# Patient Record
Sex: Female | Born: 1945 | Race: Black or African American | Hispanic: No | Marital: Married | Smoking: Former smoker
Health system: Southern US, Community
[De-identification: ages and names within clinical notes are randomized; demographics above are authoritative.]

## PROBLEM LIST (undated history)

## (undated) DIAGNOSIS — R42 Dizziness and giddiness: Secondary | ICD-10-CM

## (undated) DIAGNOSIS — H409 Unspecified glaucoma: Secondary | ICD-10-CM

## (undated) DIAGNOSIS — I340 Nonrheumatic mitral (valve) insufficiency: Secondary | ICD-10-CM

## (undated) DIAGNOSIS — K449 Diaphragmatic hernia without obstruction or gangrene: Secondary | ICD-10-CM

## (undated) DIAGNOSIS — K219 Gastro-esophageal reflux disease without esophagitis: Secondary | ICD-10-CM

## (undated) DIAGNOSIS — F411 Generalized anxiety disorder: Secondary | ICD-10-CM

## (undated) DIAGNOSIS — E785 Hyperlipidemia, unspecified: Secondary | ICD-10-CM

## (undated) DIAGNOSIS — I2510 Atherosclerotic heart disease of native coronary artery without angina pectoris: Secondary | ICD-10-CM

## (undated) DIAGNOSIS — I255 Ischemic cardiomyopathy: Secondary | ICD-10-CM

## (undated) DIAGNOSIS — I779 Disorder of arteries and arterioles, unspecified: Secondary | ICD-10-CM

## (undated) DIAGNOSIS — E119 Type 2 diabetes mellitus without complications: Secondary | ICD-10-CM

## (undated) DIAGNOSIS — D649 Anemia, unspecified: Secondary | ICD-10-CM

## (undated) DIAGNOSIS — I2699 Other pulmonary embolism without acute cor pulmonale: Secondary | ICD-10-CM

## (undated) DIAGNOSIS — M1990 Unspecified osteoarthritis, unspecified site: Secondary | ICD-10-CM

## (undated) DIAGNOSIS — M2550 Pain in unspecified joint: Secondary | ICD-10-CM

## (undated) DIAGNOSIS — J45909 Unspecified asthma, uncomplicated: Secondary | ICD-10-CM

## (undated) DIAGNOSIS — I10 Essential (primary) hypertension: Secondary | ICD-10-CM

## (undated) DIAGNOSIS — I4891 Unspecified atrial fibrillation: Secondary | ICD-10-CM

## (undated) DIAGNOSIS — I9789 Other postprocedural complications and disorders of the circulatory system, not elsewhere classified: Secondary | ICD-10-CM

## (undated) DIAGNOSIS — I739 Peripheral vascular disease, unspecified: Secondary | ICD-10-CM

## (undated) DIAGNOSIS — I519 Heart disease, unspecified: Secondary | ICD-10-CM

## (undated) HISTORY — PX: ROTATOR CUFF REPAIR: SHX139

## (undated) HISTORY — PX: EYE SURGERY: SHX253

## (undated) HISTORY — DX: Essential (primary) hypertension: I10

## (undated) HISTORY — PX: SHOULDER SURGERY: SHX246

## (undated) HISTORY — DX: Unspecified atrial fibrillation: I48.91

## (undated) HISTORY — DX: Diaphragmatic hernia without obstruction or gangrene: K44.9

## (undated) HISTORY — DX: Generalized anxiety disorder: F41.1

## (undated) HISTORY — DX: Atherosclerotic heart disease of native coronary artery without angina pectoris: I25.10

## (undated) HISTORY — DX: Heart disease, unspecified: I51.9

## (undated) HISTORY — DX: Disorder of arteries and arterioles, unspecified: I77.9

## (undated) HISTORY — DX: Other postprocedural complications and disorders of the circulatory system, not elsewhere classified: I97.89

## (undated) HISTORY — PX: TOTAL KNEE ARTHROPLASTY: SHX125

## (undated) HISTORY — PX: VAGINAL HYSTERECTOMY: SUR661

## (undated) HISTORY — DX: Ischemic cardiomyopathy: I25.5

## (undated) HISTORY — DX: Anemia, unspecified: D64.9

## (undated) HISTORY — DX: Type 2 diabetes mellitus without complications: E11.9

## (undated) HISTORY — DX: Peripheral vascular disease, unspecified: I73.9

## (undated) HISTORY — PX: HERNIA REPAIR: SHX51

---

## 2001-07-20 ENCOUNTER — Encounter: Payer: Self-pay | Admitting: Orthopedic Surgery

## 2001-07-22 ENCOUNTER — Inpatient Hospital Stay (HOSPITAL_COMMUNITY): Admission: RE | Admit: 2001-07-22 | Discharge: 2001-07-26 | Payer: Self-pay | Admitting: Orthopedic Surgery

## 2001-07-22 ENCOUNTER — Encounter: Payer: Self-pay | Admitting: Orthopedic Surgery

## 2003-01-31 ENCOUNTER — Encounter: Admission: RE | Admit: 2003-01-31 | Discharge: 2003-01-31 | Payer: Self-pay | Admitting: Orthopedic Surgery

## 2004-04-04 ENCOUNTER — Ambulatory Visit: Payer: Self-pay | Admitting: Orthopedic Surgery

## 2010-04-14 ENCOUNTER — Encounter: Payer: Self-pay | Admitting: Orthopedic Surgery

## 2012-09-11 ENCOUNTER — Encounter: Payer: Self-pay | Admitting: Cardiovascular Disease

## 2012-09-11 DIAGNOSIS — R079 Chest pain, unspecified: Secondary | ICD-10-CM

## 2012-09-13 ENCOUNTER — Encounter: Payer: Self-pay | Admitting: Cardiovascular Disease

## 2012-09-13 DIAGNOSIS — R072 Precordial pain: Secondary | ICD-10-CM

## 2012-09-13 DIAGNOSIS — I498 Other specified cardiac arrhythmias: Secondary | ICD-10-CM

## 2012-09-14 ENCOUNTER — Other Ambulatory Visit: Payer: Self-pay | Admitting: *Deleted

## 2012-09-14 ENCOUNTER — Encounter: Payer: Self-pay | Admitting: *Deleted

## 2012-09-14 DIAGNOSIS — R001 Bradycardia, unspecified: Secondary | ICD-10-CM

## 2012-09-14 DIAGNOSIS — R55 Syncope and collapse: Secondary | ICD-10-CM

## 2012-09-18 DIAGNOSIS — R55 Syncope and collapse: Secondary | ICD-10-CM

## 2012-09-18 DIAGNOSIS — I498 Other specified cardiac arrhythmias: Secondary | ICD-10-CM

## 2012-09-23 DIAGNOSIS — I059 Rheumatic mitral valve disease, unspecified: Secondary | ICD-10-CM

## 2012-10-21 ENCOUNTER — Encounter: Payer: No Typology Code available for payment source | Admitting: Physician Assistant

## 2012-11-24 ENCOUNTER — Ambulatory Visit: Payer: No Typology Code available for payment source | Admitting: Cardiovascular Disease

## 2014-03-25 HISTORY — PX: BACK SURGERY: SHX140

## 2014-04-21 DIAGNOSIS — Z23 Encounter for immunization: Secondary | ICD-10-CM | POA: Diagnosis not present

## 2014-05-23 DIAGNOSIS — I10 Essential (primary) hypertension: Secondary | ICD-10-CM | POA: Diagnosis not present

## 2014-05-23 DIAGNOSIS — M791 Myalgia: Secondary | ICD-10-CM | POA: Diagnosis not present

## 2014-05-23 DIAGNOSIS — E119 Type 2 diabetes mellitus without complications: Secondary | ICD-10-CM | POA: Diagnosis not present

## 2014-05-23 DIAGNOSIS — Z0000 Encounter for general adult medical examination without abnormal findings: Secondary | ICD-10-CM | POA: Diagnosis not present

## 2014-05-23 DIAGNOSIS — Z1389 Encounter for screening for other disorder: Secondary | ICD-10-CM | POA: Diagnosis not present

## 2014-05-25 DIAGNOSIS — E119 Type 2 diabetes mellitus without complications: Secondary | ICD-10-CM | POA: Diagnosis not present

## 2014-05-25 DIAGNOSIS — I10 Essential (primary) hypertension: Secondary | ICD-10-CM | POA: Diagnosis not present

## 2014-06-27 DIAGNOSIS — R1013 Epigastric pain: Secondary | ICD-10-CM | POA: Diagnosis not present

## 2014-06-27 DIAGNOSIS — M1990 Unspecified osteoarthritis, unspecified site: Secondary | ICD-10-CM | POA: Diagnosis not present

## 2014-06-27 DIAGNOSIS — Z96652 Presence of left artificial knee joint: Secondary | ICD-10-CM | POA: Diagnosis not present

## 2014-06-27 DIAGNOSIS — I10 Essential (primary) hypertension: Secondary | ICD-10-CM | POA: Diagnosis not present

## 2014-06-27 DIAGNOSIS — R109 Unspecified abdominal pain: Secondary | ICD-10-CM | POA: Diagnosis not present

## 2014-06-27 DIAGNOSIS — Z79899 Other long term (current) drug therapy: Secondary | ICD-10-CM | POA: Diagnosis not present

## 2014-06-27 DIAGNOSIS — K219 Gastro-esophageal reflux disease without esophagitis: Secondary | ICD-10-CM | POA: Diagnosis not present

## 2014-06-27 DIAGNOSIS — R110 Nausea: Secondary | ICD-10-CM | POA: Diagnosis not present

## 2014-06-27 DIAGNOSIS — R197 Diarrhea, unspecified: Secondary | ICD-10-CM | POA: Diagnosis not present

## 2014-06-27 DIAGNOSIS — Z87891 Personal history of nicotine dependence: Secondary | ICD-10-CM | POA: Diagnosis not present

## 2014-06-27 DIAGNOSIS — E119 Type 2 diabetes mellitus without complications: Secondary | ICD-10-CM | POA: Diagnosis not present

## 2014-06-27 DIAGNOSIS — R42 Dizziness and giddiness: Secondary | ICD-10-CM | POA: Diagnosis not present

## 2014-06-27 DIAGNOSIS — R0602 Shortness of breath: Secondary | ICD-10-CM | POA: Diagnosis not present

## 2014-07-22 DIAGNOSIS — E119 Type 2 diabetes mellitus without complications: Secondary | ICD-10-CM | POA: Diagnosis not present

## 2014-07-22 DIAGNOSIS — I10 Essential (primary) hypertension: Secondary | ICD-10-CM | POA: Diagnosis not present

## 2014-07-22 DIAGNOSIS — M545 Low back pain: Secondary | ICD-10-CM | POA: Diagnosis not present

## 2014-07-27 DIAGNOSIS — M4806 Spinal stenosis, lumbar region: Secondary | ICD-10-CM | POA: Diagnosis not present

## 2014-07-27 DIAGNOSIS — M9974 Connective tissue and disc stenosis of intervertebral foramina of sacral region: Secondary | ICD-10-CM | POA: Diagnosis not present

## 2014-07-27 DIAGNOSIS — M4608 Spinal enthesopathy, sacral and sacrococcygeal region: Secondary | ICD-10-CM | POA: Diagnosis not present

## 2014-07-27 DIAGNOSIS — M5136 Other intervertebral disc degeneration, lumbar region: Secondary | ICD-10-CM | POA: Diagnosis not present

## 2014-07-27 DIAGNOSIS — M5116 Intervertebral disc disorders with radiculopathy, lumbar region: Secondary | ICD-10-CM | POA: Diagnosis not present

## 2014-07-27 DIAGNOSIS — M47816 Spondylosis without myelopathy or radiculopathy, lumbar region: Secondary | ICD-10-CM | POA: Diagnosis not present

## 2014-07-27 DIAGNOSIS — M9973 Connective tissue and disc stenosis of intervertebral foramina of lumbar region: Secondary | ICD-10-CM | POA: Diagnosis not present

## 2014-07-27 DIAGNOSIS — M5117 Intervertebral disc disorders with radiculopathy, lumbosacral region: Secondary | ICD-10-CM | POA: Diagnosis not present

## 2014-08-08 DIAGNOSIS — I10 Essential (primary) hypertension: Secondary | ICD-10-CM | POA: Diagnosis not present

## 2014-08-08 DIAGNOSIS — M549 Dorsalgia, unspecified: Secondary | ICD-10-CM | POA: Diagnosis not present

## 2014-08-08 DIAGNOSIS — Z6832 Body mass index (BMI) 32.0-32.9, adult: Secondary | ICD-10-CM | POA: Diagnosis not present

## 2014-08-08 DIAGNOSIS — M5127 Other intervertebral disc displacement, lumbosacral region: Secondary | ICD-10-CM | POA: Diagnosis not present

## 2014-08-10 ENCOUNTER — Other Ambulatory Visit: Payer: Self-pay | Admitting: Neurosurgery

## 2014-08-10 DIAGNOSIS — M5126 Other intervertebral disc displacement, lumbar region: Secondary | ICD-10-CM

## 2014-08-25 ENCOUNTER — Ambulatory Visit
Admission: RE | Admit: 2014-08-25 | Discharge: 2014-08-25 | Disposition: A | Discharge status: Home / Self Care | Payer: Medicare Other | Source: Ambulatory Visit | Attending: Neurosurgery | Admitting: Neurosurgery

## 2014-08-25 DIAGNOSIS — M4316 Spondylolisthesis, lumbar region: Secondary | ICD-10-CM | POA: Diagnosis not present

## 2014-08-25 DIAGNOSIS — M5126 Other intervertebral disc displacement, lumbar region: Secondary | ICD-10-CM

## 2014-08-25 DIAGNOSIS — M47817 Spondylosis without myelopathy or radiculopathy, lumbosacral region: Secondary | ICD-10-CM | POA: Diagnosis not present

## 2014-08-25 DIAGNOSIS — M4317 Spondylolisthesis, lumbosacral region: Secondary | ICD-10-CM | POA: Diagnosis not present

## 2014-08-25 DIAGNOSIS — M5127 Other intervertebral disc displacement, lumbosacral region: Secondary | ICD-10-CM | POA: Diagnosis not present

## 2014-08-25 MED ORDER — DIAZEPAM 5 MG PO TABS
5.0000 mg | ORAL_TABLET | Freq: Once | ORAL | Status: AC
  Administered 2014-08-25: 5 mg via ORAL

## 2014-08-25 MED ORDER — MEPERIDINE HCL 100 MG/ML IJ SOLN
75.0000 mg | Freq: Once | INTRAMUSCULAR | Status: AC
  Administered 2014-08-25: 75 mg via INTRAMUSCULAR

## 2014-08-25 MED ORDER — IOHEXOL 180 MG/ML  SOLN
15.0000 mL | Freq: Once | INTRAMUSCULAR | Status: AC | PRN
  Administered 2014-08-25: 15 mL via INTRATHECAL

## 2014-08-25 MED ORDER — ONDANSETRON HCL 4 MG/2ML IJ SOLN
4.0000 mg | Freq: Once | INTRAMUSCULAR | Status: AC
  Administered 2014-08-25: 4 mg via INTRAMUSCULAR

## 2014-08-25 NOTE — Discharge Instructions (Signed)

## 2014-08-25 NOTE — Progress Notes (Signed)
Karen Tate at Dr. Harley Hallmark office notified patient is on her way from our office after her myelogram to their office, per Dr. Harley Hallmark instructions, to have post-myelo visit/discussion.  They know to find her a place to lie down once she gets there.  Brita Romp, RN

## 2014-09-07 ENCOUNTER — Other Ambulatory Visit: Payer: Self-pay | Admitting: Neurosurgery

## 2014-09-19 ENCOUNTER — Other Ambulatory Visit (HOSPITAL_COMMUNITY): Payer: Medicare Other | Admitting: *Deleted

## 2014-09-19 NOTE — Pre-Procedure Instructions (Addendum)
    Karen Tate  09/19/2014    Your procedure is scheduled on Wednesday, September 28, 2014.   Report to [facility] "A" Admitting Office at 9:30 AM.   Call this number if you have problems the morning of surgery: [phone]   Remember:  Do not eat food or drink liquids after midnight Tuesday, 09/27/14.  Take these medicines the morning of surgery with A SIP OF WATER: Hydralazine, Omeprazole, Amlodipine, Alprazolam, Meclizine (if needed)   STOP Fish Oil, Black Cohosh today   STOP/ Do not take Aspirin, Aleve, Naproxen, Advil, Ibuprofen, Motrin, Vitamins, Herbs, or Supplements starting today   Do not wear jewelry, make-up or nail polish.  Do not wear lotions, powders, or perfumes.  You may wear deodorant.  Do not shave 48 hours prior to surgery.    Do not bring valuables to the hospital.  [facility] is not responsible for any belongings or valuables.  Contacts, dentures or bridgework may not be worn into surgery.  Leave your suitcase in the car.  After surgery it may be brought to your room.  For patients admitted to the hospital, discharge time will be determined by your treatment team.  Special instructions:  See "Preparing for Surgery" Instruction sheet.   Please read over the following fact sheets that you were given. Pain Booklet, Coughing and Deep Breathing, Blood Transfusion Information, MRSA Information and Surgical Site Infection Prevention

## 2014-09-20 ENCOUNTER — Encounter (HOSPITAL_COMMUNITY)
Admission: RE | Admit: 2014-09-20 | Discharge: 2014-09-20 | Disposition: A | Discharge status: Home / Self Care | Payer: Medicare Other | Source: Ambulatory Visit | Attending: Neurosurgery | Admitting: Neurosurgery

## 2014-09-20 ENCOUNTER — Encounter (HOSPITAL_COMMUNITY): Payer: Self-pay

## 2014-09-20 DIAGNOSIS — Z0183 Encounter for blood typing: Secondary | ICD-10-CM | POA: Diagnosis not present

## 2014-09-20 DIAGNOSIS — I498 Other specified cardiac arrhythmias: Secondary | ICD-10-CM | POA: Diagnosis not present

## 2014-09-20 DIAGNOSIS — M4310 Spondylolisthesis, site unspecified: Secondary | ICD-10-CM | POA: Insufficient documentation

## 2014-09-20 DIAGNOSIS — Z01812 Encounter for preprocedural laboratory examination: Secondary | ICD-10-CM | POA: Diagnosis not present

## 2014-09-20 HISTORY — DX: Dizziness and giddiness: R42

## 2014-09-20 HISTORY — DX: Pain in unspecified joint: M25.50

## 2014-09-20 HISTORY — DX: Unspecified glaucoma: H40.9

## 2014-09-20 HISTORY — DX: Hyperlipidemia, unspecified: E78.5

## 2014-09-20 HISTORY — DX: Gastro-esophageal reflux disease without esophagitis: K21.9

## 2014-09-20 HISTORY — DX: Unspecified osteoarthritis, unspecified site: M19.90

## 2014-09-20 LAB — BASIC METABOLIC PANEL
Anion gap: 13 (ref 5–15)
BUN: 15 mg/dL (ref 6–20)
CO2: 24 mmol/L (ref 22–32)
Calcium: 10 mg/dL (ref 8.9–10.3)
Chloride: 102 mmol/L (ref 101–111)
Creatinine, Ser: 1.09 mg/dL — ABNORMAL HIGH (ref 0.44–1.00)
GFR calc Af Amer: 58 mL/min — ABNORMAL LOW (ref >60)
GFR calc non Af Amer: 50 mL/min — ABNORMAL LOW (ref >60)
Glucose, Bld: 111 mg/dL — ABNORMAL HIGH (ref 65–99)
Potassium: 3.9 mmol/L (ref 3.5–5.1)
Sodium: 139 mmol/L (ref 135–145)

## 2014-09-20 LAB — CBC
HCT: 38 % (ref 36.0–46.0)
Hemoglobin: 12.3 g/dL (ref 12.0–15.0)
MCH: 29.1 pg (ref 26.0–34.0)
MCHC: 32.4 g/dL (ref 30.0–36.0)
MCV: 90 fL (ref 78.0–100.0)
Platelets: 340 10*3/uL (ref 150–400)
RBC: 4.22 MIL/uL (ref 3.87–5.11)
RDW: 14.6 % (ref 11.5–15.5)
WBC: 6.3 10*3/uL (ref 4.0–10.5)

## 2014-09-20 LAB — ABO/RH: ABO/RH(D): A POS

## 2014-09-20 LAB — TYPE AND SCREEN
ABO/RH(D): A POS
Antibody Screen: NEGATIVE

## 2014-09-20 LAB — GLUCOSE, CAPILLARY: Glucose-Capillary: 90 mg/dL (ref 65–99)

## 2014-09-20 LAB — SURGICAL PCR SCREEN
MRSA, PCR: NEGATIVE
Staphylococcus aureus: NEGATIVE

## 2014-09-20 NOTE — Progress Notes (Signed)
Patient denies CP, shob, reports cardiac test done at [facility] done 2014 when her heart rate dropped into the 40's determined to be caused by a combination of beta blocker an calcium channel blocker. Records on chart under media tab. Patient reports no further cardiology visit/ concerns. PCP Dr. Sherrie Sport in [place]. Last A1C in early March per office. A1C ordered.

## 2014-09-20 NOTE — Progress Notes (Signed)
Left message with Janett Billow requested that orders be second signed.

## 2014-09-21 LAB — HEMOGLOBIN A1C
Hgb A1c MFr Bld: 6.9 % — ABNORMAL HIGH (ref 4.8–5.6)
Mean Plasma Glucose: 151 mg/dL

## 2014-09-23 DIAGNOSIS — I10 Essential (primary) hypertension: Secondary | ICD-10-CM | POA: Diagnosis not present

## 2014-09-23 DIAGNOSIS — E119 Type 2 diabetes mellitus without complications: Secondary | ICD-10-CM | POA: Diagnosis not present

## 2014-09-23 DIAGNOSIS — E11610 Type 2 diabetes mellitus with diabetic neuropathic arthropathy: Secondary | ICD-10-CM | POA: Diagnosis not present

## 2014-09-23 DIAGNOSIS — M545 Low back pain: Secondary | ICD-10-CM | POA: Diagnosis not present

## 2014-09-27 NOTE — H&P (Signed)
Karen Tate is an 69 y.o. female.   Chief Complaint:numbnee in left leg HPI: patient who after left knee surgery developed numbness of the left leg from the knee down associated with pain and weakness. Conservative treatment has not helped. Rays showed spondylolisthesis at l45 and l5s1.  She wants to go ahead with surgery since she is getting worse  Past Medical History  Diagnosis Date  . Type 2 diabetes mellitus   . Hypertension   . Hiatal hernia   . Anxiety neurosis   . Hyperlipemia   . Dizziness   . Arthritis   . Joint pain   . GERD (gastroesophageal reflux disease)   . Glaucoma     Past Surgical History  Procedure Laterality Date  . Vaginal hysterectomy    . Hernia repair    . Total knee arthroplasty      LEFT  . Shoulder surgery      LEFT    Family History  Problem Relation Age of Onset  . Diabetes Father   . Breast cancer Mother    Social History:  reports that she quit smoking about 36 years ago. Her smoking use included Cigarettes. She has a 20 pack-year smoking history. She does not have any smokeless tobacco history on file. She reports that she does not drink alcohol or use illicit drugs.  Allergies:  Allergies  Allergen Reactions  . Beta Adrenergic Blockers Other (See Comments)    Dropped heart rate to the 40's  . Calcium Channel Blockers Other (See Comments)    Dropped HR into the 40's  . Naproxen Hives    No prescriptions prior to admission    No results found for this or any previous visit (from the past 48 hour(s)). No results found.  Review of Systems  HENT: Negative.   Eyes: Negative.   Respiratory: Negative.   Cardiovascular: Positive for leg swelling.       Artrerial hypertension  Gastrointestinal: Negative.   Genitourinary: Negative.   Musculoskeletal: Positive for back pain.  Skin: Negative.   Neurological: Positive for sensory change and focal weakness.  Endo/Heme/Allergies: Negative.   Psychiatric/Behavioral: Negative.      There were no vitals taken for this visit. Physical Exam hent,nl. Neck, nl. Cv, nl. Lungs, clear. Abdomen,soft. Extremities, nl. NEURO weakness of df and pf left foot. SLR in left positive at 60 degrees. Sensory, numbness between left knee and ankle.  Myelogram of the lumbar areas showed spondylolisthesis at l45, l5-s1.   Assessment/Plan Patient wants to go ahead with surgery which will be decompression and fusion from l4 to s1. She is aware of benefits and risks  Chrystina Naff M 09/27/2014, 11:43 AM

## 2014-09-28 ENCOUNTER — Inpatient Hospital Stay (HOSPITAL_COMMUNITY): Payer: Medicare Other

## 2014-09-28 ENCOUNTER — Inpatient Hospital Stay (HOSPITAL_COMMUNITY)
Admission: RE | Admit: 2014-09-28 | Discharge: 2014-10-02 | DRG: 460 | Disposition: A | Discharge status: Home Health | Payer: Medicare Other | Source: Ambulatory Visit | Attending: Neurosurgery | Admitting: Neurosurgery

## 2014-09-28 ENCOUNTER — Inpatient Hospital Stay (HOSPITAL_COMMUNITY): Payer: Medicare Other | Admitting: Certified Registered Nurse Anesthetist

## 2014-09-28 ENCOUNTER — Encounter (HOSPITAL_COMMUNITY)
Admission: RE | Disposition: A | Discharge status: Home Health | Payer: Medicare Other | Source: Ambulatory Visit | Attending: Neurosurgery

## 2014-09-28 ENCOUNTER — Encounter (HOSPITAL_COMMUNITY): Payer: Self-pay | Admitting: Anesthesiology

## 2014-09-28 DIAGNOSIS — M5137 Other intervertebral disc degeneration, lumbosacral region: Secondary | ICD-10-CM | POA: Diagnosis not present

## 2014-09-28 DIAGNOSIS — Z419 Encounter for procedure for purposes other than remedying health state, unspecified: Secondary | ICD-10-CM

## 2014-09-28 DIAGNOSIS — Z96652 Presence of left artificial knee joint: Secondary | ICD-10-CM | POA: Diagnosis present

## 2014-09-28 DIAGNOSIS — J449 Chronic obstructive pulmonary disease, unspecified: Secondary | ICD-10-CM | POA: Diagnosis present

## 2014-09-28 DIAGNOSIS — K219 Gastro-esophageal reflux disease without esophagitis: Secondary | ICD-10-CM | POA: Diagnosis present

## 2014-09-28 DIAGNOSIS — I10 Essential (primary) hypertension: Secondary | ICD-10-CM | POA: Diagnosis present

## 2014-09-28 DIAGNOSIS — Z87891 Personal history of nicotine dependence: Secondary | ICD-10-CM

## 2014-09-28 DIAGNOSIS — E785 Hyperlipidemia, unspecified: Secondary | ICD-10-CM | POA: Diagnosis present

## 2014-09-28 DIAGNOSIS — M4317 Spondylolisthesis, lumbosacral region: Secondary | ICD-10-CM | POA: Diagnosis present

## 2014-09-28 DIAGNOSIS — M5117 Intervertebral disc disorders with radiculopathy, lumbosacral region: Secondary | ICD-10-CM | POA: Diagnosis present

## 2014-09-28 DIAGNOSIS — M1990 Unspecified osteoarthritis, unspecified site: Secondary | ICD-10-CM | POA: Diagnosis not present

## 2014-09-28 DIAGNOSIS — K5900 Constipation, unspecified: Secondary | ICD-10-CM | POA: Diagnosis not present

## 2014-09-28 DIAGNOSIS — M4316 Spondylolisthesis, lumbar region: Secondary | ICD-10-CM | POA: Diagnosis not present

## 2014-09-28 DIAGNOSIS — R200 Anesthesia of skin: Secondary | ICD-10-CM | POA: Diagnosis not present

## 2014-09-28 DIAGNOSIS — M47816 Spondylosis without myelopathy or radiculopathy, lumbar region: Secondary | ICD-10-CM | POA: Diagnosis not present

## 2014-09-28 DIAGNOSIS — M5417 Radiculopathy, lumbosacral region: Secondary | ICD-10-CM | POA: Diagnosis not present

## 2014-09-28 DIAGNOSIS — M5116 Intervertebral disc disorders with radiculopathy, lumbar region: Secondary | ICD-10-CM | POA: Diagnosis present

## 2014-09-28 DIAGNOSIS — E119 Type 2 diabetes mellitus without complications: Secondary | ICD-10-CM | POA: Diagnosis present

## 2014-09-28 DIAGNOSIS — Z981 Arthrodesis status: Secondary | ICD-10-CM | POA: Diagnosis not present

## 2014-09-28 LAB — GLUCOSE, CAPILLARY
Glucose-Capillary: 132 mg/dL — ABNORMAL HIGH (ref 65–99)
Glucose-Capillary: 188 mg/dL — ABNORMAL HIGH (ref 65–99)

## 2014-09-28 SURGERY — POSTERIOR LUMBAR FUSION 2 LEVEL
Anesthesia: General

## 2014-09-28 MED ORDER — NALOXONE HCL 0.4 MG/ML IJ SOLN: 0.4000 mg | INTRAMUSCULAR | Status: DC | PRN

## 2014-09-28 MED ORDER — ONDANSETRON HCL 4 MG/2ML IJ SOLN
INTRAMUSCULAR | Status: DC | PRN
  Administered 2014-09-28: 4 mg via INTRAVENOUS

## 2014-09-28 MED ORDER — DIAZEPAM 5 MG PO TABS
ORAL_TABLET | ORAL | Status: AC
  Filled 2014-09-28: qty 1

## 2014-09-28 MED ORDER — MENTHOL 3 MG MT LOZG: 1.0000 | LOZENGE | OROMUCOSAL | Status: DC | PRN

## 2014-09-28 MED ORDER — ROCURONIUM BROMIDE 100 MG/10ML IV SOLN
INTRAVENOUS | Status: DC | PRN
  Administered 2014-09-28: 50 mg via INTRAVENOUS

## 2014-09-28 MED ORDER — INSULIN ASPART 100 UNIT/ML ~~LOC~~ SOLN: 0.0000 [IU] | Freq: Every day | SUBCUTANEOUS | Status: DC

## 2014-09-28 MED ORDER — PROMETHAZINE HCL 25 MG/ML IJ SOLN
6.2500 mg | INTRAMUSCULAR | Status: DC | PRN
Start: 2014-09-28 — End: 2014-09-28

## 2014-09-28 MED ORDER — AMLODIPINE BESYLATE 5 MG PO TABS
5.0000 mg | ORAL_TABLET | Freq: Every day | ORAL | Status: DC
  Administered 2014-09-29 – 2014-10-02 (×4): 5 mg via ORAL
  Filled 2014-09-28 (×4): qty 1

## 2014-09-28 MED ORDER — ARTIFICIAL TEARS OP OINT
TOPICAL_OINTMENT | OPHTHALMIC | Status: AC
  Filled 2014-09-28: qty 3.5

## 2014-09-28 MED ORDER — HYDRALAZINE HCL 25 MG PO TABS
25.0000 mg | ORAL_TABLET | Freq: Three times a day (TID) | ORAL | Status: DC
Start: 2014-09-28 — End: 2014-10-02
  Administered 2014-09-28 – 2014-10-02 (×11): 25 mg via ORAL
  Filled 2014-09-28 (×11): qty 1

## 2014-09-28 MED ORDER — PHENYLEPHRINE HCL 10 MG/ML IJ SOLN
10.0000 mg | INTRAVENOUS | Status: DC | PRN
  Administered 2014-09-28: 10 ug/min via INTRAVENOUS

## 2014-09-28 MED ORDER — HYDROMORPHONE HCL 1 MG/ML IJ SOLN
INTRAMUSCULAR | Status: AC
  Administered 2014-09-28: 0.5 mg via INTRAVENOUS
  Filled 2014-09-28: qty 1

## 2014-09-28 MED ORDER — VANCOMYCIN HCL 1000 MG IV SOLR
INTRAVENOUS | Status: AC
  Filled 2014-09-28: qty 2000

## 2014-09-28 MED ORDER — CEFAZOLIN SODIUM 1-5 GM-% IV SOLN
1.0000 g | Freq: Three times a day (TID) | INTRAVENOUS | Status: AC
  Administered 2014-09-28 – 2014-09-29 (×2): 1 g via INTRAVENOUS
  Filled 2014-09-28 (×2): qty 50

## 2014-09-28 MED ORDER — DIPHENHYDRAMINE HCL 50 MG/ML IJ SOLN: 12.5000 mg | Freq: Four times a day (QID) | INTRAMUSCULAR | Status: DC | PRN

## 2014-09-28 MED ORDER — FENTANYL CITRATE (PF) 250 MCG/5ML IJ SOLN
INTRAMUSCULAR | Status: AC
  Filled 2014-09-28: qty 5

## 2014-09-28 MED ORDER — HYDROMORPHONE HCL 1 MG/ML IJ SOLN
0.2500 mg | INTRAMUSCULAR | Status: DC | PRN
  Administered 2014-09-28 (×4): 0.5 mg via INTRAVENOUS

## 2014-09-28 MED ORDER — VECURONIUM BROMIDE 10 MG IV SOLR
INTRAVENOUS | Status: AC
  Filled 2014-09-28: qty 10

## 2014-09-28 MED ORDER — VANCOMYCIN HCL 1000 MG IV SOLR
INTRAVENOUS | Status: DC | PRN
  Administered 2014-09-28: 2000 mg via TOPICAL

## 2014-09-28 MED ORDER — HYDROCHLOROTHIAZIDE 25 MG PO TABS
25.0000 mg | ORAL_TABLET | Freq: Every day | ORAL | Status: DC
  Administered 2014-09-29 – 2014-10-02 (×4): 25 mg via ORAL
  Filled 2014-09-28 (×4): qty 1

## 2014-09-28 MED ORDER — MORPHINE SULFATE 1 MG/ML IV SOLN
INTRAVENOUS | Status: DC
  Administered 2014-09-28: 1.5 mg via INTRAVENOUS
  Administered 2014-09-28: 19:00:00 via INTRAVENOUS
  Administered 2014-09-29: 0 mg via INTRAVENOUS
  Administered 2014-09-29: 9 mg via INTRAVENOUS
  Administered 2014-09-29: 0 mg via INTRAVENOUS
  Administered 2014-09-29: 1.5 mg via INTRAVENOUS
  Administered 2014-09-30: 0 mg via INTRAVENOUS
  Filled 2014-09-28: qty 25

## 2014-09-28 MED ORDER — PROPOFOL 10 MG/ML IV BOLUS
INTRAVENOUS | Status: DC | PRN
  Administered 2014-09-28: 150 mg via INTRAVENOUS

## 2014-09-28 MED ORDER — THROMBIN 20000 UNITS EX SOLR
CUTANEOUS | Status: DC | PRN
  Administered 2014-09-28: 14:00:00 via TOPICAL

## 2014-09-28 MED ORDER — 0.9 % SODIUM CHLORIDE (POUR BTL) OPTIME
TOPICAL | Status: DC | PRN
  Administered 2014-09-28: 1000 mL

## 2014-09-28 MED ORDER — PHENOL 1.4 % MT LIQD: 1.0000 | OROMUCOSAL | Status: DC | PRN

## 2014-09-28 MED ORDER — FENTANYL CITRATE (PF) 100 MCG/2ML IJ SOLN
INTRAMUSCULAR | Status: DC | PRN
  Administered 2014-09-28: 200 ug via INTRAVENOUS
  Administered 2014-09-28: 150 ug via INTRAVENOUS
  Administered 2014-09-28: 100 ug via INTRAVENOUS
  Administered 2014-09-28: 50 ug via INTRAVENOUS

## 2014-09-28 MED ORDER — LIDOCAINE HCL 4 % MT SOLN
OROMUCOSAL | Status: DC | PRN
  Administered 2014-09-28: 3 mL via TOPICAL

## 2014-09-28 MED ORDER — BUPIVACAINE LIPOSOME 1.3 % IJ SUSP
20.0000 mL | INTRAMUSCULAR | Status: DC
  Filled 2014-09-28: qty 20

## 2014-09-28 MED ORDER — LACTATED RINGERS IV SOLN
INTRAVENOUS | Status: DC | PRN
  Administered 2014-09-28 (×3): via INTRAVENOUS

## 2014-09-28 MED ORDER — LIDOCAINE HCL (CARDIAC) 20 MG/ML IV SOLN
INTRAVENOUS | Status: DC | PRN
  Administered 2014-09-28: 20 mg via INTRAVENOUS

## 2014-09-28 MED ORDER — SODIUM CHLORIDE 0.9 % IV SOLN
INTRAVENOUS | Status: DC
  Administered 2014-09-28: 23:00:00 via INTRAVENOUS

## 2014-09-28 MED ORDER — ACETAMINOPHEN 325 MG PO TABS: 650.0000 mg | ORAL_TABLET | ORAL | Status: DC | PRN

## 2014-09-28 MED ORDER — INSULIN ASPART 100 UNIT/ML ~~LOC~~ SOLN
0.0000 [IU] | Freq: Three times a day (TID) | SUBCUTANEOUS | Status: DC
  Administered 2014-09-29: 7 [IU] via SUBCUTANEOUS
  Administered 2014-09-29 – 2014-09-30 (×3): 4 [IU] via SUBCUTANEOUS
  Administered 2014-09-30 (×2): 3 [IU] via SUBCUTANEOUS
  Administered 2014-10-01: 4 [IU] via SUBCUTANEOUS
  Administered 2014-10-01 – 2014-10-02 (×3): 3 [IU] via SUBCUTANEOUS

## 2014-09-28 MED ORDER — ALBUMIN HUMAN 5 % IV SOLN
INTRAVENOUS | Status: DC | PRN
  Administered 2014-09-28: 17:00:00 via INTRAVENOUS

## 2014-09-28 MED ORDER — MIDAZOLAM HCL 2 MG/2ML IJ SOLN: 0.5000 mg | Freq: Once | INTRAMUSCULAR | Status: DC | PRN

## 2014-09-28 MED ORDER — DEXAMETHASONE SODIUM PHOSPHATE 4 MG/ML IJ SOLN
INTRAMUSCULAR | Status: AC
  Filled 2014-09-28: qty 2

## 2014-09-28 MED ORDER — GLIPIZIDE 5 MG PO TABS
5.0000 mg | ORAL_TABLET | Freq: Two times a day (BID) | ORAL | Status: DC
  Administered 2014-09-29 – 2014-10-02 (×7): 5 mg via ORAL
  Filled 2014-09-28 (×7): qty 1

## 2014-09-28 MED ORDER — OXYCODONE-ACETAMINOPHEN 5-325 MG PO TABS
ORAL_TABLET | ORAL | Status: AC
  Filled 2014-09-28: qty 2

## 2014-09-28 MED ORDER — NEOSTIGMINE METHYLSULFATE 10 MG/10ML IV SOLN
INTRAVENOUS | Status: DC | PRN
  Administered 2014-09-28: 4 mg via INTRAVENOUS

## 2014-09-28 MED ORDER — SODIUM CHLORIDE 0.9 % IJ SOLN
3.0000 mL | Freq: Two times a day (BID) | INTRAMUSCULAR | Status: DC
  Administered 2014-09-29 – 2014-10-01 (×5): 3 mL via INTRAVENOUS

## 2014-09-28 MED ORDER — CEFAZOLIN SODIUM-DEXTROSE 2-3 GM-% IV SOLR
INTRAVENOUS | Status: AC
  Filled 2014-09-28: qty 50

## 2014-09-28 MED ORDER — MIDAZOLAM HCL 2 MG/2ML IJ SOLN
INTRAMUSCULAR | Status: AC
  Filled 2014-09-28: qty 2

## 2014-09-28 MED ORDER — BENAZEPRIL HCL 20 MG PO TABS
20.0000 mg | ORAL_TABLET | Freq: Every day | ORAL | Status: DC
  Administered 2014-09-29 – 2014-10-02 (×4): 20 mg via ORAL
  Filled 2014-09-28 (×5): qty 1

## 2014-09-28 MED ORDER — LACTATED RINGERS IV SOLN
INTRAVENOUS | Status: DC
  Administered 2014-09-28: 11:00:00 via INTRAVENOUS

## 2014-09-28 MED ORDER — ACETAMINOPHEN 650 MG RE SUPP: 650.0000 mg | RECTAL | Status: DC | PRN

## 2014-09-28 MED ORDER — PROPOFOL 10 MG/ML IV BOLUS
INTRAVENOUS | Status: AC
  Filled 2014-09-28: qty 20

## 2014-09-28 MED ORDER — HYDRALAZINE HCL 20 MG/ML IJ SOLN
INTRAMUSCULAR | Status: AC
  Filled 2014-09-28: qty 1

## 2014-09-28 MED ORDER — ONDANSETRON HCL 4 MG/2ML IJ SOLN
INTRAMUSCULAR | Status: AC
  Filled 2014-09-28: qty 2

## 2014-09-28 MED ORDER — MEPERIDINE HCL 25 MG/ML IJ SOLN: 6.2500 mg | INTRAMUSCULAR | Status: DC | PRN

## 2014-09-28 MED ORDER — ONDANSETRON HCL 4 MG/2ML IJ SOLN: 4.0000 mg | INTRAMUSCULAR | Status: DC | PRN

## 2014-09-28 MED ORDER — VECURONIUM BROMIDE 10 MG IV SOLR
INTRAVENOUS | Status: DC | PRN
  Administered 2014-09-28 (×2): 2 mg via INTRAVENOUS
  Administered 2014-09-28: 3 mg via INTRAVENOUS
  Administered 2014-09-28: 1 mg via INTRAVENOUS

## 2014-09-28 MED ORDER — DIAZEPAM 5 MG PO TABS
5.0000 mg | ORAL_TABLET | Freq: Four times a day (QID) | ORAL | Status: DC | PRN
  Administered 2014-09-28 – 2014-10-02 (×6): 5 mg via ORAL
  Filled 2014-09-28 (×5): qty 1

## 2014-09-28 MED ORDER — GLYCOPYRROLATE 0.2 MG/ML IJ SOLN
INTRAMUSCULAR | Status: DC | PRN
Start: 2014-09-28 — End: 2014-09-28
  Administered 2014-09-28: 0.6 mg via INTRAVENOUS

## 2014-09-28 MED ORDER — ONDANSETRON HCL 4 MG/2ML IJ SOLN: 4.0000 mg | Freq: Four times a day (QID) | INTRAMUSCULAR | Status: DC | PRN

## 2014-09-28 MED ORDER — OXYCODONE-ACETAMINOPHEN 5-325 MG PO TABS
2.0000 | ORAL_TABLET | ORAL | Status: DC | PRN
  Administered 2014-09-28 – 2014-10-02 (×17): 2 via ORAL
  Filled 2014-09-28 (×17): qty 2

## 2014-09-28 MED ORDER — PHENYLEPHRINE HCL 10 MG/ML IJ SOLN
INTRAMUSCULAR | Status: DC | PRN
  Administered 2014-09-28 (×2): 80 ug via INTRAVENOUS

## 2014-09-28 MED ORDER — CEFAZOLIN SODIUM-DEXTROSE 2-3 GM-% IV SOLR
INTRAVENOUS | Status: DC | PRN
  Administered 2014-09-28: 2 g via INTRAVENOUS

## 2014-09-28 MED ORDER — DIPHENHYDRAMINE HCL 12.5 MG/5ML PO ELIX: 12.5000 mg | ORAL_SOLUTION | Freq: Four times a day (QID) | ORAL | Status: DC | PRN

## 2014-09-28 MED ORDER — ROCURONIUM BROMIDE 50 MG/5ML IV SOLN
INTRAVENOUS | Status: AC
  Filled 2014-09-28: qty 1

## 2014-09-28 MED ORDER — SODIUM CHLORIDE 0.9 % IJ SOLN: 9.0000 mL | INTRAMUSCULAR | Status: DC | PRN

## 2014-09-28 MED ORDER — SODIUM CHLORIDE 0.9 % IJ SOLN: 3.0000 mL | INTRAMUSCULAR | Status: DC | PRN

## 2014-09-28 MED ORDER — MIDAZOLAM HCL 5 MG/5ML IJ SOLN
INTRAMUSCULAR | Status: DC | PRN
  Administered 2014-09-28: 2 mg via INTRAVENOUS

## 2014-09-28 MED ORDER — ZOLPIDEM TARTRATE 5 MG PO TABS: 5.0000 mg | ORAL_TABLET | Freq: Every evening | ORAL | Status: DC | PRN

## 2014-09-28 MED ORDER — MORPHINE SULFATE 1 MG/ML IV SOLN
INTRAVENOUS | Status: AC
  Filled 2014-09-28: qty 25

## 2014-09-28 MED ORDER — SENNOSIDES-DOCUSATE SODIUM 8.6-50 MG PO TABS: 1.0000 | ORAL_TABLET | Freq: Every evening | ORAL | Status: DC | PRN

## 2014-09-28 MED ORDER — MECLIZINE HCL 25 MG PO TABS
25.0000 mg | ORAL_TABLET | Freq: Three times a day (TID) | ORAL | Status: DC | PRN
  Filled 2014-09-28: qty 1

## 2014-09-28 MED ORDER — SODIUM CHLORIDE 0.9 % IV SOLN: 250.0000 mL | INTRAVENOUS | Status: DC

## 2014-09-28 MED ORDER — METFORMIN HCL 500 MG PO TABS
1000.0000 mg | ORAL_TABLET | Freq: Two times a day (BID) | ORAL | Status: DC
  Administered 2014-09-29 – 2014-10-02 (×7): 1000 mg via ORAL
  Filled 2014-09-28 (×7): qty 2

## 2014-09-28 SURGICAL SUPPLY — 73 items
APL SKNCLS STERI-STRIP NONHPOA (GAUZE/BANDAGES/DRESSINGS) ×1
BENZOIN TINCTURE PRP APPL 2/3 (GAUZE/BANDAGES/DRESSINGS) ×2 IMPLANT
BLADE CLIPPER SURG (BLADE) IMPLANT
BUR ACORN 6.0 (BURR) ×2 IMPLANT
BUR MATCHSTICK NEURO 3.0 LAGG (BURR) ×2 IMPLANT
CANISTER SUCT 3000ML PPV (MISCELLANEOUS) ×2 IMPLANT
CAP LOCKING THREADED (Cap) ×6 IMPLANT
CONT SPEC 4OZ CLIKSEAL STRL BL (MISCELLANEOUS) ×2 IMPLANT
COVER BACK TABLE 60X90IN (DRAPES) ×2 IMPLANT
CROSSLINK SPINAL FUSION (Cage) ×1 IMPLANT
DRAPE C-ARM 42X72 X-RAY (DRAPES) ×4 IMPLANT
DRAPE LAPAROTOMY 100X72X124 (DRAPES) ×2 IMPLANT
DRAPE POUCH INSTRU U-SHP 10X18 (DRAPES) ×2 IMPLANT
DRSG PAD ABDOMINAL 8X10 ST (GAUZE/BANDAGES/DRESSINGS) IMPLANT
DURAPREP 26ML APPLICATOR (WOUND CARE) ×2 IMPLANT
ELECT BLADE 4.0 EZ CLEAN MEGAD (MISCELLANEOUS) ×4
ELECT REM PT RETURN 9FT ADLT (ELECTROSURGICAL) ×2
ELECTRODE BLDE 4.0 EZ CLN MEGD (MISCELLANEOUS) IMPLANT
ELECTRODE REM PT RTRN 9FT ADLT (ELECTROSURGICAL) ×1 IMPLANT
EVACUATOR 1/8 PVC DRAIN (DRAIN) IMPLANT
GAUZE SPONGE 4X4 12PLY STRL (GAUZE/BANDAGES/DRESSINGS) ×2 IMPLANT
GAUZE SPONGE 4X4 16PLY XRAY LF (GAUZE/BANDAGES/DRESSINGS) ×2 IMPLANT
GLOVE BIO SURGEON STRL SZ8 (GLOVE) ×1 IMPLANT
GLOVE BIOGEL M 8.0 STRL (GLOVE) ×2 IMPLANT
GLOVE BIOGEL PI IND STRL 8.5 (GLOVE) IMPLANT
GLOVE BIOGEL PI INDICATOR 8.5 (GLOVE) ×4
GLOVE ECLIPSE 7.5 STRL STRAW (GLOVE) ×4 IMPLANT
GLOVE EXAM NITRILE LRG STRL (GLOVE) IMPLANT
GLOVE EXAM NITRILE MD LF STRL (GLOVE) IMPLANT
GLOVE EXAM NITRILE XL STR (GLOVE) IMPLANT
GLOVE EXAM NITRILE XS STR PU (GLOVE) IMPLANT
GOWN STRL REUS W/ TWL LRG LVL3 (GOWN DISPOSABLE) ×1 IMPLANT
GOWN STRL REUS W/ TWL XL LVL3 (GOWN DISPOSABLE) IMPLANT
GOWN STRL REUS W/TWL 2XL LVL3 (GOWN DISPOSABLE) IMPLANT
GOWN STRL REUS W/TWL LRG LVL3 (GOWN DISPOSABLE) ×2
GOWN STRL REUS W/TWL XL LVL3 (GOWN DISPOSABLE) ×6
IMPLANT RISE 11X17-8MM SPINE (Neuro Prosthesis/Implant) ×1 IMPLANT
IMPLANT RISE 8X22 9-15MM (Neuro Prosthesis/Implant) ×2 IMPLANT
KIT BASIN OR (CUSTOM PROCEDURE TRAY) ×2 IMPLANT
KIT INFUSE MEDIUM (Orthopedic Implant) ×1 IMPLANT
KIT ROOM TURNOVER OR (KITS) ×2 IMPLANT
MILL MEDIUM DISP (BLADE) ×1 IMPLANT
NDL HYPO 18GX1.5 BLUNT FILL (NEEDLE) IMPLANT
NDL HYPO 21X1.5 SAFETY (NEEDLE) IMPLANT
NDL HYPO 25X1 1.5 SAFETY (NEEDLE) IMPLANT
NEEDLE HYPO 18GX1.5 BLUNT FILL (NEEDLE) IMPLANT
NEEDLE HYPO 21X1.5 SAFETY (NEEDLE) IMPLANT
NEEDLE HYPO 25X1 1.5 SAFETY (NEEDLE) IMPLANT
NS IRRIG 1000ML POUR BTL (IV SOLUTION) ×2 IMPLANT
PACK LAMINECTOMY NEURO (CUSTOM PROCEDURE TRAY) ×2 IMPLANT
PAD ARMBOARD 7.5X6 YLW CONV (MISCELLANEOUS) ×6 IMPLANT
PATTIES SURGICAL .5 X1 (DISPOSABLE) ×2 IMPLANT
PATTIES SURGICAL .5 X3 (DISPOSABLE) IMPLANT
ROD 75MM SPINAL (Rod) ×2 IMPLANT
SCREW CREO 5.5X50MM (Screw) ×1 IMPLANT
SCREW SPINE 40X5.5XPA CREO (Screw) IMPLANT
SCREW SPINE CREO 5.5X40 (Screw) ×10 IMPLANT
SPONGE LAP 4X18 X RAY DECT (DISPOSABLE) IMPLANT
SPONGE NEURO XRAY DETECT 1X3 (DISPOSABLE) IMPLANT
SPONGE SURGIFOAM ABS GEL 100 (HEMOSTASIS) ×2 IMPLANT
STRIP CLOSURE SKIN 1/2X4 (GAUZE/BANDAGES/DRESSINGS) ×2 IMPLANT
SUT VIC AB 1 CT1 18XBRD ANBCTR (SUTURE) ×2 IMPLANT
SUT VIC AB 1 CT1 8-18 (SUTURE) ×4
SUT VIC AB 2-0 CP2 18 (SUTURE) ×2 IMPLANT
SUT VIC AB 3-0 SH 8-18 (SUTURE) ×2 IMPLANT
SYR 20CC LL (SYRINGE) IMPLANT
SYR 20ML ECCENTRIC (SYRINGE) ×2 IMPLANT
SYR 5ML LL (SYRINGE) IMPLANT
TAPE CLOTH SURG 4X10 WHT LF (GAUZE/BANDAGES/DRESSINGS) ×1 IMPLANT
TOWEL OR 17X24 6PK STRL BLUE (TOWEL DISPOSABLE) ×2 IMPLANT
TOWEL OR 17X26 10 PK STRL BLUE (TOWEL DISPOSABLE) ×2 IMPLANT
TRAY FOLEY W/METER SILVER 14FR (SET/KITS/TRAYS/PACK) ×2 IMPLANT
WATER STERILE IRR 1000ML POUR (IV SOLUTION) ×2 IMPLANT

## 2014-09-28 NOTE — Anesthesia Postprocedure Evaluation (Signed)
Anesthesia Post Note  Patient: Karen Tate  Procedure(s) Performed: Procedure(s) (LRB): BILATERAL LUMBAR FIVE-SACRAL ONE LUMBAR FUSION WITH SCREWS (N/A)  Anesthesia type: general  Patient location: PACU  Post pain: Pain level controlled  Post assessment: Patient's Cardiovascular Status Stable  Last Vitals:  Filed Vitals:   09/28/14 1930  BP:   Pulse: 81  Temp:   Resp: 19    Post vital signs: Reviewed and stable  Level of consciousness: sedated  Complications: No apparent anesthesia complications

## 2014-09-28 NOTE — Anesthesia Procedure Notes (Signed)
Procedure Name: Intubation Date/Time: 09/28/2014 2:11 PM Performed by: Susa Loffler Pre-anesthesia Checklist: Patient identified, Timeout performed, Emergency Drugs available, Suction available and Patient being monitored Patient Re-evaluated:Patient Re-evaluated prior to inductionOxygen Delivery Method: Circle system utilized Preoxygenation: Pre-oxygenation with 100% oxygen Intubation Type: IV induction Ventilation: Mask ventilation without difficulty and Oral airway inserted - appropriate to patient size Laryngoscope Size: Mac and 3 Grade View: Grade II Tube type: Oral Tube size: 7.0 mm Number of attempts: 1 Airway Equipment and Method: Stylet,  Oral airway and LTA kit utilized Placement Confirmation: ETT inserted through vocal cords under direct vision,  positive ETCO2 and breath sounds checked- equal and bilateral Secured at: 22 cm Tube secured with: Tape Dental Injury: Teeth and Oropharynx as per pre-operative assessment

## 2014-09-28 NOTE — Transfer of Care (Signed)
Immediate Anesthesia Transfer of Care Note  Patient: Karen Tate  Procedure(s) Performed: Procedure(s) with comments: BILATERAL LUMBAR FIVE-SACRAL ONE LUMBAR FUSION WITH SCREWS (N/A) - L4-5 L5-S1 Fusion  Patient Location: PACU  Anesthesia Type:General  Level of Consciousness: awake, alert , oriented and patient cooperative  Airway & Oxygen Therapy: Patient Spontanous Breathing and Patient connected to nasal cannula oxygen  Post-op Assessment: Report given to RN and Post -op Vital signs reviewed and stable  Post vital signs: Reviewed and stable  Last Vitals:  Filed Vitals:   09/28/14 1910  BP:   Pulse: 80  Temp:   Resp: 22    Complications: No apparent anesthesia complications

## 2014-09-28 NOTE — Consult Note (Signed)
  No change in the last 18 hours

## 2014-09-28 NOTE — Anesthesia Preprocedure Evaluation (Addendum)
Anesthesia Evaluation  Patient identified by MRN, date of birth, ID band Patient awake    Reviewed: Allergy & Precautions, NPO status , Patient's Chart, lab work & pertinent test results  History of Anesthesia Complications Negative for: history of anesthetic complications  Airway Mallampati: II  TM Distance: >3 FB Neck ROM: Full    Dental  (+) Poor Dentition, Missing, Chipped, Dental Advisory Given   Pulmonary COPDformer smoker (quit 1980),  breath sounds clear to auscultation        Cardiovascular hypertension, Pt. on medications - anginaRhythm:Regular Rate:Normal  '14 ECHO: EF 94-70%, grade 1 diastolic dysfunction, mild MR   Neuro/Psych Anxiety Chronic back pain    GI/Hepatic Neg liver ROS, hiatal hernia, GERD-  Medicated and Controlled,  Endo/Other  diabetes (glu 132), Oral Hypoglycemic AgentsMorbid obesity  Renal/GU negative Renal ROS     Musculoskeletal   Abdominal (+) + obese,   Peds  Hematology negative hematology ROS (+)   Anesthesia Other Findings   Reproductive/Obstetrics                           Anesthesia Physical Anesthesia Plan  ASA: III  Anesthesia Plan: General   Post-op Pain Management:    Induction: Intravenous  Airway Management Planned: Oral ETT  Additional Equipment:   Intra-op Plan:   Post-operative Plan: Extubation in OR  Informed Consent: I have reviewed the patients History and Physical, chart, labs and discussed the procedure including the risks, benefits and alternatives for the proposed anesthesia with the patient or authorized representative who has indicated his/her understanding and acceptance.   Dental advisory given  Plan Discussed with: CRNA and Surgeon  Anesthesia Plan Comments: (Plan routine monitors, GETA)        Anesthesia Quick Evaluation

## 2014-09-29 LAB — GLUCOSE, CAPILLARY
Glucose-Capillary: 170 mg/dL — ABNORMAL HIGH (ref 65–99)
Glucose-Capillary: 204 mg/dL — ABNORMAL HIGH (ref 65–99)
Glucose-Capillary: 161 mg/dL — ABNORMAL HIGH (ref 65–99)
Glucose-Capillary: 121 mg/dL — ABNORMAL HIGH (ref 65–99)

## 2014-09-29 MED ORDER — DOCUSATE SODIUM 100 MG PO CAPS
100.0000 mg | ORAL_CAPSULE | Freq: Two times a day (BID) | ORAL | Status: DC
  Administered 2014-09-29 – 2014-10-02 (×6): 100 mg via ORAL
  Filled 2014-09-29 (×6): qty 1

## 2014-09-29 MED ORDER — SENNA 8.6 MG PO TABS
1.0000 | ORAL_TABLET | Freq: Every day | ORAL | Status: DC
  Administered 2014-09-29 – 2014-10-01 (×3): 8.6 mg via ORAL
  Filled 2014-09-29 (×3): qty 1

## 2014-09-29 MED FILL — Heparin Sodium (Porcine) Inj 1000 Unit/ML: INTRAMUSCULAR | Qty: 30 | Status: AC

## 2014-09-29 MED FILL — Sodium Chloride IV Soln 0.9%: INTRAVENOUS | Qty: 2000 | Status: AC

## 2014-09-29 NOTE — Evaluation (Signed)
Occupational Therapy Evaluation Patient Details Name: Karen Tate MRN: [id number] DOB: 04-18-1945 Today's Date: 09/29/2014    History of Present Illness 69 y.o. female s/p L5-S1 lumbar fusion with screws. PMH: diabetes, HTN, arthritis   Clinical Impression   Patient is s/p L5-S1 lumbar fusion surgery resulting in functional limitations due to the deficits listed below (see OT problem list). Pt sitting in chair upon arrival reporting pain 6/10. Education and handout provided on back precautions and maintaining precautions during ADLs. Education and demonstration provided on use of AE for LB dressing and bathing. Upon standing, Pt reported feeling light headed/ dizzy and hot. Pt returned to bed and BP assessed to be 173/55, RN notified. Patient will benefit from skilled OT acutely to increase independence and safety with ADLS to allow discharge home.     Follow Up Recommendations  Other (comment) (TBD next session; unknown due to pain at this time)    Equipment Recommendations  Other (comment) (TBD next session)    Recommendations for Other Services       Precautions / Restrictions Precautions Precautions: Back Precaution Booklet Issued: Yes (comment) Precaution Comments: handout provided and reviewed in detail for adls Required Braces or Orthoses: Spinal Brace Spinal Brace: Lumbar corset;Applied in sitting position Restrictions Weight Bearing Restrictions: No      Mobility Bed Mobility Overal bed mobility: Needs Assistance Bed Mobility: Rolling;Sit to Sidelying Rolling: Min assist       Sit to sidelying: Mod assist General bed mobility comments: in chair on arrival; mod A return to bed due to dizziness and pain  Transfers Overall transfer level: Needs assistance Equipment used: Rolling walker (2 wheeled) Transfers: Sit to/from Stand Sit to Stand: Min assist         General transfer comment: VCs for technique    Balance Overall balance assessment: Needs  assistance Sitting-balance support: Feet supported;Single extremity supported       Standing balance support: Bilateral upper extremity supported                                ADL Overall ADL's : Needs assistance/impaired Eating/Feeding: Independent;Sitting   Grooming: Minimal assistance;Standing (cuing for back precautions)   Upper Body Bathing: Minimal assitance;Sitting   Lower Body Bathing: Minimal assistance;Sit to/from stand   Upper Body Dressing : Minimal assistance;Sitting   Lower Body Dressing: Minimal assistance;Sit to/from stand;Cueing for back precautions   Toilet Transfer: Minimal assistance;Ambulation;BSC;RW   Toileting- Clothing Manipulation and Hygiene: Minimal assistance;With adaptive equipment;Sit to/from stand;Cueing for back precautions   Tub/ Shower Transfer: Moderate assistance;Ambulation;3 in 1;Rolling walker   Functional mobility during ADLs: Moderate assistance;Rolling walker General ADL Comments: Education provided on AE to assist with ADLs; Pt reports spouse is retired and can A with ADLs as needed.     Vision Vision Assessment?: No apparent visual deficits   Perception     Praxis      Pertinent Vitals/Pain Pain Assessment: 0-10 Pain Score: 6  Pain Descriptors / Indicators: Aching;Grimacing Pain Intervention(s): Limited activity within patient's tolerance;Monitored during session;Premedicated before session;Repositioned;PCA encouraged;Relaxation     Hand Dominance Right   Extremity/Trunk Assessment Upper Extremity Assessment Upper Extremity Assessment: Generalized weakness   Lower Extremity Assessment Lower Extremity Assessment: Defer to PT evaluation   Cervical / Trunk Assessment Cervical / Trunk Assessment: Other exceptions (current surg)   Communication Communication Communication: No difficulties   Cognition Arousal/Alertness: Awake/alert Behavior During Therapy: WFL for tasks assessed/performed Overall  Cognitive  Status: Within Functional Limits for tasks assessed                     General Comments       Exercises       Shoulder Instructions      Home Living Family/patient expects to be discharged to:: Private residence Living Arrangements: Spouse/significant other Available Help at Discharge: Available 24 hours/day;Family (spouse is retired) Type of Home: [place]: One level     Bathroom Shower/Tub: Tub/shower unit;Curtain Shower/tub characteristics: Architectural technologist: Handicapped height     Home Equipment: [place] - single point;Grab bars - tub/shower;Hand held shower head   Additional Comments: uses cane all the time      Prior Functioning/Environment Level of Independence: Independent             OT Diagnosis: Generalized weakness;Acute pain   OT Problem List: Decreased strength;Decreased activity tolerance;Impaired balance (sitting and/or standing);Decreased knowledge of use of DME or AE;Decreased knowledge of precautions;Pain   OT Treatment/Interventions: Self-care/ADL training;Therapeutic exercise;DME and/or AE instruction;Therapeutic activities;Patient/family education;Balance training    OT Goals(Current goals can be found in the care plan section) Acute Rehab OT Goals Patient Stated Goal: to get better OT Goal Formulation: With patient Time For Goal Achievement: 10/13/14 Potential to Achieve Goals: Good ADL Goals Pt Will Perform Grooming: with supervision;standing Pt Will Perform Upper Body Dressing: with modified independence;sitting (don brace) Pt Will Perform Lower Body Dressing: with min guard assist;with adaptive equipment;sit to/from stand Pt Will Perform Toileting - Clothing Manipulation and hygiene: with modified independence;with adaptive equipment;sit to/from stand Pt Will Perform Tub/Shower Transfer: Tub transfer;with min assist;ambulating;3 in 1;rolling walker Additional ADL Goal #1: Pt will verbalize 3/3  precautions as precursor to ADLs  OT Frequency: Min 2X/week   Barriers to D/C:            Co-evaluation              End of Session Equipment Utilized During Treatment: Gait belt;Rolling walker;Back brace Nurse Communication: Mobility status;Precautions;Other (comment) (elevated BP)  Activity Tolerance: Patient limited by pain Patient left: in bed;with call bell/phone within reach;with bed alarm set   Time: 6945-0388 OT Time Calculation (min): 37 min Charges:  OT General Charges $OT Visit: 1 Procedure OT Evaluation $Initial OT Evaluation Tier I: 1 Procedure OT Treatments $Self Care/Home Management : 8-22 mins G-Codes:    Forest Gleason 09/29/2014, 9:47 AM

## 2014-09-29 NOTE — Op Note (Signed)
NAMEMIABELLA, SHANNAHAN                  ACCOUNT NO.:  [id number]  MEDICAL RECORD NO.:  [id number]  LOCATION:  [id number]                        FACILITY:  [facility]  PHYSICIAN:  Leeroy Cha, M.D.   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  POSTOPERATIVE DIAGNOSES:  L4-L5 and L5-S1 spondylolisthesis with chronic radiculopathy, degenerative disk disease.  PROCEDURES:  Bilateral L4 and L5 laminectomy, bilateral facetectomy.  At the level of L4-L5, bilateral diskectomy medially and laterally more than normal to introduce two expandable cages.  At the level of L5-S1, bilateral diskectomy with introduction of one single cage in the midline.  Pedicle screws at L4, L5, and S1.  Posterolateral arthrodesis with autograft and BMP.  Cell Saver.  C-arm.  SURGEON:  Leeroy Cha, M.D.  ASSISTANT:  Eustace Moore, MD  CLINICAL HISTORY:  Ms. Bernardi is a 69 year old female who had been complaining of back pain radiation to both legs for many years.  She is getting worse up to the point, sitting is the only way for her to get relieve from the pain.  X-ray showed that she has stenosis with spondylolisthesis at the level of L4-L5 and L5-S1.  She and her family knew the risk with the surgery including the possibility of infection, CSF leak, no improvement whatsoever and need of further surgery.  DESCRIPTION OF PROCEDURE:  The patient was taken to the OR, and after intubation, she was positioned in a prone manner.  The back was cleaned with Betadine and DuraPrep.  Midline incision was made right at the parallel to the iliac crest and muscles were retracted laterally.  The first x-ray showed that we were right at the level of L2-3.  The patient had quite a bit of hyperlordosis at the lumbar spine.  We extended  our incision down below.  Further x-ray showed that we were right on target. We proceeded with removal of the spinous process of L4 and L5 as well as the lamina.  Bilateral facetectomy of those two levels was done.  At the level of L4-L5, we retracted medially the thecal sac first in the right side and then in the left side.  Total gross diskectomy was done.  The endplates were removed.  Then, two cages expandable with autograft and BMP were inserted.  The rest of the disk space was filled up with the BMP and autograft.  At the level of L5-S1 after we did a bilateral diskectomy, we have following the space to introduce one single cage in the midline, which was also expanded to 17.  The rest of the disk space was filled up with BMP and autograft.  Then, using the C-arm first in AP view and then a lateral view, we made holes in the pedicles of L4, L5, S1.  Prior to introduction of the screws, we feel all four quadrants just to be sure that we were surrounded by bone.  The screws from 5.5 x 40 were inserted and kept in place with rods, two of them and  cross- link.  We went laterally, we removed the periosteum of L4-5 and L5-S1 and a mix of BMP and autograft was used for arthrodesis.  The area was irrigated.  Valsalva maneuver was negative.  Then, vancomycin power was left in the operative site as well as drain.  The wound was closed with several layers of Vicryl and Steri-Strip.          ______________________________ Leeroy Cha, M.D.     EB/MEDQ  D:  09/28/2014  T:  09/29/2014  Job:  [id number]

## 2014-09-29 NOTE — Clinical Social Work Note (Signed)
CSW Consult Acknowledged:   CSW received a consult for SNF placement. Per PT/OT evaluation the appropriate level of care is Home Health PT. CSW will sign off.   Azaryah Oleksy, MSW, LCSWA [phone]  

## 2014-09-29 NOTE — Progress Notes (Signed)
Anastasio Champion, RN pulled a morphine pca syringe to replace, however, patient changed her mind and stated she didn't need it.  He had removed syringe from bag but not opened. Spoke with Mickel Baas, Pharmacist.  I returned the syringe to her in main pharmacy.

## 2014-09-29 NOTE — Evaluation (Signed)
Physical Therapy Evaluation Patient Details Name: Karen Tate MRN: [id number] DOB: 1945/04/27 Today's Date: 09/29/2014   History of Present Illness  69 y.o. female s/p L5-S1 lumbar fusion with screws. PMH: diabetes, HTN, arthritis  Clinical Impression  Patient demonstrates deficits in functional mobility as indicated below. Will need continued skilled PT to address deficits and maximize function. Will see as indicated and progress as tolerated.  OF NOTE: patient with some initial dizziness with EOB activity. Tolerated OOB to chair, educated regarding precautions.    Follow Up Recommendations Home health PT;Supervision/Assistance - 24 hour    Equipment Recommendations  Rolling walker with 5" wheels    Recommendations for Other Services       Precautions / Restrictions Precautions Precautions: Back Precaution Booklet Issued: Yes (comment) Precaution Comments: handout provided and reviewed in detail for adls Required Braces or Orthoses: Spinal Brace Spinal Brace: Lumbar corset;Applied in sitting position      Mobility  Bed Mobility Overal bed mobility: Needs Assistance Bed Mobility: Rolling;Sidelying to Sit Rolling: Min assist Sidelying to sit: Mod assist       General bed mobility comments: VCs for technique and positioning, assist to elevate trunk to EOB  Transfers Overall transfer level: Needs assistance Equipment used: Rolling walker (2 wheeled) Transfers: Sit to/from Stand Sit to Stand: Min assist         General transfer comment: Vcs for hand placement  Ambulation/Gait Ambulation/Gait assistance: Min guard Ambulation Distance (Feet): 6 Feet Assistive device: Rolling walker (2 wheeled)       General Gait Details: steps to chair  Stairs            Wheelchair Mobility    Modified Rankin (Stroke Patients Only)       Balance Overall balance assessment: Needs assistance Sitting-balance support: Feet supported Sitting balance-Leahy Scale: Fair     Standing balance support: Bilateral upper extremity supported Standing balance-Leahy Scale: Fair                               Pertinent Vitals/Pain Pain Assessment: 0-10 Pain Score: 5  Pain Location: Back Pain Descriptors / Indicators: Aching Pain Intervention(s): Monitored during session;Limited activity within patient's tolerance;Repositioned    Home Living Family/patient expects to be discharged to:: Private residence Living Arrangements: Spouse/significant other Available Help at Discharge: Available 24 hours/day;Family (spouse is retired) Type of Home: House       Home Layout: One level Home Equipment: Radio producer - single point;Grab bars - tub/shower;Hand held shower head Additional Comments: uses cane all the time    Prior Function Level of Independence: Independent               Hand Dominance   Dominant Hand: Right    Extremity/Trunk Assessment   Upper Extremity Assessment: Generalized weakness           Lower Extremity Assessment:  (reports heaviness in bilateral LEs)         Communication   Communication: No difficulties  Cognition Arousal/Alertness: Awake/alert Behavior During Therapy: WFL for tasks assessed/performed Overall Cognitive Status: Within Functional Limits for tasks assessed                      General Comments General comments (skin integrity, edema, etc.): initial dizziness at EOB, resolved within moments    Exercises        Assessment/Plan    PT Assessment Patient needs continued PT services  PT Diagnosis  Difficulty walking;Abnormality of gait;Generalized weakness;Acute pain   PT Problem List Decreased strength;Decreased range of motion;Decreased activity tolerance;Decreased balance;Decreased mobility;Decreased coordination;Decreased knowledge of use of DME;Pain  PT Treatment Interventions DME instruction;Gait training;Stair training;Functional mobility training;Therapeutic activities;Therapeutic  exercise;Balance training;Patient/family education   PT Goals (Current goals can be found in the Care Plan section) Acute Rehab PT Goals Patient Stated Goal: to get better PT Goal Formulation: With patient Time For Goal Achievement: 10/13/14 Potential to Achieve Goals: Good    Frequency Min 5X/week   Barriers to discharge        Co-evaluation               End of Session Equipment Utilized During Treatment: Gait belt;Back brace Activity Tolerance: Patient tolerated treatment well;Patient limited by fatigue;Patient limited by pain (some dizziness with EOB activity) Patient left: in chair;with call bell/phone within reach;with chair alarm set Nurse Communication: Mobility status         Time: 0731-0759 PT Time Calculation (min) (ACUTE ONLY): 28 min   Charges:   PT Evaluation $Initial PT Evaluation Tier I: 1 Procedure PT Treatments $Therapeutic Activity: 8-22 mins   PT G CodesDuncan Dull 2014-10-12, 12:59 PM Alben Deeds, [place] DPT  [phone]

## 2014-09-29 NOTE — Progress Notes (Signed)
Pt arrived to unit via bed from PACU.  Pt awake and oriented to unit, staff and plan of care.  Pt complaining of lower back pain but denies any numbness and tingling.  Able to move all extremeties. Family at bedside.

## 2014-09-29 NOTE — Progress Notes (Signed)
Utilization review completed.  

## 2014-09-30 LAB — GLUCOSE, CAPILLARY
Glucose-Capillary: 143 mg/dL — ABNORMAL HIGH (ref 65–99)
Glucose-Capillary: 149 mg/dL — ABNORMAL HIGH (ref 65–99)
Glucose-Capillary: 158 mg/dL — ABNORMAL HIGH (ref 65–99)
Glucose-Capillary: 132 mg/dL — ABNORMAL HIGH (ref 65–99)

## 2014-09-30 LAB — CBC WITH DIFFERENTIAL/PLATELET
Basophils Absolute: 0 10*3/uL (ref 0.0–0.1)
Basophils Relative: 0 % (ref 0–1)
Eosinophils Absolute: 0.1 10*3/uL (ref 0.0–0.7)
Eosinophils Relative: 1 % (ref 0–5)
HCT: 24.2 % — ABNORMAL LOW (ref 36.0–46.0)
Hemoglobin: 8 g/dL — ABNORMAL LOW (ref 12.0–15.0)
Lymphocytes Relative: 13 % (ref 12–46)
Lymphs Abs: 1.1 10*3/uL (ref 0.7–4.0)
MCH: 30.2 pg (ref 26.0–34.0)
MCHC: 33.1 g/dL (ref 30.0–36.0)
MCV: 91.3 fL (ref 78.0–100.0)
Monocytes Absolute: 0.7 10*3/uL (ref 0.1–1.0)
Monocytes Relative: 8 % (ref 3–12)
Neutro Abs: 6.3 10*3/uL (ref 1.7–7.7)
Neutrophils Relative %: 78 % — ABNORMAL HIGH (ref 43–77)
Platelets: 178 10*3/uL (ref 150–400)
RBC: 2.65 MIL/uL — ABNORMAL LOW (ref 3.87–5.11)
RDW: 15 % (ref 11.5–15.5)
WBC: 8.1 10*3/uL (ref 4.0–10.5)

## 2014-09-30 MED ORDER — POLYETHYLENE GLYCOL 3350 17 G PO PACK: 17.0000 g | PACK | Freq: Every day | ORAL | Status: DC

## 2014-09-30 MED ORDER — POLYETHYLENE GLYCOL 3350 17 G PO PACK
17.0000 g | PACK | ORAL | Status: DC | PRN
  Administered 2014-09-30: 17 g via ORAL
  Filled 2014-09-30: qty 1

## 2014-09-30 NOTE — Progress Notes (Signed)
Physical Therapy Treatment Patient Details Name: Karen Tate MRN: [id number] DOB: 03/03/1946 Today's Date: 09/30/2014    History of Present Illness 69 y.o. female s/p L5-S1 lumbar fusion with screws. PMH: diabetes, HTN, arthritis    PT Comments    Patient remains limited with mobility this session secondary to increased Pain. Patient required assist for stability during ambulation and functional tasks. Continues to endorse modest dizziness with activity.  Will continue to see and progress as tolerated. If patient does not progress, may need to consider other therapeutic venues post acute discharge.  Limiting factor (pain) remains high at this time   Follow Up Recommendations  Home health PT;Supervision/Assistance - 24 hour     Equipment Recommendations  Rolling walker with 5" wheels    Recommendations for Other Services       Precautions / Restrictions Precautions Precautions: Back Precaution Booklet Issued: Yes (comment) Precaution Comments: handout provided and reviewed in detail for adls Required Braces or Orthoses: Spinal Brace Spinal Brace: Lumbar corset;Applied in sitting position Restrictions Weight Bearing Restrictions: No    Mobility  Bed Mobility Overal bed mobility: Needs Assistance Bed Mobility: Rolling;Sit to Sidelying Rolling: Min assist       Sit to sidelying: Mod assist General bed mobility comments: VCs for technique and sequencing. assist to elevate trunk to EOB, patient with poor activity tolerance for bed mobility increased pain noted.  Transfers Overall transfer level: Needs assistance Equipment used: Rolling walker (2 wheeled) Transfers: Sit to/from Stand Sit to Stand: Min assist         General transfer comment: VCs for technique, difficulty power up to standing secondary to increased pain. Patient with assist to power up and for stability. Performed from bed and toilet  Ambulation/Gait Ambulation/Gait assistance: Min guard;Min  assist Ambulation Distance (Feet): 60 Feet Assistive device: Rolling walker (2 wheeled) Gait Pattern/deviations: Step-to pattern;Decreased stride length;Drifts right/left;Shuffle Gait velocity: decreased Gait velocity interpretation: <1.8 ft/sec, indicative of risk for recurrent falls General Gait Details: significant pain during mobility. Patient very tense, max cues for relaxation of UEs during mobility. VCs for positioning within RW   Stairs            Wheelchair Mobility    Modified Rankin (Stroke Patients Only)       Balance Overall balance assessment: Needs assistance Sitting-balance support: Feet supported Sitting balance-Leahy Scale: Fair     Standing balance support: Bilateral upper extremity supported Standing balance-Leahy Scale: Fair Standing balance comment: Difficulty in standing secondary to increased pain                    Cognition Arousal/Alertness: Awake/alert Behavior During Therapy: WFL for tasks assessed/performed Overall Cognitive Status: Within Functional Limits for tasks assessed       Memory: Decreased recall of precautions              Exercises      General Comments        Pertinent Vitals/Pain Pain Assessment: 0-10 Pain Score: 6  Pain Location: back Pain Descriptors / Indicators: Grimacing;Guarding;Aching;Sore;Cramping Pain Intervention(s): Limited activity within patient's tolerance;Monitored during session;Repositioned;Relaxation    Home Living                      Prior Function            PT Goals (current goals can now be found in the care plan section) Acute Rehab PT Goals Patient Stated Goal: to get better PT Goal Formulation: With patient Time  For Goal Achievement: 10/13/14 Potential to Achieve Goals: Good Progress towards PT goals: Progressing toward goals (modest)    Frequency  Min 5X/week    PT Plan Current plan remains appropriate    Co-evaluation             End of  Session Equipment Utilized During Treatment: Gait belt;Back brace Activity Tolerance: Patient tolerated treatment well;Patient limited by fatigue;Patient limited by pain Patient left: in chair;with call bell/phone within reach;with chair alarm set     Time: 0459-9774 PT Time Calculation (min) (ACUTE ONLY): 23 min  Charges:  $Gait Training: 8-22 mins $Therapeutic Activity: 8-22 mins                    G CodesDuncan Dull 10-27-2014, 12:06 PM Alben Deeds, [place] DPT  [phone]

## 2014-09-30 NOTE — Progress Notes (Signed)
ON ASSESSMENT THIS AM HEMOVAC WAS FOUND IN BED, NO COMPLICATIONS NOTED. BOTERO AWARE.

## 2014-09-30 NOTE — Care Management (Signed)
Important Message  Patient Details  Name: LESHEA JAGGERS MRN: [id number] Date of Birth: 04/13/1945   Medicare Important Message Given:  Yes-second notification given    Rolm Baptise, RN 09/30/2014, 11:02 AM

## 2014-09-30 NOTE — Progress Notes (Signed)
Thank you for consult on [name].  She underwent surgery on 07/06 with evaluations yesterday. HH therapy recommended for follow up as anticipate good progress. Will defer CIR consult for now.

## 2014-09-30 NOTE — Progress Notes (Signed)
Patient ID: Karen Tate, female   DOB: [birth year], 69 y.o.   MRN: [id number] hemovac out. C/o incisional pain, no weakness. Rehab to see. Off pca morphine

## 2014-09-30 NOTE — Progress Notes (Signed)
Occupational Therapy Treatment Patient Details Name: Karen Tate MRN: [id number] DOB: 25-Nov-1945 Today's Date: 09/30/2014    History of present illness 69 y.o. female s/p L5-S1 lumbar fusion with screws. PMH: diabetes, HTN, arthritis   OT comments  Pt sitting in chair upon OT arrival. Pt unable to recall precautions; handout reviewed with Pt. Education and demonstration provided on use of AE for LB dressing and bathing. Pt reports having reacher at home. Pt requiring 3 attempts to stand from chair due to pain; reporting she has been sitting since 8am. Pt returned to bed. Pt would benefit from skilled OT for increased safety and independence with ADLs prior to d/c home with [facility] OT.   Follow Up Recommendations  Home health OT    Equipment Recommendations  3 in 1 bedside comode    Recommendations for Other Services      Precautions / Restrictions Precautions Precautions: Back Precaution Booklet Issued: Yes (comment) Precaution Comments: pt unable to verbalize precautions, handout reviewed Required Braces or Orthoses: Spinal Brace Spinal Brace: Lumbar corset;Applied in sitting position Restrictions Weight Bearing Restrictions: No       Mobility Bed Mobility Overal bed mobility: Needs Assistance Bed Mobility: Rolling;Sit to Sidelying Rolling: Min guard       Sit to sidelying: Min assist General bed mobility comments: VC for technique, assist to elevate feet to bed  Transfers Overall transfer level: Needs assistance Equipment used: Rolling walker (2 wheeled) Transfers: Sit to/from Stand Sit to Stand: Min assist         General transfer comment: Pt requiring 3 attempts to stand from recliner due to pain    Balance Overall balance assessment: Needs assistance Sitting-balance support: Feet supported       Standing balance support: Bilateral upper extremity supported                       ADL Overall ADL's : Needs assistance/impaired Eating/Feeding:  Independent;Sitting               Upper Body Dressing : Min guard;Sitting (doff brace)   Lower Body Dressing: Min guard;With adaptive equipment;Sit to/from stand Lower Body Dressing Details (indicate cue type and reason): education and demonstration of AE for LB dressing             Functional mobility during ADLs: Minimal assistance;Rolling walker        Vision                     Perception     Praxis      Cognition   Behavior During Therapy: WFL for tasks assessed/performed Overall Cognitive Status: Within Functional Limits for tasks assessed       Memory: Decreased recall of precautions               Extremity/Trunk Assessment               Exercises     Shoulder Instructions       General Comments      Pertinent Vitals/ Pain       Pain Assessment: 0-10 Pain Score: 6  Pain Location: back Pain Descriptors / Indicators: Sore Pain Intervention(s): Limited activity within patient's tolerance;Monitored during session;Repositioned  Home Living  Prior Functioning/Environment              Frequency Min 2X/week     Progress Toward Goals  OT Goals(current goals can now be found in the care plan section)  Progress towards OT goals: Progressing toward goals  Acute Rehab OT Goals Patient Stated Goal: to get better OT Goal Formulation: With patient Time For Goal Achievement: 10/13/14 Potential to Achieve Goals: Good ADL Goals Pt Will Perform Grooming: with supervision;standing Pt Will Perform Upper Body Dressing: with modified independence;sitting Pt Will Perform Lower Body Dressing: with min guard assist;with adaptive equipment;sit to/from stand Pt Will Perform Toileting - Clothing Manipulation and hygiene: with modified independence;with adaptive equipment;sit to/from stand Pt Will Perform Tub/Shower Transfer: Tub transfer;with min assist;ambulating;3 in 1;rolling  walker Additional ADL Goal #1: Pt will verbalize 3/3 precautions as precursor to ADLs  Plan Discharge plan needs to be updated    Co-evaluation                 End of Session Equipment Utilized During Treatment: Gait belt;Rolling walker;Back brace   Activity Tolerance Patient tolerated treatment well   Patient Left in bed;with call bell/phone within reach;with family/visitor present   Nurse Communication Mobility status;Precautions        Time: 7414-2395 OT Time Calculation (min): 21 min  Charges: OT General Charges $OT Visit: 1 Procedure OT Treatments $Self Care/Home Management : 8-22 mins  Forest Gleason 09/30/2014, 11:39 AM

## 2014-10-01 LAB — GLUCOSE, CAPILLARY
Glucose-Capillary: 136 mg/dL — ABNORMAL HIGH (ref 65–99)
Glucose-Capillary: 152 mg/dL — ABNORMAL HIGH (ref 65–99)
Glucose-Capillary: 137 mg/dL — ABNORMAL HIGH (ref 65–99)
Glucose-Capillary: 79 mg/dL (ref 65–99)

## 2014-10-01 MED ORDER — FLEET ENEMA 7-19 GM/118ML RE ENEM
1.0000 | ENEMA | Freq: Every day | RECTAL | Status: DC | PRN
  Filled 2014-10-01: qty 1

## 2014-10-01 NOTE — Progress Notes (Signed)
Physical Therapy Treatment Patient Details Name: Karen Tate MRN: [id number] DOB: Jul 02, 1945 Today's Date: 10/01/2014    History of Present Illness 69 y.o. female s/p L5-S1 lumbar fusion with screws. PMH: diabetes, HTN, arthritis    PT Comments    Mobility continues to be limited by pain, anxiety and dizziness. Per OT note, pt was moving better this AM. Pt reports possible d/c home tomorrow.  Follow Up Recommendations  Home health PT;Supervision/Assistance - 24 hour     Equipment Recommendations  Rolling walker with 5" wheels    Recommendations for Other Services       Precautions / Restrictions Precautions Precautions: Back Precaution Comments: Reviewed 3/3 back precautions. Required Braces or Orthoses: Spinal Brace Spinal Brace: Lumbar corset;Applied in sitting position    Mobility  Bed Mobility     Rolling: Min assist Sidelying to sit: Min assist     Sit to sidelying: Min assist General bed mobility comments: verbal cues for technique/precautions  Transfers   Equipment used: Rolling walker (2 wheeled)   Sit to Stand: Min assist         General transfer comment: Assist to maintain balance with initial stance. Verbal cues for hand placement.  Ambulation/Gait Ambulation/Gait assistance: Min guard Ambulation Distance (Feet): 90 Feet Assistive device: Rolling walker (2 wheeled) Gait Pattern/deviations: Shuffle;Decreased stride length Gait velocity: decreased Gait velocity interpretation: Below normal speed for age/gender General Gait Details: Pt very anxious and tense during ambulation. Reports dizziness and vertigo. No LOB noted.   Stairs            Wheelchair Mobility    Modified Rankin (Stroke Patients Only)       Balance                                    Cognition Arousal/Alertness: Awake/alert Behavior During Therapy: WFL for tasks assessed/performed Overall Cognitive Status: Within Functional Limits for tasks  assessed                      Exercises      General Comments        Pertinent Vitals/Pain Pain Assessment: 0-10 Pain Score: 6  Pain Location: back Pain Descriptors / Indicators: Grimacing;Guarding;Cramping Pain Intervention(s): Patient requesting pain meds-RN notified;Limited activity within patient's tolerance    Home Living                      Prior Function            PT Goals (current goals can now be found in the care plan section) Acute Rehab PT Goals Patient Stated Goal: to get better PT Goal Formulation: With patient Time For Goal Achievement: 10/13/14 Potential to Achieve Goals: Good Progress towards PT goals: Progressing toward goals    Frequency  Min 5X/week    PT Plan Current plan remains appropriate    Co-evaluation             End of Session Equipment Utilized During Treatment: Gait belt;Back brace Activity Tolerance: No increased pain Patient left: in bed;with call bell/phone within reach;with family/visitor present;with SCD's reapplied     Time: 1312-1340 PT Time Calculation (min) (ACUTE ONLY): 28 min  Charges:  $Gait Training: 23-37 mins                    G Codes:      Lorriane Shire 10/01/2014,  1:46 PM

## 2014-10-01 NOTE — Care Management Note (Signed)
Case Management Note  Patient Details  Name: ZENDAYA GROSECLOSE MRN: [id number] Date of Birth: 1945/10/17  Subjective/Objective:            s/p L5-S1 lumbar fusion with screws        Action/Plan: Home Health  Expected Discharge Date:                  Expected Discharge Plan:  [place]  In-House Referral:     Discharge planning Services  CM Consult  Post Acute Care Choice:    Choice offered to:  Patient, Adult Children  DME Arranged:  3-N-1, Walker rolling DME Agency:  [place] Arranged:  PT, OT [facility] Agency:  [place]  Status of Service:  In process, will continue to follow  Medicare Important Message Given:  Yes-second notification given Date Medicare IM Given:    Medicare IM give by:    Date Additional Medicare IM Given:    Additional Medicare Important Message give by:     If discussed at [place] of Stay Meetings, dates discussed:    Additional Comments:  Dimas Aguas, RN 10/01/2014, 2:26 PM

## 2014-10-01 NOTE — Progress Notes (Signed)
RN CM called Probation officer requesting I obtain all orders for DC needs. Dr Christella Noa notified that CM needs orders for DME- rolling walker, and 3 in 1, home health PT and OT, and a F2F. Md to enter orders with potential DC tomorrow.

## 2014-10-01 NOTE — Progress Notes (Signed)
CM spoke with patient and an adult family member in room about home health PT/OT, Rolling Walker and 3in1.  Patient chose [facility].  CM called Tiffany of [facility] at [phone] to arrange for PT/OT and also called Jeneen Rinks of [facility] at [phone] for DME:.   Patient says she lives with her husband and he will be supervising her post discharge.  Patient does not have order for home health needs in the systen and face to face is not completed, left sticky note for MD and also informed Levada Dy, RN working with patient.

## 2014-10-01 NOTE — Progress Notes (Signed)
Occupational Therapy Treatment Patient Details Name: Karen Tate MRN: [id number] DOB: Sep 21, 1945 Today's Date: 10/01/2014    History of present illness 69 y.o. female s/p L5-S1 lumbar fusion with screws. PMH: diabetes, HTN, arthritis   OT comments  Pt. With noted improvement from previous documentation stating pain is well managed and she is able to complete transfers and mobility with less effort. Able to return demo of toileting and tub transfer safely.  Has 24/7 A from spouse who will be assisting with LB ADLS and pericare as needed.  Clear for d/c from OT standpoint.  Will notify OTR/L.   Follow Up Recommendations  Home health OT    Equipment Recommendations  3 in 1 bedside comode    Recommendations for Other Services      Precautions / Restrictions Precautions Precautions: Back Precaution Comments: reveiwed with pt., able to name 2/3 required cues for "no arching" Required Braces or Orthoses: Spinal Brace Spinal Brace: Lumbar corset;Applied in sitting position       Mobility Bed Mobility               General bed mobility comments: in recliner upon arrival but states "today i could just pop right out of bed and it didnt bother me at all"  Transfers Overall transfer level: Needs assistance Equipment used: Rolling walker (2 wheeled) Transfers: Sit to/from [facility] Sit to Stand: Supervision Stand pivot transfers: Supervision       General transfer comment: slow during transition from sit/stand but able to without assistance    Balance                                   ADL Overall ADL's : Needs assistance/impaired     Grooming: Wash/dry hands;Standing;Supervision/safety         Lower Body Bathing Details (indicate cue type and reason): reports husband available 24/7 for A       Lower Body Dressing Details (indicate cue type and reason): reports husband available 24/7 for A Toilet Transfer:  Supervision/safety;Ambulation;Comfort height toilet;Grab bars;RW   Toileting- Clothing Manipulation and Hygiene: Minimal assistance;Sit to/from stand Toileting - Clothing Manipulation Details (indicate cue type and reason): states husband can assist as needed with peri-care for thoroughness Tub/ Shower Transfer: Tub transfer;Grab bars;3 in 1;Min guard Tub/Shower Transfer Details (indicate cue type and reason): side step over tub with right faucet Functional mobility during ADLs: Minimal assistance;Rolling walker General ADL Comments: pt. able to return demo of tub transfer and toilet transfer.  husband able to assist with LB adls and peri care      Vision                     Perception     Praxis      Cognition   Behavior During Therapy: [facility] for tasks assessed/performed Overall Cognitive Status: Within Functional Limits for tasks assessed                       Extremity/Trunk Assessment               Exercises     Shoulder Instructions       General Comments      Pertinent Vitals/ Pain       Pain Assessment: 0-10 Pain Score: 1  Pain Location: back Pain Intervention(s): Premedicated before session  Home Living  Prior Functioning/Environment              Frequency Min 2X/week     Progress Toward Goals  OT Goals(current goals can now be found in the care plan section)  Progress towards OT goals: Goals met/education completed, patient discharged from [place] Discharge plan remains appropriate    Co-evaluation                 End of Session Equipment Utilized During Treatment: Gait belt;Rolling walker;Back brace   Activity Tolerance Patient tolerated treatment well   Patient Left in chair;with call bell/phone within reach;with family/visitor present   Nurse Communication          Time: 3612-2449 OT Time Calculation (min): 24 min  Charges: OT General  Charges $OT Visit: 1 Procedure OT Treatments $Self Care/Home Management : 23-37 mins  Janice Coffin, COTA/L 10/01/2014, 7:49 AM

## 2014-10-01 NOTE — Progress Notes (Signed)
Patient ID: Karen Tate, female   DOB: 1945-06-10, 69 y.o.   MRN: [id number] More mobility,less pain. No weakness. Constipated see orders

## 2014-10-02 LAB — GLUCOSE, CAPILLARY
Glucose-Capillary: 124 mg/dL — ABNORMAL HIGH (ref 65–99)
Glucose-Capillary: 145 mg/dL — ABNORMAL HIGH (ref 65–99)

## 2014-10-02 MED ORDER — OXYCODONE-ACETAMINOPHEN 5-325 MG PO TABS: 1.0000 | ORAL_TABLET | Freq: Four times a day (QID) | ORAL | Status: DC | PRN

## 2014-10-02 MED ORDER — TIZANIDINE HCL 4 MG PO TABS: 4.0000 mg | ORAL_TABLET | Freq: Four times a day (QID) | ORAL | Status: DC | PRN

## 2014-10-02 NOTE — Care Management Note (Signed)
Case Management Note  Patient Details  Name: Karen Tate MRN: [id number] Date of Birth: [birth year]  Subjective/Objective:    69 y.o. 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            Action/Plan: Discharged to home with RW and 3:1.    Expected Discharge Date:                  Expected Discharge Plan:  [place]  In-House Referral:     Discharge planning Services  CM Consult  Post Acute Care Choice:    Choice offered to:  Patient, Adult Children  DME Arranged:  3-N-1, Walker rolling DME Agency:  [place]:    Status of Service:  Completed, signed off  Medicare Important Message Given:  Yes-second notification given Date Medicare IM Given:    Medicare IM give by:    Date Additional Medicare IM Given:    Additional Medicare Important Message give by:     If discussed at [place] of Stay Meetings, dates discussed:    Additional Comments:  Delrae Sawyers, RN 10/02/2014, 10:56 AM

## 2014-10-02 NOTE — Care Management Note (Signed)
Case Management Note  Patient Details  Name: Karen Tate MRN: [id number] Date of Birth: [birth year]  Subjective/Objective:                    Action/Plan:Discharged to Home with RW and 3:1; No further needs.    Expected Discharge Date:                  Expected Discharge Plan:  [place]  In-House Referral:     Discharge planning Services  CM Consult  Post Acute Care Choice:    Choice offered to:  Patient, Adult Children  DME Arranged:  3-N-1, Walker rolling DME Agency:  [place]:  PT, OT [place] Agency:  [place]  Status of Service:  Completed, signed off  Medicare Important Message Given:  Yes-second notification given Date Medicare IM Given:    Medicare IM give by:    Date Additional Medicare IM Given:    Additional Medicare Important Message give by:     If discussed at [place] of Stay Meetings, dates discussed:    Additional Comments:  Delrae Sawyers, RN 10/02/2014, 11:26 AM

## 2014-10-02 NOTE — Progress Notes (Signed)
Care Management Jeanie notified of DC order, and need to coordinate home care needs.

## 2014-10-02 NOTE — Progress Notes (Signed)
Physical Therapy Treatment Patient Details Name: Karen Tate MRN: [id number] DOB: [birth year] Today's Date: 10/02/2014    History of Present Illness 69 y.o. female s/p L5-S1 lumbar fusion with screws. PMH: diabetes, HTN, arthritis    PT Comments    Continues to make progress towards physical therapy goals. Safely completed stair training today. Still anxious with ambulation but stable without loss of balance while using a rolling walker for support. Required some extra assistance to rise from recliner this AM due to back spasms but this greatly improved towards end of therapy session. Pt states she will have plenty of assistance at home as needed (24/7) and is ready to go home. Would benefit from HHPT to progress functional mobility at discharge.  Follow Up Recommendations  Home health PT;Supervision/Assistance - 24 hour     Equipment Recommendations  Rolling walker with 5" wheels    Recommendations for Other Services       Precautions / Restrictions Precautions Precautions: Back Precaution Comments: Reviewed 3/3 back precautions. Required Braces or Orthoses: Spinal Brace Spinal Brace: Lumbar corset;Applied in sitting position Restrictions Weight Bearing Restrictions: No    Mobility  Bed Mobility               General bed mobility comments: sitting in chair  Transfers Overall transfer level: Needs assistance Equipment used: Rolling walker (2 wheeled);1 person hand held assist Transfers: Sit to/from Stand Sit to Stand: Mod assist         General transfer comment: Pt initially required Moderate assist to stand due to frequent back spasms. States she "sat too long" in recliner. Performed later in therapy session and required only hand held min assist for boost and balance to rise from recliner. VC for hand placement.  Ambulation/Gait Ambulation/Gait assistance: Min guard Ambulation Distance (Feet): 90 Feet Assistive device: Rolling walker (2 wheeled) Gait  Pattern/deviations: Step-through pattern;Decreased stride length Gait velocity: decreased   General Gait Details: Continues to be very anxious while ambulating. Denied dizziness while ambulating. VC for walker placement, to relax trapezius muscles and to practice pursed lip breathing for relaxation techniques. No overt loss of balance and only required one VC for walker placement for proximity. Focused on larger stride today.   Stairs Stairs: Yes Stairs assistance: Min assist Stair Management: One rail Left;Step to pattern;Forwards;No rails;Backwards;With walker Number of Stairs: 2 (1 step with RW backwards) General stair comments: Backwards approach increased LBP while using RW, appeared more effortful for pt. Practiced hand held assist similar to how pt has navigating stairs at home with her husband. Provided support on Rt side, pt holds rail on Lt (has a pillar she holds at home on Lt). States she feels very comfortable with this technique. Able to perform with cues for sequencing, no buckling noted.  Wheelchair Mobility    Modified Rankin (Stroke Patients Only)       Balance                                    Cognition Arousal/Alertness: Awake/alert Behavior During Therapy: WFL for tasks assessed/performed Overall Cognitive Status: Within Functional Limits for tasks assessed                      Exercises General Exercises - Lower Extremity Ankle Circles/Pumps: AROM;Both;10 reps;Seated Long Arc Quad: Strengthening;Both;10 reps;Seated    General Comments General comments (skin integrity, edema, etc.): Dizzy for <10 seconds upon first  standing from recliner. Educated to stand for 1 minute prior to ambulating at home, if dizziness persists then pt should sit instead of ambulating.      Pertinent Vitals/Pain Pain Assessment: 0-10 Pain Score: 2  Pain Location: back - lower, near buttocks Pain Descriptors / Indicators: Spasm Pain Intervention(s):  Monitored during session;Repositioned;Limited activity within patient's tolerance    Home Living                      Prior Function            PT Goals (current goals can now be found in the care plan section) Acute Rehab PT Goals Patient Stated Goal: Go home PT Goal Formulation: With patient Time For Goal Achievement: 10/13/14 Potential to Achieve Goals: Good Progress towards PT goals: Progressing toward goals    Frequency  Min 5X/week    PT Plan Current plan remains appropriate    Co-evaluation             End of Session Equipment Utilized During Treatment: Gait belt;Back brace Activity Tolerance: No increased pain (with ambulating) Patient left: with call bell/phone within reach;with family/visitor present;in chair     Time: 0712-1975 PT Time Calculation (min) (ACUTE ONLY): 27 min  Charges:  $Gait Training: 8-22 mins $Therapeutic Activity: 8-22 mins                    G Codes:      Ellouise Newer Oct 14, 2014, 11:57 AM Elayne Snare, [place]

## 2014-10-02 NOTE — Discharge Summary (Signed)
Physician Discharge Summary  Patient ID: Karen Tate MRN: [id number] DOB/AGE: [birth year] 69 y.o.  Admit date: 09/28/2014 Discharge date: 10/02/2014  Admission Diagnoses:L4-L5 and L5-S1 spondylolisthesis with chronic radiculopathy, degenerative disk disease   Discharge Diagnoses: L4-L5 and L5-S1 spondylolisthesis with chronic radiculopathy, degenerative disk disease  Active Problems:   Spondylolisthesis of lumbar region   Discharged Condition: good  Hospital Course: Karen Tate was admitted to the hospital for a Bilateral L4 and L5 laminectomy, bilateral facetectomy.  At the level of L4-L5, bilateral diskectomy medially and laterally more than normal to introduce two expandable cages.  At the level of L5-S1, bilateral diskectomy with introduction of one single cage in the midline.  Pedicle screws at L4, L5, and S1.  Posterolateral arthrodesis with autograft and BMP.   Due to lumbAr spondylolisthesis. Post op her wound is clean, without signs of infection. She is tolerating a regular diet, ambulating, and voiding.   Treatments: surgery: as above  Discharge Exam: Blood pressure 133/52, pulse 94, temperature 98 F (36.7 C), temperature source Oral, resp. rate 18, height '5\' 5"'$  (1.651 m), weight 91.672 kg (202 lb 1.6 oz), SpO2 98 %. General appearance: alert, cooperative, appears stated age and no distress Neurologic: Mental status: Alert, oriented, thought content appropriate Cranial nerves: normal Motor: moving lower extremities well  Disposition:  Spondylolisthesis    Medication List    TAKE these medications        ALPRAZolam 0.5 MG tablet  Commonly known as:  XANAX  Take 0.5 mg by mouth 2 (two) times daily.     amLODipine 5 MG tablet  Commonly known as:  NORVASC  Take 5 mg by mouth daily.     benazepril 20 MG tablet  Commonly known as:  LOTENSIN  Take 20 mg by mouth daily.     Black Cohosh 540 MG Caps  Take 1 capsule by mouth daily.     fish oil-omega-3  fatty acids 1000 MG capsule  Take 1 g by mouth 3 (three) times daily.     glipiZIDE 5 MG tablet  Commonly known as:  GLUCOTROL  Take 5 mg by mouth 2 (two) times daily before a meal.     hydrALAZINE 25 MG tablet  Commonly known as:  APRESOLINE  Take 25 mg by mouth 3 (three) times daily.     hydrochlorothiazide 25 MG tablet  Commonly known as:  HYDRODIURIL  Take 25 mg by mouth daily.     HYDROcodone-acetaminophen 10-325 MG per tablet  Commonly known as:  NORCO  Take 1 tablet by mouth every 8 (eight) hours as needed for pain.     magnesium 30 MG tablet  Take 400 mg by mouth 2 (two) times daily.     meclizine 25 MG tablet  Commonly known as:  ANTIVERT  Take 25 mg by mouth 3 (three) times daily as needed for dizziness.     metFORMIN 1000 MG tablet  Commonly known as:  GLUCOPHAGE  Take 1,000 mg by mouth 2 (two) times daily with a meal.     omeprazole 40 MG capsule  Commonly known as:  PRILOSEC  Take 40 mg by mouth daily.     oxyCODONE-acetaminophen 5-325 MG per tablet  Commonly known as:  ROXICET  Take 1 tablet by mouth every 6 (six) hours as needed for severe pain.     potassium chloride SA 20 MEQ tablet  Commonly known as:  K-DUR,KLOR-CON  Take 20 mEq by mouth daily.     tiZANidine 4 MG  tablet  Commonly known as:  ZANAFLEX  Take 1 tablet (4 mg total) by mouth every 6 (six) hours as needed for muscle spasms.           Follow-up Information    Follow up with Karen Stakes, MD In 1 week.   Specialty:  Neurosurgery   Why:  call office to make an appointment for staple removal   Contact information:   [address] [place] [address] [phone]       Signed: Winfield Cunas 10/02/2014, 10:19 AM

## 2014-10-02 NOTE — Care Management Note (Signed)
Case Management Note  Patient Details  Name: HAYLEI COBIN MRN: [id number] Date of Birth: 1945-06-23  Subjective/Objective:                  Discharge Diagnoses: L4-L5 and L5-S1 spondylolisthesis with chronic radiculopathy, degenerative disk disease  Action/Plan: CM spoke to family at the bedside. Pt in the bathroom. Pt states that the family has all DME needed and was delivered to the room this morning (RW, 3N1). No further CM needs communicated. CM called Tiffany with [facility] and confirmed that [facility] PT/OT was ordered.   Expected Discharge Date:  10/02/14               Expected Discharge Plan:  [place]  In-House Referral:     Discharge planning Services  CM Consult  Post Acute Care Choice:    Choice offered to:  Patient, Adult Children  DME Arranged:  3-N-1, Walker rolling DME Agency:  [place]:  PT, OT [facility] Agency:  [place]  Status of Service:  Completed, signed off  Medicare Important Message Given:  Yes-second notification given Date Medicare IM Given:    Medicare IM give by:    Date Additional Medicare IM Given:    Additional Medicare Important Message give by:     If discussed at [place] of Stay Meetings, dates discussed:    Additional Comments:  Guido Sander, RN 10/02/2014, 1:36 PM

## 2014-10-02 NOTE — Progress Notes (Signed)
CM spoke to family at the bedside. Pt in the bathroom. Pt states that the family has all DME needed and was delivered to the room this morning (RW, 3N1). CM outreach to [employer] and advised that face to face is still needed. Information accepted. No further CM needs communicated. CM called Tiffany with [facility] and confirmed that [facility] PT/OT was ordered.

## 2014-10-02 NOTE — Progress Notes (Signed)
DC instructions given to patient and her family. Prescriptions given by MD. Reinforced back precautions and use of stool softener and laxative while using narcotic analgesic. All questions answered. Patient in [facility] to be escorted to lobby by staff. Family to transport home in private vehicle.

## 2014-10-03 DIAGNOSIS — I10 Essential (primary) hypertension: Secondary | ICD-10-CM | POA: Diagnosis not present

## 2014-10-03 DIAGNOSIS — E119 Type 2 diabetes mellitus without complications: Secondary | ICD-10-CM | POA: Diagnosis not present

## 2014-10-03 DIAGNOSIS — Z87891 Personal history of nicotine dependence: Secondary | ICD-10-CM | POA: Diagnosis not present

## 2014-10-03 DIAGNOSIS — M1990 Unspecified osteoarthritis, unspecified site: Secondary | ICD-10-CM | POA: Diagnosis not present

## 2014-10-03 DIAGNOSIS — Z4789 Encounter for other orthopedic aftercare: Secondary | ICD-10-CM | POA: Diagnosis not present

## 2014-10-03 DIAGNOSIS — F419 Anxiety disorder, unspecified: Secondary | ICD-10-CM | POA: Diagnosis not present

## 2014-10-04 DIAGNOSIS — F419 Anxiety disorder, unspecified: Secondary | ICD-10-CM | POA: Diagnosis not present

## 2014-10-04 DIAGNOSIS — M1990 Unspecified osteoarthritis, unspecified site: Secondary | ICD-10-CM | POA: Diagnosis not present

## 2014-10-04 DIAGNOSIS — Z87891 Personal history of nicotine dependence: Secondary | ICD-10-CM | POA: Diagnosis not present

## 2014-10-04 DIAGNOSIS — I10 Essential (primary) hypertension: Secondary | ICD-10-CM | POA: Diagnosis not present

## 2014-10-04 DIAGNOSIS — E119 Type 2 diabetes mellitus without complications: Secondary | ICD-10-CM | POA: Diagnosis not present

## 2014-10-04 DIAGNOSIS — Z4789 Encounter for other orthopedic aftercare: Secondary | ICD-10-CM | POA: Diagnosis not present

## 2014-10-05 DIAGNOSIS — F419 Anxiety disorder, unspecified: Secondary | ICD-10-CM | POA: Diagnosis not present

## 2014-10-05 DIAGNOSIS — Z87891 Personal history of nicotine dependence: Secondary | ICD-10-CM | POA: Diagnosis not present

## 2014-10-05 DIAGNOSIS — I10 Essential (primary) hypertension: Secondary | ICD-10-CM | POA: Diagnosis not present

## 2014-10-05 DIAGNOSIS — E119 Type 2 diabetes mellitus without complications: Secondary | ICD-10-CM | POA: Diagnosis not present

## 2014-10-05 DIAGNOSIS — Z4789 Encounter for other orthopedic aftercare: Secondary | ICD-10-CM | POA: Diagnosis not present

## 2014-10-05 DIAGNOSIS — M1990 Unspecified osteoarthritis, unspecified site: Secondary | ICD-10-CM | POA: Diagnosis not present

## 2014-10-06 DIAGNOSIS — E119 Type 2 diabetes mellitus without complications: Secondary | ICD-10-CM | POA: Diagnosis not present

## 2014-10-06 DIAGNOSIS — Z4789 Encounter for other orthopedic aftercare: Secondary | ICD-10-CM | POA: Diagnosis not present

## 2014-10-06 DIAGNOSIS — Z87891 Personal history of nicotine dependence: Secondary | ICD-10-CM | POA: Diagnosis not present

## 2014-10-06 DIAGNOSIS — M1990 Unspecified osteoarthritis, unspecified site: Secondary | ICD-10-CM | POA: Diagnosis not present

## 2014-10-06 DIAGNOSIS — F419 Anxiety disorder, unspecified: Secondary | ICD-10-CM | POA: Diagnosis not present

## 2014-10-06 DIAGNOSIS — I10 Essential (primary) hypertension: Secondary | ICD-10-CM | POA: Diagnosis not present

## 2014-10-10 DIAGNOSIS — Z4789 Encounter for other orthopedic aftercare: Secondary | ICD-10-CM | POA: Diagnosis not present

## 2014-10-10 DIAGNOSIS — Z87891 Personal history of nicotine dependence: Secondary | ICD-10-CM | POA: Diagnosis not present

## 2014-10-10 DIAGNOSIS — F419 Anxiety disorder, unspecified: Secondary | ICD-10-CM | POA: Diagnosis not present

## 2014-10-10 DIAGNOSIS — M1990 Unspecified osteoarthritis, unspecified site: Secondary | ICD-10-CM | POA: Diagnosis not present

## 2014-10-10 DIAGNOSIS — E119 Type 2 diabetes mellitus without complications: Secondary | ICD-10-CM | POA: Diagnosis not present

## 2014-10-10 DIAGNOSIS — I10 Essential (primary) hypertension: Secondary | ICD-10-CM | POA: Diagnosis not present

## 2014-10-12 DIAGNOSIS — E119 Type 2 diabetes mellitus without complications: Secondary | ICD-10-CM | POA: Diagnosis not present

## 2014-10-12 DIAGNOSIS — Z87891 Personal history of nicotine dependence: Secondary | ICD-10-CM | POA: Diagnosis not present

## 2014-10-12 DIAGNOSIS — Z4789 Encounter for other orthopedic aftercare: Secondary | ICD-10-CM | POA: Diagnosis not present

## 2014-10-12 DIAGNOSIS — I10 Essential (primary) hypertension: Secondary | ICD-10-CM | POA: Diagnosis not present

## 2014-10-12 DIAGNOSIS — M1990 Unspecified osteoarthritis, unspecified site: Secondary | ICD-10-CM | POA: Diagnosis not present

## 2014-10-12 DIAGNOSIS — F419 Anxiety disorder, unspecified: Secondary | ICD-10-CM | POA: Diagnosis not present

## 2014-10-13 DIAGNOSIS — Z4789 Encounter for other orthopedic aftercare: Secondary | ICD-10-CM | POA: Diagnosis not present

## 2014-10-13 DIAGNOSIS — Z87891 Personal history of nicotine dependence: Secondary | ICD-10-CM | POA: Diagnosis not present

## 2014-10-13 DIAGNOSIS — I10 Essential (primary) hypertension: Secondary | ICD-10-CM | POA: Diagnosis not present

## 2014-10-13 DIAGNOSIS — F419 Anxiety disorder, unspecified: Secondary | ICD-10-CM | POA: Diagnosis not present

## 2014-10-13 DIAGNOSIS — M1990 Unspecified osteoarthritis, unspecified site: Secondary | ICD-10-CM | POA: Diagnosis not present

## 2014-10-13 DIAGNOSIS — E119 Type 2 diabetes mellitus without complications: Secondary | ICD-10-CM | POA: Diagnosis not present

## 2014-10-14 DIAGNOSIS — Z87891 Personal history of nicotine dependence: Secondary | ICD-10-CM | POA: Diagnosis not present

## 2014-10-14 DIAGNOSIS — Z4789 Encounter for other orthopedic aftercare: Secondary | ICD-10-CM | POA: Diagnosis not present

## 2014-10-14 DIAGNOSIS — M1990 Unspecified osteoarthritis, unspecified site: Secondary | ICD-10-CM | POA: Diagnosis not present

## 2014-10-14 DIAGNOSIS — I10 Essential (primary) hypertension: Secondary | ICD-10-CM | POA: Diagnosis not present

## 2014-10-14 DIAGNOSIS — E119 Type 2 diabetes mellitus without complications: Secondary | ICD-10-CM | POA: Diagnosis not present

## 2014-10-14 DIAGNOSIS — F419 Anxiety disorder, unspecified: Secondary | ICD-10-CM | POA: Diagnosis not present

## 2014-10-17 DIAGNOSIS — Z4789 Encounter for other orthopedic aftercare: Secondary | ICD-10-CM | POA: Diagnosis not present

## 2014-10-17 DIAGNOSIS — F419 Anxiety disorder, unspecified: Secondary | ICD-10-CM | POA: Diagnosis not present

## 2014-10-17 DIAGNOSIS — I10 Essential (primary) hypertension: Secondary | ICD-10-CM | POA: Diagnosis not present

## 2014-10-17 DIAGNOSIS — M1990 Unspecified osteoarthritis, unspecified site: Secondary | ICD-10-CM | POA: Diagnosis not present

## 2014-10-17 DIAGNOSIS — Z87891 Personal history of nicotine dependence: Secondary | ICD-10-CM | POA: Diagnosis not present

## 2014-10-17 DIAGNOSIS — E119 Type 2 diabetes mellitus without complications: Secondary | ICD-10-CM | POA: Diagnosis not present

## 2014-10-18 DIAGNOSIS — F419 Anxiety disorder, unspecified: Secondary | ICD-10-CM | POA: Diagnosis not present

## 2014-10-18 DIAGNOSIS — M1990 Unspecified osteoarthritis, unspecified site: Secondary | ICD-10-CM | POA: Diagnosis not present

## 2014-10-18 DIAGNOSIS — Z4789 Encounter for other orthopedic aftercare: Secondary | ICD-10-CM | POA: Diagnosis not present

## 2014-10-18 DIAGNOSIS — Z87891 Personal history of nicotine dependence: Secondary | ICD-10-CM | POA: Diagnosis not present

## 2014-10-18 DIAGNOSIS — I10 Essential (primary) hypertension: Secondary | ICD-10-CM | POA: Diagnosis not present

## 2014-10-18 DIAGNOSIS — E119 Type 2 diabetes mellitus without complications: Secondary | ICD-10-CM | POA: Diagnosis not present

## 2014-10-20 DIAGNOSIS — Z4789 Encounter for other orthopedic aftercare: Secondary | ICD-10-CM | POA: Diagnosis not present

## 2014-10-20 DIAGNOSIS — Z87891 Personal history of nicotine dependence: Secondary | ICD-10-CM | POA: Diagnosis not present

## 2014-10-20 DIAGNOSIS — I10 Essential (primary) hypertension: Secondary | ICD-10-CM | POA: Diagnosis not present

## 2014-10-20 DIAGNOSIS — F419 Anxiety disorder, unspecified: Secondary | ICD-10-CM | POA: Diagnosis not present

## 2014-10-20 DIAGNOSIS — E119 Type 2 diabetes mellitus without complications: Secondary | ICD-10-CM | POA: Diagnosis not present

## 2014-10-20 DIAGNOSIS — M1990 Unspecified osteoarthritis, unspecified site: Secondary | ICD-10-CM | POA: Diagnosis not present

## 2014-10-24 DIAGNOSIS — Z4789 Encounter for other orthopedic aftercare: Secondary | ICD-10-CM | POA: Diagnosis not present

## 2014-10-24 DIAGNOSIS — I10 Essential (primary) hypertension: Secondary | ICD-10-CM | POA: Diagnosis not present

## 2014-10-24 DIAGNOSIS — E119 Type 2 diabetes mellitus without complications: Secondary | ICD-10-CM | POA: Diagnosis not present

## 2014-10-24 DIAGNOSIS — M1990 Unspecified osteoarthritis, unspecified site: Secondary | ICD-10-CM | POA: Diagnosis not present

## 2014-10-24 DIAGNOSIS — F419 Anxiety disorder, unspecified: Secondary | ICD-10-CM | POA: Diagnosis not present

## 2014-10-24 DIAGNOSIS — Z87891 Personal history of nicotine dependence: Secondary | ICD-10-CM | POA: Diagnosis not present

## 2014-10-27 DIAGNOSIS — M1990 Unspecified osteoarthritis, unspecified site: Secondary | ICD-10-CM | POA: Diagnosis not present

## 2014-10-27 DIAGNOSIS — E119 Type 2 diabetes mellitus without complications: Secondary | ICD-10-CM | POA: Diagnosis not present

## 2014-10-27 DIAGNOSIS — Z4789 Encounter for other orthopedic aftercare: Secondary | ICD-10-CM | POA: Diagnosis not present

## 2014-10-27 DIAGNOSIS — Z87891 Personal history of nicotine dependence: Secondary | ICD-10-CM | POA: Diagnosis not present

## 2014-10-27 DIAGNOSIS — F419 Anxiety disorder, unspecified: Secondary | ICD-10-CM | POA: Diagnosis not present

## 2014-10-27 DIAGNOSIS — I10 Essential (primary) hypertension: Secondary | ICD-10-CM | POA: Diagnosis not present

## 2014-10-28 DIAGNOSIS — Z87891 Personal history of nicotine dependence: Secondary | ICD-10-CM | POA: Diagnosis not present

## 2014-10-28 DIAGNOSIS — M1990 Unspecified osteoarthritis, unspecified site: Secondary | ICD-10-CM | POA: Diagnosis not present

## 2014-10-28 DIAGNOSIS — Z4789 Encounter for other orthopedic aftercare: Secondary | ICD-10-CM | POA: Diagnosis not present

## 2014-10-28 DIAGNOSIS — F419 Anxiety disorder, unspecified: Secondary | ICD-10-CM | POA: Diagnosis not present

## 2014-10-28 DIAGNOSIS — I10 Essential (primary) hypertension: Secondary | ICD-10-CM | POA: Diagnosis not present

## 2014-10-28 DIAGNOSIS — E119 Type 2 diabetes mellitus without complications: Secondary | ICD-10-CM | POA: Diagnosis not present

## 2014-10-31 DIAGNOSIS — I10 Essential (primary) hypertension: Secondary | ICD-10-CM | POA: Diagnosis not present

## 2014-10-31 DIAGNOSIS — F419 Anxiety disorder, unspecified: Secondary | ICD-10-CM | POA: Diagnosis not present

## 2014-10-31 DIAGNOSIS — Z87891 Personal history of nicotine dependence: Secondary | ICD-10-CM | POA: Diagnosis not present

## 2014-10-31 DIAGNOSIS — M1990 Unspecified osteoarthritis, unspecified site: Secondary | ICD-10-CM | POA: Diagnosis not present

## 2014-10-31 DIAGNOSIS — E119 Type 2 diabetes mellitus without complications: Secondary | ICD-10-CM | POA: Diagnosis not present

## 2014-10-31 DIAGNOSIS — Z4789 Encounter for other orthopedic aftercare: Secondary | ICD-10-CM | POA: Diagnosis not present

## 2014-11-02 DIAGNOSIS — Z87891 Personal history of nicotine dependence: Secondary | ICD-10-CM | POA: Diagnosis not present

## 2014-11-02 DIAGNOSIS — Z4789 Encounter for other orthopedic aftercare: Secondary | ICD-10-CM | POA: Diagnosis not present

## 2014-11-02 DIAGNOSIS — F419 Anxiety disorder, unspecified: Secondary | ICD-10-CM | POA: Diagnosis not present

## 2014-11-02 DIAGNOSIS — M1990 Unspecified osteoarthritis, unspecified site: Secondary | ICD-10-CM | POA: Diagnosis not present

## 2014-11-02 DIAGNOSIS — E119 Type 2 diabetes mellitus without complications: Secondary | ICD-10-CM | POA: Diagnosis not present

## 2014-11-02 DIAGNOSIS — I10 Essential (primary) hypertension: Secondary | ICD-10-CM | POA: Diagnosis not present

## 2014-11-03 DIAGNOSIS — Z4789 Encounter for other orthopedic aftercare: Secondary | ICD-10-CM | POA: Diagnosis not present

## 2014-11-03 DIAGNOSIS — E119 Type 2 diabetes mellitus without complications: Secondary | ICD-10-CM | POA: Diagnosis not present

## 2014-11-03 DIAGNOSIS — M4316 Spondylolisthesis, lumbar region: Secondary | ICD-10-CM | POA: Diagnosis not present

## 2014-11-03 DIAGNOSIS — Z6831 Body mass index (BMI) 31.0-31.9, adult: Secondary | ICD-10-CM | POA: Diagnosis not present

## 2014-11-03 DIAGNOSIS — I10 Essential (primary) hypertension: Secondary | ICD-10-CM | POA: Diagnosis not present

## 2014-11-03 DIAGNOSIS — Z87891 Personal history of nicotine dependence: Secondary | ICD-10-CM | POA: Diagnosis not present

## 2014-11-03 DIAGNOSIS — M1990 Unspecified osteoarthritis, unspecified site: Secondary | ICD-10-CM | POA: Diagnosis not present

## 2014-11-03 DIAGNOSIS — F419 Anxiety disorder, unspecified: Secondary | ICD-10-CM | POA: Diagnosis not present

## 2014-11-04 DIAGNOSIS — E119 Type 2 diabetes mellitus without complications: Secondary | ICD-10-CM | POA: Diagnosis not present

## 2014-11-04 DIAGNOSIS — Z87891 Personal history of nicotine dependence: Secondary | ICD-10-CM | POA: Diagnosis not present

## 2014-11-04 DIAGNOSIS — I10 Essential (primary) hypertension: Secondary | ICD-10-CM | POA: Diagnosis not present

## 2014-11-04 DIAGNOSIS — Z4789 Encounter for other orthopedic aftercare: Secondary | ICD-10-CM | POA: Diagnosis not present

## 2014-11-04 DIAGNOSIS — F419 Anxiety disorder, unspecified: Secondary | ICD-10-CM | POA: Diagnosis not present

## 2014-11-04 DIAGNOSIS — M1990 Unspecified osteoarthritis, unspecified site: Secondary | ICD-10-CM | POA: Diagnosis not present

## 2014-11-09 DIAGNOSIS — I10 Essential (primary) hypertension: Secondary | ICD-10-CM | POA: Diagnosis not present

## 2014-11-09 DIAGNOSIS — F419 Anxiety disorder, unspecified: Secondary | ICD-10-CM | POA: Diagnosis not present

## 2014-11-09 DIAGNOSIS — Z87891 Personal history of nicotine dependence: Secondary | ICD-10-CM | POA: Diagnosis not present

## 2014-11-09 DIAGNOSIS — M1990 Unspecified osteoarthritis, unspecified site: Secondary | ICD-10-CM | POA: Diagnosis not present

## 2014-11-09 DIAGNOSIS — Z4789 Encounter for other orthopedic aftercare: Secondary | ICD-10-CM | POA: Diagnosis not present

## 2014-11-09 DIAGNOSIS — E119 Type 2 diabetes mellitus without complications: Secondary | ICD-10-CM | POA: Diagnosis not present

## 2014-11-11 DIAGNOSIS — F419 Anxiety disorder, unspecified: Secondary | ICD-10-CM | POA: Diagnosis not present

## 2014-11-11 DIAGNOSIS — I10 Essential (primary) hypertension: Secondary | ICD-10-CM | POA: Diagnosis not present

## 2014-11-11 DIAGNOSIS — Z4789 Encounter for other orthopedic aftercare: Secondary | ICD-10-CM | POA: Diagnosis not present

## 2014-11-11 DIAGNOSIS — Z87891 Personal history of nicotine dependence: Secondary | ICD-10-CM | POA: Diagnosis not present

## 2014-11-11 DIAGNOSIS — E119 Type 2 diabetes mellitus without complications: Secondary | ICD-10-CM | POA: Diagnosis not present

## 2014-11-11 DIAGNOSIS — M1990 Unspecified osteoarthritis, unspecified site: Secondary | ICD-10-CM | POA: Diagnosis not present

## 2014-11-15 DIAGNOSIS — Z4789 Encounter for other orthopedic aftercare: Secondary | ICD-10-CM | POA: Diagnosis not present

## 2014-11-15 DIAGNOSIS — Z87891 Personal history of nicotine dependence: Secondary | ICD-10-CM | POA: Diagnosis not present

## 2014-11-15 DIAGNOSIS — I10 Essential (primary) hypertension: Secondary | ICD-10-CM | POA: Diagnosis not present

## 2014-11-15 DIAGNOSIS — M1990 Unspecified osteoarthritis, unspecified site: Secondary | ICD-10-CM | POA: Diagnosis not present

## 2014-11-15 DIAGNOSIS — E119 Type 2 diabetes mellitus without complications: Secondary | ICD-10-CM | POA: Diagnosis not present

## 2014-11-15 DIAGNOSIS — F419 Anxiety disorder, unspecified: Secondary | ICD-10-CM | POA: Diagnosis not present

## 2014-11-21 DIAGNOSIS — M4316 Spondylolisthesis, lumbar region: Secondary | ICD-10-CM | POA: Diagnosis not present

## 2014-11-21 DIAGNOSIS — Z6832 Body mass index (BMI) 32.0-32.9, adult: Secondary | ICD-10-CM | POA: Diagnosis not present

## 2014-11-21 DIAGNOSIS — I10 Essential (primary) hypertension: Secondary | ICD-10-CM | POA: Diagnosis not present

## 2014-11-22 DIAGNOSIS — F419 Anxiety disorder, unspecified: Secondary | ICD-10-CM | POA: Diagnosis not present

## 2014-11-22 DIAGNOSIS — Z87891 Personal history of nicotine dependence: Secondary | ICD-10-CM | POA: Diagnosis not present

## 2014-11-22 DIAGNOSIS — I10 Essential (primary) hypertension: Secondary | ICD-10-CM | POA: Diagnosis not present

## 2014-11-22 DIAGNOSIS — E119 Type 2 diabetes mellitus without complications: Secondary | ICD-10-CM | POA: Diagnosis not present

## 2014-11-22 DIAGNOSIS — M1990 Unspecified osteoarthritis, unspecified site: Secondary | ICD-10-CM | POA: Diagnosis not present

## 2014-11-22 DIAGNOSIS — Z4789 Encounter for other orthopedic aftercare: Secondary | ICD-10-CM | POA: Diagnosis not present

## 2014-11-24 DIAGNOSIS — E11610 Type 2 diabetes mellitus with diabetic neuropathic arthropathy: Secondary | ICD-10-CM | POA: Diagnosis not present

## 2014-11-24 DIAGNOSIS — I10 Essential (primary) hypertension: Secondary | ICD-10-CM | POA: Diagnosis not present

## 2014-11-24 DIAGNOSIS — M545 Low back pain: Secondary | ICD-10-CM | POA: Diagnosis not present

## 2014-11-29 DIAGNOSIS — Z4789 Encounter for other orthopedic aftercare: Secondary | ICD-10-CM | POA: Diagnosis not present

## 2014-11-29 DIAGNOSIS — M6281 Muscle weakness (generalized): Secondary | ICD-10-CM | POA: Diagnosis not present

## 2014-11-29 DIAGNOSIS — R262 Difficulty in walking, not elsewhere classified: Secondary | ICD-10-CM | POA: Diagnosis not present

## 2014-11-29 DIAGNOSIS — M545 Low back pain: Secondary | ICD-10-CM | POA: Diagnosis not present

## 2014-11-30 DIAGNOSIS — Z4789 Encounter for other orthopedic aftercare: Secondary | ICD-10-CM | POA: Diagnosis not present

## 2014-11-30 DIAGNOSIS — M545 Low back pain: Secondary | ICD-10-CM | POA: Diagnosis not present

## 2014-11-30 DIAGNOSIS — R262 Difficulty in walking, not elsewhere classified: Secondary | ICD-10-CM | POA: Diagnosis not present

## 2014-11-30 DIAGNOSIS — M6281 Muscle weakness (generalized): Secondary | ICD-10-CM | POA: Diagnosis not present

## 2014-12-05 DIAGNOSIS — M545 Low back pain: Secondary | ICD-10-CM | POA: Diagnosis not present

## 2014-12-05 DIAGNOSIS — M6281 Muscle weakness (generalized): Secondary | ICD-10-CM | POA: Diagnosis not present

## 2014-12-05 DIAGNOSIS — R262 Difficulty in walking, not elsewhere classified: Secondary | ICD-10-CM | POA: Diagnosis not present

## 2014-12-05 DIAGNOSIS — Z4789 Encounter for other orthopedic aftercare: Secondary | ICD-10-CM | POA: Diagnosis not present

## 2014-12-08 DIAGNOSIS — M6281 Muscle weakness (generalized): Secondary | ICD-10-CM | POA: Diagnosis not present

## 2014-12-08 DIAGNOSIS — R262 Difficulty in walking, not elsewhere classified: Secondary | ICD-10-CM | POA: Diagnosis not present

## 2014-12-08 DIAGNOSIS — Z4789 Encounter for other orthopedic aftercare: Secondary | ICD-10-CM | POA: Diagnosis not present

## 2014-12-08 DIAGNOSIS — M545 Low back pain: Secondary | ICD-10-CM | POA: Diagnosis not present

## 2014-12-12 DIAGNOSIS — M545 Low back pain: Secondary | ICD-10-CM | POA: Diagnosis not present

## 2014-12-12 DIAGNOSIS — M6281 Muscle weakness (generalized): Secondary | ICD-10-CM | POA: Diagnosis not present

## 2014-12-12 DIAGNOSIS — Z4789 Encounter for other orthopedic aftercare: Secondary | ICD-10-CM | POA: Diagnosis not present

## 2014-12-12 DIAGNOSIS — R262 Difficulty in walking, not elsewhere classified: Secondary | ICD-10-CM | POA: Diagnosis not present

## 2014-12-15 DIAGNOSIS — Z4789 Encounter for other orthopedic aftercare: Secondary | ICD-10-CM | POA: Diagnosis not present

## 2014-12-15 DIAGNOSIS — M545 Low back pain: Secondary | ICD-10-CM | POA: Diagnosis not present

## 2014-12-15 DIAGNOSIS — M6281 Muscle weakness (generalized): Secondary | ICD-10-CM | POA: Diagnosis not present

## 2014-12-15 DIAGNOSIS — R262 Difficulty in walking, not elsewhere classified: Secondary | ICD-10-CM | POA: Diagnosis not present

## 2014-12-19 DIAGNOSIS — Z4789 Encounter for other orthopedic aftercare: Secondary | ICD-10-CM | POA: Diagnosis not present

## 2014-12-19 DIAGNOSIS — M6281 Muscle weakness (generalized): Secondary | ICD-10-CM | POA: Diagnosis not present

## 2014-12-19 DIAGNOSIS — R262 Difficulty in walking, not elsewhere classified: Secondary | ICD-10-CM | POA: Diagnosis not present

## 2014-12-19 DIAGNOSIS — M545 Low back pain: Secondary | ICD-10-CM | POA: Diagnosis not present

## 2014-12-22 DIAGNOSIS — M6281 Muscle weakness (generalized): Secondary | ICD-10-CM | POA: Diagnosis not present

## 2014-12-22 DIAGNOSIS — Z4789 Encounter for other orthopedic aftercare: Secondary | ICD-10-CM | POA: Diagnosis not present

## 2014-12-22 DIAGNOSIS — R262 Difficulty in walking, not elsewhere classified: Secondary | ICD-10-CM | POA: Diagnosis not present

## 2014-12-22 DIAGNOSIS — M545 Low back pain: Secondary | ICD-10-CM | POA: Diagnosis not present

## 2014-12-26 DIAGNOSIS — M545 Low back pain: Secondary | ICD-10-CM | POA: Diagnosis not present

## 2014-12-26 DIAGNOSIS — R262 Difficulty in walking, not elsewhere classified: Secondary | ICD-10-CM | POA: Diagnosis not present

## 2014-12-26 DIAGNOSIS — Z4789 Encounter for other orthopedic aftercare: Secondary | ICD-10-CM | POA: Diagnosis not present

## 2014-12-26 DIAGNOSIS — M6281 Muscle weakness (generalized): Secondary | ICD-10-CM | POA: Diagnosis not present

## 2014-12-29 DIAGNOSIS — Z4789 Encounter for other orthopedic aftercare: Secondary | ICD-10-CM | POA: Diagnosis not present

## 2014-12-29 DIAGNOSIS — R262 Difficulty in walking, not elsewhere classified: Secondary | ICD-10-CM | POA: Diagnosis not present

## 2014-12-29 DIAGNOSIS — M545 Low back pain: Secondary | ICD-10-CM | POA: Diagnosis not present

## 2014-12-29 DIAGNOSIS — M6281 Muscle weakness (generalized): Secondary | ICD-10-CM | POA: Diagnosis not present

## 2015-01-02 DIAGNOSIS — M545 Low back pain: Secondary | ICD-10-CM | POA: Diagnosis not present

## 2015-01-02 DIAGNOSIS — R262 Difficulty in walking, not elsewhere classified: Secondary | ICD-10-CM | POA: Diagnosis not present

## 2015-01-02 DIAGNOSIS — M6281 Muscle weakness (generalized): Secondary | ICD-10-CM | POA: Diagnosis not present

## 2015-01-02 DIAGNOSIS — Z4789 Encounter for other orthopedic aftercare: Secondary | ICD-10-CM | POA: Diagnosis not present

## 2015-01-05 DIAGNOSIS — Z4789 Encounter for other orthopedic aftercare: Secondary | ICD-10-CM | POA: Diagnosis not present

## 2015-01-05 DIAGNOSIS — M545 Low back pain: Secondary | ICD-10-CM | POA: Diagnosis not present

## 2015-01-05 DIAGNOSIS — R262 Difficulty in walking, not elsewhere classified: Secondary | ICD-10-CM | POA: Diagnosis not present

## 2015-01-05 DIAGNOSIS — M6281 Muscle weakness (generalized): Secondary | ICD-10-CM | POA: Diagnosis not present

## 2015-01-09 DIAGNOSIS — M6281 Muscle weakness (generalized): Secondary | ICD-10-CM | POA: Diagnosis not present

## 2015-01-09 DIAGNOSIS — M545 Low back pain: Secondary | ICD-10-CM | POA: Diagnosis not present

## 2015-01-09 DIAGNOSIS — R262 Difficulty in walking, not elsewhere classified: Secondary | ICD-10-CM | POA: Diagnosis not present

## 2015-01-09 DIAGNOSIS — Z4789 Encounter for other orthopedic aftercare: Secondary | ICD-10-CM | POA: Diagnosis not present

## 2015-01-12 DIAGNOSIS — R262 Difficulty in walking, not elsewhere classified: Secondary | ICD-10-CM | POA: Diagnosis not present

## 2015-01-12 DIAGNOSIS — M545 Low back pain: Secondary | ICD-10-CM | POA: Diagnosis not present

## 2015-01-12 DIAGNOSIS — M6281 Muscle weakness (generalized): Secondary | ICD-10-CM | POA: Diagnosis not present

## 2015-01-12 DIAGNOSIS — Z1231 Encounter for screening mammogram for malignant neoplasm of breast: Secondary | ICD-10-CM | POA: Diagnosis not present

## 2015-01-12 DIAGNOSIS — Z4789 Encounter for other orthopedic aftercare: Secondary | ICD-10-CM | POA: Diagnosis not present

## 2015-01-16 DIAGNOSIS — Z4789 Encounter for other orthopedic aftercare: Secondary | ICD-10-CM | POA: Diagnosis not present

## 2015-01-16 DIAGNOSIS — M545 Low back pain: Secondary | ICD-10-CM | POA: Diagnosis not present

## 2015-01-16 DIAGNOSIS — R262 Difficulty in walking, not elsewhere classified: Secondary | ICD-10-CM | POA: Diagnosis not present

## 2015-01-16 DIAGNOSIS — M6281 Muscle weakness (generalized): Secondary | ICD-10-CM | POA: Diagnosis not present

## 2015-01-18 DIAGNOSIS — Z4789 Encounter for other orthopedic aftercare: Secondary | ICD-10-CM | POA: Diagnosis not present

## 2015-01-18 DIAGNOSIS — M6281 Muscle weakness (generalized): Secondary | ICD-10-CM | POA: Diagnosis not present

## 2015-01-18 DIAGNOSIS — M545 Low back pain: Secondary | ICD-10-CM | POA: Diagnosis not present

## 2015-01-18 DIAGNOSIS — R262 Difficulty in walking, not elsewhere classified: Secondary | ICD-10-CM | POA: Diagnosis not present

## 2015-01-23 DIAGNOSIS — Z4789 Encounter for other orthopedic aftercare: Secondary | ICD-10-CM | POA: Diagnosis not present

## 2015-01-23 DIAGNOSIS — M6281 Muscle weakness (generalized): Secondary | ICD-10-CM | POA: Diagnosis not present

## 2015-01-23 DIAGNOSIS — R262 Difficulty in walking, not elsewhere classified: Secondary | ICD-10-CM | POA: Diagnosis not present

## 2015-01-23 DIAGNOSIS — M545 Low back pain: Secondary | ICD-10-CM | POA: Diagnosis not present

## 2015-01-24 DIAGNOSIS — Z23 Encounter for immunization: Secondary | ICD-10-CM | POA: Diagnosis not present

## 2015-01-30 DIAGNOSIS — M545 Low back pain: Secondary | ICD-10-CM | POA: Diagnosis not present

## 2015-01-30 DIAGNOSIS — M6281 Muscle weakness (generalized): Secondary | ICD-10-CM | POA: Diagnosis not present

## 2015-01-30 DIAGNOSIS — R262 Difficulty in walking, not elsewhere classified: Secondary | ICD-10-CM | POA: Diagnosis not present

## 2015-01-30 DIAGNOSIS — Z4789 Encounter for other orthopedic aftercare: Secondary | ICD-10-CM | POA: Diagnosis not present

## 2015-02-02 DIAGNOSIS — Z4789 Encounter for other orthopedic aftercare: Secondary | ICD-10-CM | POA: Diagnosis not present

## 2015-02-02 DIAGNOSIS — M6281 Muscle weakness (generalized): Secondary | ICD-10-CM | POA: Diagnosis not present

## 2015-02-02 DIAGNOSIS — M545 Low back pain: Secondary | ICD-10-CM | POA: Diagnosis not present

## 2015-02-02 DIAGNOSIS — R262 Difficulty in walking, not elsewhere classified: Secondary | ICD-10-CM | POA: Diagnosis not present

## 2015-02-07 DIAGNOSIS — R262 Difficulty in walking, not elsewhere classified: Secondary | ICD-10-CM | POA: Diagnosis not present

## 2015-02-07 DIAGNOSIS — M6281 Muscle weakness (generalized): Secondary | ICD-10-CM | POA: Diagnosis not present

## 2015-02-07 DIAGNOSIS — M545 Low back pain: Secondary | ICD-10-CM | POA: Diagnosis not present

## 2015-02-07 DIAGNOSIS — Z4789 Encounter for other orthopedic aftercare: Secondary | ICD-10-CM | POA: Diagnosis not present

## 2015-02-09 DIAGNOSIS — Z4789 Encounter for other orthopedic aftercare: Secondary | ICD-10-CM | POA: Diagnosis not present

## 2015-02-09 DIAGNOSIS — I10 Essential (primary) hypertension: Secondary | ICD-10-CM | POA: Diagnosis not present

## 2015-02-09 DIAGNOSIS — M545 Low back pain: Secondary | ICD-10-CM | POA: Diagnosis not present

## 2015-02-09 DIAGNOSIS — R262 Difficulty in walking, not elsewhere classified: Secondary | ICD-10-CM | POA: Diagnosis not present

## 2015-02-09 DIAGNOSIS — E11610 Type 2 diabetes mellitus with diabetic neuropathic arthropathy: Secondary | ICD-10-CM | POA: Diagnosis not present

## 2015-02-09 DIAGNOSIS — M6281 Muscle weakness (generalized): Secondary | ICD-10-CM | POA: Diagnosis not present

## 2015-02-13 DIAGNOSIS — I10 Essential (primary) hypertension: Secondary | ICD-10-CM | POA: Diagnosis not present

## 2015-02-13 DIAGNOSIS — Z6832 Body mass index (BMI) 32.0-32.9, adult: Secondary | ICD-10-CM | POA: Diagnosis not present

## 2015-02-13 DIAGNOSIS — M4316 Spondylolisthesis, lumbar region: Secondary | ICD-10-CM | POA: Diagnosis not present

## 2015-02-13 DIAGNOSIS — M5127 Other intervertebral disc displacement, lumbosacral region: Secondary | ICD-10-CM | POA: Diagnosis not present

## 2015-02-14 DIAGNOSIS — Z4789 Encounter for other orthopedic aftercare: Secondary | ICD-10-CM | POA: Diagnosis not present

## 2015-02-14 DIAGNOSIS — M545 Low back pain: Secondary | ICD-10-CM | POA: Diagnosis not present

## 2015-02-14 DIAGNOSIS — M6281 Muscle weakness (generalized): Secondary | ICD-10-CM | POA: Diagnosis not present

## 2015-02-14 DIAGNOSIS — R262 Difficulty in walking, not elsewhere classified: Secondary | ICD-10-CM | POA: Diagnosis not present

## 2015-02-21 DIAGNOSIS — M6281 Muscle weakness (generalized): Secondary | ICD-10-CM | POA: Diagnosis not present

## 2015-02-21 DIAGNOSIS — M545 Low back pain: Secondary | ICD-10-CM | POA: Diagnosis not present

## 2015-02-21 DIAGNOSIS — Z4789 Encounter for other orthopedic aftercare: Secondary | ICD-10-CM | POA: Diagnosis not present

## 2015-02-21 DIAGNOSIS — R262 Difficulty in walking, not elsewhere classified: Secondary | ICD-10-CM | POA: Diagnosis not present

## 2015-02-28 DIAGNOSIS — Z4789 Encounter for other orthopedic aftercare: Secondary | ICD-10-CM | POA: Diagnosis not present

## 2015-02-28 DIAGNOSIS — M545 Low back pain: Secondary | ICD-10-CM | POA: Diagnosis not present

## 2015-02-28 DIAGNOSIS — R262 Difficulty in walking, not elsewhere classified: Secondary | ICD-10-CM | POA: Diagnosis not present

## 2015-02-28 DIAGNOSIS — M6281 Muscle weakness (generalized): Secondary | ICD-10-CM | POA: Diagnosis not present

## 2015-03-07 DIAGNOSIS — M545 Low back pain: Secondary | ICD-10-CM | POA: Diagnosis not present

## 2015-03-07 DIAGNOSIS — R262 Difficulty in walking, not elsewhere classified: Secondary | ICD-10-CM | POA: Diagnosis not present

## 2015-03-07 DIAGNOSIS — M6281 Muscle weakness (generalized): Secondary | ICD-10-CM | POA: Diagnosis not present

## 2015-03-07 DIAGNOSIS — Z4789 Encounter for other orthopedic aftercare: Secondary | ICD-10-CM | POA: Diagnosis not present

## 2015-03-16 DIAGNOSIS — E119 Type 2 diabetes mellitus without complications: Secondary | ICD-10-CM | POA: Diagnosis not present

## 2015-03-21 DIAGNOSIS — H8113 Benign paroxysmal vertigo, bilateral: Secondary | ICD-10-CM | POA: Diagnosis not present

## 2015-04-11 DIAGNOSIS — I10 Essential (primary) hypertension: Secondary | ICD-10-CM | POA: Diagnosis not present

## 2015-04-11 DIAGNOSIS — E1165 Type 2 diabetes mellitus with hyperglycemia: Secondary | ICD-10-CM | POA: Diagnosis not present

## 2015-06-05 DIAGNOSIS — I10 Essential (primary) hypertension: Secondary | ICD-10-CM | POA: Diagnosis not present

## 2015-06-05 DIAGNOSIS — Z6833 Body mass index (BMI) 33.0-33.9, adult: Secondary | ICD-10-CM | POA: Diagnosis not present

## 2015-06-05 DIAGNOSIS — M5127 Other intervertebral disc displacement, lumbosacral region: Secondary | ICD-10-CM | POA: Diagnosis not present

## 2015-06-09 DIAGNOSIS — I10 Essential (primary) hypertension: Secondary | ICD-10-CM | POA: Diagnosis not present

## 2015-06-09 DIAGNOSIS — E1121 Type 2 diabetes mellitus with diabetic nephropathy: Secondary | ICD-10-CM | POA: Diagnosis not present

## 2015-06-09 DIAGNOSIS — Z0000 Encounter for general adult medical examination without abnormal findings: Secondary | ICD-10-CM | POA: Diagnosis not present

## 2015-06-09 DIAGNOSIS — Z1389 Encounter for screening for other disorder: Secondary | ICD-10-CM | POA: Diagnosis not present

## 2015-09-06 DIAGNOSIS — I10 Essential (primary) hypertension: Secondary | ICD-10-CM | POA: Diagnosis not present

## 2015-09-06 DIAGNOSIS — E1121 Type 2 diabetes mellitus with diabetic nephropathy: Secondary | ICD-10-CM | POA: Diagnosis not present

## 2015-09-08 DIAGNOSIS — I10 Essential (primary) hypertension: Secondary | ICD-10-CM | POA: Diagnosis not present

## 2015-09-08 DIAGNOSIS — E1121 Type 2 diabetes mellitus with diabetic nephropathy: Secondary | ICD-10-CM | POA: Diagnosis not present

## 2015-09-27 DIAGNOSIS — I10 Essential (primary) hypertension: Secondary | ICD-10-CM | POA: Diagnosis not present

## 2015-09-27 DIAGNOSIS — E1121 Type 2 diabetes mellitus with diabetic nephropathy: Secondary | ICD-10-CM | POA: Diagnosis not present

## 2015-10-26 DIAGNOSIS — E1121 Type 2 diabetes mellitus with diabetic nephropathy: Secondary | ICD-10-CM | POA: Diagnosis not present

## 2015-10-26 DIAGNOSIS — I10 Essential (primary) hypertension: Secondary | ICD-10-CM | POA: Diagnosis not present

## 2015-11-30 DIAGNOSIS — I10 Essential (primary) hypertension: Secondary | ICD-10-CM | POA: Diagnosis not present

## 2015-11-30 DIAGNOSIS — E1121 Type 2 diabetes mellitus with diabetic nephropathy: Secondary | ICD-10-CM | POA: Diagnosis not present

## 2015-12-11 DIAGNOSIS — I10 Essential (primary) hypertension: Secondary | ICD-10-CM | POA: Diagnosis not present

## 2015-12-11 DIAGNOSIS — E1121 Type 2 diabetes mellitus with diabetic nephropathy: Secondary | ICD-10-CM | POA: Diagnosis not present

## 2016-01-15 DIAGNOSIS — Z1231 Encounter for screening mammogram for malignant neoplasm of breast: Secondary | ICD-10-CM | POA: Diagnosis not present

## 2016-01-23 DIAGNOSIS — E1143 Type 2 diabetes mellitus with diabetic autonomic (poly)neuropathy: Secondary | ICD-10-CM | POA: Diagnosis not present

## 2016-01-23 DIAGNOSIS — M545 Low back pain: Secondary | ICD-10-CM | POA: Diagnosis not present

## 2016-01-24 DIAGNOSIS — E1121 Type 2 diabetes mellitus with diabetic nephropathy: Secondary | ICD-10-CM | POA: Diagnosis not present

## 2016-01-24 DIAGNOSIS — I10 Essential (primary) hypertension: Secondary | ICD-10-CM | POA: Diagnosis not present

## 2016-02-05 DIAGNOSIS — J40 Bronchitis, not specified as acute or chronic: Secondary | ICD-10-CM | POA: Diagnosis not present

## 2016-02-05 DIAGNOSIS — R0602 Shortness of breath: Secondary | ICD-10-CM | POA: Diagnosis not present

## 2016-02-06 DIAGNOSIS — R0602 Shortness of breath: Secondary | ICD-10-CM | POA: Diagnosis not present

## 2016-02-21 DIAGNOSIS — Z87891 Personal history of nicotine dependence: Secondary | ICD-10-CM | POA: Diagnosis not present

## 2016-02-21 DIAGNOSIS — Z79899 Other long term (current) drug therapy: Secondary | ICD-10-CM | POA: Diagnosis not present

## 2016-02-21 DIAGNOSIS — R0602 Shortness of breath: Secondary | ICD-10-CM | POA: Diagnosis not present

## 2016-02-21 DIAGNOSIS — E7800 Pure hypercholesterolemia, unspecified: Secondary | ICD-10-CM | POA: Diagnosis not present

## 2016-02-21 DIAGNOSIS — E119 Type 2 diabetes mellitus without complications: Secondary | ICD-10-CM | POA: Diagnosis not present

## 2016-02-21 DIAGNOSIS — R05 Cough: Secondary | ICD-10-CM | POA: Diagnosis not present

## 2016-02-21 DIAGNOSIS — J181 Lobar pneumonia, unspecified organism: Secondary | ICD-10-CM | POA: Diagnosis not present

## 2016-02-21 DIAGNOSIS — J189 Pneumonia, unspecified organism: Secondary | ICD-10-CM | POA: Diagnosis not present

## 2016-02-21 DIAGNOSIS — K219 Gastro-esophageal reflux disease without esophagitis: Secondary | ICD-10-CM | POA: Diagnosis not present

## 2016-02-21 DIAGNOSIS — Z7984 Long term (current) use of oral hypoglycemic drugs: Secondary | ICD-10-CM | POA: Diagnosis not present

## 2016-02-21 DIAGNOSIS — I10 Essential (primary) hypertension: Secondary | ICD-10-CM | POA: Diagnosis not present

## 2016-03-13 DIAGNOSIS — E1121 Type 2 diabetes mellitus with diabetic nephropathy: Secondary | ICD-10-CM | POA: Diagnosis not present

## 2016-03-13 DIAGNOSIS — I10 Essential (primary) hypertension: Secondary | ICD-10-CM | POA: Diagnosis not present

## 2016-03-14 DIAGNOSIS — E1143 Type 2 diabetes mellitus with diabetic autonomic (poly)neuropathy: Secondary | ICD-10-CM | POA: Diagnosis not present

## 2016-03-14 DIAGNOSIS — G4762 Sleep related leg cramps: Secondary | ICD-10-CM | POA: Diagnosis not present

## 2016-03-14 DIAGNOSIS — M545 Low back pain: Secondary | ICD-10-CM | POA: Diagnosis not present

## 2016-04-02 DIAGNOSIS — I10 Essential (primary) hypertension: Secondary | ICD-10-CM | POA: Diagnosis not present

## 2016-04-02 DIAGNOSIS — Z23 Encounter for immunization: Secondary | ICD-10-CM | POA: Diagnosis not present

## 2016-04-02 DIAGNOSIS — I2699 Other pulmonary embolism without acute cor pulmonale: Secondary | ICD-10-CM | POA: Diagnosis not present

## 2016-04-02 DIAGNOSIS — E1142 Type 2 diabetes mellitus with diabetic polyneuropathy: Secondary | ICD-10-CM | POA: Diagnosis not present

## 2016-04-02 DIAGNOSIS — Z79899 Other long term (current) drug therapy: Secondary | ICD-10-CM | POA: Diagnosis not present

## 2016-04-02 DIAGNOSIS — F411 Generalized anxiety disorder: Secondary | ICD-10-CM | POA: Diagnosis not present

## 2016-04-02 DIAGNOSIS — E1140 Type 2 diabetes mellitus with diabetic neuropathy, unspecified: Secondary | ICD-10-CM | POA: Diagnosis present

## 2016-04-02 DIAGNOSIS — E784 Other hyperlipidemia: Secondary | ICD-10-CM | POA: Diagnosis not present

## 2016-04-02 DIAGNOSIS — Z803 Family history of malignant neoplasm of breast: Secondary | ICD-10-CM | POA: Diagnosis not present

## 2016-04-02 DIAGNOSIS — Z833 Family history of diabetes mellitus: Secondary | ICD-10-CM | POA: Diagnosis not present

## 2016-04-02 DIAGNOSIS — Z888 Allergy status to other drugs, medicaments and biological substances status: Secondary | ICD-10-CM | POA: Diagnosis not present

## 2016-04-02 DIAGNOSIS — E785 Hyperlipidemia, unspecified: Secondary | ICD-10-CM | POA: Diagnosis present

## 2016-04-02 DIAGNOSIS — R0602 Shortness of breath: Secondary | ICD-10-CM | POA: Diagnosis not present

## 2016-04-02 DIAGNOSIS — K449 Diaphragmatic hernia without obstruction or gangrene: Secondary | ICD-10-CM | POA: Diagnosis present

## 2016-04-02 DIAGNOSIS — Z7984 Long term (current) use of oral hypoglycemic drugs: Secondary | ICD-10-CM | POA: Diagnosis not present

## 2016-04-15 DIAGNOSIS — I2782 Chronic pulmonary embolism: Secondary | ICD-10-CM | POA: Diagnosis not present

## 2016-04-15 DIAGNOSIS — E1143 Type 2 diabetes mellitus with diabetic autonomic (poly)neuropathy: Secondary | ICD-10-CM | POA: Diagnosis not present

## 2016-04-15 DIAGNOSIS — Z79891 Long term (current) use of opiate analgesic: Secondary | ICD-10-CM | POA: Diagnosis not present

## 2016-04-15 DIAGNOSIS — Z79899 Other long term (current) drug therapy: Secondary | ICD-10-CM | POA: Diagnosis not present

## 2016-04-15 DIAGNOSIS — M545 Low back pain: Secondary | ICD-10-CM | POA: Diagnosis not present

## 2016-04-22 DIAGNOSIS — R42 Dizziness and giddiness: Secondary | ICD-10-CM | POA: Diagnosis not present

## 2016-04-22 DIAGNOSIS — Z86711 Personal history of pulmonary embolism: Secondary | ICD-10-CM | POA: Diagnosis not present

## 2016-04-22 DIAGNOSIS — I10 Essential (primary) hypertension: Secondary | ICD-10-CM | POA: Diagnosis not present

## 2016-04-22 DIAGNOSIS — Z7902 Long term (current) use of antithrombotics/antiplatelets: Secondary | ICD-10-CM | POA: Diagnosis not present

## 2016-04-22 DIAGNOSIS — R0602 Shortness of breath: Secondary | ICD-10-CM | POA: Diagnosis not present

## 2016-04-22 DIAGNOSIS — Z87891 Personal history of nicotine dependence: Secondary | ICD-10-CM | POA: Diagnosis not present

## 2016-04-22 DIAGNOSIS — Z79899 Other long term (current) drug therapy: Secondary | ICD-10-CM | POA: Diagnosis not present

## 2016-04-22 DIAGNOSIS — E1165 Type 2 diabetes mellitus with hyperglycemia: Secondary | ICD-10-CM | POA: Diagnosis not present

## 2016-05-16 DIAGNOSIS — I10 Essential (primary) hypertension: Secondary | ICD-10-CM | POA: Diagnosis not present

## 2016-05-16 DIAGNOSIS — L2089 Other atopic dermatitis: Secondary | ICD-10-CM | POA: Diagnosis not present

## 2016-05-16 DIAGNOSIS — I2782 Chronic pulmonary embolism: Secondary | ICD-10-CM | POA: Diagnosis not present

## 2016-05-16 DIAGNOSIS — Z79899 Other long term (current) drug therapy: Secondary | ICD-10-CM | POA: Diagnosis not present

## 2016-05-16 DIAGNOSIS — E1143 Type 2 diabetes mellitus with diabetic autonomic (poly)neuropathy: Secondary | ICD-10-CM | POA: Diagnosis not present

## 2016-06-18 DIAGNOSIS — Z79899 Other long term (current) drug therapy: Secondary | ICD-10-CM | POA: Diagnosis not present

## 2016-06-18 DIAGNOSIS — K219 Gastro-esophageal reflux disease without esophagitis: Secondary | ICD-10-CM | POA: Diagnosis not present

## 2016-06-18 DIAGNOSIS — E7800 Pure hypercholesterolemia, unspecified: Secondary | ICD-10-CM | POA: Diagnosis not present

## 2016-06-18 DIAGNOSIS — Z90710 Acquired absence of both cervix and uterus: Secondary | ICD-10-CM | POA: Diagnosis not present

## 2016-06-18 DIAGNOSIS — Z7901 Long term (current) use of anticoagulants: Secondary | ICD-10-CM | POA: Diagnosis not present

## 2016-06-18 DIAGNOSIS — Z9889 Other specified postprocedural states: Secondary | ICD-10-CM | POA: Diagnosis not present

## 2016-06-18 DIAGNOSIS — K449 Diaphragmatic hernia without obstruction or gangrene: Secondary | ICD-10-CM | POA: Diagnosis not present

## 2016-06-18 DIAGNOSIS — Z87891 Personal history of nicotine dependence: Secondary | ICD-10-CM | POA: Diagnosis not present

## 2016-06-18 DIAGNOSIS — R935 Abnormal findings on diagnostic imaging of other abdominal regions, including retroperitoneum: Secondary | ICD-10-CM | POA: Diagnosis not present

## 2016-06-18 DIAGNOSIS — Z96652 Presence of left artificial knee joint: Secondary | ICD-10-CM | POA: Diagnosis not present

## 2016-06-18 DIAGNOSIS — E119 Type 2 diabetes mellitus without complications: Secondary | ICD-10-CM | POA: Diagnosis not present

## 2016-06-18 DIAGNOSIS — R42 Dizziness and giddiness: Secondary | ICD-10-CM | POA: Diagnosis not present

## 2016-06-18 DIAGNOSIS — I10 Essential (primary) hypertension: Secondary | ICD-10-CM | POA: Diagnosis not present

## 2016-06-18 DIAGNOSIS — R1013 Epigastric pain: Secondary | ICD-10-CM | POA: Diagnosis not present

## 2016-06-18 DIAGNOSIS — R110 Nausea: Secondary | ICD-10-CM | POA: Diagnosis not present

## 2016-06-20 DIAGNOSIS — R1013 Epigastric pain: Secondary | ICD-10-CM | POA: Diagnosis not present

## 2016-06-20 DIAGNOSIS — K219 Gastro-esophageal reflux disease without esophagitis: Secondary | ICD-10-CM | POA: Diagnosis not present

## 2016-06-20 DIAGNOSIS — I2782 Chronic pulmonary embolism: Secondary | ICD-10-CM | POA: Diagnosis not present

## 2016-06-20 DIAGNOSIS — Z803 Family history of malignant neoplasm of breast: Secondary | ICD-10-CM | POA: Diagnosis not present

## 2016-06-20 DIAGNOSIS — R1011 Right upper quadrant pain: Secondary | ICD-10-CM | POA: Diagnosis not present

## 2016-06-20 DIAGNOSIS — D638 Anemia in other chronic diseases classified elsewhere: Secondary | ICD-10-CM | POA: Diagnosis not present

## 2016-06-20 DIAGNOSIS — Z886 Allergy status to analgesic agent status: Secondary | ICD-10-CM | POA: Diagnosis not present

## 2016-06-20 DIAGNOSIS — R079 Chest pain, unspecified: Secondary | ICD-10-CM | POA: Diagnosis not present

## 2016-06-20 DIAGNOSIS — E119 Type 2 diabetes mellitus without complications: Secondary | ICD-10-CM | POA: Diagnosis not present

## 2016-06-20 DIAGNOSIS — Z7902 Long term (current) use of antithrombotics/antiplatelets: Secondary | ICD-10-CM | POA: Diagnosis not present

## 2016-06-20 DIAGNOSIS — R110 Nausea: Secondary | ICD-10-CM | POA: Diagnosis not present

## 2016-06-20 DIAGNOSIS — F411 Generalized anxiety disorder: Secondary | ICD-10-CM | POA: Diagnosis not present

## 2016-06-20 DIAGNOSIS — E860 Dehydration: Secondary | ICD-10-CM | POA: Diagnosis not present

## 2016-06-20 DIAGNOSIS — K760 Fatty (change of) liver, not elsewhere classified: Secondary | ICD-10-CM | POA: Diagnosis not present

## 2016-06-20 DIAGNOSIS — Z90710 Acquired absence of both cervix and uterus: Secondary | ICD-10-CM | POA: Diagnosis not present

## 2016-06-20 DIAGNOSIS — N23 Unspecified renal colic: Secondary | ICD-10-CM | POA: Diagnosis not present

## 2016-06-20 DIAGNOSIS — R0789 Other chest pain: Secondary | ICD-10-CM | POA: Diagnosis not present

## 2016-06-20 DIAGNOSIS — Z7984 Long term (current) use of oral hypoglycemic drugs: Secondary | ICD-10-CM | POA: Diagnosis not present

## 2016-06-20 DIAGNOSIS — N200 Calculus of kidney: Secondary | ICD-10-CM | POA: Diagnosis not present

## 2016-06-20 DIAGNOSIS — Z79899 Other long term (current) drug therapy: Secondary | ICD-10-CM | POA: Diagnosis not present

## 2016-06-20 DIAGNOSIS — Z833 Family history of diabetes mellitus: Secondary | ICD-10-CM | POA: Diagnosis not present

## 2016-06-20 DIAGNOSIS — N390 Urinary tract infection, site not specified: Secondary | ICD-10-CM | POA: Diagnosis not present

## 2016-06-20 DIAGNOSIS — R42 Dizziness and giddiness: Secondary | ICD-10-CM | POA: Diagnosis not present

## 2016-06-20 DIAGNOSIS — Z96652 Presence of left artificial knee joint: Secondary | ICD-10-CM | POA: Diagnosis not present

## 2016-06-20 DIAGNOSIS — E785 Hyperlipidemia, unspecified: Secondary | ICD-10-CM | POA: Diagnosis not present

## 2016-06-20 DIAGNOSIS — Z87891 Personal history of nicotine dependence: Secondary | ICD-10-CM | POA: Diagnosis not present

## 2016-06-20 DIAGNOSIS — K449 Diaphragmatic hernia without obstruction or gangrene: Secondary | ICD-10-CM | POA: Diagnosis not present

## 2016-06-20 DIAGNOSIS — I10 Essential (primary) hypertension: Secondary | ICD-10-CM | POA: Diagnosis not present

## 2016-06-21 DIAGNOSIS — R109 Unspecified abdominal pain: Secondary | ICD-10-CM | POA: Diagnosis not present

## 2016-06-21 DIAGNOSIS — N200 Calculus of kidney: Secondary | ICD-10-CM | POA: Diagnosis not present

## 2016-06-21 DIAGNOSIS — D638 Anemia in other chronic diseases classified elsewhere: Secondary | ICD-10-CM | POA: Diagnosis not present

## 2016-06-21 DIAGNOSIS — N390 Urinary tract infection, site not specified: Secondary | ICD-10-CM | POA: Diagnosis not present

## 2016-06-21 DIAGNOSIS — R1011 Right upper quadrant pain: Secondary | ICD-10-CM | POA: Diagnosis not present

## 2016-06-21 DIAGNOSIS — E119 Type 2 diabetes mellitus without complications: Secondary | ICD-10-CM | POA: Diagnosis not present

## 2016-06-22 DIAGNOSIS — R1011 Right upper quadrant pain: Secondary | ICD-10-CM | POA: Diagnosis not present

## 2016-06-22 DIAGNOSIS — E119 Type 2 diabetes mellitus without complications: Secondary | ICD-10-CM | POA: Diagnosis not present

## 2016-06-22 DIAGNOSIS — N390 Urinary tract infection, site not specified: Secondary | ICD-10-CM | POA: Diagnosis not present

## 2016-06-22 DIAGNOSIS — N200 Calculus of kidney: Secondary | ICD-10-CM | POA: Diagnosis not present

## 2016-06-22 DIAGNOSIS — D638 Anemia in other chronic diseases classified elsewhere: Secondary | ICD-10-CM | POA: Diagnosis not present

## 2016-06-25 DIAGNOSIS — N200 Calculus of kidney: Secondary | ICD-10-CM | POA: Diagnosis not present

## 2016-07-14 DIAGNOSIS — R1110 Vomiting, unspecified: Secondary | ICD-10-CM | POA: Diagnosis not present

## 2016-07-14 DIAGNOSIS — N200 Calculus of kidney: Secondary | ICD-10-CM | POA: Diagnosis not present

## 2016-07-14 DIAGNOSIS — E1165 Type 2 diabetes mellitus with hyperglycemia: Secondary | ICD-10-CM | POA: Diagnosis not present

## 2016-07-14 DIAGNOSIS — Z7984 Long term (current) use of oral hypoglycemic drugs: Secondary | ICD-10-CM | POA: Diagnosis not present

## 2016-07-14 DIAGNOSIS — Z87891 Personal history of nicotine dependence: Secondary | ICD-10-CM | POA: Diagnosis not present

## 2016-07-14 DIAGNOSIS — Z79899 Other long term (current) drug therapy: Secondary | ICD-10-CM | POA: Diagnosis not present

## 2016-07-14 DIAGNOSIS — E860 Dehydration: Secondary | ICD-10-CM | POA: Diagnosis not present

## 2016-07-14 DIAGNOSIS — K219 Gastro-esophageal reflux disease without esophagitis: Secondary | ICD-10-CM | POA: Diagnosis not present

## 2016-07-14 DIAGNOSIS — Z7901 Long term (current) use of anticoagulants: Secondary | ICD-10-CM | POA: Diagnosis not present

## 2016-07-14 DIAGNOSIS — I10 Essential (primary) hypertension: Secondary | ICD-10-CM | POA: Diagnosis not present

## 2016-07-14 DIAGNOSIS — Z86711 Personal history of pulmonary embolism: Secondary | ICD-10-CM | POA: Diagnosis not present

## 2016-07-14 DIAGNOSIS — K529 Noninfective gastroenteritis and colitis, unspecified: Secondary | ICD-10-CM | POA: Diagnosis not present

## 2016-07-14 DIAGNOSIS — R1031 Right lower quadrant pain: Secondary | ICD-10-CM | POA: Diagnosis not present

## 2016-07-14 DIAGNOSIS — E7800 Pure hypercholesterolemia, unspecified: Secondary | ICD-10-CM | POA: Diagnosis not present

## 2016-07-16 DIAGNOSIS — K2900 Acute gastritis without bleeding: Secondary | ICD-10-CM | POA: Diagnosis not present

## 2016-07-24 DIAGNOSIS — E1121 Type 2 diabetes mellitus with diabetic nephropathy: Secondary | ICD-10-CM | POA: Diagnosis not present

## 2016-07-24 DIAGNOSIS — I10 Essential (primary) hypertension: Secondary | ICD-10-CM | POA: Diagnosis not present

## 2016-08-05 DIAGNOSIS — J218 Acute bronchiolitis due to other specified organisms: Secondary | ICD-10-CM | POA: Diagnosis not present

## 2016-09-13 DIAGNOSIS — E119 Type 2 diabetes mellitus without complications: Secondary | ICD-10-CM | POA: Diagnosis not present

## 2016-09-13 DIAGNOSIS — I10 Essential (primary) hypertension: Secondary | ICD-10-CM | POA: Diagnosis not present

## 2016-09-13 DIAGNOSIS — E1143 Type 2 diabetes mellitus with diabetic autonomic (poly)neuropathy: Secondary | ICD-10-CM | POA: Diagnosis not present

## 2016-10-10 DIAGNOSIS — I10 Essential (primary) hypertension: Secondary | ICD-10-CM | POA: Diagnosis not present

## 2016-10-10 DIAGNOSIS — E1121 Type 2 diabetes mellitus with diabetic nephropathy: Secondary | ICD-10-CM | POA: Diagnosis not present

## 2016-10-31 DIAGNOSIS — E1143 Type 2 diabetes mellitus with diabetic autonomic (poly)neuropathy: Secondary | ICD-10-CM | POA: Diagnosis not present

## 2016-10-31 DIAGNOSIS — L84 Corns and callosities: Secondary | ICD-10-CM | POA: Diagnosis not present

## 2016-10-31 DIAGNOSIS — B351 Tinea unguium: Secondary | ICD-10-CM | POA: Diagnosis not present

## 2016-11-11 DIAGNOSIS — I10 Essential (primary) hypertension: Secondary | ICD-10-CM | POA: Diagnosis not present

## 2016-11-11 DIAGNOSIS — E1121 Type 2 diabetes mellitus with diabetic nephropathy: Secondary | ICD-10-CM | POA: Diagnosis not present

## 2017-01-07 DIAGNOSIS — L600 Ingrowing nail: Secondary | ICD-10-CM | POA: Diagnosis not present

## 2017-01-16 DIAGNOSIS — Z1231 Encounter for screening mammogram for malignant neoplasm of breast: Secondary | ICD-10-CM | POA: Diagnosis not present

## 2017-01-22 DIAGNOSIS — I10 Essential (primary) hypertension: Secondary | ICD-10-CM | POA: Diagnosis not present

## 2017-01-22 DIAGNOSIS — E1121 Type 2 diabetes mellitus with diabetic nephropathy: Secondary | ICD-10-CM | POA: Diagnosis not present

## 2017-02-04 DIAGNOSIS — Z23 Encounter for immunization: Secondary | ICD-10-CM | POA: Diagnosis not present

## 2017-02-19 DIAGNOSIS — I10 Essential (primary) hypertension: Secondary | ICD-10-CM | POA: Diagnosis not present

## 2017-02-19 DIAGNOSIS — E1121 Type 2 diabetes mellitus with diabetic nephropathy: Secondary | ICD-10-CM | POA: Diagnosis not present

## 2017-03-24 DIAGNOSIS — L600 Ingrowing nail: Secondary | ICD-10-CM | POA: Diagnosis not present

## 2017-03-24 DIAGNOSIS — K210 Gastro-esophageal reflux disease with esophagitis: Secondary | ICD-10-CM | POA: Diagnosis not present

## 2017-03-24 DIAGNOSIS — M545 Low back pain: Secondary | ICD-10-CM | POA: Diagnosis not present

## 2017-03-24 DIAGNOSIS — E1143 Type 2 diabetes mellitus with diabetic autonomic (poly)neuropathy: Secondary | ICD-10-CM | POA: Diagnosis not present

## 2017-03-24 DIAGNOSIS — I10 Essential (primary) hypertension: Secondary | ICD-10-CM | POA: Diagnosis not present

## 2017-04-09 DIAGNOSIS — K210 Gastro-esophageal reflux disease with esophagitis: Secondary | ICD-10-CM | POA: Diagnosis not present

## 2017-04-09 DIAGNOSIS — E1121 Type 2 diabetes mellitus with diabetic nephropathy: Secondary | ICD-10-CM | POA: Diagnosis not present

## 2017-04-09 DIAGNOSIS — I10 Essential (primary) hypertension: Secondary | ICD-10-CM | POA: Diagnosis not present

## 2017-04-24 DIAGNOSIS — M810 Age-related osteoporosis without current pathological fracture: Secondary | ICD-10-CM | POA: Diagnosis not present

## 2017-04-24 DIAGNOSIS — Z780 Asymptomatic menopausal state: Secondary | ICD-10-CM | POA: Diagnosis not present

## 2017-05-21 DIAGNOSIS — E1121 Type 2 diabetes mellitus with diabetic nephropathy: Secondary | ICD-10-CM | POA: Diagnosis not present

## 2017-05-21 DIAGNOSIS — I10 Essential (primary) hypertension: Secondary | ICD-10-CM | POA: Diagnosis not present

## 2017-05-21 DIAGNOSIS — K210 Gastro-esophageal reflux disease with esophagitis: Secondary | ICD-10-CM | POA: Diagnosis not present

## 2017-06-02 DIAGNOSIS — M545 Low back pain: Secondary | ICD-10-CM | POA: Diagnosis not present

## 2017-06-19 DIAGNOSIS — K210 Gastro-esophageal reflux disease with esophagitis: Secondary | ICD-10-CM | POA: Diagnosis not present

## 2017-06-19 DIAGNOSIS — I10 Essential (primary) hypertension: Secondary | ICD-10-CM | POA: Diagnosis not present

## 2017-06-19 DIAGNOSIS — E1149 Type 2 diabetes mellitus with other diabetic neurological complication: Secondary | ICD-10-CM | POA: Diagnosis not present

## 2017-06-20 DIAGNOSIS — E1149 Type 2 diabetes mellitus with other diabetic neurological complication: Secondary | ICD-10-CM | POA: Diagnosis not present

## 2017-06-20 DIAGNOSIS — I10 Essential (primary) hypertension: Secondary | ICD-10-CM | POA: Diagnosis not present

## 2017-06-20 DIAGNOSIS — K210 Gastro-esophageal reflux disease with esophagitis: Secondary | ICD-10-CM | POA: Diagnosis not present

## 2017-08-09 DIAGNOSIS — E119 Type 2 diabetes mellitus without complications: Secondary | ICD-10-CM | POA: Diagnosis not present

## 2017-08-09 DIAGNOSIS — Z7984 Long term (current) use of oral hypoglycemic drugs: Secondary | ICD-10-CM | POA: Diagnosis not present

## 2017-08-09 DIAGNOSIS — Z79899 Other long term (current) drug therapy: Secondary | ICD-10-CM | POA: Diagnosis not present

## 2017-08-09 DIAGNOSIS — I2782 Chronic pulmonary embolism: Secondary | ICD-10-CM | POA: Diagnosis not present

## 2017-08-09 DIAGNOSIS — I10 Essential (primary) hypertension: Secondary | ICD-10-CM | POA: Diagnosis not present

## 2017-08-09 DIAGNOSIS — Z803 Family history of malignant neoplasm of breast: Secondary | ICD-10-CM | POA: Diagnosis not present

## 2017-08-09 DIAGNOSIS — K219 Gastro-esophageal reflux disease without esophagitis: Secondary | ICD-10-CM | POA: Diagnosis not present

## 2017-08-09 DIAGNOSIS — K449 Diaphragmatic hernia without obstruction or gangrene: Secondary | ICD-10-CM | POA: Diagnosis not present

## 2017-08-09 DIAGNOSIS — Z96652 Presence of left artificial knee joint: Secondary | ICD-10-CM | POA: Diagnosis not present

## 2017-08-09 DIAGNOSIS — Z87891 Personal history of nicotine dependence: Secondary | ICD-10-CM | POA: Diagnosis not present

## 2017-08-09 DIAGNOSIS — F411 Generalized anxiety disorder: Secondary | ICD-10-CM | POA: Diagnosis not present

## 2017-08-09 DIAGNOSIS — R112 Nausea with vomiting, unspecified: Secondary | ICD-10-CM | POA: Diagnosis not present

## 2017-08-09 DIAGNOSIS — R531 Weakness: Secondary | ICD-10-CM | POA: Diagnosis not present

## 2017-08-09 DIAGNOSIS — Z90710 Acquired absence of both cervix and uterus: Secondary | ICD-10-CM | POA: Diagnosis not present

## 2017-08-09 DIAGNOSIS — R079 Chest pain, unspecified: Secondary | ICD-10-CM | POA: Diagnosis not present

## 2017-08-09 DIAGNOSIS — I209 Angina pectoris, unspecified: Secondary | ICD-10-CM | POA: Diagnosis not present

## 2017-08-09 DIAGNOSIS — E7849 Other hyperlipidemia: Secondary | ICD-10-CM | POA: Diagnosis not present

## 2017-08-09 DIAGNOSIS — Z833 Family history of diabetes mellitus: Secondary | ICD-10-CM | POA: Diagnosis not present

## 2017-08-09 DIAGNOSIS — E860 Dehydration: Secondary | ICD-10-CM | POA: Diagnosis not present

## 2017-08-09 DIAGNOSIS — I214 Non-ST elevation (NSTEMI) myocardial infarction: Secondary | ICD-10-CM | POA: Diagnosis not present

## 2017-08-09 DIAGNOSIS — Z888 Allergy status to other drugs, medicaments and biological substances status: Secondary | ICD-10-CM | POA: Diagnosis not present

## 2017-08-09 DIAGNOSIS — R197 Diarrhea, unspecified: Secondary | ICD-10-CM | POA: Diagnosis not present

## 2017-08-09 DIAGNOSIS — R1013 Epigastric pain: Secondary | ICD-10-CM | POA: Diagnosis not present

## 2017-08-09 DIAGNOSIS — E785 Hyperlipidemia, unspecified: Secondary | ICD-10-CM | POA: Diagnosis not present

## 2017-08-09 DIAGNOSIS — Z7902 Long term (current) use of antithrombotics/antiplatelets: Secondary | ICD-10-CM | POA: Diagnosis not present

## 2017-08-10 DIAGNOSIS — I2782 Chronic pulmonary embolism: Secondary | ICD-10-CM | POA: Diagnosis not present

## 2017-08-10 DIAGNOSIS — I10 Essential (primary) hypertension: Secondary | ICD-10-CM | POA: Diagnosis not present

## 2017-08-10 DIAGNOSIS — E119 Type 2 diabetes mellitus without complications: Secondary | ICD-10-CM | POA: Diagnosis not present

## 2017-08-10 DIAGNOSIS — I209 Angina pectoris, unspecified: Secondary | ICD-10-CM | POA: Diagnosis not present

## 2017-08-10 DIAGNOSIS — E7849 Other hyperlipidemia: Secondary | ICD-10-CM | POA: Diagnosis not present

## 2017-08-19 DIAGNOSIS — I201 Angina pectoris with documented spasm: Secondary | ICD-10-CM | POA: Diagnosis not present

## 2017-08-22 DIAGNOSIS — I10 Essential (primary) hypertension: Secondary | ICD-10-CM | POA: Diagnosis not present

## 2017-08-22 DIAGNOSIS — E1121 Type 2 diabetes mellitus with diabetic nephropathy: Secondary | ICD-10-CM | POA: Diagnosis not present

## 2017-08-23 DIAGNOSIS — I4891 Unspecified atrial fibrillation: Secondary | ICD-10-CM

## 2017-08-23 HISTORY — DX: Unspecified atrial fibrillation: I48.91

## 2017-09-04 ENCOUNTER — Encounter: Payer: Self-pay | Admitting: *Deleted

## 2017-09-05 ENCOUNTER — Encounter: Payer: Self-pay | Admitting: *Deleted

## 2017-09-05 ENCOUNTER — Encounter: Payer: Self-pay | Admitting: Cardiovascular Disease

## 2017-09-05 ENCOUNTER — Encounter

## 2017-09-05 ENCOUNTER — Ambulatory Visit (INDEPENDENT_AMBULATORY_CARE_PROVIDER_SITE_OTHER): Payer: Medicare Other | Admitting: Cardiovascular Disease

## 2017-09-05 VITALS — BP 144/78 | HR 98 | Ht 65.0 in | Wt 203.0 lb

## 2017-09-05 DIAGNOSIS — E119 Type 2 diabetes mellitus without complications: Secondary | ICD-10-CM

## 2017-09-05 DIAGNOSIS — Z86711 Personal history of pulmonary embolism: Secondary | ICD-10-CM

## 2017-09-05 DIAGNOSIS — I10 Essential (primary) hypertension: Secondary | ICD-10-CM

## 2017-09-05 DIAGNOSIS — R079 Chest pain, unspecified: Secondary | ICD-10-CM

## 2017-09-05 DIAGNOSIS — R9431 Abnormal electrocardiogram [ECG] [EKG]: Secondary | ICD-10-CM

## 2017-09-05 DIAGNOSIS — R0609 Other forms of dyspnea: Secondary | ICD-10-CM

## 2017-09-05 DIAGNOSIS — R0600 Dyspnea, unspecified: Secondary | ICD-10-CM

## 2017-09-05 DIAGNOSIS — Z8249 Family history of ischemic heart disease and other diseases of the circulatory system: Secondary | ICD-10-CM | POA: Diagnosis not present

## 2017-09-05 DIAGNOSIS — Z136 Encounter for screening for cardiovascular disorders: Secondary | ICD-10-CM | POA: Diagnosis not present

## 2017-09-05 NOTE — Progress Notes (Signed)
CARDIOLOGY CONSULT NOTE  Patient ID: Karen Tate MRN: [id number] DOB/AGE: 06-14-1945 72 y.o.  Admit date: (Not on file) Primary Physician: Neale Burly, MD Referring Physician: Neale Burly, MD  Reason for Consultation: Angina  HPI: Karen Tate is a 72 y.o. female who is being seen today for the evaluation of angina at the request of Hasanaj, Samul Dada, MD.  ECG performed today which I personally reviewed demonstrates sinus rhythm with possible old inferior infarct and T wave inversions inferiorly and V4 through V6.  Past medical history includes hypertension, pulmonary embolism, and type 2 diabetes mellitus.  She was reportedly hospitalized at [facility].  I do not have these records and will have to request them.  She said she thought she had a stomach virus at that time but was told she has problems with her heart.  She was started on aspirin 81 mg daily.  She reportedly has a history of pulmonary embolism dating back to at least a year ago and takes apixaban prescribed by her PCP.  Starting about 2 weeks ago, she complains of "heartburn" which occurs with exertion with accompanying shortness of breath.  She then sits down and falls asleep and by the time she wakes up, symptoms have resolved.  She has felt increasingly fatigued for the past 6 to 12 months.  For this reason her PCP started her on Imdur.  She denies palpitations, orthopnea, leg swelling, and paroxysmal nocturnal dyspnea.  Family history: Several first-degree family members have premature coronary disease.  She has a son who had CABG in his 38s and also has congestive heart failure.  She has another son who developed CHF in his 68s.   Allergies  Allergen Reactions  . Beta Adrenergic Blockers Other (See Comments)    Dropped heart rate to the 40's  . Calcium Channel Blockers Other (See Comments)    Dropped HR into the 40's  . Naproxen Hives    Current Outpatient Medications  Medication Sig  Dispense Refill  . ALPRAZolam (XANAX) 0.5 MG tablet Take 0.5 mg by mouth 2 (two) times daily.    Marland Kitchen amLODipine (NORVASC) 10 MG tablet Take 10 mg by mouth daily.    Marland Kitchen apixaban (ELIQUIS) 5 MG TABS tablet Take 5 mg by mouth 2 (two) times daily.    Marland Kitchen aspirin EC 81 MG tablet Take 81 mg by mouth daily.    . benazepril (LOTENSIN) 40 MG tablet Take 40 mg by mouth daily.    . Black Cohosh 540 MG CAPS Take 1 capsule by mouth daily.    . fish oil-omega-3 fatty acids 1000 MG capsule Take 1 g by mouth 3 (three) times daily.     Marland Kitchen glipiZIDE (GLUCOTROL) 5 MG tablet Take 5 mg by mouth 2 (two) times daily before a meal.    . hydrALAZINE (APRESOLINE) 25 MG tablet Take 25 mg by mouth 3 (three) times daily.     . hydrochlorothiazide (HYDRODIURIL) 25 MG tablet Take 25 mg by mouth daily.    . isosorbide mononitrate (IMDUR) 30 MG 24 hr tablet Take 30 mg by mouth daily.    . meclizine (ANTIVERT) 25 MG tablet Take 25 mg by mouth 3 (three) times daily as needed for dizziness.    . metFORMIN (GLUCOPHAGE) 1000 MG tablet Take 1,000 mg by mouth 2 (two) times daily with a meal.    . omeprazole (PRILOSEC) 40 MG capsule Take 40 mg by mouth daily.    Marland Kitchen  potassium chloride SA (K-DUR,KLOR-CON) 20 MEQ tablet Take 20 mEq by mouth daily.    Marland Kitchen tiZANidine (ZANAFLEX) 4 MG tablet Take 1 tablet (4 mg total) by mouth every 6 (six) hours as needed for muscle spasms. 60 tablet 0   No current facility-administered medications for this visit.     Past Medical History:  Diagnosis Date  . Anxiety neurosis   . Arthritis   . Dizziness   . GERD (gastroesophageal reflux disease)   . Glaucoma   . Hiatal hernia   . Hyperlipemia   . Hypertension   . Joint pain   . Type 2 diabetes mellitus ([place])     Past Surgical History:  Procedure Laterality Date  . HERNIA REPAIR    . SHOULDER SURGERY     LEFT  . TOTAL KNEE ARTHROPLASTY     LEFT  . VAGINAL HYSTERECTOMY      Social History   Socioeconomic History  . Marital status: Married     Spouse name: Not on file  . Number of children: Not on file  . Years of education: Not on file  . Highest education level: Not on file  Occupational History  . Not on file  Social Needs  . Financial resource strain: Not on file  . Food insecurity:    Worry: Not on file    Inability: Not on file  . Transportation needs:    Medical: Not on file    Non-medical: Not on file  Tobacco Use  . Smoking status: Former Smoker    Packs/day: 1.25    Years: 16.00    Pack years: 20.00    Types: Cigarettes    Last attempt to quit: 03/25/1978    Years since quitting: 39.4  . Smokeless tobacco: Never Used  . Tobacco comment: quit more than 30 years ago  Substance and Sexual Activity  . Alcohol use: No  . Drug use: No  . Sexual activity: Not on file  Lifestyle  . Physical activity:    Days per week: Not on file    Minutes per session: Not on file  . Stress: Not on file  Relationships  . Social connections:    Talks on phone: Not on file    Gets together: Not on file    Attends religious service: Not on file    Active member of club or organization: Not on file    Attends meetings of clubs or organizations: Not on file    Relationship status: Not on file  . Intimate partner violence:    Fear of current or ex partner: Not on file    Emotionally abused: Not on file    Physically abused: Not on file    Forced sexual activity: Not on file  Other Topics Concern  . Not on file  Social History Narrative  . Not on file      Current Meds  Medication Sig  . ALPRAZolam (XANAX) 0.5 MG tablet Take 0.5 mg by mouth 2 (two) times daily.  Marland Kitchen amLODipine (NORVASC) 10 MG tablet Take 10 mg by mouth daily.  Marland Kitchen apixaban (ELIQUIS) 5 MG TABS tablet Take 5 mg by mouth 2 (two) times daily.  Marland Kitchen aspirin EC 81 MG tablet Take 81 mg by mouth daily.  . benazepril (LOTENSIN) 40 MG tablet Take 40 mg by mouth daily.  . Black Cohosh 540 MG CAPS Take 1 capsule by mouth daily.  . fish oil-omega-3 fatty acids 1000 MG  capsule Take 1 g by  mouth 3 (three) times daily.   Marland Kitchen glipiZIDE (GLUCOTROL) 5 MG tablet Take 5 mg by mouth 2 (two) times daily before a meal.  . hydrALAZINE (APRESOLINE) 25 MG tablet Take 25 mg by mouth 3 (three) times daily.   . hydrochlorothiazide (HYDRODIURIL) 25 MG tablet Take 25 mg by mouth daily.  . isosorbide mononitrate (IMDUR) 30 MG 24 hr tablet Take 30 mg by mouth daily.  . meclizine (ANTIVERT) 25 MG tablet Take 25 mg by mouth 3 (three) times daily as needed for dizziness.  . metFORMIN (GLUCOPHAGE) 1000 MG tablet Take 1,000 mg by mouth 2 (two) times daily with a meal.  . omeprazole (PRILOSEC) 40 MG capsule Take 40 mg by mouth daily.  . potassium chloride SA (K-DUR,KLOR-CON) 20 MEQ tablet Take 20 mEq by mouth daily.  Marland Kitchen tiZANidine (ZANAFLEX) 4 MG tablet Take 1 tablet (4 mg total) by mouth every 6 (six) hours as needed for muscle spasms.      Review of systems complete and found to be negative unless listed above in HPI    Physical exam Blood pressure (!) 144/78, pulse 98, height 5\' 5"  (1.651 m), weight 203 lb (92.1 kg), SpO2 98 %. General: NAD Neck: No JVD, no thyromegaly or thyroid nodule.  Lungs: Clear to auscultation bilaterally with normal respiratory effort. CV: Nondisplaced PMI. Regular rate and rhythm, normal S1/S2, no S3/S4, no murmur.  No peripheral edema.  No carotid bruit.   Abdomen: Soft, nontender, no distention.  Skin: Intact without lesions or rashes.  Neurologic: Alert and oriented x 3.  Psych: Normal affect. Extremities: No clubbing or cyanosis.  HEENT: Normal.   ECG: Most recent ECG reviewed.   Labs: Lab Results  Component Value Date/Time   K 3.9 09/20/2014 09:49 AM   BUN 15 09/20/2014 09:49 AM   CREATININE 1.09 (H) 09/20/2014 09:49 AM   HGB 8.0 (L) 09/30/2014 09:35 AM     Lipids: No results found for: LDLCALC, LDLDIRECT, CHOL, TRIG, HDL      ASSESSMENT AND PLAN:  1.  Chest pain, exertional dyspnea, and abnormal ECG: Given her cardiovascular  risk factors and strong family history with grossly abnormal ECG, I am concerned that she has ischemic heart disease.  I talked to both the patient and her family members about stress testing and cardiac catheterization. I will proceed with a nuclear myocardial perfusion imaging study to evaluate for ischemic heart disease (Lexiscan Myoview). I will order a 2-D echocardiogram with Doppler to evaluate cardiac structure, function, and regional wall motion. She is currently on aspirin 81 mg and Imdur 30 mg daily.  She should also be on statin therapy given her history of type 2 diabetes mellitus. I will obtain a copy of all hospitalization records from [facility] including blood tests and ECGs.  2.  Hypertension: Blood pressure is mildly elevated.  This will need continued monitoring.  She is on amlodipine, benazepril, hydrochlorothiazide, and hydralazine.  3.  History of pulmonary embolism: Currently on Eliquis 5 mill grams twice daily.  4.  Type 2 diabetes mellitus: Currently on glipizide and metformin.   Disposition: Follow up in 6 weeks  Signed: Kate Sable, M.D., F.A.C.C.  09/05/2017, 10:20 AM

## 2017-09-05 NOTE — Patient Instructions (Signed)
Medication Instructions:  Your physician recommends that you continue on your current medications as directed. Please refer to the Current Medication list given to you today.   Labwork: NONE   Testing/Procedures: Your physician has requested that you have a lexiscan myoview. For further information please visit [email or web address]. Please follow instruction sheet, as given.  Your physician has requested that you have an echocardiogram. Echocardiography is a painless test that uses sound waves to create images of your heart. It provides your doctor with information about the size and shape of your heart and how well your heart's chambers and valves are working. This procedure takes approximately one hour. There are no restrictions for this procedure.    Follow-Up: Your physician recommends that you schedule a follow-up appointment in: 6 Weeks    Any Other Special Instructions Will Be Listed Below (If Applicable).     If you need a refill on your cardiac medications before your next appointment, please call your pharmacy. Thank you for choosing [facility]!

## 2017-09-12 ENCOUNTER — Inpatient Hospital Stay (HOSPITAL_COMMUNITY)
Admission: EM | Admit: 2017-09-12 | Discharge: 2017-09-25 | DRG: 234 | Disposition: A | Discharge status: Home / Self Care | Payer: Medicare Other | Source: Ambulatory Visit | Attending: Thoracic Surgery (Cardiothoracic Vascular Surgery) | Admitting: Thoracic Surgery (Cardiothoracic Vascular Surgery)

## 2017-09-12 ENCOUNTER — Other Ambulatory Visit: Payer: Self-pay

## 2017-09-12 ENCOUNTER — Encounter (HOSPITAL_COMMUNITY): Payer: Self-pay

## 2017-09-12 ENCOUNTER — Encounter (HOSPITAL_COMMUNITY)
Admission: RE | Admit: 2017-09-12 | Discharge: 2017-09-12 | Disposition: A | Discharge status: Home / Self Care | Payer: Medicare Other | Source: Ambulatory Visit | Attending: Cardiovascular Disease | Admitting: Cardiovascular Disease

## 2017-09-12 ENCOUNTER — Emergency Department (HOSPITAL_COMMUNITY): Payer: Medicare Other

## 2017-09-12 ENCOUNTER — Encounter (HOSPITAL_COMMUNITY): Payer: Self-pay | Admitting: Emergency Medicine

## 2017-09-12 ENCOUNTER — Ambulatory Visit (HOSPITAL_COMMUNITY)
Admission: RE | Admit: 2017-09-12 | Discharge: 2017-09-12 | Disposition: A | Discharge status: Home / Self Care | Payer: Medicare Other | Source: Ambulatory Visit | Attending: Cardiovascular Disease | Admitting: Cardiovascular Disease

## 2017-09-12 DIAGNOSIS — Z4682 Encounter for fitting and adjustment of non-vascular catheter: Secondary | ICD-10-CM | POA: Diagnosis not present

## 2017-09-12 DIAGNOSIS — R079 Chest pain, unspecified: Secondary | ICD-10-CM

## 2017-09-12 DIAGNOSIS — Z9689 Presence of other specified functional implants: Secondary | ICD-10-CM

## 2017-09-12 DIAGNOSIS — Z7984 Long term (current) use of oral hypoglycemic drugs: Secondary | ICD-10-CM | POA: Insufficient documentation

## 2017-09-12 DIAGNOSIS — K219 Gastro-esophageal reflux disease without esophagitis: Secondary | ICD-10-CM | POA: Diagnosis not present

## 2017-09-12 DIAGNOSIS — Z86711 Personal history of pulmonary embolism: Secondary | ICD-10-CM | POA: Insufficient documentation

## 2017-09-12 DIAGNOSIS — Z96652 Presence of left artificial knee joint: Secondary | ICD-10-CM | POA: Diagnosis not present

## 2017-09-12 DIAGNOSIS — H409 Unspecified glaucoma: Secondary | ICD-10-CM | POA: Insufficient documentation

## 2017-09-12 DIAGNOSIS — R0600 Dyspnea, unspecified: Secondary | ICD-10-CM

## 2017-09-12 DIAGNOSIS — Z90710 Acquired absence of both cervix and uterus: Secondary | ICD-10-CM | POA: Insufficient documentation

## 2017-09-12 DIAGNOSIS — Z886 Allergy status to analgesic agent status: Secondary | ICD-10-CM

## 2017-09-12 DIAGNOSIS — R9431 Abnormal electrocardiogram [ECG] [EKG]: Secondary | ICD-10-CM | POA: Insufficient documentation

## 2017-09-12 DIAGNOSIS — E785 Hyperlipidemia, unspecified: Secondary | ICD-10-CM | POA: Diagnosis not present

## 2017-09-12 DIAGNOSIS — Z87891 Personal history of nicotine dependence: Secondary | ICD-10-CM

## 2017-09-12 DIAGNOSIS — Z79899 Other long term (current) drug therapy: Secondary | ICD-10-CM | POA: Insufficient documentation

## 2017-09-12 DIAGNOSIS — Z951 Presence of aortocoronary bypass graft: Secondary | ICD-10-CM | POA: Diagnosis not present

## 2017-09-12 DIAGNOSIS — I4891 Unspecified atrial fibrillation: Secondary | ICD-10-CM | POA: Diagnosis not present

## 2017-09-12 DIAGNOSIS — E1121 Type 2 diabetes mellitus with diabetic nephropathy: Secondary | ICD-10-CM | POA: Diagnosis not present

## 2017-09-12 DIAGNOSIS — R000 Tachycardia, unspecified: Secondary | ICD-10-CM | POA: Diagnosis not present

## 2017-09-12 DIAGNOSIS — Z7901 Long term (current) use of anticoagulants: Secondary | ICD-10-CM

## 2017-09-12 DIAGNOSIS — E8770 Fluid overload, unspecified: Secondary | ICD-10-CM | POA: Diagnosis not present

## 2017-09-12 DIAGNOSIS — J9383 Other pneumothorax: Secondary | ICD-10-CM | POA: Diagnosis not present

## 2017-09-12 DIAGNOSIS — Z8249 Family history of ischemic heart disease and other diseases of the circulatory system: Secondary | ICD-10-CM | POA: Diagnosis not present

## 2017-09-12 DIAGNOSIS — I25110 Atherosclerotic heart disease of native coronary artery with unstable angina pectoris: Secondary | ICD-10-CM | POA: Diagnosis present

## 2017-09-12 DIAGNOSIS — J90 Pleural effusion, not elsewhere classified: Secondary | ICD-10-CM | POA: Diagnosis not present

## 2017-09-12 DIAGNOSIS — Z01818 Encounter for other preprocedural examination: Secondary | ICD-10-CM

## 2017-09-12 DIAGNOSIS — I209 Angina pectoris, unspecified: Secondary | ICD-10-CM | POA: Insufficient documentation

## 2017-09-12 DIAGNOSIS — I2510 Atherosclerotic heart disease of native coronary artery without angina pectoris: Secondary | ICD-10-CM

## 2017-09-12 DIAGNOSIS — D649 Anemia, unspecified: Secondary | ICD-10-CM | POA: Diagnosis present

## 2017-09-12 DIAGNOSIS — Z6834 Body mass index (BMI) 34.0-34.9, adult: Secondary | ICD-10-CM | POA: Diagnosis not present

## 2017-09-12 DIAGNOSIS — J810 Acute pulmonary edema: Secondary | ICD-10-CM | POA: Diagnosis not present

## 2017-09-12 DIAGNOSIS — R0609 Other forms of dyspnea: Secondary | ICD-10-CM

## 2017-09-12 DIAGNOSIS — F419 Anxiety disorder, unspecified: Secondary | ICD-10-CM | POA: Insufficient documentation

## 2017-09-12 DIAGNOSIS — R9439 Abnormal result of other cardiovascular function study: Secondary | ICD-10-CM | POA: Insufficient documentation

## 2017-09-12 DIAGNOSIS — I081 Rheumatic disorders of both mitral and tricuspid valves: Secondary | ICD-10-CM | POA: Diagnosis not present

## 2017-09-12 DIAGNOSIS — F411 Generalized anxiety disorder: Secondary | ICD-10-CM | POA: Diagnosis present

## 2017-09-12 DIAGNOSIS — R918 Other nonspecific abnormal finding of lung field: Secondary | ICD-10-CM | POA: Diagnosis not present

## 2017-09-12 DIAGNOSIS — I200 Unstable angina: Secondary | ICD-10-CM | POA: Diagnosis not present

## 2017-09-12 DIAGNOSIS — I10 Essential (primary) hypertension: Secondary | ICD-10-CM | POA: Diagnosis not present

## 2017-09-12 DIAGNOSIS — Z7982 Long term (current) use of aspirin: Secondary | ICD-10-CM

## 2017-09-12 DIAGNOSIS — I214 Non-ST elevation (NSTEMI) myocardial infarction: Secondary | ICD-10-CM | POA: Diagnosis not present

## 2017-09-12 DIAGNOSIS — R0789 Other chest pain: Secondary | ICD-10-CM | POA: Diagnosis not present

## 2017-09-12 DIAGNOSIS — E669 Obesity, unspecified: Secondary | ICD-10-CM | POA: Diagnosis present

## 2017-09-12 DIAGNOSIS — E119 Type 2 diabetes mellitus without complications: Secondary | ICD-10-CM | POA: Insufficient documentation

## 2017-09-12 DIAGNOSIS — Z452 Encounter for adjustment and management of vascular access device: Secondary | ICD-10-CM | POA: Diagnosis not present

## 2017-09-12 DIAGNOSIS — Z09 Encounter for follow-up examination after completed treatment for conditions other than malignant neoplasm: Secondary | ICD-10-CM

## 2017-09-12 DIAGNOSIS — I2699 Other pulmonary embolism without acute cor pulmonale: Secondary | ICD-10-CM

## 2017-09-12 DIAGNOSIS — Z888 Allergy status to other drugs, medicaments and biological substances status: Secondary | ICD-10-CM | POA: Insufficient documentation

## 2017-09-12 DIAGNOSIS — M1990 Unspecified osteoarthritis, unspecified site: Secondary | ICD-10-CM | POA: Diagnosis present

## 2017-09-12 DIAGNOSIS — I25810 Atherosclerosis of coronary artery bypass graft(s) without angina pectoris: Secondary | ICD-10-CM | POA: Diagnosis not present

## 2017-09-12 DIAGNOSIS — Z01810 Encounter for preprocedural cardiovascular examination: Secondary | ICD-10-CM | POA: Diagnosis not present

## 2017-09-12 HISTORY — DX: Other pulmonary embolism without acute cor pulmonale: I26.99

## 2017-09-12 LAB — I-STAT TROPONIN, ED: Troponin i, poc: 0.03 ng/mL (ref 0.00–0.08)

## 2017-09-12 LAB — BASIC METABOLIC PANEL
Anion gap: 11 (ref 5–15)
BUN: 14 mg/dL (ref 6–20)
CO2: 25 mmol/L (ref 22–32)
Calcium: 9.5 mg/dL (ref 8.9–10.3)
Chloride: 103 mmol/L (ref 101–111)
Creatinine, Ser: 1.15 mg/dL — ABNORMAL HIGH (ref 0.44–1.00)
GFR calc Af Amer: 53 mL/min — ABNORMAL LOW (ref >60)
GFR calc non Af Amer: 46 mL/min — ABNORMAL LOW (ref >60)
Glucose, Bld: 184 mg/dL — ABNORMAL HIGH (ref 65–99)
Potassium: 3.9 mmol/L (ref 3.5–5.1)
Sodium: 139 mmol/L (ref 135–145)

## 2017-09-12 LAB — NM MYOCAR MULTI W/SPECT W/WALL MOTION / EF
Peak HR: 126 {beats}/min
Rest HR: 97 {beats}/min

## 2017-09-12 LAB — HEPARIN LEVEL (UNFRACTIONATED)
Heparin Unfractionated: 0.59 IU/mL (ref 0.30–0.70)
Heparin Unfractionated: 0.42 IU/mL (ref 0.30–0.70)

## 2017-09-12 LAB — TROPONIN I
Troponin I: 0.7 ng/mL (ref <0.03)
Troponin I: 4.33 ng/mL (ref <0.03)

## 2017-09-12 LAB — CBC
HCT: 33.5 % — ABNORMAL LOW (ref 36.0–46.0)
Hemoglobin: 10.4 g/dL — ABNORMAL LOW (ref 12.0–15.0)
MCH: 29.1 pg (ref 26.0–34.0)
MCHC: 31 g/dL (ref 30.0–36.0)
MCV: 93.8 fL (ref 78.0–100.0)
Platelets: 412 10*3/uL — ABNORMAL HIGH (ref 150–400)
RBC: 3.57 MIL/uL — ABNORMAL LOW (ref 3.87–5.11)
RDW: 15.1 % (ref 11.5–15.5)
WBC: 7 10*3/uL (ref 4.0–10.5)

## 2017-09-12 LAB — APTT
aPTT: 35 seconds (ref 24–36)
aPTT: 47 seconds — ABNORMAL HIGH (ref 24–36)

## 2017-09-12 LAB — TSH: TSH: 1.064 u[IU]/mL (ref 0.350–4.500)

## 2017-09-12 LAB — HEMOGLOBIN A1C
Hgb A1c MFr Bld: 7.4 % — ABNORMAL HIGH (ref 4.8–5.6)
Mean Plasma Glucose: 165.68 mg/dL

## 2017-09-12 MED ORDER — ONDANSETRON HCL 4 MG/2ML IJ SOLN: 4.0000 mg | Freq: Four times a day (QID) | INTRAMUSCULAR | Status: DC | PRN

## 2017-09-12 MED ORDER — NITROGLYCERIN 0.4 MG SL SUBL
SUBLINGUAL_TABLET | SUBLINGUAL | Status: AC
  Administered 2017-09-12: 0.4 mg via ORAL
  Filled 2017-09-12: qty 1

## 2017-09-12 MED ORDER — MECLIZINE HCL 25 MG PO TABS: 25.0000 mg | ORAL_TABLET | Freq: Three times a day (TID) | ORAL | Status: DC | PRN

## 2017-09-12 MED ORDER — NITROGLYCERIN 0.4 MG SL SUBL: 0.4000 mg | SUBLINGUAL_TABLET | SUBLINGUAL | Status: DC | PRN

## 2017-09-12 MED ORDER — TECHNETIUM TC 99M TETROFOSMIN IV KIT: 10.0000 | PACK | Freq: Once | INTRAVENOUS | Status: DC | PRN

## 2017-09-12 MED ORDER — POTASSIUM CHLORIDE CRYS ER 20 MEQ PO TBCR
20.0000 meq | EXTENDED_RELEASE_TABLET | Freq: Every day | ORAL | Status: DC
  Administered 2017-09-13 – 2017-09-17 (×5): 20 meq via ORAL
  Filled 2017-09-12 (×6): qty 1

## 2017-09-12 MED ORDER — REGADENOSON 0.4 MG/5ML IV SOLN
INTRAVENOUS | Status: AC
  Administered 2017-09-12: 0.4 mg via INTRAVENOUS
  Filled 2017-09-12: qty 5

## 2017-09-12 MED ORDER — ALPRAZOLAM 0.5 MG PO TABS
0.5000 mg | ORAL_TABLET | Freq: Two times a day (BID) | ORAL | Status: DC
  Administered 2017-09-12 – 2017-09-21 (×17): 0.5 mg via ORAL
  Filled 2017-09-12 (×17): qty 1

## 2017-09-12 MED ORDER — AMLODIPINE BESYLATE 10 MG PO TABS
10.0000 mg | ORAL_TABLET | Freq: Every day | ORAL | Status: DC
  Administered 2017-09-12 – 2017-09-17 (×6): 10 mg via ORAL
  Filled 2017-09-12 (×6): qty 1

## 2017-09-12 MED ORDER — ASPIRIN 81 MG PO CHEW: 324.0000 mg | CHEWABLE_TABLET | Freq: Once | ORAL | Status: DC

## 2017-09-12 MED ORDER — SODIUM CHLORIDE 0.9% FLUSH
INTRAVENOUS | Status: AC
  Administered 2017-09-12: 10 mL via INTRAVENOUS
  Filled 2017-09-12: qty 10

## 2017-09-12 MED ORDER — ASPIRIN 81 MG PO CHEW
CHEWABLE_TABLET | ORAL | Status: AC
  Administered 2017-09-12: 324 mg via ORAL
  Filled 2017-09-12: qty 4

## 2017-09-12 MED ORDER — SODIUM CHLORIDE 0.9% FLUSH: 3.0000 mL | INTRAVENOUS | Status: DC | PRN

## 2017-09-12 MED ORDER — HYDRALAZINE HCL 25 MG PO TABS
25.0000 mg | ORAL_TABLET | Freq: Three times a day (TID) | ORAL | Status: DC
  Administered 2017-09-12 – 2017-09-17 (×17): 25 mg via ORAL
  Filled 2017-09-12 (×17): qty 1

## 2017-09-12 MED ORDER — SODIUM CHLORIDE 0.9 % WEIGHT BASED INFUSION: 3.0000 mL/kg/h | INTRAVENOUS | Status: AC

## 2017-09-12 MED ORDER — ZOLPIDEM TARTRATE 5 MG PO TABS: 5.0000 mg | ORAL_TABLET | Freq: Every evening | ORAL | Status: DC | PRN

## 2017-09-12 MED ORDER — ISOSORBIDE MONONITRATE ER 30 MG PO TB24
30.0000 mg | ORAL_TABLET | Freq: Every day | ORAL | Status: DC
  Administered 2017-09-12 – 2017-09-17 (×6): 30 mg via ORAL
  Filled 2017-09-12 (×6): qty 1

## 2017-09-12 MED ORDER — BENAZEPRIL HCL 10 MG PO TABS
40.0000 mg | ORAL_TABLET | Freq: Every day | ORAL | Status: DC
  Administered 2017-09-12 – 2017-09-17 (×6): 40 mg via ORAL
  Filled 2017-09-12 (×6): qty 4

## 2017-09-12 MED ORDER — SODIUM CHLORIDE 0.9 % WEIGHT BASED INFUSION: 1.0000 mL/kg/h | INTRAVENOUS | Status: DC

## 2017-09-12 MED ORDER — SODIUM CHLORIDE 0.9% FLUSH
3.0000 mL | Freq: Two times a day (BID) | INTRAVENOUS | Status: DC
  Administered 2017-09-12 – 2017-09-14 (×5): 3 mL via INTRAVENOUS

## 2017-09-12 MED ORDER — PANTOPRAZOLE SODIUM 40 MG PO TBEC
40.0000 mg | DELAYED_RELEASE_TABLET | Freq: Every day | ORAL | Status: DC
  Administered 2017-09-12 – 2017-09-17 (×6): 40 mg via ORAL
  Filled 2017-09-12 (×6): qty 1

## 2017-09-12 MED ORDER — ALPRAZOLAM 0.25 MG PO TABS
0.2500 mg | ORAL_TABLET | Freq: Two times a day (BID) | ORAL | Status: DC | PRN
  Filled 2017-09-12: qty 1

## 2017-09-12 MED ORDER — HEPARIN (PORCINE) IN NACL 100-0.45 UNIT/ML-% IJ SOLN
1150.0000 [IU]/h | INTRAMUSCULAR | Status: DC
  Administered 2017-09-12: 950 [IU]/h via INTRAVENOUS
  Administered 2017-09-13 – 2017-09-14 (×2): 1150 [IU]/h via INTRAVENOUS
  Filled 2017-09-12 (×3): qty 250

## 2017-09-12 MED ORDER — TIZANIDINE HCL 4 MG PO TABS
4.0000 mg | ORAL_TABLET | Freq: Four times a day (QID) | ORAL | Status: DC | PRN
  Administered 2017-09-13: 4 mg via ORAL
  Filled 2017-09-12 (×3): qty 1

## 2017-09-12 MED ORDER — OMEGA-3 FATTY ACIDS 1000 MG PO CAPS
1.0000 g | ORAL_CAPSULE | Freq: Three times a day (TID) | ORAL | Status: DC
  Administered 2017-09-12: 1 g via ORAL
  Filled 2017-09-12 (×4): qty 1

## 2017-09-12 MED ORDER — ACETAMINOPHEN 325 MG PO TABS: 650.0000 mg | ORAL_TABLET | ORAL | Status: DC | PRN

## 2017-09-12 MED ORDER — LORAZEPAM 2 MG/ML IJ SOLN
0.5000 mg | Freq: Once | INTRAMUSCULAR | Status: AC
  Administered 2017-09-12: 0.5 mg via INTRAVENOUS
  Filled 2017-09-12: qty 1

## 2017-09-12 MED ORDER — OMEGA-3-ACID ETHYL ESTERS 1 G PO CAPS
1.0000 g | ORAL_CAPSULE | Freq: Three times a day (TID) | ORAL | Status: DC
  Administered 2017-09-12 – 2017-09-25 (×35): 1 g via ORAL
  Filled 2017-09-12 (×35): qty 1

## 2017-09-12 MED ORDER — INSULIN ASPART 100 UNIT/ML ~~LOC~~ SOLN
0.0000 [IU] | Freq: Three times a day (TID) | SUBCUTANEOUS | Status: DC
  Administered 2017-09-13: 2 [IU] via SUBCUTANEOUS
  Administered 2017-09-13 – 2017-09-14 (×5): 3 [IU] via SUBCUTANEOUS
  Administered 2017-09-15: 2 [IU] via SUBCUTANEOUS
  Administered 2017-09-15: 3 [IU] via SUBCUTANEOUS
  Administered 2017-09-16: 6 [IU] via SUBCUTANEOUS
  Administered 2017-09-16: 2 [IU] via SUBCUTANEOUS
  Administered 2017-09-16 – 2017-09-17 (×3): 3 [IU] via SUBCUTANEOUS

## 2017-09-12 MED ORDER — SODIUM CHLORIDE 0.9% FLUSH
3.0000 mL | Freq: Two times a day (BID) | INTRAVENOUS | Status: DC
  Administered 2017-09-12 – 2017-09-17 (×10): 3 mL via INTRAVENOUS

## 2017-09-12 MED ORDER — GLIPIZIDE 5 MG PO TABS
5.0000 mg | ORAL_TABLET | Freq: Two times a day (BID) | ORAL | Status: DC
  Administered 2017-09-12 – 2017-09-17 (×11): 5 mg via ORAL
  Filled 2017-09-12 (×11): qty 1

## 2017-09-12 MED ORDER — TECHNETIUM TC 99M TETROFOSMIN IV KIT: 30.0000 | PACK | Freq: Once | INTRAVENOUS | Status: DC | PRN

## 2017-09-12 MED ORDER — SODIUM CHLORIDE 0.9 % IV SOLN: 250.0000 mL | INTRAVENOUS | Status: DC | PRN

## 2017-09-12 MED ORDER — ASPIRIN EC 81 MG PO TBEC
81.0000 mg | DELAYED_RELEASE_TABLET | Freq: Every day | ORAL | Status: DC
  Administered 2017-09-13 – 2017-09-17 (×4): 81 mg via ORAL
  Filled 2017-09-12 (×6): qty 1

## 2017-09-12 MED ORDER — INSULIN ASPART 100 UNIT/ML ~~LOC~~ SOLN: 0.0000 [IU] | Freq: Every day | SUBCUTANEOUS | Status: DC

## 2017-09-12 NOTE — H&P (Addendum)
Signed                CARDIOLOGY HISTORY AND PHYSICAL  Patient ID: ADDASYN MCBREEN MRN: [id number] DOB/AGE: 08-15-1945 72 y.o.  Admit date: 09/12/2017 Primary Physician: Neale Burly, MD  Reason for Consultation: Angina  HPI: Karen Tate is a 72 y.o. female who was seen 06/14 for the evaluation of angina at the request of Neale Burly, MD.  ECG performed 06/14, Dr Bronson Ing reviewed, demonstrated sinus rhythm with possible old inferior infarct and T wave inversions inferiorly and V4 through V6.  Past medical history includes hypertension, pulmonary embolism, and type 2 diabetes mellitus.  She was reportedly hospitalized at [facility].  Records are not available.  She said she thought she had a stomach virus at that time but was told she has problems with her heart.  She was started on aspirin 81 mg daily.  She reportedly has a history of pulmonary embolism dating back to at least a year ago and takes apixaban prescribed by her PCP.  Starting about 2 weeks ago, she complains of "heartburn" which occurs with exertion with accompanying shortness of breath.  She then sits down and falls asleep and by the time she wakes up, symptoms have resolved.  She has felt increasingly fatigued for the past 6 to 12 months.  For this reason her PCP started her on Imdur.  She denies palpitations, orthopnea, leg swelling, and paroxysmal nocturnal dyspnea.   She came to the hospital for a [place] on 09/12/2017. During the test, she developed SSCP, burning, at 5/10. She was given ASA 81 mg x 4 and SL Nitro x 1. Her CP resolved.  ECGs were reviewed by Dr Bronson Ing and were abnormal, with ST depression in anterolateral leads.    Family history: Several first-degree family members have premature coronary disease.  She has a son who had CABG in his 65s and also has congestive heart failure.  She has another son who developed CHF in his 50s.        Allergies    Allergen Reactions  . Beta Adrenergic Blockers Other (See Comments)    Dropped heart rate to the 40's  . Calcium Channel Blockers Other (See Comments)    Dropped HR into the 40's  . Naproxen Hives          Current Outpatient Medications  Medication Sig Dispense Refill  . ALPRAZolam (XANAX) 0.5 MG tablet Take 0.5 mg by mouth 2 (two) times daily.    Marland Kitchen amLODipine (NORVASC) 10 MG tablet Take 10 mg by mouth daily.    Marland Kitchen apixaban (ELIQUIS) 5 MG TABS tablet Take 5 mg by mouth 2 (two) times daily.    Marland Kitchen aspirin EC 81 MG tablet Take 81 mg by mouth daily.    . benazepril (LOTENSIN) 40 MG tablet Take 40 mg by mouth daily.    . Black Cohosh 540 MG CAPS Take 1 capsule by mouth daily.    . fish oil-omega-3 fatty acids 1000 MG capsule Take 1 g by mouth 3 (three) times daily.     Marland Kitchen glipiZIDE (GLUCOTROL) 5 MG tablet Take 5 mg by mouth 2 (two) times daily before a meal.    . hydrALAZINE (APRESOLINE) 25 MG tablet Take 25 mg by mouth 3 (three) times daily.     . hydrochlorothiazide (HYDRODIURIL) 25 MG tablet Take 25 mg by mouth daily.    . isosorbide mononitrate (IMDUR) 30 MG 24 hr tablet Take 30 mg by mouth  daily.    . meclizine (ANTIVERT) 25 MG tablet Take 25 mg by mouth 3 (three) times daily as needed for dizziness.    . metFORMIN (GLUCOPHAGE) 1000 MG tablet Take 1,000 mg by mouth 2 (two) times daily with a meal.    . omeprazole (PRILOSEC) 40 MG capsule Take 40 mg by mouth daily.    . potassium chloride SA (K-DUR,KLOR-CON) 20 MEQ tablet Take 20 mEq by mouth daily.    Marland Kitchen tiZANidine (ZANAFLEX) 4 MG tablet Take 1 tablet (4 mg total) by mouth every 6 (six) hours as needed for muscle spasms. 60 tablet 0   No current facility-administered medications for this visit.         Past Medical History:  Diagnosis Date  . Anxiety neurosis   . Arthritis   . Dizziness   . GERD (gastroesophageal reflux disease)   . Glaucoma   . Hiatal hernia   . Hyperlipemia   .  Hypertension   . Joint pain   . Type 2 diabetes mellitus ([place])          Past Surgical History:  Procedure Laterality Date  . HERNIA REPAIR    . SHOULDER SURGERY     LEFT  . TOTAL KNEE ARTHROPLASTY     LEFT  . VAGINAL HYSTERECTOMY      Social History        Socioeconomic History  . Marital status: Married    Spouse name: Not on file  . Number of children: Not on file  . Years of education: Not on file  . Highest education level: Not on file  Occupational History  . Retired  Scientific laboratory technician  . Financial resource strain: Not on file  . Food insecurity:    Worry: Not on file    Inability: Not on file  . Transportation needs:    Medical: Not on file    Non-medical: Not on file  Tobacco Use  . Smoking status: Former Smoker    Packs/day: 1.25    Years: 16.00    Pack years: 20.00    Types: Cigarettes    Last attempt to quit: 03/25/1978    Years since quitting: 39.4  . Smokeless tobacco: Never Used  . Tobacco comment: quit more than 30 years ago  Substance and Sexual Activity  . Alcohol use: No  . Drug use: No  . Sexual activity: Not on file  Lifestyle  . Physical activity:    Days per week: Not on file    Minutes per session: Not on file  . Stress: Not on file  Relationships  . Social connections:    Talks on phone: Not on file    Gets together: Not on file    Attends religious service: Not on file    Active member of club or organization: Not on file    Attends meetings of clubs or organizations: Not on file    Relationship status: Not on file  . Intimate partner violence:    Fear of current or ex partner: Not on file    Emotionally abused: Not on file    Physically abused: Not on file    Forced sexual activity: Not on file  Other Topics Concern  . Not on file  Social History Narrative  . Lives with family      ActiveMedications      Current Meds  Medication Sig  . ALPRAZolam  (XANAX) 0.5 MG tablet Take 0.5 mg by mouth 2 (two) times daily.  Marland Kitchen  amLODipine (NORVASC) 10 MG tablet Take 10 mg by mouth daily.  Marland Kitchen apixaban (ELIQUIS) 5 MG TABS tablet Take 5 mg by mouth 2 (two) times daily.  Marland Kitchen aspirin EC 81 MG tablet Take 81 mg by mouth daily.  . benazepril (LOTENSIN) 40 MG tablet Take 40 mg by mouth daily.  . Black Cohosh 540 MG CAPS Take 1 capsule by mouth daily.  . fish oil-omega-3 fatty acids 1000 MG capsule Take 1 g by mouth 3 (three) times daily.   Marland Kitchen glipiZIDE (GLUCOTROL) 5 MG tablet Take 5 mg by mouth 2 (two) times daily before a meal.  . hydrALAZINE (APRESOLINE) 25 MG tablet Take 25 mg by mouth 3 (three) times daily.   . hydrochlorothiazide (HYDRODIURIL) 25 MG tablet Take 25 mg by mouth daily.  . isosorbide mononitrate (IMDUR) 30 MG 24 hr tablet Take 30 mg by mouth daily.  . meclizine (ANTIVERT) 25 MG tablet Take 25 mg by mouth 3 (three) times daily as needed for dizziness.  . metFORMIN (GLUCOPHAGE) 1000 MG tablet Take 1,000 mg by mouth 2 (two) times daily with a meal.  . omeprazole (PRILOSEC) 40 MG capsule Take 40 mg by mouth daily.  . potassium chloride SA (K-DUR,KLOR-CON) 20 MEQ tablet Take 20 mEq by mouth daily.  Marland Kitchen tiZANidine (ZANAFLEX) 4 MG tablet Take 1 tablet (4 mg total) by mouth every 6 (six) hours as needed for muscle spasms.        Review of systems complete and found to be negative unless listed above in HPI    Physical exam Blood pressure (!) 144/78, pulse 98, height 5\' 5"  (1.651 m), weight 203 lb (92.1 kg), SpO2 98 %. General: NAD Neck: No JVD, no thyromegaly or thyroid nodule.  Lungs: Clear to auscultation bilaterally with normal respiratory effort. CV: Nondisplaced PMI. Regular rate and rhythm, normal S1/S2, no S3/S4, no murmur.  No peripheral edema.  No carotid bruit.   Abdomen: Soft, nontender, no distention.  Skin: Intact without lesions or rashes.  Neurologic: Alert and oriented x 3.  Psych: Normal affect. Extremities: No clubbing or  cyanosis.  HEENT: Normal.   ECG: Most recent ECG reviewed. 06/21, SR, repol abnl, severe global ischemia   Labs: pending    ASSESSMENT AND PLAN:  1.  Chest pain, exertional dyspnea, and abnormal ECG:  - ECG became significantly abnl after Lexiscan. - Dr Bronson Ing in to see - pt given ASA, SL NTG and sx resolved. - pt tx to ER and tx arranged to [facility] for cath - The risks and benefits of a cardiac catheterization including, but not limited to, death, stroke, MI, kidney damage and bleeding were discussed with the patient by Dr Bronson Ing, who indicates understanding and agrees to proceed.   2.  Hypertension: Blood pressure is elevated.   - Continue amlodipine, benazepril, hydrochlorothiazide, and hydralazine.  3.  History of pulmonary embolism: Currently on Eliquis 5 mill grams twice daily. **she has not had this today**  4.  Type 2 diabetes mellitus: Currently on glipizide and metformin. Hold Metformin.   Disposition: Follow up in 6 weeks  Signed: Rosaria Ferries, PA-C 09/12/2017 2:19 PM Beeper [phone]  The patient was seen and examined, and I agree with the history, physical exam, assessment and plan as documented above, with modifications as noted below.  Briefly, this is a 72 year old woman whom I evaluated for the first time in the office on 09/05/2017.  Past medical history includes hypertension, pulmonary embolism, and type 2 diabetes mellitus.  She has been  having episodes of "heartburn "which occurred primarily with exertion accompanied by shortness of breath.  She has several first-degree family members who have premature coronary disease.  I then arrange for a Lexiscan Myoview stress test and echocardiogram.  I was then called by Rosaria Ferries, PA-C to the stress lab today to evaluate the patient during her stress test.  Baseline ECG demonstrated sinus rhythm with nonspecific ST segment and T wave abnormalities in leads III, aVF, and V4 through V6.  With  Lexiscan infusion, she began to develop significant horizontal ST segment depressions in leads V3 through V6 and up to 2 mm in leads V4 and 1 mm in V5 and V6.  She also complained of "heartburn".  These abnormalities persisted into recovery.  There were also nonspecific ST segment and T wave abnormalities in leads V2 and V3.  Given her overall presentation, I am concerned about the high probability of ischemic heart disease.  She was administered 4 baby aspirin and 1 sublingual nitroglycerin.  I spoke to her about transfer to [facility] today for coronary angiography and she is in agreement.  Risks and benefits of cardiac catheterization have been discussed with the patient.  These include bleeding, infection, kidney damage, stroke, heart attack, death.  The patient understands these risks and is willing to proceed.    Kate Sable, MD, [facility]  09/12/2017 2:25 PM

## 2017-09-12 NOTE — Progress Notes (Signed)
SUBJECTIVE: I was called by Rosaria Ferries PA-C to the stress lab to evaluate this patient.  She presented for nuclear stress testing today.    Baseline ECG demonstrated sinus rhythm with nonspecific ST segment and T wave abnormalities in leads III, aVF, and V4 through V6.  With Lexiscan infusion, she began to developed significant horizontal ST segment depressions in leads V3 4 through V6, up to 2 mm in leads V4 and 1 mm in V5 and V6.  She complained of "heartburn ".  These abnormalities persisted into recovery.  There were also nonspecific ST segment and T wave abnormalities in leads V2 and V3.    Review of Systems: As per "subjective", otherwise negative.  Allergies  Allergen Reactions  . Beta Adrenergic Blockers Other (See Comments)    Dropped heart rate to the 40's  . Calcium Channel Blockers Other (See Comments)    Dropped HR into the 40's  . Naproxen Hives    Current Outpatient Medications  Medication Sig Dispense Refill  . ALPRAZolam (XANAX) 0.5 MG tablet Take 0.5 mg by mouth 2 (two) times daily.    Marland Kitchen amLODipine (NORVASC) 10 MG tablet Take 10 mg by mouth daily.    Marland Kitchen apixaban (ELIQUIS) 5 MG TABS tablet Take 5 mg by mouth 2 (two) times daily.    Marland Kitchen aspirin EC 81 MG tablet Take 81 mg by mouth daily.    . benazepril (LOTENSIN) 40 MG tablet Take 40 mg by mouth daily.    . Black Cohosh 540 MG CAPS Take 1 capsule by mouth daily.    . fish oil-omega-3 fatty acids 1000 MG capsule Take 1 g by mouth 3 (three) times daily.     Marland Kitchen glipiZIDE (GLUCOTROL) 5 MG tablet Take 5 mg by mouth 2 (two) times daily before a meal.    . hydrALAZINE (APRESOLINE) 25 MG tablet Take 25 mg by mouth 3 (three) times daily.     . hydrochlorothiazide (HYDRODIURIL) 25 MG tablet Take 25 mg by mouth daily.    . isosorbide mononitrate (IMDUR) 30 MG 24 hr tablet Take 30 mg by mouth daily.    . meclizine (ANTIVERT) 25 MG tablet Take 25 mg by mouth 3 (three) times daily as needed for dizziness.    . metFORMIN  (GLUCOPHAGE) 1000 MG tablet Take 1,000 mg by mouth 2 (two) times daily with a meal.    . omeprazole (PRILOSEC) 40 MG capsule Take 40 mg by mouth daily.    . potassium chloride SA (K-DUR,KLOR-CON) 20 MEQ tablet Take 20 mEq by mouth daily.    Marland Kitchen tiZANidine (ZANAFLEX) 4 MG tablet Take 1 tablet (4 mg total) by mouth every 6 (six) hours as needed for muscle spasms. 60 tablet 0   No current facility-administered medications for this encounter.    Facility-Administered Medications Ordered in Other Encounters  Medication Dose Route Frequency Provider Last Rate Last Dose  . aspirin 81 MG chewable tablet           . nitroGLYCERIN (NITROSTAT) 0.4 MG SL tablet           . technetium tetrofosmin (TC-MYOVIEW) injection 10 millicurie  10 millicurie Intravenous Once PRN Lavonia Dana, MD      . technetium tetrofosmin (TC-MYOVIEW) injection 30 millicurie  30 millicurie Intravenous Once PRN Lavonia Dana, MD        Past Medical History:  Diagnosis Date  . Anxiety neurosis   . Arthritis   . Dizziness   . GERD (gastroesophageal  reflux disease)   . Glaucoma   . Hiatal hernia   . Hyperlipemia   . Hypertension   . Joint pain   . Type 2 diabetes mellitus ([place])     Past Surgical History:  Procedure Laterality Date  . HERNIA REPAIR    . SHOULDER SURGERY     LEFT  . TOTAL KNEE ARTHROPLASTY     LEFT  . VAGINAL HYSTERECTOMY      Social History   Socioeconomic History  . Marital status: Married    Spouse name: Not on file  . Number of children: Not on file  . Years of education: Not on file  . Highest education level: Not on file  Occupational History  . Not on file  Social Needs  . Financial resource strain: Not on file  . Food insecurity:    Worry: Not on file    Inability: Not on file  . Transportation needs:    Medical: Not on file    Non-medical: Not on file  Tobacco Use  . Smoking status: Former Smoker    Packs/day: 1.25    Years: 16.00    Pack years: 20.00    Types: Cigarettes     Last attempt to quit: 03/25/1978    Years since quitting: 39.4  . Smokeless tobacco: Never Used  . Tobacco comment: quit more than 30 years ago  Substance and Sexual Activity  . Alcohol use: No  . Drug use: No  . Sexual activity: Not on file  Lifestyle  . Physical activity:    Days per week: Not on file    Minutes per session: Not on file  . Stress: Not on file  Relationships  . Social connections:    Talks on phone: Not on file    Gets together: Not on file    Attends religious service: Not on file    Active member of club or organization: Not on file    Attends meetings of clubs or organizations: Not on file    Relationship status: Not on file  . Intimate partner violence:    Fear of current or ex partner: Not on file    Emotionally abused: Not on file    Physically abused: Not on file    Forced sexual activity: Not on file  Other Topics Concern  . Not on file  Social History Narrative  . Not on file     Initial heart rate: 99 bpm  Blood pressure: 167/62 mmHg  Wt Readings from Last 3 Encounters:  09/05/17 203 lb (92.1 kg)  09/28/14 202 lb 1.6 oz (91.7 kg)  09/20/14 194 lb (88 kg)     PHYSICAL EXAM General: NAD HEENT: Normal. Neck: No JVD, no thyromegaly. Lungs: Clear to auscultation bilaterally with normal respiratory effort. CV: Regular rate and rhythm, normal S1/S2, no S3/S4, no murmur. No pretibial or periankle edema.  Abdomen: Soft, nontender, no distention.  Neurologic: Alert and oriented.  Psych: Normal affect. Skin: Normal. Musculoskeletal: No gross deformities.    ECG: Most recent ECG reviewed.   Labs: Lab Results  Component Value Date/Time   K 3.9 09/20/2014 09:49 AM   BUN 15 09/20/2014 09:49 AM   CREATININE 1.09 (H) 09/20/2014 09:49 AM   HGB 8.0 (L) 09/30/2014 09:35 AM     Lipids: No results found for: LDLCALC, LDLDIRECT, CHOL, TRIG, HDL     ASSESSMENT AND PLAN: 1.  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  I spoke to her about transfer to [facility] today for coronary angiography.  She is in agreement. Risks and benefits of cardiac catheterization have been discussed with the patient.  These include bleeding, infection, kidney damage, stroke, heart attack, death.  The patient understands these risks and is willing to proceed. She will be kept n.p.o.  2.  Hypertension: Blood pressure is mildly elevated.  She is on amlodipine, benazepril, hydrochlorothiazide, and hydralazine.  3.  History of pulmonary embolism: Currently on Eliquis 5 mg twice daily.  4.  Type 2 diabetes mellitus: Currently on glipizide and metformin.   Disposition: Follow up after coronary angiography  A high level of decision making was required for increased medical complexities.    Kate Sable, M.D., F.A.C.C.

## 2017-09-12 NOTE — ED Provider Notes (Signed)
[facility]E PROGRESSIVE CARE Provider Note   CSN: [id number] Arrival date & time: 09/12/17  1144     History   Chief Complaint Chief Complaint  Patient presents with  . Chest Pain    HPI Karen Tate is a 72 y.o. female.  HPI Patient sent from nuclear stress test after positive stress test.  During the procedure had pressure in her chest and EKG changes.  Sent in for admission and transfer to [facility] with catheterization.  Pain-free now.  Has had episodes of chest pain that come on with exertion over the last weeks to months.  No pain at rest.  No cough.  No shortness of breath.  Pain is dull.  No radiation to the back. Past Medical History:  Diagnosis Date  . Anxiety neurosis   . Arthritis   . Dizziness   . GERD (gastroesophageal reflux disease)   . Glaucoma   . Hiatal hernia   . Hyperlipemia   . Hypertension   . Joint pain   . Type 2 diabetes mellitus [facility])     Patient Active Problem List   Diagnosis Date Noted  . Unstable angina ([place]) 09/12/2017  . AP (angina pectoris) ([place])   . Abnormal stress test   . Chest pain   . 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 HERNIA REPAIR    . SHOULDER SURGERY     LEFT  . TOTAL KNEE ARTHROPLASTY     LEFT  . VAGINAL HYSTERECTOMY       OB History   None      Home Medications    Prior to Admission medications   Medication Sig Start Date End Date Taking? Authorizing Provider  ALPRAZolam Duanne Moron) 0.5 MG tablet Take 0.5 mg by mouth 2 (two) times daily.   Yes [provider]  amLODipine (NORVASC) 10 MG tablet Take 10 mg by mouth daily.   Yes [provider]  apixaban (ELIQUIS) 5 MG TABS tablet Take 5 mg by mouth 2 (two) times daily.   Yes [provider]  aspirin EC 81 MG tablet Take 81 mg by mouth daily.   Yes [provider]  benazepril (LOTENSIN) 40 MG tablet Take 40 mg by mouth daily.   Yes [provider]  Black Cohosh 540 MG CAPS Take 1 capsule by mouth daily.   Yes [provider]  fish oil-omega-3 fatty acids 1000 MG capsule Take 1 g by mouth 3 (three) times daily.    Yes [provider]  glipiZIDE (GLUCOTROL) 5 MG tablet Take 5 mg by mouth 2 (two) times daily before a meal.   Yes [provider]  hydrALAZINE (APRESOLINE) 25 MG tablet Take 25 mg by mouth 3 (three) times daily.    Yes [provider]  hydrochlorothiazide (HYDRODIURIL) 25 MG tablet Take 25 mg by mouth daily.   Yes [provider]  isosorbide mononitrate (IMDUR) 30 MG 24 hr tablet Take 30 mg by mouth daily.   Yes [provider]  meclizine (ANTIVERT) 25 MG tablet Take 25 mg by mouth 3 (three) times daily as needed for dizziness.   Yes [provider]  metFORMIN (GLUCOPHAGE) 1000 MG tablet Take 1,000 mg by mouth 2 (two) times daily with a meal.   Yes [provider]  omeprazole (PRILOSEC) 40 MG capsule Take 40 mg by mouth daily.   Yes [provider]  potassium chloride SA (K-DUR,KLOR-CON)  20 MEQ tablet Take 20 mEq by mouth daily.   Yes [provider]  tiZANidine (ZANAFLEX) 4 MG tablet Take 1 tablet (4 mg total) by mouth every 6 (six) hours as needed for muscle spasms. 10/02/14  Yes Ashok Pall, MD    Family History Family History  Problem Relation Age of Onset  . Diabetes Father   . Breast cancer Mother     Social History Social History   Tobacco Use  . Smoking status: Former Smoker    Packs/day: 1.25    Years: 16.00    Pack years: 20.00    Types: Cigarettes    Last attempt to quit: 03/25/1978    Years since quitting: 39.4  . Smokeless tobacco: Never Used  . Tobacco comment: quit more than 30 years ago  Substance Use Topics  . Alcohol use: No  . Drug use: No     Allergies   Beta adrenergic blockers; Calcium channel blockers; and Naproxen   Review of Systems Review of Systems  Constitutional: Negative for appetite  change.  HENT: Negative for congestion.   Respiratory: Negative for shortness of breath.   Cardiovascular: Positive for chest pain.  Gastrointestinal: Negative for abdominal pain.  Genitourinary: Negative for flank pain.  Musculoskeletal: Negative for back pain.  Skin: Negative for pallor.  Neurological: Negative for light-headedness.  Hematological: Negative for adenopathy.  Psychiatric/Behavioral: Negative for confusion.     Physical Exam Updated Vital Signs BP (!) 156/73   Pulse 97   Temp 98.3 F (36.8 C) (Oral)   Resp (!) 26   Ht 5\' 5"  (1.651 m)   Wt 92.1 kg (203 lb)   SpO2 96%   BMI 33.78 kg/m   Physical Exam  Constitutional: She appears well-developed.  HENT:  Head: Normocephalic.  Eyes: EOM are normal.  Neck: Normal range of motion.  Cardiovascular: Normal rate.  Pulmonary/Chest: Effort normal.  Abdominal: There is no tenderness.  Musculoskeletal: Normal range of motion.       Right lower leg: She exhibits no edema.       Left lower leg: She exhibits no edema.  Neurological: She is alert.  Skin: Skin is warm. Capillary refill takes less than 2 seconds.  Psychiatric: Her mood appears anxious.     ED Treatments / Results  Labs (all labs ordered are listed, but only abnormal results are displayed) Labs Reviewed  BASIC METABOLIC PANEL - Abnormal; Notable for the following components:      Result Value   Glucose, Bld 184 (*)    Creatinine, Ser 1.15 (*)    GFR calc non Af Amer 46 (*)    GFR calc Af Amer 53 (*)    All other components within normal limits  CBC - Abnormal; Notable for the following components:   RBC 3.57 (*)    Hemoglobin 10.4 (*)    HCT 33.5 (*)    Platelets 412 (*)    All other components within normal limits  HEPARIN LEVEL (UNFRACTIONATED)  APTT  HEPARIN LEVEL (UNFRACTIONATED)  APTT  TROPONIN I  TROPONIN I  TROPONIN I  TSH  HEMOGLOBIN A1C  I-STAT TROPONIN, ED    EKG EKG Interpretation  Date/Time:  Friday September 12 2017  11:55:54 EDT Ventricular Rate:  105 PR Interval:    QRS Duration: 91 QT Interval:  356 QTC Calculation: 471 R Axis:   71 Text Interpretation:  Sinus tachycardia Repol abnrm, severe global ischemia (LM/MVD) Confirmed by Davonna Belling [id number]) on 09/12/2017 3:21:54 PM  Radiology Dg Chest Port 1 View  Result Date: 09/12/2017 CLINICAL DATA:  Burning in the chest. History of diabetes, hypertension, smoking. EXAM: PORTABLE CHEST 1 VIEW COMPARISON:  04/02/2016 CT FINDINGS: Heart size is enlarged. There is mild prominence of interstitial markings consistent with mild pulmonary edema. No focal consolidations. No pleural effusions. IMPRESSION: Mild pulmonary edema. Electronically Signed   By: Nolon Nations M.D.   On: 09/12/2017 12:32    Procedures Procedures (including critical care time)  Medications Ordered in ED Medications  heparin ADULT infusion 100 units/mL (25000 units/246mL sodium chloride 0.45%) (950 Units/hr Intravenous New Bag/Given 09/12/17 1248)  aspirin chewable tablet 324 mg (324 mg Oral Not Given 09/12/17 1427)  nitroGLYCERIN (NITROSTAT) SL tablet 0.4 mg (has no administration in time range)  acetaminophen (TYLENOL) tablet 650 mg (has no administration in time range)  ondansetron (ZOFRAN) injection 4 mg (has no administration in time range)  zolpidem (AMBIEN) tablet 5 mg (has no administration in time range)  sodium chloride flush (NS) 0.9 % injection 3 mL (has no administration in time range)  sodium chloride flush (NS) 0.9 % injection 3 mL (has no administration in time range)  0.9 %  sodium chloride infusion (has no administration in time range)  ALPRAZolam (XANAX) tablet 0.25 mg (has no administration in time range)  insulin aspart (novoLOG) injection 0-15 Units (has no administration in time range)  insulin aspart (novoLOG) injection 0-5 Units (has no administration in time range)  sodium chloride flush (NS) 0.9 % injection 3 mL (has no administration in time range)    sodium chloride flush (NS) 0.9 % injection 3 mL (has no administration in time range)  0.9 %  sodium chloride infusion (has no administration in time range)  0.9% sodium chloride infusion (has no administration in time range)    Followed by  0.9% sodium chloride infusion (has no administration in time range)  ALPRAZolam (XANAX) tablet 0.5 mg (has no administration in time range)  amLODipine (NORVASC) tablet 10 mg (has no administration in time range)  aspirin EC tablet 81 mg (has no administration in time range)  benazepril (LOTENSIN) tablet 40 mg (has no administration in time range)  fish oil-omega-3 fatty acids capsule 1 g (has no administration in time range)  glipiZIDE (GLUCOTROL) tablet 5 mg (has no administration in time range)  hydrALAZINE (APRESOLINE) tablet 25 mg (has no administration in time range)  isosorbide mononitrate (IMDUR) 24 hr tablet 30 mg (has no administration in time range)  meclizine (ANTIVERT) tablet 25 mg (has no administration in time range)  pantoprazole (PROTONIX) EC tablet 40 mg (has no administration in time range)  potassium chloride SA (K-DUR,KLOR-CON) CR tablet 20 mEq (has no administration in time range)  tiZANidine (ZANAFLEX) tablet 4 mg (has no administration in time range)  LORazepam (ATIVAN) injection 0.5 mg (0.5 mg Intravenous Given 09/12/17 1301)     Initial Impression / Assessment and Plan / ED Course  I have reviewed the triage vital signs and the nursing notes.  Pertinent labs & imaging results that were available during my care of the patient were reviewed by me and considered in my medical decision making (see chart for details).     Patient with chest pain while at stress test.  Did have dynamic EKG changes.  Pain-free now.  However will require urgent catheterization.  Transfer to [place] Performed by: Davonna Belling Total critical care time: 30 minutes Critical care time was exclusive of separately billable  procedures and  treating other patients. Critical care was necessary to treat or prevent imminent or life-threatening deterioration. Critical care was time spent personally by me on the following activities: development of treatment plan with patient and/or surrogate as well as nursing, discussions with consultants, evaluation of patient's response to treatment, examination of patient, obtaining history from patient or surrogate, ordering and performing treatments and interventions, ordering and review of laboratory studies, ordering and review of radiographic studies, pulse oximetry and re-evaluation of patient's condition.   Final Clinical Impressions(s) / ED Diagnoses   Final diagnoses:  Angina pectoris [facility])    ED Discharge Orders    None       Davonna Belling, MD 09/12/17 [phone]

## 2017-09-12 NOTE — Progress Notes (Signed)
[place] for Herpain Indication: chest pain/ACS  Allergies  Allergen Reactions  . Beta Adrenergic Blockers Other (See Comments)    Dropped heart rate to the 40's  . Calcium Channel Blockers Other (See Comments)    Dropped HR into the 40's  . Naproxen Hives    Patient Measurements: Height: 5\' 5"  (165.1 cm) Weight: 203 lb (92.1 kg) IBW/kg (Calculated) : 57 HEPARIN DW (KG): 77.5  Vital Signs: Temp: 97.9 F (36.6 C) (06/21 2035) Temp Source: Oral (06/21 2035) BP: 142/63 (06/21 2035) Pulse Rate: 85 (06/21 2035)  Labs: Recent Labs    09/12/17 1150 09/12/17 1157 09/12/17 1244 09/12/17 1450 09/12/17 1953  HGB 10.4*  --   --   --   --   HCT 33.5*  --   --   --   --   PLT 412*  --   --   --   --   APTT  --  35  --   --  47*  HEPARINUNFRC  --   --  0.59  --  0.42  CREATININE 1.15*  --   --   --   --   TROPONINI  --   --   --  0.70* 4.33*     Medical History: Past Medical History:  Diagnosis Date  . Anxiety neurosis   . Arthritis   . Dizziness   . GERD (gastroesophageal reflux disease)   . Glaucoma   . Hiatal hernia   . Hyperlipemia   . Hypertension   . Joint pain   . Type 2 diabetes mellitus (H[facility])      Assessment: 72 yo presented with chest pain. Patient on eliquis PTA and last dose 6/20 at 2100. Pharmacy asked to start Heparin. Will not bolus so will monitor APTT and HL until correlates and get base line labs.   Evening update: HL is therapeutic, but likely due to Eliquis effect, as last dose was yesterday. Will dose based on aPTTs for now. Initial aPTT is SUBtherapeutic.  Goal of Therapy:  Heparin level 0.3-0.7 units/ml Monitor platelets by anticoagulation protocol: Yes   Plan:  -Increase heparin to 1150 units/hr -Daily HL, CBC -Check confirmatory in the morning  Harvel Quale 09/12/2017 9:51 PM

## 2017-09-12 NOTE — Progress Notes (Signed)
Cardiac cath now scheduled for Monday.  Carb Modified diet ordered by PA.  Patient is aware of the change in plans.

## 2017-09-12 NOTE — Progress Notes (Addendum)
ANTICOAGULATION CONSULT NOTE - Initial Consult  Pharmacy Consult for Herpain Indication: chest pain/ACS  Allergies  Allergen Reactions  . Beta Adrenergic Blockers Other (See Comments)    Dropped heart rate to the 40's  . Calcium Channel Blockers Other (See Comments)    Dropped HR into the 40's  . Naproxen Hives    Patient Measurements: Height: 5\' 5"  (165.1 cm) Weight: 203 lb (92.1 kg) IBW/kg (Calculated) : 57 HEPARIN DW (KG): 77.5  Vital Signs: Temp: 98.3 F (36.8 C) (06/21 1205) Temp Source: Oral (06/21 1205) BP: 171/81 (06/21 1205) Pulse Rate: 110 (06/21 1205)  Labs: Recent Labs    09/12/17 1150  HGB 10.4*  HCT 33.5*  PLT 412*  CREATININE 1.15*    Estimated Creatinine Clearance: 48.8 mL/min (A) (by C-G formula based on SCr of 1.15 mg/dL (H)).   Medical History: Past Medical History:  Diagnosis Date  . Anxiety neurosis   . Arthritis   . Dizziness   . GERD (gastroesophageal reflux disease)   . Glaucoma   . Hiatal hernia   . Hyperlipemia   . Hypertension   . Joint pain   . Type 2 diabetes mellitus (HCC)     Medications:  See med rec  Assessment: 72 yo presented with chest pain. Patient on eliquis PTA and last dose 6/20 at 2100. Pharmacy asked to start Heparin. Will not bolus so will monitor APTT and HL until correlates and get base line labs.   Goal of Therapy:  Heparin level 0.3-0.7 units/ml Monitor platelets by anticoagulation protocol: Yes   Plan:  Start heparin infusion at 950 units/hr Check APTT and anti-Xa level at baseline and in 6-8 hours and daily while on heparin Continue to monitor H&H and platelets  Isac Sarna, BS [place], BCPS Clinical Pharmacist Pager [phone] 09/12/2017,12:38 PM

## 2017-09-12 NOTE — ED Notes (Signed)
[facility] sending a truck for transport

## 2017-09-12 NOTE — Progress Notes (Signed)
Pt made aware that cath changed to Monday due to Eliquis last pm.  Diet order placed.  Pt is understanding and her family are with you.

## 2017-09-12 NOTE — Progress Notes (Signed)
Patient admitted to [phone] from [facility] via ambulance.  Bed in low position, wheels locked.  Patient denies chest pain/shortness of breath.  Portable telemetry monitor applied.  Patient oriented to environment, including call bell, TV, meal times, and hourly rounding.  Patient is currently NPO awaiting a heart catheterization.

## 2017-09-12 NOTE — ED Triage Notes (Signed)
Pt was getting a nuclear stress test and started to have a burning sensation in her chest. Per cardiac rehab [facility].

## 2017-09-12 NOTE — ED Notes (Signed)
Report given to [facility] 

## 2017-09-12 NOTE — Progress Notes (Signed)
CRITICAL VALUE ALERT  Critical Value:  Troponin 0.77  Date & Time Notied:  09/12/2017 1628  Provider Notified: Cecilie Kicks  Orders Received/Actions taken: On appropriate medical therapy

## 2017-09-12 NOTE — Progress Notes (Signed)
Patient scheduled for an outpatient echo, however patient being transferred to [facility] after stress test completed. Echo not completed.

## 2017-09-13 ENCOUNTER — Other Ambulatory Visit: Payer: Self-pay

## 2017-09-13 LAB — COMPREHENSIVE METABOLIC PANEL WITH GFR
ALT: 23 U/L (ref 14–54)
AST: 32 U/L (ref 15–41)
Albumin: 3.2 g/dL — ABNORMAL LOW (ref 3.5–5.0)
Alkaline Phosphatase: 35 U/L — ABNORMAL LOW (ref 38–126)
Anion gap: 7 (ref 5–15)
BUN: 13 mg/dL (ref 6–20)
CO2: 25 mmol/L (ref 22–32)
Calcium: 9.2 mg/dL (ref 8.9–10.3)
Chloride: 108 mmol/L (ref 101–111)
Creatinine, Ser: 1.09 mg/dL — ABNORMAL HIGH (ref 0.44–1.00)
GFR calc Af Amer: 57 mL/min — ABNORMAL LOW
GFR calc non Af Amer: 49 mL/min — ABNORMAL LOW
Glucose, Bld: 172 mg/dL — ABNORMAL HIGH (ref 65–99)
Potassium: 4 mmol/L (ref 3.5–5.1)
Sodium: 140 mmol/L (ref 135–145)
Total Bilirubin: 0.4 mg/dL (ref 0.3–1.2)
Total Protein: 6.3 g/dL — ABNORMAL LOW (ref 6.5–8.1)

## 2017-09-13 LAB — LIPID PANEL
Cholesterol: 152 mg/dL (ref 0–200)
HDL: 35 mg/dL — ABNORMAL LOW
LDL Cholesterol: 75 mg/dL (ref 0–99)
Total CHOL/HDL Ratio: 4.3 ratio
Triglycerides: 209 mg/dL — ABNORMAL HIGH
VLDL: 42 mg/dL — ABNORMAL HIGH (ref 0–40)

## 2017-09-13 LAB — CBC
HCT: 29.9 % — ABNORMAL LOW (ref 36.0–46.0)
Hemoglobin: 9.3 g/dL — ABNORMAL LOW (ref 12.0–15.0)
MCH: 28.9 pg (ref 26.0–34.0)
MCHC: 31.1 g/dL (ref 30.0–36.0)
MCV: 92.9 fL (ref 78.0–100.0)
Platelets: 351 10*3/uL (ref 150–400)
RBC: 3.22 MIL/uL — ABNORMAL LOW (ref 3.87–5.11)
RDW: 15 % (ref 11.5–15.5)
WBC: 4.4 10*3/uL (ref 4.0–10.5)

## 2017-09-13 LAB — APTT: aPTT: 86 seconds — ABNORMAL HIGH (ref 24–36)

## 2017-09-13 LAB — GLUCOSE, CAPILLARY
Glucose-Capillary: 157 mg/dL — ABNORMAL HIGH (ref 65–99)
Glucose-Capillary: 160 mg/dL — ABNORMAL HIGH (ref 65–99)
Glucose-Capillary: 171 mg/dL — ABNORMAL HIGH (ref 65–99)
Glucose-Capillary: 137 mg/dL — ABNORMAL HIGH (ref 65–99)
Glucose-Capillary: 167 mg/dL — ABNORMAL HIGH (ref 65–99)

## 2017-09-13 LAB — TROPONIN I: Troponin I: 3.89 ng/mL (ref <0.03)

## 2017-09-13 LAB — HEPARIN LEVEL (UNFRACTIONATED): Heparin Unfractionated: 0.63 [IU]/mL (ref 0.30–0.70)

## 2017-09-13 NOTE — Progress Notes (Signed)
CRITICAL VALUE ALERT  Critical Value: Troponin 4.33  Date & Time Notied: 09/12/2017 2127 Provider Notified: [facility]  Orders Received/Actions taken: Heparin infusing

## 2017-09-13 NOTE — Progress Notes (Addendum)
Subjective:  No complaints of chest discomfort.  Objective:  Vital Signs in the last 24 hours: BP (!) 169/71   Pulse 77   Temp 98.3 F (36.8 C) (Oral)   Resp 18   Ht 5\' 5"  (1.651 m)   Wt 89.9 kg (198 lb 1.6 oz)   SpO2 98%   BMI 32.97 kg/m   Physical Exam: Obese black female currently in no acute distress Lungs:  Clear Cardiac:  Regular rhythm, normal S1 and S2, no S3 Extremities:  No edema present  Intake/Output from previous day: 06/21 0701 - 06/22 0700 In: 505.3 [P.O.:480; I.V.:25.3] Out: 1300 [Urine:1300]  Weight Filed Weights   09/12/17 1147 09/13/17 0500  Weight: 92.1 kg (203 lb) 89.9 kg (198 lb 1.6 oz)    Lab Results: Basic Metabolic Panel: Recent Labs    09/12/17 1150 09/13/17 0747  NA 139 140  K 3.9 4.0  CL 103 108  CO2 25 25  GLUCOSE 184* 172*  BUN 14 13  CREATININE 1.15* 1.09*   CBC: Recent Labs    09/12/17 1150 09/13/17 0747  WBC 7.0 4.4  HGB 10.4* 9.3*  HCT 33.5* 29.9*  MCV 93.8 92.9  PLT 412* 351   Cardiac Enzymes: Troponin (Point of Care Test) Recent Labs    09/12/17 1203  TROPIPOC 0.03   Cardiac Panel (last 3 results) Recent Labs    09/12/17 1450 09/12/17 1953 09/13/17 0423  TROPONINI 0.70* 4.33* 3.89*    Telemetry: Sinus rhythm  Assessment/Plan:  1.  Non-STEMI with abnormal EKG 2.  History of pulmonary emboli 3.  Previous chronic anticoagulation 4.  Obesity  Recommendations:  Her hemoglobin has dropped a little bit and we will need to monitor this.  Plan for catheterization on Monday. Cardiac catheterization was discussed with the patient yesterday and questions were answered.  Kerry Hough  MD [facility] Cardiology  09/13/2017, 11:07 AM

## 2017-09-13 NOTE — Progress Notes (Signed)
[place] for Herpain Indication: chest pain/ACS  Patient Measurements: Height: 5\' 5"  (165.1 cm) Weight: 198 lb 1.6 oz (89.9 kg) IBW/kg (Calculated) : 57 HEPARIN DW (KG): 77.5  Vital Signs: Temp: 98.3 F (36.8 C) (06/22 0500) Temp Source: Oral (06/22 0500) BP: 169/71 (06/22 0812) Pulse Rate: 77 (06/22 0524)  Labs: Recent Labs    09/12/17 1150 09/12/17 1157 09/12/17 1244 09/12/17 1450 09/12/17 1953 09/13/17 0423 09/13/17 0747  HGB 10.4*  --   --   --   --   --  9.3*  HCT 33.5*  --   --   --   --   --  29.9*  PLT 412*  --   --   --   --   --  351  APTT  --  35  --   --  47*  --  86*  HEPARINUNFRC  --   --  0.59  --  0.42  --  0.63  CREATININE 1.15*  --   --   --   --   --   --   TROPONINI  --   --   --  0.70* 4.33* 3.89*  --     Assessment: 72 yo presented with chest pain. Patient on eliquis PTA and last dose 6/20 at 2100. Pharmacy asked to start Heparin.  Heparin level and aPTT are therapeutic this morning and likely correlating. Will confirm with aPTT/HL tomorrow. CBC stable, no bleeding noted. Troponins remain elevated and cath planned for Monday.   Goal of Therapy:  Heparin level 0.3-0.7 units/ml Monitor platelets by anticoagulation protocol: Yes   Plan:  -Continue heparin at 1150 units/hr -Daily HL/aPTT, [place] PharmD PGY1 Acute Care Pharmacy Resident 09/13/2017 9:04 AM Phone: [phone] until 3:30 then check AMION

## 2017-09-14 LAB — CBC
HCT: 31.6 % — ABNORMAL LOW (ref 36.0–46.0)
Hemoglobin: 9.9 g/dL — ABNORMAL LOW (ref 12.0–15.0)
MCH: 29.4 pg (ref 26.0–34.0)
MCHC: 31.3 g/dL (ref 30.0–36.0)
MCV: 93.8 fL (ref 78.0–100.0)
Platelets: 331 10*3/uL (ref 150–400)
RBC: 3.37 MIL/uL — ABNORMAL LOW (ref 3.87–5.11)
RDW: 15.1 % (ref 11.5–15.5)
WBC: 5 10*3/uL (ref 4.0–10.5)

## 2017-09-14 LAB — GLUCOSE, CAPILLARY
Glucose-Capillary: 160 mg/dL — ABNORMAL HIGH (ref 65–99)
Glucose-Capillary: 173 mg/dL — ABNORMAL HIGH (ref 65–99)
Glucose-Capillary: 160 mg/dL — ABNORMAL HIGH (ref 65–99)
Glucose-Capillary: 132 mg/dL — ABNORMAL HIGH (ref 65–99)

## 2017-09-14 LAB — HEPARIN LEVEL (UNFRACTIONATED): Heparin Unfractionated: 0.56 IU/mL (ref 0.30–0.70)

## 2017-09-14 LAB — APTT: aPTT: 79 seconds — ABNORMAL HIGH (ref 24–36)

## 2017-09-14 MED ORDER — SODIUM CHLORIDE 0.9 % IV SOLN: 250.0000 mL | INTRAVENOUS | Status: DC | PRN

## 2017-09-14 MED ORDER — SODIUM CHLORIDE 0.9 % WEIGHT BASED INFUSION: 1.0000 mL/kg/h | INTRAVENOUS | Status: DC

## 2017-09-14 MED ORDER — ASPIRIN 81 MG PO CHEW
81.0000 mg | CHEWABLE_TABLET | ORAL | Status: AC
  Administered 2017-09-15: 81 mg via ORAL
  Filled 2017-09-14: qty 1

## 2017-09-14 MED ORDER — SODIUM CHLORIDE 0.9% FLUSH: 3.0000 mL | Freq: Two times a day (BID) | INTRAVENOUS | Status: DC

## 2017-09-14 MED ORDER — SODIUM CHLORIDE 0.9 % WEIGHT BASED INFUSION
3.0000 mL/kg/h | INTRAVENOUS | Status: DC
  Administered 2017-09-15: 3 mL/kg/h via INTRAVENOUS

## 2017-09-14 MED ORDER — SODIUM CHLORIDE 0.9% FLUSH: 3.0000 mL | INTRAVENOUS | Status: DC | PRN

## 2017-09-14 NOTE — H&P (View-Only) (Signed)
Subjective:  No complaints of chest discomfort.  Objective:  Vital Signs in the last 24 hours: BP 130/65 (BP Location: Right Arm)   Pulse 84   Temp 98.4 F (36.9 C) (Oral)   Resp 17   Ht 5\' 5"  (1.651 m)   Wt 90.4 kg (199 lb 3.2 oz)   SpO2 98%   BMI 33.15 kg/m   Physical Exam: Obese black female currently in no acute distress Lungs:  Clear Cardiac:  Regular rhythm, normal S1 and S2, no S3 Extremities:  No edema present  Intake/Output from previous day: 06/22 0701 - 06/23 0700 In: 1327.3 [P.O.:1080; I.V.:247.3] Out: -   Weight Filed Weights   09/12/17 1147 09/13/17 0500 09/14/17 0514  Weight: 92.1 kg (203 lb) 89.9 kg (198 lb 1.6 oz) 90.4 kg (199 lb 3.2 oz)    Lab Results: Basic Metabolic Panel: Recent Labs    09/12/17 1150 09/13/17 0747  NA 139 140  K 3.9 4.0  CL 103 108  CO2 25 25  GLUCOSE 184* 172*  BUN 14 13  CREATININE 1.15* 1.09*   CBC: Recent Labs    09/13/17 0747 09/14/17 0726  WBC 4.4 5.0  HGB 9.3* 9.9*  HCT 29.9* 31.6*  MCV 92.9 93.8  PLT 351 331   Cardiac Enzymes: Troponin (Point of Care Test) Recent Labs    09/12/17 1203  TROPIPOC 0.03   Cardiac Panel (last 3 results) Recent Labs    09/12/17 1450 09/12/17 1953 09/13/17 0423  TROPONINI 0.70* 4.33* 3.89*    Telemetry: Sinus rhythm  Assessment/Plan:  1.  Non-STEMI with abnormal EKG 2.  History of pulmonary emboli 3.  Previous chronic anticoagulation 4.  Obesity  Recommendations:  Recheck hemoglobin..  Plan for catheterization on Monday. Cardiac catheterization was discussed with the patient yesterday and questions were answered.  Kerry Hough  MD [facility] Cardiology  09/14/2017, 12:27 PM

## 2017-09-14 NOTE — Progress Notes (Signed)
Subjective:  No complaints of chest discomfort.  Objective:  Vital Signs in the last 24 hours: BP 130/65 (BP Location: Right Arm)   Pulse 84   Temp 98.4 F (36.9 C) (Oral)   Resp 17   Ht 5\' 5"  (1.651 m)   Wt 90.4 kg (199 lb 3.2 oz)   SpO2 98%   BMI 33.15 kg/m   Physical Exam: Obese black female currently in no acute distress Lungs:  Clear Cardiac:  Regular rhythm, normal S1 and S2, no S3 Extremities:  No edema present  Intake/Output from previous day: 06/22 0701 - 06/23 0700 In: 1327.3 [P.O.:1080; I.V.:247.3] Out: -   Weight Filed Weights   09/12/17 1147 09/13/17 0500 09/14/17 0514  Weight: 92.1 kg (203 lb) 89.9 kg (198 lb 1.6 oz) 90.4 kg (199 lb 3.2 oz)    Lab Results: Basic Metabolic Panel: Recent Labs    09/12/17 1150 09/13/17 0747  NA 139 140  K 3.9 4.0  CL 103 108  CO2 25 25  GLUCOSE 184* 172*  BUN 14 13  CREATININE 1.15* 1.09*   CBC: Recent Labs    09/13/17 0747 09/14/17 0726  WBC 4.4 5.0  HGB 9.3* 9.9*  HCT 29.9* 31.6*  MCV 92.9 93.8  PLT 351 331   Cardiac Enzymes: Troponin (Point of Care Test) Recent Labs    09/12/17 1203  TROPIPOC 0.03   Cardiac Panel (last 3 results) Recent Labs    09/12/17 1450 09/12/17 1953 09/13/17 0423  TROPONINI 0.70* 4.33* 3.89*    Telemetry: Sinus rhythm  Assessment/Plan:  1.  Non-STEMI with abnormal EKG 2.  History of pulmonary emboli 3.  Previous chronic anticoagulation 4.  Obesity  Recommendations:  Recheck hemoglobin..  Plan for catheterization on Monday. Cardiac catheterization was discussed with the patient yesterday and questions were answered.  Kerry Hough  MD [facility] Card[facility]  09/14/2017, 12:27 PM

## 2017-09-14 NOTE — Progress Notes (Signed)
I made multiple attempts to access patient education video, all of which were unsuccessful. Provided patient with written material instead.

## 2017-09-14 NOTE — Progress Notes (Signed)
ANTICOAGULATION CONSULT NOTE  Pharmacy Consult for Herpain Indication: chest pain/ACS  Patient Measurements: Height: 5\' 5"  (165.1 cm) Weight: 199 lb 3.2 oz (90.4 kg) IBW/kg (Calculated) : 57 HEPARIN DW (KG): 77.5  Vital Signs: Temp: 98.4 F (36.9 C) (06/23 0514) Temp Source: Oral (06/23 0514) BP: 130/65 (06/23 0514) Pulse Rate: 84 (06/23 0514)  Labs: Recent Labs    09/12/17 1150  09/12/17 1450 09/12/17 1953 09/13/17 0423 09/13/17 0747 09/14/17 0726  HGB 10.4*  --   --   --   --  9.3* 9.9*  HCT 33.5*  --   --   --   --  29.9* 31.6*  PLT 412*  --   --   --   --  351 331  APTT  --    < >  --  47*  --  86* 79*  HEPARINUNFRC  --    < >  --  0.42  --  0.63 0.56  CREATININE 1.15*  --   --   --   --  1.09*  --   TROPONINI  --   --  0.70* 4.33* 3.89*  --   --    < > = values in this interval not displayed.    Assessment: 72 yo presented with chest pain. Patient on eliquis PTA and last dose 6/20 at 2100. Pharmacy asked to start Heparin.  Heparin level and aPTT are therapeutic this morning and likely correlating.   Goal of Therapy:  Heparin level 0.3-0.7 units/ml Monitor platelets by anticoagulation protocol: Yes   Plan:  -Continue heparin at 1150 units/hr -Daily HL, CBC -Cath planned for Monday   Thank you Anette Guarneri, PharmD [phone] 09/14/2017 11:22 AM

## 2017-09-15 ENCOUNTER — Encounter (HOSPITAL_COMMUNITY): Payer: Self-pay | Admitting: Cardiovascular Disease

## 2017-09-15 ENCOUNTER — Inpatient Hospital Stay (HOSPITAL_COMMUNITY)
Admit: 2017-09-15 | Discharge: 2017-09-15 | Disposition: A | Discharge status: Home / Self Care | Payer: Medicare Other | Attending: Cardiovascular Disease | Admitting: Cardiovascular Disease

## 2017-09-15 ENCOUNTER — Encounter (HOSPITAL_COMMUNITY)
Admission: EM | Disposition: A | Discharge status: Home / Self Care | Payer: Self-pay | Source: Ambulatory Visit | Attending: Thoracic Surgery (Cardiothoracic Vascular Surgery)

## 2017-09-15 ENCOUNTER — Other Ambulatory Visit: Payer: Self-pay | Admitting: *Deleted

## 2017-09-15 DIAGNOSIS — E119 Type 2 diabetes mellitus without complications: Secondary | ICD-10-CM

## 2017-09-15 DIAGNOSIS — I25110 Atherosclerotic heart disease of native coronary artery with unstable angina pectoris: Secondary | ICD-10-CM

## 2017-09-15 DIAGNOSIS — I214 Non-ST elevation (NSTEMI) myocardial infarction: Secondary | ICD-10-CM

## 2017-09-15 DIAGNOSIS — I10 Essential (primary) hypertension: Secondary | ICD-10-CM

## 2017-09-15 DIAGNOSIS — I2510 Atherosclerotic heart disease of native coronary artery without angina pectoris: Secondary | ICD-10-CM

## 2017-09-15 DIAGNOSIS — I2699 Other pulmonary embolism without acute cor pulmonale: Secondary | ICD-10-CM

## 2017-09-15 HISTORY — PX: LEFT HEART CATH AND CORONARY ANGIOGRAPHY: CATH118249

## 2017-09-15 LAB — CBC
HCT: 34.6 % — ABNORMAL LOW (ref 36.0–46.0)
Hemoglobin: 10.6 g/dL — ABNORMAL LOW (ref 12.0–15.0)
MCH: 28.8 pg (ref 26.0–34.0)
MCHC: 30.6 g/dL (ref 30.0–36.0)
MCV: 94 fL (ref 78.0–100.0)
Platelets: 411 10*3/uL — ABNORMAL HIGH (ref 150–400)
RBC: 3.68 MIL/uL — ABNORMAL LOW (ref 3.87–5.11)
RDW: 15.2 % (ref 11.5–15.5)
WBC: 6.2 10*3/uL (ref 4.0–10.5)

## 2017-09-15 LAB — GLUCOSE, CAPILLARY
Glucose-Capillary: 239 mg/dL — ABNORMAL HIGH (ref 65–99)
Glucose-Capillary: 197 mg/dL — ABNORMAL HIGH (ref 65–99)
Glucose-Capillary: 129 mg/dL — ABNORMAL HIGH (ref 65–99)
Glucose-Capillary: 139 mg/dL — ABNORMAL HIGH (ref 65–99)

## 2017-09-15 SURGERY — LEFT HEART CATH AND CORONARY ANGIOGRAPHY
Anesthesia: LOCAL

## 2017-09-15 MED ORDER — HEPARIN SODIUM (PORCINE) 1000 UNIT/ML IJ SOLN
INTRAMUSCULAR | Status: DC | PRN
  Administered 2017-09-15: 4500 [IU] via INTRAVENOUS

## 2017-09-15 MED ORDER — HEPARIN SODIUM (PORCINE) 1000 UNIT/ML IJ SOLN
INTRAMUSCULAR | Status: AC
  Filled 2017-09-15: qty 1

## 2017-09-15 MED ORDER — VERAPAMIL HCL 2.5 MG/ML IV SOLN
INTRAVENOUS | Status: AC
  Filled 2017-09-15: qty 2

## 2017-09-15 MED ORDER — SODIUM CHLORIDE 0.9 % IV SOLN
INTRAVENOUS | Status: AC
  Administered 2017-09-15: 10:00:00 via INTRAVENOUS

## 2017-09-15 MED ORDER — MIDAZOLAM HCL 2 MG/2ML IJ SOLN
INTRAMUSCULAR | Status: DC | PRN
  Administered 2017-09-15 (×2): 1 mg via INTRAVENOUS

## 2017-09-15 MED ORDER — ATORVASTATIN CALCIUM 80 MG PO TABS
80.0000 mg | ORAL_TABLET | Freq: Every day | ORAL | Status: DC
  Administered 2017-09-15 – 2017-09-24 (×9): 80 mg via ORAL
  Filled 2017-09-15 (×9): qty 1

## 2017-09-15 MED ORDER — ACETAMINOPHEN 325 MG PO TABS
650.0000 mg | ORAL_TABLET | ORAL | Status: DC | PRN
  Administered 2017-09-15: 650 mg via ORAL
  Filled 2017-09-15: qty 2

## 2017-09-15 MED ORDER — VERAPAMIL HCL 2.5 MG/ML IV SOLN
INTRA_ARTERIAL | Status: DC | PRN
  Administered 2017-09-15: 10 mL via INTRA_ARTERIAL

## 2017-09-15 MED ORDER — LIDOCAINE HCL (PF) 1 % IJ SOLN
INTRAMUSCULAR | Status: DC | PRN
  Administered 2017-09-15: 2 mL

## 2017-09-15 MED ORDER — IOPAMIDOL (ISOVUE-370) INJECTION 76%
INTRAVENOUS | Status: DC | PRN
  Administered 2017-09-15: 100 mL via INTRA_ARTERIAL

## 2017-09-15 MED ORDER — SODIUM CHLORIDE 0.9% FLUSH: 3.0000 mL | INTRAVENOUS | Status: DC | PRN

## 2017-09-15 MED ORDER — MORPHINE SULFATE (PF) 2 MG/ML IV SOLN: 2.0000 mg | INTRAVENOUS | Status: DC | PRN

## 2017-09-15 MED ORDER — SODIUM CHLORIDE 0.9% FLUSH
3.0000 mL | Freq: Two times a day (BID) | INTRAVENOUS | Status: DC
  Administered 2017-09-15 – 2017-09-17 (×5): 3 mL via INTRAVENOUS

## 2017-09-15 MED ORDER — FENTANYL CITRATE (PF) 100 MCG/2ML IJ SOLN
INTRAMUSCULAR | Status: AC
  Filled 2017-09-15: qty 2

## 2017-09-15 MED ORDER — HEPARIN (PORCINE) IN NACL 1000-0.9 UT/500ML-% IV SOLN
INTRAVENOUS | Status: AC
  Filled 2017-09-15: qty 1000

## 2017-09-15 MED ORDER — MIDAZOLAM HCL 2 MG/2ML IJ SOLN
INTRAMUSCULAR | Status: AC
  Filled 2017-09-15: qty 2

## 2017-09-15 MED ORDER — NITROGLYCERIN 1 MG/10 ML FOR IR/CATH LAB
INTRA_ARTERIAL | Status: AC
  Filled 2017-09-15: qty 10

## 2017-09-15 MED ORDER — SODIUM CHLORIDE 0.9 % IV SOLN: 250.0000 mL | INTRAVENOUS | Status: DC | PRN

## 2017-09-15 MED ORDER — LIDOCAINE HCL (PF) 1 % IJ SOLN
INTRAMUSCULAR | Status: AC
  Filled 2017-09-15: qty 30

## 2017-09-15 MED ORDER — FENTANYL CITRATE (PF) 100 MCG/2ML IJ SOLN
INTRAMUSCULAR | Status: DC | PRN
  Administered 2017-09-15 (×2): 25 ug via INTRAVENOUS

## 2017-09-15 MED ORDER — ONDANSETRON HCL 4 MG/2ML IJ SOLN: 4.0000 mg | Freq: Four times a day (QID) | INTRAMUSCULAR | Status: DC | PRN

## 2017-09-15 MED ORDER — HEPARIN (PORCINE) IN NACL 2-0.9 UNITS/ML
INTRAMUSCULAR | Status: AC | PRN
  Administered 2017-09-15: 1000 mL

## 2017-09-15 SURGICAL SUPPLY — 14 items
CATH INFINITI 5 FR JL3.5 (CATHETERS) ×1 IMPLANT
CATH INFINITI 5FR ANG PIGTAIL (CATHETERS) ×1 IMPLANT
CATH INFINITI JR4 5F (CATHETERS) ×1 IMPLANT
CATH OPTITORQUE TIG 4.0 5F (CATHETERS) ×1 IMPLANT
DEVICE RAD COMP TR BAND LRG (VASCULAR PRODUCTS) ×1 IMPLANT
GLIDESHEATH SLEND A-KIT 6F 22G (SHEATH) ×1 IMPLANT
GUIDEWIRE INQWIRE 1.5J.035X260 (WIRE) IMPLANT
INQWIRE 1.5J .035X260CM (WIRE) ×2
KIT HEART LEFT (KITS) ×2 IMPLANT
PACK CARDIAC CATHETERIZATION (CUSTOM PROCEDURE TRAY) ×2 IMPLANT
SYR MEDRAD MARK V 150ML (SYRINGE) ×2 IMPLANT
TRANSDUCER W/STOPCOCK (MISCELLANEOUS) ×2 IMPLANT
TUBING CIL FLEX 10 FLL-RA (TUBING) ×2 IMPLANT
WIRE HI TORQ VERSACORE-J 145CM (WIRE) ×1 IMPLANT

## 2017-09-15 NOTE — H&P (View-Only) (Signed)
Reason for Consult:Severe 2 vessel CAD Referring Physician: Dr. Karie Georges  Karen Tate is an 72 y.o. female.  HPI: 72 yo woman with multiple CRF presents with a cc/o CP  Karen Tate is a 72 yo woman with a past history of hypertension, type 2 diabetes, PE (on Eliquis), hyperlipidemia, and anxiety. Strong family history of CAD. She has been feeling tired and run down for about 6 months. Over the past 2 weeks she has developed exertional "heartburn" with shortness of breath. Relieved with rest. She was started on Imdur and referred to Dr. Bronson Ing. He ordered a [employer]. She was having the study on 09/12/2017. During the stress test she developed heartburn and ECG showed ST depression in anterolateral leads. She was admitted. Troponin was positive at 4.33. Today she underwent cardiac cath which revealed severe 2 vessel CAD.  She is currently pain free.  Past Medical History:  Diagnosis Date  . Anxiety neurosis   . Arthritis   . Dizziness   . GERD (gastroesophageal reflux disease)   . Glaucoma   . Hiatal hernia   . Hyperlipemia   . Hypertension   . Joint pain   . Pulmonary embolus ([place])   . Type 2 diabetes mellitus ([place])     Past Surgical History:  Procedure Laterality Date  . HERNIA REPAIR    . LEFT HEART CATH AND CORONARY ANGIOGRAPHY N/A 09/15/2017   Procedure: LEFT HEART CATH AND CORONARY ANGIOGRAPHY;  Surgeon: Lorretta Harp, MD;  Location: [facility];  Service: Cardiovascular;  Laterality: N/A;  . SHOULDER SURGERY     LEFT  . TOTAL KNEE ARTHROPLASTY     LEFT  . VAGINAL HYSTERECTOMY      Family History  Problem Relation Age of Onset  . Diabetes Father   . Breast cancer Mother     Social History:  reports that she quit smoking about 39 years ago. Her smoking use included cigarettes. She has a 20.00 pack-year smoking history. She has never used smokeless tobacco. She reports that she does not drink alcohol or use drugs.  Allergies:  Allergies   Allergen Reactions  . Beta Adrenergic Blockers Other (See Comments)    Dropped heart rate to the 40's  . Calcium Channel Blockers Other (See Comments)    Dropped HR into the 40's  . Naproxen Hives    Medications:  Prior to Admission:  Medications Prior to Admission  Medication Sig Dispense Refill Last Dose  . ALPRAZolam (XANAX) 0.5 MG tablet Take 0.5 mg by mouth 2 (two) times daily.   09/11/2017 at Unknown time  . amLODipine (NORVASC) 10 MG tablet Take 10 mg by mouth daily.   09/11/2017 at Unknown time  . apixaban (ELIQUIS) 5 MG TABS tablet Take 5 mg by mouth 2 (two) times daily.   09/11/2017 at 2100  . aspirin EC 81 MG tablet Take 81 mg by mouth daily.   09/11/2017 at Unknown time  . benazepril (LOTENSIN) 40 MG tablet Take 40 mg by mouth daily.   09/11/2017 at Unknown time  . Black Cohosh 540 MG CAPS Take 1 capsule by mouth daily.   09/11/2017 at Unknown time  . fish oil-omega-3 fatty acids 1000 MG capsule Take 1 g by mouth 3 (three) times daily.    09/11/2017 at Unknown time  . glipiZIDE (GLUCOTROL) 5 MG tablet Take 5 mg by mouth 2 (two) times daily before a meal.   09/11/2017 at Unknown time  . hydrALAZINE (APRESOLINE) 25 MG  tablet Take 25 mg by mouth 3 (three) times daily.    09/11/2017 at Unknown time  . hydrochlorothiazide (HYDRODIURIL) 25 MG tablet Take 25 mg by mouth daily.   09/11/2017 at Unknown time  . isosorbide mononitrate (IMDUR) 30 MG 24 hr tablet Take 30 mg by mouth daily.   09/11/2017 at Unknown time  . meclizine (ANTIVERT) 25 MG tablet Take 25 mg by mouth 3 (three) times daily as needed for dizziness.   09/11/2017 at Unknown time  . metFORMIN (GLUCOPHAGE) 1000 MG tablet Take 1,000 mg by mouth 2 (two) times daily with a meal.   09/11/2017 at Unknown time  . omeprazole (PRILOSEC) 40 MG capsule Take 40 mg by mouth daily.   09/11/2017 at Unknown time  . potassium chloride SA (K-DUR,KLOR-CON) 20 MEQ tablet Take 20 mEq by mouth daily.   09/11/2017 at Unknown time  . tiZANidine (ZANAFLEX) 4  MG tablet Take 1 tablet (4 mg total) by mouth every 6 (six) hours as needed for muscle spasms. 60 tablet 0 09/11/2017 at Unknown time    Results for orders placed or performed during the hospital encounter of 09/12/17 (from the past 48 hour(s))  Glucose, capillary     Status: Abnormal   Collection Time: 09/13/17  9:13 PM  Result Value Ref Range   Glucose-Capillary 167 (H) 65 - 99 mg/dL  CBC     Status: Abnormal   Collection Time: 09/14/17  7:26 AM  Result Value Ref Range   WBC 5.0 4.0 - 10.5 K/uL   RBC 3.37 (L) 3.87 - 5.11 MIL/uL   Hemoglobin 9.9 (L) 12.0 - 15.0 g/dL   HCT 31.6 (L) 36.0 - 46.0 %   MCV 93.8 78.0 - 100.0 fL   MCH 29.4 26.0 - 34.0 pg   MCHC 31.3 30.0 - 36.0 g/dL   RDW 15.1 11.5 - 15.5 %   Platelets 331 150 - 400 K/uL    Comment: Performed at [facility], [place] [address] [place], [place] [address]  Heparin level (unfractionated)     Status: None   Collection Time: 09/14/17  7:26 AM  Result Value Ref Range   Heparin Unfractionated 0.56 0.30 - 0.70 IU/mL    Comment: (NOTE) If heparin results are below expected values, and patient dosage has  been confirmed, suggest follow up testing of antithrombin III levels. Performed at [facility], [place] [address] [place], [place] [address]   APTT     Status: Abnormal   Collection Time: 09/14/17  7:26 AM  Result Value Ref Range   aPTT 79 (H) 24 - 36 seconds    Comment:        IF BASELINE aPTT IS ELEVATED, SUGGEST PATIENT RISK ASSESSMENT BE USED TO DETERMINE APPROPRIATE ANTICOAGULANT THERAPY. Performed at [facility], [place] [address], [place], [place] [address]   Glucose, capillary     Status: Abnormal   Collection Time: 09/14/17  8:03 AM  Result Value Ref Range   Glucose-Capillary 160 (H) 65 - 99 mg/dL  Glucose, capillary     Status: Abnormal   Collection Time: 09/14/17 11:41 AM  Result Value Ref Range   Glucose-Capillary 173 (H) 65 - 99 mg/dL  Glucose, capillary     Status: Abnormal   Collection  Time: 09/14/17  4:50 PM  Result Value Ref Range   Glucose-Capillary 160 (H) 65 - 99 mg/dL  Glucose, capillary     Status: Abnormal   Collection Time: 09/14/17  9:01 PM  Result Value Ref Range   Glucose-Capillary 132 (H) 65 - 99 mg/dL  Glucose, capillary     Status: Abnormal   Collection Time: 09/15/17  8:38 AM  Result Value Ref Range   Glucose-Capillary 239 (H) 65 - 99 mg/dL  CBC     Status: Abnormal   Collection Time: 09/15/17  9:14 AM  Result Value Ref Range   WBC 6.2 4.0 - 10.5 K/uL   RBC 3.68 (L) 3.87 - 5.11 MIL/uL   Hemoglobin 10.6 (L) 12.0 - 15.0 g/dL   HCT 34.6 (L) 36.0 - 46.0 %   MCV 94.0 78.0 - 100.0 fL   MCH 28.8 26.0 - 34.0 pg   MCHC 30.6 30.0 - 36.0 g/dL   RDW 15.2 11.5 - 15.5 %   Platelets 411 (H) 150 - 400 K/uL    Comment: Performed at [facility], [place] [address] [place], [place] [address]  Glucose, capillary     Status: Abnormal   Collection Time: 09/15/17 11:45 AM  Result Value Ref Range   Glucose-Capillary 197 (H) 65 - 99 mg/dL  Glucose, capillary     Status: Abnormal   Collection Time: 09/15/17  5:02 PM  Result Value Ref Range   Glucose-Capillary 129 (H) 65 - 99 mg/dL    No results found.  Review of Systems  Constitutional: Positive for malaise/fatigue. Negative for chills and fever.  Respiratory: Positive for shortness of breath. Negative for cough and wheezing.   Cardiovascular: Positive for chest pain. Negative for orthopnea, claudication and leg swelling.  Gastrointestinal: Positive for heartburn. Negative for nausea and vomiting.  Genitourinary: Negative for dysuria and urgency.  Musculoskeletal: Positive for joint pain.       Arthritis  Neurological: Positive for sensory change (neuropathy in legs/ feet). Negative for loss of consciousness and weakness.  Endo/Heme/Allergies: Bruises/bleeds easily.  Psychiatric/Behavioral: The patient is nervous/anxious.   All other systems reviewed and are negative.  Blood pressure (!) 148/67, pulse (!)  106, temperature 98.1 F (36.7 C), temperature source Oral, resp. rate 18, height 5\' 5"  (1.651 m), weight 199 lb 4.8 oz (90.4 kg), SpO2 99 %. Physical Exam  Vitals reviewed. Constitutional: She is oriented to person, place, and time. No distress.  obese  HENT:  Head: Normocephalic and atraumatic.  Mouth/Throat: No oropharyngeal exudate.  Eyes: Conjunctivae and EOM are normal. No scleral icterus.  Neck: Neck supple. No thyromegaly present.  No carotid bruits  Cardiovascular: Normal rate, regular rhythm, normal heart sounds and intact distal pulses. Exam reveals no gallop and no friction rub.  No murmur heard. Respiratory: Effort normal and breath sounds normal. No respiratory distress. She has no wheezes. She has no rales.  GI: Soft. She exhibits no distension. There is no tenderness.  Musculoskeletal: She exhibits no edema.  Lymphadenopathy:    She has no cervical adenopathy.  Neurological: She is alert and oriented to person, place, and time. No cranial nerve deficit. She exhibits normal muscle tone. Coordination normal.  Skin: Skin is warm and dry.   CARDIAC CATHETERIZATION Conclusion     Ramus lesion is 99% stenosed.  Mid Cx to Dist Cx lesion is 80% stenosed.  Ost LAD to Prox LAD lesion is 70% stenosed.  Prox LAD lesion is 90% stenosed.  There is moderate to severe left ventricular systolic dysfunction.  LV end diastolic pressure is severely elevated.  The left ventricular ejection fraction is 25-35% by visual estimate.   Karen Tate is a 72 y.o. female  [id number] LOCATION:  FACILITY: [facility]  PHYSICIAN: Quay Burow, M.D.    I personally reviewed the cath images and concur with the findings noted above  Assessment/Plan: 72 year old woman with no prior cardiac history, but with multiple CRF including hypertension, hyperlipidemia, diabetes, and a strong family history presents with a cc/o exertional CP. She developed unstable CP during a Lexiscan myoview and  ruled in for a non-STEMI with a troponin of 4.33. At catheterization she was found to have severe 2 vessel CAD with impaired LV function.  CABG is indicated for survival benefit and relief of symptoms.  I discussed the general nature of the procedure, the need for general anesthesia, the use of cardiopulmonary bypass, and the incisions to be used with Karen Tate and her family. We discussed the expected hospital stay, overall recovery and short and long term outcomes. I informed her of the indications, risks, benefits and alternatives. She understands the risks include, but are not limited to death, stroke, MI, DVT/PE, bleeding, possible need for transfusion, infections, cardiac arrhythmias, as well as other organ system dysfunction including respiratory, renal, or GI complications.   She accepts the risks and agrees to proceed.  For CABG on Thursday 09/18/2017  Hx PE- on Eliquis- currently on hold- will resume prior to DC postop   [name] 09/15/2017, 6:07 PM

## 2017-09-15 NOTE — Consult Note (Signed)
[facility] Primary Care Navigator  09/15/2017  Karen Tate [birth year] [id number]   Met with patientand family at the bedside to identify possible discharge needs. Patient reports having "positive stress test result" that had to this admission and eventually surgery per daughter.  [facility].   Patient shared [facility] (prescription meds) to obtain medications without any problem.  Patientverbalized managing herownmedications at [employer] of use "pill box" system filled every week.  Patientreports that she was driving prior to admission but husband [name]) willbe able toprovide transportation to herdoctors'appointmentsafter discharge.  Daughter states that patient's husband will be the primary caregiver at home and daughters [name] and [name]) will also be providing assistance with her care needs .   Disposition for discharge is pending possible surgical intervention according to daughter.   Patient voiced understanding to call primary care provider's office whenshe returns home, for a post discharge follow-up appointment within 1- 2 weeks or sooner if needed.Patient letter (with PCP's contact number) was provided as a reminder.  Explained to patient about [facility] CM services available for healthmanagement at home and patient had voiced interest about it. She expressed understanding to seekreferral to [facility] care managementfrom primary care providerif deemed necessary andappropriate foranyservices in thenearfuture- once discharged to home.  [facility] care management information provided for future needs that she may have.   Primary care provider's office is listed as providing transition of care (TOC) follow-up.    For additional questions please contact:  Edwena Felty A. Jemia Fata, BSN, RN-BC [facility] PRIMARY  CARE Navigator Cell: [phone]

## 2017-09-15 NOTE — Interval H&P Note (Signed)
Cath Lab Visit (complete for each Cath Lab visit)  Clinical Evaluation Leading to the Procedure:   ACS: Yes.    Non-ACS:    Anginal Classification: CCS III  Anti-ischemic medical therapy: No Therapy  Non-Invasive Test Results: No non-invasive testing performed  Prior CABG: No previous CABG      History and Physical Interval Note:  09/15/2017 7:41 AM  Karen Tate  has presented today for surgery, with the diagnosis of postive stress  The various methods of treatment have been discussed with the patient and family. After consideration of risks, benefits and other options for treatment, the patient has consented to  Procedure(s): LEFT HEART CATH AND CORONARY ANGIOGRAPHY (N/A) as a surgical intervention .  The patient's history has been reviewed, patient examined, no change in status, stable for surgery.  I have reviewed the patient's chart and labs.  Questions were answered to the patient's satisfaction.     Quay Burow

## 2017-09-15 NOTE — Consult Note (Signed)
Reason for Consult:Severe 2 vessel CAD Referring Physician: Dr. Karie Georges  Karen Tate is an 72 y.o. female.  HPI: 72 yo woman with multiple CRF presents with a cc/o CP  Karen Tate is a 72 yo woman with a past history of hypertension, type 2 diabetes, PE (on Eliquis), hyperlipidemia, and anxiety. Strong family history of CAD. She has been feeling tired and run down for about 6 months. Over the past 2 weeks she has developed exertional "heartburn" with shortness of breath. Relieved with rest. She was started on Imdur and referred to Dr. Bronson Ing. He ordered a [employer]. She was having the study on 09/12/2017. During the stress test she developed heartburn and ECG showed ST depression in anterolateral leads. She was admitted. Troponin was positive at 4.33. Today she underwent cardiac cath which revealed severe 2 vessel CAD.  She is currently pain free.  Past Medical History:  Diagnosis Date  . Anxiety neurosis   . Arthritis   . Dizziness   . GERD (gastroesophageal reflux disease)   . Glaucoma   . Hiatal hernia   . Hyperlipemia   . Hypertension   . Joint pain   . Pulmonary embolus ([place])   . Type 2 diabetes mellitus ([place])     Past Surgical History:  Procedure Laterality Date  . HERNIA REPAIR    . LEFT HEART CATH AND CORONARY ANGIOGRAPHY N/A 09/15/2017   Procedure: LEFT HEART CATH AND CORONARY ANGIOGRAPHY;  Surgeon: Lorretta Harp, MD;  Location: [facility];  Service: Cardiovascular;  Laterality: N/A;  . SHOULDER SURGERY     LEFT  . TOTAL KNEE ARTHROPLASTY     LEFT  . VAGINAL HYSTERECTOMY      Family History  Problem Relation Age of Onset  . Diabetes Father   . Breast cancer Mother     Social History:  reports that she quit smoking about 39 years ago. Her smoking use included cigarettes. She has a 20.00 pack-year smoking history. She has never used smokeless tobacco. She reports that she does not drink alcohol or use drugs.  Allergies:  Allergies   Allergen Reactions  . Beta Adrenergic Blockers Other (See Comments)    Dropped heart rate to the 40's  . Calcium Channel Blockers Other (See Comments)    Dropped HR into the 40's  . Naproxen Hives    Medications:  Prior to Admission:  Medications Prior to Admission  Medication Sig Dispense Refill Last Dose  . ALPRAZolam (XANAX) 0.5 MG tablet Take 0.5 mg by mouth 2 (two) times daily.   09/11/2017 at Unknown time  . amLODipine (NORVASC) 10 MG tablet Take 10 mg by mouth daily.   09/11/2017 at Unknown time  . apixaban (ELIQUIS) 5 MG TABS tablet Take 5 mg by mouth 2 (two) times daily.   09/11/2017 at 2100  . aspirin EC 81 MG tablet Take 81 mg by mouth daily.   09/11/2017 at Unknown time  . benazepril (LOTENSIN) 40 MG tablet Take 40 mg by mouth daily.   09/11/2017 at Unknown time  . Black Cohosh 540 MG CAPS Take 1 capsule by mouth daily.   09/11/2017 at Unknown time  . fish oil-omega-3 fatty acids 1000 MG capsule Take 1 g by mouth 3 (three) times daily.    09/11/2017 at Unknown time  . glipiZIDE (GLUCOTROL) 5 MG tablet Take 5 mg by mouth 2 (two) times daily before a meal.   09/11/2017 at Unknown time  . hydrALAZINE (APRESOLINE) 25 MG  tablet Take 25 mg by mouth 3 (three) times daily.    09/11/2017 at Unknown time  . hydrochlorothiazide (HYDRODIURIL) 25 MG tablet Take 25 mg by mouth daily.   09/11/2017 at Unknown time  . isosorbide mononitrate (IMDUR) 30 MG 24 hr tablet Take 30 mg by mouth daily.   09/11/2017 at Unknown time  . meclizine (ANTIVERT) 25 MG tablet Take 25 mg by mouth 3 (three) times daily as needed for dizziness.   09/11/2017 at Unknown time  . metFORMIN (GLUCOPHAGE) 1000 MG tablet Take 1,000 mg by mouth 2 (two) times daily with a meal.   09/11/2017 at Unknown time  . omeprazole (PRILOSEC) 40 MG capsule Take 40 mg by mouth daily.   09/11/2017 at Unknown time  . potassium chloride SA (K-DUR,KLOR-CON) 20 MEQ tablet Take 20 mEq by mouth daily.   09/11/2017 at Unknown time  . tiZANidine (ZANAFLEX) 4  MG tablet Take 1 tablet (4 mg total) by mouth every 6 (six) hours as needed for muscle spasms. 60 tablet 0 09/11/2017 at Unknown time    Results for orders placed or performed during the hospital encounter of 09/12/17 (from the past 48 hour(s))  Glucose, capillary     Status: Abnormal   Collection Time: 09/13/17  9:13 PM  Result Value Ref Range   Glucose-Capillary 167 (H) 65 - 99 mg/dL  CBC     Status: Abnormal   Collection Time: 09/14/17  7:26 AM  Result Value Ref Range   WBC 5.0 4.0 - 10.5 K/uL   RBC 3.37 (L) 3.87 - 5.11 MIL/uL   Hemoglobin 9.9 (L) 12.0 - 15.0 g/dL   HCT 31.6 (L) 36.0 - 46.0 %   MCV 93.8 78.0 - 100.0 fL   MCH 29.4 26.0 - 34.0 pg   MCHC 31.3 30.0 - 36.0 g/dL   RDW 15.1 11.5 - 15.5 %   Platelets 331 150 - 400 K/uL    Comment: Performed at [facility], [place] [address] [place], [place] [address]  Heparin level (unfractionated)     Status: None   Collection Time: 09/14/17  7:26 AM  Result Value Ref Range   Heparin Unfractionated 0.56 0.30 - 0.70 IU/mL    Comment: (NOTE) If heparin results are below expected values, and patient dosage has  been confirmed, suggest follow up testing of antithrombin III levels. Performed at [facility], [place] [address], [place], [place] [address]   APTT     Status: Abnormal   Collection Time: 09/14/17  7:26 AM  Result Value Ref Range   aPTT 79 (H) 24 - 36 seconds    Comment:        IF BASELINE aPTT IS ELEVATED, SUGGEST PATIENT RISK ASSESSMENT BE USED TO DETERMINE APPROPRIATE ANTICOAGULANT THERAPY. Performed at [facility], [place] [address], [place], [place] [address]   Glucose, capillary     Status: Abnormal   Collection Time: 09/14/17  8:03 AM  Result Value Ref Range   Glucose-Capillary 160 (H) 65 - 99 mg/dL  Glucose, capillary     Status: Abnormal   Collection Time: 09/14/17 11:41 AM  Result Value Ref Range   Glucose-Capillary 173 (H) 65 - 99 mg/dL  Glucose, capillary     Status: Abnormal   Collection  Time: 09/14/17  4:50 PM  Result Value Ref Range   Glucose-Capillary 160 (H) 65 - 99 mg/dL  Glucose, capillary     Status: Abnormal   Collection Time: 09/14/17  9:01 PM  Result Value Ref Range   Glucose-Capillary 132 (H) 65 - 99 mg/dL  Glucose, capillary     Status: Abnormal   Collection Time: 09/15/17  8:38 AM  Result Value Ref Range   Glucose-Capillary 239 (H) 65 - 99 mg/dL  CBC     Status: Abnormal   Collection Time: 09/15/17  9:14 AM  Result Value Ref Range   WBC 6.2 4.0 - 10.5 K/uL   RBC 3.68 (L) 3.87 - 5.11 MIL/uL   Hemoglobin 10.6 (L) 12.0 - 15.0 g/dL   HCT 34.6 (L) 36.0 - 46.0 %   MCV 94.0 78.0 - 100.0 fL   MCH 28.8 26.0 - 34.0 pg   MCHC 30.6 30.0 - 36.0 g/dL   RDW 15.2 11.5 - 15.5 %   Platelets 411 (H) 150 - 400 K/uL    Comment: Performed at [facility], [place] [address], [place], [place] [address]  Glucose, capillary     Status: Abnormal   Collection Time: 09/15/17 11:45 AM  Result Value Ref Range   Glucose-Capillary 197 (H) 65 - 99 mg/dL  Glucose, capillary     Status: Abnormal   Collection Time: 09/15/17  5:02 PM  Result Value Ref Range   Glucose-Capillary 129 (H) 65 - 99 mg/dL    No results found.  Review of Systems  Constitutional: Positive for malaise/fatigue. Negative for chills and fever.  Respiratory: Positive for shortness of breath. Negative for cough and wheezing.   Cardiovascular: Positive for chest pain. Negative for orthopnea, claudication and leg swelling.  Gastrointestinal: Positive for heartburn. Negative for nausea and vomiting.  Genitourinary: Negative for dysuria and urgency.  Musculoskeletal: Positive for joint pain.       Arthritis  Neurological: Positive for sensory change (neuropathy in legs/ feet). Negative for loss of consciousness and weakness.  Endo/Heme/Allergies: Bruises/bleeds easily.  Psychiatric/Behavioral: The patient is nervous/anxious.   All other systems reviewed and are negative.  Blood pressure (!) 148/67, pulse (!)  106, temperature 98.1 F (36.7 C), temperature source Oral, resp. rate 18, height 5\' 5"  (1.651 m), weight 199 lb 4.8 oz (90.4 kg), SpO2 99 %. Physical Exam  Vitals reviewed. Constitutional: She is oriented to person, place, and time. No distress.  obese  HENT:  Head: Normocephalic and atraumatic.  Mouth/Throat: No oropharyngeal exudate.  Eyes: Conjunctivae and EOM are normal. No scleral icterus.  Neck: Neck supple. No thyromegaly present.  No carotid bruits  Cardiovascular: Normal rate, regular rhythm, normal heart sounds and intact distal pulses. Exam reveals no gallop and no friction rub.  No murmur heard. Respiratory: Effort normal and breath sounds normal. No respiratory distress. She has no wheezes. She has no rales.  GI: Soft. She exhibits no distension. There is no tenderness.  Musculoskeletal: She exhibits no edema.  Lymphadenopathy:    She has no cervical adenopathy.  Neurological: She is alert and oriented to person, place, and time. No cranial nerve deficit. She exhibits normal muscle tone. Coordination normal.  Skin: Skin is warm and dry.   CARDIAC CATHETERIZATION Conclusion     Ramus lesion is 99% stenosed.  Mid Cx to Dist Cx lesion is 80% stenosed.  Ost LAD to Prox LAD lesion is 70% stenosed.  Prox LAD lesion is 90% stenosed.  There is moderate to severe left ventricular systolic dysfunction.  LV end diastolic pressure is severely elevated.  The left ventricular ejection fraction is 25-35% by visual estimate.   Karen Tate is a 72 y.o. female  [id number] LOCATION:  FACILITY: [facility]  PHYSICIAN: Quay Burow, M.D.    I personally reviewed the cath images and concur with the findings noted above  Assessment/Plan: 72 year old woman with no prior cardiac history, but with multiple CRF including hypertension, hyperlipidemia, diabetes, and a strong family history presents with a cc/o exertional CP. She developed unstable CP during a Lexiscan myoview and  ruled in for a non-STEMI with a troponin of 4.33. At catheterization she was found to have severe 2 vessel CAD with impaired LV function.  CABG is indicated for survival benefit and relief of symptoms.  I discussed the general nature of the procedure, the need for general anesthesia, the use of cardiopulmonary bypass, and the incisions to be used with Mrs. Trinidad and her family. We discussed the expected hospital stay, overall recovery and short and long term outcomes. I informed her of the indications, risks, benefits and alternatives. She understands the risks include, but are not limited to death, stroke, MI, DVT/PE, bleeding, possible need for transfusion, infections, cardiac arrhythmias, as well as other organ system dysfunction including respiratory, renal, or GI complications.   She accepts the risks and agrees to proceed.  For CABG on Thursday 09/18/2017  Hx PE- on Eliquis- currently on hold- will resume prior to DC postop   [name] 09/15/2017, 6:07 PM

## 2017-09-15 NOTE — Care Management Important Message (Signed)
Important Message  Patient Details  Name: Karen Tate MRN: [id number] Date of Birth: 11/21/1945   Medicare Important Message Given:  Yes    Arita Severtson P [place] 09/15/2017, 3:21 PM

## 2017-09-16 ENCOUNTER — Inpatient Hospital Stay (HOSPITAL_COMMUNITY): Payer: Medicare Other

## 2017-09-16 ENCOUNTER — Ambulatory Visit: Payer: Medicare Other | Admitting: Cardiovascular Disease

## 2017-09-16 LAB — PULMONARY FUNCTION TEST
DL/VA % pred: 121 %
DL/VA: 6 ml/min/mmHg/L
DLCO cor % pred: 62 %
DLCO cor: 15.93 ml/min/mmHg
DLCO unc % pred: 54 %
DLCO unc: 14.04 ml/min/mmHg
FEF 25-75 Post: 1.96 L/sec
FEF 25-75 Pre: 1.35 L/sec
FEF2575-%Change-Post: 45 %
FEF2575-%Pred-Post: 120 %
FEF2575-%Pred-Pre: 82 %
FEV1-%Change-Post: 7 %
FEV1-%Pred-Post: 79 %
FEV1-%Pred-Pre: 73 %
FEV1-Post: 1.45 L
FEV1-Pre: 1.35 L
FEV1FVC-%Change-Post: 6 %
FEV1FVC-%Pred-Pre: 105 %
FEV6-%Change-Post: 0 %
FEV6-%Pred-Post: 73 %
FEV6-%Pred-Pre: 73 %
FEV6-Post: 1.68 L
FEV6-Pre: 1.66 L
FEV6FVC-%Pred-Post: 104 %
FEV6FVC-%Pred-Pre: 104 %
FVC-%Change-Post: 1 %
FVC-%Pred-Post: 71 %
FVC-%Pred-Pre: 70 %
FVC-Post: 1.68 L
FVC-Pre: 1.66 L
Post FEV1/FVC ratio: 86 %
Post FEV6/FVC ratio: 100 %
Pre FEV1/FVC ratio: 81 %
Pre FEV6/FVC Ratio: 100 %
RV % pred: 347 %
RV: 8.02 L
TLC % pred: 188 %
TLC: 9.83 L

## 2017-09-16 LAB — CBC
HCT: 32.8 % — ABNORMAL LOW (ref 36.0–46.0)
Hemoglobin: 10.1 g/dL — ABNORMAL LOW (ref 12.0–15.0)
MCH: 29.2 pg (ref 26.0–34.0)
MCHC: 30.8 g/dL (ref 30.0–36.0)
MCV: 94.8 fL (ref 78.0–100.0)
Platelets: 354 10*3/uL (ref 150–400)
RBC: 3.46 MIL/uL — ABNORMAL LOW (ref 3.87–5.11)
RDW: 15.8 % — ABNORMAL HIGH (ref 11.5–15.5)
WBC: 6.4 10*3/uL (ref 4.0–10.5)

## 2017-09-16 LAB — GLUCOSE, CAPILLARY
Glucose-Capillary: 180 mg/dL — ABNORMAL HIGH (ref 70–99)
Glucose-Capillary: 210 mg/dL — ABNORMAL HIGH (ref 70–99)
Glucose-Capillary: 148 mg/dL — ABNORMAL HIGH (ref 70–99)
Glucose-Capillary: 154 mg/dL — ABNORMAL HIGH (ref 70–99)

## 2017-09-16 LAB — BASIC METABOLIC PANEL
Anion gap: 10 (ref 5–15)
BUN: 14 mg/dL (ref 8–23)
CO2: 22 mmol/L (ref 22–32)
Calcium: 9.3 mg/dL (ref 8.9–10.3)
Chloride: 107 mmol/L (ref 98–111)
Creatinine, Ser: 1.13 mg/dL — ABNORMAL HIGH (ref 0.44–1.00)
GFR calc Af Amer: 54 mL/min — ABNORMAL LOW (ref >60)
GFR calc non Af Amer: 47 mL/min — ABNORMAL LOW (ref >60)
Glucose, Bld: 169 mg/dL — ABNORMAL HIGH (ref 70–99)
Potassium: 4.4 mmol/L (ref 3.5–5.1)
Sodium: 139 mmol/L (ref 135–145)

## 2017-09-16 MED ORDER — ALBUTEROL SULFATE (2.5 MG/3ML) 0.083% IN NEBU
2.5000 mg | INHALATION_SOLUTION | Freq: Once | RESPIRATORY_TRACT | Status: AC
  Administered 2017-09-16: 2.5 mg via RESPIRATORY_TRACT

## 2017-09-16 MED ORDER — WHITE PETROLATUM EX OINT
TOPICAL_OINTMENT | CUTANEOUS | Status: AC
  Administered 2017-09-16: 12:00:00
  Filled 2017-09-16: qty 28.35

## 2017-09-16 NOTE — Progress Notes (Signed)
CARDIAC REHAB PHASE I   PRE:  Rate/Rhythm: 106 ST  BP:  Supine:   Sitting: 137/72  Standing:    SaO2: 100%RA  MODE:  Ambulation: 80 ft   POST:  Rate/Rhythm: 123 ST  BP:  Supine:   Sitting: 154/84  Standing:    SaO2: 98%RA 1100-1139 Pt walked 80 ft on RA with rolling walker and no CP. Slow pace and stopped a couple of times to rest right arm. Pt normally uses cane. Discussed importance of IS and mobility with pt and daughter. Pt demonstrated 1500 ml correctly. Gave In the tube handout and discussed sternal precautions. Gave OHS booklet, care guide and wrote down how to view pre op video. Pt stated she will have someone to stay with her 24/7 after discharge.   Graylon Good, RN BSN  09/16/2017 11:34 AM

## 2017-09-16 NOTE — Progress Notes (Signed)
1 Day Post-Op Procedure(s) (LRB): LEFT HEART CATH AND CORONARY ANGIOGRAPHY (N/A) Subjective: No complaints this AM Denies Cp Much better spirits, less anxious  Objective: Vital signs in last 24 hours: Temp:  [98.7 F (37.1 C)] 98.7 F (37.1 C) (06/25 0639) Pulse Rate:  [93] 93 (06/25 0639) Cardiac Rhythm: Normal sinus rhythm (06/25 0743) Resp:  [18] 18 (06/25 0639) BP: (116-163)/(57-70) 163/70 (06/25 0639) SpO2:  [97 %] 97 % (06/25 0639) Weight:  [197 lb 4.8 oz (89.5 kg)] 197 lb 4.8 oz (89.5 kg) (06/25 0639)  Hemodynamic parameters for last 24 hours:    Intake/Output from previous day: 06/24 0701 - 06/25 0700 In: 1963 [P.O.:960; I.V.:1003] Out: 4600 [Urine:4600] Intake/Output this shift: No intake/output data recorded.  General appearance: alert, cooperative and no distress Neurologic: intact Heart: regular rate and rhythm Lungs: clear to auscultation bilaterally  Lab Results: Recent Labs    09/15/17 0914 09/16/17 0609  WBC 6.2 6.4  HGB 10.6* 10.1*  HCT 34.6* 32.8*  PLT 411* 354   BMET:  Recent Labs    09/16/17 0609  NA 139  K 4.4  CL 107  CO2 22  GLUCOSE 169*  BUN 14  CREATININE 1.13*  CALCIUM 9.3    PT/INR: No results for input(s): LABPROT, INR in the last 72 hours. 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

## 2017-09-16 NOTE — Plan of Care (Signed)
°  Problem: Coping: °Goal: Level of anxiety will decrease °Outcome: Progressing °  °

## 2017-09-16 NOTE — Progress Notes (Signed)
Subjective:  Getting pre CABG PFTls   Objective:  Vital Signs in the last 24 hours: BP (!) 163/70 (BP Location: Left Arm)   Pulse 93   Temp 98.7 F (37.1 C) (Oral)   Resp 18   Ht 5\' 5"  (1.651 m)   Wt 197 lb 4.8 oz (89.5 kg)   SpO2 97%   BMI 32.83 kg/m   Physical Exam: Obese black female currently in no acute distress Lungs:  Clear Cardiac:  Regular rhythm, normal S1 and S2, no S3 Extremities:  No edema present  Intake/Output from previous day: 06/24 0701 - 06/25 0700 In: 1963 [P.O.:960; I.V.:1003] Out: 4600 [Urine:4600]  Weight Filed Weights   09/14/17 0514 09/15/17 0600 09/16/17 0639  Weight: 199 lb 3.2 oz (90.4 kg) 199 lb 4.8 oz (90.4 kg) 197 lb 4.8 oz (89.5 kg)    Lab Results: Basic Metabolic Panel: Recent Labs    09/16/17 0609  NA 139  K 4.4  CL 107  CO2 22  GLUCOSE 169*  BUN 14  CREATININE 1.13*   CBC: Recent Labs    09/15/17 0914 09/16/17 0609  WBC 6.2 6.4  HGB 10.6* 10.1*  HCT 34.6* 32.8*  MCV 94.0 94.8  PLT 411* 354   Cardiac Enzymes: Troponin (Point of Care Test) No results for input(s): TROPIPOC in the last 72 hours. Cardiac Panel (last 3 results) No results for input(s): CKTOTAL, CKMB, TROPONINI, RELINDX in the last 72 hours.  Telemetry: Sinus rhythm 09/16/2017   . albuterol  2.5 mg Nebulization Once  . ALPRAZolam  0.5 mg Oral BID  . amLODipine  10 mg Oral Daily  . aspirin EC  81 mg Oral Daily  . atorvastatin  80 mg Oral q1800  . benazepril  40 mg Oral Daily  . glipiZIDE  5 mg Oral BID AC  . hydrALAZINE  25 mg Oral TID  . insulin aspart  0-15 Units Subcutaneous TID WC  . insulin aspart  0-5 Units Subcutaneous QHS  . isosorbide mononitrate  30 mg Oral Daily  . omega-3 acid ethyl esters  1 g Oral TID  . pantoprazole  40 mg Oral Daily  . potassium chloride SA  20 mEq Oral Daily  . sodium chloride flush  3 mL Intravenous Q12H  . sodium chloride flush  3 mL Intravenous Q12H   Assessment/Plan:  1.  Non-STEMI with abnormal  EKG 2.  History of pulmonary emboli 3.  Previous chronic anticoagulation 4.  Obesity  Recommendations:  CAD:  Cath 09/15/17 with severe 3 VD including 99% ramus, 80% mid/distal circumflex and 90% proximal LAD.  EF 25-30%  Seen by Dr Roxan Hockey For CABG ischemic DCM on Thursday.   Jenkins Rouge

## 2017-09-17 ENCOUNTER — Inpatient Hospital Stay (HOSPITAL_COMMUNITY): Payer: Medicare Other

## 2017-09-17 ENCOUNTER — Encounter (HOSPITAL_COMMUNITY): Payer: Self-pay | Admitting: Anesthesiology

## 2017-09-17 DIAGNOSIS — Z01810 Encounter for preprocedural cardiovascular examination: Secondary | ICD-10-CM

## 2017-09-17 LAB — SURGICAL PCR SCREEN
MRSA, PCR: NEGATIVE
Staphylococcus aureus: NEGATIVE

## 2017-09-17 LAB — CBC
HCT: 32.2 % — ABNORMAL LOW (ref 36.0–46.0)
Hemoglobin: 9.9 g/dL — ABNORMAL LOW (ref 12.0–15.0)
MCH: 29.2 pg (ref 26.0–34.0)
MCHC: 30.7 g/dL (ref 30.0–36.0)
MCV: 95 fL (ref 78.0–100.0)
Platelets: 323 10*3/uL (ref 150–400)
RBC: 3.39 MIL/uL — ABNORMAL LOW (ref 3.87–5.11)
RDW: 15.8 % — ABNORMAL HIGH (ref 11.5–15.5)
WBC: 5.4 10*3/uL (ref 4.0–10.5)

## 2017-09-17 LAB — URINALYSIS, ROUTINE W REFLEX MICROSCOPIC
Bacteria, UA: NONE SEEN
Bilirubin Urine: NEGATIVE
Glucose, UA: NEGATIVE mg/dL
Hgb urine dipstick: NEGATIVE
Ketones, ur: NEGATIVE mg/dL
Nitrite: NEGATIVE
Protein, ur: NEGATIVE mg/dL
Specific Gravity, Urine: 1.004 — ABNORMAL LOW (ref 1.005–1.030)
pH: 6 (ref 5.0–8.0)

## 2017-09-17 LAB — PROTIME-INR
INR: 0.95
Prothrombin Time: 12.6 seconds (ref 11.4–15.2)

## 2017-09-17 LAB — GLUCOSE, CAPILLARY
Glucose-Capillary: 188 mg/dL — ABNORMAL HIGH (ref 70–99)
Glucose-Capillary: 196 mg/dL — ABNORMAL HIGH (ref 70–99)
Glucose-Capillary: 103 mg/dL — ABNORMAL HIGH (ref 70–99)
Glucose-Capillary: 179 mg/dL — ABNORMAL HIGH (ref 70–99)

## 2017-09-17 MED ORDER — INSULIN REGULAR HUMAN 100 UNIT/ML IJ SOLN
INTRAMUSCULAR | Status: AC
  Administered 2017-09-18: 3.4 [IU]/h via INTRAVENOUS
  Filled 2017-09-17: qty 1

## 2017-09-17 MED ORDER — PLASMA-LYTE 148 IV SOLN
INTRAVENOUS | Status: AC
  Administered 2017-09-18: 08:00:00
  Filled 2017-09-17: qty 2.5

## 2017-09-17 MED ORDER — BISACODYL 5 MG PO TBEC
5.0000 mg | DELAYED_RELEASE_TABLET | Freq: Once | ORAL | Status: AC
  Administered 2017-09-17: 5 mg via ORAL
  Filled 2017-09-17: qty 1

## 2017-09-17 MED ORDER — DEXMEDETOMIDINE HCL IN NACL 400 MCG/100ML IV SOLN
0.1000 ug/kg/h | INTRAVENOUS | Status: AC
  Administered 2017-09-18: 0.7 ug/kg/h via INTRAVENOUS
  Filled 2017-09-17: qty 100

## 2017-09-17 MED ORDER — POTASSIUM CHLORIDE 2 MEQ/ML IV SOLN
80.0000 meq | INTRAVENOUS | Status: DC
  Filled 2017-09-17: qty 40

## 2017-09-17 MED ORDER — TRANEXAMIC ACID (OHS) BOLUS VIA INFUSION
15.0000 mg/kg | INTRAVENOUS | Status: AC
  Administered 2017-09-18: 1341 mg via INTRAVENOUS
  Filled 2017-09-17: qty 1341

## 2017-09-17 MED ORDER — SODIUM CHLORIDE 0.9 % IV SOLN
30.0000 ug/min | INTRAVENOUS | Status: AC
  Administered 2017-09-18: 20 ug/min via INTRAVENOUS
  Filled 2017-09-17: qty 2

## 2017-09-17 MED ORDER — SODIUM CHLORIDE 0.9 % IV SOLN
1.5000 g | INTRAVENOUS | Status: AC
  Administered 2017-09-18: 1.5 g via INTRAVENOUS
  Filled 2017-09-17: qty 1.5

## 2017-09-17 MED ORDER — TRANEXAMIC ACID (OHS) PUMP PRIME SOLUTION
2.0000 mg/kg | INTRAVENOUS | Status: DC
  Filled 2017-09-17: qty 1.79

## 2017-09-17 MED ORDER — CHLORHEXIDINE GLUCONATE CLOTH 2 % EX PADS
6.0000 | MEDICATED_PAD | Freq: Once | CUTANEOUS | Status: AC
  Administered 2017-09-18: 6 via TOPICAL

## 2017-09-17 MED ORDER — DIAZEPAM 2 MG PO TABS
2.0000 mg | ORAL_TABLET | Freq: Once | ORAL | Status: AC
  Administered 2017-09-18: 2 mg via ORAL
  Filled 2017-09-17: qty 1

## 2017-09-17 MED ORDER — TEMAZEPAM 15 MG PO CAPS: 15.0000 mg | ORAL_CAPSULE | Freq: Once | ORAL | Status: DC | PRN

## 2017-09-17 MED ORDER — CHLORHEXIDINE GLUCONATE CLOTH 2 % EX PADS
6.0000 | MEDICATED_PAD | Freq: Once | CUTANEOUS | Status: AC
  Administered 2017-09-17: 6 via TOPICAL

## 2017-09-17 MED ORDER — MILRINONE LACTATE IN DEXTROSE 20-5 MG/100ML-% IV SOLN
0.1250 ug/kg/min | INTRAVENOUS | Status: DC
  Filled 2017-09-17: qty 100

## 2017-09-17 MED ORDER — VANCOMYCIN HCL 10 G IV SOLR
1500.0000 mg | INTRAVENOUS | Status: AC
  Administered 2017-09-18: 1500 mg via INTRAVENOUS
  Filled 2017-09-17: qty 1500

## 2017-09-17 MED ORDER — MAGNESIUM SULFATE 50 % IJ SOLN
40.0000 meq | INTRAMUSCULAR | Status: DC
  Filled 2017-09-17: qty 9.85

## 2017-09-17 MED ORDER — CHLORHEXIDINE GLUCONATE 0.12 % MT SOLN
15.0000 mL | Freq: Once | OROMUCOSAL | Status: AC
  Administered 2017-09-18: 15 mL via OROMUCOSAL
  Filled 2017-09-17: qty 15

## 2017-09-17 MED ORDER — TRANEXAMIC ACID 1000 MG/10ML IV SOLN
1.5000 mg/kg/h | INTRAVENOUS | Status: AC
  Administered 2017-09-18: 1.5 mg/kg/h via INTRAVENOUS
  Filled 2017-09-17: qty 25

## 2017-09-17 MED ORDER — DOPAMINE-DEXTROSE 3.2-5 MG/ML-% IV SOLN
0.0000 ug/kg/min | INTRAVENOUS | Status: AC
  Administered 2017-09-18: 3 ug/kg/min via INTRAVENOUS
  Filled 2017-09-17: qty 250

## 2017-09-17 MED ORDER — EPINEPHRINE PF 1 MG/ML IJ SOLN
0.0000 ug/min | INTRAVENOUS | Status: DC
  Filled 2017-09-17: qty 4

## 2017-09-17 MED ORDER — SODIUM CHLORIDE 0.9 % IV SOLN
750.0000 mg | INTRAVENOUS | Status: DC
  Filled 2017-09-17: qty 750

## 2017-09-17 MED ORDER — SODIUM CHLORIDE 0.9 % IV SOLN
INTRAVENOUS | Status: DC
  Filled 2017-09-17: qty 30

## 2017-09-17 MED ORDER — NITROGLYCERIN IN D5W 200-5 MCG/ML-% IV SOLN
2.0000 ug/min | INTRAVENOUS | Status: AC
  Administered 2017-09-18: 5 ug/min via INTRAVENOUS
  Filled 2017-09-17: qty 250

## 2017-09-17 NOTE — Progress Notes (Signed)
Vascular prelim:  Right Carotid:Velocities in the right ICA are consistent with a 1-39% stenosis.  Left Carotid: Velocities in the left ICA are consistent with a 40-59% stenosis.    Vertebrals: Bilateral vertebral arteries demonstrate antegrade flow.    Right ABI: Resting right ankle-brachial index indicates mild right lower extremity arterial disease.  Left ABI: Resting left ankle-brachial index indicates mild left lower extremity arterial disease.    Right Upper Extremity: Radial and Ulnar Doppler waveforms decrease >50% with compression.  Left Upper Extremity: Radial Doppler waveforms decrease >50% with compression. Ulnar Doppler waveforms remain within normal limits with compression.  Landry Mellow, RDMS, RVT

## 2017-09-17 NOTE — Progress Notes (Signed)
Subjective:  HR up a bit after showering this am no chest pain   Objective:  Vital Signs in the last 24 hours: BP (!) 111/53   Pulse 79   Temp 98.3 F (36.8 C) (Oral)   Resp 17   Ht 5\' 5"  (1.651 m)   Wt 197 lb 4.8 oz (89.5 kg)   SpO2 99%   BMI 32.83 kg/m   Physical Exam: Affect appropriate Overweight black female  HEENT: normal Neck supple with no adenopathy JVP normal no bruits no thyromegaly Lungs clear with no wheezing and good diaphragmatic motion Heart:  S1/S2 no murmur, no rub, gallop or click PMI normal Abdomen: benighn, BS positve, no tenderness, no AAA no bruit.  No HSM or HJR Distal pulses intact with no bruits No edema Neuro non-focal Skin warm and dry No muscular weakness   Intake/Output from previous day: 06/25 0701 - 06/26 0700 In: 243 [P.O.:240; I.V.:3] Out: -   Weight Filed Weights   09/14/17 0514 09/15/17 0600 09/16/17 0639  Weight: 199 lb 3.2 oz (90.4 kg) 199 lb 4.8 oz (90.4 kg) 197 lb 4.8 oz (89.5 kg)    Lab Results: Basic Metabolic Panel: Recent Labs    09/16/17 0609  NA 139  K 4.4  CL 107  CO2 22  GLUCOSE 169*  BUN 14  CREATININE 1.13*   CBC: Recent Labs    09/16/17 0609 09/17/17 0558  WBC 6.4 5.4  HGB 10.1* 9.9*  HCT 32.8* 32.2*  MCV 94.8 95.0  PLT 354 323   Cardiac Enzymes: Troponin (Point of Care Test) No results for input(s): TROPIPOC in the last 72 hours. Cardiac Panel (last 3 results) No results for input(s): CKTOTAL, CKMB, TROPONINI, RELINDX in the last 72 hours.  Telemetry: Sinus rhythm 09/17/2017   . ALPRAZolam  0.5 mg Oral BID  . amLODipine  10 mg Oral Daily  . aspirin EC  81 mg Oral Daily  . atorvastatin  80 mg Oral q1800  . benazepril  40 mg Oral Daily  . glipiZIDE  5 mg Oral BID AC  . hydrALAZINE  25 mg Oral TID  . insulin aspart  0-15 Units Subcutaneous TID WC  . insulin aspart  0-5 Units Subcutaneous QHS  . isosorbide mononitrate  30 mg Oral Daily  . omega-3 acid ethyl esters  1 g Oral TID  .  pantoprazole  40 mg Oral Daily  . potassium chloride SA  20 mEq Oral Daily  . sodium chloride flush  3 mL Intravenous Q12H  . sodium chloride flush  3 mL Intravenous Q12H   Assessment/Plan:  1.  Non-STEMI with abnormal EKG 2.  History of pulmonary emboli 3.  Previous chronic anticoagulation 4.  Obesity  Recommendations:  CAD:  Cath 09/15/17 with severe 3 VD including 99% ramus, 80% mid/distal circumflex and 90% proximal LAD.  EF 25-30%  Seen by Dr Roxan Hockey For CABG ischemic DCM tomorrow PFT;s ok dopplers pending    Jenkins Rouge

## 2017-09-17 NOTE — Progress Notes (Signed)
ARTERTIAL BLOOD GAS ATTEMPTED X'S 3. LAST ATTEMPT WAS VENOUS BLOOD. PAGED DR VAN TRIGT TO MAKE HIM AWARE THAT OUR ATTEMPTS WERE  UNSUCCESSFUL.

## 2017-09-17 NOTE — Progress Notes (Addendum)
2 Days Post-Op Procedure(s) (LRB): LEFT HEART CATH AND CORONARY ANGIOGRAPHY (N/A) Subjective: No complaints  Objective: Vital signs in last 24 hours: Temp:  [97.7 F (36.5 C)-98.3 F (36.8 C)] 97.7 F (36.5 C) (06/26 1445) Pulse Rate:  [77-83] 77 (06/26 1445) Cardiac Rhythm: Normal sinus rhythm (06/26 0828) Resp:  [17-18] 18 (06/26 0630) BP: (111-144)/(50-63) 118/56 (06/26 1445) SpO2:  [97 %-100 %] 97 % (06/26 1445) Weight:  [197 lb 1.6 oz (89.4 kg)] 197 lb 1.6 oz (89.4 kg) (06/26 0630)  Hemodynamic parameters for last 24 hours:    Intake/Output from previous day: 06/25 0701 - 06/26 0700 In: 243 [P.O.:240; I.V.:3] Out: 600 [Urine:600] Intake/Output this shift: Total I/O In: 240 [P.O.:240] Out: -   General appearance: alert, cooperative and no distress Neurologic: intact Heart: regular rate and rhythm Lungs: clear to auscultation bilaterally  Lab Results: Recent Labs    09/16/17 0609 09/17/17 0558  WBC 6.4 5.4  HGB 10.1* 9.9*  HCT 32.8* 32.2*  PLT 354 323   BMET:  Recent Labs    09/16/17 0609  NA 139  K 4.4  CL 107  CO2 22  GLUCOSE 169*  BUN 14  CREATININE 1.13*  CALCIUM 9.3    PT/INR: No results for input(s): LABPROT, INR in the last 72 hours. ABG No results found for: PHART, HCO3, TCO2, ACIDBASEDEF, O2SAT CBG (last 3)  Recent Labs    09/16/17 2119 09/17/17 0803 09/17/17 1156  GLUCAP 154* 188* 196*    Assessment/Plan: S/P Procedure(s) (LRB): LEFT HEART CATH AND CORONARY ANGIOGRAPHY (N/A) -  Severe 2 vessel CAD with impaired LV function s/p non-STEMI For CABG tomorrow All questions answered LICA 40-98% stenosis, RICA Ok PFTs- flows 70%, DLCO 62% No metoprolol preop due to adverse reactions   LOS: 5 days    [name] 09/17/2017

## 2017-09-17 NOTE — Plan of Care (Signed)
  Problem: Clinical Measurements: Goal: Cardiovascular complication will be avoided Outcome: Progressing   

## 2017-09-17 NOTE — Care Management Note (Signed)
Case Management Note  Patient Details  Name: Karen Tate MRN: [id number] Date of Birth: 10/14/1945  Subjective/Objective:   Pt presented for Nstemi- s/p cath 09-15-17 that revealed 3 vessel CAD. Plan for CABG 09-18-17. PTA from home with the support of husband. Patient states she uses [facility] at home.               Action/Plan: CM will continue to monitor post surgery for disposition needs.   Expected Discharge Date:  09/16/17               Expected Discharge Plan:  [place]  In-House Referral:  NA  Discharge planning Services  CM Consult  Post Acute Care Choice:    Choice offered to:     DME Arranged:    DME Agency:     HH Arranged:    HH Agency:     Status of Service:  In process, will continue to follow  If discussed at Long Length of Stay Meetings, dates discussed:    Additional Comments:  Bethena Roys, RN 09/17/2017, 11:54 AM

## 2017-09-17 NOTE — Anesthesia Preprocedure Evaluation (Addendum)
Anesthesia Evaluation  Patient identified by MRN, date of birth, ID band Patient awake    Reviewed: Allergy & Precautions, NPO status , Patient's Chart, lab work & pertinent test results  Airway Mallampati: II  TM Distance: >3 FB Neck ROM: Full    Dental  (+) Teeth Intact, Poor Dentition, Dental Advisory Given   Pulmonary former smoker,    breath sounds clear to auscultation       Cardiovascular hypertension, Pt. on medications + angina + Past MI   Rhythm:Regular Rate:Normal     Neuro/Psych Anxiety  Neuromuscular disease    GI/Hepatic Neg liver ROS, hiatal hernia, GERD  Medicated,  Endo/Other  diabetes, Type 2, Oral Hypoglycemic Agents  Renal/GU      Musculoskeletal  (+) Arthritis ,   Abdominal Normal abdominal exam  (+)   Peds  Hematology   Anesthesia Other Findings   Reproductive/Obstetrics                          Lab Results  Component Value Date   WBC 5.4 09/17/2017   HGB 9.9 (L) 09/17/2017   HCT 32.2 (L) 09/17/2017   MCV 95.0 09/17/2017   PLT 323 09/17/2017   Lab Results  Component Value Date   CREATININE 1.13 (H) 09/16/2017   BUN 14 09/16/2017   NA 139 09/16/2017   K 4.4 09/16/2017   CL 107 09/16/2017   CO2 22 09/16/2017   Lab Results  Component Value Date   INR 0.95 09/17/2017   Cath:  Ramus lesion is 99% stenosed.  Mid Cx to Dist Cx lesion is 80% stenosed.  Ost LAD to Prox LAD lesion is 70% stenosed.  Prox LAD lesion is 90% stenosed.  There is moderate to severe left ventricular systolic dysfunction.  LV end diastolic pressure is severely elevated.  The left ventricular ejection fraction is 25-35% by visual estimate.  Anesthesia Physical Anesthesia Plan  ASA: IV  Anesthesia Plan: General   Post-op Pain Management:    Induction: Intravenous  PONV Risk Score and Plan: 2 and Treatment may vary due to age or medical condition  Airway Management  Planned: Oral ETT  Additional Equipment: Arterial line, PA Cath, CVP, TEE and Ultrasound Guidance Line Placement  Intra-op Plan:   Post-operative Plan: Post-operative intubation/ventilation  Informed Consent:   Plan Discussed with:   Anesthesia Plan Comments:         Anesthesia Quick Evaluation

## 2017-09-17 NOTE — Progress Notes (Signed)
CARDIAC REHAB PHASE I   PRE:  Rate/Rhythm: 87 SR  BP:  Supine: 138/64  Sitting:   Standing:    SaO2: 98%RA  MODE:  Ambulation: 200 ft   POST:  Rate/Rhythm: 118 ST  BP:  Supine:   Sitting: 160/80  Standing:    SaO2: 98%RA 0932-1000 Pt walked 200 ft on RA with rolling walker and minimal asst. Stopped several times to rest. No CP. Encouraged her to use IS. Demonstrated correctly. Will follow up after surgery.   Graylon Good, RN BSN  09/17/2017 9:57 AM

## 2017-09-18 ENCOUNTER — Inpatient Hospital Stay (HOSPITAL_COMMUNITY): Payer: Medicare Other | Admitting: Anesthesiology

## 2017-09-18 ENCOUNTER — Inpatient Hospital Stay (HOSPITAL_COMMUNITY)
Admission: EM | Disposition: A | Discharge status: Home / Self Care | Payer: Self-pay | Source: Ambulatory Visit | Attending: Thoracic Surgery (Cardiothoracic Vascular Surgery)

## 2017-09-18 ENCOUNTER — Inpatient Hospital Stay (HOSPITAL_COMMUNITY): Payer: Medicare Other

## 2017-09-18 ENCOUNTER — Encounter (HOSPITAL_COMMUNITY): Payer: Self-pay | Admitting: Critical Care Medicine

## 2017-09-18 ENCOUNTER — Other Ambulatory Visit: Payer: Self-pay

## 2017-09-18 DIAGNOSIS — I25110 Atherosclerotic heart disease of native coronary artery with unstable angina pectoris: Secondary | ICD-10-CM

## 2017-09-18 DIAGNOSIS — Z951 Presence of aortocoronary bypass graft: Secondary | ICD-10-CM

## 2017-09-18 HISTORY — PX: TEE WITHOUT CARDIOVERSION: SHX5443

## 2017-09-18 HISTORY — PX: CORONARY ARTERY BYPASS GRAFT: SHX141

## 2017-09-18 LAB — POCT I-STAT, CHEM 8
BUN: 20 mg/dL (ref 8–23)
BUN: 16 mg/dL (ref 8–23)
BUN: 19 mg/dL (ref 8–23)
BUN: 17 mg/dL (ref 8–23)
BUN: 17 mg/dL (ref 8–23)
BUN: 16 mg/dL (ref 8–23)
Calcium, Ion: 1.25 mmol/L (ref 1.15–1.40)
Calcium, Ion: 1.01 mmol/L — ABNORMAL LOW (ref 1.15–1.40)
Calcium, Ion: 1.24 mmol/L (ref 1.15–1.40)
Calcium, Ion: 1.07 mmol/L — ABNORMAL LOW (ref 1.15–1.40)
Calcium, Ion: 1.09 mmol/L — ABNORMAL LOW (ref 1.15–1.40)
Calcium, Ion: 1.22 mmol/L (ref 1.15–1.40)
Chloride: 105 mmol/L (ref 98–111)
Chloride: 102 mmol/L (ref 98–111)
Chloride: 105 mmol/L (ref 98–111)
Chloride: 105 mmol/L (ref 98–111)
Chloride: 105 mmol/L (ref 98–111)
Chloride: 107 mmol/L (ref 98–111)
Creatinine, Ser: 0.9 mg/dL (ref 0.44–1.00)
Creatinine, Ser: 0.7 mg/dL (ref 0.44–1.00)
Creatinine, Ser: 0.9 mg/dL (ref 0.44–1.00)
Creatinine, Ser: 0.9 mg/dL (ref 0.44–1.00)
Creatinine, Ser: 0.7 mg/dL (ref 0.44–1.00)
Creatinine, Ser: 0.9 mg/dL (ref 0.44–1.00)
Glucose, Bld: 228 mg/dL — ABNORMAL HIGH (ref 70–99)
Glucose, Bld: 164 mg/dL — ABNORMAL HIGH (ref 70–99)
Glucose, Bld: 192 mg/dL — ABNORMAL HIGH (ref 70–99)
Glucose, Bld: 198 mg/dL — ABNORMAL HIGH (ref 70–99)
Glucose, Bld: 210 mg/dL — ABNORMAL HIGH (ref 70–99)
Glucose, Bld: 176 mg/dL — ABNORMAL HIGH (ref 70–99)
HCT: 29 % — ABNORMAL LOW (ref 36.0–46.0)
HCT: 21 % — ABNORMAL LOW (ref 36.0–46.0)
HCT: 27 % — ABNORMAL LOW (ref 36.0–46.0)
HCT: 23 % — ABNORMAL LOW (ref 36.0–46.0)
HCT: 22 % — ABNORMAL LOW (ref 36.0–46.0)
HCT: 33 % — ABNORMAL LOW (ref 36.0–46.0)
Hemoglobin: 9.9 g/dL — ABNORMAL LOW (ref 12.0–15.0)
Hemoglobin: 7.1 g/dL — ABNORMAL LOW (ref 12.0–15.0)
Hemoglobin: 9.2 g/dL — ABNORMAL LOW (ref 12.0–15.0)
Hemoglobin: 7.8 g/dL — ABNORMAL LOW (ref 12.0–15.0)
Hemoglobin: 7.5 g/dL — ABNORMAL LOW (ref 12.0–15.0)
Hemoglobin: 11.2 g/dL — ABNORMAL LOW (ref 12.0–15.0)
Potassium: 4.6 mmol/L (ref 3.5–5.1)
Potassium: 5 mmol/L (ref 3.5–5.1)
Potassium: 5.1 mmol/L (ref 3.5–5.1)
Potassium: 5.8 mmol/L — ABNORMAL HIGH (ref 3.5–5.1)
Potassium: 5 mmol/L (ref 3.5–5.1)
Potassium: 4.6 mmol/L (ref 3.5–5.1)
Sodium: 139 mmol/L (ref 135–145)
Sodium: 139 mmol/L (ref 135–145)
Sodium: 140 mmol/L (ref 135–145)
Sodium: 140 mmol/L (ref 135–145)
Sodium: 141 mmol/L (ref 135–145)
Sodium: 139 mmol/L (ref 135–145)
TCO2: 25 mmol/L (ref 22–32)
TCO2: 22 mmol/L (ref 22–32)
TCO2: 23 mmol/L (ref 22–32)
TCO2: 24 mmol/L (ref 22–32)
TCO2: 25 mmol/L (ref 22–32)
TCO2: 22 mmol/L (ref 22–32)

## 2017-09-18 LAB — GLUCOSE, CAPILLARY
Glucose-Capillary: 210 mg/dL — ABNORMAL HIGH (ref 70–99)
Glucose-Capillary: 185 mg/dL — ABNORMAL HIGH (ref 70–99)
Glucose-Capillary: 164 mg/dL — ABNORMAL HIGH (ref 70–99)
Glucose-Capillary: 163 mg/dL — ABNORMAL HIGH (ref 70–99)
Glucose-Capillary: 162 mg/dL — ABNORMAL HIGH (ref 70–99)
Glucose-Capillary: 165 mg/dL — ABNORMAL HIGH (ref 70–99)
Glucose-Capillary: 171 mg/dL — ABNORMAL HIGH (ref 70–99)
Glucose-Capillary: 162 mg/dL — ABNORMAL HIGH (ref 70–99)
Glucose-Capillary: 183 mg/dL — ABNORMAL HIGH (ref 70–99)
Glucose-Capillary: 182 mg/dL — ABNORMAL HIGH (ref 70–99)
Glucose-Capillary: 154 mg/dL — ABNORMAL HIGH (ref 70–99)

## 2017-09-18 LAB — POCT I-STAT 3, VENOUS BLOOD GAS (G3P V)
Acid-base deficit: 5 mmol/L — ABNORMAL HIGH (ref 0.0–2.0)
Bicarbonate: 21.6 mmol/L (ref 20.0–28.0)
O2 Saturation: 62 %
TCO2: 23 mmol/L (ref 22–32)
pCO2, Ven: 44.2 mmHg (ref 44.0–60.0)
pH, Ven: 7.297 (ref 7.250–7.430)
pO2, Ven: 36 mmHg (ref 32.0–45.0)

## 2017-09-18 LAB — POCT I-STAT 3, ART BLOOD GAS (G3+)
Acid-base deficit: 2 mmol/L (ref 0.0–2.0)
Acid-base deficit: 3 mmol/L — ABNORMAL HIGH (ref 0.0–2.0)
Acid-base deficit: 6 mmol/L — ABNORMAL HIGH (ref 0.0–2.0)
Acid-base deficit: 5 mmol/L — ABNORMAL HIGH (ref 0.0–2.0)
Bicarbonate: 22.4 mmol/L (ref 20.0–28.0)
Bicarbonate: 22.1 mmol/L (ref 20.0–28.0)
Bicarbonate: 20.7 mmol/L (ref 20.0–28.0)
Bicarbonate: 21.2 mmol/L (ref 20.0–28.0)
O2 Saturation: 100 %
O2 Saturation: 100 %
O2 Saturation: 98 %
O2 Saturation: 98 %
Patient temperature: 35.9
Patient temperature: 36.2
TCO2: 24 mmol/L (ref 22–32)
TCO2: 23 mmol/L (ref 22–32)
TCO2: 22 mmol/L (ref 22–32)
TCO2: 22 mmol/L (ref 22–32)
pCO2 arterial: 37.5 mmHg (ref 32.0–48.0)
pCO2 arterial: 38 mmHg (ref 32.0–48.0)
pCO2 arterial: 44.4 mmHg (ref 32.0–48.0)
pCO2 arterial: 41.1 mmHg (ref 32.0–48.0)
pH, Arterial: 7.385 (ref 7.350–7.450)
pH, Arterial: 7.373 (ref 7.350–7.450)
pH, Arterial: 7.272 — ABNORMAL LOW (ref 7.350–7.450)
pH, Arterial: 7.317 — ABNORMAL LOW (ref 7.350–7.450)
pO2, Arterial: 402 mmHg — ABNORMAL HIGH (ref 83.0–108.0)
pO2, Arterial: 477 mmHg — ABNORMAL HIGH (ref 83.0–108.0)
pO2, Arterial: 113 mmHg — ABNORMAL HIGH (ref 83.0–108.0)
pO2, Arterial: 116 mmHg — ABNORMAL HIGH (ref 83.0–108.0)

## 2017-09-18 LAB — CBC
HCT: 33.7 % — ABNORMAL LOW (ref 36.0–46.0)
HCT: 34.7 % — ABNORMAL LOW (ref 36.0–46.0)
Hemoglobin: 10.5 g/dL — ABNORMAL LOW (ref 12.0–15.0)
Hemoglobin: 11 g/dL — ABNORMAL LOW (ref 12.0–15.0)
MCH: 29.3 pg (ref 26.0–34.0)
MCH: 29.3 pg (ref 26.0–34.0)
MCHC: 31.2 g/dL (ref 30.0–36.0)
MCHC: 31.7 g/dL (ref 30.0–36.0)
MCV: 94.1 fL (ref 78.0–100.0)
MCV: 92.5 fL (ref 78.0–100.0)
Platelets: 166 10*3/uL (ref 150–400)
Platelets: 168 10*3/uL (ref 150–400)
RBC: 3.58 MIL/uL — ABNORMAL LOW (ref 3.87–5.11)
RBC: 3.75 MIL/uL — ABNORMAL LOW (ref 3.87–5.11)
RDW: 14.7 % (ref 11.5–15.5)
RDW: 15.2 % (ref 11.5–15.5)
WBC: 12.4 10*3/uL — ABNORMAL HIGH (ref 4.0–10.5)
WBC: 10.7 10*3/uL — ABNORMAL HIGH (ref 4.0–10.5)

## 2017-09-18 LAB — CREATININE, SERUM
Creatinine, Ser: 1 mg/dL (ref 0.44–1.00)
GFR calc Af Amer: >60 mL/min (ref >60)
GFR calc non Af Amer: 55 mL/min — ABNORMAL LOW (ref >60)

## 2017-09-18 LAB — MAGNESIUM: Magnesium: 3 mg/dL — ABNORMAL HIGH (ref 1.7–2.4)

## 2017-09-18 LAB — HEMOGLOBIN AND HEMATOCRIT, BLOOD
HCT: 23.7 % — ABNORMAL LOW (ref 36.0–46.0)
Hemoglobin: 7.4 g/dL — ABNORMAL LOW (ref 12.0–15.0)

## 2017-09-18 LAB — PREPARE RBC (CROSSMATCH)

## 2017-09-18 LAB — PLATELET COUNT: Platelets: 181 10*3/uL (ref 150–400)

## 2017-09-18 LAB — POCT I-STAT 4, (NA,K, GLUC, HGB,HCT)
Glucose, Bld: 191 mg/dL — ABNORMAL HIGH (ref 70–99)
HCT: 32 % — ABNORMAL LOW (ref 36.0–46.0)
Hemoglobin: 10.9 g/dL — ABNORMAL LOW (ref 12.0–15.0)
Potassium: 4.6 mmol/L (ref 3.5–5.1)
Sodium: 141 mmol/L (ref 135–145)

## 2017-09-18 LAB — PROTIME-INR
INR: 1.33
Prothrombin Time: 16.4 seconds — ABNORMAL HIGH (ref 11.4–15.2)

## 2017-09-18 LAB — APTT: aPTT: 34 seconds (ref 24–36)

## 2017-09-18 SURGERY — CORONARY ARTERY BYPASS GRAFTING (CABG)
Anesthesia: General | Site: Chest

## 2017-09-18 MED ORDER — DEXMEDETOMIDINE HCL IN NACL 200 MCG/50ML IV SOLN
0.0000 ug/kg/h | INTRAVENOUS | Status: DC
  Filled 2017-09-18: qty 50

## 2017-09-18 MED ORDER — MIDAZOLAM HCL 2 MG/2ML IJ SOLN: 2.0000 mg | INTRAMUSCULAR | Status: DC | PRN

## 2017-09-18 MED ORDER — ACETAMINOPHEN 650 MG RE SUPP
650.0000 mg | Freq: Once | RECTAL | Status: AC
  Administered 2017-09-18: 650 mg via RECTAL

## 2017-09-18 MED ORDER — ALBUMIN HUMAN 5 % IV SOLN
INTRAVENOUS | Status: DC | PRN
  Administered 2017-09-18 (×2): via INTRAVENOUS

## 2017-09-18 MED ORDER — ARTIFICIAL TEARS OPHTHALMIC OINT
TOPICAL_OINTMENT | OPHTHALMIC | Status: DC | PRN
  Administered 2017-09-18: 1 via OPHTHALMIC

## 2017-09-18 MED ORDER — PANTOPRAZOLE SODIUM 40 MG PO TBEC
40.0000 mg | DELAYED_RELEASE_TABLET | Freq: Every day | ORAL | Status: DC
  Administered 2017-09-20 – 2017-09-21 (×2): 40 mg via ORAL
  Filled 2017-09-18 (×2): qty 1

## 2017-09-18 MED ORDER — BISACODYL 5 MG PO TBEC
10.0000 mg | DELAYED_RELEASE_TABLET | Freq: Every day | ORAL | Status: DC
  Administered 2017-09-19: 10 mg via ORAL
  Filled 2017-09-18: qty 2

## 2017-09-18 MED ORDER — ORAL CARE MOUTH RINSE
15.0000 mL | Freq: Two times a day (BID) | OROMUCOSAL | Status: DC
  Administered 2017-09-20 – 2017-09-21 (×2): 15 mL via OROMUCOSAL

## 2017-09-18 MED ORDER — PHENYLEPHRINE 40 MCG/ML (10ML) SYRINGE FOR IV PUSH (FOR BLOOD PRESSURE SUPPORT)
PREFILLED_SYRINGE | INTRAVENOUS | Status: DC | PRN
  Administered 2017-09-18 (×5): 40 ug via INTRAVENOUS
  Administered 2017-09-18: 80 ug via INTRAVENOUS
  Administered 2017-09-18 (×6): 40 ug via INTRAVENOUS

## 2017-09-18 MED ORDER — SODIUM CHLORIDE 0.9% FLUSH: 3.0000 mL | Freq: Two times a day (BID) | INTRAVENOUS | Status: DC

## 2017-09-18 MED ORDER — HEPARIN SODIUM (PORCINE) 1000 UNIT/ML IJ SOLN
INTRAMUSCULAR | Status: DC | PRN
  Administered 2017-09-18: 2000 [IU] via INTRAVENOUS
  Administered 2017-09-18: 28000 [IU] via INTRAVENOUS

## 2017-09-18 MED ORDER — FENTANYL CITRATE (PF) 250 MCG/5ML IJ SOLN
INTRAMUSCULAR | Status: DC | PRN
  Administered 2017-09-18 (×2): 50 ug via INTRAVENOUS
  Administered 2017-09-18: 100 ug via INTRAVENOUS
  Administered 2017-09-18: 50 ug via INTRAVENOUS
  Administered 2017-09-18: 25 ug via INTRAVENOUS
  Administered 2017-09-18 (×2): 100 ug via INTRAVENOUS
  Administered 2017-09-18: 25 ug via INTRAVENOUS
  Administered 2017-09-18 (×2): 150 ug via INTRAVENOUS
  Administered 2017-09-18 (×2): 50 ug via INTRAVENOUS
  Administered 2017-09-18: 100 ug via INTRAVENOUS
  Administered 2017-09-18: 250 ug via INTRAVENOUS
  Administered 2017-09-18: 100 ug via INTRAVENOUS

## 2017-09-18 MED ORDER — ONDANSETRON HCL 4 MG/2ML IJ SOLN
INTRAMUSCULAR | Status: AC
  Filled 2017-09-18: qty 2

## 2017-09-18 MED ORDER — ASPIRIN EC 325 MG PO TBEC: 325.0000 mg | DELAYED_RELEASE_TABLET | Freq: Every day | ORAL | Status: DC

## 2017-09-18 MED ORDER — LACTATED RINGERS IV SOLN: 500.0000 mL | Freq: Once | INTRAVENOUS | Status: DC | PRN

## 2017-09-18 MED ORDER — SODIUM CHLORIDE 0.9 % IJ SOLN
INTRAMUSCULAR | Status: AC
  Filled 2017-09-18: qty 20

## 2017-09-18 MED ORDER — SODIUM CHLORIDE 0.9 % IV SOLN
1.5000 g | Freq: Two times a day (BID) | INTRAVENOUS | Status: AC
  Administered 2017-09-18 – 2017-09-20 (×4): 1.5 g via INTRAVENOUS
  Filled 2017-09-18 (×4): qty 1.5

## 2017-09-18 MED ORDER — ONDANSETRON HCL 4 MG/2ML IJ SOLN
4.0000 mg | Freq: Four times a day (QID) | INTRAMUSCULAR | Status: DC | PRN
  Administered 2017-09-18 – 2017-09-19 (×2): 4 mg via INTRAVENOUS
  Filled 2017-09-18 (×2): qty 2

## 2017-09-18 MED ORDER — ROCURONIUM BROMIDE 50 MG/5ML IV SOLN
INTRAVENOUS | Status: AC
  Filled 2017-09-18: qty 2

## 2017-09-18 MED ORDER — SODIUM CHLORIDE 0.9 % IV SOLN
INTRAVENOUS | Status: DC
  Administered 2017-09-18: 13:00:00 via INTRAVENOUS
  Administered 2017-09-20: 10 mL via INTRAVENOUS

## 2017-09-18 MED ORDER — ROCURONIUM BROMIDE 10 MG/ML (PF) SYRINGE
PREFILLED_SYRINGE | INTRAVENOUS | Status: DC | PRN
  Administered 2017-09-18: 30 mg via INTRAVENOUS
  Administered 2017-09-18: 70 mg via INTRAVENOUS
  Administered 2017-09-18 (×3): 50 mg via INTRAVENOUS

## 2017-09-18 MED ORDER — HEPARIN SODIUM (PORCINE) 1000 UNIT/ML IJ SOLN
INTRAMUSCULAR | Status: AC
  Filled 2017-09-18: qty 1

## 2017-09-18 MED ORDER — ARTIFICIAL TEARS OPHTHALMIC OINT
TOPICAL_OINTMENT | OPHTHALMIC | Status: AC
  Filled 2017-09-18: qty 3.5

## 2017-09-18 MED ORDER — OXYCODONE HCL 5 MG PO TABS: 5.0000 mg | ORAL_TABLET | ORAL | Status: DC | PRN

## 2017-09-18 MED ORDER — DEXAMETHASONE SODIUM PHOSPHATE 10 MG/ML IJ SOLN
INTRAMUSCULAR | Status: AC
  Filled 2017-09-18: qty 1

## 2017-09-18 MED ORDER — LACTATED RINGERS IV SOLN
INTRAVENOUS | Status: DC | PRN
  Administered 2017-09-18: 07:00:00 via INTRAVENOUS

## 2017-09-18 MED ORDER — SUGAMMADEX SODIUM 200 MG/2ML IV SOLN
INTRAVENOUS | Status: AC
  Filled 2017-09-18: qty 2

## 2017-09-18 MED ORDER — DOCUSATE SODIUM 100 MG PO CAPS
200.0000 mg | ORAL_CAPSULE | Freq: Every day | ORAL | Status: DC
  Administered 2017-09-19: 200 mg via ORAL
  Filled 2017-09-18: qty 2

## 2017-09-18 MED ORDER — ACETAMINOPHEN 500 MG PO TABS
1000.0000 mg | ORAL_TABLET | Freq: Four times a day (QID) | ORAL | Status: DC
  Administered 2017-09-18 – 2017-09-22 (×14): 1000 mg via ORAL
  Filled 2017-09-18 (×14): qty 2

## 2017-09-18 MED ORDER — SODIUM CHLORIDE 0.9% FLUSH: 3.0000 mL | INTRAVENOUS | Status: DC | PRN

## 2017-09-18 MED ORDER — ASPIRIN 81 MG PO CHEW: 324.0000 mg | CHEWABLE_TABLET | Freq: Every day | ORAL | Status: DC

## 2017-09-18 MED ORDER — PHENYLEPHRINE 40 MCG/ML (10ML) SYRINGE FOR IV PUSH (FOR BLOOD PRESSURE SUPPORT)
PREFILLED_SYRINGE | INTRAVENOUS | Status: AC
  Filled 2017-09-18: qty 10

## 2017-09-18 MED ORDER — METOPROLOL TARTRATE 5 MG/5ML IV SOLN
2.5000 mg | INTRAVENOUS | Status: DC | PRN
  Administered 2017-09-19: 5 mg via INTRAVENOUS
  Filled 2017-09-18: qty 5

## 2017-09-18 MED ORDER — SODIUM CHLORIDE 0.9 % IV SOLN
INTRAVENOUS | Status: DC
  Administered 2017-09-18: 13.5 [IU]/h via INTRAVENOUS
  Administered 2017-09-19: 10.9 [IU]/h via INTRAVENOUS
  Filled 2017-09-18 (×2): qty 1

## 2017-09-18 MED ORDER — INSULIN REGULAR BOLUS VIA INFUSION
0.0000 [IU] | Freq: Three times a day (TID) | INTRAVENOUS | Status: DC
  Filled 2017-09-18: qty 10

## 2017-09-18 MED ORDER — ALBUMIN HUMAN 5 % IV SOLN
250.0000 mL | INTRAVENOUS | Status: AC | PRN
  Administered 2017-09-18 – 2017-09-19 (×2): 250 mL via INTRAVENOUS

## 2017-09-18 MED ORDER — EPHEDRINE SULFATE 50 MG/ML IJ SOLN
INTRAMUSCULAR | Status: AC
  Filled 2017-09-18: qty 1

## 2017-09-18 MED ORDER — PROPOFOL 10 MG/ML IV BOLUS
INTRAVENOUS | Status: AC
  Filled 2017-09-18: qty 20

## 2017-09-18 MED ORDER — SODIUM CHLORIDE 0.9 % IV SOLN
0.0000 ug/min | INTRAVENOUS | Status: DC
  Filled 2017-09-18: qty 2

## 2017-09-18 MED ORDER — MIDAZOLAM HCL 5 MG/5ML IJ SOLN
INTRAMUSCULAR | Status: DC | PRN
  Administered 2017-09-18: 2 mg via INTRAVENOUS
  Administered 2017-09-18 (×5): 1 mg via INTRAVENOUS
  Administered 2017-09-18: 2 mg via INTRAVENOUS
  Administered 2017-09-18: 1 mg via INTRAVENOUS

## 2017-09-18 MED ORDER — SODIUM CHLORIDE 0.9% FLUSH: 10.0000 mL | INTRAVENOUS | Status: DC | PRN

## 2017-09-18 MED ORDER — MIDAZOLAM HCL 10 MG/2ML IJ SOLN
INTRAMUSCULAR | Status: AC
  Filled 2017-09-18: qty 2

## 2017-09-18 MED ORDER — METOCLOPRAMIDE HCL 5 MG/ML IJ SOLN
10.0000 mg | Freq: Four times a day (QID) | INTRAMUSCULAR | Status: DC
  Administered 2017-09-18 – 2017-09-22 (×12): 10 mg via INTRAVENOUS
  Filled 2017-09-18 (×11): qty 2

## 2017-09-18 MED ORDER — NITROGLYCERIN IN D5W 200-5 MCG/ML-% IV SOLN
0.0000 ug/min | INTRAVENOUS | Status: DC
  Filled 2017-09-18: qty 250

## 2017-09-18 MED ORDER — CALCIUM CHLORIDE 10 % IV SOLN
INTRAVENOUS | Status: DC | PRN
  Administered 2017-09-18 (×2): 100 mg via INTRAVENOUS
  Administered 2017-09-18: 400 mg via INTRAVENOUS

## 2017-09-18 MED ORDER — POTASSIUM CHLORIDE 10 MEQ/50ML IV SOLN: 10.0000 meq | INTRAVENOUS | Status: AC

## 2017-09-18 MED ORDER — CEFUROXIME SODIUM 750 MG IJ SOLR
INTRAMUSCULAR | Status: DC | PRN
  Administered 2017-09-18: 750 mg via INTRAVENOUS

## 2017-09-18 MED ORDER — CHLORHEXIDINE GLUCONATE 0.12 % MT SOLN
15.0000 mL | OROMUCOSAL | Status: AC
  Administered 2017-09-18: 15 mL via OROMUCOSAL

## 2017-09-18 MED ORDER — BISACODYL 10 MG RE SUPP: 10.0000 mg | Freq: Every day | RECTAL | Status: DC

## 2017-09-18 MED ORDER — CHLORHEXIDINE GLUCONATE CLOTH 2 % EX PADS
6.0000 | MEDICATED_PAD | Freq: Every day | CUTANEOUS | Status: DC
  Administered 2017-09-18 – 2017-09-25 (×6): 6 via TOPICAL

## 2017-09-18 MED ORDER — SUCCINYLCHOLINE CHLORIDE 200 MG/10ML IV SOSY
PREFILLED_SYRINGE | INTRAVENOUS | Status: AC
  Filled 2017-09-18: qty 10

## 2017-09-18 MED ORDER — FENTANYL CITRATE (PF) 250 MCG/5ML IJ SOLN
INTRAMUSCULAR | Status: AC
  Filled 2017-09-18: qty 25

## 2017-09-18 MED ORDER — TRAMADOL HCL 50 MG PO TABS
50.0000 mg | ORAL_TABLET | ORAL | Status: DC | PRN
  Administered 2017-09-19: 100 mg via ORAL
  Filled 2017-09-18: qty 2

## 2017-09-18 MED ORDER — LIDOCAINE 2% (20 MG/ML) 5 ML SYRINGE
INTRAMUSCULAR | Status: AC
  Filled 2017-09-18: qty 5

## 2017-09-18 MED ORDER — ACETAMINOPHEN 160 MG/5ML PO SOLN: 650.0000 mg | Freq: Once | ORAL | Status: AC

## 2017-09-18 MED ORDER — FAMOTIDINE IN NACL 20-0.9 MG/50ML-% IV SOLN
20.0000 mg | Freq: Two times a day (BID) | INTRAVENOUS | Status: DC
  Administered 2017-09-18: 20 mg via INTRAVENOUS

## 2017-09-18 MED ORDER — LACTATED RINGERS IV SOLN: INTRAVENOUS | Status: DC

## 2017-09-18 MED ORDER — ACETAMINOPHEN 160 MG/5ML PO SOLN: 1000.0000 mg | Freq: Four times a day (QID) | ORAL | Status: DC

## 2017-09-18 MED ORDER — LACTATED RINGERS IV SOLN
INTRAVENOUS | Status: DC
  Administered 2017-09-19: 12:00:00 via INTRAVENOUS

## 2017-09-18 MED ORDER — PROTAMINE SULFATE 10 MG/ML IV SOLN
INTRAVENOUS | Status: DC | PRN
  Administered 2017-09-18: 280 mg via INTRAVENOUS
  Administered 2017-09-18: 20 mg via INTRAVENOUS

## 2017-09-18 MED ORDER — MORPHINE SULFATE (PF) 2 MG/ML IV SOLN
2.0000 mg | INTRAVENOUS | Status: DC | PRN
  Administered 2017-09-19: 2 mg via INTRAVENOUS
  Filled 2017-09-18 (×2): qty 1

## 2017-09-18 MED ORDER — PROPOFOL 10 MG/ML IV BOLUS
INTRAVENOUS | Status: DC | PRN
  Administered 2017-09-18: 50 mg via INTRAVENOUS
  Administered 2017-09-18: 80 mg via INTRAVENOUS

## 2017-09-18 MED ORDER — ORAL CARE MOUTH RINSE
15.0000 mL | OROMUCOSAL | Status: DC
  Administered 2017-09-18 (×2): 15 mL via OROMUCOSAL

## 2017-09-18 MED ORDER — METOPROLOL TARTRATE 12.5 MG HALF TABLET: 12.5000 mg | ORAL_TABLET | Freq: Two times a day (BID) | ORAL | Status: DC

## 2017-09-18 MED ORDER — SODIUM CHLORIDE 0.9 % IR SOLN
Status: DC | PRN
  Administered 2017-09-18: 5000 mL

## 2017-09-18 MED ORDER — SODIUM CHLORIDE 0.9 % IV SOLN
INTRAVENOUS | Status: DC | PRN
  Administered 2017-09-18: 12:00:00 via INTRAVENOUS

## 2017-09-18 MED ORDER — VANCOMYCIN HCL IN DEXTROSE 1-5 GM/200ML-% IV SOLN
1000.0000 mg | Freq: Once | INTRAVENOUS | Status: AC
  Administered 2017-09-18: 1000 mg via INTRAVENOUS
  Filled 2017-09-18: qty 200

## 2017-09-18 MED ORDER — MAGNESIUM SULFATE 4 GM/100ML IV SOLN
4.0000 g | Freq: Once | INTRAVENOUS | Status: AC
  Administered 2017-09-18: 4 g via INTRAVENOUS
  Filled 2017-09-18: qty 100

## 2017-09-18 MED ORDER — FENTANYL CITRATE (PF) 250 MCG/5ML IJ SOLN
INTRAMUSCULAR | Status: AC
  Filled 2017-09-18: qty 5

## 2017-09-18 MED ORDER — HEMOSTATIC AGENTS (NO CHARGE) OPTIME
TOPICAL | Status: DC | PRN
  Administered 2017-09-18 (×3): 1

## 2017-09-18 MED ORDER — SODIUM BICARBONATE 8.4 % IV SOLN
50.0000 meq | Freq: Once | INTRAVENOUS | Status: AC
  Administered 2017-09-18: 50 meq via INTRAVENOUS

## 2017-09-18 MED ORDER — CHLORHEXIDINE GLUCONATE 0.12% ORAL RINSE (MEDLINE KIT)
15.0000 mL | Freq: Two times a day (BID) | OROMUCOSAL | Status: DC
  Administered 2017-09-18: 15 mL via OROMUCOSAL

## 2017-09-18 MED ORDER — SODIUM CHLORIDE 0.9% FLUSH
10.0000 mL | Freq: Two times a day (BID) | INTRAVENOUS | Status: DC
  Administered 2017-09-18 – 2017-09-19 (×3): 10 mL
  Administered 2017-09-19: 40 mL
  Administered 2017-09-20: 20 mL
  Administered 2017-09-20 – 2017-09-21 (×2): 10 mL

## 2017-09-18 MED ORDER — PROTAMINE SULFATE 10 MG/ML IV SOLN
INTRAVENOUS | Status: AC
  Filled 2017-09-18: qty 25

## 2017-09-18 MED ORDER — METOPROLOL TARTRATE 25 MG/10 ML ORAL SUSPENSION: 12.5000 mg | Freq: Two times a day (BID) | ORAL | Status: DC

## 2017-09-18 MED ORDER — LACTATED RINGERS IV SOLN
INTRAVENOUS | Status: DC | PRN
  Administered 2017-09-18 (×2): via INTRAVENOUS

## 2017-09-18 MED ORDER — SODIUM CHLORIDE 0.9 % IV SOLN: 250.0000 mL | INTRAVENOUS | Status: DC

## 2017-09-18 MED ORDER — DOPAMINE-DEXTROSE 3.2-5 MG/ML-% IV SOLN
5.0000 ug/kg/min | INTRAVENOUS | Status: DC
  Administered 2017-09-18: 5 ug/kg/min via INTRAVENOUS

## 2017-09-18 MED ORDER — PROTAMINE SULFATE 10 MG/ML IV SOLN
INTRAVENOUS | Status: AC
  Filled 2017-09-18: qty 5

## 2017-09-18 MED ORDER — SODIUM CHLORIDE 0.45 % IV SOLN: INTRAVENOUS | Status: DC | PRN

## 2017-09-18 MED ORDER — VASOPRESSIN 20 UNIT/ML IV SOLN
INTRAVENOUS | Status: AC
  Filled 2017-09-18: qty 1

## 2017-09-18 MED ORDER — MORPHINE SULFATE (PF) 2 MG/ML IV SOLN: 1.0000 mg | INTRAVENOUS | Status: AC | PRN

## 2017-09-18 SURGICAL SUPPLY — 93 items
ADAPTER MULTI PERFUSION 15 (ADAPTER) ×1 IMPLANT
ADH SKN CLS APL DERMABOND .7 (GAUZE/BANDAGES/DRESSINGS) ×2
BAG DECANTER FOR FLEXI CONT (MISCELLANEOUS) ×3 IMPLANT
BANDAGE ACE 4X5 VEL STRL LF (GAUZE/BANDAGES/DRESSINGS) ×3 IMPLANT
BANDAGE ACE 6X5 VEL STRL LF (GAUZE/BANDAGES/DRESSINGS) ×3 IMPLANT
BASKET HEART (ORDER IN 25'S) (MISCELLANEOUS) ×1
BASKET HEART (ORDER IN 25S) (MISCELLANEOUS) ×2 IMPLANT
BLADE STERNUM SYSTEM 6 (BLADE) ×3 IMPLANT
BLADE SURG 11 STRL SS (BLADE) ×1 IMPLANT
BNDG GAUZE ELAST 4 BULKY (GAUZE/BANDAGES/DRESSINGS) ×3 IMPLANT
CANISTER SUCT 3000ML PPV (MISCELLANEOUS) ×3 IMPLANT
CANNULA EZ GLIDE AORTIC 21FR (CANNULA) ×3 IMPLANT
CATH CPB KIT HENDRICKSON (MISCELLANEOUS) ×3 IMPLANT
CATH ROBINSON RED A/P 18FR (CATHETERS) ×3 IMPLANT
CATH THORACIC 36FR (CATHETERS) ×3 IMPLANT
CATH THORACIC 36FR RT ANG (CATHETERS) ×3 IMPLANT
CLIP VESOCCLUDE MED 24/CT (CLIP) IMPLANT
CLIP VESOCCLUDE SM WIDE 24/CT (CLIP) ×1 IMPLANT
CRADLE DONUT ADULT HEAD (MISCELLANEOUS) ×3 IMPLANT
DERMABOND ADVANCED (GAUZE/BANDAGES/DRESSINGS) ×1
DERMABOND ADVANCED .7 DNX12 (GAUZE/BANDAGES/DRESSINGS) IMPLANT
DRAIN HEMOVAC 1/8 X 5 (WOUND CARE) ×1 IMPLANT
DRAPE CARDIOVASCULAR INCISE (DRAPES) ×3
DRAPE SLUSH/WARMER DISC (DRAPES) ×3 IMPLANT
DRAPE SRG 135X102X78XABS (DRAPES) ×2 IMPLANT
DRSG AQUACEL AG ADV 3.5X14 (GAUZE/BANDAGES/DRESSINGS) ×1 IMPLANT
DRSG COVADERM 4X14 (GAUZE/BANDAGES/DRESSINGS) ×3 IMPLANT
ELECT REM PT RETURN 9FT ADLT (ELECTROSURGICAL) ×6
ELECTRODE REM PT RTRN 9FT ADLT (ELECTROSURGICAL) ×4 IMPLANT
EVACUATOR SILICONE 100CC (DRAIN) ×1 IMPLANT
FELT TEFLON 1X6 (MISCELLANEOUS) ×6 IMPLANT
GAUZE SPONGE 4X4 12PLY STRL (GAUZE/BANDAGES/DRESSINGS) ×5 IMPLANT
GLOVE SURG SIGNA 7.5 PF LTX (GLOVE) ×9 IMPLANT
GOWN STRL REUS W/ TWL LRG LVL3 (GOWN DISPOSABLE) ×8 IMPLANT
GOWN STRL REUS W/ TWL XL LVL3 (GOWN DISPOSABLE) ×4 IMPLANT
GOWN STRL REUS W/TWL LRG LVL3 (GOWN DISPOSABLE) ×12
GOWN STRL REUS W/TWL XL LVL3 (GOWN DISPOSABLE) ×6
HEMOSTAT ARISTA ABSORB 3G PWDR (MISCELLANEOUS) ×1 IMPLANT
HEMOSTAT POWDER SURGIFOAM 1G (HEMOSTASIS) ×9 IMPLANT
HEMOSTAT SURGICEL 2X14 (HEMOSTASIS) ×3 IMPLANT
INSERT FOGARTY XLG (MISCELLANEOUS) IMPLANT
KIT BASIN OR (CUSTOM PROCEDURE TRAY) ×3 IMPLANT
KIT SUCTION CATH 14FR (SUCTIONS) ×6 IMPLANT
KIT TURNOVER KIT B (KITS) ×3 IMPLANT
KIT VASOVIEW HEMOPRO VH 3000 (KITS) ×3 IMPLANT
MARKER GRAFT CORONARY BYPASS (MISCELLANEOUS) ×9 IMPLANT
NS IRRIG 1000ML POUR BTL (IV SOLUTION) ×15 IMPLANT
PACK E OPEN HEART (SUTURE) ×3 IMPLANT
PACK OPEN HEART (CUSTOM PROCEDURE TRAY) ×3 IMPLANT
PAD ARMBOARD 7.5X6 YLW CONV (MISCELLANEOUS) ×6 IMPLANT
PAD ELECT DEFIB RADIOL ZOLL (MISCELLANEOUS) ×3 IMPLANT
PENCIL BUTTON HOLSTER BLD 10FT (ELECTRODE) ×3 IMPLANT
POWDER SURGICEL 3.0 GRAM (HEMOSTASIS) ×1 IMPLANT
PUNCH AORTIC ROTATE  4.5MM 8IN (MISCELLANEOUS) ×1 IMPLANT
PUNCH AORTIC ROTATE 4.0MM (MISCELLANEOUS) IMPLANT
PUNCH AORTIC ROTATE 4.5MM 8IN (MISCELLANEOUS) IMPLANT
PUNCH AORTIC ROTATE 5MM 8IN (MISCELLANEOUS) IMPLANT
RELOAD ENDO GIA 45X2.5 (ENDOMECHANICALS) ×1 IMPLANT
SET CARDIOPLEGIA MPS 5001102 (MISCELLANEOUS) ×1 IMPLANT
SOLUTION ANTI FOG 6CC (MISCELLANEOUS) ×1 IMPLANT
SUT BONE WAX W31G (SUTURE) ×3 IMPLANT
SUT ETHIBOND 2 0 SH (SUTURE) ×9
SUT ETHIBOND 2 0 SH 36X2 (SUTURE) IMPLANT
SUT MNCRL AB 4-0 PS2 18 (SUTURE) ×1 IMPLANT
SUT PROLENE 3 0 SH DA (SUTURE) ×3 IMPLANT
SUT PROLENE 4 0 RB 1 (SUTURE) ×6
SUT PROLENE 4 0 SH DA (SUTURE) IMPLANT
SUT PROLENE 4-0 RB1 .5 CRCL 36 (SUTURE) IMPLANT
SUT PROLENE 6 0 C 1 30 (SUTURE) ×6 IMPLANT
SUT PROLENE 7 0 BV 1 (SUTURE) ×1 IMPLANT
SUT PROLENE 7 0 BV1 MDA (SUTURE) ×4 IMPLANT
SUT PROLENE 8 0 BV175 6 (SUTURE) IMPLANT
SUT STEEL 6MS V (SUTURE) ×3 IMPLANT
SUT STEEL STERNAL CCS#1 18IN (SUTURE) IMPLANT
SUT STEEL SZ 6 DBL 3X14 BALL (SUTURE) ×3 IMPLANT
SUT VIC AB 1 CTX 36 (SUTURE) ×15
SUT VIC AB 1 CTX36XBRD ANBCTR (SUTURE) ×4 IMPLANT
SUT VIC AB 2-0 CT1 27 (SUTURE) ×6
SUT VIC AB 2-0 CT1 TAPERPNT 27 (SUTURE) IMPLANT
SUT VIC AB 2-0 CTX 27 (SUTURE) IMPLANT
SUT VIC AB 3-0 SH 27 (SUTURE)
SUT VIC AB 3-0 SH 27X BRD (SUTURE) IMPLANT
SUT VIC AB 3-0 X1 27 (SUTURE) IMPLANT
SUT VICRYL 4-0 PS2 18IN ABS (SUTURE) IMPLANT
SYSTEM SAHARA CHEST DRAIN ATS (WOUND CARE) ×3 IMPLANT
TAPE CLOTH SURG 4X10 WHT LF (GAUZE/BANDAGES/DRESSINGS) ×1 IMPLANT
TOWEL GREEN STERILE (TOWEL DISPOSABLE) ×3 IMPLANT
TOWEL GREEN STERILE FF (TOWEL DISPOSABLE) ×3 IMPLANT
TRAY FOLEY SLVR 16FR TEMP STAT (SET/KITS/TRAYS/PACK) ×3 IMPLANT
TUBE FEEDING 8FR 16IN STR KANG (MISCELLANEOUS) ×3 IMPLANT
TUBING INSUFFLATION (TUBING) ×3 IMPLANT
UNDERPAD 30X30 (UNDERPADS AND DIAPERS) ×3 IMPLANT
WATER STERILE IRR 1000ML POUR (IV SOLUTION) ×6 IMPLANT

## 2017-09-18 NOTE — Progress Notes (Signed)
Patient interviewed in the preop area. Patient confirms name, DOB, procedure, allergies, npo status and metal in body (left knee and back). Patient denies any pain at this time.   Leatha Gilding, RN

## 2017-09-18 NOTE — Anesthesia Procedure Notes (Signed)
Central Venous Catheter Insertion Performed by: Effie Berkshire, MD, anesthesiologist Start/End6/27/2019 7:15 AM, 09/18/2017 7:25 AM Patient location: Pre-op. Preanesthetic checklist: patient identified, IV checked, site marked, risks and benefits discussed, surgical consent, monitors and equipment checked, pre-op evaluation, timeout performed and anesthesia consent Position: Trendelenburg Lidocaine 1% used for infiltration and patient sedated Hand hygiene performed , maximum sterile barriers used  and Seldinger technique used Catheter size: 9 Fr Total catheter length 10. Central line was placed.Sheath introducer Swan type:thermodilution PA Cath depth:50 Procedure performed using ultrasound guided technique. Ultrasound Notes:anatomy identified, needle tip was noted to be adjacent to the nerve/plexus identified, no ultrasound evidence of intravascular and/or intraneural injection and image(s) printed for medical record Attempts: 1 Following insertion, line sutured and dressing applied. Post procedure assessment: blood return through all ports, free fluid flow and no air  Patient tolerated the procedure well with no immediate complications.

## 2017-09-18 NOTE — Progress Notes (Signed)
      [address]       [place] [address]             [phone]      Intubated, starting to wake up BP 134/71   Pulse 94   Temp (!) 96.1 F (35.6 C)   Resp 17   Ht 5\' 5"  (1.651 m)   Wt 195 lb 1.6 oz (88.5 kg)   SpO2 100%   BMI 32.47 kg/m  38/22 CI= 1.84  Intake/Output Summary (Last 24 hours) at 09/18/2017 1748 Last data filed at 09/18/2017 1700 Gross per 24 hour  Intake 4342.56 ml  Output 2301 ml  Net 2041.56 ml   Hct= 34, K= 4.6  Doing well early postop Wean to extubate  Remo Lipps C. Roxan Hockey, MD [facility] [phone] '

## 2017-09-18 NOTE — Progress Notes (Addendum)
Paged Roxan Hockey MD with ABG results and VC/NIF results and informed him of no cuff leak. Ordered to extubate but to have anesthesia on standby. Also clarified to keep nitro gtt on at 5 overnight and to wean neosynephrine and dopamine gtts. Will continue to monitor.

## 2017-09-18 NOTE — Progress Notes (Signed)
  Echocardiogram Echocardiogram Transesophageal has been performed.  Madelaine Etienne 09/18/2017, 8:28 AM

## 2017-09-18 NOTE — Brief Op Note (Signed)
09/12/2017 - 09/18/2017  1:25 PM  PATIENT:  Karen Tate  72 y.o. female  PRE-OPERATIVE DIAGNOSIS:  CAD  POST-OPERATIVE DIAGNOSIS:  2 vessel coronary artery disease  PROCEDURE:  Procedure(s) with comments:  CORONARY ARTERY BYPASS GRAFTING x 3 -LIMA to LAD -SEQUENTIAL SVG to RAMUS INTERMEDIATE 1 and 2  ENDOSCOPIC HARVEST GREATER SAPHENOUS VEIN -Right Thigh  TRANSESOPHAGEAL ECHOCARDIOGRAM (TEE) (N/A)  SURGEON:  Surgeon(s) and Role:    * [name], MD - Primary  PHYSICIAN ASSISTANT: Ellwood Handler PA-C   ANESTHESIA:   general  EBL:  1000 mL   BLOOD ADMINISTERED:1 unit PRBC and  CELLSAVER  DRAINS: Left Pleural Chest Tubes   LOCAL MEDICATIONS USED:  NONE  SPECIMEN:  No Specimen  DISPOSITION OF SPECIMEN:  N/A  COUNTS:  YES  TOURNIQUET:  * No tourniquets in log *  DICTATION: .Dragon Dictation  PLAN OF CARE: Admit to inpatient   PATIENT DISPOSITION:  ICU - intubated and hemodynamically stable.   Delay start of Pharmacological VTE agent (>24hrs) due to surgical blood loss or risk of bleeding: yes  XC= 61 min CPB= 85 min  Good conduits, Fair targets

## 2017-09-18 NOTE — Transfer of Care (Signed)
Immediate Anesthesia Transfer of Care Note  Patient: Karen Tate  Procedure(s) Performed: CORONARY ARTERY BYPASS GRAFTING (CABG) (N/A Chest) TRANSESOPHAGEAL ECHOCARDIOGRAM (TEE) (N/A )  Patient Location: SICU  Anesthesia Type:General  Level of Consciousness: Patient remains intubated per anesthesia plan  Airway & Oxygen Therapy: Patient remains intubated per anesthesia plan and Patient placed on Ventilator (see vital sign flow sheet for setting)  Post-op Assessment: Report given to RN and Post -op Vital signs reviewed and stable  Post vital signs: Reviewed and stable  Last Vitals:  Vitals Value Taken Time  BP 104/59 09/18/2017  1:25 PM  Temp    Pulse 94 09/18/2017  1:25 PM  Resp 13 09/18/2017  1:29 PM  SpO2 97 % 09/18/2017  1:29 PM  Vitals shown include unvalidated device data.  Last Pain:  Vitals:   09/18/17 0534  TempSrc: Oral  PainSc:       Patients Stated Pain Goal: 0 (05/69/79 4801)  Complications: No apparent anesthesia complications

## 2017-09-18 NOTE — Progress Notes (Signed)
      [address]       [place] [address]             [phone]      Intubated, sedated In SR, good BP Hemodynamics better, CI= 2.07 Still has a rub Repeat ECG showed diffuse ST elevation, some improvement from prior Clinical picture c/w postpericardiotomy pericardial inflammation, not STEMI Bedside echo with Dr. Marlou Porch- difficult windows, so limited views, but overall EF 35-40, no significant regional wall motion abnormalities  Remo Lipps C. Roxan Hockey, MD [facility] [phone]

## 2017-09-18 NOTE — Op Note (Signed)
NAME: ALPA, SALVO MEDICAL RECORD IO:[id number] ACCOUNT [id number] DATE OF BIRTH:[birth year] FACILITY: [facility] LOCATION: [facility] PHYSICIAN:Dezzie Badilla Chaya Jan, MD  OPERATIVE REPORT  DATE OF PROCEDURE:  09/18/2017  PREOPERATIVE DIAGNOSIS:  Severe 2-vessel coronary artery disease, status post non-ST elevation myocardial infarction.  POSTOPERATIVE DIAGNOSIS:  Severe 2-vessel coronary artery disease, status post non-ST elevation myocardial infarction.  PROCEDURE:  Median sternotomy, extracorporeal circulation, Coronary artery bypass grafting x 3  Left internal mammary artery to left anterior descending,  Sequential saphenous vein graft to ramus intermedius branches 1 and 2, Endoscopic vein harvest right thigh.  SURGEON:  Modesto Charon, MD  ASSISTANT:  Ellwood Handler, PA-C   ANESTHESIA:  General.  FINDINGS:  Mammary artery and saphenous vein good quality.  Targets fair quality, diffusely diseased.  Transesophageal echocardiography showed ejection fraction of 35% to 40% with anterior, anterolateral, and inferior hypokinesis.  Slight improvement  post-bypass.  No significant valvular pathology.  CLINICAL NOTE:  Karen Tate is a 72 year old woman with multiple cardiac risk factors.  She has been feeling poorly for about 6 months.  She presented with a 2-week history of exertional "heartburn" and shortness of breath.  During a stress test, she  developed "heartburn" and ST depression.  She was admitted.  She ruled in for a non-ST elevation MI.  Cardiac catheterization revealed severe 2-vessel disease not amenable to percutaneous intervention, and she was referred for coronary artery bypass  grafting.  The indications, risks, benefits, and alternatives were discussed in detail with the patient.  She understood and accepted the risks and agreed to proceed.  OPERATIVE NOTE:  The patient was brought to the preoperative holding area on 09/18/2017.  Dr. Smith Robert of the anesthesia service placed a  Swan-Ganz catheter and an arterial blood pressure monitoring line.  She was taken to the operating room, anesthetized  and intubated.  Intravenous antibiotics were administered.  A Foley catheter was placed.  Transesophageal echocardiography was performed.  Please see Dr. Smith Robert' separately dictated note for full details.  Findings as noted above.  The chest, abdomen and legs  were prepped and draped in the usual sterile fashion.  An incision was made over the medial aspect of the right leg at the level of the knee.  The greater saphenous vein was harvested endoscopically from the right thigh.  Saphenous vein was of good  quality.  Simultaneously, a median sternotomy was performed, and the left internal mammary artery was harvested using standard technique.  The mammary was relatively small but had good flow when divided distally.  Heparin 2000 units was administered  during the vessel harvest.  The remainder of the full heparin dose was given prior to opening the pericardium.  After harvesting the conduits and administering the heparin, the pericardium was opened.  The ascending aorta was inspected.  There was no evidence of atherosclerotic disease.  After confirming adequate anticoagulation with ACT measurements, the aorta was  cannulated via concentric 2-0 Ethibond pledgeted pursestring sutures.  A dual-stage venous cannula was placed via a purse suture in the right atrial appendage.  Cardiopulmonary bypass was initiated.  Flows were maintained per protocol.  The patient was  cooled to 34 degrees Celsius.  The coronary arteries were inspected, and anastomotic sites were chosen.  The conduits were inspected and cut to length.  A foam pad was placed in the pericardium to insulate the heart.  A temperature probe was placed in  the myocardial septum, and a cardioplegia cannula was placed in the ascending aorta.  The aorta was  cross clamped.  The left ventricle was emptied via the aortic root vent.  Cardiac  arrest then was achieved with a combination of cold antegrade blood cardioplegia and topical iced saline.  After achieving a complete diastolic arrest and  adequate septal cooling to 9 degrees Celsius, the following distal anastomoses were performed.  First, a reversed saphenous vein graft was placed sequentially to the first and second branches of the ramus intermedius.  The ramus intermedius was a dominant anterolateral branch.  It gave off a relatively small first branch and a larger second branch  more laterally.  A side-to-side anastomosis was performed to the first branch, which was a 1.5 mm fair quality target.  An end-to-side was done to OM2 which was a 2 mm fair quality target.  Both vessels were relatively diffusely diseased, but a probe did  pass distally.  All anastomoses were probed proximally and distally at their completion to ensure patency.  Cardioplegia was administered down the vein graft, and there was good flow and good hemostasis.  The left internal mammary artery was brought through a window in the pericardium.  The distal end was bevelled.  It was a 1.5 mm good quality conduit.  It was anastomosed end-to-side to the LAD, which was a good quality vessel at the site of anastomosis  but did have significant plaque both proximally and distally to that site.  The end-to-side anastomosis was performed with a running 8-0 Prolene suture.  At the completion of the anastomosis, the bulldog clamp was briefly removed to inspect for hemostasis.   Initially, there was no septal rewarming.  The mammary artery appeared to be in spasm.  Application of topical papaverine resulted in improvement of the appearance of the mammary artery immediately, and rapid septal rewarming was noted.  The bulldog  clamp was replaced.  The mammary pedicle was tacked to the epicardial surface of the heart with 6-0 Prolene sutures.  Additional cardioplegia was administered.  The cardioplegia cannula was removed from  the ascending aorta.  The proximal vein graft anastomosis was performed to a 4.5 mm punch aortotomy with a running 6-0 Prolene suture.  At the completion of the proximal  anastomosis,  the patient was placed in Trendelenburg position.  Lidocaine was administered.  The aortic root was deaired.  The bulldog clamp was removed from the mammary artery, and septal rewarming was noted again.  After deairing the aortic root,  the aortic crossclamp was removed.  The total crossclamp time was 61 minutes.  While rewarming was completed, all proximal and distal anastomoses were inspected for hemostasis.  Epicardial pacing wires were placed on the right ventricle and right atrium.  When the patient had rewarmed to a core temperature of 37 degrees Celsius,  she was weaned from cardiopulmonary bypass on the first attempt without difficulty.  The total bypass time was 85 minutes.  The initial cardiac index was approximately 1.5 L/min per sq m but with volume administration improved significantly.  A low-dose  dopamine infusion was initiated at 3 mcg/kg per minute, and cardiac index was greater than 2.  Transesophageal echocardiography showed some slight improvement in wall motion, but overall ejection fraction was still approximately 40%.  A test dose of  protamine was administered and was well tolerated.  The atrial and aortic cannulae were removed.  The remainder of the protamine was administered without incident.  The chest was irrigated with warm saline.  Hemostasis was achieved.  The pericardium was  reapproximated over the ascending aorta  and base of the heart with interrupted 3-0 silk sutures.  It came together easily without tension or kinking the underlying graft.  Left pleural mediastinal chest tubes were placed through separate subcostal  incisions.  The sternum was closed with a combination of single and double heavy gauge stainless steel wires.  Initially, after sternal closure, there was a decrease in the  cardiac output without any other changes in rhythm or hemodynamics.  She was  being paced.  Echo showed no significant change in wall motion. and over time with volume administration, the cardiac index improved again.  The pectoralis fascia, subcutaneous tissue, and skin were closed in standard fashion.  All sponge, needle and  instrument counts were correct at the end of the procedure.  The patient was taken from the operating room to the surgical ICU intubated and in good condition.  LN/NUANCE  D:09/18/2017 T:09/18/2017 JOB:[id number]

## 2017-09-18 NOTE — Progress Notes (Signed)
Pt transferred to pre-op. Pt was accompanied by transporting staff, family and this nurse. Pt now in the care of pre-op. This nurse will d/c further care.

## 2017-09-18 NOTE — Anesthesia Procedure Notes (Signed)
Procedure Name: Intubation Date/Time: 09/18/2017 8:21 AM Performed by: Wilburn Cornelia, CRNA Pre-anesthesia Checklist: Patient identified, Emergency Drugs available, Suction available, Patient being monitored and Timeout performed Patient Re-evaluated:Patient Re-evaluated prior to induction Oxygen Delivery Method: Circle system utilized Preoxygenation: Pre-oxygenation with 100% oxygen Induction Type: IV induction Ventilation: Oral airway inserted - appropriate to patient size Laryngoscope Size: Mac and 3 Grade View: Grade II Tube type: Oral Tube size: 8.0 mm Number of attempts: 1 Airway Equipment and Method: Stylet Placement Confirmation: ETT inserted through vocal cords under direct vision,  CO2 detector,  breath sounds checked- equal and bilateral and positive ETCO2 Secured at: 21 cm Tube secured with: Tape Dental Injury: Teeth and Oropharynx as per pre-operative assessment

## 2017-09-18 NOTE — Anesthesia Procedure Notes (Signed)
Central Venous Catheter Insertion Performed by: Effie Berkshire, MD, anesthesiologist Start/End6/27/2019 7:25 AM, 09/18/2017 7:30 AM Patient location: Pre-op. Preanesthetic checklist: patient identified, IV checked, site marked, risks and benefits discussed, surgical consent, monitors and equipment checked, pre-op evaluation, timeout performed and anesthesia consent Hand hygiene performed  and maximum sterile barriers used  PA cath was placed.Swan type:thermodilution Procedure performed without using ultrasound guided technique. Attempts: 1 Patient tolerated the procedure well with no immediate complications.

## 2017-09-18 NOTE — Progress Notes (Signed)
No cuff leak noted. RN aware and at bedside as well.

## 2017-09-18 NOTE — Progress Notes (Signed)
Patient unable to pass one of her pulmonary mechanics. Pt is following commands. Pt is able to perform a tight hand squeeze, patient able to life her head off the pillow for over 5 seconds, and patient able to wiggle her toes all w/o complications.   Pulmonary Mechanics Results:  NIF: -22 VC: on 4 attempts achieved a value between 400-644ml each time.   Weaning process aborted at this time until further notice and direction from MD. RN aware of results. Patient is placed back on SIMV/PSV RR4 40% tolerating it well at this time.

## 2017-09-18 NOTE — Interval H&P Note (Signed)
History and Physical Interval Note:  09/18/2017 7:59 AM  Karen Tate  has presented today for surgery, with the diagnosis of CAD  The various methods of treatment have been discussed with the patient and family. After consideration of risks, benefits and other options for treatment, the patient has consented to  Procedure(s): CORONARY ARTERY BYPASS GRAFTING (CABG) (N/A) TRANSESOPHAGEAL ECHOCARDIOGRAM (TEE) (N/A) as a surgical intervention .  The patient's history has been reviewed, patient examined, no change in status, stable for surgery.  I have reviewed the patient's chart and labs.  Questions were answered to the patient's satisfaction.     [name]

## 2017-09-18 NOTE — Plan of Care (Signed)
  Problem: Elimination: Goal: Will not experience complications related to urinary retention Outcome: Progressing   Problem: Cardiac: Goal: Ability to achieve and maintain adequate cardiovascular perfusion will improve Outcome: Progressing   Problem: Cardiac: Goal: Hemodynamic stability will improve Outcome: Progressing   Problem: Respiratory: Goal: Respiratory status will improve Outcome: Progressing

## 2017-09-18 NOTE — Progress Notes (Addendum)
Called anesthesia to ensure that they are on standby when we extubate. They will call back soon to let us know that they have someone to come to the bedside.   2130: CRNA Mateo Flow at bedside during extubation. Patient stable throughout procedure.

## 2017-09-18 NOTE — Progress Notes (Signed)
Repeated pulmonary mechanics results are:   NIF: -32 VC: on two attempts 700-776ml with good effort.   RN aware of results.

## 2017-09-18 NOTE — Progress Notes (Signed)
      [address]       [place] [address]             [phone]      Arrived back in ICU Intubated and sedated In SR at 90 with BP 110/65 PA 38/23 with index 1.6 ECG shows ST elevation in inferior and anterior leads + rub on exam No arrhythmias or instability to suggest STEMi IMA was a good conduit but had some spasm intraop Will put on NTG and observe for now  [place] C. Roxan Hockey, MD [facility] [phone]

## 2017-09-18 NOTE — Progress Notes (Signed)
MD Roxan Hockey informed of No audible cuff leak, ordered to proceed with extubation but to have anesthesia on standby if needed. Will extubate to cool mist aerosol therapy via mask. RRT will monitor patient clinical status.

## 2017-09-18 NOTE — Anesthesia Procedure Notes (Signed)
Arterial Line Insertion Start/End6/27/2019 7:00 AM, 09/18/2017 7:06 AM Performed by: Wilburn Cornelia, CRNA, CRNA  Patient location: Pre-op. Preanesthetic checklist: patient identified, IV checked, site marked, risks and benefits discussed, surgical consent, monitors and equipment checked, pre-op evaluation and timeout performed Lidocaine 1% used for infiltration and patient sedated Left, radial was placed Catheter size: 20 G Hand hygiene performed  and maximum sterile barriers used  Allen's test indicative of satisfactory collateral circulation Attempts: 1 Procedure performed without using ultrasound guided technique. Following insertion, Biopatch. Post procedure assessment: normal  Patient tolerated the procedure well with no immediate complications.

## 2017-09-18 NOTE — Procedures (Signed)
Extubation Procedure Note  Patient Details:   Name: Karen Tate DOB: Jun 14, 1945 MRN: [id number]   Airway Documentation:    Vent end date: 09/18/17 Vent end time: 2128   Evaluation  O2 sats: stable throughout Complications: No apparent complications Patient did tolerate procedure well. Bilateral Breath Sounds: Clear, Diminished  Patient able to sleep post extubation: Yes  Extubation procedure clearly explained to the patient. Patient nodded her head for understanding. Pt was extubated to cool mist aerosol therapy due to no audible cuff leak. MD aware of no cuff leak but wanted to proceed with extubation. Pt tolerated procedure well. No complications noted.   Leigh Aurora, B.S, RRT, RCP 09/18/2017, 9:33 PM

## 2017-09-19 ENCOUNTER — Inpatient Hospital Stay (HOSPITAL_COMMUNITY): Payer: Medicare Other

## 2017-09-19 ENCOUNTER — Encounter (HOSPITAL_COMMUNITY): Payer: Self-pay | Admitting: Thoracic Surgery (Cardiothoracic Vascular Surgery)

## 2017-09-19 DIAGNOSIS — E1121 Type 2 diabetes mellitus with diabetic nephropathy: Secondary | ICD-10-CM | POA: Diagnosis not present

## 2017-09-19 DIAGNOSIS — I10 Essential (primary) hypertension: Secondary | ICD-10-CM | POA: Diagnosis not present

## 2017-09-19 LAB — GLUCOSE, CAPILLARY
Glucose-Capillary: 101 mg/dL — ABNORMAL HIGH (ref 70–99)
Glucose-Capillary: 104 mg/dL — ABNORMAL HIGH (ref 70–99)
Glucose-Capillary: 104 mg/dL — ABNORMAL HIGH (ref 70–99)
Glucose-Capillary: 111 mg/dL — ABNORMAL HIGH (ref 70–99)
Glucose-Capillary: 111 mg/dL — ABNORMAL HIGH (ref 70–99)
Glucose-Capillary: 113 mg/dL — ABNORMAL HIGH (ref 70–99)
Glucose-Capillary: 114 mg/dL — ABNORMAL HIGH (ref 70–99)
Glucose-Capillary: 118 mg/dL — ABNORMAL HIGH (ref 70–99)
Glucose-Capillary: 120 mg/dL — ABNORMAL HIGH (ref 70–99)
Glucose-Capillary: 123 mg/dL — ABNORMAL HIGH (ref 70–99)
Glucose-Capillary: 128 mg/dL — ABNORMAL HIGH (ref 70–99)
Glucose-Capillary: 136 mg/dL — ABNORMAL HIGH (ref 70–99)
Glucose-Capillary: 144 mg/dL — ABNORMAL HIGH (ref 70–99)
Glucose-Capillary: 150 mg/dL — ABNORMAL HIGH (ref 70–99)
Glucose-Capillary: 168 mg/dL — ABNORMAL HIGH (ref 70–99)
Glucose-Capillary: 207 mg/dL — ABNORMAL HIGH (ref 70–99)
Glucose-Capillary: 79 mg/dL (ref 70–99)

## 2017-09-19 LAB — TYPE AND SCREEN
ABO/RH(D): A POS
Antibody Screen: NEGATIVE
Unit division: 0
Unit division: 0

## 2017-09-19 LAB — BASIC METABOLIC PANEL
Anion gap: 8 (ref 5–15)
BUN: 15 mg/dL (ref 8–23)
CO2: 22 mmol/L (ref 22–32)
Calcium: 8.1 mg/dL — ABNORMAL LOW (ref 8.9–10.3)
Chloride: 108 mmol/L (ref 98–111)
Creatinine, Ser: 0.99 mg/dL (ref 0.44–1.00)
GFR calc Af Amer: >60 mL/min (ref >60)
GFR calc non Af Amer: 55 mL/min — ABNORMAL LOW (ref >60)
Glucose, Bld: 111 mg/dL — ABNORMAL HIGH (ref 70–99)
Potassium: 4.3 mmol/L (ref 3.5–5.1)
Sodium: 138 mmol/L (ref 135–145)

## 2017-09-19 LAB — CREATININE, SERUM
Creatinine, Ser: 1.22 mg/dL — ABNORMAL HIGH (ref 0.44–1.00)
GFR calc Af Amer: 50 mL/min — ABNORMAL LOW (ref >60)
GFR calc non Af Amer: 43 mL/min — ABNORMAL LOW (ref >60)

## 2017-09-19 LAB — BPAM RBC
Blood Product Expiration Date: 201907212359
Blood Product Expiration Date: 201907212359
ISSUE DATE / TIME: 201906270720
ISSUE DATE / TIME: 201906270720
Unit Type and Rh: 6200
Unit Type and Rh: 6200

## 2017-09-19 LAB — CBC
HCT: 33.2 % — ABNORMAL LOW (ref 36.0–46.0)
HCT: 30.4 % — ABNORMAL LOW (ref 36.0–46.0)
Hemoglobin: 10.8 g/dL — ABNORMAL LOW (ref 12.0–15.0)
Hemoglobin: 9.7 g/dL — ABNORMAL LOW (ref 12.0–15.0)
MCH: 29.8 pg (ref 26.0–34.0)
MCH: 29.4 pg (ref 26.0–34.0)
MCHC: 32.5 g/dL (ref 30.0–36.0)
MCHC: 31.9 g/dL (ref 30.0–36.0)
MCV: 91.7 fL (ref 78.0–100.0)
MCV: 92.1 fL (ref 78.0–100.0)
Platelets: 171 10*3/uL (ref 150–400)
Platelets: 162 10*3/uL (ref 150–400)
RBC: 3.62 MIL/uL — ABNORMAL LOW (ref 3.87–5.11)
RBC: 3.3 MIL/uL — ABNORMAL LOW (ref 3.87–5.11)
RDW: 15.6 % — ABNORMAL HIGH (ref 11.5–15.5)
RDW: 15.8 % — ABNORMAL HIGH (ref 11.5–15.5)
WBC: 9.8 10*3/uL (ref 4.0–10.5)
WBC: 11.8 10*3/uL — ABNORMAL HIGH (ref 4.0–10.5)

## 2017-09-19 LAB — POCT I-STAT 3, ART BLOOD GAS (G3+)
Acid-base deficit: 5 mmol/L — ABNORMAL HIGH (ref 0.0–2.0)
Bicarbonate: 20.5 mmol/L (ref 20.0–28.0)
O2 Saturation: 97 %
Patient temperature: 36.9
TCO2: 22 mmol/L (ref 22–32)
pCO2 arterial: 38.8 mmHg (ref 32.0–48.0)
pH, Arterial: 7.33 — ABNORMAL LOW (ref 7.350–7.450)
pO2, Arterial: 96 mmHg (ref 83.0–108.0)

## 2017-09-19 LAB — MAGNESIUM
Magnesium: 2.5 mg/dL — ABNORMAL HIGH (ref 1.7–2.4)
Magnesium: 2.2 mg/dL (ref 1.7–2.4)

## 2017-09-19 MED ORDER — METOPROLOL TARTRATE 25 MG PO TABS
25.0000 mg | ORAL_TABLET | Freq: Two times a day (BID) | ORAL | Status: DC
  Administered 2017-09-19 – 2017-09-21 (×6): 25 mg via ORAL
  Filled 2017-09-19 (×6): qty 1

## 2017-09-19 MED ORDER — GLIPIZIDE 5 MG PO TABS
5.0000 mg | ORAL_TABLET | Freq: Two times a day (BID) | ORAL | Status: DC
  Administered 2017-09-19 – 2017-09-25 (×12): 5 mg via ORAL
  Filled 2017-09-19 (×14): qty 1

## 2017-09-19 MED ORDER — ENOXAPARIN SODIUM 40 MG/0.4ML ~~LOC~~ SOLN
40.0000 mg | Freq: Every day | SUBCUTANEOUS | Status: DC
  Administered 2017-09-19 – 2017-09-23 (×5): 40 mg via SUBCUTANEOUS
  Filled 2017-09-19 (×5): qty 0.4

## 2017-09-19 MED ORDER — INSULIN DETEMIR 100 UNIT/ML ~~LOC~~ SOLN
40.0000 [IU] | Freq: Two times a day (BID) | SUBCUTANEOUS | Status: DC
  Administered 2017-09-19 – 2017-09-21 (×5): 40 [IU] via SUBCUTANEOUS
  Filled 2017-09-19 (×6): qty 0.4

## 2017-09-19 MED ORDER — INSULIN ASPART 100 UNIT/ML ~~LOC~~ SOLN
0.0000 [IU] | SUBCUTANEOUS | Status: DC
  Administered 2017-09-19: 4 [IU] via SUBCUTANEOUS
  Administered 2017-09-19: 8 [IU] via SUBCUTANEOUS
  Administered 2017-09-19 – 2017-09-20 (×3): 2 [IU] via SUBCUTANEOUS
  Administered 2017-09-20 (×2): 4 [IU] via SUBCUTANEOUS
  Administered 2017-09-20 – 2017-09-21 (×3): 2 [IU] via SUBCUTANEOUS

## 2017-09-19 MED ORDER — METFORMIN HCL 500 MG PO TABS
1000.0000 mg | ORAL_TABLET | Freq: Two times a day (BID) | ORAL | Status: DC
  Administered 2017-09-19 – 2017-09-25 (×12): 1000 mg via ORAL
  Filled 2017-09-19 (×13): qty 2

## 2017-09-19 MED ORDER — METOPROLOL TARTRATE 25 MG/10 ML ORAL SUSPENSION: 25.0000 mg | Freq: Two times a day (BID) | ORAL | Status: DC

## 2017-09-19 MED ORDER — AMLODIPINE BESYLATE 10 MG PO TABS
10.0000 mg | ORAL_TABLET | Freq: Every day | ORAL | Status: DC
  Administered 2017-09-19 – 2017-09-25 (×7): 10 mg via ORAL
  Filled 2017-09-19 (×3): qty 1
  Filled 2017-09-19: qty 2
  Filled 2017-09-19 (×3): qty 1

## 2017-09-19 MED ORDER — FUROSEMIDE 10 MG/ML IJ SOLN
40.0000 mg | Freq: Once | INTRAMUSCULAR | Status: AC
  Administered 2017-09-19: 40 mg via INTRAVENOUS
  Filled 2017-09-19: qty 4

## 2017-09-19 MED ORDER — POTASSIUM CHLORIDE 10 MEQ/50ML IV SOLN
10.0000 meq | INTRAVENOUS | Status: DC
  Filled 2017-09-19: qty 50

## 2017-09-19 MED ORDER — ASPIRIN EC 81 MG PO TBEC
81.0000 mg | DELAYED_RELEASE_TABLET | Freq: Every day | ORAL | Status: DC
  Administered 2017-09-19 – 2017-09-21 (×3): 81 mg via ORAL
  Filled 2017-09-19 (×3): qty 1

## 2017-09-19 MED ORDER — INSULIN ASPART 100 UNIT/ML ~~LOC~~ SOLN
3.0000 [IU] | Freq: Three times a day (TID) | SUBCUTANEOUS | Status: DC
  Administered 2017-09-20 – 2017-09-24 (×6): 3 [IU] via SUBCUTANEOUS

## 2017-09-19 MED ORDER — HYDROCHLOROTHIAZIDE 25 MG PO TABS
25.0000 mg | ORAL_TABLET | Freq: Every day | ORAL | Status: DC
  Administered 2017-09-19 – 2017-09-25 (×7): 25 mg via ORAL
  Filled 2017-09-19 (×7): qty 1

## 2017-09-19 MED ORDER — HYDRALAZINE HCL 20 MG/ML IJ SOLN
10.0000 mg | Freq: Four times a day (QID) | INTRAMUSCULAR | Status: DC | PRN
  Administered 2017-09-19: 10 mg via INTRAVENOUS
  Filled 2017-09-19: qty 1

## 2017-09-19 MED FILL — Electrolyte-R (PH 7.4) Solution: INTRAVENOUS | Qty: 4000 | Status: AC

## 2017-09-19 MED FILL — Heparin Sodium (Porcine) Inj 1000 Unit/ML: INTRAMUSCULAR | Qty: 10 | Status: AC

## 2017-09-19 MED FILL — Sodium Chloride IV Soln 0.9%: INTRAVENOUS | Qty: 2000 | Status: AC

## 2017-09-19 MED FILL — Magnesium Sulfate Inj 50%: INTRAMUSCULAR | Qty: 10 | Status: AC

## 2017-09-19 MED FILL — Heparin Sodium (Porcine) Inj 1000 Unit/ML: INTRAMUSCULAR | Qty: 30 | Status: AC

## 2017-09-19 MED FILL — Mannitol IV Soln 20%: INTRAVENOUS | Qty: 500 | Status: AC

## 2017-09-19 MED FILL — Sodium Bicarbonate IV Soln 8.4%: INTRAVENOUS | Qty: 50 | Status: AC

## 2017-09-19 MED FILL — Potassium Chloride Inj 2 mEq/ML: INTRAVENOUS | Qty: 40 | Status: AC

## 2017-09-19 MED FILL — Albumin, Human Inj 5%: INTRAVENOUS | Qty: 250 | Status: AC

## 2017-09-19 MED FILL — Lidocaine HCl Local Soln Prefilled Syringe 100 MG/5ML (2%): INTRAMUSCULAR | Qty: 5 | Status: AC

## 2017-09-19 NOTE — Progress Notes (Signed)
1 Day Post-Op Procedure(s) (LRB): CORONARY ARTERY BYPASS GRAFTING (CABG) (N/A) TRANSESOPHAGEAL ECHOCARDIOGRAM (TEE) (N/A) Subjective: Some incisional pain, nausea earlier  Objective: Vital signs in last 24 hours: Temp:  [96.1 F (35.6 C)-99.1 F (37.3 C)] 98.8 F (37.1 C) (06/28 0700) Pulse Rate:  [84-98] 88 (06/28 0400) Cardiac Rhythm: Normal sinus rhythm;Atrial paced (06/28 0400) Resp:  [11-33] 23 (06/28 0700) BP: (95-144)/(55-81) 132/75 (06/28 0700) SpO2:  [90 %-100 %] 97 % (06/28 0700) Arterial Line BP: (105-301)/(25-88) 164/65 (06/28 0700) FiO2 (%):  [28 %-50 %] 28 % (06/27 2128) Weight:  [211 lb 11.2 oz (96 kg)] 211 lb 11.2 oz (96 kg) (06/28 0545)  Hemodynamic parameters for last 24 hours: PAP: (26-65)/(15-44) 32/15 CO:  [2.8 L/min-4.4 L/min] 3.5 L/min CI:  [1.4 L/min/m2-2.2 L/min/m2] 1.8 L/min/m2  Intake/Output from previous day: 06/27 0701 - 06/28 0700 In: 5311.8 [I.V.:3426.6; Blood:635; IV Piggyback:1250.2] Out: 2750 [Urine:1425; Drains:15; Blood:1000; Chest Tube:310] Intake/Output this shift: No intake/output data recorded.  General appearance: alert, cooperative and no distress Neurologic: intact Heart: regular rate and rhythm and friction rub heard markedly less prominent Lungs: diminished breath sounds bibasilar Abdomen: normal findings: soft, non-tender  Lab Results: Recent Labs    09/18/17 1927 09/18/17 1932 09/19/17 0354  WBC 10.7*  --  9.8  HGB 11.0* 11.2* 10.8*  HCT 34.7* 33.0* 33.2*  PLT 168  --  171   BMET:  Recent Labs    09/18/17 1932 09/19/17 0354  NA 139 138  K 4.6 4.3  CL 107 108  CO2  --  22  GLUCOSE 176* 111*  BUN 16 15  CREATININE 0.90 0.99  CALCIUM  --  8.1*    PT/INR:  Recent Labs    09/18/17 1339  LABPROT 16.4*  INR 1.33   ABG    Component Value Date/Time   PHART 7.317 (L) 09/18/2017 1928   HCO3 21.2 09/18/2017 1928   TCO2 22 09/18/2017 1932   ACIDBASEDEF 5.0 (H) 09/18/2017 1928   O2SAT 98.0 09/18/2017 1928    CBG (last 3)  Recent Labs    09/19/17 0457 09/19/17 0557 09/19/17 0655  GLUCAP 111* 120* 114*    Assessment/Plan: S/P Procedure(s) (LRB): CORONARY ARTERY BYPASS GRAFTING (CABG) (N/A) TRANSESOPHAGEAL ECHOCARDIOGRAM (TEE) (N/A) -CV- s/p CABG x 3 Index OK- dc Swan and A line  Hypertension requiring high dose NTG- increase beta blocker- has had issues with bradycardia in the past. Has pacing wires in place. Resume HCTZ and Norvasc. 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 Currently on 7 U/ hr, was as high as 13/ hr overnight  Restart glucotrol and metformin  Transition to Levemir + SSI  GI- some nausea, advance diet as tolerated  Anemia- acute secondary to ABL on chronic- better after transfusion  SCD + enoxaparin for DVT prophylaxis   [name] 09/19/2017

## 2017-09-19 NOTE — Progress Notes (Signed)
Patient ID: Karen Tate, female   DOB: 05/18/1945, 72 y.o.   MRN: [id number] EVENING ROUNDS NOTE :     [address]       [place] [address]             [phone]                 1 Day Post-Op Procedure(s) (LRB): CORONARY ARTERY BYPASS GRAFTING (CABG) (N/A) TRANSESOPHAGEAL ECHOCARDIOGRAM (TEE) (N/A)  Total Length of Stay:  LOS: 7 days  BP 129/62   Pulse 88   Temp 98 F (36.7 C) (Oral)   Resp (!) 38   Ht 5\' 5"  (1.651 m)   Wt 211 lb 11.2 oz (96 kg)   SpO2 96%   BMI 35.23 kg/m   .Intake/Output      06/28 0701 - 06/29 0700   P.O. 150   I.V. (mL/kg) 485.5 (5.1)   Blood    IV Piggyback 100   Total Intake(mL/kg) 735.5 (7.7)   Urine (mL/kg/hr) 2000 (1.3)   Drains 25   Blood    Chest Tube 70   Total Output 2095   Net -1359.5         . sodium chloride Stopped (09/19/17 0800)  . sodium chloride    . sodium chloride 20 mL/hr at 09/18/17 1320  . cefUROXime (ZINACEF)  IV 1.5 g (09/19/17 1938)  . dexmedetomidine (PRECEDEX) IV infusion Stopped (09/18/17 1545)  . insulin (NOVOLIN-R) infusion 2 Units/hr (09/19/17 1149)  . lactated ringers    . lactated ringers    . lactated ringers Stopped (09/19/17 2135)  . nitroGLYCERIN 30 mcg/min (09/19/17 2200)  . phenylephrine (NEO-SYNEPHRINE) Adult infusion Stopped (09/18/17 2132)     Lab Results  Component Value Date   WBC 11.8 (H) 09/19/2017   HGB 9.7 (L) 09/19/2017   HCT 30.4 (L) 09/19/2017   PLT 162 09/19/2017   GLUCOSE 111 (H) 09/19/2017   CHOL 152 09/13/2017   TRIG 209 (H) 09/13/2017   HDL 35 (L) 09/13/2017   LDLCALC 75 09/13/2017   ALT 23 09/13/2017   AST 32 09/13/2017   NA 138 09/19/2017   K 4.3 09/19/2017   CL 108 09/19/2017   CREATININE 1.22 (H) 09/19/2017   BUN 15 09/19/2017   CO2 22 09/19/2017   TSH 1.064 09/12/2017   INR 1.33 09/18/2017   HGBA1C 7.4 (H) 09/12/2017    Stable day  [name] MD  Beeper [phone] Office [phone] 09/19/2017 10:43 PM

## 2017-09-19 NOTE — Progress Notes (Signed)
2 mg morphine wasted with Annice Pih, RN.

## 2017-09-20 ENCOUNTER — Inpatient Hospital Stay (HOSPITAL_COMMUNITY): Payer: Medicare Other

## 2017-09-20 LAB — CBC
HCT: 30.1 % — ABNORMAL LOW (ref 36.0–46.0)
Hemoglobin: 9.3 g/dL — ABNORMAL LOW (ref 12.0–15.0)
MCH: 29.2 pg (ref 26.0–34.0)
MCHC: 30.9 g/dL (ref 30.0–36.0)
MCV: 94.4 fL (ref 78.0–100.0)
Platelets: 178 10*3/uL (ref 150–400)
RBC: 3.19 MIL/uL — ABNORMAL LOW (ref 3.87–5.11)
RDW: 15.9 % — ABNORMAL HIGH (ref 11.5–15.5)
WBC: 12.4 10*3/uL — ABNORMAL HIGH (ref 4.0–10.5)

## 2017-09-20 LAB — POCT I-STAT, CHEM 8
BUN: 19 mg/dL (ref 8–23)
Calcium, Ion: 1.16 mmol/L (ref 1.15–1.40)
Chloride: 102 mmol/L (ref 98–111)
Creatinine, Ser: 1.1 mg/dL — ABNORMAL HIGH (ref 0.44–1.00)
Glucose, Bld: 207 mg/dL — ABNORMAL HIGH (ref 70–99)
HCT: 29 % — ABNORMAL LOW (ref 36.0–46.0)
Hemoglobin: 9.9 g/dL — ABNORMAL LOW (ref 12.0–15.0)
Potassium: 3.8 mmol/L (ref 3.5–5.1)
Sodium: 136 mmol/L (ref 135–145)
TCO2: 21 mmol/L — ABNORMAL LOW (ref 22–32)

## 2017-09-20 LAB — GLUCOSE, CAPILLARY
Glucose-Capillary: 159 mg/dL — ABNORMAL HIGH (ref 70–99)
Glucose-Capillary: 173 mg/dL — ABNORMAL HIGH (ref 70–99)
Glucose-Capillary: 154 mg/dL — ABNORMAL HIGH (ref 70–99)
Glucose-Capillary: 176 mg/dL — ABNORMAL HIGH (ref 70–99)
Glucose-Capillary: 128 mg/dL — ABNORMAL HIGH (ref 70–99)

## 2017-09-20 LAB — BASIC METABOLIC PANEL
Anion gap: 9 (ref 5–15)
BUN: 21 mg/dL (ref 8–23)
CO2: 25 mmol/L (ref 22–32)
Calcium: 8.3 mg/dL — ABNORMAL LOW (ref 8.9–10.3)
Chloride: 102 mmol/L (ref 98–111)
Creatinine, Ser: 1.19 mg/dL — ABNORMAL HIGH (ref 0.44–1.00)
GFR calc Af Amer: 51 mL/min — ABNORMAL LOW (ref >60)
GFR calc non Af Amer: 44 mL/min — ABNORMAL LOW (ref >60)
Glucose, Bld: 176 mg/dL — ABNORMAL HIGH (ref 70–99)
Potassium: 4.8 mmol/L (ref 3.5–5.1)
Sodium: 136 mmol/L (ref 135–145)

## 2017-09-20 MED ORDER — FUROSEMIDE 10 MG/ML IJ SOLN
20.0000 mg | Freq: Two times a day (BID) | INTRAMUSCULAR | Status: AC
Start: 2017-09-20 — End: 2017-09-20
  Administered 2017-09-20 (×2): 20 mg via INTRAVENOUS
  Filled 2017-09-20 (×2): qty 2

## 2017-09-20 MED ORDER — POTASSIUM CHLORIDE 10 MEQ/50ML IV SOLN
10.0000 meq | INTRAVENOUS | Status: AC
  Administered 2017-09-20 (×2): 10 meq via INTRAVENOUS

## 2017-09-20 NOTE — Anesthesia Postprocedure Evaluation (Signed)
Anesthesia Post Note  Patient: Karen Tate  Procedure(s) Performed: CORONARY ARTERY BYPASS GRAFTING (CABG) (N/A Chest) TRANSESOPHAGEAL ECHOCARDIOGRAM (TEE) (N/A )     Patient location during evaluation: SICU Anesthesia Type: General Level of consciousness: awake and alert Pain management: pain level controlled Vital Signs Assessment: post-procedure vital signs reviewed and stable Respiratory status: spontaneous breathing Cardiovascular status: stable Anesthetic complications: no Comments: Pt doing well. Family in room. Pain well controlled. Pressors off. Occasional nausea.     Last Vitals:  Vitals:   09/20/17 0900 09/20/17 1000  BP: 128/74 (!) 141/72  Pulse:    Resp: (!) 24 (!) 28  Temp:    SpO2: 96% 96%    Last Pain:  Vitals:   09/20/17 0725  TempSrc: Oral  PainSc:                  Effie Berkshire

## 2017-09-20 NOTE — Progress Notes (Signed)
Patient ID: Karen Tate, female   DOB: Feb 03, 1946, 72 y.o.   MRN: [id number] TCTS DAILY ICU PROGRESS NOTE                   [address]            [place] [address]          [phone]   2 Days Post-Op Procedure(s) (LRB): CORONARY ARTERY BYPASS GRAFTING (CABG) (N/A) TRANSESOPHAGEAL ECHOCARDIOGRAM (TEE) (N/A)  Total Length of Stay:  LOS: 8 days   Subjective: Stable awake and alert, ambulated short distance yesterday with help  Objective: Vital signs in last 24 hours: Temp:  [97.9 F (36.6 C)-98.3 F (36.8 C)] 98.1 F (36.7 C) (06/29 0725) Cardiac Rhythm: Normal sinus rhythm (06/28 2000) Resp:  [19-44] 21 (06/29 0700) BP: (113-156)/(60-81) 129/62 (06/28 2000) SpO2:  [95 %-98 %] 95 % (06/29 0700) Arterial Line BP: (110-184)/(47-69) 147/61 (06/29 0700) Weight:  [211 lb 6.7 oz (95.9 kg)] 211 lb 6.7 oz (95.9 kg) (06/29 0500)  Filed Weights   09/18/17 0534 09/19/17 0545 09/20/17 0500  Weight: 195 lb 1.6 oz (88.5 kg) 211 lb 11.2 oz (96 kg) 211 lb 6.7 oz (95.9 kg)    Weight change: -4.5 oz (-0.126 kg)   Hemodynamic parameters for last 24 hours: PAP: (29)/(11) 29/11  Intake/Output from previous day: 06/28 0701 - 06/29 0700 In: 1010.5 [P.O.:150; I.V.:660.5; IV Piggyback:200] Out: 2445 [Urine:2325; Drains:50; Chest Tube:70]  Intake/Output this shift: No intake/output data recorded.  Current Meds: Scheduled Meds: . acetaminophen  1,000 mg Oral Q6H   Or  . acetaminophen (TYLENOL) oral liquid 160 mg/5 mL  1,000 mg Per Tube Q6H  . ALPRAZolam  0.5 mg Oral BID  . amLODipine  10 mg Oral Daily  . aspirin EC  81 mg Oral Daily  . atorvastatin  80 mg Oral q1800  . bisacodyl  10 mg Oral Daily   Or  . bisacodyl  10 mg Rectal Daily  . Chlorhexidine Gluconate Cloth  6 each Topical Daily  . docusate sodium  200 mg Oral Daily  . enoxaparin (LOVENOX) injection  40 mg Subcutaneous QHS  . glipiZIDE  5 mg Oral BID AC  . hydrochlorothiazide  25 mg Oral Daily  .  insulin aspart  0-24 Units Subcutaneous Q4H  . insulin aspart  3 Units Subcutaneous TID WC  . insulin detemir  40 Units Subcutaneous BID  . mouth rinse  15 mL Mouth Rinse BID  . metFORMIN  1,000 mg Oral BID WC  . metoCLOPramide (REGLAN) injection  10 mg Intravenous Q6H  . metoprolol tartrate  25 mg Oral BID   Or  . metoprolol tartrate  25 mg Per Tube BID  . omega-3 acid ethyl esters  1 g Oral TID  . pantoprazole  40 mg Oral Daily  . sodium chloride flush  10-40 mL Intracatheter Q12H   Continuous Infusions: . sodium chloride Stopped (09/19/17 0800)  . sodium chloride    . sodium chloride 20 mL/hr at 09/18/17 1320  . cefUROXime (ZINACEF)  IV 1.5 g (09/19/17 1938)  . dexmedetomidine (PRECEDEX) IV infusion Stopped (09/18/17 1545)  . insulin (NOVOLIN-R) infusion 2 Units/hr (09/19/17 1149)  . lactated ringers    . lactated ringers    . lactated ringers 20 mL/hr at 09/20/17 0500  . nitroGLYCERIN 10 mcg/min (09/20/17 0700)  . phenylephrine (NEO-SYNEPHRINE) Adult infusion Stopped (09/18/17 2132)   PRN Meds:.sodium chloride, ALPRAZolam, hydrALAZINE, lactated ringers, meclizine, metoprolol tartrate, midazolam,  morphine injection, ondansetron (ZOFRAN) IV, oxyCODONE, sodium chloride flush, tiZANidine, traMADol  General appearance: alert and cooperative Neurologic: intact Heart: regular rate and rhythm, S1, S2 normal, no murmur, click, rub or gallop Lungs: diminished breath sounds bibasilar Abdomen: soft, non-tender; bowel sounds normal; no masses,  no organomegaly Extremities: extremities normal, atraumatic, no cyanosis or edema Wound: Sternum stable  Lab Results: CBC: Recent Labs    09/19/17 1902 09/19/17 1909 09/20/17 0500  WBC 11.8*  --  12.4*  HGB 9.7* 9.9* 9.3*  HCT 30.4* 29.0* 30.1*  PLT 162  --  178   BMET:  Recent Labs    09/19/17 0354  09/19/17 1909 09/20/17 0500  NA 138  --  136 136  K 4.3  --  3.8 4.8  CL 108  --  102 102  CO2 22  --   --  25  GLUCOSE 111*  --   207* 176*  BUN 15  --  19 21  CREATININE 0.99   < > 1.10* 1.19*  CALCIUM 8.1*  --   --  8.3*   < > = values in this interval not displayed.    CMET: Lab Results  Component Value Date   WBC 12.4 (H) 09/20/2017   HGB 9.3 (L) 09/20/2017   HCT 30.1 (L) 09/20/2017   PLT 178 09/20/2017   GLUCOSE 176 (H) 09/20/2017   CHOL 152 09/13/2017   TRIG 209 (H) 09/13/2017   HDL 35 (L) 09/13/2017   LDLCALC 75 09/13/2017   ALT 23 09/13/2017   AST 32 09/13/2017   NA 136 09/20/2017   K 4.8 09/20/2017   CL 102 09/20/2017   CREATININE 1.19 (H) 09/20/2017   BUN 21 09/20/2017   CO2 25 09/20/2017   TSH 1.064 09/12/2017   INR 1.33 09/18/2017   HGBA1C 7.4 (H) 09/12/2017      PT/INR:  Recent Labs    09/18/17 1339  LABPROT 16.4*  INR 1.33   Radiology: Dg Chest Port 1 View  Result Date: 09/20/2017 CLINICAL DATA:  Patient status post CABG procedure. EXAM: PORTABLE CHEST 1 VIEW COMPARISON:  Chest radiograph 09/19/2017 FINDINGS: Right IJ sheath projects over the superior vena cava. Interval retraction PA catheter. Monitoring leads overlie the patient. Stable prominent cardiac and mediastinal contours status post median sternotomy. Interval removal left chest tube and mediastinal drains. Small left pleural effusion with heterogeneous opacities left lung base. Improved aeration right lung base. No definite pneumothorax identified. IMPRESSION: Small left pleural effusion with left basilar opacities which may represent atelectasis. Electronically Signed   By: Lovey Newcomer M.D.   On: 09/20/2017 07:45     Assessment/Plan: S/P Procedure(s) (LRB): CORONARY ARTERY BYPASS GRAFTING (CABG) (N/A) TRANSESOPHAGEAL ECHOCARDIOGRAM (TEE) (N/A) Mobilize Diuresis Diabetes control Renal function stable    Karen Tate 09/20/2017 8:05 AM

## 2017-09-20 NOTE — Plan of Care (Signed)
  Problem: Education: Goal: Knowledge of General Education information will improve Outcome: Progressing   Problem: Health Behavior/Discharge Planning: Goal: Ability to manage health-related needs will improve Outcome: Progressing   Problem: Clinical Measurements: Goal: Ability to maintain clinical measurements within normal limits will improve Outcome: Progressing Goal: Will remain free from infection Outcome: Progressing Goal: Diagnostic test results will improve Outcome: Progressing Goal: Respiratory complications will improve Outcome: Progressing Goal: Cardiovascular complication will be avoided Outcome: Progressing   Problem: Activity: Goal: Risk for activity intolerance will decrease Outcome: Progressing   Problem: Nutrition: Goal: Adequate nutrition will be maintained Outcome: Progressing   Problem: Coping: Goal: Level of anxiety will decrease Outcome: Progressing   Problem: Elimination: Goal: Will not experience complications related to bowel motility Outcome: Progressing Goal: Will not experience complications related to urinary retention Outcome: Progressing   Problem: Pain Managment: Goal: General experience of comfort will improve Outcome: Progressing   Problem: Safety: Goal: Ability to remain free from injury will improve Outcome: Progressing   Problem: Skin Integrity: Goal: Risk for impaired skin integrity will decrease Outcome: Progressing   Problem: Education: Goal: Understanding of cardiac disease, CV risk reduction, and recovery process will improve Outcome: Progressing   Problem: Activity: Goal: Ability to tolerate increased activity will improve Outcome: Progressing   Problem: Cardiac: Goal: Ability to achieve and maintain adequate cardiovascular perfusion will improve Outcome: Progressing   Problem: Health Behavior/Discharge Planning: Goal: Ability to safely manage health-related needs after discharge will improve Outcome:  Progressing   Problem: Education: Goal: Ability to demonstrate proper wound care will improve Outcome: Progressing Goal: Knowledge of disease or condition will improve Outcome: Progressing Goal: Knowledge of the prescribed therapeutic regimen will improve Outcome: Progressing   Problem: Activity: Goal: Risk for activity intolerance will decrease Outcome: Progressing   Problem: Cardiac: Goal: Hemodynamic stability will improve Outcome: Progressing   Problem: Clinical Measurements: Goal: Postoperative complications will be avoided or minimized Outcome: Progressing   Problem: Respiratory: Goal: Respiratory status will improve Outcome: Progressing   Problem: Skin Integrity: Goal: Wound healing without signs and symptoms of infection Outcome: Progressing Goal: Risk for impaired skin integrity will decrease Outcome: Progressing   Problem: Urinary Elimination: Goal: Ability to achieve and maintain adequate renal perfusion and functioning will improve Outcome: Progressing

## 2017-09-20 NOTE — Progress Notes (Signed)
Patient ID: Karen Tate, female   DOB: [birth year], 72 y.o.   MRN: [id number] EVENING ROUNDS NOTE :     [address]       [place] [address]             [phone]                 2 Days Post-Op Procedure(s) (LRB): CORONARY ARTERY BYPASS GRAFTING (CABG) (N/A) TRANSESOPHAGEAL ECHOCARDIOGRAM (TEE) (N/A)  Total Length of Stay:  LOS: 8 days  BP 139/67   Pulse 92   Temp 97.8 F (36.6 C) (Oral)   Resp (!) 29   Ht 5\' 5"  (1.651 m)   Wt 211 lb 6.7 oz (95.9 kg)   SpO2 96%   BMI 35.18 kg/m   .Intake/Output      06/28 0701 - 06/29 0700 06/29 0701 - 06/30 0700   P.O. 150    I.V. (mL/kg) 660.5 (6.9) 120.9 (1.3)   Blood     Other  50   IV Piggyback 200 276.7   Total Intake(mL/kg) 1010.5 (10.5) 447.6 (4.7)   Urine (mL/kg/hr) 2325 (1) 1650 (1.5)   Drains 50    Blood     Chest Tube 70    Total Output 2445 1650   Net -1434.5 -1202.4          . sodium chloride Stopped (09/19/17 0800)  . sodium chloride    . sodium chloride 10 mL/hr at 09/20/17 1400  . dexmedetomidine (PRECEDEX) IV infusion Stopped (09/18/17 1545)  . lactated ringers    . lactated ringers    . lactated ringers 20 mL/hr at 09/20/17 1000  . nitroGLYCERIN Stopped (09/20/17 0718)  . phenylephrine (NEO-SYNEPHRINE) Adult infusion Stopped (09/18/17 2132)     Lab Results  Component Value Date   WBC 12.4 (H) 09/20/2017   HGB 9.3 (L) 09/20/2017   HCT 30.1 (L) 09/20/2017   PLT 178 09/20/2017   GLUCOSE 176 (H) 09/20/2017   CHOL 152 09/13/2017   TRIG 209 (H) 09/13/2017   HDL 35 (L) 09/13/2017   LDLCALC 75 09/13/2017   ALT 23 09/13/2017   AST 32 09/13/2017   NA 136 09/20/2017   K 4.8 09/20/2017   CL 102 09/20/2017   CREATININE 1.19 (H) 09/20/2017   BUN 21 09/20/2017   CO2 25 09/20/2017   TSH 1.064 09/12/2017   INR 1.33 09/18/2017   HGBA1C 7.4 (H) 09/12/2017   Very slow walking today, sob with effort Sinus diuresis ing now Needs pt    [name] MD  Beeper [phone] Office  [phone] 09/20/2017 6:52 PM

## 2017-09-21 ENCOUNTER — Inpatient Hospital Stay: Payer: Self-pay

## 2017-09-21 LAB — BASIC METABOLIC PANEL
Anion gap: 6 (ref 5–15)
BUN: 21 mg/dL (ref 8–23)
CO2: 29 mmol/L (ref 22–32)
Calcium: 8.4 mg/dL — ABNORMAL LOW (ref 8.9–10.3)
Chloride: 103 mmol/L (ref 98–111)
Creatinine, Ser: 1.1 mg/dL — ABNORMAL HIGH (ref 0.44–1.00)
GFR calc Af Amer: 56 mL/min — ABNORMAL LOW (ref >60)
GFR calc non Af Amer: 49 mL/min — ABNORMAL LOW (ref >60)
Glucose, Bld: 83 mg/dL (ref 70–99)
Potassium: 3.4 mmol/L — ABNORMAL LOW (ref 3.5–5.1)
Sodium: 138 mmol/L (ref 135–145)

## 2017-09-21 LAB — CBC
HCT: 28.5 % — ABNORMAL LOW (ref 36.0–46.0)
Hemoglobin: 9 g/dL — ABNORMAL LOW (ref 12.0–15.0)
MCH: 29.4 pg (ref 26.0–34.0)
MCHC: 31.6 g/dL (ref 30.0–36.0)
MCV: 93.1 fL (ref 78.0–100.0)
Platelets: 181 10*3/uL (ref 150–400)
RBC: 3.06 MIL/uL — ABNORMAL LOW (ref 3.87–5.11)
RDW: 15.4 % (ref 11.5–15.5)
WBC: 10.3 10*3/uL (ref 4.0–10.5)

## 2017-09-21 LAB — GLUCOSE, CAPILLARY
Glucose-Capillary: 108 mg/dL — ABNORMAL HIGH (ref 70–99)
Glucose-Capillary: 116 mg/dL — ABNORMAL HIGH (ref 70–99)
Glucose-Capillary: 116 mg/dL — ABNORMAL HIGH (ref 70–99)
Glucose-Capillary: 157 mg/dL — ABNORMAL HIGH (ref 70–99)
Glucose-Capillary: 157 mg/dL — ABNORMAL HIGH (ref 70–99)
Glucose-Capillary: 38 mg/dL — CL (ref 70–99)
Glucose-Capillary: 48 mg/dL — ABNORMAL LOW (ref 70–99)
Glucose-Capillary: 74 mg/dL (ref 70–99)
Glucose-Capillary: 78 mg/dL (ref 70–99)

## 2017-09-21 LAB — GLUCOSE, RANDOM: Glucose, Bld: 36 mg/dL — CL (ref 70–99)

## 2017-09-21 MED ORDER — POTASSIUM CHLORIDE CRYS ER 20 MEQ PO TBCR
20.0000 meq | EXTENDED_RELEASE_TABLET | Freq: Every day | ORAL | Status: DC
  Administered 2017-09-21 – 2017-09-22 (×2): 20 meq via ORAL
  Filled 2017-09-21 (×2): qty 1

## 2017-09-21 MED ORDER — SODIUM CHLORIDE 0.9% FLUSH: 10.0000 mL | INTRAVENOUS | Status: DC | PRN

## 2017-09-21 MED ORDER — INSULIN DETEMIR 100 UNIT/ML ~~LOC~~ SOLN
20.0000 [IU] | Freq: Two times a day (BID) | SUBCUTANEOUS | Status: DC
  Administered 2017-09-21 – 2017-09-23 (×4): 20 [IU] via SUBCUTANEOUS
  Filled 2017-09-21 (×5): qty 0.2

## 2017-09-21 MED ORDER — CHLORHEXIDINE GLUCONATE CLOTH 2 % EX PADS
6.0000 | MEDICATED_PAD | Freq: Every day | CUTANEOUS | Status: DC
  Administered 2017-09-22 – 2017-09-23 (×2): 6 via TOPICAL

## 2017-09-21 MED ORDER — POTASSIUM CHLORIDE 10 MEQ/50ML IV SOLN
10.0000 meq | INTRAVENOUS | Status: AC
  Administered 2017-09-21: 10 meq via INTRAVENOUS
  Filled 2017-09-21 (×2): qty 50

## 2017-09-21 MED ORDER — FUROSEMIDE 20 MG PO TABS
20.0000 mg | ORAL_TABLET | Freq: Two times a day (BID) | ORAL | Status: DC
  Administered 2017-09-21 – 2017-09-24 (×7): 20 mg via ORAL
  Filled 2017-09-21 (×7): qty 1

## 2017-09-21 NOTE — Progress Notes (Signed)
Patient ID: Karen Tate, female   DOB: [birth year], 72 y.o.   MRN: [id number] EVENING ROUNDS NOTE :     [address]       [place] [address]             [phone]                 3 Days Post-Op Procedure(s) (LRB): CORONARY ARTERY BYPASS GRAFTING (CABG) (N/A) TRANSESOPHAGEAL ECHOCARDIOGRAM (TEE) (N/A)  Total Length of Stay:  LOS: 9 days  BP 133/70   Pulse 92   Temp 97.7 F (36.5 C) (Oral)   Resp (!) 32   Ht 5\' 5"  (1.651 m)   Wt 205 lb 11 oz (93.3 kg)   SpO2 99%   BMI 34.23 kg/m   .Intake/Output      06/29 0701 - 06/30 0700 06/30 0701 - 07/01 0700   P.O.  600   I.V. (mL/kg) 139.4 (1.5) 7.5 (0.1)   Other 170    IV Piggyback 276.7 0   Total Intake(mL/kg) 586.1 (6.3) 607.5 (6.5)   Urine (mL/kg/hr) 3250 (1.5) 475 (0.4)   Drains 40 20   Stool 0 0   Chest Tube     Total Output 3290 495   Net -2703.9 +112.5        Urine Occurrence  4 x   Stool Occurrence 1 x 4 x     . sodium chloride Stopped (09/19/17 0800)  . sodium chloride    . sodium chloride 10 mL/hr at 09/20/17 1400  . dexmedetomidine (PRECEDEX) IV infusion Stopped (09/18/17 1545)  . lactated ringers    . lactated ringers Stopped (09/21/17 0814)  . lactated ringers Stopped (09/21/17 0813)  . nitroGLYCERIN Stopped (09/20/17 0718)  . phenylephrine (NEO-SYNEPHRINE) Adult infusion Stopped (09/18/17 2132)     Lab Results  Component Value Date   WBC 10.3 09/21/2017   HGB 9.0 (L) 09/21/2017   HCT 28.5 (L) 09/21/2017   PLT 181 09/21/2017   GLUCOSE 36 (LL) 09/21/2017   CHOL 152 09/13/2017   TRIG 209 (H) 09/13/2017   HDL 35 (L) 09/13/2017   LDLCALC 75 09/13/2017   ALT 23 09/13/2017   AST 32 09/13/2017   NA 138 09/21/2017   K 3.4 (L) 09/21/2017   CL 103 09/21/2017   CREATININE 1.10 (H) 09/21/2017   BUN 21 09/21/2017   CO2 29 09/21/2017   TSH 1.064 09/12/2017   INR 1.33 09/18/2017   HGBA1C 7.4 (H) 09/12/2017   pic line in, difficult iv access Glucose low, bid insulin dose deceased     [name] MD  Beeper [phone] Office [phone] 09/21/2017 7:00 PM

## 2017-09-21 NOTE — Progress Notes (Signed)
Patient ID: Karen Tate, female   DOB: 06/27/1945, 72 y.o.   MRN: [id number] TCTS DAILY ICU PROGRESS NOTE                   [address]            [place] [address]          [phone]   3 Days Post-Op Procedure(s) (LRB): CORONARY ARTERY BYPASS GRAFTING (CABG) (N/A) TRANSESOPHAGEAL ECHOCARDIOGRAM (TEE) (N/A)  Total Length of Stay:  LOS: 9 days   Subjective: Patient awake alert neurologically intact walked some in the hall this morning little stronger than yesterday but very slow moving  Objective: Vital signs in last 24 hours: Temp:  [97.7 F (36.5 C)-98.2 F (36.8 C)] 98.1 F (36.7 C) (06/30 0726) Pulse Rate:  [92] 92 (06/29 1500) Cardiac Rhythm: Normal sinus rhythm (06/29 2000) Resp:  [17-42] 28 (06/30 0700) BP: (114-149)/(60-77) 127/66 (06/30 0700) SpO2:  [94 %-97 %] 96 % (06/30 0700) Weight:  [205 lb 11 oz (93.3 kg)] 205 lb 11 oz (93.3 kg) (06/30 0500)  Filed Weights   09/19/17 0545 09/20/17 0500 09/21/17 0500  Weight: 211 lb 11.2 oz (96 kg) 211 lb 6.7 oz (95.9 kg) 205 lb 11 oz (93.3 kg)    Weight change: -5 lb 11.7 oz (-2.6 kg)   Hemodynamic parameters for last 24 hours:    Intake/Output from previous day: 06/29 0701 - 06/30 0700 In: 567.6 [I.V.:120.9; IV Piggyback:276.7] Out: 5852 [Urine:3250; Drains:40]  Intake/Output this shift: No intake/output data recorded.  Current Meds: Scheduled Meds: . acetaminophen  1,000 mg Oral Q6H   Or  . acetaminophen (TYLENOL) oral liquid 160 mg/5 mL  1,000 mg Per Tube Q6H  . ALPRAZolam  0.5 mg Oral BID  . amLODipine  10 mg Oral Daily  . aspirin EC  81 mg Oral Daily  . atorvastatin  80 mg Oral q1800  . bisacodyl  10 mg Oral Daily   Or  . bisacodyl  10 mg Rectal Daily  . Chlorhexidine Gluconate Cloth  6 each Topical Daily  . docusate sodium  200 mg Oral Daily  . enoxaparin (LOVENOX) injection  40 mg Subcutaneous QHS  . glipiZIDE  5 mg Oral BID AC  . hydrochlorothiazide  25 mg Oral Daily  .  insulin aspart  0-24 Units Subcutaneous Q4H  . insulin aspart  3 Units Subcutaneous TID WC  . insulin detemir  40 Units Subcutaneous BID  . mouth rinse  15 mL Mouth Rinse BID  . metFORMIN  1,000 mg Oral BID WC  . metoCLOPramide (REGLAN) injection  10 mg Intravenous Q6H  . metoprolol tartrate  25 mg Oral BID   Or  . metoprolol tartrate  25 mg Per Tube BID  . omega-3 acid ethyl esters  1 g Oral TID  . pantoprazole  40 mg Oral Daily  . sodium chloride flush  10-40 mL Intracatheter Q12H   Continuous Infusions: . sodium chloride Stopped (09/19/17 0800)  . sodium chloride    . sodium chloride 10 mL/hr at 09/20/17 1400  . dexmedetomidine (PRECEDEX) IV infusion Stopped (09/18/17 1545)  . lactated ringers    . lactated ringers    . lactated ringers 20 mL/hr at 09/20/17 1000  . nitroGLYCERIN Stopped (09/20/17 0718)  . phenylephrine (NEO-SYNEPHRINE) Adult infusion Stopped (09/18/17 2132)  . potassium chloride 10 mEq (09/21/17 0653)   PRN Meds:.sodium chloride, ALPRAZolam, hydrALAZINE, lactated ringers, meclizine, metoprolol tartrate, midazolam, morphine injection, ondansetron (ZOFRAN)  IV, oxyCODONE, sodium chloride flush, tiZANidine, traMADol  General appearance: alert, cooperative and no distress Neurologic: intact Heart: regular rate and rhythm, S1, S2 normal, no murmur, click, rub or gallop Lungs: diminished breath sounds bibasilar Abdomen: soft, non-tender; bowel sounds normal; no masses,  no organomegaly Extremities: extremities normal, atraumatic, no cyanosis or edema and Homans sign is negative, no sign of DVT Wound: Sternum intact  Lab Results: CBC: Recent Labs    09/20/17 0500 09/21/17 0513  WBC 12.4* 10.3  HGB 9.3* 9.0*  HCT 30.1* 28.5*  PLT 178 181   BMET:  Recent Labs    09/20/17 0500 09/21/17 0513  NA 136 138  K 4.8 3.4*  CL 102 103  CO2 25 29  GLUCOSE 176* 83  BUN 21 21  CREATININE 1.19* 1.10*  CALCIUM 8.3* 8.4*    CMET: Lab Results  Component Value  Date   WBC 10.3 09/21/2017   HGB 9.0 (L) 09/21/2017   HCT 28.5 (L) 09/21/2017   PLT 181 09/21/2017   GLUCOSE 83 09/21/2017   CHOL 152 09/13/2017   TRIG 209 (H) 09/13/2017   HDL 35 (L) 09/13/2017   LDLCALC 75 09/13/2017   ALT 23 09/13/2017   AST 32 09/13/2017   NA 138 09/21/2017   K 3.4 (L) 09/21/2017   CL 103 09/21/2017   CREATININE 1.10 (H) 09/21/2017   BUN 21 09/21/2017   CO2 29 09/21/2017   TSH 1.064 09/12/2017   INR 1.33 09/18/2017   HGBA1C 7.4 (H) 09/12/2017      PT/INR:  Recent Labs    09/18/17 1339  LABPROT 16.4*  INR 1.33   Radiology: No results found.   Assessment/Plan: S/P Procedure(s) (LRB): CORONARY ARTERY BYPASS GRAFTING (CABG) (N/A) TRANSESOPHAGEAL ECHOCARDIOGRAM (TEE) (N/A) Mobilize Diuresis Diabetes control d/c tubes/lines Needs physical therapy for mobility DC Foley and central line, placed back if unable to place IV Renal function stable   [name] 09/21/2017 7:37 AM

## 2017-09-21 NOTE — Progress Notes (Signed)
Spoke with RN re d/c CVC order.  RN aware.

## 2017-09-21 NOTE — Progress Notes (Signed)
IV team attempted 4 times (with the use of the ultrasound machine) to obtain a peripheral IV and was unsuccessful.  MD stated to keep central line since unable to gain peripheral access.

## 2017-09-21 NOTE — Progress Notes (Signed)
At approximately 1145, pt's CBG was 23, it was immediately rechecked and was 18. Pt was completely neuro intact but diaphoretic. Pt drank (2) juices. Pulled labs off central line and glucose was 36.  CBG recheck at 1310 was 48. Pt drank another juice and ate graham crackers and peanut butter.  At 1350 CBG recheck was 78.  Will continue to closely monitor.

## 2017-09-21 NOTE — Progress Notes (Deleted)
Inpatient Diabetes Program Recommendations  AACE/ADA: New Consensus Statement on Inpatient Glycemic Control (2015)  Target Ranges:  Prepandial:   less than 140 mg/dL      Peak postprandial:   less than 180 mg/dL (1-2 hours)      Critically ill patients:  140 - 180 mg/dL   Lab Results  Component Value Date   GLUCAP 157 (H) 09/21/2017   HGBA1C 7.4 (H) 09/12/2017    Review of Glycemic Control  Post-prandial blood sugars elevated. May benefit from addition of meal coverage insulin. Levemir increased to 13 units bid.  Inpatient Diabetes Program Recommendations:     Add Novolog 4 units tidwc for meal coverage insulin if pt eats > 50% meal.  Continue to follow.  Thank you. Lorenda Peck, RD, LDN, CDE Inpatient Diabetes Coordinator [phone]

## 2017-09-21 NOTE — Progress Notes (Signed)
Peripherally Inserted Central Catheter/Midline Placement  The IV Nurse has discussed with the patient and/or persons authorized to consent for the patient, the purpose of this procedure and the potential benefits and risks involved with this procedure.  The benefits include less needle sticks, lab draws from the catheter, and the patient may be discharged home with the catheter. Risks include, but not limited to, infection, bleeding, blood clot (thrombus formation), and puncture of an artery; nerve damage and irregular heartbeat and possibility to perform a PICC exchange if needed/ordered by physician.  Alternatives to this procedure were also discussed.  Bard Power PICC patient education guide, fact sheet on infection prevention and patient information card has been provided to patient /or left at bedside.    PICC/Midline Placement Documentation  PICC Double Lumen 09/21/17 PICC Left Brachial 39 cm 1 cm (Active)  Indication for Insertion or Continuance of Line Prolonged intravenous therapies 09/21/2017  5:00 PM  Exposed Catheter (cm) 1 cm 09/21/2017  5:00 PM  Site Assessment Clean;Dry;Intact 09/21/2017  5:00 PM  Lumen #1 Status Flushed;Blood return noted;Saline locked 09/21/2017  5:00 PM  Lumen #2 Status Flushed;Blood return noted;Saline locked 09/21/2017  5:00 PM  Dressing Type Transparent 09/21/2017  5:00 PM  Dressing Status Clean;Intact;Dry;Antimicrobial disc in place 09/21/2017  5:00 PM  Dressing Change Due 09/29/18 09/21/2017  5:00 PM       [address] 09/21/2017, 5:09 PM

## 2017-09-21 NOTE — Evaluation (Signed)
Physical Therapy Evaluation Patient Details Name: Karen Tate MRN: [id number] DOB: 15-Oct-1945 Today's Date: 09/21/2017   History of Present Illness  Pt is a 72 y.o. 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 PMH includes  HTN, PE, DMII, glaucoma, arthritis, lumbar fusion.  Clinical Impression  Pt presents with an overall decrease in functional mobility secondary to above. PTA, pt mod indep with SPC; lives with husband who is independent, but relies on pt for household tasks. Educ on sternal precautions, positioning, and importance of mobility. Today, pt able to ambulate 200' with RW and min guard for balance, requiring 2x seated rest breaks secondary to fatigue; required modA for bed mobility in order to maintain precautions. Pt motivated to return home. Pt would benefit from continued acute PT services to maximize functional mobility and independence prior to d/c with HHPT services.     Follow Up Recommendations Home health PT;Supervision - Intermittent    Equipment Recommendations  None recommended by PT    Recommendations for Other Services OT consult     Precautions / Restrictions Precautions Precautions: Sternal;Fall Precaution Comments: Verbally reviewed precautions Restrictions Other Position/Activity Restrictions: Sternal precautions      Mobility  Bed Mobility Overal bed mobility: Needs Assistance Bed Mobility: Rolling;Sidelying to Sit Rolling: Min guard Sidelying to sit: Mod assist       General bed mobility comments: Educ on log roll technique. ModA to assist trunk elevation  Transfers Overall transfer level: Needs assistance Equipment used: Rolling walker (2 wheeled) Transfers: Sit to/from Stand Sit to Stand: Min assist;Min guard         General transfer comment: Initial minA to assist trnk elevation standing from bed. Pt stood 3x more from chair with RW and min guard, no physical assist required. Educ on correct technique with hands on knees or to side to maintain  sternal precautions  Ambulation/Gait Ambulation/Gait assistance: Min guard Gait Distance (Feet): 200 Feet Assistive device: Rolling walker (2 wheeled) Gait Pattern/deviations: Step-through pattern;Decreased stride length;Trunk flexed Gait velocity: Decreased Gait velocity interpretation: 1.31 - 2.62 ft/sec, indicative of limited community ambulator General Gait Details: Slow, steady amb with RW and intermittent min guard for balance. 200' total, requiring 2x seated rest breaks secondary to fatigue (50'+50'+100')  Stairs            Wheelchair Mobility    Modified Rankin (Stroke Patients Only)       Balance Overall balance assessment: Needs assistance   Sitting balance-Leahy Scale: Fair       Standing balance-Leahy Scale: Poor Standing balance comment: Reliant on UE support                             Pertinent Vitals/Pain Pain Assessment: No/denies pain    Home Living Family/patient expects to be discharged to:: Private residence Living Arrangements: Spouse/significant other Available Help at Discharge: Family;Available 24 hours/day Type of Home: House Home Access: Stairs to enter   [employer] of Steps: 1 threshold onto porch Home Layout: One level Home Equipment: Environmental consultant - 2 wheels;Walker - 4 wheels;Cane - single point;Shower seat;Hand held shower head      Prior Function Level of Independence: Independent with assistive device(s)         Comments: Mod indep with SPC. Husband indep with ambulation and ADLs, but pt does majority of cooking/cleaning at home. Pt drives. Daughter nearby and can assist when not working     Hand Dominance   Dominant Hand: Right  Extremity/Trunk Assessment   Upper Extremity Assessment Upper Extremity Assessment: Overall WFL for tasks assessed(with parameters of sternal precautions)    Lower Extremity Assessment Lower Extremity Assessment: Overall WFL for tasks assessed       Communication    Communication: No difficulties  Cognition Arousal/Alertness: Awake/alert Behavior During Therapy: WFL for tasks assessed/performed Overall Cognitive Status: Within Functional Limits for tasks assessed                                        General Comments General comments (skin integrity, edema, etc.): SpO2 >92% on RA. Seated BP 163/86, post-amb BP 164/73    Exercises     Assessment/Plan    PT Assessment Patient needs continued PT services  PT Problem List Decreased strength;Decreased activity tolerance;Decreased balance;Decreased mobility;Decreased range of motion;Decreased knowledge of use of DME;Decreased knowledge of precautions       PT Treatment Interventions DME instruction;Gait training;Stair training;Functional mobility training;Therapeutic activities;Therapeutic exercise;Balance training;Patient/family education    PT Goals (Current goals can be found in the Care Plan section)  Acute Rehab PT Goals Patient Stated Goal: Feel safe and strong before returning home PT Goal Formulation: With patient Time For Goal Achievement: 10/05/17 Potential to Achieve Goals: Good    Frequency Min 3X/week   Barriers to discharge Decreased caregiver support      Co-evaluation               AM-PAC PT "6 Clicks" Daily Activity  Outcome Measure Difficulty turning over in bed (including adjusting bedclothes, sheets and blankets)?: A Little Difficulty moving from lying on back to sitting on the side of the bed? : Unable Difficulty sitting down on and standing up from a chair with arms (e.g., wheelchair, bedside commode, etc,.)?: A Little Help needed moving to and from a bed to chair (including a wheelchair)?: A Little Help needed walking in hospital room?: A Little Help needed climbing 3-5 steps with a railing? : A Little 6 Click Score: 16    End of Session Equipment Utilized During Treatment: Gait belt Activity Tolerance: Patient tolerated treatment  well Patient left: in chair;with call bell/phone within reach Nurse Communication: Mobility status PT Visit Diagnosis: Other abnormalities of gait and mobility (R26.89)    Time: 8403-7543 PT Time Calculation (min) (ACUTE ONLY): 28 min   Charges:   PT Evaluation $PT Eval Moderate Complexity: 1 Mod PT Treatments $Gait Training: 8-22 mins   PT G Codes:       Mabeline Caras, PT, DPT Acute Rehab Services  Pager: [place] 09/21/2017, 11:49 AM

## 2017-09-22 ENCOUNTER — Inpatient Hospital Stay (HOSPITAL_COMMUNITY): Payer: Medicare Other

## 2017-09-22 DIAGNOSIS — Z09 Encounter for follow-up examination after completed treatment for conditions other than malignant neoplasm: Secondary | ICD-10-CM

## 2017-09-22 LAB — BASIC METABOLIC PANEL
Anion gap: 9 (ref 5–15)
BUN: 17 mg/dL (ref 8–23)
CO2: 30 mmol/L (ref 22–32)
Calcium: 8.8 mg/dL — ABNORMAL LOW (ref 8.9–10.3)
Chloride: 101 mmol/L (ref 98–111)
Creatinine, Ser: 0.99 mg/dL (ref 0.44–1.00)
GFR calc Af Amer: >60 mL/min (ref >60)
GFR calc non Af Amer: 55 mL/min — ABNORMAL LOW (ref >60)
Glucose, Bld: 84 mg/dL (ref 70–99)
Potassium: 3.4 mmol/L — ABNORMAL LOW (ref 3.5–5.1)
Sodium: 140 mmol/L (ref 135–145)

## 2017-09-22 LAB — GLUCOSE, CAPILLARY
Glucose-Capillary: 23 mg/dL — CL (ref 70–99)
Glucose-Capillary: 81 mg/dL (ref 70–99)
Glucose-Capillary: 111 mg/dL — ABNORMAL HIGH (ref 70–99)
Glucose-Capillary: 139 mg/dL — ABNORMAL HIGH (ref 70–99)
Glucose-Capillary: 103 mg/dL — ABNORMAL HIGH (ref 70–99)
Glucose-Capillary: 96 mg/dL (ref 70–99)
Glucose-Capillary: 118 mg/dL — ABNORMAL HIGH (ref 70–99)

## 2017-09-22 LAB — CBC
HCT: 28.9 % — ABNORMAL LOW (ref 36.0–46.0)
Hemoglobin: 9.1 g/dL — ABNORMAL LOW (ref 12.0–15.0)
MCH: 29.7 pg (ref 26.0–34.0)
MCHC: 31.5 g/dL (ref 30.0–36.0)
MCV: 94.4 fL (ref 78.0–100.0)
Platelets: 213 10*3/uL (ref 150–400)
RBC: 3.06 MIL/uL — ABNORMAL LOW (ref 3.87–5.11)
RDW: 15.3 % (ref 11.5–15.5)
WBC: 7.8 10*3/uL (ref 4.0–10.5)

## 2017-09-22 MED ORDER — AMIODARONE HCL IN DEXTROSE 360-4.14 MG/200ML-% IV SOLN
INTRAVENOUS | Status: AC
  Filled 2017-09-22: qty 200

## 2017-09-22 MED ORDER — DOCUSATE SODIUM 100 MG PO CAPS
200.0000 mg | ORAL_CAPSULE | Freq: Every day | ORAL | Status: DC
  Filled 2017-09-22 (×3): qty 2

## 2017-09-22 MED ORDER — BISACODYL 5 MG PO TBEC: 10.0000 mg | DELAYED_RELEASE_TABLET | Freq: Every day | ORAL | Status: DC | PRN

## 2017-09-22 MED ORDER — SODIUM CHLORIDE 0.9% FLUSH
3.0000 mL | Freq: Two times a day (BID) | INTRAVENOUS | Status: DC
  Administered 2017-09-22 – 2017-09-25 (×7): 3 mL via INTRAVENOUS

## 2017-09-22 MED ORDER — SODIUM CHLORIDE 0.9% FLUSH: 3.0000 mL | INTRAVENOUS | Status: DC | PRN

## 2017-09-22 MED ORDER — ACETAMINOPHEN 325 MG PO TABS: 650.0000 mg | ORAL_TABLET | Freq: Four times a day (QID) | ORAL | Status: DC | PRN

## 2017-09-22 MED ORDER — BISACODYL 10 MG RE SUPP: 10.0000 mg | Freq: Every day | RECTAL | Status: DC | PRN

## 2017-09-22 MED ORDER — ONDANSETRON HCL 4 MG/2ML IJ SOLN
4.0000 mg | Freq: Four times a day (QID) | INTRAMUSCULAR | Status: DC | PRN
  Administered 2017-09-23: 4 mg via INTRAVENOUS
  Filled 2017-09-22: qty 2

## 2017-09-22 MED ORDER — PANTOPRAZOLE SODIUM 40 MG PO TBEC
40.0000 mg | DELAYED_RELEASE_TABLET | Freq: Every day | ORAL | Status: DC
  Administered 2017-09-22 – 2017-09-25 (×4): 40 mg via ORAL
  Filled 2017-09-22 (×5): qty 1

## 2017-09-22 MED ORDER — MOVING RIGHT ALONG BOOK
Freq: Once | Status: AC
  Administered 2017-09-22: 09:00:00
  Filled 2017-09-22: qty 1

## 2017-09-22 MED ORDER — ALPRAZOLAM 0.5 MG PO TABS
0.5000 mg | ORAL_TABLET | Freq: Two times a day (BID) | ORAL | Status: DC | PRN
  Administered 2017-09-22 – 2017-09-24 (×4): 0.5 mg via ORAL
  Filled 2017-09-22 (×4): qty 1

## 2017-09-22 MED ORDER — ONDANSETRON HCL 4 MG PO TABS: 4.0000 mg | ORAL_TABLET | Freq: Four times a day (QID) | ORAL | Status: DC | PRN

## 2017-09-22 MED ORDER — AMIODARONE LOAD VIA INFUSION
150.0000 mg | Freq: Once | INTRAVENOUS | Status: AC
  Administered 2017-09-22: 150 mg via INTRAVENOUS
  Filled 2017-09-22: qty 83.34

## 2017-09-22 MED ORDER — ALUM & MAG HYDROXIDE-SIMETH 200-200-20 MG/5ML PO SUSP: 15.0000 mL | ORAL | Status: DC | PRN

## 2017-09-22 MED ORDER — AMIODARONE HCL IN DEXTROSE 360-4.14 MG/200ML-% IV SOLN
30.0000 mg/h | INTRAVENOUS | Status: DC
  Administered 2017-09-23 – 2017-09-24 (×3): 30 mg/h via INTRAVENOUS
  Filled 2017-09-22 (×3): qty 200

## 2017-09-22 MED ORDER — SODIUM CHLORIDE 0.9 % IV SOLN: 250.0000 mL | INTRAVENOUS | Status: DC | PRN

## 2017-09-22 MED ORDER — INSULIN ASPART 100 UNIT/ML ~~LOC~~ SOLN
0.0000 [IU] | Freq: Three times a day (TID) | SUBCUTANEOUS | Status: DC
  Administered 2017-09-22 – 2017-09-25 (×6): 2 [IU] via SUBCUTANEOUS

## 2017-09-22 MED ORDER — ASPIRIN EC 325 MG PO TBEC
325.0000 mg | DELAYED_RELEASE_TABLET | Freq: Every day | ORAL | Status: DC
  Administered 2017-09-22 – 2017-09-25 (×4): 325 mg via ORAL
  Filled 2017-09-22 (×4): qty 1

## 2017-09-22 MED ORDER — AMIODARONE HCL IN DEXTROSE 360-4.14 MG/200ML-% IV SOLN: 60.0000 mg/h | INTRAVENOUS | Status: AC

## 2017-09-22 MED ORDER — TRAMADOL HCL 50 MG PO TABS: 50.0000 mg | ORAL_TABLET | Freq: Four times a day (QID) | ORAL | Status: DC | PRN

## 2017-09-22 NOTE — Progress Notes (Signed)
Inpatient Diabetes Program Recommendations  AACE/ADA: New Consensus Statement on Inpatient Glycemic Control (2015)  Target Ranges:  Prepandial:   less than 140 mg/dL      Peak postprandial:   less than 180 mg/dL (1-2 hours)      Critically ill patients:  140 - 180 mg/dL   Lab Results  Component Value Date   GLUCAP 111 (H) 09/22/2017   HGBA1C 7.4 (H) 09/12/2017    Review of Glycemic Control Results for Karen Tate, Karen Tate (MRN [id number]) as of 09/22/2017 12:01  Ref. Range 09/21/2017 15:49 09/21/2017 19:52 09/21/2017 23:41 09/22/2017 03:36 09/22/2017 07:23  Glucose-Capillary Latest Ref Range: 70 - 99 mg/dL 108 (H) 157 (H) 116 (H) 81 111 (H)   Diabetes history: Type 2 DM Outpatient Diabetes medications: Glipizide 5 mg BID, Metformin 1000 mg BID Current orders for Inpatient glycemic control: Novolog 3 units TID, Levemir 20 units BID, Metformin 1000 mg BID, Glipizide 5 mg BID, Novolog 0-24 units TID + HS  Inpatient Diabetes Program Recommendations:    Noted decrease to Levemir to 20 units BID following hypoglycemia.   Would recommend discontinuing meal coverage, Novolog 3 units.   Could consider further reduction to Lantus 13 units BID (90.9 kg x 0.3).  Thanks, Bronson Curb, MSN, RNC-OB Diabetes Coordinator [phone] (8a-5p)

## 2017-09-22 NOTE — Evaluation (Signed)
Occupational Therapy Evaluation Patient Details Name: Karen Tate MRN: [id number] DOB: 04-Nov-1945 Today's Date: 09/22/2017    History of Present Illness Pt is a 72 y.o. 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 PMH includes  HTN, PE, DMII, glaucoma, arthritis, lumbar fusion.   Clinical Impression   PTA, pt was living with her husband and was independent and working. Pt currently requiring Min Guard A for grooming at sink, Mod A for dressing/bathing, and Min A for functional mobility with RW. Providing education on sternal precautions and compensatory techniques for LB ADLs and toileting; pt verbalizing and demonstrating understanding. Pt would benefit from further acute OT to facilitate safe dc. Recommend dc to home with HHOT for further OT to optimize safety, independence with ADLs, and return to PLOF.      Follow Up Recommendations  Home health OT;Supervision/Assistance - 24 hour    Equipment Recommendations  3 in 1 bedside commode    Recommendations for Other Services PT consult     Precautions / Restrictions Precautions Precautions: Sternal;Fall Precaution Comments: Verbally reviewed precautions Restrictions Weight Bearing Restrictions: Yes(Sternal precations) Other Position/Activity Restrictions: Sternal precautions      Mobility Bed Mobility               General bed mobility comments: In recliner upon arrival  Transfers Overall transfer level: Needs assistance Equipment used: Rolling walker (2 wheeled) Transfers: Sit to/from Stand Sit to Stand: Min assist         General transfer comment: Min A for power up into standing from lower surface. Education on hand positioning for sternal precautions    Balance Overall balance assessment: Needs assistance Sitting-balance support: No upper extremity supported;Feet supported Sitting balance-Leahy Scale: Fair     Standing balance support: No upper extremity supported;During functional activity Standing balance-Leahy  Scale: Fair Standing balance comment: Able to maintain static standing without UE support                           ADL either performed or assessed with clinical judgement   ADL Overall ADL's : Needs assistance/impaired Eating/Feeding: Set up;Supervision/ safety;Sitting   Grooming: Min guard;Wash/dry hands;Standing Grooming Details (indicate cue type and reason): Hand hygiene Upper Body Bathing: Moderate assistance;Sitting   Lower Body Bathing: Moderate assistance;Sit to/from stand   Upper Body Dressing : Minimal assistance;Sitting   Lower Body Dressing: Moderate assistance;Sit to/from stand Lower Body Dressing Details (indicate cue type and reason): Pt unable to bring ankles to knees. Pt reporting husband can assist Toilet Transfer: Minimal Teacher, English as a foreign language;Ambulation;RW Toilet Transfer Details (indicate cue type and reason): Min A for safe descent         Functional mobility during ADLs: Minimal assistance;Rolling walker General ADL Comments: Pt highly motivated to return to PLOF. Increased assistance for LB ADLs     Vision         Perception     Praxis      Pertinent Vitals/Pain Pain Assessment: Faces Faces Pain Scale: Hurts little more Pain Location: Chest Pain Descriptors / Indicators: Constant;Discomfort;Grimacing Pain Intervention(s): Monitored during session;Limited activity within patient's tolerance;Repositioned     Hand Dominance Right   Extremity/Trunk Assessment Upper Extremity Assessment Upper Extremity Assessment: Overall WFL for tasks assessed(Within sternal precautions)   Lower Extremity Assessment Lower Extremity Assessment: Overall WFL for tasks assessed   Cervical / Trunk Assessment Cervical / Trunk Assessment: Other exceptions Cervical / Trunk Exceptions: s/p CABG   Communication Communication Communication: No difficulties   Cognition Arousal/Alertness:  Awake/alert Behavior During Therapy: WFL for tasks  assessed/performed Overall Cognitive Status: Within Functional Limits for tasks assessed                                     General Comments  HR 147, BP 155/84, RR 29, SpO2 high 90s on RA    Exercises     Shoulder Instructions      Home Living Family/patient expects to be discharged to:: Private residence Living Arrangements: Spouse/significant other Available Help at Discharge: Family;Available 24 hours/day Type of Home: House Home Access: Stairs to enter [employer] of Steps: 1 threshold onto porch   Home Layout: One level     Bathroom Shower/Tub: Teacher, early years/pre: Standard     Home Equipment: Environmental consultant - 2 wheels;Walker - 4 wheels;Cane - single point;Shower seat;Hand held shower head;Adaptive equipment Adaptive Equipment: Reacher;Sock aid        Prior Functioning/Environment Level of Independence: Needs assistance        Comments: Mod indep with SPC. Husband indep with ambulation and ADLs, but pt does majority of cooking/cleaning at home. Pt drives. Daughter nearby and can assist when not working. 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        OT Problem List: Decreased strength;Decreased range of motion;Decreased activity tolerance;Impaired balance (sitting and/or standing);Decreased safety awareness;Decreased knowledge of use of DME or AE;Decreased knowledge of precautions;Pain      OT Treatment/Interventions: Self-care/ADL training;Therapeutic exercise;Energy conservation;DME and/or AE instruction;Therapeutic activities;Patient/family education    OT Goals(Current goals can be found in the care plan section) Acute Rehab OT Goals Patient Stated Goal: Feel safe and strong before returning home OT Goal Formulation: With patient Time For Goal Achievement: 10/06/17 Potential to Achieve Goals: Good ADL Goals Pt Will Perform Upper Body Dressing: with set-up;with supervision;sitting Pt Will Perform Lower  Body Dressing: with set-up;with supervision;sit to/from stand;with adaptive equipment;with caregiver independent in assisting Pt Will Transfer to Toilet: with set-up;with supervision;ambulating;regular height toilet Pt Will Perform Toileting - Clothing Manipulation and hygiene: with set-up;with supervision;sit to/from stand Pt Will Perform Tub/Shower Transfer: Tub transfer;3 in 1;rolling walker;ambulating;with min guard assist Additional ADL Goal #1: Pt will adhere to sternal precautions during ADLs with 2-3 cues  OT Frequency: Min 3X/week   Barriers to D/C:            Co-evaluation              AM-PAC PT "6 Clicks" Daily Activity     Outcome Measure Help from another person eating meals?: A Little Help from another person taking care of personal grooming?: A Little Help from another person toileting, which includes using toliet, bedpan, or urinal?: A Little Help from another person bathing (including washing, rinsing, drying)?: A Lot Help from another person to put on and taking off regular upper body clothing?: A Little Help from another person to put on and taking off regular lower body clothing?: A Lot 6 Click Score: 16   End of Session Equipment Utilized During Treatment: Gait belt;Rolling walker Nurse Communication: Mobility status;Precautions  Activity Tolerance: Patient tolerated treatment well Patient left: in chair;with call bell/phone within reach;with family/visitor present  OT Visit Diagnosis: Unsteadiness on feet (R26.81);Other abnormalities of gait and mobility (R26.89);Muscle weakness (generalized) (M62.81)                Time: 0737-1062 OT Time Calculation (  min): 26 min Charges:  OT General Charges $OT Visit: 1 Visit OT Evaluation $OT Eval Moderate Complexity: 1 Mod OT Treatments $Self Care/Home Management : 8-22 mins G-Codes:     Daylynn Stumpp MSOT, OTR/L Acute Rehab Pager: [phone] Office: [facility] 09/22/2017, 1:05  PM

## 2017-09-22 NOTE — Progress Notes (Signed)
DAILY PROGRESS NOTE   Patient Name: Karen Tate Date of Encounter: 09/22/2017  Chief Complaint   Status post CABG 6/27  Patient Profile   [name] is a 72 year old woman s/p 3V CABG (LIMA-LAD, SVG sequential to R1 and R2) on 09/18/17.  Subjective   Patient feeling improved today. No complaints. Ambulating well with assistance.   Review of Systems  Constitutional: Negative for chills and fever.  Eyes: Negative for blurred vision and double vision.  Respiratory: Negative for cough, shortness of breath and stridor.   Cardiovascular: Negative for chest pain, palpitations, orthopnea and PND.  Gastrointestinal: Negative for abdominal pain, blood in stool and vomiting.  Genitourinary: Negative for hematuria.  Musculoskeletal: Negative for falls and myalgias.  Neurological: Negative for loss of consciousness.    Objective   Vitals:   09/22/17 1000 09/22/17 1100 09/22/17 1200 09/22/17 1232  BP:  134/73 130/85   Pulse:   (!) 105   Resp: (!) 24 (!) 29 (!) 25   Temp:    97.6 F (36.4 C)  TempSrc:    Oral  SpO2:  100% 99%   Weight:      Height:        Intake/Output Summary (Last 24 hours) at 09/22/2017 1236 Last data filed at 09/22/2017 0830 Gross per 24 hour  Intake 480 ml  Output 1065 ml  Net -585 ml   Filed Weights   09/20/17 0500 09/21/17 0500 09/22/17 0600  Weight: 211 lb 6.7 oz (95.9 kg) 205 lb 11 oz (93.3 kg) 200 lb 2.8 oz (90.8 kg)    Physical Exam   General appearance: alert and cooperative Neck: no JVD, supple, symmetrical, trachea midline and thyroid not enlarged, symmetric, no tenderness/mass/nodules Lungs: clear to auscultation bilaterally Heart: regular rate and rhythm, S1, S2 normal, no murmur, click, rub or gallop Abdomen: soft, non-tender; bowel sounds normal; no masses,  no organomegaly Extremities: extremities normal, atraumatic, no cyanosis or edema Pulses: Left Dorsalis Pedis present 2+ Right Dorsalis Pedis present 2+ bilateral radial pulses  2+ Skin: Skin color, texture, turgor normal. No rashes or lesions Neurologic: Grossly normal  Inpatient Medications    Scheduled Meds:  amLODipine  10 mg Oral Daily   aspirin EC  325 mg Oral Daily   atorvastatin  80 mg Oral q1800   Chlorhexidine Gluconate Cloth  6 each Topical Daily   Chlorhexidine Gluconate Cloth  6 each Topical Daily   docusate sodium  200 mg Oral Daily   enoxaparin (LOVENOX) injection  40 mg Subcutaneous QHS   furosemide  20 mg Oral BID   glipiZIDE  5 mg Oral BID AC   hydrochlorothiazide  25 mg Oral Daily   insulin aspart  0-24 Units Subcutaneous TID AC & HS   insulin aspart  3 Units Subcutaneous TID WC   insulin detemir  20 Units Subcutaneous BID   mouth rinse  15 mL Mouth Rinse BID   metFORMIN  1,000 mg Oral BID WC   omega-3 acid ethyl esters  1 g Oral TID   pantoprazole  40 mg Oral QAC breakfast   potassium chloride  20 mEq Oral Daily   sodium chloride flush  3 mL Intravenous Q12H    Continuous Infusions:  sodium chloride      PRN Meds: sodium chloride, acetaminophen, ALPRAZolam, alum & mag hydroxide-simeth, bisacodyl **OR** bisacodyl, meclizine, ondansetron **OR** ondansetron (ZOFRAN) IV, sodium chloride flush, sodium chloride flush, tiZANidine, traMADol   Labs   Results for orders placed or  performed during the hospital encounter of 09/12/17 (from the past 48 hour(s))  Glucose, capillary     Status: Abnormal   Collection Time: 09/20/17  3:05 PM  Result Value Ref Range   Glucose-Capillary 176 (H) 70 - 99 mg/dL   Comment 1 Capillary Specimen   Glucose, capillary     Status: Abnormal   Collection Time: 09/20/17  7:17 PM  Result Value Ref Range   Glucose-Capillary 128 (H) 70 - 99 mg/dL   Comment 1 Notify RN   Glucose, capillary     Status: Abnormal   Collection Time: 09/20/17 11:58 PM  Result Value Ref Range   Glucose-Capillary 157 (H) 70 - 99 mg/dL   Comment 1 Notify RN   Glucose, capillary     Status: Abnormal    Collection Time: 09/21/17  3:31 AM  Result Value Ref Range   Glucose-Capillary 116 (H) 70 - 99 mg/dL   Comment 1 Notify RN   Basic metabolic panel     Status: Abnormal   Collection Time: 09/21/17  5:13 AM  Result Value Ref Range   Sodium 138 135 - 145 mmol/L   Potassium 3.4 (L) 3.5 - 5.1 mmol/L    Comment: DELTA CHECK NOTED   Chloride 103 98 - 111 mmol/L    Comment: Please note change in reference range.   CO2 29 22 - 32 mmol/L   Glucose, Bld 83 70 - 99 mg/dL    Comment: Please note change in reference range.   BUN 21 8 - 23 mg/dL    Comment: Please note change in reference range.   Creatinine, Ser 1.10 (H) 0.44 - 1.00 mg/dL   Calcium 8.4 (L) 8.9 - 10.3 mg/dL   GFR calc non Af Amer 49 (L) >60 mL/min   GFR calc Af Amer 56 (L) >60 mL/min    Comment: (NOTE) The eGFR has been calculated using the CKD EPI equation. This calculation has not been validated in all clinical situations. eGFR's persistently <60 mL/min signify possible Chronic Kidney Disease.    Anion gap 6 5 - 15    Comment: Performed at [facility] [address] [place], [place] [address]  CBC     Status: Abnormal   Collection Time: 09/21/17  5:13 AM  Result Value Ref Range   WBC 10.3 4.0 - 10.5 K/uL   RBC 3.06 (L) 3.87 - 5.11 MIL/uL   Hemoglobin 9.0 (L) 12.0 - 15.0 g/dL   HCT 28.5 (L) 36.0 - 46.0 %   MCV 93.1 78.0 - 100.0 fL   MCH 29.4 26.0 - 34.0 pg   MCHC 31.6 30.0 - 36.0 g/dL   RDW 15.4 11.5 - 15.5 %   Platelets 181 150 - 400 K/uL    Comment: Performed at [facility], [place] [address] [place], [place] [address]  Glucose, capillary     Status: None   Collection Time: 09/21/17  7:24 AM  Result Value Ref Range   Glucose-Capillary 74 70 - 99 mg/dL   Comment 1 Notify RN   Glucose, capillary     Status: Abnormal   Collection Time: 09/21/17 11:36 AM  Result Value Ref Range   Glucose-Capillary 23 (LL) 70 - 99 mg/dL   Comment 1 Notify RN   Glucose, random     Status: Abnormal   Collection Time:  09/21/17 11:46 AM  Result Value Ref Range   Glucose, Bld 36 (LL) 70 - 99 mg/dL    Comment: Please note change  in reference range. CRITICAL RESULT CALLED TO, READ BACK BY AND VERIFIED WITH: S.WASHBURN RN @ 1610 09/21/17 BY C.EDENS Performed at [facility], [place] [address], [place], [place] [address]   Glucose, capillary     Status: Abnormal   Collection Time: 09/21/17 12:13 PM  Result Value Ref Range   Glucose-Capillary 38 (LL) 70 - 99 mg/dL   Comment 1 Notify RN   Glucose, capillary     Status: Abnormal   Collection Time: 09/21/17  1:10 PM  Result Value Ref Range   Glucose-Capillary 48 (L) 70 - 99 mg/dL   Comment 1 Capillary Specimen   Glucose, capillary     Status: None   Collection Time: 09/21/17  1:50 PM  Result Value Ref Range   Glucose-Capillary 78 70 - 99 mg/dL  Glucose, capillary     Status: Abnormal   Collection Time: 09/21/17  3:49 PM  Result Value Ref Range   Glucose-Capillary 108 (H) 70 - 99 mg/dL   Comment 1 Notify RN   Glucose, capillary     Status: Abnormal   Collection Time: 09/21/17  7:52 PM  Result Value Ref Range   Glucose-Capillary 157 (H) 70 - 99 mg/dL   Comment 1 Notify RN   Glucose, capillary     Status: Abnormal   Collection Time: 09/21/17 11:41 PM  Result Value Ref Range   Glucose-Capillary 116 (H) 70 - 99 mg/dL   Comment 1 Notify RN   Glucose, capillary     Status: None   Collection Time: 09/22/17  3:36 AM  Result Value Ref Range   Glucose-Capillary 81 70 - 99 mg/dL   Comment 1 Notify RN   Basic metabolic panel     Status: Abnormal   Collection Time: 09/22/17  4:50 AM  Result Value Ref Range   Sodium 140 135 - 145 mmol/L   Potassium 3.4 (L) 3.5 - 5.1 mmol/L   Chloride 101 98 - 111 mmol/L    Comment: Please note change in reference range.   CO2 30 22 - 32 mmol/L   Glucose, Bld 84 70 - 99 mg/dL    Comment: Please note change in reference range.   BUN 17 8 - 23 mg/dL    Comment: Please note change in reference range.   Creatinine, Ser  0.99 0.44 - 1.00 mg/dL   Calcium 8.8 (L) 8.9 - 10.3 mg/dL   GFR calc non Af Amer 55 (L) >60 mL/min   GFR calc Af Amer >60 >60 mL/min    Comment: (NOTE) The eGFR has been calculated using the CKD EPI equation. This calculation has not been validated in all clinical situations. eGFR's persistently <60 mL/min signify possible Chronic Kidney Disease.    Anion gap 9 5 - 15    Comment: Performed at [facility] [address] [place], [place] [address]  CBC     Status: Abnormal   Collection Time: 09/22/17  4:50 AM  Result Value Ref Range   WBC 7.8 4.0 - 10.5 K/uL   RBC 3.06 (L) 3.87 - 5.11 MIL/uL   Hemoglobin 9.1 (L) 12.0 - 15.0 g/dL   HCT 28.9 (L) 36.0 - 46.0 %   MCV 94.4 78.0 - 100.0 fL   MCH 29.7 26.0 - 34.0 pg   MCHC 31.5 30.0 - 36.0 g/dL   RDW 15.3 11.5 - 15.5 %   Platelets 213 150 - 400 K/uL    Comment: Performed at [facility], [place]  [address] [place], [place] [address]  Glucose, capillary     Status: Abnormal   Collection Time: 09/22/17  7:23 AM  Result Value Ref Range   Glucose-Capillary 111 (H) 70 - 99 mg/dL   Comment 1 Capillary Specimen    Comment 2 Notify RN     ECG   Sinus rhythm - Personally Reviewed  Telemetry   Sinus rhythm - Personally Reviewed  Radiology    Dg Chest Port 1 View  Result Date: 09/22/2017 CLINICAL DATA:  Follow-up cardiac surgery EXAM: PORTABLE CHEST 1 VIEW COMPARISON:  Two days ago FINDINGS: Stable heart size and mediastinal contours after CABG. Atelectasis mainly on the left. Probable trace right effusion. No visible pneumothorax. Left upper extremity PICC in good position. Right IJ sheath has been removed. IMPRESSION: 1. New PICC in good position. 2. Stable atelectasis.  Probable trace right effusion. Electronically Signed   By: Monte Fantasia M.D.   On: 09/22/2017 07:39   [place] Ekg Site Rite  Result Date: 09/21/2017 If Site Rite image not attached, placement could not be confirmed due to current cardiac rhythm.   Cardiac  Studies   CAD:  Cath 09/15/17 with severe 3 VD including 99% ramus, 80% mid/distal circumflex and 90% proximal LAD.  EF 25-30%   Assessment   1. Active Problems: 2.   Unstable angina (HCC) 3.   Chest pain 4.   Non-STEMI (non-ST elevated myocardial infarction) ([place]) 5.   Pulmonary embolus ([place]) 6.   Type 2 diabetes mellitus ([place]) 7.   S/P CABG x 3 8.   Plan   1. NSTEMI now s/p CABG 09/18/17 -Doing well, ambulating with assistance. No complaints today. Counseled on dietary changes as she is very motivated to do this.  -will need outpatient cardiology follow up post discharge. EF depressed prior to surgery, will need to repeat as outpatient.  -while usually recommend dual antiplatelet therapy post NSTEMI, will also need anticoagulation for history of DVT/PE (below). Recommend transitioning to aspirin 81 mg and apixaban when acceptable by surgical standpoint. Should follow up with outpatient cardiology post discharge.  Ischemic cardiomyopathy: -would recommend metoprolol, start at 12.5 mg BID and uptitrate as tolerated.  -was on benazepril at home (40 mg daily). Given low EF, should be on ACEI. Will add this back (at lower dose, likely 10 mg daily) once metoprolol is uptitrated. -once she is on ACEI and Bblocker, consider spironolactone given low EF.  Hypertension: -continue amlodipine for now. Suspect her blood pressure will tolerate many meds given her prior to admission medication list.  History of DVT/PE -when acceptable from surgical standpoint, will need to restart anticoagulation. Was on apixaban as outpatient.I do not have access to the records but per patient needs to remain on anticoagulation.   Time Spent Directly with Patient:  I have spent a total of >35 minutes with the patient reviewing hospital notes, telemetry, EKGs, labs and examining the patient as well as establishing an assessment and plan that was discussed personally with the patient.  > 50% of time was spent in  direct patient care.  Length of Stay:  LOS: 10 days   Buford Dresser, MD, PhD [facility]   Bridgette A Christopher 09/22/2017, 12:36 PM

## 2017-09-22 NOTE — Progress Notes (Signed)
[facility] BRIEF SICU PROGRESS NOTE  4 Days Post-Op  S/P Procedure(s) (LRB): CORONARY ARTERY BYPASS GRAFTING (CABG) (N/A) TRANSESOPHAGEAL ECHOCARDIOGRAM (TEE) (N/A)   Stable day  Plan: Awaiting bed for transfer  Rexene Alberts, MD 09/22/2017 6:55 PM

## 2017-09-22 NOTE — Progress Notes (Signed)
Patient ID: Karen Tate, female   DOB: June 21, 1945, 72 y.o.   MRN: [id number] T[facility] DAILY ICU PROGRESS NOTE                   [place]            [place] [address]          [phone]   4 Days Post-Op Procedure(s) (LRB): CORONARY ARTERY BYPASS GRAFTING (CABG) (N/A) TRANSESOPHAGEAL ECHOCARDIOGRAM (TEE) (N/A)  Total Length of Stay:  LOS: 10 days   Subjective: Gaining strength, walks better with help now  Objective: Vital signs in last 24 hours: Temp:  [97.5 F (36.4 C)-98 F (36.7 C)] 98 F (36.7 C) (07/01 0728) Pulse Rate:  [91] 91 (06/30 2148) Cardiac Rhythm: Normal sinus rhythm (07/01 0000) Resp:  [0-37] 23 (07/01 0700) BP: (108-146)/(60-85) 124/64 (07/01 0700) SpO2:  [96 %-100 %] 97 % (07/01 0700) Weight:  [200 lb 2.8 oz (90.8 kg)] 200 lb 2.8 oz (90.8 kg) (07/01 0600)  Filed Weights   09/20/17 0500 09/21/17 0500 09/22/17 0600  Weight: 211 lb 6.7 oz (95.9 kg) 205 lb 11 oz (93.3 kg) 200 lb 2.8 oz (90.8 kg)    Weight change: -5 lb 8.2 oz (-2.5 kg)   Hemodynamic parameters for last 24 hours:    Intake/Output from previous day: 06/30 0701 - 07/01 0700 In: 847.5 [P.O.:840; I.V.:7.5] Out: 1415 [GGEZM:6294; Drains:40]  Intake/Output this shift: No intake/output data recorded.  Current Meds: Scheduled Meds: . acetaminophen  1,000 mg Oral Q6H   Or  . acetaminophen (TYLENOL) oral liquid 160 mg/5 mL  1,000 mg Per Tube Q6H  . ALPRAZolam  0.5 mg Oral BID  . amLODipine  10 mg Oral Daily  . aspirin EC  81 mg Oral Daily  . atorvastatin  80 mg Oral q1800  . bisacodyl  10 mg Oral Daily   Or  . bisacodyl  10 mg Rectal Daily  . Chlorhexidine Gluconate Cloth  6 each Topical Daily  . Chlorhexidine Gluconate Cloth  6 each Topical Daily  . docusate sodium  200 mg Oral Daily  . enoxaparin (LOVENOX) injection  40 mg Subcutaneous QHS  . furosemide  20 mg Oral BID  . glipiZIDE  5 mg Oral BID AC  . hydrochlorothiazide  25 mg Oral Daily  . insulin aspart   0-24 Units Subcutaneous Q4H  . insulin aspart  3 Units Subcutaneous TID WC  . insulin detemir  20 Units Subcutaneous BID  . mouth rinse  15 mL Mouth Rinse BID  . metFORMIN  1,000 mg Oral BID WC  . metoCLOPramide (REGLAN) injection  10 mg Intravenous Q6H  . metoprolol tartrate  25 mg Oral BID   Or  . metoprolol tartrate  25 mg Per Tube BID  . omega-3 acid ethyl esters  1 g Oral TID  . pantoprazole  40 mg Oral Daily  . potassium chloride  20 mEq Oral Daily  . sodium chloride flush  10-40 mL Intracatheter Q12H   Continuous Infusions: . sodium chloride Stopped (09/19/17 0800)  . sodium chloride    . sodium chloride 10 mL/hr at 09/20/17 1400  . dexmedetomidine (PRECEDEX) IV infusion Stopped (09/18/17 1545)  . lactated ringers    . lactated ringers Stopped (09/21/17 0814)  . lactated ringers Stopped (09/21/17 0813)  . nitroGLYCERIN Stopped (09/20/17 0718)  . phenylephrine (NEO-SYNEPHRINE) Adult infusion Stopped (09/18/17 2132)   PRN Meds:.sodium chloride, ALPRAZolam, hydrALAZINE, lactated ringers, meclizine, metoprolol tartrate, midazolam,  morphine injection, ondansetron (ZOFRAN) IV, oxyCODONE, sodium chloride flush, sodium chloride flush, tiZANidine, traMADol  General appearance: alert, cooperative and no distress Neurologic: intact Heart: regular rate and rhythm, S1, S2 normal, no murmur, click, rub or gallop Lungs: diminished breath sounds bibasilar Abdomen: soft, non-tender; bowel sounds normal; no masses,  no organomegaly Extremities: extremities normal, atraumatic, no cyanosis or edema and Homans sign is negative, no sign of DVT Wound: Sternum stable  Lab Results: CBC: Recent Labs    09/21/17 0513 09/22/17 0450  WBC 10.3 7.8  HGB 9.0* 9.1*  HCT 28.5* 28.9*  PLT 181 213   BMET:  Recent Labs    09/21/17 0513 09/21/17 1146 09/22/17 0450  NA 138  --  140  K 3.4*  --  3.4*  CL 103  --  101  CO2 29  --  30  GLUCOSE 83 36* 84  BUN 21  --  17  CREATININE 1.10*  --   0.99  CALCIUM 8.4*  --  8.8*    CMET: Lab Results  Component Value Date   WBC 7.8 09/22/2017   HGB 9.1 (L) 09/22/2017   HCT 28.9 (L) 09/22/2017   PLT 213 09/22/2017   GLUCOSE 84 09/22/2017   CHOL 152 09/13/2017   TRIG 209 (H) 09/13/2017   HDL 35 (L) 09/13/2017   LDLCALC 75 09/13/2017   ALT 23 09/13/2017   AST 32 09/13/2017   NA 140 09/22/2017   K 3.4 (L) 09/22/2017   CL 101 09/22/2017   CREATININE 0.99 09/22/2017   BUN 17 09/22/2017   CO2 30 09/22/2017   TSH 1.064 09/12/2017   INR 1.33 09/18/2017   HGBA1C 7.4 (H) 09/12/2017      PT/INR: No results for input(s): LABPROT, INR in the last 72 hours. Radiology: [place] Ekg Site Rite  Result Date: 09/21/2017 If [facility] image not attached, placement could not be confirmed due to current cardiac rhythm.    Assessment/Plan: S/P Procedure(s) (LRB): CORONARY ARTERY BYPASS GRAFTING (CABG) (N/A) TRANSESOPHAGEAL ECHOCARDIOGRAM (TEE) (N/A) Mobilize Diuresis Diabetes control Plan for transfer to step-down: see transfer orders Needs physical therapy to continue with mobilization    [name] 09/22/2017 7:36 AM

## 2017-09-22 NOTE — Plan of Care (Signed)
  Problem: Health Behavior/Discharge Planning: Goal: Ability to manage health-related needs will improve Outcome: Progressing   Problem: Clinical Measurements: Goal: Ability to maintain clinical measurements within normal limits will improve Outcome: Progressing Goal: Will remain free from infection Outcome: Progressing Goal: Diagnostic test results will improve Outcome: Progressing Goal: Respiratory complications will improve Outcome: Progressing Goal: Cardiovascular complication will be avoided Outcome: Progressing   Problem: Activity: Goal: Risk for activity intolerance will decrease Outcome: Progressing   Problem: Nutrition: Goal: Adequate nutrition will be maintained Outcome: Progressing   Problem: Elimination: Goal: Will not experience complications related to bowel motility Outcome: Progressing Goal: Will not experience complications related to urinary retention Outcome: Progressing   Problem: Pain Managment: Goal: General experience of comfort will improve Outcome: Progressing

## 2017-09-23 ENCOUNTER — Inpatient Hospital Stay (HOSPITAL_COMMUNITY): Payer: Medicare Other

## 2017-09-23 DIAGNOSIS — Z951 Presence of aortocoronary bypass graft: Secondary | ICD-10-CM

## 2017-09-23 DIAGNOSIS — I2510 Atherosclerotic heart disease of native coronary artery without angina pectoris: Secondary | ICD-10-CM

## 2017-09-23 LAB — GLUCOSE, CAPILLARY
Glucose-Capillary: 102 mg/dL — ABNORMAL HIGH (ref 70–99)
Glucose-Capillary: 72 mg/dL (ref 70–99)
Glucose-Capillary: 123 mg/dL — ABNORMAL HIGH (ref 70–99)
Glucose-Capillary: 142 mg/dL — ABNORMAL HIGH (ref 70–99)
Glucose-Capillary: 71 mg/dL (ref 70–99)
Glucose-Capillary: 139 mg/dL — ABNORMAL HIGH (ref 70–99)
Glucose-Capillary: 135 mg/dL — ABNORMAL HIGH (ref 70–99)

## 2017-09-23 LAB — BASIC METABOLIC PANEL
Anion gap: 7 (ref 5–15)
BUN: 17 mg/dL (ref 8–23)
CO2: 29 mmol/L (ref 22–32)
Calcium: 8.5 mg/dL — ABNORMAL LOW (ref 8.9–10.3)
Chloride: 99 mmol/L (ref 98–111)
Creatinine, Ser: 1.19 mg/dL — ABNORMAL HIGH (ref 0.44–1.00)
GFR calc Af Amer: 51 mL/min — ABNORMAL LOW (ref >60)
GFR calc non Af Amer: 44 mL/min — ABNORMAL LOW (ref >60)
Glucose, Bld: 164 mg/dL — ABNORMAL HIGH (ref 70–99)
Potassium: 3.4 mmol/L — ABNORMAL LOW (ref 3.5–5.1)
Sodium: 135 mmol/L (ref 135–145)

## 2017-09-23 LAB — CBC
HCT: 29 % — ABNORMAL LOW (ref 36.0–46.0)
Hemoglobin: 9.1 g/dL — ABNORMAL LOW (ref 12.0–15.0)
MCH: 29.6 pg (ref 26.0–34.0)
MCHC: 31.4 g/dL (ref 30.0–36.0)
MCV: 94.5 fL (ref 78.0–100.0)
Platelets: 245 10*3/uL (ref 150–400)
RBC: 3.07 MIL/uL — ABNORMAL LOW (ref 3.87–5.11)
RDW: 15 % (ref 11.5–15.5)
WBC: 7.5 10*3/uL (ref 4.0–10.5)

## 2017-09-23 MED ORDER — BENAZEPRIL HCL 10 MG PO TABS
10.0000 mg | ORAL_TABLET | Freq: Every day | ORAL | Status: DC
  Administered 2017-09-23: 10 mg via ORAL
  Filled 2017-09-23 (×2): qty 1

## 2017-09-23 MED ORDER — POTASSIUM CHLORIDE CRYS ER 20 MEQ PO TBCR
30.0000 meq | EXTENDED_RELEASE_TABLET | Freq: Two times a day (BID) | ORAL | Status: DC
  Administered 2017-09-23 (×2): 30 meq via ORAL
  Filled 2017-09-23 (×2): qty 1

## 2017-09-23 MED ORDER — INSULIN DETEMIR 100 UNIT/ML ~~LOC~~ SOLN
13.0000 [IU] | Freq: Two times a day (BID) | SUBCUTANEOUS | Status: DC
  Administered 2017-09-23: 13 [IU] via SUBCUTANEOUS
  Filled 2017-09-23 (×2): qty 0.13

## 2017-09-23 NOTE — Progress Notes (Signed)
Progress Note  Patient Name: Karen Tate Date of Encounter: 09/23/2017  Primary Cardiologist: Kate Sable, MD   Subjective   Had an episode of atrial fibrillation yesterday and was started on amiodarone. She cannot feel the difference when she is in afib or sinus. Back in sinus rhythm now.  We reviewed her recommendations for DVT/PE. She follows with a provider in [place] for her anticoagulation. Her understanding is that this is an indefinite anticoagulation.   Denies fevers, chills, chest pain, shortness of breath.  Inpatient Medications    Scheduled Meds:  amLODipine  10 mg Oral Daily   aspirin EC  325 mg Oral Daily   atorvastatin  80 mg Oral q1800   Chlorhexidine Gluconate Cloth  6 each Topical Daily   Chlorhexidine Gluconate Cloth  6 each Topical Daily   docusate sodium  200 mg Oral Daily   enoxaparin (LOVENOX) injection  40 mg Subcutaneous QHS   furosemide  20 mg Oral BID   glipiZIDE  5 mg Oral BID AC   hydrochlorothiazide  25 mg Oral Daily   insulin aspart  0-24 Units Subcutaneous TID AC & HS   insulin aspart  3 Units Subcutaneous TID WC   insulin detemir  20 Units Subcutaneous BID   metFORMIN  1,000 mg Oral BID WC   omega-3 acid ethyl esters  1 g Oral TID   pantoprazole  40 mg Oral QAC breakfast   potassium chloride  30 mEq Oral BID   sodium chloride flush  3 mL Intravenous Q12H   Continuous Infusions:  sodium chloride     amiodarone 30 mg/hr (09/23/17 0600)   PRN Meds: sodium chloride, acetaminophen, ALPRAZolam, alum & mag hydroxide-simeth, bisacodyl **OR** bisacodyl, meclizine, ondansetron **OR** ondansetron (ZOFRAN) IV, sodium chloride flush, sodium chloride flush, tiZANidine, traMADol   Vital Signs    Vitals:   09/23/17 0803 09/23/17 0900 09/23/17 1000 09/23/17 1127  BP:  112/66 103/82   Pulse:      Resp:  (!) 22 (!) 24   Temp: 98 F (36.7 C)   98 F (36.7 C)  TempSrc: Oral   Oral  SpO2:  97% 96%   Weight:        Height:        Intake/Output Summary (Last 24 hours) at 09/23/2017 1256 Last data filed at 09/23/2017 1030 Gross per 24 hour  Intake 741.98 ml  Output 1500 ml  Net -758.02 ml   Filed Weights   09/21/17 0500 09/22/17 0600 09/23/17 0600  Weight: 205 lb 11 oz (93.3 kg) 200 lb 2.8 oz (90.8 kg) 198 lb 13.7 oz (90.2 kg)    Telemetry    Predominanty sinus rhythm, afib episode yesterday around 13:38, occasional PVCs - Personally Reviewed  ECG    6/28 sinus rhythm - Personally Reviewed  Physical Exam   GEN: No acute distress.   Neck: supple, no JVD Cardiac: regular S1 and S2, no murmurs, rubs, or gallops. Midline sternotomy wound c/d/i Respiratory: Clear to auscultation bilaterally. GI: Soft, nontender, non-distended. Bowel sounds normal MS: No edema; No deformity. Neuro:  Nonfocal, moves all limbs independently Psych: Normal affect   Labs    Chemistry Recent Labs  Lab 09/21/17 0513 09/21/17 1146 09/22/17 0450 09/23/17 0421  NA 138  --  140 135  K 3.4*  --  3.4* 3.4*  CL 103  --  101 99  CO2 29  --  30 29  GLUCOSE 83 36* 84 164*  BUN 21  --  17 17  CREATININE 1.10*  --  0.99 1.19*  CALCIUM 8.4*  --  8.8* 8.5*  GFRNONAA 49*  --  55* 44*  GFRAA 56*  --  >60 51*  ANIONGAP 6  --  9 7     Hematology Recent Labs  Lab 09/21/17 0513 09/22/17 0450 09/23/17 0421  WBC 10.3 7.8 7.5  RBC 3.06* 3.06* 3.07*  HGB 9.0* 9.1* 9.1*  HCT 28.5* 28.9* 29.0*  MCV 93.1 94.4 94.5  MCH 29.4 29.7 29.6  MCHC 31.6 31.5 31.4  RDW 15.4 15.3 15.0  PLT 181 213 245    Cardiac EnzymesNo results for input(s): TROPONINI in the last 168 hours. No results for input(s): TROPIPOC in the last 168 hours.   BNPNo results for input(s): BNP, PROBNP in the last 168 hours.   DDimer No results for input(s): DDIMER in the last 168 hours.   Radiology    Dg Chest 2 View  Result Date: 09/23/2017 CLINICAL DATA:  Postop check. EXAM: CHEST - 2 VIEW COMPARISON:  Radiograph of September 22, 2017. FINDINGS:  Stable cardiomediastinal silhouette. Status post coronary artery bypass graft. Left-sided PICC line is unchanged in position. No pneumothorax is noted. Minimal bilateral pleural effusions are noted. Mild left basilar subsegmental atelectasis is noted. Bony thorax is unremarkable. IMPRESSION: Mild left basilar subsegmental atelectasis. Minimal bilateral pleural effusions. Electronically Signed   By: Marijo Conception, M.D.   On: 09/23/2017 07:18   Dg Chest Port 1 View  Result Date: 09/22/2017 CLINICAL DATA:  Follow-up cardiac surgery EXAM: PORTABLE CHEST 1 VIEW COMPARISON:  Two days ago FINDINGS: Stable heart size and mediastinal contours after CABG. Atelectasis mainly on the left. Probable trace right effusion. No visible pneumothorax. Left upper extremity PICC in good position. Right IJ sheath has been removed. IMPRESSION: 1. New PICC in good position. 2. Stable atelectasis.  Probable trace right effusion. Electronically Signed   By: Monte Fantasia M.D.   On: 09/22/2017 07:39   [place] Ekg Site Rite  Result Date: 09/21/2017 If Site Rite image not attached, placement could not be confirmed due to current cardiac rhythm.   Cardiac Studies   CAD: Cath 09/15/17 with severe 3 VD including 99% ramus, 80% mid/distal circumflex and 90% proximal LAD. EF 25-30%  Patient Profile     72 y.o. female s/p CABG 09/18/17 after NSTEMI. History of DVT/PE, type II diabetes. One episode of post op afib. 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  Assessment & Plan    1. NSTEMI now s/p CABG 09/18/17 -Doing well, ambulating with assistance. No complaints today.   -will need outpatient cardiology follow up post discharge. EF depressed prior to surgery, will need to repeat as outpatient.  -while usually recommend dual antiplatelet therapy post NSTEMI, will also need anticoagulation for history of DVT/PE (below). Recommend transitioning to aspirin 81 mg and apixaban when acceptable by surgical standpoint.  -follow up  with outpatient cardiology within 2 weeks post discharge. -continue atorvastatin 80 mg  Ischemic cardiomyopathy: -was planning to trial low dose beta blocker (listed as contraindication due to bradycardia). However, now on amiodarone for atrial fibrillation. Given her prior history of bradycardia on beta blockers and risk for this with amiodarone, will hold on beta blocker for now. Reassess ability to add beta blocker given her low EF.  -was on benazepril at home (40 mg daily). Given low EF, should be on ACEI. Will add this back (at lower dose, 10 mg daily) since beta blocker cannot be started. This  may also help her persistent hypokalemia -once she is on ACEI and Bblocker, consider spironolactone given low EF.  Hypertension: -continue amlodipine for now. Suspect her blood pressure will tolerate many meds given her prior to admission medication list. -adding low dose benazepril today, titrate  History of DVT/PE -when acceptable from surgical standpoint, will need to restart anticoagulation. Was on apixaban as outpatient.I do not have access to the records but per patient needs to remain on anticoagulation.     Time Spent Directly with Patient: I have spent a total of 25 minutes with the patient reviewing hospital notes, telemetry, EKGs, labs and examining the patient as well as establishing an assessment and plan that was discussed personally with the patient.  > 50% of time was spent in direct patient care.  Length of Stay:  LOS: 11 days   Buford Dresser, MD, PhD [facility]   [facility]   09/23/2017, 12:56 PM  Discharge is anticipated in the next 48 hours.  [facility] will sign off.   Medication Recommendations: aspirin 81 mg daily, apixaban 5 mg BID, benazepril (uptitrate based on blood pressure, starting at 10 mg daily), atorvastatin 80 mg Other recommendations (labs, testing, etc):  Will need repeat echocardiogram after discharge Follow up as an outpatient:  Dr.  Bronson Ing, within two weeks of discharge  For questions or updates, please contact [place] Please consult [email or web address] for contact info under Cardiology/STEMI.

## 2017-09-23 NOTE — Progress Notes (Signed)
PT Cancellation Note  Patient Details Name: Karen Tate MRN: [id number] DOB: 1945-07-20   Cancelled Treatment:    Reason Eval/Treat Not Completed: Medical issues which prohibited therapy; pt reports very low CBG earlier and fatigued as a result.  Will attempt to see another day per her request.   Reginia Naas 09/23/2017, 6:18 PM  Magda Kiel, [place] 09/23/2017

## 2017-09-23 NOTE — Progress Notes (Addendum)
TCTS DAILY ICU PROGRESS NOTE                   [address]            [place] [address]          [phone]   5 Days Post-Op Procedure(s) (LRB): CORONARY ARTERY BYPASS GRAFTING (CABG) (N/A) TRANSESOPHAGEAL ECHOCARDIOGRAM (TEE) (N/A)  Total Length of Stay:  LOS: 11 days   Subjective: Has had some afib, now on amio gtt- currently in sinus, has a PICC line  Objective: Vital signs in last 24 hours: Temp:  [97.6 F (36.4 C)-98.3 F (36.8 C)] 98 F (36.7 C) (07/02 0803) Pulse Rate:  [105] 105 (07/01 1200) Cardiac Rhythm: Normal sinus rhythm (07/02 0400) Resp:  [20-44] 26 (07/02 0600) BP: (115-159)/(59-85) 123/61 (07/02 0500) SpO2:  [94 %-100 %] 96 % (07/02 0500) Weight:  [90.2 kg (198 lb 13.7 oz)] 90.2 kg (198 lb 13.7 oz) (07/02 0600)  Filed Weights   09/21/17 0500 09/22/17 0600 09/23/17 0600  Weight: 93.3 kg (205 lb 11 oz) 90.8 kg (200 lb 2.8 oz) 90.2 kg (198 lb 13.7 oz)    Weight change: -0.6 kg (-1 lb 5.2 oz)   Hemodynamic parameters for last 24 hours:    Intake/Output from previous day: 07/01 0701 - 07/02 0700 In: 573.4 [P.O.:240; I.V.:333.4] Out: 1300 [Urine:1300]  Intake/Output this shift: No intake/output data recorded.  Current Meds: Scheduled Meds: . amLODipine  10 mg Oral Daily  . aspirin EC  325 mg Oral Daily  . atorvastatin  80 mg Oral q1800  . Chlorhexidine Gluconate Cloth  6 each Topical Daily  . Chlorhexidine Gluconate Cloth  6 each Topical Daily  . docusate sodium  200 mg Oral Daily  . enoxaparin (LOVENOX) injection  40 mg Subcutaneous QHS  . furosemide  20 mg Oral BID  . glipiZIDE  5 mg Oral BID AC  . hydrochlorothiazide  25 mg Oral Daily  . insulin aspart  0-24 Units Subcutaneous TID AC & HS  . insulin aspart  3 Units Subcutaneous TID WC  . insulin detemir  20 Units Subcutaneous BID  . mouth rinse  15 mL Mouth Rinse BID  . metFORMIN  1,000 mg Oral BID WC  . omega-3 acid ethyl esters  1 g Oral TID  . pantoprazole  40 mg  Oral QAC breakfast  . potassium chloride  20 mEq Oral Daily  . sodium chloride flush  3 mL Intravenous Q12H   Continuous Infusions: . sodium chloride    . amiodarone 30 mg/hr (09/23/17 0600)   PRN Meds:.sodium chloride, acetaminophen, ALPRAZolam, alum & mag hydroxide-simeth, bisacodyl **OR** bisacodyl, meclizine, ondansetron **OR** ondansetron (ZOFRAN) IV, sodium chloride flush, sodium chloride flush, tiZANidine, traMADol  General appearance: alert, cooperative and no distress Heart: regular rate and rhythm Lungs: fair air exchange throughout, dim moreso in bases Abdomen: soft, nontender Extremities: min edema Wound: incis healing well  Lab Results: CBC: Recent Labs    09/22/17 0450 09/23/17 0421  WBC 7.8 7.5  HGB 9.1* 9.1*  HCT 28.9* 29.0*  PLT 213 245   BMET:  Recent Labs    09/22/17 0450 09/23/17 0421  NA 140 135  K 3.4* 3.4*  CL 101 99  CO2 30 29  GLUCOSE 84 164*  BUN 17 17  CREATININE 0.99 1.19*  CALCIUM 8.8* 8.5*    CMET: Lab Results  Component Value Date   WBC 7.5 09/23/2017   HGB 9.1 (L) 09/23/2017  HCT 29.0 (L) 09/23/2017   PLT 245 09/23/2017   GLUCOSE 164 (H) 09/23/2017   CHOL 152 09/13/2017   TRIG 209 (H) 09/13/2017   HDL 35 (L) 09/13/2017   LDLCALC 75 09/13/2017   ALT 23 09/13/2017   AST 32 09/13/2017   NA 135 09/23/2017   K 3.4 (L) 09/23/2017   CL 99 09/23/2017   CREATININE 1.19 (H) 09/23/2017   BUN 17 09/23/2017   CO2 29 09/23/2017   TSH 1.064 09/12/2017   INR 1.33 09/18/2017   HGBA1C 7.4 (H) 09/12/2017      PT/INR: No results for input(s): LABPROT, INR in the last 72 hours. Radiology: Dg Chest 2 View  Result Date: 09/23/2017 CLINICAL DATA:  Postop check. EXAM: CHEST - 2 VIEW COMPARISON:  Radiograph of September 22, 2017. FINDINGS: Stable cardiomediastinal silhouette. Status post coronary artery bypass graft. Left-sided PICC line is unchanged in position. No pneumothorax is noted. Minimal bilateral pleural effusions are noted. Mild left  basilar subsegmental atelectasis is noted. Bony thorax is unremarkable. IMPRESSION: Mild left basilar subsegmental atelectasis. Minimal bilateral pleural effusions. Electronically Signed   By: Marijo Conception, M.D.   On: 09/23/2017 07:18     Assessment/Plan: S/P Procedure(s) (LRB): CORONARY ARTERY BYPASS GRAFTING (CABG) (N/A) TRANSESOPHAGEAL ECHOCARDIOGRAM (TEE) (N/A)  1 overall doing well 2 cont IV amio load per protocol, then transition to po 3 BP is well controlled on current management 4 sats good on RA, cont pulm toilet /IS 5 replace K+  6 creat up slightly, cont gentle diuresis and monitor closely 7 TSH in normal range 8 HgB A1c 7.4, sugars ok currently on insulin, will need to transition to home meds and will need more aggressive management of dietary intake of CHO's and sugars 9 H/O apixaban use for DVT/PE, should be able to start soon, H/H and platelets are stable Karen Tate 09/23/2017 8:39 AM  Pager [phone]  afib last night Not on beta blocker , listed as allery Pacing wires still in, start apixaban when wires are out  Waiting to e4 bed  I have seen and examined Karen Tate and agree with the above assessment  and plan.  [name] MD [place] [phone] Office [phone] 09/23/2017 2:07 PM

## 2017-09-23 NOTE — Plan of Care (Signed)
  Problem: Education: Goal: Knowledge of General Education information will improve Outcome: Progressing   Problem: Health Behavior/Discharge Planning: Goal: Ability to manage health-related needs will improve Outcome: Progressing   Problem: Clinical Measurements: Goal: Ability to maintain clinical measurements within normal limits will improve Outcome: Progressing Goal: Will remain free from infection Outcome: Progressing Goal: Diagnostic test results will improve Outcome: Progressing Goal: Respiratory complications will improve Outcome: Progressing Goal: Cardiovascular complication will be avoided Outcome: Progressing   Problem: Activity: Goal: Risk for activity intolerance will decrease Outcome: Progressing   Problem: Nutrition: Goal: Adequate nutrition will be maintained Outcome: Progressing   Problem: Coping: Goal: Level of anxiety will decrease Outcome: Progressing   Problem: Elimination: Goal: Will not experience complications related to bowel motility Outcome: Progressing Goal: Will not experience complications related to urinary retention Outcome: Progressing   Problem: Pain Managment: Goal: General experience of comfort will improve Outcome: Progressing   Problem: Safety: Goal: Ability to remain free from injury will improve Outcome: Progressing   Problem: Skin Integrity: Goal: Risk for impaired skin integrity will decrease Outcome: Progressing   Problem: Education: Goal: Understanding of cardiac disease, CV risk reduction, and recovery process will improve Outcome: Progressing   Problem: Activity: Goal: Ability to tolerate increased activity will improve Outcome: Progressing   Problem: Cardiac: Goal: Ability to achieve and maintain adequate cardiovascular perfusion will improve Outcome: Progressing   Problem: Health Behavior/Discharge Planning: Goal: Ability to safely manage health-related needs after discharge will improve Outcome:  Progressing   Problem: Education: Goal: Ability to demonstrate proper wound care will improve Outcome: Progressing Goal: Knowledge of disease or condition will improve Outcome: Progressing Goal: Knowledge of the prescribed therapeutic regimen will improve Outcome: Progressing   Problem: Activity: Goal: Risk for activity intolerance will decrease Outcome: Progressing   Problem: Cardiac: Goal: Hemodynamic stability will improve Outcome: Progressing   Problem: Clinical Measurements: Goal: Postoperative complications will be avoided or minimized Outcome: Progressing   Problem: Respiratory: Goal: Respiratory status will improve Outcome: Progressing   Problem: Skin Integrity: Goal: Wound healing without signs and symptoms of infection Outcome: Progressing Goal: Risk for impaired skin integrity will decrease Outcome: Progressing   Problem: Urinary Elimination: Goal: Ability to achieve and maintain adequate renal perfusion and functioning will improve Outcome: Progressing

## 2017-09-23 NOTE — Progress Notes (Addendum)
Occupational Therapy Treatment Patient Details Name: Karen Tate MRN: [id number] DOB: 1945-05-24 Today's Date: 09/23/2017    History of present illness Pt is a 72 y.o. 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 PMH includes  HTN, PE, DMII, glaucoma, arthritis, lumbar fusion.   OT comments  Pt progressing towards established OT goals. Providing pt with handout on sternal precaution and reviewing all information; pt verbalized understanding. Pt donning underwear with AE and Min Guard A for safety in standing. Pt performing stand pivot to recliner with Min A. During LB dressing, pt reporting she felt poorly and clammy stating "I think my sugar is low." Notified RN who checked blood sugar; blood sugar at 71. Providing orange juice and gram crackers with peanut butter. Continue to recommend dc home with HHOT and will continue to follow acutely as admitted.   SpO2 high 90s on RA BP 144/76 HR 123 with activity   Follow Up Recommendations  Home health OT;Supervision/Assistance - 24 hour    Equipment Recommendations  3 in 1 bedside commode    Recommendations for Other Services PT consult    Precautions / Restrictions Precautions Precautions: Sternal;Fall Precaution Booklet Issued: Yes (comment) Precaution Comments: Reviewed sternal precautions handout. Pt verablizing understanding. Restrictions Weight Bearing Restrictions: Yes(sternal precautions) Other Position/Activity Restrictions: Sternal precautions       Mobility Bed Mobility Overal bed mobility: Needs Assistance Bed Mobility: Rolling;Sidelying to Sit Rolling: Min guard Sidelying to sit: Mod assist       General bed mobility comments: Mod A to elevate trunk from sidelying to EOB  Transfers Overall transfer level: Needs assistance Equipment used: Rolling walker (2 wheeled) Transfers: Sit to/from Stand Sit to Stand: Min guard         General transfer comment: Min GUard for safety    Balance Overall balance assessment: Needs  assistance Sitting-balance support: No upper extremity supported;Feet supported Sitting balance-Leahy Scale: Fair     Standing balance support: No upper extremity supported;During functional activity Standing balance-Leahy Scale: Fair Standing balance comment: Able to maintain static standing without UE support                           ADL either performed or assessed with clinical judgement   ADL Overall ADL's : Needs assistance/impaired                     Lower Body Dressing: Sit to/from stand;Min guard Lower Body Dressing Details (indicate cue type and reason): Providing education on useof reacher for donning pants and adhereing to sternal precautions. Pt donning underwear with MIn Guard A for safety duirng transfer and use of AE to adhere to sternal precautions. Pt demonstrating understanding. During LB dressing, pt reporting she feels clammy and fatigued stating "I think my sugar is low." Notifiing RN, who checked pt's blood sugar. Blood sugar was 71. Provided orange juice and gram crackers with peanut butter.  Toilet Transfer: Minimal assistance;Stand-pivot(SImulated to recliner)           Functional mobility during ADLs: Minimal assistance(Stand pivot only) General ADL Comments: Pt continues to be highly motivated to partcipate in therapy. Pt with drop is blood sugar. Notifing RN who checked blood sugar (71). Providing orange juice and peanut buutter crackers     Vision       Perception     Praxis      Cognition Arousal/Alertness: Awake/alert Behavior During Therapy: WFL for tasks assessed/performed Overall Cognitive Status: Within Functional Limits for  tasks assessed                                          Exercises     Shoulder Instructions       General Comments Daughter and husband present. Blood sugar dropping to 71. RN present throughout episode.     Pertinent Vitals/ Pain       Pain Assessment: Faces Faces Pain  Scale: Hurts little more Pain Location: Chest Pain Descriptors / Indicators: Constant;Discomfort;Grimacing Pain Intervention(s): Monitored during session;Limited activity within patient's tolerance;Repositioned  Home Living                                          Prior Functioning/Environment              Frequency  Min 3X/week        Progress Toward Goals  OT Goals(current goals can now be found in the care plan section)     Acute Rehab OT Goals Patient Stated Goal: Feel safe and strong before returning home OT Goal Formulation: With patient Time For Goal Achievement: 10/06/17 Potential to Achieve Goals: Good ADL Goals Pt Will Perform Upper Body Dressing: with set-up;with supervision;sitting Pt Will Perform Lower Body Dressing: with set-up;with supervision;sit to/from stand;with adaptive equipment;with caregiver independent in assisting Pt Will Transfer to Toilet: with set-up;with supervision;ambulating;regular height toilet Pt Will Perform Toileting - Clothing Manipulation and hygiene: with set-up;with supervision;sit to/from stand Pt Will Perform Tub/Shower Transfer: Tub transfer;3 in 1;rolling walker;ambulating;with min guard assist Additional ADL Goal #1: Pt will adhere to sternal precautions during ADLs with 2-3 cues  Plan      Co-evaluation                 AM-PAC PT "6 Clicks" Daily Activity     Outcome Measure   Help from another person eating meals?: A Little Help from another person taking care of personal grooming?: A Little Help from another person toileting, which includes using toliet, bedpan, or urinal?: A Little Help from another person bathing (including washing, rinsing, drying)?: A Lot Help from another person to put on and taking off regular upper body clothing?: A Little Help from another person to put on and taking off regular lower body clothing?: A Lot 6 Click Score: 16    End of Session Equipment Utilized During  Treatment: Gait belt;Rolling walker  OT Visit Diagnosis: Unsteadiness on feet (R26.81);Other abnormalities of gait and mobility (R26.89);Muscle weakness (generalized) (M62.81)   Activity Tolerance Patient tolerated treatment well   Patient Left in chair;with call bell/phone within reach;with family/visitor present   Nurse Communication Mobility status;Precautions        Time: 1435-1510 OT Time Calculation (min): 35 min  Charges: OT General Charges $OT Visit: 1 Visit OT Treatments $Self Care/Home Management : 23-37 mins  [place], OTR/L Acute Rehab Pager: [phone] Office: [place] 09/23/2017, 3:28 PM

## 2017-09-23 NOTE — Progress Notes (Signed)
Patient ID: Karen Tate, female   DOB: 04-22-1945, 72 y.o.   MRN: [id number]  TCTS Evening Rounds:  Hemodynamically stable in sinus rhythm  No problems today  Waiting on 4E bed

## 2017-09-24 LAB — GLUCOSE, CAPILLARY
Glucose-Capillary: 130 mg/dL — ABNORMAL HIGH (ref 70–99)
Glucose-Capillary: 93 mg/dL (ref 70–99)
Glucose-Capillary: 98 mg/dL (ref 70–99)
Glucose-Capillary: 104 mg/dL — ABNORMAL HIGH (ref 70–99)

## 2017-09-24 LAB — BASIC METABOLIC PANEL
Anion gap: 9 (ref 5–15)
BUN: 21 mg/dL (ref 8–23)
CO2: 28 mmol/L (ref 22–32)
Calcium: 8.9 mg/dL (ref 8.9–10.3)
Chloride: 98 mmol/L (ref 98–111)
Creatinine, Ser: 1.32 mg/dL — ABNORMAL HIGH (ref 0.44–1.00)
GFR calc Af Amer: 45 mL/min — ABNORMAL LOW (ref >60)
GFR calc non Af Amer: 39 mL/min — ABNORMAL LOW (ref >60)
Glucose, Bld: 175 mg/dL — ABNORMAL HIGH (ref 70–99)
Potassium: 4.2 mmol/L (ref 3.5–5.1)
Sodium: 135 mmol/L (ref 135–145)

## 2017-09-24 MED ORDER — AMIODARONE HCL 200 MG PO TABS
200.0000 mg | ORAL_TABLET | Freq: Two times a day (BID) | ORAL | Status: DC
  Administered 2017-09-24 – 2017-09-25 (×3): 200 mg via ORAL
  Filled 2017-09-24 (×3): qty 1

## 2017-09-24 MED ORDER — AMIODARONE HCL 200 MG PO TABS: 200.0000 mg | ORAL_TABLET | Freq: Every day | ORAL | Status: DC

## 2017-09-24 MED ORDER — AMIODARONE HCL 200 MG PO TABS: 200.0000 mg | ORAL_TABLET | Freq: Two times a day (BID) | ORAL | Status: DC

## 2017-09-24 MED ORDER — APIXABAN 5 MG PO TABS
5.0000 mg | ORAL_TABLET | Freq: Two times a day (BID) | ORAL | Status: DC
  Administered 2017-09-24 – 2017-09-25 (×3): 5 mg via ORAL
  Filled 2017-09-24 (×3): qty 1

## 2017-09-24 NOTE — Progress Notes (Signed)
[id number] Pt just walked with PT.  Education completed with pt and family who voiced understanding. Reviewed sternal precautions, IS, carb counting and encouraged walking as tolerated with back issues. Discussed CRP 2 and referral made to [facility] program.  Wrote down how to view discharge video. Graylon Good RN BSN 09/24/2017 3:24 PM

## 2017-09-24 NOTE — Progress Notes (Addendum)
[place] [address]       [place], [address]             [phone]      6 Days Post-Op Procedure(s) (LRB): CORONARY ARTERY BYPASS GRAFTING (CABG) (N/A) TRANSESOPHAGEAL ECHOCARDIOGRAM (TEE) (N/A) Subjective: conts to progress nicely, in sinus rhythm, transitioning to po amiodarone  Objective: Vital signs in last 24 hours: Temp:  [97.6 F (36.4 C)-98.5 F (36.9 C)] 98.1 F (36.7 C) (07/03 0757) Cardiac Rhythm: Normal sinus rhythm (07/03 0800) Resp:  [5-31] 27 (07/03 0800) BP: (91-148)/(52-131) 103/52 (07/03 0800) SpO2:  [94 %-100 %] 99 % (07/03 0800) Weight:  [89.5 kg (197 lb 5 oz)] 89.5 kg (197 lb 5 oz) (07/03 0500)  Hemodynamic parameters for last 24 hours:    Intake/Output from previous day: 07/02 0701 - 07/03 0700 In: 615.4 [P.O.:222; I.V.:393.4] Out: 700 [Urine:700] Intake/Output this shift: Total I/O In: 33.3 [I.V.:33.3] Out: -   General appearance: alert, cooperative and no distress Heart: regular rate and rhythm Lungs: clear to auscultation bilaterally Abdomen: benign Extremities: minor edema Wound: incis healing well  Lab Results: Recent Labs    09/22/17 0450 09/23/17 0421  WBC 7.8 7.5  HGB 9.1* 9.1*  HCT 28.9* 29.0*  PLT 213 245   BMET:  Recent Labs    09/23/17 0421 09/24/17 0356  NA 135 135  K 3.4* 4.2  CL 99 98  CO2 29 28  GLUCOSE 164* 175*  BUN 17 21  CREATININE 1.19* 1.32*  CALCIUM 8.5* 8.9    PT/INR: No results for input(s): LABPROT, INR in the last 72 hours. ABG    Component Value Date/Time   PHART 7.330 (L) 09/18/2017 2228   HCO3 20.5 09/18/2017 2228   TCO2 21 (L) 09/19/2017 1909   ACIDBASEDEF 5.0 (H) 09/18/2017 2228   O2SAT 97.0 09/18/2017 2228   CBG (last 3)  Recent Labs    09/23/17 1828 09/23/17 2141 09/24/17 0649  GLUCAP 139* 135* 130*    Meds Scheduled Meds: . amiodarone  200 mg Oral Q12H   Followed by  . [START ON 10/02/2017] amiodarone  200 mg Oral Daily  . amLODipine  10 mg Oral Daily    . aspirin EC  325 mg Oral Daily  . atorvastatin  80 mg Oral q1800  . benazepril  10 mg Oral Daily  . Chlorhexidine Gluconate Cloth  6 each Topical Daily  . docusate sodium  200 mg Oral Daily  . enoxaparin (LOVENOX) injection  40 mg Subcutaneous QHS  . furosemide  20 mg Oral BID  . glipiZIDE  5 mg Oral BID AC  . hydrochlorothiazide  25 mg Oral Daily  . insulin aspart  0-24 Units Subcutaneous TID AC & HS  . insulin aspart  3 Units Subcutaneous TID WC  . insulin detemir  13 Units Subcutaneous BID  . metFORMIN  1,000 mg Oral BID WC  . omega-3 acid ethyl esters  1 g Oral TID  . pantoprazole  40 mg Oral QAC breakfast  . potassium chloride  30 mEq Oral BID  . sodium chloride flush  3 mL Intravenous Q12H   Continuous Infusions: . sodium chloride    . amiodarone 30 mg/hr (09/24/17 0800)   PRN Meds:.sodium chloride, acetaminophen, ALPRAZolam, alum & mag hydroxide-simeth, bisacodyl **OR** bisacodyl, meclizine, ondansetron **OR** ondansetron (ZOFRAN) IV, sodium chloride flush, sodium chloride flush, tiZANidine, traMADol  Xrays Dg Chest 2 View  Result Date: 09/23/2017 CLINICAL DATA:  Postop check. EXAM: CHEST -  2 VIEW COMPARISON:  Radiograph of September 22, 2017. FINDINGS: Stable cardiomediastinal silhouette. Status post coronary artery bypass graft. Left-sided PICC line is unchanged in position. No pneumothorax is noted. Minimal bilateral pleural effusions are noted. Mild left basilar subsegmental atelectasis is noted. Bony thorax is unremarkable. IMPRESSION: Mild left basilar subsegmental atelectasis. Minimal bilateral pleural effusions. Electronically Signed   By: Marijo Conception, M.D.   On: 09/23/2017 07:18    Assessment/Plan: S/P Procedure(s) (LRB): CORONARY ARTERY BYPASS GRAFTING (CABG) (N/A) TRANSESOPHAGEAL ECHOCARDIOGRAM (TEE) (N/A) 1 hemodyn stable in sinus rhythm, transitioning to po amiodarone, some PVC's 2 BP control is good on current RX but creat slowly rising, will d/c lotensin for  now and  3 sats good on RA, cont routine pulm toilet/ambulation is steadily improving 4 creat slowly rising, will stop lotensin as BP relatively low at times, stop lasix for now and see how she does with hydrodiuril, Low EF preop but not significantly volume overloaded currently 5 BS adeq control- stop insulin and see how she does, needs aggressive lifestyle/nutrition management long term 6 H/H stable , platelets improved, d/c wires and restart apixaban 7 awaiting bed on 4 e 8 poss home 1-2 days  LOS: 12 days    Karen Tate 09/24/2017   Pacing wires out now , resume xarelto when goes home for dvt/pe in past  I have seen and examined [name] and agree with the above assessment  and plan.  [name] MD Beeper [phone] Office [phone] 09/24/2017 5:41 PM

## 2017-09-24 NOTE — Progress Notes (Signed)
Patient arrived from [facility] to 4E room 12 post CABG x3.  Telemetry monitor applied and CCMD notified.  Patient oriented to unit and room to include call light and phone.  Will continue to monitor.

## 2017-09-24 NOTE — Discharge Summary (Signed)
Physician Discharge Summary  Patient ID: JAIDAH LOMAX MRN: [id number] DOB/AGE: 1946-02-23 72 y.o.  Admit date: 09/12/2017 Discharge date: 09/29/2017  Admission Diagnoses: Unstable angina  Discharge Diagnoses:  Active Problems:   Unstable angina (HCC)   Chest pain   Non-STEMI (non-ST elevated myocardial infarction) ([place])   Pulmonary embolus (HCC)   Type 2 diabetes mellitus (HCC)   S/P CABG x 3   Postop check   Coronary artery disease involving native heart without angina pectoris  Patient Active Problem List   Diagnosis Date Noted  . Coronary artery disease involving native heart without angina pectoris   . Postop check   . S/P CABG x 3 09/18/2017  . Pulmonary embolus ([place]) 09/15/2017  . Type 2 diabetes mellitus ([place]) 09/15/2017  . Non-STEMI (non-ST elevated myocardial infarction) ([place])   . Unstable angina ([place]) 09/12/2017  . AP (angina pectoris) ([place])   . Abnormal stress test   . Chest pain   . Spondylolisthesis of lumbar region 09/28/2014   History of the present illness:  Patient is a 72 year old female with no prior cardiac history but multiple cardiac risk factors including hypertension, hyperlipidemia, diabetes and a strong family history who has recently presented for evaluation of exertional chest pain.  She developed unstable chest pain during a Lexiscan Myoview and ruled in for non-STEMI with a troponin of 4.33 after being sent to the emergency department.  She subsequently underwent cardiac catheterization and was found to have severe two-vessel coronary artery disease and impaired left ventricular function.  Due to these findings cardiothoracic surgical consultation was obtained with Erasmo Leventhal, MD who evaluated the patient and studies and agreed with recommendations to proceed with coronary artery surgical revascularization.   Discharged Condition: good  Hospital Course: The patient was admitted electively for cardiac catheterization.  She was found to to  have severe multivessel coronary artery disease as described above.  Due to these findings surgical consultation was obtained and surgery was scheduled.  On 09/18/2017 she was taken to the operating room where she underwent the below described procedure.  She tolerated it well was taken to the surgical intensive care unit in stable condition.  Postoperative hospital course:  Patient is overall progressed nicely.  She was weaned from the ventilator without difficulty using standard protocols.  Arterial and [place] removed on postoperative day #1.  She initially required some higher dose nitroglycerin due to early hypertension but was transitioned to hydrochlorothiazide and Norvasc.  Eventually ARB and hydralazine are also added to the regimen.  Patient did have some postoperative volume overload but did respond well to diuretics.  Diabetes is been under control using standard protocols.  Patient initially maintained in normal sinus rhythm but did have some episodes of postoperative atrial fibrillation which was chemically cardioverted to sinus rhythm with amiodarone.  She was transition from intravenous to oral usage.  Her TSH is noted to be normal range.  Patient did require assistance with physical therapy for postoperative mobilization and rehabilitation but has shown excellent overall progress in this regard.  She did have some early increase in creatinine but this has shown a improving trend over time most recent on 09/25/2017 is 1.25 with a BUN of 21.  The patient does have a postoperative anemia and values are stable.  Most recent hemoglobin and hematocrit are 8.8 and 28.1 respectively on 09/25/2017.  Incisions are noted to be healing well without evidence of infection.  All routine lines monitors and drainage devices have been discontinued in the standard  fashion.  Room air oxygen saturations are good and at the time of discharge she is felt to be quite stable.   Consults: cardiology  Significant  Diagnostic Studies: Routine postoperative laboratory and serial chest x-rays  Treatments: surgery:   OPERATIVE REPORT  DATE OF PROCEDURE:  09/18/2017  PREOPERATIVE DIAGNOSIS:  Severe 2-vessel coronary artery disease, status post non-ST elevation myocardial infarction.  POSTOPERATIVE DIAGNOSIS:  Severe 2-vessel coronary artery disease, status post non-ST elevation myocardial infarction.  PROCEDURE:  Median sternotomy, extracorporeal circulation, coronary artery bypass grafting x3 (left internal mammary artery to left anterior descending, sequential saphenous vein graft to ramus intermedius branches 1 and 2), endoscopic vein harvest,  right thigh.  SURGEON:  Modesto Charon, MD  ASSISTANT:  Ellwood Handler, PA-C   ANESTHESIA:  General.  FINDINGS:  Mammary artery and saphenous vein good quality.  Targets fair quality, diffusely diseased.  Transesophageal echocardiography showed ejection fraction of 35% to 40% with anterior, anterolateral, and inferior hypokinesis.  Slight improvement  post-bypass.  No significant valvular pathology.   Discharge Exam: Blood pressure 122/75, pulse 82, temperature 98.1 F (36.7 C), temperature source Oral, resp. rate (!) 27, height 5\' 5"  (1.651 m), weight 89 kg (196 lb 3.4 oz), SpO2 95 %.   General appearance: alert, cooperative and no distress Heart: regular rate and rhythm Lungs: clear to auscultation bilaterally Abdomen: benign Extremities: + min edema Wound: incis healing well   Disposition: Discharge disposition: 01-Home or Self Care     Discharge home  Discharge Instructions    Amb Referral to Cardiac Rehabilitation   Complete by:  As directed    Diagnosis:   CABG NSTEMI     CABG X ___:  3   Discharge patient   Complete by:  As directed    Discharge disposition:  01-Home or Self Care   Discharge patient date:  09/25/2017     Allergies as of 09/25/2017      Reactions   Beta Adrenergic Blockers Other (See Comments)    Dropped heart rate to the 40's   Calcium Channel Blockers Other (See Comments)   Dropped HR into the 40's   Naproxen Hives      Medication List    STOP taking these medications   benazepril 40 MG tablet Commonly known as:  LOTENSIN   Black Cohosh 540 MG Caps   hydrALAZINE 25 MG tablet Commonly known as:  APRESOLINE   isosorbide mononitrate 30 MG 24 hr tablet Commonly known as:  IMDUR   potassium chloride SA 20 MEQ tablet Commonly known as:  K-DUR,KLOR-CON     TAKE these medications   ALPRAZolam 0.5 MG tablet Commonly known as:  XANAX Take 0.5 mg by mouth 2 (two) times daily.   amiodarone 200 MG tablet Commonly known as:  PACERONE Take 1 tablet (200 mg total) by mouth every 12 (twelve) hours. For 7 days, then once daily   amLODipine 10 MG tablet Commonly known as:  NORVASC Take 10 mg by mouth daily.   aspirin 325 MG EC tablet Take 1 tablet (325 mg total) by mouth daily. What changed:    medication strength  how much to take   atorvastatin 80 MG tablet Commonly known as:  LIPITOR Take 1 tablet (80 mg total) by mouth daily at 6 PM.   ELIQUIS 5 MG Tabs tablet Generic drug:  apixaban Take 5 mg by mouth 2 (two) times daily.   fish oil-omega-3 fatty acids 1000 MG capsule Take 1 g by mouth 3 (three) times  daily.   glipiZIDE 5 MG tablet Commonly known as:  GLUCOTROL Take 5 mg by mouth 2 (two) times daily before a meal.   hydrochlorothiazide 25 MG tablet Commonly known as:  HYDRODIURIL Take 25 mg by mouth daily.   meclizine 25 MG tablet Commonly known as:  ANTIVERT Take 25 mg by mouth 3 (three) times daily as needed for dizziness.   metFORMIN 1000 MG tablet Commonly known as:  GLUCOPHAGE Take 1,000 mg by mouth 2 (two) times daily with a meal.   omeprazole 40 MG capsule Commonly known as:  PRILOSEC Take 40 mg by mouth daily.   tiZANidine 4 MG tablet Commonly known as:  ZANAFLEX Take 1 tablet (4 mg total) by mouth every 6 (six) hours as needed for  muscle spasms.   traMADol 50 MG tablet Commonly known as:  ULTRAM Take 1 tablet (50 mg total) by mouth every 6 (six) hours as needed for up to 7 days for moderate pain.      Follow-up Information    Lorretta Harp, MD Follow up.   Specialties:  Cardiology, Radiology Why:  Please see discharge paperwork for a 2-week cardiologist follow-up appointment. Contact information: [address] [place] [address] [phone]        [name], MD Follow up.   Specialty:  Cardiothoracic Surgery Why:  Appointment with the physician assistant on 10/20/2017 at 2:30 PM.  Please obtain a chest x-ray at [facility] at 2 PM.  [facility] imaging is located at the same office complex on the first floor. Contact information: [address] [place] [address] [phone]          The patient has been discharged on:   1.Beta Blocker:  Yes [   ]                              No   [n   ]                              If No, reason: Prior history of bradycardia and current amiodarone use  2.Ace Inhibitor/ARB: Yes [   ]                                     No  [ n   ]                                     If No, reason: Renal insufficiency  3.Statin:   Yes [ y  ]                  No  [   ]                  If No, reason:  4.Ecasa:  Yes  [ y  ]                  No   [   ]                  If No, reason:  Signed: John Giovanni 09/29/2017, 3:10 PM

## 2017-09-24 NOTE — Progress Notes (Signed)
Occupational Therapy Treatment Patient Details Name: Karen Tate MRN: [id number] DOB: 1945/10/16 Today's Date: 09/24/2017    History of present illness Pt is a 72 y.o. 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 PMH includes  HTN, PE, DMII, glaucoma, arthritis, lumbar fusion.   OT comments  Pt progressing well towards established OT goals. Continuing education on LB ADLs with AE. Pt demonstrating understanding by donning/doffing socks with AE and Min Guard. Educating pt on safe tub transfer, pt performing simulated tub transfer with Min A. Continue to recommend dc to home with HHOT and will continue to follow acutely as admitted.    Follow Up Recommendations  Home health OT;Supervision/Assistance - 24 hour    Equipment Recommendations  3 in 1 bedside commode    Recommendations for Other Services PT consult    Precautions / Restrictions Precautions Precautions: Sternal;Fall Precaution Booklet Issued: Yes (comment) Precaution Comments: Reviewed sternal precautions handout. Pt verablizing understanding. Restrictions Weight Bearing Restrictions: Yes RUE Weight Bearing: Non weight bearing Other Position/Activity Restrictions: Sternal precautions       Mobility Bed Mobility Overal bed mobility: Needs Assistance Bed Mobility: Rolling;Sidelying to Sit Rolling: Supervision Sidelying to sit: Min assist       General bed mobility comments: Min A to initate side lying to upright posture at EOB.   Transfers Overall transfer level: Needs assistance Equipment used: Rolling walker (2 wheeled) Transfers: Sit to/from Stand Sit to Stand: Supervision         General transfer comment: supervision for safety. demonstrating good and safe technique    Balance Overall balance assessment: Needs assistance Sitting-balance support: No upper extremity supported Sitting balance-Leahy Scale: Fair     Standing balance support: No upper extremity supported Standing balance-Leahy Scale: Fair Standing  balance comment: Able to maintain static standing without UE support                           ADL either performed or assessed with clinical judgement   ADL Overall ADL's : Needs assistance/impaired           Upper Body Bathing Details (indicate cue type and reason): Discussed use of AE for UB ADLs. Pt demonstrating understanding     Upper Body Dressing : Sitting;Set up;Supervision/safety;Cueing for sequencing;Cueing for compensatory techniques Upper Body Dressing Details (indicate cue type and reason): Educating pt on compensatory techniques for UB dressing while adhering to sternal precautions. Pt demonstrating understanding by donning.doffing shirt with educaitonal cues.  Lower Body Dressing: Min guard;Sit to/from stand;With adaptive equipment Lower Body Dressing Details (indicate cue type and reason): Continued education on use of AE for LB dressing. Verably reviewed donning/doffing pants. Pt demonstrating understanding for donning/doffing socks with AE.         Tub/ Shower Transfer: Minimal assistance;Ambulation;Shower Technical sales engineer Details (indicate cue type and reason): Educating pt on tub transfer. Pt attempting to perform technique with RW, but prior TKA limited ROM of LLE. Pt performing transfer with use of wall steping side ways. Requiring Min A for stability.  Functional mobility during ADLs: Min guard;Rolling walker General ADL Comments: Pt demonstrating good progress and is very motivated to participate in therapy. Providing education for dressing, bathing, and safe tub transfer.     Vision       Perception     Praxis      Cognition Arousal/Alertness: Awake/alert Behavior During Therapy: WFL for tasks assessed/performed Overall Cognitive Status: Within Functional Limits for tasks assessed  Exercises     Shoulder Instructions       General Comments VSS     Pertinent Vitals/ Pain       Pain Assessment: Faces Faces Pain Scale: Hurts a little bit Pain Location: Chest Pain Descriptors / Indicators: Constant;Discomfort;Grimacing Pain Intervention(s): Monitored during session;Repositioned  Home Living                                          Prior Functioning/Environment              Frequency  Min 3X/week        Progress Toward Goals  OT Goals(current goals can now be found in the care plan section)  Progress towards OT goals: Progressing toward goals  Acute Rehab OT Goals Patient Stated Goal: Feel safe and strong before returning home OT Goal Formulation: With patient Time For Goal Achievement: 10/06/17 Potential to Achieve Goals: Good ADL Goals Pt Will Perform Upper Body Dressing: with set-up;with supervision;sitting Pt Will Perform Lower Body Dressing: with set-up;with supervision;sit to/from stand;with adaptive equipment;with caregiver independent in assisting Pt Will Transfer to Toilet: with set-up;with supervision;ambulating;regular height toilet Pt Will Perform Toileting - Clothing Manipulation and hygiene: with set-up;with supervision;sit to/from stand Pt Will Perform Tub/Shower Transfer: Tub transfer;3 in 1;rolling walker;ambulating;with min guard assist Additional ADL Goal #1: Pt will adhere to sternal precautions during ADLs with 2-3 cues  Plan Discharge plan remains appropriate    Co-evaluation                 AM-PAC PT "6 Clicks" Daily Activity     Outcome Measure   Help from another person eating meals?: A Little Help from another person taking care of personal grooming?: A Little Help from another person toileting, which includes using toliet, bedpan, or urinal?: A Little Help from another person bathing (including washing, rinsing, drying)?: A Lot Help from another person to put on and taking off regular upper body clothing?: A Little Help from another person to put on and  taking off regular lower body clothing?: A Lot 6 Click Score: 16    End of Session Equipment Utilized During Treatment: Rolling walker  OT Visit Diagnosis: Unsteadiness on feet (R26.81);Other abnormalities of gait and mobility (R26.89);Muscle weakness (generalized) (M62.81)   Activity Tolerance Patient tolerated treatment well   Patient Left in chair;with call bell/phone within reach   Nurse Communication Mobility status;Precautions        Time: 1025-8527 OT Time Calculation (min): 37 min  Charges: OT General Charges $OT Visit: 1 Visit OT Treatments $Self Care/Home Management : 23-37 mins  [place], OTR/L Acute Rehab Pager: [phone] Office: [place] 09/24/2017, 4:30 PM

## 2017-09-24 NOTE — Progress Notes (Signed)
Pt's epicardial wires removed per protocol per MD order.  No immediate complications.  Pt monitored closely.  Will continue to monitor and update as needed.

## 2017-09-24 NOTE — Progress Notes (Addendum)
Physical Therapy Treatment Patient Details Name: Karen Tate MRN: [id number] DOB: [birth year] Today's Date: 09/24/2017    History of Present Illness Pt is a 72 y.o. 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 PMH includes  HTN, PE, DMII, glaucoma, arthritis, lumbar fusion.    PT Comments    Patient ambulated 150' on RA, SpO2 remained in 90's, HRmax 108. (80 at rest). Patient demonstrating increased independence with functional transfers and gait, would recommend using RW at this time. Family present, all questions were answered. Agree with HHPT recs.    BP After activity: 141/60       At rest 122/55    Follow Up Recommendations  Home health PT;Supervision - Intermittent     Equipment Recommendations  None recommended by PT    Recommendations for Other Services OT consult     Precautions / Restrictions Precautions Precautions: Sternal;Fall Precaution Booklet Issued: Yes (comment) Precaution Comments: Reviewed sternal precautions handout. Pt verablizing understanding. Restrictions Weight Bearing Restrictions: Yes RUE Weight Bearing: Non weight bearing Other Position/Activity Restrictions: Sternal precautions    Mobility  Bed Mobility Overal bed mobility: Needs Assistance Bed Mobility: Rolling;Sidelying to Sit Rolling: Supervision Sidelying to sit: Supervision          Transfers Overall transfer level: Needs assistance Equipment used: Rolling walker (2 wheeled) Transfers: Sit to/from Stand Sit to Stand: Supervision            Ambulation/Gait Ambulation/Gait assistance: Min guard;Supervision Gait Distance (Feet): 120 Feet Assistive device: 4-wheeled walker Gait Pattern/deviations: Step-through pattern;Decreased stride length;Trunk flexed Gait velocity: decreased   General Gait Details: 120' today, patient HRmax 108, SpO2 in 90s on RA, tele box with 2x unsustained VTACH, RN notified. Pt reports some diziness ambulating. utilizng rollator however patient reports she feels  safer with RW, would use that next time.    Stairs             Wheelchair Mobility    Modified Rankin (Stroke Patients Only)       Balance Overall balance assessment: Needs assistance Sitting-balance support: No upper extremity supported Sitting balance-Leahy Scale: Fair     Standing balance support: No upper extremity supported Standing balance-Leahy Scale: Fair                              Cognition Arousal/Alertness: Awake/alert Behavior During Therapy: WFL for tasks assessed/performed Overall Cognitive Status: Within Functional Limits for tasks assessed                                        Exercises      General Comments        Pertinent Vitals/Pain Pain Assessment: Faces Faces Pain Scale: Hurts a little bit Pain Location: Chest Pain Descriptors / Indicators: Constant;Discomfort;Grimacing Pain Intervention(s): Limited activity within patient's tolerance;Monitored during session;Premedicated before session    Home Living                      Prior Function            PT Goals (current goals can now be found in the care plan section) Acute Rehab PT Goals Patient Stated Goal: Feel safe and strong before returning home PT Goal Formulation: With patient Time For Goal Achievement: 10/05/17 Potential to Achieve Goals: Good Progress towards PT goals: Progressing toward goals    Frequency  Min 3X/week      PT Plan Current plan remains appropriate    Co-evaluation              AM-PAC PT "6 Clicks" Daily Activity  Outcome Measure  Difficulty turning over in bed (including adjusting bedclothes, sheets and blankets)?: A Little Difficulty moving from lying on back to sitting on the side of the bed? : A Little Difficulty sitting down on and standing up from a chair with arms (e.g., wheelchair, bedside commode, etc,.)?: A Little Help needed moving to and from a bed to chair (including a wheelchair)?: A  Little Help needed walking in hospital room?: A Little Help needed climbing 3-5 steps with a railing? : A Little 6 Click Score: 18    End of Session Equipment Utilized During Treatment: Gait belt Activity Tolerance: Patient tolerated treatment well Patient left: in chair;with call bell/phone within reach Nurse Communication: Mobility status PT Visit Diagnosis: Other abnormalities of gait and mobility (R26.89)     Time: 1430-1500 PT Time Calculation (min) (ACUTE ONLY): 30 min  Charges:  $Gait Training: 8-22 mins $Therapeutic Activity: 8-22 mins                    G Codes:       Reinaldo Berber, PT, DPT Acute Rehab Services Pager: [phone]    Reinaldo Berber 09/24/2017, 3:19 PM

## 2017-09-24 NOTE — Discharge Instructions (Signed)
Endoscopic Saphenous Vein Harvesting, Care After °Refer to this sheet in the next few weeks. These instructions provide you with information about caring for yourself after your procedure. Your health care provider may also give you more specific instructions. Your treatment has been planned according to current medical practices, but problems sometimes occur. Call your health care provider if you have any problems or questions after your procedure. °What can I expect after the procedure? °After the procedure, it is common to have: °· Pain. °· Bruising. °· Swelling. °· Numbness. ° °Follow these instructions at home: °Medicine °· Take over-the-counter and prescription medicines only as told by your health care provider. °· Do not drive or operate heavy machinery while taking prescription pain medicine. °Incision care ° °· Follow instructions from your health care provider about how to take care of the cut made during surgery (incision). Make sure you: °? Wash your hands with soap and water before you change your bandage (dressing). If soap and water are not available, use hand sanitizer. °? Change your dressing as told by your health care provider. °? Leave stitches (sutures), skin glue, or adhesive strips in place. These skin closures may need to be in place for 2 weeks or longer. If adhesive strip edges start to loosen and curl up, you may trim the loose edges. Do not remove adhesive strips completely unless your health care provider tells you to do that. °· Check your incision area every day for signs of infection. Check for: °? More redness, swelling, or pain. °? More fluid or blood. °? Warmth. °? Pus or a bad smell. °General instructions °· Raise (elevate) your legs above the level of your heart while you are sitting or lying down. °· Do any exercises your health care providers have given you. These may include deep breathing, coughing, and walking exercises. °· Do not shower, take baths, swim, or use a hot tub  unless told by your health care provider. °· Wear your elastic stocking if told by your health care provider. °· Keep all follow-up visits as told by your health care provider. This is important. °Contact a health care provider if: °· Medicine does not help your pain. °· Your pain gets worse. °· You have new leg bruises or your leg bruises get bigger. °· You have a fever. °· Your leg feels numb. °· You have more redness, swelling, or pain around your incision. °· You have more fluid or blood coming from your incision. °· Your incision feels warm to the touch. °· You have pus or a bad smell coming from your incision. °Get help right away if: °· Your pain is severe. °· You develop pain, tenderness, warmth, redness, or swelling in any part of your leg. °· You have chest pain. °· You have trouble breathing. °This information is not intended to replace advice given to you by your health care provider. Make sure you discuss any questions you have with your health care provider. °Document Released: 11/21/2010 Document Revised: 08/17/2015 Document Reviewed: 01/23/2015 °Elsevier Interactive Patient Education © 2018 [employer] °Coronary Artery Bypass Grafting, Care After °These instructions give you information on caring for yourself after your procedure. Your doctor may also give you more specific instructions. Call your doctor if you have any problems or questions after your procedure. °Follow these instructions at home: °· Only take medicine as told by your doctor. Take medicines exactly as told. Do not stop taking medicines or start any new medicines without talking to your doctor first. °·   Take your pulse as told by your doctor.  Do deep breathing as told by your doctor. Use your breathing device (incentive spirometer), if given, to practice deep breathing several times a day. Support your chest with a pillow or your arms when you take deep breaths or cough.  Keep the area clean, dry, and protected where the  surgery cuts (incisions) were made. Remove bandages (dressings) only as told by your doctor. If strips were applied to surgical area, do not take them off. They fall off on their own.  Check the surgery area daily for puffiness (swelling), redness, or leaking fluid.  If surgery cuts were made in your legs: ? Avoid crossing your legs. ? Avoid sitting for long periods of time. Change positions every 30 minutes. ? Raise your legs when you are sitting. Place them on pillows.  Wear stockings that help keep blood clots from forming in your legs (compression stockings).  Only take sponge baths until your doctor says it is okay to take showers. Pat the surgery area dry. Do not rub the surgery area with a washcloth or towel. Do not bathe, swim, or use a hot tub until your doctor says it is okay.  Eat foods that are high in fiber. These include raw fruits and vegetables, whole grains, beans, and nuts. Choose lean meats. Avoid canned, processed, and fried foods.  Drink enough fluids to keep your pee (urine) clear or pale yellow.  Weigh yourself every day.  Rest and limit activity as told by your doctor. You may be told to: ? Stop any activity if you have chest pain, shortness of breath, changes in heartbeat, or dizziness. Get help right away if this happens. ? Move around often for short amounts of time or take short walks as told by your doctor. Gradually become more active. You may need help to strengthen your muscles and build endurance. ? Avoid lifting, pushing, or pulling anything heavier than 10 pounds (4.5 kg) for at least 6 weeks after surgery.  Do not drive until your doctor says it is okay.  Ask your doctor when you can go back to work.  Ask your doctor when you can begin sexual activity again.  Follow up with your doctor as told. Contact a doctor if:  You have puffiness, redness, more pain, or fluid draining from the incision site.  You have a fever.  You have puffiness in your  ankles or legs.  You have pain in your legs.  You gain 2 or more pounds (0.9 kg) a day.  You feel sick to your stomach (nauseous) or throw up (vomit).  You have watery poop (diarrhea). Get help right away if:  You have chest pain that goes to your jaw or arms.  You have shortness of breath.  You have a fast or irregular heartbeat.  You notice a "clicking" in your breastbone when you move.  You have numbness or weakness in your arms or legs.  You feel dizzy or light-headed. This information is not intended to replace advice given to you by your health care provider. Make sure you discuss any questions you have with your health care provider. Document Released: 03/16/2013 Document Revised: 08/17/2015 Document Reviewed: 08/18/2012 Elsevier Interactive Patient Education  2017 [employer].   -------------------------------------------------------------------------------------------------------------------------------------------------------------------------------- Information on my medicine - ELIQUIS (apixaban)  This medication education was reviewed with me or my healthcare representative as part of my discharge preparation.   Why was Eliquis prescribed for you? Eliquis was prescribed for you  to reduce the risk of forming blood clots that can cause a stroke if you have a medical condition called atrial fibrillation (a type of irregular heartbeat) OR to reduce the risk of a blood clots forming after orthopedic surgery.  What do You need to know about Eliquis ? Take your Eliquis TWICE DAILY - one tablet in the morning and one tablet in the evening with or without food.  It would be best to take the doses about the same time each day.  If you have difficulty swallowing the tablet whole please discuss with your pharmacist how to take the medication safely.  Take Eliquis exactly as prescribed by your doctor and DO NOT stop taking Eliquis without talking to the doctor who  prescribed the medication.  Stopping may increase your risk of developing a new clot or stroke.  Refill your prescription before you run out.  After discharge, you should have regular check-up appointments with your healthcare provider that is prescribing your Eliquis.  In the future your dose may need to be changed if your kidney function or weight changes by a significant amount or as you get older.  What do you do if you miss a dose? If you miss a dose, take it as soon as you remember on the same day and resume taking twice daily.  Do not take more than one dose of ELIQUIS at the same time.  Important Safety Information A possible side effect of Eliquis is bleeding. You should call your healthcare provider right away if you experience any of the following: ? Bleeding from an injury or your nose that does not stop. ? Unusual colored urine (red or dark brown) or unusual colored stools (red or black). ? Unusual bruising for unknown reasons. ? A serious fall or if you hit your head (even if there is no bleeding).  Some medicines may interact with Eliquis and might increase your risk of bleeding or clotting while on Eliquis. To help avoid this, consult your healthcare provider or pharmacist prior to using any new prescription or non-prescription medications, including herbals, vitamins, non-steroidal anti-inflammatory drugs (NSAIDs) and supplements.  This website has more information on Eliquis (apixaban): [email or web address].

## 2017-09-25 LAB — BASIC METABOLIC PANEL
Anion gap: 8 (ref 5–15)
BUN: 21 mg/dL (ref 8–23)
CO2: 29 mmol/L (ref 22–32)
Calcium: 8.6 mg/dL — ABNORMAL LOW (ref 8.9–10.3)
Chloride: 100 mmol/L (ref 98–111)
Creatinine, Ser: 1.25 mg/dL — ABNORMAL HIGH (ref 0.44–1.00)
GFR calc Af Amer: 48 mL/min — ABNORMAL LOW (ref >60)
GFR calc non Af Amer: 42 mL/min — ABNORMAL LOW (ref >60)
Glucose, Bld: 75 mg/dL (ref 70–99)
Potassium: 3.8 mmol/L (ref 3.5–5.1)
Sodium: 137 mmol/L (ref 135–145)

## 2017-09-25 LAB — CBC
HCT: 28.1 % — ABNORMAL LOW (ref 36.0–46.0)
Hemoglobin: 8.8 g/dL — ABNORMAL LOW (ref 12.0–15.0)
MCH: 29.3 pg (ref 26.0–34.0)
MCHC: 31.3 g/dL (ref 30.0–36.0)
MCV: 93.7 fL (ref 78.0–100.0)
Platelets: 317 10*3/uL (ref 150–400)
RBC: 3 MIL/uL — ABNORMAL LOW (ref 3.87–5.11)
RDW: 14.6 % (ref 11.5–15.5)
WBC: 8.2 10*3/uL (ref 4.0–10.5)

## 2017-09-25 LAB — GLUCOSE, CAPILLARY
Glucose-Capillary: 142 mg/dL — ABNORMAL HIGH (ref 70–99)
Glucose-Capillary: 84 mg/dL (ref 70–99)

## 2017-09-25 LAB — MAGNESIUM: Magnesium: 1.5 mg/dL — ABNORMAL LOW (ref 1.7–2.4)

## 2017-09-25 MED ORDER — AMIODARONE HCL 200 MG PO TABS: 200.0000 mg | ORAL_TABLET | Freq: Two times a day (BID) | ORAL | 1 refills | Status: DC

## 2017-09-25 MED ORDER — ASPIRIN 325 MG PO TBEC
325.0000 mg | DELAYED_RELEASE_TABLET | Freq: Every day | ORAL | Status: DC
Start: 2017-09-25 — End: 2017-10-20

## 2017-09-25 MED ORDER — ATORVASTATIN CALCIUM 80 MG PO TABS
80.0000 mg | ORAL_TABLET | Freq: Every day | ORAL | 1 refills | Status: DC
Start: 2017-09-25 — End: 2017-11-17

## 2017-09-25 MED ORDER — TRAMADOL HCL 50 MG PO TABS: 50.0000 mg | ORAL_TABLET | Freq: Four times a day (QID) | ORAL | 0 refills | Status: AC | PRN

## 2017-09-25 NOTE — Progress Notes (Signed)
Pt discharged, PICC line removed by IV team, patient education and discharge instructions discussed with pt acknowledging information, identified pharmacy [facility] on [address] open today to fill prescriptions, vital signs stable and all questions answered.

## 2017-09-25 NOTE — Progress Notes (Signed)
IV Team: L arm PICC removed. Pressure dressing applied. Pt instructed to lie flat for :30 post removal, keep dressing clean dry and intact for 24 hours. Teach back complete.

## 2017-09-25 NOTE — Progress Notes (Signed)
      [address]       [place] [address]             [phone]      7 Days Post-Op Procedure(s) (LRB): CORONARY ARTERY BYPASS GRAFTING (CABG) (N/A) TRANSESOPHAGEAL ECHOCARDIOGRAM (TEE) (N/A) Subjective: Feels well, no new issues  Objective: Vital signs in last 24 hours: Temp:  [98 F (36.7 C)-98.4 F (36.9 C)] 98.2 F (36.8 C) (07/04 0753) Pulse Rate:  [81-88] 81 (07/04 0753) Cardiac Rhythm: Normal sinus rhythm (07/04 0700) Resp:  [16-32] 18 (07/04 0753) BP: (82-140)/(55-77) 121/61 (07/04 0753) SpO2:  [94 %-99 %] 94 % (07/04 0753) Weight:  [89 kg (196 lb 3.4 oz)] 89 kg (196 lb 3.4 oz) (07/04 0430)  Hemodynamic parameters for last 24 hours:    Intake/Output from previous day: 07/03 0701 - 07/04 0700 In: 799.3 [P.O.:716; I.V.:83.3] Out: -  Intake/Output this shift: No intake/output data recorded.  General appearance: alert, cooperative and no distress Heart: regular rate and rhythm Lungs: clear to auscultation bilaterally Abdomen: benign Extremities: + min edema Wound: incis healing well  Lab Results: Recent Labs    09/23/17 0421 09/25/17 0500  WBC 7.5 8.2  HGB 9.1* 8.8*  HCT 29.0* 28.1*  PLT 245 317   BMET:  Recent Labs    09/24/17 0356 09/25/17 0500  NA 135 137  K 4.2 3.8  CL 98 100  CO2 28 29  GLUCOSE 175* 75  BUN 21 21  CREATININE 1.32* 1.25*  CALCIUM 8.9 8.6*    PT/INR: No results for input(s): LABPROT, INR in the last 72 hours. ABG    Component Value Date/Time   PHART 7.330 (L) 09/18/2017 2228   HCO3 20.5 09/18/2017 2228   TCO2 21 (L) 09/19/2017 1909   ACIDBASEDEF 5.0 (H) 09/18/2017 2228   O2SAT 97.0 09/18/2017 2228   CBG (last 3)  Recent Labs    09/24/17 1612 09/24/17 2214 09/25/17 0633  GLUCAP 98 104* 142*    Meds Scheduled Meds: . amiodarone  200 mg Oral Q12H   Followed by  . [START ON 10/01/2017] amiodarone  200 mg Oral Daily  . amLODipine  10 mg Oral Daily  . apixaban  5 mg Oral BID  . aspirin  EC  325 mg Oral Daily  . atorvastatin  80 mg Oral q1800  . Chlorhexidine Gluconate Cloth  6 each Topical Daily  . docusate sodium  200 mg Oral Daily  . glipiZIDE  5 mg Oral BID AC  . hydrochlorothiazide  25 mg Oral Daily  . insulin aspart  0-24 Units Subcutaneous TID AC & HS  . metFORMIN  1,000 mg Oral BID WC  . omega-3 acid ethyl esters  1 g Oral TID  . pantoprazole  40 mg Oral QAC breakfast  . sodium chloride flush  3 mL Intravenous Q12H   Continuous Infusions: . sodium chloride     PRN Meds:.sodium chloride, acetaminophen, ALPRAZolam, alum & mag hydroxide-simeth, bisacodyl **OR** bisacodyl, meclizine, ondansetron **OR** ondansetron (ZOFRAN) IV, sodium chloride flush, sodium chloride flush, tiZANidine, traMADol  Xrays No results found.  Assessment/Plan: S/P Procedure(s) (LRB): CORONARY ARTERY BYPASS GRAFTING (CABG) (N/A) TRANSESOPHAGEAL ECHOCARDIOGRAM (TEE) (N/A) Plan for discharge: see discharge orders Remains hemodyn stable in sinus, will restart apixaban at discharge Creat conts to improve Sugars are reasonable on current RX H/H is stable  LOS: 13 days    John Giovanni PA-C  [phone] pager 09/25/2017

## 2017-09-25 NOTE — Care Management Note (Signed)
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 Plan for CABG 09-18-17. PTA from home with the support of husband. Patient states she uses [facility] at home.               Action/Plan: CM will continue to monitor post surgery for disposition needs.   Expected Discharge Date:  09/25/17               Expected Discharge Plan:  [place]  In-House Referral:  NA  Discharge planning Services  CM Consult  Post Acute Care Choice:  NA Choice offered to:  NA  DME Arranged:    DME Agency:     HH Arranged:    HH Agency:     Status of Service:  Completed, signed off  If discussed at [employer] of Stay Meetings, dates discussed:    Discharge Disposition: home/self care   Additional Comments:  09/25/17- 1345- Marvetta Gibbons RN, CM- pt for d/c home today- per bedside RN pt has declined any HH or DME needs- Pt to transition home with family.   Dawayne Patricia, RN 09/25/2017, 11:11 AM

## 2017-10-09 ENCOUNTER — Encounter: Payer: Self-pay | Admitting: Physician Assistant

## 2017-10-09 NOTE — Progress Notes (Signed)
Cardiology Office Note    Date:  10/10/2017  ID:  Karen Tate, DOB [birth year], MRN [id number] PCP:  Neale Burly, MD  Cardiologist:  Kate Sable, MD  Chief Complaint: f/u bypass  History of Present Illness:  Karen Tate is a 72 y.o. female with history of HTN, PE, DM, anxiety, arthritis, GERD, hiatal hernia, glaucoma, hyperlipidemia, joint pain, CKD III by labs, recent CAD/NSTEMI s/p CABG, ischemic cardiomyopathy, carotid artery disease with post-op afib who presents for post-bypass follow-up.   Per Dr. Court Joy notes she has a history of PE dating back at least a year ago for which she's been on Eliquis by PCP. The patient's understanding was this would be indefinite. She was recently sent for a nuclear stress test by PCP and during the [place] nuc, developed SSCP and EKG changes with ST depression. She was sent to [facility] and admitted where she ruled in with troponin 4.33. Eliquis was held over the weekend in prep for cath which showed multivessel disease and LVEF 30%. Intraop TEE showed EF 35-40%. On 09/18/17 she went to the OR and underwent coronary artery bypass grafting x3 (left internal mammary artery to left anterior descending, sequential saphenous vein graft to ramus intermedius branches 1 and 2). Post-op course notable for volume overload requiring diuretics and post-op atrial fib with conversion to NSR with amiodarone. Given prior history of bradycardia on beta blocker, this was not started. Plavix was not started given need for ASA and Eliquis. Lats labs showed Hgb 8.8, K 3.8, Cr 1.25, LDL 75, TSH wnl, A1c 7.4. Baseline Hgb was actually 10.4 so anemia appeared chronic. Pre-cabg carotid duplex 1-39% stenosis in RICA, 19-41% LICA.    She presents back for follow-up with daughter and husband and feels she's actually doing very well. No chest pain, SOB, orthopnea, PND. + Mild pedal edema of RLE (vein harvest leg), not red or tender. Sleeps chronically in a "lift  chair", unchanged for many years. BP running higher today, confirmed by me. She was on an extensive regimen prior to recent admission which included hydralazine and benazepril which were not resumed at DC. Of note she apparently had a history of her her HR dropping into the 40s during a cardiac test at [facility] in 2014 while on combination of BB + CCB. For this reason, beta blocker was not started in the hospital but recommended to consider as outpatient.   Past Medical History:  Diagnosis Date  . Anxiety neurosis   . Arthritis   . CAD (coronary artery disease)    a. 08/2017: abnormal nuc-> sent directly to [facility], NSTEMI, s/p CABG (left internal mammary artery to left anterior descending, sequential saphenous vein graft to ramus intermedius branches 1 and 2).  . Carotid artery disease ([place])   . Chronic anemia   . Dizziness   . GERD (gastroesophageal reflux disease)   . Glaucoma   . Hiatal hernia   . Hyperlipemia   . Hypertension   . Ischemic cardiomyopathy   . Joint pain   . Postoperative atrial fibrillation ([place]) 08/2017  . Pulmonary embolus ([place])   . Type 2 diabetes mellitus ([place])     Past Surgical History:  Procedure Laterality Date  . CORONARY ARTERY BYPASS GRAFT N/A 09/18/2017   Procedure: CORONARY ARTERY BYPASS GRAFTING (CABG);  Surgeon: [name], MD;  Location: [facility];  Service: Open Heart Surgery;  Laterality: N/A;  Saphenous vein harvest. LIMA to LAD Saphenous vein (sequential) ramus 1 & 2  .  HERNIA REPAIR    . LEFT HEART CATH AND CORONARY ANGIOGRAPHY N/A 09/15/2017   Procedure: LEFT HEART CATH AND CORONARY ANGIOGRAPHY;  Surgeon: Lorretta Harp, MD;  Location: [facility];  Service: Cardiovascular;  Laterality: N/A;  . SHOULDER SURGERY     LEFT  . TEE WITHOUT CARDIOVERSION N/A 09/18/2017   Procedure: TRANSESOPHAGEAL ECHOCARDIOGRAM (TEE);  Surgeon: [name], MD;  Location: [facility];  Service: Open Heart Surgery;  Laterality: N/A;  . TOTAL KNEE  ARTHROPLASTY     LEFT  . VAGINAL HYSTERECTOMY      Current Medications: Current Meds  Medication Sig  . ALPRAZolam (XANAX) 0.5 MG tablet Take 0.5 mg by mouth 2 (two) times daily.  Marland Kitchen amiodarone (PACERONE) 200 MG tablet Take 1 tablet (200 mg total) by mouth every 12 (twelve) hours. For 7 days, then once daily  . amLODipine (NORVASC) 10 MG tablet Take 10 mg by mouth daily.  Marland Kitchen apixaban (ELIQUIS) 5 MG TABS tablet Take 5 mg by mouth 2 (two) times daily.  Marland Kitchen aspirin EC 325 MG EC tablet Take 1 tablet (325 mg total) by mouth daily.  Marland Kitchen atorvastatin (LIPITOR) 80 MG tablet Take 1 tablet (80 mg total) by mouth daily at 6 PM.  . fish oil-omega-3 fatty acids 1000 MG capsule Take 1 g by mouth 3 (three) times daily.   Marland Kitchen glipiZIDE (GLUCOTROL) 5 MG tablet Take 5 mg by mouth 2 (two) times daily before a meal.  . hydrochlorothiazide (HYDRODIURIL) 25 MG tablet Take 25 mg by mouth daily.  . meclizine (ANTIVERT) 25 MG tablet Take 25 mg by mouth 3 (three) times daily as needed for dizziness.  . metFORMIN (GLUCOPHAGE) 1000 MG tablet Take 1,000 mg by mouth 2 (two) times daily with a meal.  . omeprazole (PRILOSEC) 40 MG capsule Take 40 mg by mouth daily.  Marland Kitchen tiZANidine (ZANAFLEX) 4 MG tablet Take 1 tablet (4 mg total) by mouth every 6 (six) hours as needed for muscle spasms.   Allergies:   Beta adrenergic blockers; Calcium channel blockers; and Naproxen   Social History   Socioeconomic History  . Marital status: Married    Spouse name: Not on file  . Number of children: Not on file  . Years of education: Not on file  . Highest education level: Not on file  Occupational History  . Not on file  Social Needs  . Financial resource strain: Not on file  . Food insecurity:    Worry: Not on file    Inability: Not on file  . Transportation needs:    Medical: Not on file    Non-medical: Not on file  Tobacco Use  . Smoking status: Former Smoker    Packs/day: 1.25    Years: 16.00    Pack years: 20.00    Types:  Cigarettes    Last attempt to quit: 03/25/1978    Years since quitting: 39.5  . Smokeless tobacco: Never Used  . Tobacco comment: quit more than 30 years ago  Substance and Sexual Activity  . Alcohol use: No  . Drug use: No  . Sexual activity: Not on file  Lifestyle  . Physical activity:    Days per week: Not on file    Minutes per session: Not on file  . Stress: Not on file  Relationships  . Social connections:    Talks on phone: Not on file    Gets together: Not on file    Attends religious service: Not on  file    Active member of club or organization: Not on file    Attends meetings of clubs or organizations: Not on file    Relationship status: Not on file  Other Topics Concern  . Not on file  Social History Narrative  . Not on file     Family History:  The patient's family history includes Breast cancer in her mother; Diabetes in her father.  ROS:   Please see the history of present illness. Otherwise, review of systems is positive for chronic muscle cramps in her legs. All other systems are reviewed and otherwise negative.    PHYSICAL EXAM:   VS:  BP (!) 154/66   Pulse 88   Ht 5\' 5"  (1.651 m)   Wt 195 lb (88.5 kg)   SpO2 99% Comment: on room air  BMI 32.45 kg/m   BMI: Body mass index is 32.45 kg/m. GEN: Well nourished, well developed AAF in no acute distress HEENT: normocephalic, atraumatic Neck: no JVD, carotid bruits, or masses Cardiac: RRR occasional ectopy by exam, no murmurs, rubs, or gallops, trace RLE edema  Respiratory:  clear to auscultation bilaterally, normal work of breathing GI: soft, nontender, nondistended, + BS MS: no deformity or atrophy Skin: warm and dry, no rash. Surgical sites appear to be healing well without acute complication Neuro:  Alert and Oriented x 3, Strength and sensation are intact, follows commands Psych: euthymic mood, full affect  Wt Readings from Last 3 Encounters:  10/10/17 195 lb (88.5 kg)  09/25/17 196 lb 3.4 oz (89  kg)  09/05/17 203 lb (92.1 kg)      Studies/Labs Reviewed:   EKG:  EKG was ordered today and personally reviewed by me and demonstrates NSR 83bpm, diffuse TW changes, appear evolution from prior, QTc 463ms  Recent Labs: 09/12/2017: TSH 1.064 09/13/2017: ALT 23 09/25/2017: BUN 21; Creatinine, Ser 1.25; Hemoglobin 8.8; Magnesium 1.5; Platelets 317; Potassium 3.8; Sodium 137   Lipid Panel    Component Value Date/Time   CHOL 152 09/13/2017 0423   TRIG 209 (H) 09/13/2017 0423   HDL 35 (L) 09/13/2017 0423   CHOLHDL 4.3 09/13/2017 0423   VLDL 42 (H) 09/13/2017 0423   LDLCALC 75 09/13/2017 0423    Additional studies/ records that were reviewed today include: Summarized above.   ASSESSMENT & PLAN:   1. CAD s/p CABG, NSTEMI - clinically doing well. I reviewed recent hospital records and feel that it would be beneficial for [place] to try a trial of beta blocker given her recent MI, LV dysfunction, and PAF with occasional ectopy by exam. Will try carvedilol 3.125mg  BID as it may have less effect on HR than traditional choices. I also believe she will need additional blood pressure control and will re-initiate ACEI at lower dose of benazepril 10mg  daily. The patient was instructed to monitor their blood pressure at home and to call if tending to run higher than 517 systolic. She was also advised to monitor HR at home as well and to call with any concerns. Will check BMET today and repeat in a week. Ultimately it could be considered to transition amlodipine to spironolactone or hydralazine given her low EF, but will start with stepwise progression. 2. Ischemic cardiomyopathy - aside from trace edema at RLE, appears relatively euvolemic. Adding BB/ACEI as above. Reviewed 2g sodium restriction, 2L fluid restriction, daily weights with patient. 3. Post-op atrial fibrillation - maintaining NSR but occasional ectopy on exam. Add BB as above. Continue amiodarone for now in  the immediate 2-3 month post-op  period. She has f/u with Dr. Bronson Ing 8/1 and will keep this appointment to review timing of discontinuation. She was also asked to review ultimate duration of Eliquis for her PE with her PCP as this will impact whether we stop Eliquis for her post-op afib if no clinical recurrences. 4. Essential HTN - follow with addition of ACEI and BB. 5. Hyperlipidemia - if muscle cramps do not improve, may need to think about adjusting statin. For this reason I will not yet arrange liver/lipids, but this can be addressed in follow-up. 6. Carotid artery disease - further monitoring per primary cardiologist. 7. Muscle cramps - lower extremity arterial testing was only mildly abnormal. She asked about using a cream called TheraWorx which looks like it has grapefruit extract. I'm not sure if this would be systemically absorbed so I would advise against using this for now. Will check lytes.  Disposition: F/u with CVTS as scheduled later this month, and Dr. Bronson Ing 10/23/17.  Medication Adjustments/Labs and Tests Ordered: Current medicines are reviewed at length with the patient today.  Concerns regarding medicines are outlined above. Medication changes, Labs and Tests ordered today are summarized above and listed in the Patient Instructions accessible in Encounters.   Signed, Charlie Pitter, PA-C  10/10/2017 1:45 PM    [facility] in [place]. [place], [place] [address] Ph: [phone]; Fax [phone]

## 2017-10-10 ENCOUNTER — Ambulatory Visit (INDEPENDENT_AMBULATORY_CARE_PROVIDER_SITE_OTHER): Payer: Medicare Other | Admitting: Physician Assistant

## 2017-10-10 ENCOUNTER — Telehealth: Payer: Self-pay | Admitting: *Deleted

## 2017-10-10 ENCOUNTER — Other Ambulatory Visit: Payer: Self-pay | Admitting: *Deleted

## 2017-10-10 ENCOUNTER — Other Ambulatory Visit (HOSPITAL_COMMUNITY)
Admission: RE | Admit: 2017-10-10 | Discharge: 2017-10-10 | Disposition: A | Discharge status: Home / Self Care | Payer: Medicare Other | Source: Ambulatory Visit | Attending: Physician Assistant | Admitting: Physician Assistant

## 2017-10-10 ENCOUNTER — Encounter: Payer: Self-pay | Admitting: Physician Assistant

## 2017-10-10 VITALS — BP 154/66 | HR 88 | Ht 65.0 in | Wt 195.0 lb

## 2017-10-10 DIAGNOSIS — E785 Hyperlipidemia, unspecified: Secondary | ICD-10-CM | POA: Insufficient documentation

## 2017-10-10 DIAGNOSIS — I9789 Other postprocedural complications and disorders of the circulatory system, not elsewhere classified: Secondary | ICD-10-CM

## 2017-10-10 DIAGNOSIS — I10 Essential (primary) hypertension: Secondary | ICD-10-CM | POA: Diagnosis not present

## 2017-10-10 DIAGNOSIS — I4891 Unspecified atrial fibrillation: Secondary | ICD-10-CM

## 2017-10-10 DIAGNOSIS — I2510 Atherosclerotic heart disease of native coronary artery without angina pectoris: Secondary | ICD-10-CM | POA: Diagnosis not present

## 2017-10-10 DIAGNOSIS — Z951 Presence of aortocoronary bypass graft: Secondary | ICD-10-CM

## 2017-10-10 DIAGNOSIS — I255 Ischemic cardiomyopathy: Secondary | ICD-10-CM | POA: Diagnosis not present

## 2017-10-10 DIAGNOSIS — R252 Cramp and spasm: Secondary | ICD-10-CM | POA: Diagnosis not present

## 2017-10-10 DIAGNOSIS — Z79899 Other long term (current) drug therapy: Secondary | ICD-10-CM

## 2017-10-10 LAB — CBC WITH DIFFERENTIAL/PLATELET
Basophils Absolute: 0 10*3/uL (ref 0.0–0.1)
Basophils Relative: 1 %
Eosinophils Absolute: 0.2 10*3/uL (ref 0.0–0.7)
Eosinophils Relative: 3 %
HCT: 33.4 % — ABNORMAL LOW (ref 36.0–46.0)
Hemoglobin: 10.3 g/dL — ABNORMAL LOW (ref 12.0–15.0)
Lymphocytes Relative: 26 %
Lymphs Abs: 2 10*3/uL (ref 0.7–4.0)
MCH: 28.7 pg (ref 26.0–34.0)
MCHC: 30.8 g/dL (ref 30.0–36.0)
MCV: 93 fL (ref 78.0–100.0)
Monocytes Absolute: 0.6 10*3/uL (ref 0.1–1.0)
Monocytes Relative: 7 %
Neutro Abs: 4.8 10*3/uL (ref 1.7–7.7)
Neutrophils Relative %: 63 %
Platelets: 484 10*3/uL — ABNORMAL HIGH (ref 150–400)
RBC: 3.59 MIL/uL — ABNORMAL LOW (ref 3.87–5.11)
RDW: 14.5 % (ref 11.5–15.5)
WBC: 7.6 10*3/uL (ref 4.0–10.5)

## 2017-10-10 LAB — BASIC METABOLIC PANEL
Anion gap: 12 (ref 5–15)
BUN: 17 mg/dL (ref 8–23)
CO2: 26 mmol/L (ref 22–32)
Calcium: 9.6 mg/dL (ref 8.9–10.3)
Chloride: 102 mmol/L (ref 98–111)
Creatinine, Ser: 1.22 mg/dL — ABNORMAL HIGH (ref 0.44–1.00)
GFR calc Af Amer: 50 mL/min — ABNORMAL LOW (ref >60)
GFR calc non Af Amer: 43 mL/min — ABNORMAL LOW (ref >60)
Glucose, Bld: 72 mg/dL (ref 70–99)
Potassium: 3.5 mmol/L (ref 3.5–5.1)
Sodium: 140 mmol/L (ref 135–145)

## 2017-10-10 LAB — MAGNESIUM: Magnesium: 1.3 mg/dL — ABNORMAL LOW (ref 1.7–2.4)

## 2017-10-10 MED ORDER — CARVEDILOL 3.125 MG PO TABS: 3.1250 mg | ORAL_TABLET | Freq: Two times a day (BID) | ORAL | 3 refills | Status: DC

## 2017-10-10 MED ORDER — BENAZEPRIL HCL 10 MG PO TABS: 10.0000 mg | ORAL_TABLET | Freq: Every day | ORAL | 1 refills | Status: DC

## 2017-10-10 MED ORDER — MAGNESIUM OXIDE 400 MG PO TABS: 400.0000 mg | ORAL_TABLET | Freq: Two times a day (BID) | ORAL | 3 refills | Status: DC

## 2017-10-10 MED ORDER — CARVEDILOL 3.125 MG PO TABS: 3.1250 mg | ORAL_TABLET | Freq: Two times a day (BID) | ORAL | 1 refills | Status: DC

## 2017-10-10 MED ORDER — BENAZEPRIL HCL 10 MG PO TABS: 10.0000 mg | ORAL_TABLET | Freq: Every day | ORAL | 3 refills | Status: DC

## 2017-10-10 MED ORDER — MAGNESIUM OXIDE 400 MG PO TABS: 400.0000 mg | ORAL_TABLET | Freq: Every day | ORAL | 3 refills | Status: DC

## 2017-10-10 NOTE — Addendum Note (Signed)
Addended by: Levonne Hubert on: 10/10/2017 04:32 PM   Modules accepted: Orders

## 2017-10-10 NOTE — Telephone Encounter (Signed)
-----   Message from Charlie Pitter, [place] sent at 10/10/2017  4:36 PM EDT ----- Please call patient -magnesium is extremely low, which may be causing her leg cramps. I would suggest we add Mag Ox 400mg  BID. Please increase dietary intake of healthy sources of potassium/magnesnium including bananas, squash, yogurt, white beans, sweet potatoes, leafy greens, and avocados. Repeat magnesium along with BMET in 1 week. (If these are still low, I will probably recommend changing HCTZ to spironolactone but would really prefer to make only a few changes at a time). Dayna Dunn PA-C

## 2017-10-10 NOTE — Patient Instructions (Addendum)
For patients with weak heart muscles, we give them these special instructions:  1. Follow a low-salt diet - you are allowed no more than 2,000mg  of sodium per day. Watch your fluid intake. In general, you should not be taking in more than 2 liters of fluid per day (no more than 8 glasses per day). This includes sources of water in foods like soup, coffee, tea, milk, etc. 2. Weigh yourself on the same scale at same time of day and keep a log. 3. Call your doctor: (Anytime you feel any of the following symptoms)  - 3lb weight gain overnight or 5lb within a few days - Shortness of breath, with or without a dry hacking cough  - Swelling in the hands, feet or stomach  - If you have to sleep on extra pillows at night in order to breathe   IT IS IMPORTANT TO LET YOUR DOCTOR KNOW EARLY ON IF YOU ARE HAVING SYMPTOMS SO WE CAN HELP YOU!   Please monitor your blood pressure occasionally at home. Call your doctor if you tend to get readings of greater than 130 on the top number or 80 on the bottom number.   Please see your primary care doctor to clarify how long they wanted you to be on the Eliquis. Do not stop it just yet, but bring this information to your next cardiology office visit. Medication Instructions:  Your physician has recommended you make the following change in your medication:  Start Carvedilol 3.125 mg Two Times Daily  Start Benazepril 10 mg Daily    Labwork: Your physician recommends that you return for lab work in: Today  Your physician recommends that you return for lab work in: 10/17/17   Testing/Procedures: NONE   Follow-Up: Your physician recommends that you schedule a follow-up appointment in: As Scheduled    Any Other Special Instructions Will Be Listed Below (If Applicable).     If you need a refill on your cardiac medications before your next appointment, please call your pharmacy.

## 2017-10-13 DIAGNOSIS — F411 Generalized anxiety disorder: Secondary | ICD-10-CM | POA: Diagnosis not present

## 2017-10-13 DIAGNOSIS — E1149 Type 2 diabetes mellitus with other diabetic neurological complication: Secondary | ICD-10-CM | POA: Diagnosis not present

## 2017-10-13 DIAGNOSIS — I25728 Atherosclerosis of autologous artery coronary artery bypass graft(s) with other forms of angina pectoris: Secondary | ICD-10-CM | POA: Diagnosis not present

## 2017-10-17 ENCOUNTER — Other Ambulatory Visit (HOSPITAL_COMMUNITY)
Admission: RE | Admit: 2017-10-17 | Discharge: 2017-10-17 | Disposition: A | Discharge status: Home / Self Care | Payer: Medicare Other | Source: Ambulatory Visit | Attending: Physician Assistant | Admitting: Physician Assistant

## 2017-10-17 ENCOUNTER — Other Ambulatory Visit: Payer: Self-pay | Admitting: Thoracic Surgery (Cardiothoracic Vascular Surgery)

## 2017-10-17 DIAGNOSIS — Z951 Presence of aortocoronary bypass graft: Secondary | ICD-10-CM

## 2017-10-17 DIAGNOSIS — Z79899 Other long term (current) drug therapy: Secondary | ICD-10-CM | POA: Diagnosis not present

## 2017-10-17 DIAGNOSIS — I10 Essential (primary) hypertension: Secondary | ICD-10-CM | POA: Diagnosis not present

## 2017-10-17 DIAGNOSIS — I25728 Atherosclerosis of autologous artery coronary artery bypass graft(s) with other forms of angina pectoris: Secondary | ICD-10-CM | POA: Diagnosis not present

## 2017-10-17 DIAGNOSIS — F411 Generalized anxiety disorder: Secondary | ICD-10-CM | POA: Diagnosis not present

## 2017-10-17 DIAGNOSIS — E1149 Type 2 diabetes mellitus with other diabetic neurological complication: Secondary | ICD-10-CM | POA: Diagnosis not present

## 2017-10-17 LAB — BASIC METABOLIC PANEL
Anion gap: 9 (ref 5–15)
BUN: 17 mg/dL (ref 8–23)
CO2: 29 mmol/L (ref 22–32)
Calcium: 9.4 mg/dL (ref 8.9–10.3)
Chloride: 101 mmol/L (ref 98–111)
Creatinine, Ser: 1.28 mg/dL — ABNORMAL HIGH (ref 0.44–1.00)
GFR calc Af Amer: 47 mL/min — ABNORMAL LOW (ref >60)
GFR calc non Af Amer: 40 mL/min — ABNORMAL LOW (ref >60)
Glucose, Bld: 101 mg/dL — ABNORMAL HIGH (ref 70–99)
Potassium: 4.4 mmol/L (ref 3.5–5.1)
Sodium: 139 mmol/L (ref 135–145)

## 2017-10-20 ENCOUNTER — Telehealth: Payer: Self-pay | Admitting: Physician Assistant

## 2017-10-20 ENCOUNTER — Ambulatory Visit (INDEPENDENT_AMBULATORY_CARE_PROVIDER_SITE_OTHER): Payer: Self-pay | Admitting: Physician Assistant

## 2017-10-20 ENCOUNTER — Ambulatory Visit
Admission: RE | Admit: 2017-10-20 | Discharge: 2017-10-20 | Disposition: A | Discharge status: Home / Self Care | Payer: Medicare Other | Source: Ambulatory Visit | Attending: Thoracic Surgery (Cardiothoracic Vascular Surgery) | Admitting: Thoracic Surgery (Cardiothoracic Vascular Surgery)

## 2017-10-20 VITALS — BP 150/72 | HR 78 | Resp 20 | Ht 65.0 in | Wt 197.0 lb

## 2017-10-20 DIAGNOSIS — Z951 Presence of aortocoronary bypass graft: Secondary | ICD-10-CM

## 2017-10-20 DIAGNOSIS — I2510 Atherosclerotic heart disease of native coronary artery without angina pectoris: Secondary | ICD-10-CM

## 2017-10-20 DIAGNOSIS — J90 Pleural effusion, not elsewhere classified: Secondary | ICD-10-CM | POA: Diagnosis not present

## 2017-10-20 MED ORDER — ASPIRIN EC 81 MG PO TBEC: 81.0000 mg | DELAYED_RELEASE_TABLET | Freq: Every day | ORAL | Status: DC

## 2017-10-20 NOTE — Progress Notes (Signed)
HPI:  Patient returns for routine postoperative follow-up having had a NSTEMI and undergoing CABG x 3 by Dr. Roxan Hockey on 09/18/2017. The patient's early postoperative recovery while in the hospital was notable for atrial fibrillation. She was put on Amiodarone and converted to sinus rhythm.  Patient denies shortness of breath, chest pain, or palpitations. She states her sugars have been fairly well controlled;however, when her sugar is below 100, she does not feel well.    Current Outpatient Medications  Medication Sig Dispense Refill  . ALPRAZolam (XANAX) 0.5 MG tablet Take 0.5 mg by mouth 2 (two) times daily.    Marland Kitchen amiodarone (PACERONE) 200 MG tablet Take 1 tablet (200 mg total) by mouth every 12 (twelve) hours. For 7 days, then once daily 37 tablet 1  . amLODipine (NORVASC) 10 MG tablet Take 10 mg by mouth daily.    Marland Kitchen apixaban (ELIQUIS) 5 MG TABS tablet Take 5 mg by mouth 2 (two) times daily.    Marland Kitchen aspirin EC 325 MG EC tablet Take 1 tablet (325 mg total) by mouth daily.    Marland Kitchen atorvastatin (LIPITOR) 80 MG tablet Take 1 tablet (80 mg total) by mouth daily at 6 PM. 30 tablet 1  . benazepril (LOTENSIN) 10 MG tablet Take 1 tablet (10 mg total) by mouth daily. 60 tablet 1  . carvedilol (COREG) 3.125 MG tablet Take 1 tablet (3.125 mg total) by mouth 2 (two) times daily. 60 tablet 1  . fish oil-omega-3 fatty acids 1000 MG capsule Take 1 g by mouth 3 (three) times daily.     Marland Kitchen glipiZIDE (GLUCOTROL) 5 MG tablet Take 5 mg by mouth 2 (two) times daily before a meal.    . hydrochlorothiazide (HYDRODIURIL) 25 MG tablet Take 25 mg by mouth daily.    . magnesium oxide (MAG-OX) 400 MG tablet Take 1 tablet (400 mg total) by mouth 2 (two) times daily. 180 tablet 3  . meclizine (ANTIVERT) 25 MG tablet Take 25 mg by mouth 3 (three) times daily as needed for dizziness.    . metFORMIN (GLUCOPHAGE) 1000 MG tablet Take 1,000 mg by mouth 2 (two) times daily with a meal.    . omeprazole (PRILOSEC) 40 MG capsule  Take 40 mg by mouth daily.    Marland Kitchen tiZANidine (ZANAFLEX) 4 MG tablet Take 1 tablet (4 mg total) by mouth every 6 (six) hours as needed for muscle spasms. 60 tablet 0  Vital Signs: BP 150/72, HR 78,, RR 20, Oxygenation 97% on room air.   Physical Exam: CV-RRR Pulmonary-Clear to auscultation bilaterally Extremities-Trace RLE edema Wounds-Sternal wound is well healed. RLE wound has eschar,no sign of infection.  Diagnostic Tests: CLINICAL DATA:  Status post CABG on September 18, 2017. Patient ports mile weakness today.  EXAM: CHEST - 2 VIEW  COMPARISON:  Chest x-ray of September 23, 2017  FINDINGS: The lungs are adequately inflated. There is no focal infiltrate. The previously demonstrated small pleural effusions have resolved. There is persistent linear increased density in the periphery of the left mid to lower lung which is less conspicuous today. The heart and pulmonary vascularity are normal. The sternal wires are intact. There is calcification in the wall of the aortic arch. The bony thorax exhibits no acute abnormality.  IMPRESSION: There is no pneumonia nor pulmonary edema. Interval resolution of bilateral pleural effusions. Persistent linear scarring versus subsegmental atelectasis peripherally in the left mid to lower lung.   Electronically Signed   By: David  Martinique M.D.   On: 10/20/2017  13:40  Impression and Plan: Overall, she is recovering well from coronary artery bypass grafting surgery. She has already seen the physician assistant from cardiology on 10/10/2017. Changes to her medication included starting Coreg 3.125 mg bid and Benazepril 10 mg daily. She has been monitoring her blood pressure at home. At times, her systolic blood is 286 and is obviously elevated at this office visit. She will likely need to have Benazepril increased but since she has an appointment to see Dr. Bronson Ing in 2 days, I will defer this to him.She continues to maintain sinus rhythm so hopefully we  will be able to stop Amiodarone in the next 1-2 months. Regarding Eliquis, she was initially on this because of PE so will not stop at this time. I will decrease ecasa to 81 mg, however. She is not taking narcotics for pain so she was instructed she may begin driving short distances (30 minutes or less during the day) and increase the frequency and duration as tolerates after this week. She was instructed to continue with sternal precautions (I.e. No lifting more than 10 pounds for the next 3-4 weeks). She was encouraged to participate in cardiac rehab. Regarding her diabetes, she has seen her medical doctor and he has not made any changes to her current medications. Although she is not hypoglycemic based on her levels, she does not feel well. She states she is eating less than previously as she is trying to eat healthier. Perhaps one of her medications (she is on Metformin and Glipizide) can be reduced, she could document her glucose levels, and check an HGA1C. I told her to discuss this with her medical doctor. She will return to see Dr. Roxan Hockey in about 4 weeks.     Nani Skillern, PA-C [facility] [phone]

## 2017-10-20 NOTE — Telephone Encounter (Signed)
I left a message at [phone] for patient to stop taking enteric coated aspirin 325 mg daily. She is to take enteric coated aspirin 81 mg daily as is on Apixaban 5 mg bid. I left the office number if she should have any questions.

## 2017-10-20 NOTE — Patient Instructions (Signed)
You are encouraged to enroll and participate in the outpatient cardiac rehab program beginning as soon as practical.  Make every effort to keep your diabetes under very tight control.  Follow up closely with your primary care physician or endocrinologist and strive to keep their hemoglobin A1c levels as low as possible, preferably near or below 6.0.  The long term benefits of strict control of diabetes are far reaching and critically important for your overall health and survival.  Continue to avoid any heavy lifting or strenuous use of your arms or shoulders for at least a total of three months from the time of surgery.  After three months you may gradually increase how much you lift or otherwise use your arms or chest as tolerated, with limits based upon whether or not activities lead to the return of significant discomfort.  You may return to driving an automobile as long as you are no longer requiring oral narcotic pain relievers during the daytime.  It would be wise to start driving only short distances during the daylight and gradually increase from there as you feel comfortable.

## 2017-10-23 ENCOUNTER — Ambulatory Visit (INDEPENDENT_AMBULATORY_CARE_PROVIDER_SITE_OTHER): Payer: Medicare Other | Admitting: Cardiovascular Disease

## 2017-10-23 ENCOUNTER — Encounter: Payer: Self-pay | Admitting: Cardiovascular Disease

## 2017-10-23 VITALS — BP 138/62 | HR 69 | Ht 65.0 in | Wt 193.8 lb

## 2017-10-23 DIAGNOSIS — I9789 Other postprocedural complications and disorders of the circulatory system, not elsewhere classified: Secondary | ICD-10-CM | POA: Diagnosis not present

## 2017-10-23 DIAGNOSIS — I25708 Atherosclerosis of coronary artery bypass graft(s), unspecified, with other forms of angina pectoris: Secondary | ICD-10-CM

## 2017-10-23 DIAGNOSIS — I255 Ischemic cardiomyopathy: Secondary | ICD-10-CM

## 2017-10-23 DIAGNOSIS — I4891 Unspecified atrial fibrillation: Secondary | ICD-10-CM

## 2017-10-23 DIAGNOSIS — I10 Essential (primary) hypertension: Secondary | ICD-10-CM

## 2017-10-23 DIAGNOSIS — Z86711 Personal history of pulmonary embolism: Secondary | ICD-10-CM | POA: Diagnosis not present

## 2017-10-23 DIAGNOSIS — Z951 Presence of aortocoronary bypass graft: Secondary | ICD-10-CM

## 2017-10-23 DIAGNOSIS — I6523 Occlusion and stenosis of bilateral carotid arteries: Secondary | ICD-10-CM | POA: Diagnosis not present

## 2017-10-23 DIAGNOSIS — E785 Hyperlipidemia, unspecified: Secondary | ICD-10-CM | POA: Diagnosis not present

## 2017-10-23 MED ORDER — BENAZEPRIL HCL 20 MG PO TABS: 20.0000 mg | ORAL_TABLET | Freq: Every day | ORAL | 3 refills | Status: DC

## 2017-10-23 MED ORDER — AMIODARONE HCL 200 MG PO TABS: 200.0000 mg | ORAL_TABLET | Freq: Two times a day (BID) | ORAL | 1 refills | Status: DC

## 2017-10-23 NOTE — Patient Instructions (Signed)
Your physician recommends that you schedule a follow-up appointment in: 3 months with Dr.Koneswaran      INCREASE Benazepril to 20 mg daily    STOP Amiodarone on Oct 1 st     You have been referred to cardiac Rehab at [facility], they will call with apt.      No labs or tests today     Thank you for choosing [facility] !

## 2017-10-23 NOTE — Progress Notes (Signed)
SUBJECTIVE: The patient presents for routine follow-up.  She underwent three-vessel CABG on 09/18/2017 (left internal mammary artery to left anterior descending, sequential saphenous vein graft to ramus intermedius branches 1 and 2).  She has a history of ischemic cardiomyopathy and DVT/PE, as well as postoperative atrial fibrillation.  She is doing well today and denies chest pain, palpitations, shortness of breath, dizziness, bleeding problems, and leg swelling.  She is here with her husband.  She has been monitoring her blood pressures at home and there are several systolic readings in the 678-938 range.  I reviewed her blood pressure log.  Blood pressure was also 150/72 at the CT surgery office on 7/29.  She has had some lower extremity muscle cramps.     Review of Systems: As per "subjective", otherwise negative.  Allergies  Allergen Reactions  . Beta Adrenergic Blockers Other (See Comments)    Dropped heart rate to the 40's  . Calcium Channel Blockers Other (See Comments)    Dropped HR into the 40's  . Naproxen Hives    Current Outpatient Medications  Medication Sig Dispense Refill  . ALPRAZolam (XANAX) 0.5 MG tablet Take 0.5 mg by mouth 2 (two) times daily.    Marland Kitchen amiodarone (PACERONE) 200 MG tablet Take 1 tablet (200 mg total) by mouth every 12 (twelve) hours. For 7 days, then once daily 37 tablet 1  . amLODipine (NORVASC) 10 MG tablet Take 10 mg by mouth daily.    Marland Kitchen apixaban (ELIQUIS) 5 MG TABS tablet Take 5 mg by mouth 2 (two) times daily.    Marland Kitchen aspirin 81 MG tablet Take 1 tablet (81 mg total) by mouth daily.    Marland Kitchen atorvastatin (LIPITOR) 80 MG tablet Take 1 tablet (80 mg total) by mouth daily at 6 PM. 30 tablet 1  . benazepril (LOTENSIN) 10 MG tablet Take 1 tablet (10 mg total) by mouth daily. 60 tablet 1  . carvedilol (COREG) 3.125 MG tablet Take 1 tablet (3.125 mg total) by mouth 2 (two) times daily. 60 tablet 1  . fish oil-omega-3 fatty acids 1000 MG capsule Take 1  g by mouth 3 (three) times daily.     Marland Kitchen glipiZIDE (GLUCOTROL) 5 MG tablet Take 5 mg by mouth 2 (two) times daily before a meal.    . hydrochlorothiazide (HYDRODIURIL) 25 MG tablet Take 25 mg by mouth daily.    . magnesium oxide (MAG-OX) 400 MG tablet Take 1 tablet (400 mg total) by mouth 2 (two) times daily. 180 tablet 3  . meclizine (ANTIVERT) 25 MG tablet Take 25 mg by mouth 3 (three) times daily as needed for dizziness.    . metFORMIN (GLUCOPHAGE) 1000 MG tablet Take 1,000 mg by mouth 2 (two) times daily with a meal.    . omeprazole (PRILOSEC) 40 MG capsule Take 40 mg by mouth daily.    Marland Kitchen tiZANidine (ZANAFLEX) 4 MG tablet Take 1 tablet (4 mg total) by mouth every 6 (six) hours as needed for muscle spasms. 60 tablet 0   No current facility-administered medications for this visit.     Past Medical History:  Diagnosis Date  . Anxiety neurosis   . Arthritis   . CAD (coronary artery disease)    a. 08/2017: abnormal nuc-> sent directly to [facility], NSTEMI, s/p CABG (left internal mammary artery to left anterior descending, sequential saphenous vein graft to ramus intermedius branches 1 and 2).  . Carotid artery disease ([place])   . Chronic anemia   .  Dizziness   . GERD (gastroesophageal reflux disease)   . Glaucoma   . Hiatal hernia   . Hyperlipemia   . Hypertension   . Ischemic cardiomyopathy   . Joint pain   . Postoperative atrial fibrillation ([place]) 08/2017  . Pulmonary embolus ([place])   . Type 2 diabetes mellitus ([place])     Past Surgical History:  Procedure Laterality Date  . CORONARY ARTERY BYPASS GRAFT N/A 09/18/2017   Procedure: CORONARY ARTERY BYPASS GRAFTING (CABG);  Surgeon: [name], MD;  Location: [facility];  Service: Open Heart Surgery;  Laterality: N/A;  Saphenous vein harvest. LIMA to LAD Saphenous vein (sequential) ramus 1 & 2  . HERNIA REPAIR    . LEFT HEART CATH AND CORONARY ANGIOGRAPHY N/A 09/15/2017   Procedure: LEFT HEART CATH AND CORONARY ANGIOGRAPHY;  Surgeon:  Lorretta Harp, MD;  Location: [facility];  Service: Cardiovascular;  Laterality: N/A;  . SHOULDER SURGERY     LEFT  . TEE WITHOUT CARDIOVERSION N/A 09/18/2017   Procedure: TRANSESOPHAGEAL ECHOCARDIOGRAM (TEE);  Surgeon: [name], MD;  Location: [facility];  Service: Open Heart Surgery;  Laterality: N/A;  . TOTAL KNEE ARTHROPLASTY     LEFT  . VAGINAL HYSTERECTOMY      Social History   Socioeconomic History  . Marital status: Married    Spouse name: Not on file  . Number of children: Not on file  . Years of education: Not on file  . Highest education level: Not on file  Occupational History  . Not on file  Social Needs  . Financial resource strain: Not on file  . Food insecurity:    Worry: Not on file    Inability: Not on file  . Transportation needs:    Medical: Not on file    Non-medical: Not on file  Tobacco Use  . Smoking status: Former Smoker    Packs/day: 1.25    Years: 16.00    Pack years: 20.00    Types: Cigarettes    Last attempt to quit: 03/25/1978    Years since quitting: 39.6  . Smokeless tobacco: Never Used  . Tobacco comment: quit more than 30 years ago  Substance and Sexual Activity  . Alcohol use: No  . Drug use: No  . Sexual activity: Not on file  Lifestyle  . Physical activity:    Days per week: Not on file    Minutes per session: Not on file  . Stress: Not on file  Relationships  . Social connections:    Talks on phone: Not on file    Gets together: Not on file    Attends religious service: Not on file    Active member of club or organization: Not on file    Attends meetings of clubs or organizations: Not on file    Relationship status: Not on file  . Intimate partner violence:    Fear of current or ex partner: Not on file    Emotionally abused: Not on file    Physically abused: Not on file    Forced sexual activity: Not on file  Other Topics Concern  . Not on file  Social History Narrative  . Not on file     Vitals:    10/23/17 1139  BP: 138/62  Pulse: 69  SpO2: 98%  Weight: 193 lb 12.8 oz (87.9 kg)  Height: 5\' 5"  (1.651 m)    Wt Readings from Last 3 Encounters:  10/23/17 193 lb 12.8  oz (87.9 kg)  10/20/17 197 lb (89.4 kg)  10/10/17 195 lb (88.5 kg)     PHYSICAL EXAM General: NAD HEENT: Normal. Neck: No JVD, no thyromegaly. Lungs: Clear to auscultation bilaterally with normal respiratory effort. CV: Regular rate and rhythm, normal S1/S2, no S3/S4, no murmur. No pretibial or periankle edema.  No carotid bruit.   Abdomen: Soft, nontender, no distention.  Neurologic: Alert and oriented.  Psych: Normal affect. Skin: Normal. Musculoskeletal: No gross deformities.    ECG: Reviewed above under Subjective   Labs: Lab Results  Component Value Date/Time   K 4.4 10/17/2017 10:57 AM   BUN 17 10/17/2017 10:57 AM   CREATININE 1.28 (H) 10/17/2017 10:57 AM   ALT 23 09/13/2017 07:47 AM   TSH 1.064 09/12/2017 02:50 PM   HGB 10.3 (L) 10/10/2017 02:52 PM     Lipids: Lab Results  Component Value Date/Time   LDLCALC 75 09/13/2017 04:23 AM   CHOL 152 09/13/2017 04:23 AM   TRIG 209 (H) 09/13/2017 04:23 AM   HDL 35 (L) 09/13/2017 04:23 AM       ASSESSMENT AND PLAN:  1.  Coronary disease status post three-vessel CABG with non-STEMI: Symptomatically stable.  Currently on aspirin, Lipitor 80 mg, benazepril (I will increase to 20 mg for more optimal control of blood pressure), and carvedilol.  I will make a referral to cardiac rehabilitation.  2.  Ischemic cardiomyopathy, LVEF 35 to 40%: I will reassess cardiac structure and function in several months after she has been on optimal medical therapy.  Continue carvedilol and benazepril.  I will increase benazepril to 20 mg for more optimal control of blood pressure.  She is on HCTZ 25 mg.  3.  Postoperative atrial fibrillation: Symptomatically stable.  I will continue amiodarone for another 2 months and then plan to discontinue it.  Continue Eliquis  for now.  4.  Hypertension: Blood pressure is normal in our office today but was elevated at the CT surgery office on 7/29.  I reviewed her blood pressure log which demonstrated several systolic readings in the 938-182 range.  I will increase benazepril to 20 mg daily.  5.  Hyperlipidemia: Continue high intensity statin therapy with atorvastatin 80 mg.  6.  Bilateral carotid artery disease: 1 to 39% stenosis in the right ICA and 40 to 59% stenosis in the left ICA on 09/17/2017.  Continue aspirin and statin.  I will monitor with surveillance Dopplers.  7.  History of DVT/PE: I will defer timing of discontinuation of Eliquis to her PCP.  If there is no further recurrence of atrial fibrillation, she would not need Eliquis for this reason.   Disposition: Follow up 3 months   Kate Sable, M.D., F.A.C.C.

## 2017-11-14 DIAGNOSIS — E1149 Type 2 diabetes mellitus with other diabetic neurological complication: Secondary | ICD-10-CM | POA: Diagnosis not present

## 2017-11-14 DIAGNOSIS — I10 Essential (primary) hypertension: Secondary | ICD-10-CM | POA: Diagnosis not present

## 2017-11-17 ENCOUNTER — Other Ambulatory Visit: Payer: Self-pay | Admitting: Cardiovascular Disease

## 2017-11-17 MED ORDER — AMIODARONE HCL 200 MG PO TABS: 200.0000 mg | ORAL_TABLET | Freq: Every day | ORAL | 1 refills | Status: DC

## 2017-11-17 MED ORDER — ATORVASTATIN CALCIUM 80 MG PO TABS: 80.0000 mg | ORAL_TABLET | Freq: Every day | ORAL | 3 refills | Status: DC

## 2017-11-17 NOTE — Telephone Encounter (Signed)
30 day supply Amiodarone to [facility] (pt to stop Dec 23 2017)    90 day supply Atorvastatin to [facility] per pt

## 2017-11-25 ENCOUNTER — Ambulatory Visit (INDEPENDENT_AMBULATORY_CARE_PROVIDER_SITE_OTHER): Payer: Self-pay | Admitting: Thoracic Surgery (Cardiothoracic Vascular Surgery)

## 2017-11-25 VITALS — BP 189/90 | HR 80 | Resp 20 | Ht 65.0 in | Wt 194.0 lb

## 2017-11-25 DIAGNOSIS — I2510 Atherosclerotic heart disease of native coronary artery without angina pectoris: Secondary | ICD-10-CM

## 2017-11-25 DIAGNOSIS — Z951 Presence of aortocoronary bypass graft: Secondary | ICD-10-CM

## 2017-11-25 NOTE — Progress Notes (Signed)
[address]       [place] [address]             [phone]    HPI: Mrs. Sobczyk returns for a scheduled postop visit  Tiwanna Tuch is a 72 year old woman with a past history of hypertension, hyperlipidemia, DVT, pulmonary embolus, type 2 diabetes, arthritis, and anxiety.  She presented with a non-ST elevation MI back in June.  She had severe two-vessel coronary disease.  She underwent coronary bypass grafting x 3 on 09/18/2017.  Postoperatively she had atrial fibrillation.  She was discharged on Eliquis.  She still has some incisional discomfort.  She complains of numbness along the left side of the sternum.  She has not had any anginal type pain.  She denies any heart failure symptoms.  She is not taking any narcotics.  Past Medical History:  Diagnosis Date  . Anxiety neurosis   . Arthritis   . CAD (coronary artery disease)    a. 08/2017: abnormal nuc-> sent directly to [facility], NSTEMI, s/p CABG (left internal mammary artery to left anterior descending, sequential saphenous vein graft to ramus intermedius branches 1 and 2).  . Carotid artery disease ([place])   . Chronic anemia   . Dizziness   . GERD (gastroesophageal reflux disease)   . Glaucoma   . Hiatal hernia   . Hyperlipemia   . Hypertension   . Ischemic cardiomyopathy   . Joint pain   . Postoperative atrial fibrillation ([place]) 08/2017  . Pulmonary embolus ([place])   . Type 2 diabetes mellitus ([place])     Current Outpatient Medications  Medication Sig Dispense Refill  . ALPRAZolam (XANAX) 0.5 MG tablet Take 0.5 mg by mouth 2 (two) times daily.    Marland Kitchen amiodarone (PACERONE) 200 MG tablet Take 1 tablet (200 mg total) by mouth daily. STOP TAKING Dec 23 2017 30 tablet 1  . amLODipine (NORVASC) 10 MG tablet Take 10 mg by mouth daily.    Marland Kitchen apixaban (ELIQUIS) 5 MG TABS tablet Take 5 mg by mouth 2 (two) times daily.    Marland Kitchen aspirin 81 MG tablet Take 1 tablet (81 mg total) by mouth daily.    Marland Kitchen atorvastatin (LIPITOR) 80 MG  tablet Take 1 tablet (80 mg total) by mouth daily at 6 PM. 90 tablet 3  . benazepril (LOTENSIN) 20 MG tablet Take 1 tablet (20 mg total) by mouth daily. 90 tablet 3  . carvedilol (COREG) 3.125 MG tablet Take 1 tablet (3.125 mg total) by mouth 2 (two) times daily. 60 tablet 1  . fish oil-omega-3 fatty acids 1000 MG capsule Take 1 g by mouth 3 (three) times daily.     Marland Kitchen glipiZIDE (GLUCOTROL) 5 MG tablet Take 5 mg by mouth 2 (two) times daily before a meal.    . hydrochlorothiazide (HYDRODIURIL) 25 MG tablet Take 25 mg by mouth daily.    . magnesium oxide (MAG-OX) 400 MG tablet Take 1 tablet (400 mg total) by mouth 2 (two) times daily. 180 tablet 3  . meclizine (ANTIVERT) 25 MG tablet Take 25 mg by mouth 3 (three) times daily as needed for dizziness.    . metFORMIN (GLUCOPHAGE) 1000 MG tablet Take 1,000 mg by mouth 2 (two) times daily with a meal.    . omeprazole (PRILOSEC) 40 MG capsule Take 40 mg by mouth daily.    Marland Kitchen tiZANidine (ZANAFLEX) 4 MG tablet Take 1 tablet (4 mg total) by mouth every 6 (six) hours as needed  for muscle spasms. 60 tablet 0   No current facility-administered medications for this visit.     Physical Exam BP (!) 189/90   Pulse 80   Resp 20   Ht 5\' 5"  (1.651 m)   Wt 194 lb (88 kg)   SpO2 98% Comment: RA  BMI 32.52 kg/m  72 year old woman in no acute distress Alert and oriented x3 with no focal neurologic deficits Cardiac regular rate and rhythm normal S1 and S2 Lungs clear with equal breath sounds bilaterally Sternum, tenderness to palpation, sternum stable, incision well-healed Leg incisions well-healed No peripheral edema  Impression: Mrs. Sakata is a 72 year old woman who is now 2 months out from coronary bypass grafting.  She has not had any recurrent angina.  Her incisions are healing well.  She does still have some sternal incisional pain, but is not requiring any narcotics.  Postoperative atrial fibrillation-she is in sinus rhythm currently.  She is on  Eliquis.  I will defer to Dr. Bronson Ing and Dr. Sherrie Sport as to when to stop that medication.  At this point her activities are unrestricted, but she was cautioned to build into new activities gradually over time.  Plan: Follow-up as scheduled with Dr. Bronson Ing.  I will be happy to see her back anytime in the future if I can be of any further assistance with her care.  [name], MD [facility] [phone]

## 2017-12-09 DIAGNOSIS — L03032 Cellulitis of left toe: Secondary | ICD-10-CM | POA: Diagnosis not present

## 2017-12-09 DIAGNOSIS — M79675 Pain in left toe(s): Secondary | ICD-10-CM | POA: Diagnosis not present

## 2017-12-10 DIAGNOSIS — I10 Essential (primary) hypertension: Secondary | ICD-10-CM | POA: Diagnosis not present

## 2017-12-10 DIAGNOSIS — E1149 Type 2 diabetes mellitus with other diabetic neurological complication: Secondary | ICD-10-CM | POA: Diagnosis not present

## 2017-12-23 DIAGNOSIS — M79676 Pain in unspecified toe(s): Secondary | ICD-10-CM | POA: Diagnosis not present

## 2017-12-23 DIAGNOSIS — B351 Tinea unguium: Secondary | ICD-10-CM | POA: Diagnosis not present

## 2018-01-07 DIAGNOSIS — Z23 Encounter for immunization: Secondary | ICD-10-CM | POA: Diagnosis not present

## 2018-01-09 DIAGNOSIS — E1149 Type 2 diabetes mellitus with other diabetic neurological complication: Secondary | ICD-10-CM | POA: Diagnosis not present

## 2018-01-09 DIAGNOSIS — I10 Essential (primary) hypertension: Secondary | ICD-10-CM | POA: Diagnosis not present

## 2018-01-13 DIAGNOSIS — E1149 Type 2 diabetes mellitus with other diabetic neurological complication: Secondary | ICD-10-CM | POA: Diagnosis not present

## 2018-01-13 DIAGNOSIS — F411 Generalized anxiety disorder: Secondary | ICD-10-CM | POA: Diagnosis not present

## 2018-01-13 DIAGNOSIS — I25728 Atherosclerosis of autologous artery coronary artery bypass graft(s) with other forms of angina pectoris: Secondary | ICD-10-CM | POA: Diagnosis not present

## 2018-01-13 DIAGNOSIS — K210 Gastro-esophageal reflux disease with esophagitis: Secondary | ICD-10-CM | POA: Diagnosis not present

## 2018-01-13 DIAGNOSIS — I10 Essential (primary) hypertension: Secondary | ICD-10-CM | POA: Diagnosis not present

## 2018-01-13 DIAGNOSIS — Z0000 Encounter for general adult medical examination without abnormal findings: Secondary | ICD-10-CM | POA: Diagnosis not present

## 2018-01-13 DIAGNOSIS — Z1389 Encounter for screening for other disorder: Secondary | ICD-10-CM | POA: Diagnosis not present

## 2018-01-16 ENCOUNTER — Encounter (HOSPITAL_COMMUNITY): Payer: Medicare Other

## 2018-01-28 ENCOUNTER — Ambulatory Visit (INDEPENDENT_AMBULATORY_CARE_PROVIDER_SITE_OTHER): Payer: Medicare Other | Admitting: Cardiovascular Disease

## 2018-01-28 ENCOUNTER — Encounter: Payer: Self-pay | Admitting: Cardiovascular Disease

## 2018-01-28 VITALS — BP 142/68 | HR 75 | Ht 65.0 in | Wt 197.0 lb

## 2018-01-28 DIAGNOSIS — Z86711 Personal history of pulmonary embolism: Secondary | ICD-10-CM

## 2018-01-28 DIAGNOSIS — I9789 Other postprocedural complications and disorders of the circulatory system, not elsewhere classified: Secondary | ICD-10-CM | POA: Diagnosis not present

## 2018-01-28 DIAGNOSIS — I6523 Occlusion and stenosis of bilateral carotid arteries: Secondary | ICD-10-CM | POA: Diagnosis not present

## 2018-01-28 DIAGNOSIS — I25708 Atherosclerosis of coronary artery bypass graft(s), unspecified, with other forms of angina pectoris: Secondary | ICD-10-CM | POA: Diagnosis not present

## 2018-01-28 DIAGNOSIS — E785 Hyperlipidemia, unspecified: Secondary | ICD-10-CM | POA: Diagnosis not present

## 2018-01-28 DIAGNOSIS — I10 Essential (primary) hypertension: Secondary | ICD-10-CM

## 2018-01-28 DIAGNOSIS — Z79899 Other long term (current) drug therapy: Secondary | ICD-10-CM

## 2018-01-28 DIAGNOSIS — I4891 Unspecified atrial fibrillation: Secondary | ICD-10-CM

## 2018-01-28 DIAGNOSIS — Z951 Presence of aortocoronary bypass graft: Secondary | ICD-10-CM

## 2018-01-28 DIAGNOSIS — I255 Ischemic cardiomyopathy: Secondary | ICD-10-CM | POA: Diagnosis not present

## 2018-01-28 MED ORDER — BENAZEPRIL HCL 40 MG PO TABS: 40.0000 mg | ORAL_TABLET | Freq: Every day | ORAL | 3 refills | Status: DC

## 2018-01-28 NOTE — Progress Notes (Signed)
SUBJECTIVE: The patient presents for routine follow-up.  She underwent three-vessel CABG on 09/18/2017 (left internal mammary artery to left anterior descending, sequential saphenous vein graft to ramus intermedius branches 1 and 2).  She has a history of ischemic cardiomyopathy and DVT/PE, as well as postoperative atrial fibrillation.  She is doing fairly well overall.  She denies exertional chest pain and dyspnea.  She denies palpitations and leg swelling.  She used to have leg cramps prior to bypass surgery but since then and after starting magnesium oxide, they have essentially resolved.  She has had some thinning of her scalp and wonders if it is due to carvedilol.  However, she wants to remain on this medication.  She brought in her blood pressure log which I reviewed and demonstrated several systolic readings in the 017-793 range.     Review of Systems: As per "subjective", otherwise negative.  Allergies  Allergen Reactions  . Beta Adrenergic Blockers Other (See Comments)    Dropped heart rate to the 40's  . Calcium Channel Blockers Other (See Comments)    Dropped HR into the 40's  . Naproxen Hives    Current Outpatient Medications  Medication Sig Dispense Refill  . ALPRAZolam (XANAX) 0.5 MG tablet Take 0.5 mg by mouth 2 (two) times daily.    Marland Kitchen amLODipine (NORVASC) 10 MG tablet Take 10 mg by mouth daily.    Marland Kitchen apixaban (ELIQUIS) 5 MG TABS tablet Take 5 mg by mouth 2 (two) times daily.    Marland Kitchen aspirin 81 MG tablet Take 1 tablet (81 mg total) by mouth daily.    Marland Kitchen atorvastatin (LIPITOR) 80 MG tablet Take 1 tablet (80 mg total) by mouth daily at 6 PM. 90 tablet 3  . benazepril (LOTENSIN) 20 MG tablet Take 1 tablet (20 mg total) by mouth daily. 90 tablet 3  . carvedilol (COREG) 3.125 MG tablet Take 1 tablet (3.125 mg total) by mouth 2 (two) times daily. 60 tablet 1  . fish oil-omega-3 fatty acids 1000 MG capsule Take 1 g by mouth 3 (three) times daily.     Marland Kitchen glipiZIDE  (GLUCOTROL) 5 MG tablet Take 5 mg by mouth 2 (two) times daily before a meal.    . hydrochlorothiazide (HYDRODIURIL) 25 MG tablet Take 25 mg by mouth daily.    . magnesium oxide (MAG-OX) 400 MG tablet Take 1 tablet (400 mg total) by mouth 2 (two) times daily. 180 tablet 3  . meclizine (ANTIVERT) 25 MG tablet Take 25 mg by mouth 3 (three) times daily as needed for dizziness.    . metFORMIN (GLUCOPHAGE) 1000 MG tablet Take 1,000 mg by mouth 2 (two) times daily with a meal.    . omeprazole (PRILOSEC) 40 MG capsule Take 40 mg by mouth daily.    Marland Kitchen tiZANidine (ZANAFLEX) 4 MG tablet Take 1 tablet (4 mg total) by mouth every 6 (six) hours as needed for muscle spasms. 60 tablet 0   No current facility-administered medications for this visit.     Past Medical History:  Diagnosis Date  . Anxiety neurosis   . Arthritis   . CAD (coronary artery disease)    a. 08/2017: abnormal nuc-> sent directly to [facility], NSTEMI, s/p CABG (left internal mammary artery to left anterior descending, sequential saphenous vein graft to ramus intermedius branches 1 and 2).  . Carotid artery disease ([place])   . Chronic anemia   . Dizziness   . GERD (gastroesophageal reflux disease)   . Glaucoma   .  Hiatal hernia   . Hyperlipemia   . Hypertension   . Ischemic cardiomyopathy   . Joint pain   . Postoperative atrial fibrillation ([place]) 08/2017  . Pulmonary embolus ([place])   . Type 2 diabetes mellitus ([place])     Past Surgical History:  Procedure Laterality Date  . CORONARY ARTERY BYPASS GRAFT N/A 09/18/2017   Procedure: CORONARY ARTERY BYPASS GRAFTING (CABG);  Surgeon: [name], MD;  Location: [facility];  Service: Open Heart Surgery;  Laterality: N/A;  Saphenous vein harvest. LIMA to LAD Saphenous vein (sequential) ramus 1 & 2  . HERNIA REPAIR    . LEFT HEART CATH AND CORONARY ANGIOGRAPHY N/A 09/15/2017   Procedure: LEFT HEART CATH AND CORONARY ANGIOGRAPHY;  Surgeon: Lorretta Harp, MD;  Location: [facility];  Service: Cardiovascular;  Laterality: N/A;  . SHOULDER SURGERY     LEFT  . TEE WITHOUT CARDIOVERSION N/A 09/18/2017   Procedure: TRANSESOPHAGEAL ECHOCARDIOGRAM (TEE);  Surgeon: [name], MD;  Location: [facility];  Service: Open Heart Surgery;  Laterality: N/A;  . TOTAL KNEE ARTHROPLASTY     LEFT  . VAGINAL HYSTERECTOMY      Social History   Socioeconomic History  . Marital status: Married    Spouse name: Not on file  . Number of children: Not on file  . Years of education: Not on file  . Highest education level: Not on file  Occupational History  . Not on file  Social Needs  . Financial resource strain: Not on file  . Food insecurity:    Worry: Not on file    Inability: Not on file  . Transportation needs:    Medical: Not on file    Non-medical: Not on file  Tobacco Use  . Smoking status: Former Smoker    Packs/day: 1.25    Years: 16.00    Pack years: 20.00    Types: Cigarettes    Last attempt to quit: 03/25/1978    Years since quitting: 39.8  . Smokeless tobacco: Never Used  . Tobacco comment: quit more than 30 years ago  Substance and Sexual Activity  . Alcohol use: No  . Drug use: No  . Sexual activity: Not on file  Lifestyle  . Physical activity:    Days per week: Not on file    Minutes per session: Not on file  . Stress: Not on file  Relationships  . Social connections:    Talks on phone: Not on file    Gets together: Not on file    Attends religious service: Not on file    Active member of club or organization: Not on file    Attends meetings of clubs or organizations: Not on file    Relationship status: Not on file  . Intimate partner violence:    Fear of current or ex partner: Not on file    Emotionally abused: Not on file    Physically abused: Not on file    Forced sexual activity: Not on file  Other Topics Concern  . Not on file  Social History Narrative  . Not on file     Vitals:   01/28/18 1139  BP: (!) 142/68  Pulse: 75    SpO2: 97%  Weight: 197 lb (89.4 kg)  Height: 5\' 5"  (1.651 m)    Wt Readings from Last 3 Encounters:  01/28/18 197 lb (89.4 kg)  11/25/17 194 lb (88 kg)  10/23/17 193 lb 12.8 oz (87.9 kg)  PHYSICAL EXAM General: NAD HEENT: Normal. Neck: No JVD, no thyromegaly. Lungs: Clear to auscultation bilaterally with normal respiratory effort. CV: Regular rate and rhythm, normal S1/S2, no S3/S4, no murmur. No pretibial or periankle edema.  No carotid bruit.   Abdomen: Soft, nontender, no distention.  Neurologic: Alert and oriented.  Psych: Normal affect. Skin: Normal. Musculoskeletal: No gross deformities.    ECG: Reviewed above under Subjective   Labs: Lab Results  Component Value Date/Time   K 4.4 10/17/2017 10:57 AM   BUN 17 10/17/2017 10:57 AM   CREATININE 1.28 (H) 10/17/2017 10:57 AM   ALT 23 09/13/2017 07:47 AM   TSH 1.064 09/12/2017 02:50 PM   HGB 10.3 (L) 10/10/2017 02:52 PM     Lipids: Lab Results  Component Value Date/Time   LDLCALC 75 09/13/2017 04:23 AM   CHOL 152 09/13/2017 04:23 AM   TRIG 209 (H) 09/13/2017 04:23 AM   HDL 35 (L) 09/13/2017 04:23 AM       ASSESSMENT AND PLAN:  1.  Coronary disease status post three-vessel CABG with non-STEMI: Symptomatically stable.  Currently on aspirin, Lipitor 80 mg, benazepril, and carvedilol.  I will increase benazepril to 40 mg for more optimal blood pressure control.  2.  Ischemic cardiomyopathy, LVEF 35 to 40%: I will reassess cardiac structure and function in several months after she has been on optimal medical therapy.  Continue carvedilol and benazepril.  I will increase benazepril to 40 mg for more optimal blood pressure control. She is also on HCTZ 25 mg.  3.  Postoperative atrial fibrillation: Symptomatically stable without documented recurrence.  She is on Eliquis for history of DVT and PE.  This is being managed by PCP.  She does not require Eliquis for atrial fibrillation.  4.  Hypertension: Mildly  elevated today with several systolic readings at home in the 150-170 range.  I will increase benazepril to 40 mg for more optimal blood pressure control.  5.  Hyperlipidemia: Continue high intensity statin therapy with atorvastatin 80 mg.  6.  Bilateral carotid artery disease: 1 to 39% stenosis in the right ICA and 40 to 59% stenosis in the left ICA on 09/17/2017.  Continue aspirin and statin.  I will monitor with surveillance Dopplers.  7.  History of DVT/PE: I will defer timing of discontinuation of Eliquis to her PCP.    There has been no documented recurrence of atrial fibrillation and thus does not require Eliquis for this reason.  I will try to obtain hospitalization records including radiographic images.   Disposition: Follow up 6 months   Kate Sable, M.D., F.A.C.C.

## 2018-01-28 NOTE — Patient Instructions (Signed)
Medication Instructions:  INCREASE Benazapril to 40 mg daily If you need a refill on your cardiac medications before your next appointment, please call your pharmacy.   Lab work: None If you have labs (blood work) drawn today and your tests are completely normal, you will receive your results only by: Marland Kitchen MyChart Message (if you have MyChart) OR . A paper copy in the mail If you have any lab test that is abnormal or we need to change your treatment, we will call you to review the results.  Testing/Procedures: None  Follow-Up: At [facility], you and your health needs are our priority.  As part of our continuing mission to provide you with exceptional heart care, we have created designated Provider Care Teams.  These Care Teams include your primary Cardiologist (physician) and Advanced Practice Providers (APPs -  Physician Assistants and Nurse Practitioners) who all work together to provide you with the care you need, when you need it. You will need a follow up appointment in 6 months.  Please call our office 2 months in advance to schedule this appointment.  You may see Kate Sable, MD or one of the following Advanced Practice Providers on your designated Care Team:   Bernerd Pho, PA-C [facility]) . Ermalinda Barrios, PA-C ([place])  Any Other Special Instructions Will Be Listed Below (If Applicable). None

## 2018-02-05 ENCOUNTER — Telehealth: Payer: Self-pay | Admitting: Cardiovascular Disease

## 2018-02-05 NOTE — Telephone Encounter (Signed)
Please call patient back regarding medicatioin questions. / tg

## 2018-02-06 DIAGNOSIS — E1149 Type 2 diabetes mellitus with other diabetic neurological complication: Secondary | ICD-10-CM | POA: Diagnosis not present

## 2018-02-06 DIAGNOSIS — K210 Gastro-esophageal reflux disease with esophagitis: Secondary | ICD-10-CM | POA: Diagnosis not present

## 2018-02-06 DIAGNOSIS — F411 Generalized anxiety disorder: Secondary | ICD-10-CM | POA: Diagnosis not present

## 2018-02-06 DIAGNOSIS — I10 Essential (primary) hypertension: Secondary | ICD-10-CM | POA: Diagnosis not present

## 2018-02-06 NOTE — Telephone Encounter (Signed)
Returned pt call. Advised pt that I sent LOV note to Dr. Telford Nab to review concerning her Eliquis.

## 2018-02-12 DIAGNOSIS — L03032 Cellulitis of left toe: Secondary | ICD-10-CM | POA: Diagnosis not present

## 2018-02-12 DIAGNOSIS — M79675 Pain in left toe(s): Secondary | ICD-10-CM | POA: Diagnosis not present

## 2018-02-16 DIAGNOSIS — Z961 Presence of intraocular lens: Secondary | ICD-10-CM | POA: Diagnosis not present

## 2018-02-16 DIAGNOSIS — Z7984 Long term (current) use of oral hypoglycemic drugs: Secondary | ICD-10-CM | POA: Diagnosis not present

## 2018-02-16 DIAGNOSIS — H401131 Primary open-angle glaucoma, bilateral, mild stage: Secondary | ICD-10-CM | POA: Diagnosis not present

## 2018-02-16 DIAGNOSIS — E119 Type 2 diabetes mellitus without complications: Secondary | ICD-10-CM | POA: Diagnosis not present

## 2018-03-03 ENCOUNTER — Telehealth: Payer: Self-pay | Admitting: Cardiovascular Disease

## 2018-03-03 DIAGNOSIS — E785 Hyperlipidemia, unspecified: Secondary | ICD-10-CM

## 2018-03-03 NOTE — Telephone Encounter (Signed)
Will forward to Dr. Koneswaran to advise.  

## 2018-03-03 NOTE — Telephone Encounter (Signed)
Pt made aware. Will mail lab slip.

## 2018-03-03 NOTE — Telephone Encounter (Signed)
She could try stopping it to see if this improves. If so, we would need to repeat lipids in 3 months.

## 2018-03-03 NOTE — Telephone Encounter (Signed)
Please give pt a call she's having hair loss and she thinks it's a side effect from her atorvastatin (LIPITOR) 80 MG tablet [[id number]]

## 2018-03-30 ENCOUNTER — Telehealth: Payer: Self-pay | Admitting: Cardiovascular Disease

## 2018-03-30 NOTE — Telephone Encounter (Signed)
Per Gerrianne Scale PA-C we cannot recommend anything that is not FDA tested, patient made aware.States she will go see her pcp

## 2018-03-30 NOTE — Telephone Encounter (Signed)
Wants to know if it's ok for her to use CBD oil 60mg  musclar gel for her back pain.

## 2018-04-02 DIAGNOSIS — L03032 Cellulitis of left toe: Secondary | ICD-10-CM | POA: Diagnosis not present

## 2018-04-02 DIAGNOSIS — M79675 Pain in left toe(s): Secondary | ICD-10-CM | POA: Diagnosis not present

## 2018-04-14 DIAGNOSIS — I25728 Atherosclerosis of autologous artery coronary artery bypass graft(s) with other forms of angina pectoris: Secondary | ICD-10-CM | POA: Diagnosis not present

## 2018-04-14 DIAGNOSIS — K210 Gastro-esophageal reflux disease with esophagitis: Secondary | ICD-10-CM | POA: Diagnosis not present

## 2018-04-14 DIAGNOSIS — I10 Essential (primary) hypertension: Secondary | ICD-10-CM | POA: Diagnosis not present

## 2018-04-14 DIAGNOSIS — E1149 Type 2 diabetes mellitus with other diabetic neurological complication: Secondary | ICD-10-CM | POA: Diagnosis not present

## 2018-04-14 DIAGNOSIS — F411 Generalized anxiety disorder: Secondary | ICD-10-CM | POA: Diagnosis not present

## 2018-04-16 DIAGNOSIS — L02612 Cutaneous abscess of left foot: Secondary | ICD-10-CM | POA: Diagnosis not present

## 2018-04-28 DIAGNOSIS — L03032 Cellulitis of left toe: Secondary | ICD-10-CM | POA: Diagnosis not present

## 2018-04-29 DIAGNOSIS — K210 Gastro-esophageal reflux disease with esophagitis: Secondary | ICD-10-CM | POA: Diagnosis not present

## 2018-04-29 DIAGNOSIS — E1149 Type 2 diabetes mellitus with other diabetic neurological complication: Secondary | ICD-10-CM | POA: Diagnosis not present

## 2018-04-29 DIAGNOSIS — I10 Essential (primary) hypertension: Secondary | ICD-10-CM | POA: Diagnosis not present

## 2018-05-19 DIAGNOSIS — Z7984 Long term (current) use of oral hypoglycemic drugs: Secondary | ICD-10-CM | POA: Diagnosis not present

## 2018-05-19 DIAGNOSIS — H35371 Puckering of macula, right eye: Secondary | ICD-10-CM | POA: Diagnosis not present

## 2018-05-19 DIAGNOSIS — H401131 Primary open-angle glaucoma, bilateral, mild stage: Secondary | ICD-10-CM | POA: Diagnosis not present

## 2018-05-19 DIAGNOSIS — E119 Type 2 diabetes mellitus without complications: Secondary | ICD-10-CM | POA: Diagnosis not present

## 2018-05-25 DIAGNOSIS — I10 Essential (primary) hypertension: Secondary | ICD-10-CM | POA: Diagnosis not present

## 2018-05-25 DIAGNOSIS — E1149 Type 2 diabetes mellitus with other diabetic neurological complication: Secondary | ICD-10-CM | POA: Diagnosis not present

## 2018-05-25 DIAGNOSIS — K210 Gastro-esophageal reflux disease with esophagitis: Secondary | ICD-10-CM | POA: Diagnosis not present

## 2018-06-26 DIAGNOSIS — K210 Gastro-esophageal reflux disease with esophagitis: Secondary | ICD-10-CM | POA: Diagnosis not present

## 2018-06-26 DIAGNOSIS — I10 Essential (primary) hypertension: Secondary | ICD-10-CM | POA: Diagnosis not present

## 2018-06-26 DIAGNOSIS — E1149 Type 2 diabetes mellitus with other diabetic neurological complication: Secondary | ICD-10-CM | POA: Diagnosis not present

## 2018-07-20 DIAGNOSIS — E1149 Type 2 diabetes mellitus with other diabetic neurological complication: Secondary | ICD-10-CM | POA: Diagnosis not present

## 2018-07-20 DIAGNOSIS — K210 Gastro-esophageal reflux disease with esophagitis: Secondary | ICD-10-CM | POA: Diagnosis not present

## 2018-07-20 DIAGNOSIS — N182 Chronic kidney disease, stage 2 (mild): Secondary | ICD-10-CM | POA: Diagnosis not present

## 2018-07-20 DIAGNOSIS — F411 Generalized anxiety disorder: Secondary | ICD-10-CM | POA: Diagnosis not present

## 2018-07-20 DIAGNOSIS — I25728 Atherosclerosis of autologous artery coronary artery bypass graft(s) with other forms of angina pectoris: Secondary | ICD-10-CM | POA: Diagnosis not present

## 2018-07-20 DIAGNOSIS — I10 Essential (primary) hypertension: Secondary | ICD-10-CM | POA: Diagnosis not present

## 2018-07-28 ENCOUNTER — Telehealth: Payer: Self-pay | Admitting: Cardiovascular Disease

## 2018-07-28 NOTE — Telephone Encounter (Signed)
Virtual Visit Pre-Appointment Phone Call  "(Name), I am calling you today to discuss your upcoming appointment. We are currently trying to limit exposure to the virus that causes COVID-19 by seeing patients at home rather than in the office."  "What is the BEST phone number to call the day of the visit?" -    [phone] 1. Do you have or have access to (through a family member/friend) a smartphone with video capability that we can use for your visit?" a. If yes - list this number in appt notes as cell (if different from BEST phone #) and list the appointment type as a VIDEO visit in appointment notes b. If no - list the appointment type as a PHONE visit in appointment notes  2. Confirm consent - "In the setting of the current Covid19 crisis, you are scheduled for a (phone or video) visit with your provider on (date) at (time).  Just as we do with many in-office visits, in order for you to participate in this visit, we must obtain consent.  If you'd like, I can send this to your mychart (if signed up) or email for you to review.  Otherwise, I can obtain your verbal consent now.  All virtual visits are billed to your insurance company just like a normal visit would be.  By agreeing to a virtual visit, we'd like you to understand that the technology does not allow for your provider to perform an examination, and thus may limit your provider's ability to fully assess your condition. If your provider identifies any concerns that need to be evaluated in person, we will make arrangements to do so.  Finally, though the technology is pretty good, we cannot assure that it will always work on either your or our end, and in the setting of a video visit, we may have to convert it to a phone-only visit.  In either situation, we cannot ensure that we have a secure connection.  Are you willing to proceed?" STAFF: Did the patient verbally acknowledge consent to telehealth visit? Document YES/NO here: YES    3. Advise patient to be prepared - "Two hours prior to your appointment, go ahead and check your blood pressure, pulse, oxygen saturation, and your weight (if you have the equipment to check those) and write them all down. When your visit starts, your provider will ask you for this information. If you have an Apple Watch or Kardia device, please plan to have heart rate information ready on the day of your appointment. Please have a pen and paper handy nearby the day of the visit as well."  4. Give patient instructions for MyChart download to smartphone OR Doximity/Doxy.me as below if video visit (depending on what platform provider is using)  5. Inform patient they will receive a phone call 15 minutes prior to their appointment time (may be from unknown caller ID) so they should be prepared to answer    TELEPHONE CALL NOTE  Karen Tate has been deemed a candidate for a follow-up tele-health visit to limit community exposure during the Covid-19 pandemic. I spoke with the patient via phone to ensure availability of phone/video source, confirm preferred email & phone number, and discuss instructions and expectations.  I reminded Karen Tate to be prepared with any vital sign and/or heart rhythm information that could potentially be obtained via home monitoring, at the time of her visit. I reminded Karen Tate to expect a phone call prior to her visit.  Lynnda Child Slaughter 07/28/2018 12:45 PM   INSTRUCTIONS FOR DOWNLOADING THE MYCHART APP TO SMARTPHONE  - The patient must first make sure to have activated MyChart and know their login information - If Apple, go to [employer] and type in MyChart in the search bar and download the app. If Android, ask patient to go to [employer] and type in [place] in the search bar and download the app. The app is free but as with any other app downloads, their phone may require them to verify saved payment information or Apple/Android password.  - The  patient will need to then log into the app with their MyChart username and password, and select [facility] as their healthcare provider to link the account. When it is time for your visit, go to the MyChart app, find appointments, and click Begin Video Visit. Be sure to Select Allow for your device to access the Microphone and Camera for your visit. You will then be connected, and your provider will be with you shortly.  **If they have any issues connecting, or need assistance please contact MyChart service desk [phone])**  **If using a computer, in order to ensure the best quality for their visit they will need to use either of the following Internet Browsers: [employer], or Google Chrome**  IF USING DOXIMITY or DOXY.ME - The patient will receive a link just prior to their visit by text.     FULL LENGTH CONSENT FOR TELE-HEALTH VISIT   I hereby voluntarily request, consent and authorize [facility] and its employed or contracted physicians, physician assistants, nurse practitioners or other licensed health care professionals (the Practitioner), to provide me with telemedicine health care services (the Services") as deemed necessary by the treating Practitioner. I acknowledge and consent to receive the Services by the Practitioner via telemedicine. I understand that the telemedicine visit will involve communicating with the Practitioner through live audiovisual communication technology and the disclosure of certain medical information by electronic transmission. I acknowledge that I have been given the opportunity to request an in-person assessment or other available alternative prior to the telemedicine visit and am voluntarily participating in the telemedicine visit.  I understand that I have the right to withhold or withdraw my consent to the use of telemedicine in the course of my care at any time, without affecting my right to future care or treatment, and that the  Practitioner or I may terminate the telemedicine visit at any time. I understand that I have the right to inspect all information obtained and/or recorded in the course of the telemedicine visit and may receive copies of available information for a reasonable fee.  I understand that some of the potential risks of receiving the Services via telemedicine include:   Delay or interruption in medical evaluation due to technological equipment failure or disruption;  Information transmitted may not be sufficient (e.g. poor resolution of images) to allow for appropriate medical decision making by the Practitioner; and/or   In rare instances, security protocols could fail, causing a breach of personal health information.  Furthermore, I acknowledge that it is my responsibility to provide information about my medical history, conditions and care that is complete and accurate to the best of my ability. I acknowledge that Practitioner's advice, recommendations, and/or decision may be based on factors not within their control, such as incomplete or inaccurate data provided by me or distortions of diagnostic images or specimens that may result from electronic transmissions. I understand that the  practice of medicine is not an Chief Strategy Officer and that Practitioner makes no warranties or guarantees regarding treatment outcomes. I acknowledge that I will receive a copy of this consent concurrently upon execution via email to the email address I last provided but may also request a printed copy by calling the office of [facility].    I understand that my insurance will be billed for this visit.   I have read or had this consent read to me.  I understand the contents of this consent, which adequately explains the benefits and risks of the Services being provided via telemedicine.   I have been provided ample opportunity to ask questions regarding this consent and the Services and have had my questions answered to my  satisfaction.  I give my informed consent for the services to be provided through the use of telemedicine in my medical care  By participating in this telemedicine visit I agree to the above.

## 2018-07-31 DIAGNOSIS — K210 Gastro-esophageal reflux disease with esophagitis: Secondary | ICD-10-CM | POA: Diagnosis not present

## 2018-07-31 DIAGNOSIS — I10 Essential (primary) hypertension: Secondary | ICD-10-CM | POA: Diagnosis not present

## 2018-07-31 DIAGNOSIS — E1149 Type 2 diabetes mellitus with other diabetic neurological complication: Secondary | ICD-10-CM | POA: Diagnosis not present

## 2018-07-31 DIAGNOSIS — N182 Chronic kidney disease, stage 2 (mild): Secondary | ICD-10-CM | POA: Diagnosis not present

## 2018-08-03 ENCOUNTER — Telehealth (INDEPENDENT_AMBULATORY_CARE_PROVIDER_SITE_OTHER): Payer: Medicare Other | Admitting: Cardiovascular Disease

## 2018-08-03 ENCOUNTER — Encounter: Payer: Self-pay | Admitting: Cardiovascular Disease

## 2018-08-03 VITALS — BP 166/76 | HR 76 | Ht 65.0 in | Wt 202.0 lb

## 2018-08-03 DIAGNOSIS — I25708 Atherosclerosis of coronary artery bypass graft(s), unspecified, with other forms of angina pectoris: Secondary | ICD-10-CM | POA: Diagnosis not present

## 2018-08-03 DIAGNOSIS — I6523 Occlusion and stenosis of bilateral carotid arteries: Secondary | ICD-10-CM

## 2018-08-03 DIAGNOSIS — Z713 Dietary counseling and surveillance: Secondary | ICD-10-CM

## 2018-08-03 DIAGNOSIS — I10 Essential (primary) hypertension: Secondary | ICD-10-CM

## 2018-08-03 DIAGNOSIS — I9789 Other postprocedural complications and disorders of the circulatory system, not elsewhere classified: Secondary | ICD-10-CM

## 2018-08-03 DIAGNOSIS — I255 Ischemic cardiomyopathy: Secondary | ICD-10-CM

## 2018-08-03 DIAGNOSIS — Z951 Presence of aortocoronary bypass graft: Secondary | ICD-10-CM

## 2018-08-03 DIAGNOSIS — E785 Hyperlipidemia, unspecified: Secondary | ICD-10-CM

## 2018-08-03 DIAGNOSIS — I4891 Unspecified atrial fibrillation: Secondary | ICD-10-CM

## 2018-08-03 DIAGNOSIS — Z9111 Patient's noncompliance with dietary regimen: Secondary | ICD-10-CM

## 2018-08-03 DIAGNOSIS — Z91119 Patient's noncompliance with dietary regimen due to unspecified reason: Secondary | ICD-10-CM

## 2018-08-03 NOTE — Progress Notes (Signed)
Virtual Visit via Telephone Note   This visit type was conducted due to national recommendations for restrictions regarding the COVID-19 Pandemic (e.g. social distancing) in an effort to limit this patient's exposure and mitigate transmission in our community.  Due to her co-morbid illnesses, this patient is at least at moderate risk for complications without adequate follow up.  This format is felt to be most appropriate for this patient at this time.  The patient did not have access to video technology/had technical difficulties with video requiring transitioning to audio format only (telephone).  All issues noted in this document were discussed and addressed.  No physical exam could be performed with this format.  Please refer to the patient's chart for her  consent to telehealth for [facility]   Date:  08/03/2018   ID:  Karen Tate, DOB [birth year], MRN [id number]  Patient Location: Home Provider Location: Home  PCP:  Neale Burly, MD  Cardiologist:  Kate Sable, MD  Electrophysiologist:  None   Evaluation Performed:  Follow-Up Visit  Chief Complaint:  CAD.  History of Present Illness:    Karen Tate is a 73 y.o. female with CAD. She underwent three-vessel CABG on 09/18/2017 (left internal mammary artery to left anterior descending, sequential saphenous vein graft to ramus intermedius branches 1 and 2). She has a history of ischemic cardiomyopathy and DVT/PE, as well as postoperative atrial fibrillation.  She has put on weight and admits she has been drinking a lot of sodas and eating fried chicken and going out to [place]. She does get any exercise. She denies chest pain. She has mild exertional dyspnea which is stable. Her feet swell if she's been standing for long periods of time.  She told me her PCP made several medication adjustments regarding antihypertensive therapy.  She wants to try taking Lipitor 80 mg again. She previously stopped it because she  thought it was leading to hair loss.  The patient does not have symptoms concerning for COVID-19 infection (fever, chills, cough, or new shortness of breath).    Past Medical History:  Diagnosis Date  . Anxiety neurosis   . Arthritis   . CAD (coronary artery disease)    a. 08/2017: abnormal nuc-> sent directly to [facility], NSTEMI, s/p CABG (left internal mammary artery to left anterior descending, sequential saphenous vein graft to ramus intermedius branches 1 and 2).  . Carotid artery disease ([place])   . Chronic anemia   . Dizziness   . GERD (gastroesophageal reflux disease)   . Glaucoma   . Hiatal hernia   . Hyperlipemia   . Hypertension   . Ischemic cardiomyopathy   . Joint pain   . Postoperative atrial fibrillation ([place]) 08/2017  . Pulmonary embolus ([place])   . Type 2 diabetes mellitus ([place])    Past Surgical History:  Procedure Laterality Date  . CORONARY ARTERY BYPASS GRAFT N/A 09/18/2017   Procedure: CORONARY ARTERY BYPASS GRAFTING (CABG);  Surgeon: [name], MD;  Location: [facility];  Service: Open Heart Surgery;  Laterality: N/A;  Saphenous vein harvest. LIMA to LAD Saphenous vein (sequential) ramus 1 & 2  . HERNIA REPAIR    . LEFT HEART CATH AND CORONARY ANGIOGRAPHY N/A 09/15/2017   Procedure: LEFT HEART CATH AND CORONARY ANGIOGRAPHY;  Surgeon: Lorretta Harp, MD;  Location: [facility];  Service: Cardiovascular;  Laterality: N/A;  . SHOULDER SURGERY     LEFT  . TEE WITHOUT CARDIOVERSION N/A 09/18/2017   Procedure:  TRANSESOPHAGEAL ECHOCARDIOGRAM (TEE);  Surgeon: [name], MD;  Location: [facility];  Service: Open Heart Surgery;  Laterality: N/A;  . TOTAL KNEE ARTHROPLASTY     LEFT  . VAGINAL HYSTERECTOMY       Current Meds  Medication Sig  . ALPRAZolam (XANAX) 0.5 MG tablet Take 0.5 mg by mouth 2 (two) times daily.  Marland Kitchen amLODipine (NORVASC) 10 MG tablet Take 10 mg by mouth daily.  Marland Kitchen aspirin 81 MG tablet Take 1 tablet (81 mg total) by mouth daily.   Marland Kitchen atorvastatin (LIPITOR) 80 MG tablet Take 1 tablet (80 mg total) by mouth daily at 6 PM.  . carvedilol (COREG) 12.5 MG tablet 12.5 mg 2 (two) times daily with a meal.   . chlorthalidone (HYGROTON) 25 MG tablet   . fish oil-omega-3 fatty acids 1000 MG capsule Take 1 g by mouth 3 (three) times daily.   Marland Kitchen glipiZIDE (GLUCOTROL) 5 MG tablet Take 5 mg by mouth 2 (two) times daily before a meal.  . lisinopril (ZESTRIL) 40 MG tablet   . magnesium oxide (MAG-OX) 400 MG tablet Take 1 tablet (400 mg total) by mouth 2 (two) times daily.  . meclizine (ANTIVERT) 25 MG tablet Take 25 mg by mouth 3 (three) times daily as needed for dizziness.  . metFORMIN (GLUCOPHAGE) 1000 MG tablet Take 1,000 mg by mouth 2 (two) times daily with a meal.  . omeprazole (PRILOSEC) 40 MG capsule Take 40 mg by mouth daily.  Marland Kitchen tiZANidine (ZANAFLEX) 4 MG tablet Take 1 tablet (4 mg total) by mouth every 6 (six) hours as needed for muscle spasms.  . [DISCONTINUED] carvedilol (COREG) 3.125 MG tablet Take 1 tablet (3.125 mg total) by mouth 2 (two) times daily.     Allergies:   Beta adrenergic blockers; Calcium channel blockers; and Naproxen   Social History   Tobacco Use  . Smoking status: Former Smoker    Packs/day: 1.25    Years: 16.00    Pack years: 20.00    Types: Cigarettes    Last attempt to quit: 03/25/1978    Years since quitting: 40.3  . Smokeless tobacco: Never Used  . Tobacco comment: quit more than 30 years ago  Substance Use Topics  . Alcohol use: No  . Drug use: No     Family Hx: The patient's family history includes Breast cancer in her mother; Diabetes in her father.  ROS:   Please see the history of present illness.     All other systems reviewed and are negative.   Prior CV studies:   The following studies were reviewed today:  TEE 09/18/17:   Aortic valve: The valve is trileaflet. Mild valve thickening present. Mild valve calcification present. No stenosis. No regurgitation.  Mitral valve:  Mild regurgitation with central jet.  Right ventricle: Normal wall thickness and ejection fraction. Cavity is mildly dilated.  Tricuspid valve: Trace regurgitation. The tricuspid valve regurgitation jet is central.  Left Atrial Appendage: Not well visualized  Pulmonic valve: No regurgitation.  Left ventricle: Estimated EF 35-40%. Severe hypokinesis of the anterior, anterolateral and inferior wall.     Labs/Other Tests and Data Reviewed:    EKG:  No ECG reviewed.  Recent Labs: 09/12/2017: TSH 1.064 09/13/2017: ALT 23 10/10/2017: Hemoglobin 10.3; Magnesium 1.3; Platelets 484 10/17/2017: BUN 17; Creatinine, Ser 1.28; Potassium 4.4; Sodium 139   Recent Lipid Panel Lab Results  Component Value Date/Time   CHOL 152 09/13/2017 04:23 AM   TRIG 209 (H) 09/13/2017 04:23 AM  HDL 35 (L) 09/13/2017 04:23 AM   CHOLHDL 4.3 09/13/2017 04:23 AM   LDLCALC 75 09/13/2017 04:23 AM    Wt Readings from Last 3 Encounters:  08/03/18 202 lb (91.6 kg)  01/28/18 197 lb (89.4 kg)  11/25/17 194 lb (88 kg)     Objective:    Vital Signs:  BP (!) 166/76   Pulse 76   Ht 5\' 5"  (1.651 m)   Wt 202 lb (91.6 kg)   BMI 33.61 kg/m    VITAL SIGNS:  reviewed  ASSESSMENT & PLAN:    1. Coronary disease status post three-vessel CABG with non-STEMI: Symptomatically stable. Currently on aspirin, Lipitor 80 mg, lisinopril, and carvedilol. She needs to adhere with a low sodium and low fat diet for better BP control and weight loss.  2. Ischemic cardiomyopathy, LVEF 35 to 40%: I will reassess cardiac structure and function at her next visit. Continue carvedilol and lisinopril. She is also on chlorthalidone 25 mg.  3. Postoperative atrial fibrillation: Symptomatically stable without documented recurrence.    4. Hypertension: Elevated today with several systolic readings at home in the 150-170 range. Several recent med adjustments by PCP.  She needs to adhere with a low sodium and low fat diet for better  BP control and weight loss.  5. Hyperlipidemia: Continue high intensity statin therapy with atorvastatin 80 mg.  6. Bilateral carotid artery disease: 1 to 39% stenosis in the right ICA and 40 to 59% stenosis in the left ICA on 09/17/2017. Continue aspirin and statin. I will monitor with surveillance Dopplers.  7. 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   COVID-19 Education: The signs and symptoms of COVID-19 were discussed with the patient and how to seek care for testing (follow up with PCP or arrange E-visit).  The importance of social distancing was discussed today.  Time:   Today, I have spent 25 minutes with the patient with telehealth technology discussing the above problems.     Medication Adjustments/Labs and Tests Ordered: Current medicines are reviewed at length with the patient today.  Concerns regarding medicines are outlined above.   Tests Ordered: No orders of the defined types were placed in this encounter.   Medication Changes: No orders of the defined types were placed in this encounter.   Disposition:  Follow up in 3 month(s)  Signed, Kate Sable, MD  08/03/2018 3:21 PM    [facility]

## 2018-08-03 NOTE — Patient Instructions (Signed)
Medication Instructions:  Your physician recommends that you continue on your current medications as directed. Please refer to the Current Medication list given to you today. Restart Taking Lipitor 80 mg Daily   Labwork: NONE  Testing/Procedures: None   Follow-Up: Your physician recommends that you schedule a follow-up appointment in: 3 Months with Dr. Bronson Ing   Any Other Special Instructions Will Be Listed Below (If Applicable).  Stop eating [place], fried chicken and stop drinking sodas    If you need a refill on your cardiac medications before your next appointment, please call your pharmacy. Thank you for choosing [facility]!

## 2018-08-28 DIAGNOSIS — E1149 Type 2 diabetes mellitus with other diabetic neurological complication: Secondary | ICD-10-CM | POA: Diagnosis not present

## 2018-08-28 DIAGNOSIS — K210 Gastro-esophageal reflux disease with esophagitis: Secondary | ICD-10-CM | POA: Diagnosis not present

## 2018-08-28 DIAGNOSIS — I10 Essential (primary) hypertension: Secondary | ICD-10-CM | POA: Diagnosis not present

## 2018-08-28 DIAGNOSIS — N182 Chronic kidney disease, stage 2 (mild): Secondary | ICD-10-CM | POA: Diagnosis not present

## 2018-10-05 DIAGNOSIS — E1149 Type 2 diabetes mellitus with other diabetic neurological complication: Secondary | ICD-10-CM | POA: Diagnosis not present

## 2018-10-05 DIAGNOSIS — K210 Gastro-esophageal reflux disease with esophagitis: Secondary | ICD-10-CM | POA: Diagnosis not present

## 2018-10-05 DIAGNOSIS — N182 Chronic kidney disease, stage 2 (mild): Secondary | ICD-10-CM | POA: Diagnosis not present

## 2018-10-05 DIAGNOSIS — I10 Essential (primary) hypertension: Secondary | ICD-10-CM | POA: Diagnosis not present

## 2018-10-20 DIAGNOSIS — I10 Essential (primary) hypertension: Secondary | ICD-10-CM | POA: Diagnosis not present

## 2018-10-20 DIAGNOSIS — E1121 Type 2 diabetes mellitus with diabetic nephropathy: Secondary | ICD-10-CM | POA: Diagnosis not present

## 2018-10-20 DIAGNOSIS — N182 Chronic kidney disease, stage 2 (mild): Secondary | ICD-10-CM | POA: Diagnosis not present

## 2018-10-20 DIAGNOSIS — E1149 Type 2 diabetes mellitus with other diabetic neurological complication: Secondary | ICD-10-CM | POA: Diagnosis not present

## 2018-10-20 DIAGNOSIS — I25728 Atherosclerosis of autologous artery coronary artery bypass graft(s) with other forms of angina pectoris: Secondary | ICD-10-CM | POA: Diagnosis not present

## 2018-10-20 DIAGNOSIS — F411 Generalized anxiety disorder: Secondary | ICD-10-CM | POA: Diagnosis not present

## 2018-10-20 DIAGNOSIS — K210 Gastro-esophageal reflux disease with esophagitis: Secondary | ICD-10-CM | POA: Diagnosis not present

## 2018-10-27 DIAGNOSIS — K210 Gastro-esophageal reflux disease with esophagitis: Secondary | ICD-10-CM | POA: Diagnosis not present

## 2018-10-27 DIAGNOSIS — E1149 Type 2 diabetes mellitus with other diabetic neurological complication: Secondary | ICD-10-CM | POA: Diagnosis not present

## 2018-10-27 DIAGNOSIS — F411 Generalized anxiety disorder: Secondary | ICD-10-CM | POA: Diagnosis not present

## 2018-10-27 DIAGNOSIS — I10 Essential (primary) hypertension: Secondary | ICD-10-CM | POA: Diagnosis not present

## 2018-10-27 DIAGNOSIS — E1121 Type 2 diabetes mellitus with diabetic nephropathy: Secondary | ICD-10-CM | POA: Diagnosis not present

## 2018-10-27 DIAGNOSIS — N182 Chronic kidney disease, stage 2 (mild): Secondary | ICD-10-CM | POA: Diagnosis not present

## 2018-10-30 DIAGNOSIS — E785 Hyperlipidemia, unspecified: Secondary | ICD-10-CM | POA: Diagnosis not present

## 2018-10-30 DIAGNOSIS — Z87891 Personal history of nicotine dependence: Secondary | ICD-10-CM | POA: Diagnosis not present

## 2018-10-30 DIAGNOSIS — M5412 Radiculopathy, cervical region: Secondary | ICD-10-CM | POA: Diagnosis not present

## 2018-10-30 DIAGNOSIS — Z7982 Long term (current) use of aspirin: Secondary | ICD-10-CM | POA: Diagnosis not present

## 2018-10-30 DIAGNOSIS — R202 Paresthesia of skin: Secondary | ICD-10-CM | POA: Diagnosis not present

## 2018-10-30 DIAGNOSIS — Z7984 Long term (current) use of oral hypoglycemic drugs: Secondary | ICD-10-CM | POA: Diagnosis not present

## 2018-10-30 DIAGNOSIS — E119 Type 2 diabetes mellitus without complications: Secondary | ICD-10-CM | POA: Diagnosis not present

## 2018-10-30 DIAGNOSIS — Z79899 Other long term (current) drug therapy: Secondary | ICD-10-CM | POA: Diagnosis not present

## 2018-10-30 DIAGNOSIS — E7800 Pure hypercholesterolemia, unspecified: Secondary | ICD-10-CM | POA: Diagnosis not present

## 2018-10-30 DIAGNOSIS — I10 Essential (primary) hypertension: Secondary | ICD-10-CM | POA: Diagnosis not present

## 2018-10-30 DIAGNOSIS — M542 Cervicalgia: Secondary | ICD-10-CM | POA: Diagnosis not present

## 2018-10-30 DIAGNOSIS — K219 Gastro-esophageal reflux disease without esophagitis: Secondary | ICD-10-CM | POA: Diagnosis not present

## 2018-11-03 ENCOUNTER — Telehealth: Payer: Self-pay | Admitting: Cardiovascular Disease

## 2018-11-03 NOTE — Telephone Encounter (Signed)
Virtual Visit Pre-Appointment Phone Call  "(Name), I am calling you today to discuss your upcoming appointment. We are currently trying to limit exposure to the virus that causes COVID-19 by seeing patients at home rather than in the office."  1. "What is the BEST phone number to call the day of the visit?" - include this in appointment notes  2. Do you have or have access to (through a family member/friend) a smartphone with video capability that we can use for your visit?" a. If yes - list this number in appt notes as cell (if different from BEST phone #) and list the appointment type as a VIDEO visit in appointment notes b. If no - list the appointment type as a PHONE visit in appointment notes  3. Confirm consent - "In the setting of the current Covid19 crisis, you are scheduled for a (phone or video) visit with your provider on (date) at (time).  Just as we do with many in-office visits, in order for you to participate in this visit, we must obtain consent.  If you'd like, I can send this to your mychart (if signed up) or email for you to review.  Otherwise, I can obtain your verbal consent now.  All virtual visits are billed to your insurance company just like a normal visit would be.  By agreeing to a virtual visit, we'd like you to understand that the technology does not allow for your provider to perform an examination, and thus may limit your provider's ability to fully assess your condition. If your provider identifies any concerns that need to be evaluated in person, we will make arrangements to do so.  Finally, though the technology is pretty good, we cannot assure that it will always work on either your or our end, and in the setting of a video visit, we may have to convert it to a phone-only visit.  In either situation, we cannot ensure that we have a secure connection.  Are you willing to proceed?" STAFF: Did the patient verbally acknowledge consent to telehealth visit? Document  YES/NO here: yes  4. Advise patient to be prepared - "Two hours prior to your appointment, go ahead and check your blood pressure, pulse, oxygen saturation, and your weight (if you have the equipment to check those) and write them all down. When your visit starts, your provider will ask you for this information. If you have an Apple Watch or Kardia device, please plan to have heart rate information ready on the day of your appointment. Please have a pen and paper handy nearby the day of the visit as well."  5. Give patient instructions for MyChart download to smartphone OR Doximity/Doxy.me as below if video visit (depending on what platform provider is using)  6. Inform patient they will receive a phone call 15 minutes prior to their appointment time (may be from unknown caller ID) so they should be prepared to answer    TELEPHONE CALL NOTE  [place] TRAUGHBER has been deemed a candidate for a follow-up tele-health visit to limit community exposure during the Covid-19 pandemic. I spoke with the patient via phone to ensure availability of phone/video source, confirm preferred email & phone number, and discuss instructions and expectations.  I reminded Karen Tate to be prepared with any vital sign and/or heart rhythm information that could potentially be obtained via home monitoring, at the time of her visit. I reminded CHARI PARMENTER to expect a phone call prior  to her visit.  Weston Anna 11/03/2018 3:49 PM   INSTRUCTIONS FOR DOWNLOADING THE MYCHART APP TO SMARTPHONE  - The patient must first make sure to have activated MyChart and know their login information - If Apple, go to [employer] and type in MyChart in the search bar and download the app. If Android, ask patient to go to [employer] and type in [place] in the search bar and download the app. The app is free but as with any other app downloads, their phone may require them to verify saved payment information or Apple/Android  password.  - The patient will need to then log into the app with their MyChart username and password, and select [facility] as their healthcare provider to link the account. When it is time for your visit, go to the MyChart app, find appointments, and click Begin Video Visit. Be sure to Select Allow for your device to access the Microphone and Camera for your visit. You will then be connected, and your provider will be with you shortly.  **If they have any issues connecting, or need assistance please contact MyChart service desk [phone])**  **If using a computer, in order to ensure the best quality for their visit they will need to use either of the following Internet Browsers: [employer], or Google Chrome**  IF USING DOXIMITY or DOXY.ME - The patient will receive a link just prior to their visit by text.     FULL LENGTH CONSENT FOR TELE-HEALTH VISIT   I hereby voluntarily request, consent and authorize [facility] and its employed or contracted physicians, physician assistants, nurse practitioners or other licensed health care professionals (the Practitioner), to provide me with telemedicine health care services (the Services") as deemed necessary by the treating Practitioner. I acknowledge and consent to receive the Services by the Practitioner via telemedicine. I understand that the telemedicine visit will involve communicating with the Practitioner through live audiovisual communication technology and the disclosure of certain medical information by electronic transmission. I acknowledge that I have been given the opportunity to request an in-person assessment or other available alternative prior to the telemedicine visit and am voluntarily participating in the telemedicine visit.  I understand that I have the right to withhold or withdraw my consent to the use of telemedicine in the course of my care at any time, without affecting my right to future care or treatment,  and that the Practitioner or I may terminate the telemedicine visit at any time. I understand that I have the right to inspect all information obtained and/or recorded in the course of the telemedicine visit and may receive copies of available information for a reasonable fee.  I understand that some of the potential risks of receiving the Services via telemedicine include:   Delay or interruption in medical evaluation due to technological equipment failure or disruption;  Information transmitted may not be sufficient (e.g. poor resolution of images) to allow for appropriate medical decision making by the Practitioner; and/or   In rare instances, security protocols could fail, causing a breach of personal health information.  Furthermore, I acknowledge that it is my responsibility to provide information about my medical history, conditions and care that is complete and accurate to the best of my ability. I acknowledge that Practitioner's advice, recommendations, and/or decision may be based on factors not within their control, such as incomplete or inaccurate data provided by me or distortions of diagnostic images or specimens that may result from electronic transmissions.  I understand that the practice of medicine is not an exact science and that Practitioner makes no warranties or guarantees regarding treatment outcomes. I acknowledge that I will receive a copy of this consent concurrently upon execution via email to the email address I last provided but may also request a printed copy by calling the office of [facility].    I understand that my insurance will be billed for this visit.   I have read or had this consent read to me.  I understand the contents of this consent, which adequately explains the benefits and risks of the Services being provided via telemedicine.   I have been provided ample opportunity to ask questions regarding this consent and the Services and have had my questions  answered to my satisfaction.  I give my informed consent for the services to be provided through the use of telemedicine in my medical care  By participating in this telemedicine visit I agree to the above.

## 2018-11-10 ENCOUNTER — Encounter: Payer: Self-pay | Admitting: Cardiovascular Disease

## 2018-11-10 ENCOUNTER — Telehealth (INDEPENDENT_AMBULATORY_CARE_PROVIDER_SITE_OTHER): Payer: Medicare Other | Admitting: Cardiovascular Disease

## 2018-11-10 VITALS — BP 155/66 | HR 74 | Ht 65.0 in | Wt 199.0 lb

## 2018-11-10 DIAGNOSIS — I255 Ischemic cardiomyopathy: Secondary | ICD-10-CM

## 2018-11-10 DIAGNOSIS — E785 Hyperlipidemia, unspecified: Secondary | ICD-10-CM

## 2018-11-10 DIAGNOSIS — Z951 Presence of aortocoronary bypass graft: Secondary | ICD-10-CM

## 2018-11-10 DIAGNOSIS — I6523 Occlusion and stenosis of bilateral carotid arteries: Secondary | ICD-10-CM

## 2018-11-10 DIAGNOSIS — I10 Essential (primary) hypertension: Secondary | ICD-10-CM

## 2018-11-10 DIAGNOSIS — I25708 Atherosclerosis of coronary artery bypass graft(s), unspecified, with other forms of angina pectoris: Secondary | ICD-10-CM

## 2018-11-10 DIAGNOSIS — I4891 Unspecified atrial fibrillation: Secondary | ICD-10-CM

## 2018-11-10 DIAGNOSIS — Z86711 Personal history of pulmonary embolism: Secondary | ICD-10-CM

## 2018-11-10 NOTE — Progress Notes (Signed)
Virtual Visit via Telephone Note   This visit type was conducted due to national recommendations for restrictions regarding the COVID-19 Pandemic (e.g. social distancing) in an effort to limit this patient's exposure and mitigate transmission in our community.  Due to her co-morbid illnesses, this patient is at least at moderate risk for complications without adequate follow up.  This format is felt to be most appropriate for this patient at this time.  The patient did not have access to video technology/had technical difficulties with video requiring transitioning to audio format only (telephone).  All issues noted in this document were discussed and addressed.  No physical exam could be performed with this format.  Please refer to the patient's chart for her  consent to telehealth for [facility].   Date:  11/10/2018   ID:  Karen Tate, DOB [birth year], MRN [id number]  Patient Location: Home Provider Location: Office  PCP:  Neale Burly, MD  Cardiologist:  Kate Sable, MD  Electrophysiologist:  None   Evaluation Performed:  Follow-Up Visit  Chief Complaint:  CAD  History of Present Illness:    Karen Tate is a 73 y.o. female with  CAD. She underwent three-vessel CABG on 09/18/2017 (left internal mammary artery to left anterior descending, sequential saphenous vein graft to ramus intermedius branches 1 and 2). She has a history of ischemic cardiomyopathy and DVT/PE, as well as postoperative atrial fibrillation.  She has been having some problems with dizziness and meclizine has not helped.  She has been checking her BP daily with overall good control with some isolated SBP's if 150-180, the latter in July.  She denies chest pain. She seldom has palpitations. She has been trying to avoid fried foods. She has a history of back surgery which has led to some exertional fatigue since then.  She went to the ED last week for right wrist pain and hand swelling.  The patient  does not have symptoms concerning for COVID-19 infection (fever, chills, cough, or new shortness of breath).    Past Medical History:  Diagnosis Date  . Anxiety neurosis   . Arthritis   . CAD (coronary artery disease)    a. 08/2017: abnormal nuc-> sent directly to [facility], NSTEMI, s/p CABG (left internal mammary artery to left anterior descending, sequential saphenous vein graft to ramus intermedius branches 1 and 2).  . Carotid artery disease ([place])   . Chronic anemia   . Dizziness   . GERD (gastroesophageal reflux disease)   . Glaucoma   . Hiatal hernia   . Hyperlipemia   . Hypertension   . Ischemic cardiomyopathy   . Joint pain   . Postoperative atrial fibrillation ([place]) 08/2017  . Pulmonary embolus ([place])   . Type 2 diabetes mellitus ([place])    Past Surgical History:  Procedure Laterality Date  . CORONARY ARTERY BYPASS GRAFT N/A 09/18/2017   Procedure: CORONARY ARTERY BYPASS GRAFTING (CABG);  Surgeon: [name], MD;  Location: [facility];  Service: Open Heart Surgery;  Laterality: N/A;  Saphenous vein harvest. LIMA to LAD Saphenous vein (sequential) ramus 1 & 2  . HERNIA REPAIR    . LEFT HEART CATH AND CORONARY ANGIOGRAPHY N/A 09/15/2017   Procedure: LEFT HEART CATH AND CORONARY ANGIOGRAPHY;  Surgeon: Lorretta Harp, MD;  Location: [facility];  Service: Cardiovascular;  Laterality: N/A;  . SHOULDER SURGERY     LEFT  . TEE WITHOUT CARDIOVERSION N/A 09/18/2017   Procedure: TRANSESOPHAGEAL ECHOCARDIOGRAM (TEE);  Surgeon:  [name], MD;  Location: [facility];  Service: Open Heart Surgery;  Laterality: N/A;  . TOTAL KNEE ARTHROPLASTY     LEFT  . VAGINAL HYSTERECTOMY       Current Meds  Medication Sig  . ALPRAZolam (XANAX) 0.5 MG tablet Take 0.5 mg by mouth 2 (two) times daily.  Marland Kitchen amLODipine (NORVASC) 10 MG tablet Take 10 mg by mouth daily.  Marland Kitchen aspirin 81 MG tablet Take 1 tablet (81 mg total) by mouth daily.  Marland Kitchen atorvastatin (LIPITOR) 80 MG tablet Take 1 tablet  (80 mg total) by mouth daily at 6 PM.  . carvedilol (COREG) 25 MG tablet Take 25 mg by mouth 2 (two) times daily with a meal.  . chlorthalidone (HYGROTON) 25 MG tablet   . fish oil-omega-3 fatty acids 1000 MG capsule Take 1 g by mouth 3 (three) times daily.   Marland Kitchen glipiZIDE (GLUCOTROL) 5 MG tablet Take 5 mg by mouth 2 (two) times daily before a meal.  . lisinopril (ZESTRIL) 40 MG tablet   . magnesium oxide (MAG-OX) 400 MG tablet Take 1 tablet (400 mg total) by mouth 2 (two) times daily.  . meclizine (ANTIVERT) 25 MG tablet Take 25 mg by mouth 3 (three) times daily as needed for dizziness.  . metFORMIN (GLUCOPHAGE) 1000 MG tablet Take 1,000 mg by mouth 2 (two) times daily with a meal.  . Multiple Vitamins-Minerals (CENTRUM SILVER 50+WOMEN) TABS Take by mouth.  Marland Kitchen omeprazole (PRILOSEC) 40 MG capsule Take 40 mg by mouth daily.  Marland Kitchen tiZANidine (ZANAFLEX) 4 MG tablet Take 1 tablet (4 mg total) by mouth every 6 (six) hours as needed for muscle spasms.  . [DISCONTINUED] carvedilol (COREG) 12.5 MG tablet 12.5 mg 2 (two) times daily with a meal.      Allergies:   Beta adrenergic blockers, Calcium channel blockers, and Naproxen   Social History   Tobacco Use  . Smoking status: Former Smoker    Packs/day: 1.25    Years: 16.00    Pack years: 20.00    Types: Cigarettes    Quit date: 03/25/1978    Years since quitting: 40.6  . Smokeless tobacco: Never Used  . Tobacco comment: quit more than 30 years ago  Substance Use Topics  . Alcohol use: No  . Drug use: No     Family Hx: The patient's family history includes Breast cancer in her mother; Diabetes in her father.  ROS:   Please see the history of present illness.     All other systems reviewed and are negative.   Prior CV studies:   The following studies were reviewed today:  TEE 09/18/17:   Aortic valve: The valve is trileaflet. Mild valve thickening present. Mild valve calcification present. No stenosis. No regurgitation.  Mitral  valve: Mild regurgitation with central jet.  Right ventricle: Normal wall thickness and ejection fraction. Cavity is mildly dilated.  Tricuspid valve: Trace regurgitation. The tricuspid valve regurgitation jet is central.  Left Atrial Appendage: Not well visualized  Pulmonic valve: No regurgitation.  Left ventricle: Estimated EF 35-40%. Severe hypokinesis of the anterior, anterolateral and inferior wall.  Labs/Other Tests and Data Reviewed:    EKG:  No ECG reviewed.  Recent Labs: No results found for requested labs within last 8760 hours.   Recent Lipid Panel Lab Results  Component Value Date/Time   CHOL 152 09/13/2017 04:23 AM   TRIG 209 (H) 09/13/2017 04:23 AM   HDL 35 (L) 09/13/2017 04:23 AM   CHOLHDL 4.3  09/13/2017 04:23 AM   LDLCALC 75 09/13/2017 04:23 AM    Wt Readings from Last 3 Encounters:  11/10/18 199 lb (90.3 kg)  08/03/18 202 lb (91.6 kg)  01/28/18 197 lb (89.4 kg)     Objective:    Vital Signs:  BP (!) 155/66   Pulse 74   Ht 5\' 5"  (1.651 m)   Wt 199 lb (90.3 kg)   SpO2 98%   BMI 33.12 kg/m    VITAL SIGNS:  reviewed  ASSESSMENT & PLAN:    1. Coronary disease status post three-vessel CABG with non-STEMI: Symptomatically stable. Currently on aspirin, Lipitor 80 mg, lisinopril, and carvedilol. I will obtain an echocardiogram to reevaluate cardiac structure and function.  2. Ischemic cardiomyopathy, LVEF 35 to 40%:  I will obtain an echocardiogram to reevaluate cardiac structure and function. Continue carvedilol and lisinopril. She isalsoon chlorthalidone 25 mg.  3. Postoperative atrial fibrillation: Symptomatically stablewithout documented recurrence.   4. Hypertension:Elevated today. Carvedilol just increased by PCP on 11/06/18.  5. Hyperlipidemia: Continue high intensity statin therapy with atorvastatin 80 mg.  6. Bilateral carotid artery disease: 1 to 39% stenosis in the right ICA and 40 to 59% stenosis in the left ICA on  09/17/2017. Continue aspirin and statin. I will monitor with surveillance Dopplers.  7. 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  8. Dizziness: She may need to see an ENT specialist.    COVID-19 Education: The signs and symptoms of COVID-19 were discussed with the patient and how to seek care for testing (follow up with PCP or arrange E-visit).  The importance of social distancing was discussed today.  Time:   Today, I have spent 15 minutes with the patient with telehealth technology discussing the above problems.     Medication Adjustments/Labs and Tests Ordered: Current medicines are reviewed at length with the patient today.  Concerns regarding medicines are outlined above.   Tests Ordered: No orders of the defined types were placed in this encounter.   Medication Changes: No orders of the defined types were placed in this encounter.   Follow Up:  In Person in 4 month(s)  Signed, Kate Sable, MD  11/10/2018 11:11 AM    [place]

## 2018-11-10 NOTE — Patient Instructions (Signed)
Your physician recommends that you schedule a follow-up appointment in: [place]  Your physician recommends that you continue on your current medications as directed. Please refer to the Current Medication list given to you today.  Your physician has requested that you have an echocardiogram. Echocardiography is a painless test that uses sound waves to create images of your heart. It provides your doctor with information about the size and shape of your heart and how well your heart's chambers and valves are working. This procedure takes approximately one hour. There are no restrictions for this procedure.  Thank you for choosing [facility]!!

## 2018-11-10 NOTE — Addendum Note (Signed)
Addended by: Julian Hy T on: 11/10/2018 11:48 AM   Modules accepted: Orders

## 2018-11-18 ENCOUNTER — Ambulatory Visit (INDEPENDENT_AMBULATORY_CARE_PROVIDER_SITE_OTHER): Payer: Medicare Other

## 2018-11-18 ENCOUNTER — Other Ambulatory Visit: Payer: Self-pay

## 2018-11-18 DIAGNOSIS — I255 Ischemic cardiomyopathy: Secondary | ICD-10-CM | POA: Diagnosis not present

## 2018-11-20 ENCOUNTER — Telehealth: Payer: Self-pay | Admitting: *Deleted

## 2018-11-20 NOTE — Telephone Encounter (Signed)
Notes recorded by Laurine Blazer, LPN on 0/05/4915 at 9:15 AM EDT  .Patient notified. Copy to pmd. Follow up scheduled for December with Dr. Bronson Ing.    ------   Notes recorded by Herminio Commons, MD on 11/18/2018 at 12:30 PM EDT  LVEF has normalized. There is moderate mitral valve leakage.

## 2018-12-02 ENCOUNTER — Other Ambulatory Visit: Payer: Medicare Other

## 2018-12-04 DIAGNOSIS — E1149 Type 2 diabetes mellitus with other diabetic neurological complication: Secondary | ICD-10-CM | POA: Diagnosis not present

## 2018-12-04 DIAGNOSIS — E1121 Type 2 diabetes mellitus with diabetic nephropathy: Secondary | ICD-10-CM | POA: Diagnosis not present

## 2018-12-04 DIAGNOSIS — N182 Chronic kidney disease, stage 2 (mild): Secondary | ICD-10-CM | POA: Diagnosis not present

## 2018-12-04 DIAGNOSIS — F411 Generalized anxiety disorder: Secondary | ICD-10-CM | POA: Diagnosis not present

## 2018-12-04 DIAGNOSIS — I10 Essential (primary) hypertension: Secondary | ICD-10-CM | POA: Diagnosis not present

## 2018-12-04 DIAGNOSIS — K210 Gastro-esophageal reflux disease with esophagitis: Secondary | ICD-10-CM | POA: Diagnosis not present

## 2018-12-15 DIAGNOSIS — Z23 Encounter for immunization: Secondary | ICD-10-CM | POA: Diagnosis not present

## 2018-12-17 DIAGNOSIS — F411 Generalized anxiety disorder: Secondary | ICD-10-CM | POA: Diagnosis not present

## 2018-12-17 DIAGNOSIS — E1121 Type 2 diabetes mellitus with diabetic nephropathy: Secondary | ICD-10-CM | POA: Diagnosis not present

## 2018-12-17 DIAGNOSIS — E1149 Type 2 diabetes mellitus with other diabetic neurological complication: Secondary | ICD-10-CM | POA: Diagnosis not present

## 2018-12-17 DIAGNOSIS — N182 Chronic kidney disease, stage 2 (mild): Secondary | ICD-10-CM | POA: Diagnosis not present

## 2018-12-17 DIAGNOSIS — I25728 Atherosclerosis of autologous artery coronary artery bypass graft(s) with other forms of angina pectoris: Secondary | ICD-10-CM | POA: Diagnosis not present

## 2018-12-17 DIAGNOSIS — K210 Gastro-esophageal reflux disease with esophagitis: Secondary | ICD-10-CM | POA: Diagnosis not present

## 2018-12-17 DIAGNOSIS — I10 Essential (primary) hypertension: Secondary | ICD-10-CM | POA: Diagnosis not present

## 2018-12-20 DIAGNOSIS — K449 Diaphragmatic hernia without obstruction or gangrene: Secondary | ICD-10-CM | POA: Diagnosis not present

## 2018-12-20 DIAGNOSIS — E785 Hyperlipidemia, unspecified: Secondary | ICD-10-CM | POA: Diagnosis not present

## 2018-12-20 DIAGNOSIS — D62 Acute posthemorrhagic anemia: Secondary | ICD-10-CM | POA: Diagnosis not present

## 2018-12-20 DIAGNOSIS — D649 Anemia, unspecified: Secondary | ICD-10-CM | POA: Diagnosis not present

## 2018-12-20 DIAGNOSIS — I10 Essential (primary) hypertension: Secondary | ICD-10-CM | POA: Diagnosis not present

## 2018-12-20 DIAGNOSIS — R0602 Shortness of breath: Secondary | ICD-10-CM | POA: Diagnosis not present

## 2018-12-20 DIAGNOSIS — E119 Type 2 diabetes mellitus without complications: Secondary | ICD-10-CM | POA: Diagnosis not present

## 2018-12-20 DIAGNOSIS — K922 Gastrointestinal hemorrhage, unspecified: Secondary | ICD-10-CM | POA: Diagnosis not present

## 2018-12-21 ENCOUNTER — Ambulatory Visit (INDEPENDENT_AMBULATORY_CARE_PROVIDER_SITE_OTHER): Payer: Medicare Other | Admitting: Otolaryngology

## 2018-12-21 DIAGNOSIS — E782 Mixed hyperlipidemia: Secondary | ICD-10-CM | POA: Diagnosis not present

## 2018-12-21 DIAGNOSIS — K449 Diaphragmatic hernia without obstruction or gangrene: Secondary | ICD-10-CM | POA: Diagnosis present

## 2018-12-21 DIAGNOSIS — D500 Iron deficiency anemia secondary to blood loss (chronic): Secondary | ICD-10-CM | POA: Diagnosis not present

## 2018-12-21 DIAGNOSIS — D62 Acute posthemorrhagic anemia: Secondary | ICD-10-CM | POA: Diagnosis present

## 2018-12-21 DIAGNOSIS — I10 Essential (primary) hypertension: Secondary | ICD-10-CM | POA: Diagnosis not present

## 2018-12-21 DIAGNOSIS — K922 Gastrointestinal hemorrhage, unspecified: Secondary | ICD-10-CM | POA: Diagnosis not present

## 2018-12-21 DIAGNOSIS — Z87891 Personal history of nicotine dependence: Secondary | ICD-10-CM | POA: Diagnosis not present

## 2018-12-21 DIAGNOSIS — Z7984 Long term (current) use of oral hypoglycemic drugs: Secondary | ICD-10-CM | POA: Diagnosis not present

## 2018-12-21 DIAGNOSIS — I252 Old myocardial infarction: Secondary | ICD-10-CM | POA: Diagnosis not present

## 2018-12-21 DIAGNOSIS — F411 Generalized anxiety disorder: Secondary | ICD-10-CM | POA: Diagnosis present

## 2018-12-21 DIAGNOSIS — Z7982 Long term (current) use of aspirin: Secondary | ICD-10-CM | POA: Diagnosis not present

## 2018-12-21 DIAGNOSIS — E119 Type 2 diabetes mellitus without complications: Secondary | ICD-10-CM | POA: Diagnosis not present

## 2018-12-21 DIAGNOSIS — K219 Gastro-esophageal reflux disease without esophagitis: Secondary | ICD-10-CM | POA: Diagnosis present

## 2018-12-21 DIAGNOSIS — E785 Hyperlipidemia, unspecified: Secondary | ICD-10-CM | POA: Diagnosis present

## 2018-12-21 DIAGNOSIS — Z86711 Personal history of pulmonary embolism: Secondary | ICD-10-CM | POA: Diagnosis not present

## 2018-12-29 DIAGNOSIS — R0602 Shortness of breath: Secondary | ICD-10-CM | POA: Diagnosis not present

## 2018-12-29 DIAGNOSIS — N182 Chronic kidney disease, stage 2 (mild): Secondary | ICD-10-CM | POA: Diagnosis not present

## 2018-12-29 DIAGNOSIS — E1149 Type 2 diabetes mellitus with other diabetic neurological complication: Secondary | ICD-10-CM | POA: Diagnosis not present

## 2018-12-29 DIAGNOSIS — R0609 Other forms of dyspnea: Secondary | ICD-10-CM | POA: Diagnosis not present

## 2018-12-29 DIAGNOSIS — D508 Other iron deficiency anemias: Secondary | ICD-10-CM | POA: Diagnosis not present

## 2018-12-29 DIAGNOSIS — I10 Essential (primary) hypertension: Secondary | ICD-10-CM | POA: Diagnosis not present

## 2018-12-30 DIAGNOSIS — D500 Iron deficiency anemia secondary to blood loss (chronic): Secondary | ICD-10-CM | POA: Diagnosis not present

## 2018-12-30 DIAGNOSIS — J439 Emphysema, unspecified: Secondary | ICD-10-CM | POA: Diagnosis not present

## 2018-12-30 DIAGNOSIS — R918 Other nonspecific abnormal finding of lung field: Secondary | ICD-10-CM | POA: Diagnosis not present

## 2018-12-30 DIAGNOSIS — I700 Atherosclerosis of aorta: Secondary | ICD-10-CM | POA: Diagnosis not present

## 2018-12-30 DIAGNOSIS — R0602 Shortness of breath: Secondary | ICD-10-CM | POA: Diagnosis not present

## 2018-12-30 DIAGNOSIS — D509 Iron deficiency anemia, unspecified: Secondary | ICD-10-CM | POA: Insufficient documentation

## 2018-12-31 DIAGNOSIS — D649 Anemia, unspecified: Secondary | ICD-10-CM | POA: Diagnosis not present

## 2019-01-01 DIAGNOSIS — D649 Anemia, unspecified: Secondary | ICD-10-CM | POA: Diagnosis not present

## 2019-01-02 IMAGING — CR DG CHEST 2V
2 series · 2 of 2 positions shown · non-contrast
Comparison: Chest x-ray of September 23, 2017

CLINICAL DATA: Status post CABG on September 18, 2017. Patient ports
mile weakness today.

EXAM:
CHEST - 2 VIEW

[w chest pa]
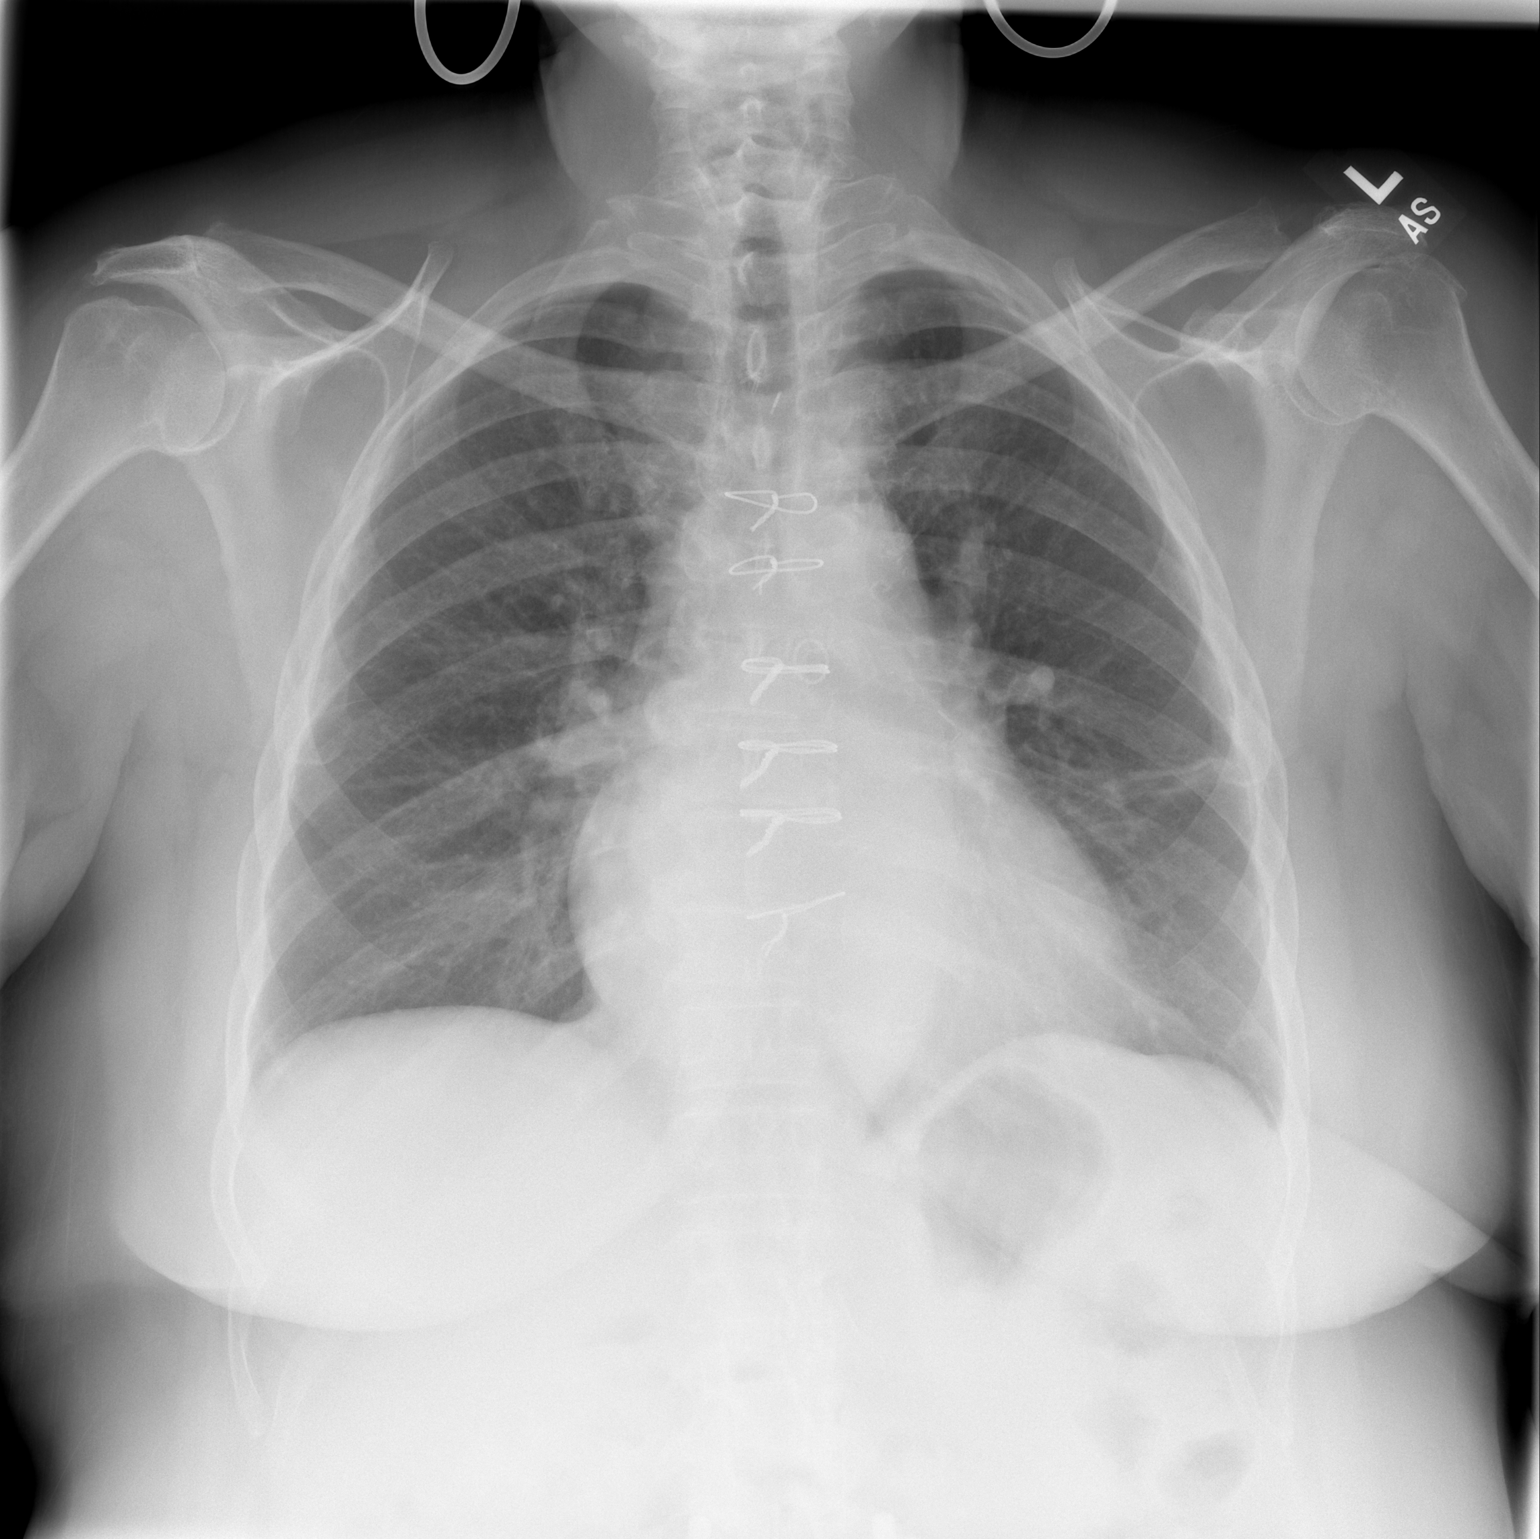

[w chest lat]
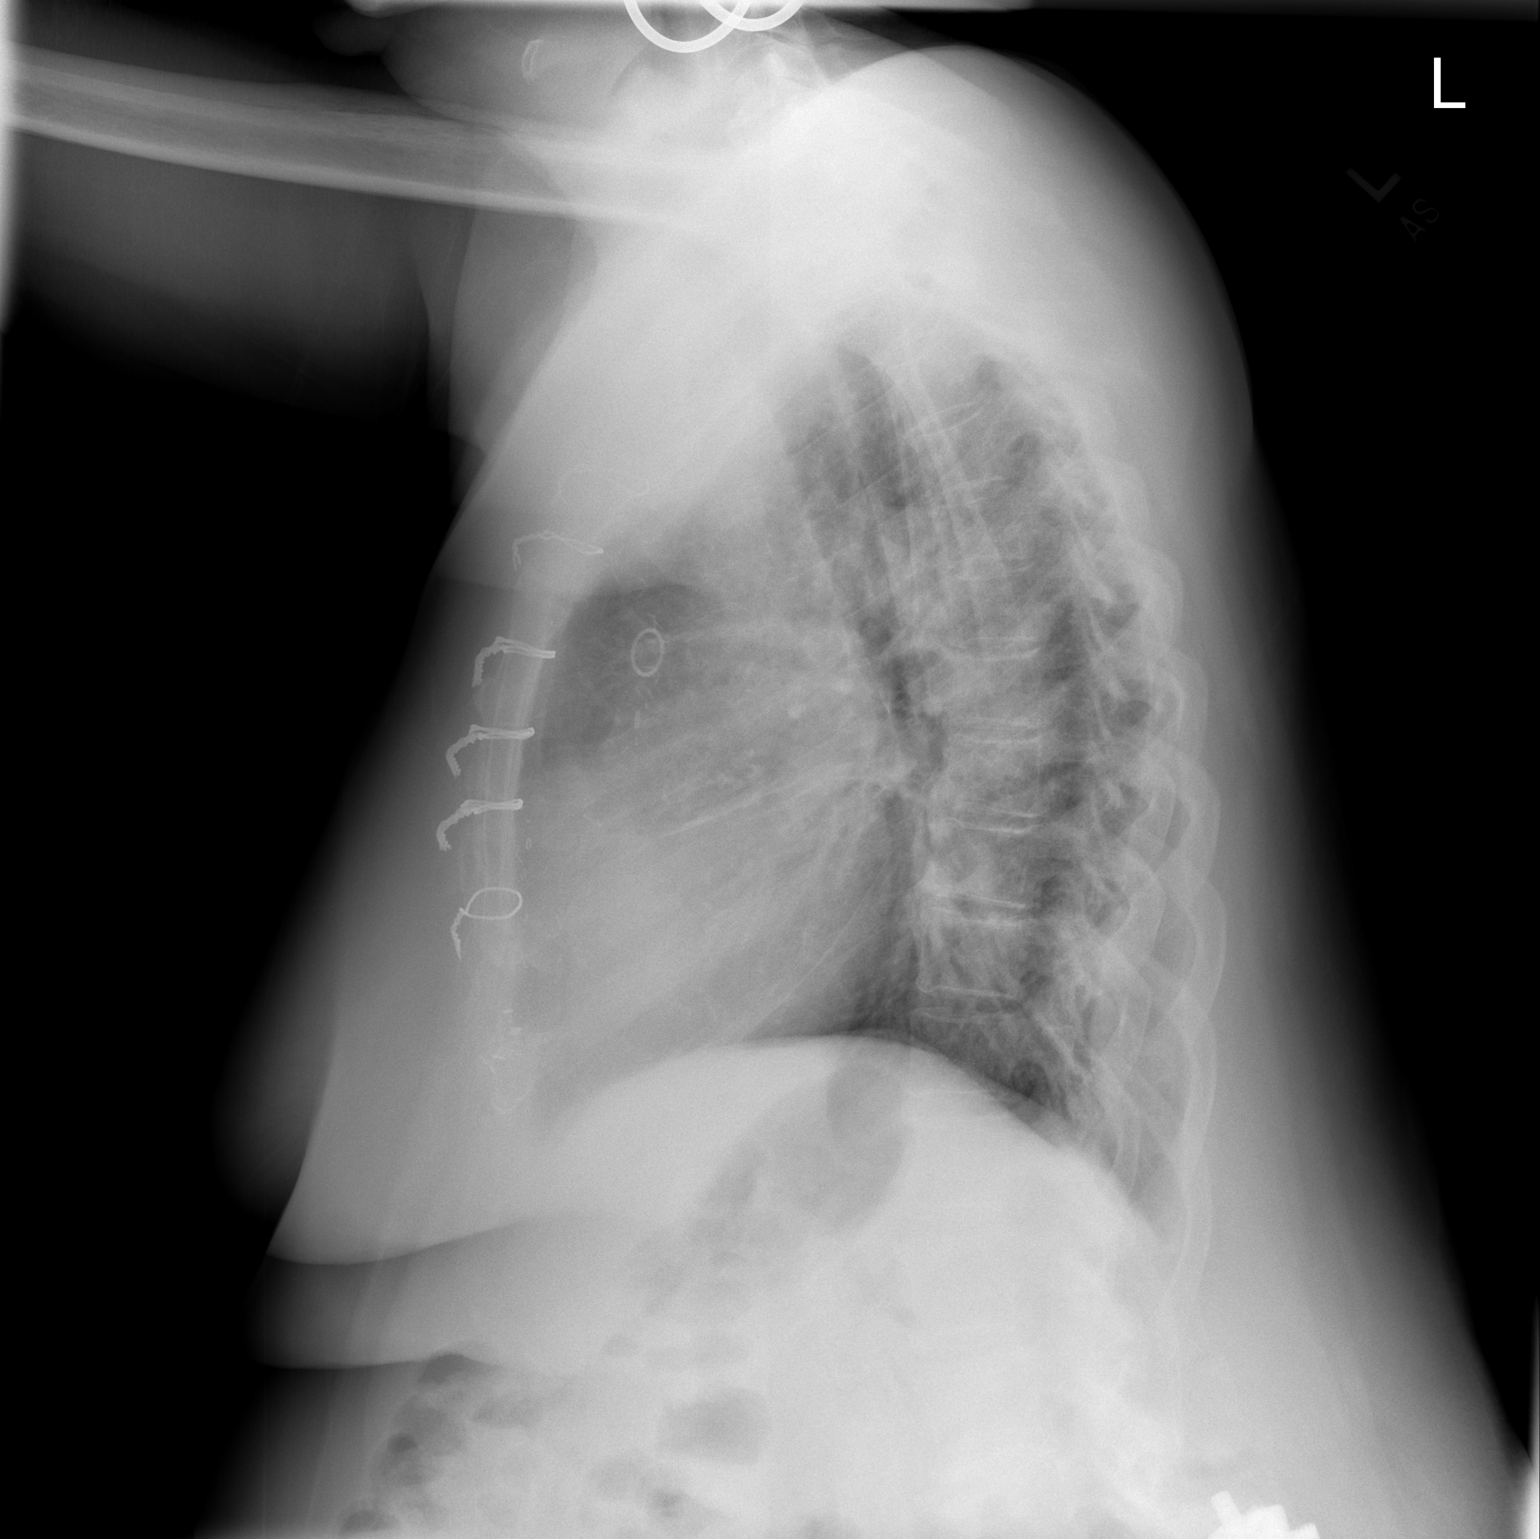

[2 of 2 positions shown; findings below may reference images not displayed]

FINDINGS: The lungs are adequately inflated. There is no focal infiltrate. The
previously demonstrated small pleural effusions have resolved. There
is persistent linear increased density in the periphery of the left
mid to lower lung which is less conspicuous today. The heart and
pulmonary vascularity are normal. The sternal wires are intact.
There is calcification in the wall of the aortic arch. The bony
thorax exhibits no acute abnormality.
IMPRESSION: There is no pneumonia nor pulmonary edema. Interval resolution of
bilateral pleural effusions. Persistent linear scarring versus
subsegmental atelectasis peripherally in the left mid to lower lung.

## 2019-01-05 DIAGNOSIS — Z01818 Encounter for other preprocedural examination: Secondary | ICD-10-CM | POA: Diagnosis not present

## 2019-01-07 DIAGNOSIS — K449 Diaphragmatic hernia without obstruction or gangrene: Secondary | ICD-10-CM | POA: Diagnosis not present

## 2019-01-07 DIAGNOSIS — K2950 Unspecified chronic gastritis without bleeding: Secondary | ICD-10-CM | POA: Diagnosis not present

## 2019-01-07 DIAGNOSIS — K2970 Gastritis, unspecified, without bleeding: Secondary | ICD-10-CM | POA: Diagnosis not present

## 2019-01-07 DIAGNOSIS — Z951 Presence of aortocoronary bypass graft: Secondary | ICD-10-CM | POA: Diagnosis not present

## 2019-01-07 DIAGNOSIS — Z90710 Acquired absence of both cervix and uterus: Secondary | ICD-10-CM | POA: Diagnosis not present

## 2019-01-07 DIAGNOSIS — K644 Residual hemorrhoidal skin tags: Secondary | ICD-10-CM | POA: Diagnosis not present

## 2019-01-07 DIAGNOSIS — Z886 Allergy status to analgesic agent status: Secondary | ICD-10-CM | POA: Diagnosis not present

## 2019-01-07 DIAGNOSIS — I2510 Atherosclerotic heart disease of native coronary artery without angina pectoris: Secondary | ICD-10-CM | POA: Diagnosis not present

## 2019-01-07 DIAGNOSIS — D509 Iron deficiency anemia, unspecified: Secondary | ICD-10-CM | POA: Diagnosis not present

## 2019-01-07 DIAGNOSIS — K641 Second degree hemorrhoids: Secondary | ICD-10-CM | POA: Diagnosis not present

## 2019-01-07 DIAGNOSIS — Z7984 Long term (current) use of oral hypoglycemic drugs: Secondary | ICD-10-CM | POA: Diagnosis not present

## 2019-01-07 DIAGNOSIS — E119 Type 2 diabetes mellitus without complications: Secondary | ICD-10-CM | POA: Diagnosis not present

## 2019-01-07 DIAGNOSIS — K219 Gastro-esophageal reflux disease without esophagitis: Secondary | ICD-10-CM | POA: Diagnosis not present

## 2019-01-07 DIAGNOSIS — Z7982 Long term (current) use of aspirin: Secondary | ICD-10-CM | POA: Diagnosis not present

## 2019-01-07 DIAGNOSIS — Z79899 Other long term (current) drug therapy: Secondary | ICD-10-CM | POA: Diagnosis not present

## 2019-01-07 DIAGNOSIS — I10 Essential (primary) hypertension: Secondary | ICD-10-CM | POA: Diagnosis not present

## 2019-01-21 DIAGNOSIS — N182 Chronic kidney disease, stage 2 (mild): Secondary | ICD-10-CM | POA: Diagnosis not present

## 2019-01-21 DIAGNOSIS — Z0000 Encounter for general adult medical examination without abnormal findings: Secondary | ICD-10-CM | POA: Diagnosis not present

## 2019-01-21 DIAGNOSIS — E1121 Type 2 diabetes mellitus with diabetic nephropathy: Secondary | ICD-10-CM | POA: Diagnosis not present

## 2019-01-21 DIAGNOSIS — I10 Essential (primary) hypertension: Secondary | ICD-10-CM | POA: Diagnosis not present

## 2019-01-21 DIAGNOSIS — Z1389 Encounter for screening for other disorder: Secondary | ICD-10-CM | POA: Diagnosis not present

## 2019-01-21 DIAGNOSIS — K219 Gastro-esophageal reflux disease without esophagitis: Secondary | ICD-10-CM | POA: Diagnosis not present

## 2019-01-25 DIAGNOSIS — E1143 Type 2 diabetes mellitus with diabetic autonomic (poly)neuropathy: Secondary | ICD-10-CM | POA: Diagnosis not present

## 2019-01-29 DIAGNOSIS — E1143 Type 2 diabetes mellitus with diabetic autonomic (poly)neuropathy: Secondary | ICD-10-CM | POA: Diagnosis not present

## 2019-01-29 DIAGNOSIS — I10 Essential (primary) hypertension: Secondary | ICD-10-CM | POA: Diagnosis not present

## 2019-02-16 ENCOUNTER — Other Ambulatory Visit: Payer: Self-pay | Admitting: *Deleted

## 2019-02-16 DIAGNOSIS — Z20828 Contact with and (suspected) exposure to other viral communicable diseases: Secondary | ICD-10-CM | POA: Diagnosis not present

## 2019-02-16 DIAGNOSIS — Z20822 Contact with and (suspected) exposure to covid-19: Secondary | ICD-10-CM

## 2019-02-17 ENCOUNTER — Other Ambulatory Visit: Payer: Self-pay

## 2019-02-17 LAB — NOVEL CORONAVIRUS, NAA: SARS-CoV-2, NAA: DETECTED — AB

## 2019-03-02 DIAGNOSIS — E1143 Type 2 diabetes mellitus with diabetic autonomic (poly)neuropathy: Secondary | ICD-10-CM | POA: Diagnosis not present

## 2019-03-02 DIAGNOSIS — I10 Essential (primary) hypertension: Secondary | ICD-10-CM | POA: Diagnosis not present

## 2019-03-08 DIAGNOSIS — E1143 Type 2 diabetes mellitus with diabetic autonomic (poly)neuropathy: Secondary | ICD-10-CM | POA: Diagnosis not present

## 2019-03-16 ENCOUNTER — Encounter: Payer: Self-pay | Admitting: Cardiovascular Disease

## 2019-03-16 ENCOUNTER — Ambulatory Visit (INDEPENDENT_AMBULATORY_CARE_PROVIDER_SITE_OTHER): Payer: Medicare Other | Admitting: Cardiovascular Disease

## 2019-03-16 VITALS — BP 120/55 | HR 74 | Ht 64.0 in | Wt 205.0 lb

## 2019-03-16 DIAGNOSIS — I10 Essential (primary) hypertension: Secondary | ICD-10-CM | POA: Diagnosis not present

## 2019-03-16 DIAGNOSIS — Z951 Presence of aortocoronary bypass graft: Secondary | ICD-10-CM

## 2019-03-16 DIAGNOSIS — I340 Nonrheumatic mitral (valve) insufficiency: Secondary | ICD-10-CM

## 2019-03-16 DIAGNOSIS — I25708 Atherosclerosis of coronary artery bypass graft(s), unspecified, with other forms of angina pectoris: Secondary | ICD-10-CM | POA: Diagnosis not present

## 2019-03-16 DIAGNOSIS — I6523 Occlusion and stenosis of bilateral carotid arteries: Secondary | ICD-10-CM

## 2019-03-16 DIAGNOSIS — I255 Ischemic cardiomyopathy: Secondary | ICD-10-CM

## 2019-03-16 DIAGNOSIS — E785 Hyperlipidemia, unspecified: Secondary | ICD-10-CM | POA: Diagnosis not present

## 2019-03-16 NOTE — Progress Notes (Signed)
Virtual Visit via Telephone Note   This visit type was conducted due to national recommendations for restrictions regarding the COVID-19 Pandemic (e.g. social distancing) in an effort to limit this patient's exposure and mitigate transmission in our community.  Due to her co-morbid illnesses, this patient is at least at moderate risk for complications without adequate follow up.  This format is felt to be most appropriate for this patient at this time.  The patient did not have access to video technology/had technical difficulties with video requiring transitioning to audio format only (telephone).  All issues noted in this document were discussed and addressed.  No physical exam could be performed with this format.  Please refer to the patient's chart for her  consent to telehealth for [facility].   Date:  03/16/2019   ID:  Karen Tate, DOB Sep 19, 1945, MRN [id number]  Patient Location: Home Provider Location: Office  PCP:  Neale Burly, MD  Cardiologist:  Kate Sable, MD  Electrophysiologist:  None   Evaluation Performed:  Follow-Up Visit  Chief Complaint: Coronary artery disease  History of Present Illness:    Karen Tate is a 73 y.o. female with coronary artery disease. She underwent three-vessel CABG on 09/18/2017 (left internal mammary artery to left anterior descending, sequential saphenous vein graft to ramus intermedius branches 1 and 2). She has a history of ischemic cardiomyopathy and DVT/PE, as well as postoperative atrial fibrillation.  She has a history of back surgery which has led to some exertional fatigue since then. She says she can't exercise like she wants to.  She denies chest pain and palpitations.  I reviewed her BP log over the phone.  She tested positive for COVID a month ago and her only complaint was weakness (symptoms lasted 3 weeks). She's doing much better now. She was retested last week and was negative.   Past Medical History:    Diagnosis Date  . Anxiety neurosis   . Arthritis   . CAD (coronary artery disease)    a. 08/2017: abnormal nuc-> sent directly to [facility], NSTEMI, s/p CABG (left internal mammary artery to left anterior descending, sequential saphenous vein graft to ramus intermedius branches 1 and 2).  . Carotid artery disease ([place])   . Chronic anemia   . Dizziness   . GERD (gastroesophageal reflux disease)   . Glaucoma   . Hiatal hernia   . Hyperlipemia   . Hypertension   . Ischemic cardiomyopathy   . Joint pain   . Postoperative atrial fibrillation ([place]) 08/2017  . Pulmonary embolus ([place])   . Type 2 diabetes mellitus ([place])    Past Surgical History:  Procedure Laterality Date  . CORONARY ARTERY BYPASS GRAFT N/A 09/18/2017   Procedure: CORONARY ARTERY BYPASS GRAFTING (CABG);  Surgeon: [name], MD;  Location: [place];  Service: Open Heart Surgery;  Laterality: N/A;  Saphenous vein harvest. LIMA to LAD Saphenous vein (sequential) ramus 1 & 2  . HERNIA REPAIR    . LEFT HEART CATH AND CORONARY ANGIOGRAPHY N/A 09/15/2017   Procedure: LEFT HEART CATH AND CORONARY ANGIOGRAPHY;  Surgeon: Lorretta Harp, MD;  Location: [place] CV LAB;  Service: Cardiovascular;  Laterality: N/A;  . SHOULDER SURGERY     LEFT  . TEE WITHOUT CARDIOVERSION N/A 09/18/2017   Procedure: TRANSESOPHAGEAL ECHOCARDIOGRAM (TEE);  Surgeon: [name], MD;  Location: [place];  Service: Open Heart Surgery;  Laterality: N/A;  . TOTAL KNEE ARTHROPLASTY     LEFT  .  VAGINAL HYSTERECTOMY       Current Meds  Medication Sig  . ALPRAZolam (XANAX) 0.5 MG tablet Take 0.5 mg by mouth 2 (two) times daily.  Marland Kitchen amLODipine (NORVASC) 10 MG tablet Take 10 mg by mouth daily.  Marland Kitchen aspirin 81 MG tablet Take 1 tablet (81 mg total) by mouth daily.  Marland Kitchen atorvastatin (LIPITOR) 80 MG tablet Take 1 tablet (80 mg total) by mouth daily at 6 PM.  . carvedilol (COREG) 25 MG tablet Take 25 mg by mouth 2 (two) times daily with a meal.  .  chlorthalidone (HYGROTON) 25 MG tablet Take 25 mg by mouth 2 (two) times daily.   . fish oil-omega-3 fatty acids 1000 MG capsule Take 1 g by mouth 3 (three) times daily.   Marland Kitchen glipiZIDE (GLUCOTROL) 5 MG tablet Take 5 mg by mouth 2 (two) times daily before a meal.  . lisinopril (ZESTRIL) 40 MG tablet   . magnesium oxide (MAG-OX) 400 MG tablet Take 1 tablet (400 mg total) by mouth 2 (two) times daily.  . meclizine (ANTIVERT) 25 MG tablet Take 25 mg by mouth 3 (three) times daily as needed for dizziness.  . metFORMIN (GLUCOPHAGE) 1000 MG tablet Take 1,000 mg by mouth 2 (two) times daily with a meal.  . Multiple Vitamins-Minerals (CENTRUM SILVER 50+WOMEN) TABS Take by mouth.  Marland Kitchen omeprazole (PRILOSEC) 40 MG capsule Take 40 mg by mouth daily.  Marland Kitchen tiZANidine (ZANAFLEX) 4 MG tablet Take 1 tablet (4 mg total) by mouth every 6 (six) hours as needed for muscle spasms.     Allergies:   Beta adrenergic blockers, Calcium channel blockers, and Naproxen   Social History   Tobacco Use  . Smoking status: Former Smoker    Packs/day: 1.25    Years: 16.00    Pack years: 20.00    Types: Cigarettes    Quit date: 03/25/1978    Years since quitting: 41.0  . Smokeless tobacco: Never Used  . Tobacco comment: quit more than 30 years ago  Substance Use Topics  . Alcohol use: No  . Drug use: No     Family Hx: The patient's family history includes Breast cancer in her mother; Diabetes in her father.  ROS:   Please see the history of present illness.     All other systems reviewed and are negative.   Prior CV studies:   The following studies were reviewed today:  Echocardiogram 11/18/2018:   1. The left ventricle has normal systolic function with an ejection fraction of 60-65%. The cavity size was normal. Left ventricular diastolic parameters were normal.  2. The right ventricle has mildly reduced systolic function. The cavity was normal. There is no increase in right ventricular wall thickness. Right  ventricular systolic pressure is moderately elevated with an estimated pressure of 49.5 mmHg.  3. The aortic valve is tricuspid. Mild calcification of the aortic valve. Mild to moderate aortic annular calcification noted.  4. The mitral valve is degenerative. Mild thickening of the mitral valve leaflet. Mild calcification of the mitral valve leaflet. Mitral valve regurgitation is moderate by color flow Doppler. The MR jet is eccentric posteriorly directed.  5. The tricuspid valve is grossly normal.  6. The aorta is normal unless otherwise noted.  Labs/Other Tests and Data Reviewed:    EKG:  No ECG reviewed.  Recent Labs: No results found for requested labs within last 8760 hours.   Recent Lipid Panel Lab Results  Component Value Date/Time   CHOL 152 09/13/2017 04:23  AM   TRIG 209 (H) 09/13/2017 04:23 AM   HDL 35 (L) 09/13/2017 04:23 AM   CHOLHDL 4.3 09/13/2017 04:23 AM   LDLCALC 75 09/13/2017 04:23 AM    Wt Readings from Last 3 Encounters:  03/16/19 205 lb (93 kg)  11/10/18 199 lb (90.3 kg)  08/03/18 202 lb (91.6 kg)     Objective:    Vital Signs:  BP (!) 120/55   Pulse 74   Ht 5\' 4"  (1.626 m)   Wt 205 lb (93 kg)   BMI 35.19 kg/m    VITAL SIGNS:  reviewed  ASSESSMENT & PLAN:    1. Coronary disease status post three-vessel CABG with non-STEMI: Symptomatically stable. Currently on aspirin, Lipitor 80 mg,lisinopril, and carvedilol.   LV systolic function has normalized by echocardiogram in August 2020.  2. Hypertension:Blood pressure is normal.  No changes to therapy. I reviewed her BP log.  3. Hyperlipidemia: Continue high intensity statin therapy with atorvastatin 80 mg.  4. Bilateral carotid artery disease: 1 to 39% stenosis in the right ICA and 40 to 59% stenosis in the left ICA on 09/17/2017. Continue aspirin and statin. I will monitor with surveillance Dopplers.  5.  Mitral regurgitation: Moderate in severity by echocardiogram in August 2020.  I will  monitor.    COVID-19 Education: The signs and symptoms of COVID-19 were discussed with the patient and how to seek care for testing (follow up with PCP or arrange E-visit).  The importance of social distancing was discussed today.  Time:   Today, I have spent 15 minutes with the patient with telehealth technology discussing the above problems.     Medication Adjustments/Labs and Tests Ordered: Current medicines are reviewed at length with the patient today.  Concerns regarding medicines are outlined above.   Tests Ordered: No orders of the defined types were placed in this encounter.   Medication Changes: No orders of the defined types were placed in this encounter.   Follow Up:  Virtual Visit  in 6 month(s)  Signed, Kate Sable, MD  03/16/2019 10:25 AM    [place]

## 2019-03-16 NOTE — Patient Instructions (Signed)
Medication Instructions:  Your physician recommends that you continue on your current medications as directed. Please refer to the Current Medication list given to you today.  *If you need a refill on your cardiac medications before your next appointment, please call your pharmacy*  Lab Work: Your physician recommends that you continue on your current medications as directed. Please refer to the Current Medication list given to you today.  If you have labs (blood work) drawn today and your tests are completely normal, you will receive your results only by: Marland Kitchen MyChart Message (if you have MyChart) OR . A paper copy in the mail If you have any lab test that is abnormal or we need to change your treatment, we will call you to review the results.  Testing/Procedures: None today  Follow-Up: At [facility], you and your health needs are our priority.  As part of our continuing mission to provide you with exceptional heart care, we have created designated Provider Care Teams.  These Care Teams include your primary Cardiologist (physician) and Advanced Practice Providers (APPs -  Physician Assistants and Nurse Practitioners) who all work together to provide you with the care you need, when you need it.  Your next appointment:   6 month(s)  The format for your next appointment:   Virtual Visit   Provider:   Kate Sable, MD  Other Instructions None        Thank you for choosing [facility] !

## 2019-04-07 DIAGNOSIS — E1143 Type 2 diabetes mellitus with diabetic autonomic (poly)neuropathy: Secondary | ICD-10-CM | POA: Diagnosis not present

## 2019-04-07 DIAGNOSIS — I10 Essential (primary) hypertension: Secondary | ICD-10-CM | POA: Diagnosis not present

## 2019-04-12 DIAGNOSIS — I700 Atherosclerosis of aorta: Secondary | ICD-10-CM | POA: Diagnosis not present

## 2019-04-12 DIAGNOSIS — R911 Solitary pulmonary nodule: Secondary | ICD-10-CM | POA: Diagnosis not present

## 2019-04-13 DIAGNOSIS — R918 Other nonspecific abnormal finding of lung field: Secondary | ICD-10-CM | POA: Diagnosis not present

## 2019-04-13 DIAGNOSIS — M25512 Pain in left shoulder: Secondary | ICD-10-CM | POA: Diagnosis not present

## 2019-04-14 ENCOUNTER — Other Ambulatory Visit (HOSPITAL_COMMUNITY): Payer: Self-pay | Admitting: Internal Medicine

## 2019-04-14 DIAGNOSIS — R918 Other nonspecific abnormal finding of lung field: Secondary | ICD-10-CM

## 2019-04-26 ENCOUNTER — Other Ambulatory Visit: Payer: Self-pay

## 2019-04-26 ENCOUNTER — Ambulatory Visit (HOSPITAL_COMMUNITY)
Admission: RE | Admit: 2019-04-26 | Discharge: 2019-04-26 | Disposition: A | Discharge status: Home / Self Care | Payer: Medicare Other | Source: Ambulatory Visit | Attending: Internal Medicine | Admitting: Internal Medicine

## 2019-04-26 DIAGNOSIS — R918 Other nonspecific abnormal finding of lung field: Secondary | ICD-10-CM | POA: Insufficient documentation

## 2019-04-26 DIAGNOSIS — R911 Solitary pulmonary nodule: Secondary | ICD-10-CM | POA: Diagnosis not present

## 2019-04-26 MED ORDER — FLUDEOXYGLUCOSE F - 18 (FDG) INJECTION
10.9000 | Freq: Once | INTRAVENOUS | Status: AC | PRN
  Administered 2019-04-26: 18:00:00 10.9 via INTRAVENOUS

## 2019-04-29 DIAGNOSIS — Z23 Encounter for immunization: Secondary | ICD-10-CM | POA: Diagnosis not present

## 2019-05-03 DIAGNOSIS — E119 Type 2 diabetes mellitus without complications: Secondary | ICD-10-CM | POA: Diagnosis not present

## 2019-05-03 DIAGNOSIS — M25512 Pain in left shoulder: Secondary | ICD-10-CM | POA: Diagnosis not present

## 2019-05-03 DIAGNOSIS — R918 Other nonspecific abnormal finding of lung field: Secondary | ICD-10-CM | POA: Diagnosis not present

## 2019-05-03 DIAGNOSIS — K219 Gastro-esophageal reflux disease without esophagitis: Secondary | ICD-10-CM | POA: Diagnosis not present

## 2019-05-03 DIAGNOSIS — N182 Chronic kidney disease, stage 2 (mild): Secondary | ICD-10-CM | POA: Diagnosis not present

## 2019-05-06 ENCOUNTER — Other Ambulatory Visit: Payer: Self-pay | Admitting: *Deleted

## 2019-05-06 ENCOUNTER — Encounter: Payer: Self-pay | Admitting: Internal Medicine

## 2019-05-06 ENCOUNTER — Institutional Professional Consult (permissible substitution) (INDEPENDENT_AMBULATORY_CARE_PROVIDER_SITE_OTHER): Payer: Medicare Other | Admitting: Thoracic Surgery (Cardiothoracic Vascular Surgery)

## 2019-05-06 ENCOUNTER — Encounter: Payer: Self-pay | Admitting: Thoracic Surgery (Cardiothoracic Vascular Surgery)

## 2019-05-06 ENCOUNTER — Other Ambulatory Visit: Payer: Self-pay

## 2019-05-06 VITALS — BP 154/70 | HR 72 | Temp 97.7°F | Resp 20 | Ht 64.0 in | Wt 189.0 lb

## 2019-05-06 DIAGNOSIS — Z951 Presence of aortocoronary bypass graft: Secondary | ICD-10-CM | POA: Diagnosis not present

## 2019-05-06 DIAGNOSIS — R911 Solitary pulmonary nodule: Secondary | ICD-10-CM

## 2019-05-06 NOTE — H&P (View-Only) (Signed)
PCP is Neale Burly, MD Referring Provider is Neale Burly, MD  Chief Complaint  Patient presents with  . Lung Lesion    Surgical eval, PET Scan 04/27/19, Chest CT 04/12/19, Chest CTA 12/30/18    HPI: Karen Tate sent for consultation regarding a right upper lobe lung nodule.  Karen Tate is a 74 year old woman with a history of coronary artery disease, postoperative atrial fibrillation, ischemic cardiomyopathy, DVT, pulmonary embolus, arthritis, reflux, hypertension, hyperlipidemia, type 2 diabetes, glaucoma and anxiety.  I did coronary artery bypass grafting x3 on her in June of 2019.  Postoperatively she had some atrial fibrillation but overall did well.  She had a CT angiogram in January 2018.  I will have access to those images but it did show pulmonary emboli.  There was no mention of any lung nodule in the report.  Somewhere along the line she was found to have a lung nodule.  She recently had a follow-up CT. In the report there is mention of comparison to a film from December 30, 2018.  There was a 13 x 10 mm spiculated nodule in the right upper lobe that appeared to be significantly enlarged per the radiologist.  That led to a PET/CT which showed the nodule was hypermetabolic with an SUV of 2.4.  There is no evidence of regional or distant adenopathy.  She has remote history of smoking but says she quit in 1980.  She is not very active but that is primarily due to arthritis.  She is accompanied by her daughter.  She does attend to her own housework and routine activities without any undue shortness of breath.  She denies chest pain, pressure, tightness, wheezing, cough, change in appetite, and weight loss.   Past Medical History:  Diagnosis Date  . Anxiety neurosis   . Arthritis   . CAD (coronary artery disease)    a. 08/2017: abnormal nuc-> sent directly to [facility], NSTEMI, s/p CABG (left internal mammary artery to left anterior descending, sequential saphenous vein graft to ramus  intermedius branches 1 and 2).  . Carotid artery disease ([place])   . Chronic anemia   . Dizziness   . GERD (gastroesophageal reflux disease)   . Glaucoma   . Hiatal hernia   . Hyperlipemia   . Hypertension   . Ischemic cardiomyopathy   . Joint pain   . Postoperative atrial fibrillation ([place]) 08/2017  . Pulmonary embolus ([place])   . Type 2 diabetes mellitus ([place])     Past Surgical History:  Procedure Laterality Date  . CORONARY ARTERY BYPASS GRAFT N/A 09/18/2017   Procedure: CORONARY ARTERY BYPASS GRAFTING (CABG);  Surgeon: [name], MD;  Location: [place];  Service: Open Heart Surgery;  Laterality: N/A;  Saphenous vein harvest. LIMA to LAD Saphenous vein (sequential) ramus 1 & 2  . HERNIA REPAIR    . LEFT HEART CATH AND CORONARY ANGIOGRAPHY N/A 09/15/2017   Procedure: LEFT HEART CATH AND CORONARY ANGIOGRAPHY;  Surgeon: Lorretta Harp, MD;  Location: [place] CV LAB;  Service: Cardiovascular;  Laterality: N/A;  . SHOULDER SURGERY     LEFT  . TEE WITHOUT CARDIOVERSION N/A 09/18/2017   Procedure: TRANSESOPHAGEAL ECHOCARDIOGRAM (TEE);  Surgeon: [name], MD;  Location: [place];  Service: Open Heart Surgery;  Laterality: N/A;  . TOTAL KNEE ARTHROPLASTY     LEFT  . VAGINAL HYSTERECTOMY      Family History  Problem Relation Age of Onset  . Diabetes Father   .  Breast cancer Mother     Social History Social History   Tobacco Use  . Smoking status: Former Smoker    Packs/day: 1.25    Years: 16.00    Pack years: 20.00    Types: Cigarettes    Quit date: 03/25/1978    Years since quitting: 41.1  . Smokeless tobacco: Never Used  . Tobacco comment: quit more than 30 years ago  Substance Use Topics  . Alcohol use: No  . Drug use: No    Current Outpatient Medications  Medication Sig Dispense Refill  . ALPRAZolam (XANAX) 0.5 MG tablet Take 0.5 mg by mouth 2 (two) times daily.    Marland Kitchen amLODipine (NORVASC) 10 MG tablet Take 10 mg by mouth daily.    Marland Kitchen aspirin  81 MG tablet Take 1 tablet (81 mg total) by mouth daily.    Marland Kitchen atorvastatin (LIPITOR) 80 MG tablet Take 1 tablet (80 mg total) by mouth daily at 6 PM. 90 tablet 3  . carvedilol (COREG) 25 MG tablet Take 25 mg by mouth 2 (two) times daily with a meal.    . chlorthalidone (HYGROTON) 25 MG tablet Take 25 mg by mouth 2 (two) times daily.     . fish oil-omega-3 fatty acids 1000 MG capsule Take 1 g by mouth 3 (three) times daily.     Marland Kitchen glipiZIDE (GLUCOTROL) 5 MG tablet Take 5 mg by mouth 2 (two) times daily before a meal.    . lisinopril (ZESTRIL) 40 MG tablet     . magnesium oxide (MAG-OX) 400 MG tablet Take 1 tablet (400 mg total) by mouth 2 (two) times daily. 180 tablet 3  . meclizine (ANTIVERT) 25 MG tablet Take 25 mg by mouth 3 (three) times daily as needed for dizziness.    . metFORMIN (GLUCOPHAGE) 1000 MG tablet Take 1,000 mg by mouth 2 (two) times daily with a meal.    . Multiple Vitamins-Minerals (CENTRUM SILVER 50+WOMEN) TABS Take by mouth.    Marland Kitchen omeprazole (PRILOSEC) 40 MG capsule Take 40 mg by mouth daily.    Marland Kitchen tiZANidine (ZANAFLEX) 4 MG tablet Take 1 tablet (4 mg total) by mouth every 6 (six) hours as needed for muscle spasms. 60 tablet 0   No current facility-administered medications for this visit.    Allergies  Allergen Reactions  . Beta Adrenergic Blockers Other (See Comments)    Dropped heart rate to the 40's  . Calcium Channel Blockers Other (See Comments)    Dropped HR into the 40's  . Naproxen Hives    Review of Systems  Constitutional: Negative for activity change, appetite change and unexpected weight change.  HENT: Negative for trouble swallowing and voice change.   Respiratory: Positive for shortness of breath (With exertion). Negative for cough and wheezing.   Cardiovascular: Negative for chest pain and leg swelling.  Gastrointestinal: Negative for abdominal distention and abdominal pain.  Genitourinary: Negative for difficulty urinating and dysuria.   Musculoskeletal: Positive for arthralgias and joint swelling.  Neurological: Negative for syncope and weakness.  Hematological: Negative for adenopathy. Bruises/bleeds easily.    BP (!) 154/70 (BP Location: Right Arm, Patient Position: Sitting, Cuff Size: Normal)   Pulse 72   Temp 97.7 F (36.5 C) (Temporal)   Resp 20   Ht 5\' 4"  (1.626 m)   Wt 189 lb (85.7 kg)   SpO2 100% Comment: RA  BMI 32.44 kg/m  Physical Exam Vitals reviewed.  Constitutional:      General: She is not in  acute distress. HENT:     Head: Normocephalic and atraumatic.  Eyes:     General: No scleral icterus.    Extraocular Movements: Extraocular movements intact.  Cardiovascular:     Rate and Rhythm: Normal rate and regular rhythm.     Heart sounds: Normal heart sounds. No murmur. No friction rub. No gallop.   Pulmonary:     Effort: Pulmonary effort is normal. No respiratory distress.     Breath sounds: No wheezing or rales.  Abdominal:     General: There is no distension.     Palpations: Abdomen is soft.     Tenderness: There is no abdominal tenderness.  Musculoskeletal:        General: No swelling.     Cervical back: Neck supple.  Lymphadenopathy:     Cervical: No cervical adenopathy.  Skin:    General: Skin is warm and dry.  Neurological:     General: No focal deficit present.     Mental Status: She is alert and oriented to person, place, and time.     Cranial Nerves: No cranial nerve deficit.     Motor: No weakness.     Diagnostic Tests: CT chest, 04/12/2019 Impression: 13 x 10 mm spiculated density is noted in the right upper lobe which appears to be slightly enlarged compared to prior exam.  This is highly concerning for malignancy.  PET scan is recommended for further evaluation.  These results will be called to the ordering clinician or representative by the radiologist Assistant, and communication documented in the PACS or zVision dashboard.  Status post coronary bypass graft.  Aortic  atherosclerosis  Electronically signed By: Marijo Conception., MD On: 04/12/2019 15:36  NUCLEAR MEDICINE PET SKULL BASE TO THIGH  TECHNIQUE: 10.9 mCi F-18 FDG was injected intravenously. Full-ring PET imaging was performed from the skull base to thigh after the radiotracer. CT data was obtained and used for attenuation correction and anatomic localization.  Fasting blood glucose: 104 mg/dl  COMPARISON:  CT chest 04/12/2019  FINDINGS: Mediastinal blood pool activity: SUV max 2.3  Liver activity: SUV max NA  NECK: Bilateral palatine tonsillar activity without significant masslike appearance on CT, maximum SUV 4.6 on the right and 3.6 on the left. This is most likely to be physiologic.  Incidental CT findings: Bilateral common carotid atherosclerotic calcification.  CHEST: The 1.1 cm right upper lobe pulmonary nodule on image 79/3 has maximum SUV of 2.4. In light of the enlargement of this lesion, this is likely a small malignancy.  No hypermetabolic or enlarged lymph nodes are observed in the chest. A 3 mm peripheral nodule in the right upper lobe is stable from 2018 and likely benign.  Incidental CT findings: Small bulla in the lung bases. Coronary, aortic arch, and branch vessel atherosclerotic vascular disease. Prior CABG.  ABDOMEN/PELVIS: Very high physiologic activity throughout much of the bowel. This is likely incidental but can reduce sensitivity in detecting lesions in the bowel. Focal activity medially in the gastric body near the cardia, maximum SUV 6.7, probably physiologic given the lack of a CT abnormality in this vicinity. No hypermetabolic activity or abnormal hypermetabolic activity in the solid organs.  A 0.7 by 1.6 cm subcutaneous nodule along the adipose tissue of the right anterior abdominal wall on image 235/3 has faintly accentuated metabolic activity, maximum SUV 3.2, likely an inflammatory lesion or injection site.  Incidental CT  findings: Aortoiliac atherosclerotic vascular disease. Ventral hernia mesh.  SKELETON: No significant abnormal hypermetabolic activity in  this region.  Incidental CT findings: Severe right greater than left degenerative hip arthropathy. Mild sclerosis along the sacroiliac joints. Postoperative findings in the lower lumbar spine.  IMPRESSION: 1. Mildly hypermetabolic 1.1 cm right upper lobe pulmonary nodule, maximum SUV 2.4. Given the increase in size, this is likely a small or low-grade malignancy. No findings of adenopathy or metastatic disease. 2. Other imaging findings of potential clinical significance: Aortic Atherosclerosis (ICD10-I70.0). Coronary atherosclerosis. Severe degenerative hip arthropathy.   Electronically Signed   By: Van Clines M.D.   On: 04/27/2019 09:10 I personally reviewed the PET/CT images and concur with the findings noted above  Pulmonary function testing 09/16/2017 FVC 1.66 (70%) FEV1 1.35 (73%) DLCO 15.93 (62%)  Impression: Karen Tate is a 74 year old woman with a remote history of tobacco abuse.  She also has a history of coronary artery disease, coronary artery bypass grafting, ischemic cardiomyopathy, postoperative atrial fibrillation, DVT, PE, hypertension, hyperlipidemia, type 2 diabetes, reflux, arthritis, and anxiety.  She recently found to have a new lung nodule.  On a CT in January the nodule had increased in size from a previous film in October per report.  On PET CT the nodule is hypermetabolic with an SUV of 2.4.  That is significant and a nodule of that size.  The differential diagnosis includes primary bronchogenic carcinoma, carcinoid tumor, as well as infectious and inflammatory nodules.  Of those primary bronchogenic carcinoma is the most likely, and this lesion has to be considered that unless we can prove otherwise.  I had a long discussion with Mrs. Owensby and her daughter.  We discussed potential approaches for diagnosis  and potential treatment options.  We discussed the pros and cons of attempting to do a biopsy.  I think there is a significant false negative rate so would not rely on a negative biopsy to rule out the possibility of cancer.  We discussed how choosing whether to treat with surgery versus radiation would impact how we went about making a definitive diagnosis.  She understands the issues involved.  I did talk to her about the relative advantages and disadvantages of surgery versus radiation.  I recommended to her that we proceed with a robotic right VATS for possible wedge resection or lobectomy depending on intraoperative findings.  I suspect she will require lobectomy.  She has not had recent pulmonary function testing, but her pulmonary function testing from 18 months ago showed adequate pulmonary reserve.  Given that she is not actively smoking I am not concerned about her ability to tolerate the operation, although we will repeat her pulmonary function testing if we can managed to get that scheduled during the COVID-19.  I informed her of the general nature of the operation including the need for general anesthesia, the incision to be used, the use of the robot, the use of drains to postoperatively the expected hospital stay, and the overall recovery.  I discussed the indications, risk, benefits, and alternatives.  She and her daughter understand the risks include, but not limited to death, MI, DVT, PE, bleeding, possible need for transfusion and/or conversion to thoracotomy, infection, prolonged air leak, cardiac arrhythmias, as well as the possibility of other unforeseeable complications.  She accepts the risks and wishes to proceed.  Plan: Repeat pulmonary function testing with and without bronchodilators Robotic right VATS for wedge resection or right upper lobectomy on 05/13/2019  [name], MD [facility] [phone]

## 2019-05-06 NOTE — Progress Notes (Signed)
PCP is Neale Burly, MD Referring Provider is Neale Burly, MD  Chief Complaint  Patient presents with  . Lung Lesion    Surgical eval, PET Scan 04/27/19, Chest CT 04/12/19, Chest CTA 12/30/18    HPI: Karen Tate sent for consultation regarding a right upper lobe lung nodule.  Karen Tate is a 74 year old woman with a history of coronary artery disease, postoperative atrial fibrillation, ischemic cardiomyopathy, DVT, pulmonary embolus, arthritis, reflux, hypertension, hyperlipidemia, type 2 diabetes, glaucoma and anxiety.  I did coronary artery bypass grafting x3 on her in June of 2019.  Postoperatively she had some atrial fibrillation but overall did well.  She had a CT angiogram in January 2018.  I will have access to those images but it did show pulmonary emboli.  There was no mention of any lung nodule in the report.  Somewhere along the line she was found to have a lung nodule.  She recently had a follow-up CT. In the report there is mention of comparison to a film from December 30, 2018.  There was a 13 x 10 mm spiculated nodule in the right upper lobe that appeared to be significantly enlarged per the radiologist.  That led to a PET/CT which showed the nodule was hypermetabolic with an SUV of 2.4.  There is no evidence of regional or distant adenopathy.  She has remote history of smoking but says she quit in 1980.  She is not very active but that is primarily due to arthritis.  She is accompanied by her daughter.  She does attend to her own housework and routine activities without any undue shortness of breath.  She denies chest pain, pressure, tightness, wheezing, cough, change in appetite, and weight loss.   Past Medical History:  Diagnosis Date  . Anxiety neurosis   . Arthritis   . CAD (coronary artery disease)    a. 08/2017: abnormal nuc-> sent directly to [facility], NSTEMI, s/p CABG (left internal mammary artery to left anterior descending, sequential saphenous vein graft to ramus  intermedius branches 1 and 2).  . Carotid artery disease ([place])   . Chronic anemia   . Dizziness   . GERD (gastroesophageal reflux disease)   . Glaucoma   . Hiatal hernia   . Hyperlipemia   . Hypertension   . Ischemic cardiomyopathy   . Joint pain   . Postoperative atrial fibrillation ([place]) 08/2017  . Pulmonary embolus ([place])   . Type 2 diabetes mellitus ([place])     Past Surgical History:  Procedure Laterality Date  . CORONARY ARTERY BYPASS GRAFT N/A 09/18/2017   Procedure: CORONARY ARTERY BYPASS GRAFTING (CABG);  Surgeon: [name], MD;  Location: [facility];  Service: Open Heart Surgery;  Laterality: N/A;  Saphenous vein harvest. LIMA to LAD Saphenous vein (sequential) ramus 1 & 2  . HERNIA REPAIR    . LEFT HEART CATH AND CORONARY ANGIOGRAPHY N/A 09/15/2017   Procedure: LEFT HEART CATH AND CORONARY ANGIOGRAPHY;  Surgeon: Lorretta Harp, MD;  Location: [facility];  Service: Cardiovascular;  Laterality: N/A;  . SHOULDER SURGERY     LEFT  . TEE WITHOUT CARDIOVERSION N/A 09/18/2017   Procedure: TRANSESOPHAGEAL ECHOCARDIOGRAM (TEE);  Surgeon: [name], MD;  Location: [facility];  Service: Open Heart Surgery;  Laterality: N/A;  . TOTAL KNEE ARTHROPLASTY     LEFT  . VAGINAL HYSTERECTOMY      Family History  Problem Relation Age of Onset  . Diabetes Father   .  Breast cancer Mother     Social History Social History   Tobacco Use  . Smoking status: Former Smoker    Packs/day: 1.25    Years: 16.00    Pack years: 20.00    Types: Cigarettes    Quit date: 03/25/1978    Years since quitting: 41.1  . Smokeless tobacco: Never Used  . Tobacco comment: quit more than 30 years ago  Substance Use Topics  . Alcohol use: No  . Drug use: No    Current Outpatient Medications  Medication Sig Dispense Refill  . ALPRAZolam (XANAX) 0.5 MG tablet Take 0.5 mg by mouth 2 (two) times daily.    Marland Kitchen amLODipine (NORVASC) 10 MG tablet Take 10 mg by mouth daily.    Marland Kitchen aspirin  81 MG tablet Take 1 tablet (81 mg total) by mouth daily.    Marland Kitchen atorvastatin (LIPITOR) 80 MG tablet Take 1 tablet (80 mg total) by mouth daily at 6 PM. 90 tablet 3  . carvedilol (COREG) 25 MG tablet Take 25 mg by mouth 2 (two) times daily with a meal.    . chlorthalidone (HYGROTON) 25 MG tablet Take 25 mg by mouth 2 (two) times daily.     . fish oil-omega-3 fatty acids 1000 MG capsule Take 1 g by mouth 3 (three) times daily.     Marland Kitchen glipiZIDE (GLUCOTROL) 5 MG tablet Take 5 mg by mouth 2 (two) times daily before a meal.    . lisinopril (ZESTRIL) 40 MG tablet     . magnesium oxide (MAG-OX) 400 MG tablet Take 1 tablet (400 mg total) by mouth 2 (two) times daily. 180 tablet 3  . meclizine (ANTIVERT) 25 MG tablet Take 25 mg by mouth 3 (three) times daily as needed for dizziness.    . metFORMIN (GLUCOPHAGE) 1000 MG tablet Take 1,000 mg by mouth 2 (two) times daily with a meal.    . Multiple Vitamins-Minerals (CENTRUM SILVER 50+WOMEN) TABS Take by mouth.    Marland Kitchen omeprazole (PRILOSEC) 40 MG capsule Take 40 mg by mouth daily.    Marland Kitchen tiZANidine (ZANAFLEX) 4 MG tablet Take 1 tablet (4 mg total) by mouth every 6 (six) hours as needed for muscle spasms. 60 tablet 0   No current facility-administered medications for this visit.    Allergies  Allergen Reactions  . Beta Adrenergic Blockers Other (See Comments)    Dropped heart rate to the 40's  . Calcium Channel Blockers Other (See Comments)    Dropped HR into the 40's  . Naproxen Hives    Review of Systems  Constitutional: Negative for activity change, appetite change and unexpected weight change.  HENT: Negative for trouble swallowing and voice change.   Respiratory: Positive for shortness of breath (With exertion). Negative for cough and wheezing.   Cardiovascular: Negative for chest pain and leg swelling.  Gastrointestinal: Negative for abdominal distention and abdominal pain.  Genitourinary: Negative for difficulty urinating and dysuria.   Musculoskeletal: Positive for arthralgias and joint swelling.  Neurological: Negative for syncope and weakness.  Hematological: Negative for adenopathy. Bruises/bleeds easily.    BP (!) 154/70 (BP Location: Right Arm, Patient Position: Sitting, Cuff Size: Normal)   Pulse 72   Temp 97.7 F (36.5 C) (Temporal)   Resp 20   Ht 5\' 4"  (1.626 m)   Wt 189 lb (85.7 kg)   SpO2 100% Comment: RA  BMI 32.44 kg/m  Physical Exam Vitals reviewed.  Constitutional:      General: She is not in  acute distress. HENT:     Head: Normocephalic and atraumatic.  Eyes:     General: No scleral icterus.    Extraocular Movements: Extraocular movements intact.  Cardiovascular:     Rate and Rhythm: Normal rate and regular rhythm.     Heart sounds: Normal heart sounds. No murmur. No friction rub. No gallop.   Pulmonary:     Effort: Pulmonary effort is normal. No respiratory distress.     Breath sounds: No wheezing or rales.  Abdominal:     General: There is no distension.     Palpations: Abdomen is soft.     Tenderness: There is no abdominal tenderness.  Musculoskeletal:        General: No swelling.     Cervical back: Neck supple.  Lymphadenopathy:     Cervical: No cervical adenopathy.  Skin:    General: Skin is warm and dry.  Neurological:     General: No focal deficit present.     Mental Status: She is alert and oriented to person, place, and time.     Cranial Nerves: No cranial nerve deficit.     Motor: No weakness.     Diagnostic Tests: CT chest, 04/12/2019 Impression: 13 x 10 mm spiculated density is noted in the right upper lobe which appears to be slightly enlarged compared to prior exam.  This is highly concerning for malignancy.  PET scan is recommended for further evaluation.  These results will be called to the ordering clinician or representative by the radiologist Assistant, and communication documented in the PACS or zVision dashboard.  Status post coronary bypass graft.  Aortic  atherosclerosis  Electronically signed By: Marijo Conception., MD On: 04/12/2019 15:36  NUCLEAR MEDICINE PET SKULL BASE TO THIGH  TECHNIQUE: 10.9 mCi F-18 FDG was injected intravenously. Full-ring PET imaging was performed from the skull base to thigh after the radiotracer. CT data was obtained and used for attenuation correction and anatomic localization.  Fasting blood glucose: 104 mg/dl  COMPARISON:  CT chest 04/12/2019  FINDINGS: Mediastinal blood pool activity: SUV max 2.3  Liver activity: SUV max NA  NECK: Bilateral palatine tonsillar activity without significant masslike appearance on CT, maximum SUV 4.6 on the right and 3.6 on the left. This is most likely to be physiologic.  Incidental CT findings: Bilateral common carotid atherosclerotic calcification.  CHEST: The 1.1 cm right upper lobe pulmonary nodule on image 79/3 has maximum SUV of 2.4. In light of the enlargement of this lesion, this is likely a small malignancy.  No hypermetabolic or enlarged lymph nodes are observed in the chest. A 3 mm peripheral nodule in the right upper lobe is stable from 2018 and likely benign.  Incidental CT findings: Small bulla in the lung bases. Coronary, aortic arch, and branch vessel atherosclerotic vascular disease. Prior CABG.  ABDOMEN/PELVIS: Very high physiologic activity throughout much of the bowel. This is likely incidental but can reduce sensitivity in detecting lesions in the bowel. Focal activity medially in the gastric body near the cardia, maximum SUV 6.7, probably physiologic given the lack of a CT abnormality in this vicinity. No hypermetabolic activity or abnormal hypermetabolic activity in the solid organs.  A 0.7 by 1.6 cm subcutaneous nodule along the adipose tissue of the right anterior abdominal wall on image 235/3 has faintly accentuated metabolic activity, maximum SUV 3.2, likely an inflammatory lesion or injection site.  Incidental CT  findings: Aortoiliac atherosclerotic vascular disease. Ventral hernia mesh.  SKELETON: No significant abnormal hypermetabolic activity in  this region.  Incidental CT findings: Severe right greater than left degenerative hip arthropathy. Mild sclerosis along the sacroiliac joints. Postoperative findings in the lower lumbar spine.  IMPRESSION: 1. Mildly hypermetabolic 1.1 cm right upper lobe pulmonary nodule, maximum SUV 2.4. Given the increase in size, this is likely a small or low-grade malignancy. No findings of adenopathy or metastatic disease. 2. Other imaging findings of potential clinical significance: Aortic Atherosclerosis (ICD10-I70.0). Coronary atherosclerosis. Severe degenerative hip arthropathy.   Electronically Signed   By: Van Clines M.D.   On: 04/27/2019 09:10 I personally reviewed the PET/CT images and concur with the findings noted above  Pulmonary function testing 09/16/2017 FVC 1.66 (70%) FEV1 1.35 (73%) DLCO 15.93 (62%)  Impression: Karen Tate is a 74 year old woman with a remote history of tobacco abuse.  She also has a history of coronary artery disease, coronary artery bypass grafting, ischemic cardiomyopathy, postoperative atrial fibrillation, DVT, PE, hypertension, hyperlipidemia, type 2 diabetes, reflux, arthritis, and anxiety.  She recently found to have a new lung nodule.  On a CT in January the nodule had increased in size from a previous film in October per report.  On PET CT the nodule is hypermetabolic with an SUV of 2.4.  That is significant and a nodule of that size.  The differential diagnosis includes primary bronchogenic carcinoma, carcinoid tumor, as well as infectious and inflammatory nodules.  Of those primary bronchogenic carcinoma is the most likely, and this lesion has to be considered that unless we can prove otherwise.  I had a long discussion with Karen Tate and her daughter.  We discussed potential approaches for diagnosis  and potential treatment options.  We discussed the pros and cons of attempting to do a biopsy.  I think there is a significant false negative rate so would not rely on a negative biopsy to rule out the possibility of cancer.  We discussed how choosing whether to treat with surgery versus radiation would impact how we went about making a definitive diagnosis.  She understands the issues involved.  I did talk to her about the relative advantages and disadvantages of surgery versus radiation.  I recommended to her that we proceed with a robotic right VATS for possible wedge resection or lobectomy depending on intraoperative findings.  I suspect she will require lobectomy.  She has not had recent pulmonary function testing, but her pulmonary function testing from 18 months ago showed adequate pulmonary reserve.  Given that she is not actively smoking I am not concerned about her ability to tolerate the operation, although we will repeat her pulmonary function testing if we can managed to get that scheduled during the COVID-19.  I informed her of the general nature of the operation including the need for general anesthesia, the incision to be used, the use of the robot, the use of drains to postoperatively the expected hospital stay, and the overall recovery.  I discussed the indications, risk, benefits, and alternatives.  She and her daughter understand the risks include, but not limited to death, MI, DVT, PE, bleeding, possible need for transfusion and/or conversion to thoracotomy, infection, prolonged air leak, cardiac arrhythmias, as well as the possibility of other unforeseeable complications.  She accepts the risks and wishes to proceed.  Plan: Repeat pulmonary function testing with and without bronchodilators Robotic right VATS for wedge resection or right upper lobectomy on 05/13/2019  [name], MD [facility] [phone]

## 2019-05-10 ENCOUNTER — Other Ambulatory Visit: Payer: Self-pay

## 2019-05-10 ENCOUNTER — Other Ambulatory Visit (HOSPITAL_COMMUNITY)
Admission: RE | Admit: 2019-05-10 | Discharge: 2019-05-10 | Disposition: A | Discharge status: Home / Self Care | Payer: Medicare Other | Source: Ambulatory Visit | Attending: Internal Medicine | Admitting: Internal Medicine

## 2019-05-10 DIAGNOSIS — Z01812 Encounter for preprocedural laboratory examination: Secondary | ICD-10-CM | POA: Insufficient documentation

## 2019-05-10 DIAGNOSIS — R911 Solitary pulmonary nodule: Secondary | ICD-10-CM

## 2019-05-10 DIAGNOSIS — Z20822 Contact with and (suspected) exposure to covid-19: Secondary | ICD-10-CM | POA: Insufficient documentation

## 2019-05-10 LAB — SARS CORONAVIRUS 2 (TAT 6-24 HRS): SARS Coronavirus 2: NEGATIVE

## 2019-05-11 NOTE — Progress Notes (Signed)
[place], [address] [place] [address] Phone: [phone] Fax: [phone]  [facility] - [place], [address] [place] [address] Phone: [phone] Fax: [phone]  [place] [address] [place] [address] Phone: [phone] Fax: [phone]      Your procedure is scheduled on Friday, February 18th.  Report to [facility] Main Entrance "A" at 6:00 A.M., and check in at the Admitting office.  Call this number if you have problems the morning of surgery:  [phone]  Call [phone] if you have any questions prior to your surgery date Monday-Friday 8am-4pm    Remember:  Do not eat or drink after midnight the night before your surgery    Take these medicines the morning of surgery with A SIP OF WATER  acetaminophen (TYLENOL) -as needed ALPRAZolam (XANAX)  amLODipine (NORVASC) carvedilol (COREG) meclizine (ANTIVERT)- as needed omeprazole (PRILOSEC) tiZANidine (ZANAFLEX)-as needed traMADol (ULTRAM)-as needed   Follow your surgeon's instructions on when to stop Aspirin.  If no instructions were given by your surgeon then you will need to call the office to get those instructions.    As of today, STOP taking any Aleve, Naproxen, Ibuprofen, Motrin, Advil, Goody's, BC's, all herbal medications, fish oil, and all vitamins.    WHAT DO I DO ABOUT MY DIABETES MEDICATION?   Marland Kitchen Do not take oral diabetes medicines (pills) the morning of surgery. metFORMIN (GLUCOPHAGE)   . THE NIGHT BEFORE SURGERY, DO NOT TAKE your dinner dose of glipiZIDE (GLUCOTROL). Do not take it on the day of surgery.     HOW TO MANAGE YOUR DIABETES BEFORE AND AFTER SURGERY  Why is it important to control my blood sugar before and after surgery? . Improving blood sugar levels before and after surgery helps healing and can limit problems. . A way  of improving blood sugar control is eating a healthy diet by: o  Eating less sugar and carbohydrates o  Increasing activity/exercise o  Talking with your doctor about reaching your blood sugar goals . High blood sugars (greater than 180 mg/dL) can raise your risk of infections and slow your recovery, so you will need to focus on controlling your diabetes during the weeks before surgery. . Make sure that the doctor who takes care of your diabetes knows about your planned surgery including the date and location.  How do I manage my blood sugar before surgery? . Check your blood sugar at least 4 times a day, starting 2 days before surgery, to make sure that the level is not too high or low. . Check your blood sugar the morning of your surgery when you wake up and every 2 hours until you get to the Short Stay unit. o If your blood sugar is less than 70 mg/dL, you will need to treat for low blood sugar: - Do not take insulin. - Treat a low blood sugar (less than 70 mg/dL) with  cup of clear juice (cranberry or apple), 4 glucose tablets, OR glucose gel. - Recheck blood sugar in 15 minutes after treatment (to make sure it is greater than 70 mg/dL). If your blood sugar is not greater than 70 mg/dL on recheck, call [phone] for further instructions. . Report your blood sugar to the short stay nurse when you get to Short Stay.  . If you are  admitted to the hospital after surgery: o Your blood sugar will be checked by the staff and you will probably be given insulin after surgery (instead of oral diabetes medicines) to make sure you have good blood sugar levels. o The goal for blood sugar control after surgery is 80-180 mg/dL.   The Morning of Surgery  Do not wear jewelry, make-up or nail polish.  Do not wear lotions, powders, or perfumes, or deodorant  Do not shave 48 hours prior to surgery.    Do not bring valuables to the hospital.  [facility] is not responsible for any belongings or  valuables.  If you are a smoker, DO NOT Smoke 24 hours prior to surgery  If you wear a CPAP at night please bring your mask the morning of surgery   Remember that you must have someone to transport you home after your surgery, and remain with you for 24 hours if you are discharged the same day.   Please bring cases for contacts, glasses, hearing aids, dentures or bridgework because it cannot be worn into surgery.    Leave your suitcase in the car.  After surgery it may be brought to your room.  For patients admitted to the hospital, discharge time will be determined by your treatment team.  Patients discharged the day of surgery will not be allowed to drive home.    Special instructions:   [place]- Preparing For Surgery  Before surgery, you can play an important role. Because skin is not sterile, your skin needs to be as free of germs as possible. You can reduce the number of germs on your skin by washing with CHG (chlorahexidine gluconate) Soap before surgery.  CHG is an antiseptic cleaner which kills germs and bonds with the skin to continue killing germs even after washing.    Oral Hygiene is also important to reduce your risk of infection.  Remember - BRUSH YOUR TEETH THE MORNING OF SURGERY WITH YOUR REGULAR TOOTHPASTE  Please do not use if you have an allergy to CHG or antibacterial soaps. If your skin becomes reddened/irritated stop using the CHG.  Do not shave (including legs and underarms) for at least 48 hours prior to first CHG shower. It is OK to shave your face.  Please follow these instructions carefully.   1. Shower the NIGHT BEFORE SURGERY and the MORNING OF SURGERY with CHG Soap.   2. If you chose to wash your hair, wash your hair first as usual with your normal shampoo.  3. After you shampoo, rinse your hair and body thoroughly to remove the shampoo.  4. Use CHG as you would any other liquid soap. You can apply CHG directly to the skin and wash gently with a  scrungie or a clean washcloth.   5. Apply the CHG Soap to your body ONLY FROM THE NECK DOWN.  Do not use on open wounds or open sores. Avoid contact with your eyes, ears, mouth and genitals (private parts). Wash Face and genitals (private parts)  with your normal soap.   6. Wash thoroughly, paying special attention to the area where your surgery will be performed.  7. Thoroughly rinse your body with warm water from the neck down.  8. DO NOT shower/wash with your normal soap after using and rinsing off the CHG Soap.  9. Pat yourself dry with a CLEAN TOWEL.  10. Wear CLEAN PAJAMAS to bed the night before surgery, wear comfortable clothes the morning of surgery  11. Place  CLEAN SHEETS on your bed the night of your first shower and DO NOT SLEEP WITH PETS.    Day of Surgery:  Please shower the morning of surgery with the CHG soap Do not apply any deodorants/lotions. Please wear clean clothes to the hospital/surgery center.   Remember to brush your teeth WITH YOUR REGULAR TOOTHPASTE.   Please read over the following fact sheets that you were given.

## 2019-05-12 ENCOUNTER — Encounter (HOSPITAL_COMMUNITY): Payer: Self-pay

## 2019-05-12 ENCOUNTER — Other Ambulatory Visit: Payer: Self-pay

## 2019-05-12 ENCOUNTER — Ambulatory Visit (HOSPITAL_COMMUNITY)
Admission: RE | Admit: 2019-05-12 | Discharge: 2019-05-12 | Disposition: A | Discharge status: Home / Self Care | Payer: Medicare Other | Source: Ambulatory Visit | Attending: Thoracic Surgery (Cardiothoracic Vascular Surgery) | Admitting: Thoracic Surgery (Cardiothoracic Vascular Surgery)

## 2019-05-12 ENCOUNTER — Encounter (HOSPITAL_COMMUNITY)
Admission: RE | Admit: 2019-05-12 | Discharge: 2019-05-12 | Disposition: A | Discharge status: Home / Self Care | Payer: Medicare Other | Source: Ambulatory Visit | Attending: Thoracic Surgery (Cardiothoracic Vascular Surgery) | Admitting: Thoracic Surgery (Cardiothoracic Vascular Surgery)

## 2019-05-12 DIAGNOSIS — N183 Chronic kidney disease, stage 3 unspecified: Secondary | ICD-10-CM | POA: Diagnosis not present

## 2019-05-12 DIAGNOSIS — C3411 Malignant neoplasm of upper lobe, right bronchus or lung: Secondary | ICD-10-CM | POA: Diagnosis not present

## 2019-05-12 DIAGNOSIS — I2510 Atherosclerotic heart disease of native coronary artery without angina pectoris: Secondary | ICD-10-CM | POA: Diagnosis not present

## 2019-05-12 DIAGNOSIS — R911 Solitary pulmonary nodule: Secondary | ICD-10-CM

## 2019-05-12 DIAGNOSIS — E785 Hyperlipidemia, unspecified: Secondary | ICD-10-CM | POA: Diagnosis not present

## 2019-05-12 DIAGNOSIS — Z8616 Personal history of COVID-19: Secondary | ICD-10-CM | POA: Diagnosis not present

## 2019-05-12 DIAGNOSIS — D62 Acute posthemorrhagic anemia: Secondary | ICD-10-CM | POA: Diagnosis not present

## 2019-05-12 DIAGNOSIS — Z23 Encounter for immunization: Secondary | ICD-10-CM | POA: Diagnosis not present

## 2019-05-12 DIAGNOSIS — Z86711 Personal history of pulmonary embolism: Secondary | ICD-10-CM | POA: Diagnosis not present

## 2019-05-12 DIAGNOSIS — I129 Hypertensive chronic kidney disease with stage 1 through stage 4 chronic kidney disease, or unspecified chronic kidney disease: Secondary | ICD-10-CM | POA: Diagnosis not present

## 2019-05-12 DIAGNOSIS — K219 Gastro-esophageal reflux disease without esophagitis: Secondary | ICD-10-CM | POA: Diagnosis not present

## 2019-05-12 DIAGNOSIS — I255 Ischemic cardiomyopathy: Secondary | ICD-10-CM | POA: Diagnosis not present

## 2019-05-12 DIAGNOSIS — M1990 Unspecified osteoarthritis, unspecified site: Secondary | ICD-10-CM | POA: Diagnosis not present

## 2019-05-12 HISTORY — DX: Nonrheumatic mitral (valve) insufficiency: I34.0

## 2019-05-12 HISTORY — DX: Unspecified asthma, uncomplicated: J45.909

## 2019-05-12 LAB — URINALYSIS, ROUTINE W REFLEX MICROSCOPIC
Bacteria, UA: NONE SEEN
Bilirubin Urine: NEGATIVE
Glucose, UA: NEGATIVE mg/dL
Hgb urine dipstick: NEGATIVE
Ketones, ur: NEGATIVE mg/dL
Nitrite: NEGATIVE
Protein, ur: NEGATIVE mg/dL
Specific Gravity, Urine: 1.014 (ref 1.005–1.030)
pH: 6 (ref 5.0–8.0)

## 2019-05-12 LAB — PULMONARY FUNCTION TEST
DL/VA % pred: 104 %
DL/VA: 4.27 ml/min/mmHg/L
DLCO unc % pred: 63 %
DLCO unc: 12.18 ml/min/mmHg
FEF 25-75 Post: 0.83 L/sec
FEF 25-75 Pre: 0.93 L/sec
FEF2575-%Change-Post: -11 %
FEF2575-%Pred-Post: 54 %
FEF2575-%Pred-Pre: 61 %
FEV1-%Change-Post: -2 %
FEV1-%Pred-Post: 58 %
FEV1-%Pred-Pre: 59 %
FEV1-Post: 1 L
FEV1-Pre: 1.02 L
FEV1FVC-%Change-Post: 6 %
FEV1FVC-%Pred-Pre: 103 %
FEV6-%Change-Post: -6 %
FEV6-%Pred-Post: 55 %
FEV6-%Pred-Pre: 59 %
FEV6-Post: 1.18 L
FEV6-Pre: 1.27 L
FEV6FVC-%Change-Post: 1 %
FEV6FVC-%Pred-Post: 104 %
FEV6FVC-%Pred-Pre: 102 %
FVC-%Change-Post: -8 %
FVC-%Pred-Post: 53 %
FVC-%Pred-Pre: 58 %
FVC-Post: 1.18 L
FVC-Pre: 1.29 L
Post FEV1/FVC ratio: 84 %
Post FEV6/FVC ratio: 100 %
Pre FEV1/FVC ratio: 79 %
Pre FEV6/FVC Ratio: 98 %
RV % pred: 72 %
RV: 1.65 L
TLC % pred: 60 %
TLC: 3.07 L

## 2019-05-12 LAB — COMPREHENSIVE METABOLIC PANEL
ALT: 17 U/L (ref 0–44)
AST: 18 U/L (ref 15–41)
Albumin: 3.8 g/dL (ref 3.5–5.0)
Alkaline Phosphatase: 40 U/L (ref 38–126)
Anion gap: 13 (ref 5–15)
BUN: 21 mg/dL (ref 8–23)
CO2: 22 mmol/L (ref 22–32)
Calcium: 10 mg/dL (ref 8.9–10.3)
Chloride: 105 mmol/L (ref 98–111)
Creatinine, Ser: 1.26 mg/dL — ABNORMAL HIGH (ref 0.44–1.00)
GFR calc Af Amer: 48 mL/min — ABNORMAL LOW (ref >60)
GFR calc non Af Amer: 42 mL/min — ABNORMAL LOW (ref >60)
Glucose, Bld: 147 mg/dL — ABNORMAL HIGH (ref 70–99)
Potassium: 4.4 mmol/L (ref 3.5–5.1)
Sodium: 140 mmol/L (ref 135–145)
Total Bilirubin: 0.7 mg/dL (ref 0.3–1.2)
Total Protein: 7.2 g/dL (ref 6.5–8.1)

## 2019-05-12 LAB — CBC
HCT: 31.8 % — ABNORMAL LOW (ref 36.0–46.0)
Hemoglobin: 9.3 g/dL — ABNORMAL LOW (ref 12.0–15.0)
MCH: 27.1 pg (ref 26.0–34.0)
MCHC: 29.2 g/dL — ABNORMAL LOW (ref 30.0–36.0)
MCV: 92.7 fL (ref 80.0–100.0)
Platelets: 396 10*3/uL (ref 150–400)
RBC: 3.43 MIL/uL — ABNORMAL LOW (ref 3.87–5.11)
RDW: 18.7 % — ABNORMAL HIGH (ref 11.5–15.5)
WBC: 6.2 10*3/uL (ref 4.0–10.5)
nRBC: 0 % (ref 0.0–0.2)

## 2019-05-12 LAB — APTT: aPTT: 32 seconds (ref 24–36)

## 2019-05-12 LAB — GLUCOSE, CAPILLARY: Glucose-Capillary: 181 mg/dL — ABNORMAL HIGH (ref 70–99)

## 2019-05-12 LAB — HEMOGLOBIN A1C
Hgb A1c MFr Bld: 7.2 % — ABNORMAL HIGH (ref 4.8–5.6)
Mean Plasma Glucose: 159.94 mg/dL

## 2019-05-12 LAB — SURGICAL PCR SCREEN
MRSA, PCR: NEGATIVE
Staphylococcus aureus: NEGATIVE

## 2019-05-12 LAB — PROTIME-INR
INR: 1 (ref 0.8–1.2)
Prothrombin Time: 12.9 seconds (ref 11.4–15.2)

## 2019-05-12 MED ORDER — ALBUTEROL SULFATE (2.5 MG/3ML) 0.083% IN NEBU
2.5000 mg | INHALATION_SOLUTION | Freq: Once | RESPIRATORY_TRACT | Status: AC
  Administered 2019-05-12: 2.5 mg via RESPIRATORY_TRACT

## 2019-05-12 NOTE — Progress Notes (Signed)
PCP - Neale Burly, MD Cardiologist - Kate Sable, MD  PPM/ICD - Denies  Chest x-ray - 05/12/19 EKG - 05/12/19 Stress Test - 09/12/17 ECHO - 11/18/18 Cardiac Cath - 09/15/17  Sleep Study - Denies  Fasting Blood Sugar - 125-160s Checks Blood Sugar x1 daily  Blood Thinner Instructions: N/A Aspirin Instructions: Patient stated she will call Dr. Leonarda Salon office for instructions.  ERAS Protcol - N/A PRE-SURGERY Ensure or G2- N/A  COVID TEST- 05/10/19 (negative)   Anesthesia review: Yes, cardiac hx.  Patient denies shortness of breath, fever, cough and chest pain at PAT appointment   All instructions explained to the patient, with a verbal understanding of the material. Patient agrees to go over the instructions while at home for a better understanding. Patient also instructed to self quarantine after being tested for COVID-19. The opportunity to ask questions was provided.

## 2019-05-12 NOTE — Progress Notes (Signed)
Your procedure is scheduled on Thursday, February 18th, from 08:00 AM to 11:30 AM.  Report to [facility] Main Entrance "A" at 6:00 A.M., and check in at the Admitting office.  Call this number if you have problems the morning of surgery:  [phone]  Call [phone] if you have any questions prior to your surgery date Monday-Friday 8am-4pm.    Remember:  Do not eat or drink after midnight the night before your surgery.    Take these medicines the morning of surgery with A SIP OF WATER:  ALPRAZolam (XANAX)  amLODipine (NORVASC) carvedilol (COREG)     omeprazole (PRILOSEC)     acetaminophen (TYLENOL) -as needed meclizine (ANTIVERT)- as needed tiZANidine (ZANAFLEX)-as needed traMADol (ULTRAM)-as needed   Follow your surgeon's instructions on when to stop Aspirin.  If no instructions were given by your surgeon then you will need to call the office to get those instructions.    As of today, STOP taking any Aleve, Naproxen, Ibuprofen, Motrin, Advil, Goody's, BC's, all herbal medications, fish oil, and all vitamins.    WHAT DO I DO ABOUT MY DIABETES MEDICATION?   . THE NIGHT BEFORE SURGERY, DO NOT TAKE your dinner dose of glipiZIDE (GLUCOTROL). Do not take it on the day of surgery.      Do not take metFORMIN (GLUCOPHAGE) OR glipiZIDE (GLUCOTROL) the morning of surgery.    HOW TO MANAGE YOUR DIABETES BEFORE AND AFTER SURGERY  Why is it important to control my blood sugar before and after surgery? . Improving blood sugar levels before and after surgery helps healing and can limit problems. . A way of improving blood sugar control is eating a healthy diet by: o  Eating less sugar and carbohydrates o  Increasing activity/exercise o  Talking with your doctor about reaching your blood sugar goals . High blood sugars (greater than 180 mg/dL) can raise your risk of infections and slow your recovery, so you will need to focus on controlling your diabetes during the weeks before  surgery. . Make sure that the doctor who takes care of your diabetes knows about your planned surgery including the date and location.  How do I manage my blood sugar before surgery? . Check your blood sugar at least 4 times a day, starting 2 days before surgery, to make sure that the level is not too high or low. . Check your blood sugar the morning of your surgery when you wake up and every 2 hours until you get to the Short Stay unit. o If your blood sugar is less than 70 mg/dL, you will need to treat for low blood sugar: - Do not take insulin. - Treat a low blood sugar (less than 70 mg/dL) with  cup of clear juice (cranberry or apple), 4 glucose tablets, OR glucose gel. - Recheck blood sugar in 15 minutes after treatment (to make sure it is greater than 70 mg/dL). If your blood sugar is not greater than 70 mg/dL on recheck, call [phone] for further instructions. . Report your blood sugar to the short stay nurse when you get to Short Stay.  . If you are admitted to the hospital after surgery: o Your blood sugar will be checked by the staff and you will probably be given insulin after surgery (instead of oral diabetes medicines) to make sure you have good blood sugar levels. o The goal for blood sugar control after surgery is 80-180 mg/dL.   The Morning of Surgery  Do not wear jewelry,  make-up or nail polish.  Do not wear lotions, powders, or perfumes, or deodorant  Do not shave 48 hours prior to surgery.    Do not bring valuables to the hospital.  [facility] is not responsible for any belongings or valuables.  If you are a smoker, DO NOT Smoke 24 hours prior to surgery  If you wear a CPAP at night please bring your mask the morning of surgery   Remember that you must have someone to transport you home after your surgery, and remain with you for 24 hours if you are discharged the same day.   Please bring cases for contacts, glasses, hearing aids, dentures or bridgework because  it cannot be worn into surgery.    Leave your suitcase in the car.  After surgery it may be brought to your room.  For patients admitted to the hospital, discharge time will be determined by your treatment team.  Patients discharged the day of surgery will not be allowed to drive home.    Special instructions:   [place]- Preparing For Surgery  Before surgery, you can play an important role. Because skin is not sterile, your skin needs to be as free of germs as possible. You can reduce the number of germs on your skin by washing with CHG (chlorahexidine gluconate) Soap before surgery.  CHG is an antiseptic cleaner which kills germs and bonds with the skin to continue killing germs even after washing.    Oral Hygiene is also important to reduce your risk of infection.  Remember - BRUSH YOUR TEETH THE MORNING OF SURGERY WITH YOUR REGULAR TOOTHPASTE  Please do not use if you have an allergy to CHG or antibacterial soaps. If your skin becomes reddened/irritated stop using the CHG.  Do not shave (including legs and underarms) for at least 48 hours prior to first CHG shower. It is OK to shave your face.  Please follow these instructions carefully.   1. Shower the NIGHT BEFORE SURGERY and the MORNING OF SURGERY with CHG Soap.   2. If you chose to wash your hair, wash your hair first as usual with your normal shampoo.  3. After you shampoo, rinse your hair and body thoroughly to remove the shampoo.  4. Use CHG as you would any other liquid soap. You can apply CHG directly to the skin and wash gently with a scrungie or a clean washcloth.   5. Apply the CHG Soap to your body ONLY FROM THE NECK DOWN.  Do not use on open wounds or open sores. Avoid contact with your eyes, ears, mouth and genitals (private parts). Wash Face and genitals (private parts)  with your normal soap.   6. Wash thoroughly, paying special attention to the area where your surgery will be performed.  7. Thoroughly rinse  your body with warm water from the neck down.  8. DO NOT shower/wash with your normal soap after using and rinsing off the CHG Soap.  9. Pat yourself dry with a CLEAN TOWEL.  10. Wear CLEAN PAJAMAS to bed the night before surgery, wear comfortable clothes the morning of surgery  11. Place CLEAN SHEETS on your bed the night of your first shower and DO NOT SLEEP WITH PETS.    Day of Surgery:  Please shower the morning of surgery with the CHG soap Do not apply any deodorants/lotions. Please wear clean clothes to the hospital/surgery center.   Remember to brush your teeth WITH YOUR REGULAR TOOTHPASTE.   Please read  over the following fact sheets that you were given.

## 2019-05-12 NOTE — Anesthesia Preprocedure Evaluation (Addendum)
Anesthesia Evaluation  Patient identified by MRN, date of birth, ID band Patient awake    Reviewed: Allergy & Precautions, H&P , NPO status , Patient's Chart, lab work & pertinent test results  Airway Mallampati: II   Neck ROM: full    Dental   Pulmonary former smoker,  RUL nodule   breath sounds clear to auscultation       Cardiovascular hypertension, + CAD and + CABG   Rhythm:regular Rate:Normal     Neuro/Psych PSYCHIATRIC DISORDERS Anxiety    GI/Hepatic hiatal hernia, GERD  ,  Endo/Other  diabetes  Renal/GU      Musculoskeletal  (+) Arthritis ,   Abdominal   Peds  Hematology  (+) Blood dyscrasia, anemia ,   Anesthesia Other Findings   Reproductive/Obstetrics                            Anesthesia Physical Anesthesia Plan  ASA: III  Anesthesia Plan: General   Post-op Pain Management:    Induction: Intravenous  PONV Risk Score and Plan: 3 and Ondansetron, Dexamethasone, Midazolam and Treatment may vary due to age or medical condition  Airway Management Planned: Oral ETT and Double Lumen EBT  Additional Equipment: Arterial line, CVP and Ultrasound Guidance Line Placement  Intra-op Plan:   Post-operative Plan: Extubation in OR  Informed Consent: I have reviewed the patients History and Physical, chart, labs and discussed the procedure including the risks, benefits and alternatives for the proposed anesthesia with the patient or authorized representative who has indicated his/her understanding and acceptance.       Plan Discussed with: CRNA, Anesthesiologist and Surgeon  Anesthesia Plan Comments: (PAT note written 05/12/2019 by Myra Gianotti, PA-C. For repeat EKG on the day of surgery due to poor quality tracing on 05/11/19. )       Anesthesia Quick Evaluation

## 2019-05-12 NOTE — Progress Notes (Signed)
Anesthesia Chart Review:  Case: [id number] Date/Time: 05/13/19 0745   Procedure: XI ROBOTIC ASSISTED THORASCOPY-POSSIBLE WEDGE RESECTION OR RIGHT UPPER LOBECTOMY (Right Chest)   Anesthesia type: General   Pre-op diagnosis: RUL NODULE   Location: [facility] OR ROOM 10 / [place] OR   Surgeons: [name], MD      DISCUSSION: Patient is a 74 year old female scheduled for the above procedure.  History includes former smoker (quit 1980), CAD (NSTEMI, s/p CABG: LIMA-LAD, SVG-RI1-RI2 09/18/17), post-op afib (08/2017), ischemic cardiomyopathy, mitral regurgitation (moderate MR 10/2018), HTN, DM2, PE (04/02/16), HLD, GERD, hiatal hernia, glaucoma, chronic anemia, asthma, carotid artery disease (9-32% RICA, 35-57% LICA 05/2200), back surgery (L4-S1 posterolateral arthrodesis). COVID-19 ~ 01/2019 (by cardiology notes).   Last evaluation by cardiologist Dr. Bronson Ing 03/16/19. EF normalized by 10/2018 echo. He will continue to monitor MR and carotid stenosis. Six month follow-up planned.  Presurgical COVID-19 test negative on 05/10/19. 05/12/19 CXR is still in process. 05/12/19 EKG has diffuse baseline artifact. Automated tracing of accelerated junction rhythm, but I believe there are likely p waves in lead III.  Rhythm is regular, so I doubt afib.  I did review the tracing with anesthesiologist Suzette Battiest, MD who agreed. Will plan to repeat EKG tracing on arrival to PAT to get a better baseline tracing. Anesthesia team to review updated tracing and evaluate patient on the day of surgery.     VS: BP 135/60   Pulse 66   Temp 36.7 C (Oral)   Resp 17   Ht 5\' 4"  (1.626 m)   Wt 86.6 kg   SpO2 99%   BMI 32.77 kg/m    PROVIDERS: Neale Burly, MD is PCP  Kate Sable, MD is cardiologist   LABS: Preoperative labs noted. Cr 1.26 which is consistent with results from July 2019. H/H 9.3/31.8--currently, I don't have recent comparison labs, but with known anemia history. T&S done. A1c 7.2%. Labs are  marked as reviewed by surgeon. (all labs ordered are listed, but only abnormal results are displayed)  Labs Reviewed  GLUCOSE, CAPILLARY - Abnormal; Notable for the following components:      Result Value   Glucose-Capillary 181 (*)    All other components within normal limits  CBC - Abnormal; Notable for the following components:   RBC 3.43 (*)    Hemoglobin 9.3 (*)    HCT 31.8 (*)    MCHC 29.2 (*)    RDW 18.7 (*)    All other components within normal limits  COMPREHENSIVE METABOLIC PANEL - Abnormal; Notable for the following components:   Glucose, Bld 147 (*)    Creatinine, Ser 1.26 (*)    GFR calc non Af Amer 42 (*)    GFR calc Af Amer 48 (*)    All other components within normal limits  URINALYSIS, ROUTINE W REFLEX MICROSCOPIC - Abnormal; Notable for the following components:   Leukocytes,Ua TRACE (*)    All other components within normal limits  HEMOGLOBIN A1C - Abnormal; Notable for the following components:   Hgb A1c MFr Bld 7.2 (*)    All other components within normal limits  SURGICAL PCR SCREEN  APTT  PROTIME-INR  BLOOD GAS, ARTERIAL  TYPE AND SCREEN    Pulmonary function testing 05/12/19 FVC 1.29 (58%) FEV1 1.02 (59%) DLCO unc 12.18 (63%)   IMAGES: CXR 05/12/19: In process.  PET Scan 04/26/19: IMPRESSION: 1. Mildly hypermetabolic 1.1 cm right upper lobe pulmonary nodule, maximum SUV 2.4. Given the increase in size, this  is likely a small or low-grade malignancy. No findings of adenopathy or metastatic disease. 2. Other imaging findings of potential clinical significance: Aortic Atherosclerosis (ICD10-I70.0). Coronary atherosclerosis. Severe degenerative hip arthropathy.  CT Chest 04/12/19: IMPRESSION: 1.  13 x 10 mm spiculated density is noted in right upper lobe which appears to be slightly enlarged compared to prior exam.  This is highly concerning for malignancy.  PET scan is recommended for further evaluation. 2.  Status post coronary bypass  graft. 3.  Aortic atherosclerosis.   EKG: See DISCUSSION. For repeat EKG on the day of surgery.   CV: Echo 11/18/18: IMPRESSIONS  1. The left ventricle has normal systolic function with an ejection  fraction of 60-65%. The cavity size was normal. Left ventricular diastolic  parameters were normal.  2. The right ventricle has mildly reduced systolic function. The cavity  was normal. There is no increase in right ventricular wall thickness.  Right ventricular systolic pressure is moderately elevated with an  estimated pressure of 49.5 mmHg.  3. The aortic valve is tricuspid. Mild calcification of the aortic valve.  Mild to moderate aortic annular calcification noted.  4. The mitral valve is degenerative. Mild thickening of the mitral valve  leaflet. Mild calcification of the mitral valve leaflet. Mitral valve  regurgitation is moderate by color flow Doppler. The MR jet is eccentric  posteriorly directed.  5. The tricuspid valve is grossly normal.  6. The aorta is normal unless otherwise noted.  Carotid US 09/17/17: Final Interpretation:  - Right Carotid: Velocities in the right ICA are consistent with a 1-39%  stenosis.  - Left Carotid: Velocities in the left ICA are consistent with a 40-59%  stenosis.  - Vertebrals: Bilateral vertebral arteries demonstrate antegrade flow.   Last cardiac cath was post MI/pre-CABG on 09/15/17 and showed severe disease of the LAD, ramus, and non-dominant AV groove CX, EF 25-35%.   Past Medical History:  Diagnosis Date  . Anxiety neurosis   . Arthritis   . Asthma   . CAD (coronary artery disease)    a. 08/2017: abnormal nuc-> sent directly to [facility], NSTEMI, s/p CABG (left internal mammary artery to left anterior descending, sequential saphenous vein graft to ramus intermedius branches 1 and 2).  . Carotid artery disease ([place])   . Chronic anemia   . Dizziness   . GERD (gastroesophageal reflux disease)   . Glaucoma   . Hiatal hernia   .  Hyperlipemia   . Hypertension   . Ischemic cardiomyopathy   . Joint pain   . Mitral regurgitation   . Postoperative atrial fibrillation ([place]) 08/2017  . Pulmonary embolus ([place])   . Type 2 diabetes mellitus ([place])     Past Surgical History:  Procedure Laterality Date  . BACK SURGERY  2016  . CORONARY ARTERY BYPASS GRAFT N/A 09/18/2017   Procedure: CORONARY ARTERY BYPASS GRAFTING (CABG);  Surgeon: [name], MD;  Location: [place];  Service: Open Heart Surgery;  Laterality: N/A;  Saphenous vein harvest. LIMA to LAD Saphenous vein (sequential) ramus 1 & 2  . EYE SURGERY Bilateral    "surgery for glaucoma"  . HERNIA REPAIR    . LEFT HEART CATH AND CORONARY ANGIOGRAPHY N/A 09/15/2017   Procedure: LEFT HEART CATH AND CORONARY ANGIOGRAPHY;  Surgeon: Lorretta Harp, MD;  Location: [place] CV [facility];  Service: Cardiovascular;  Laterality: N/A;  . ROTATOR CUFF REPAIR Left    x2  . SHOULDER SURGERY     LEFT  .  TEE WITHOUT CARDIOVERSION N/A 09/18/2017   Procedure: TRANSESOPHAGEAL ECHOCARDIOGRAM (TEE);  Surgeon: [name], MD;  Location: [place];  Service: Open Heart Surgery;  Laterality: N/A;  . TOTAL KNEE ARTHROPLASTY     LEFT  . VAGINAL HYSTERECTOMY     partial    MEDICATIONS: . acetaminophen (TYLENOL) 500 MG tablet  . ALPRAZolam (XANAX) 0.5 MG tablet  . amLODipine (NORVASC) 10 MG tablet  . aspirin 81 MG tablet  . atorvastatin (LIPITOR) 80 MG tablet  . carvedilol (COREG) 25 MG tablet  . chlorthalidone (HYGROTON) 25 MG tablet  . fish oil-omega-3 fatty acids 1000 MG capsule  . glipiZIDE (GLUCOTROL) 5 MG tablet  . latanoprost (XALATAN) 0.005 % ophthalmic solution  . lisinopril (ZESTRIL) 40 MG tablet  . magnesium oxide (MAG-OX) 400 MG tablet  . meclizine (ANTIVERT) 25 MG tablet  . metFORMIN (GLUCOPHAGE) 1000 MG tablet  . Multiple Vitamins-Minerals (CENTRUM SILVER 50+WOMEN) TABS  . omeprazole (PRILOSEC) 40 MG capsule  . tiZANidine (ZANAFLEX) 4 MG tablet  .  traMADol (ULTRAM) 50 MG tablet   No current facility-administered medications for this encounter.    Myra Gianotti, PA-C Surgical Short Stay/Anesthesiology [facility] Phone [phone] [facility] Phone [phone] 05/12/2019 4:54 PM

## 2019-05-13 ENCOUNTER — Inpatient Hospital Stay (HOSPITAL_COMMUNITY): Payer: Medicare Other | Admitting: Certified Registered Nurse Anesthetist

## 2019-05-13 ENCOUNTER — Inpatient Hospital Stay (HOSPITAL_COMMUNITY)
Admission: RE | Admit: 2019-05-13 | Discharge: 2019-05-16 | DRG: 164 | Disposition: A | Discharge status: Home / Self Care | Payer: Medicare Other | Attending: Thoracic Surgery (Cardiothoracic Vascular Surgery) | Admitting: Thoracic Surgery (Cardiothoracic Vascular Surgery)

## 2019-05-13 ENCOUNTER — Encounter (HOSPITAL_COMMUNITY)
Admission: RE | Disposition: A | Discharge status: Home / Self Care | Payer: Self-pay | Source: Home / Self Care | Attending: Thoracic Surgery (Cardiothoracic Vascular Surgery)

## 2019-05-13 ENCOUNTER — Encounter (HOSPITAL_COMMUNITY): Payer: Self-pay | Admitting: Thoracic Surgery (Cardiothoracic Vascular Surgery)

## 2019-05-13 ENCOUNTER — Inpatient Hospital Stay (HOSPITAL_COMMUNITY): Payer: Medicare Other

## 2019-05-13 DIAGNOSIS — N183 Chronic kidney disease, stage 3 unspecified: Secondary | ICD-10-CM | POA: Diagnosis not present

## 2019-05-13 DIAGNOSIS — H409 Unspecified glaucoma: Secondary | ICD-10-CM | POA: Diagnosis present

## 2019-05-13 DIAGNOSIS — Z951 Presence of aortocoronary bypass graft: Secondary | ICD-10-CM | POA: Diagnosis not present

## 2019-05-13 DIAGNOSIS — Z23 Encounter for immunization: Secondary | ICD-10-CM

## 2019-05-13 DIAGNOSIS — I25118 Atherosclerotic heart disease of native coronary artery with other forms of angina pectoris: Secondary | ICD-10-CM | POA: Diagnosis not present

## 2019-05-13 DIAGNOSIS — Z86711 Personal history of pulmonary embolism: Secondary | ICD-10-CM

## 2019-05-13 DIAGNOSIS — E785 Hyperlipidemia, unspecified: Secondary | ICD-10-CM | POA: Diagnosis present

## 2019-05-13 DIAGNOSIS — Z86718 Personal history of other venous thrombosis and embolism: Secondary | ICD-10-CM

## 2019-05-13 DIAGNOSIS — I252 Old myocardial infarction: Secondary | ICD-10-CM | POA: Diagnosis not present

## 2019-05-13 DIAGNOSIS — Z96652 Presence of left artificial knee joint: Secondary | ICD-10-CM | POA: Diagnosis present

## 2019-05-13 DIAGNOSIS — Z79899 Other long term (current) drug therapy: Secondary | ICD-10-CM

## 2019-05-13 DIAGNOSIS — M1990 Unspecified osteoarthritis, unspecified site: Secondary | ICD-10-CM | POA: Diagnosis not present

## 2019-05-13 DIAGNOSIS — Z8616 Personal history of COVID-19: Secondary | ICD-10-CM | POA: Diagnosis not present

## 2019-05-13 DIAGNOSIS — I129 Hypertensive chronic kidney disease with stage 1 through stage 4 chronic kidney disease, or unspecified chronic kidney disease: Secondary | ICD-10-CM | POA: Diagnosis present

## 2019-05-13 DIAGNOSIS — Z4682 Encounter for fitting and adjustment of non-vascular catheter: Secondary | ICD-10-CM | POA: Diagnosis not present

## 2019-05-13 DIAGNOSIS — Z7982 Long term (current) use of aspirin: Secondary | ICD-10-CM | POA: Diagnosis not present

## 2019-05-13 DIAGNOSIS — Z902 Acquired absence of lung [part of]: Secondary | ICD-10-CM

## 2019-05-13 DIAGNOSIS — I255 Ischemic cardiomyopathy: Secondary | ICD-10-CM | POA: Diagnosis present

## 2019-05-13 DIAGNOSIS — E1122 Type 2 diabetes mellitus with diabetic chronic kidney disease: Secondary | ICD-10-CM | POA: Diagnosis present

## 2019-05-13 DIAGNOSIS — R001 Bradycardia, unspecified: Secondary | ICD-10-CM | POA: Diagnosis not present

## 2019-05-13 DIAGNOSIS — I25119 Atherosclerotic heart disease of native coronary artery with unspecified angina pectoris: Secondary | ICD-10-CM | POA: Diagnosis not present

## 2019-05-13 DIAGNOSIS — F419 Anxiety disorder, unspecified: Secondary | ICD-10-CM | POA: Diagnosis present

## 2019-05-13 DIAGNOSIS — R911 Solitary pulmonary nodule: Secondary | ICD-10-CM | POA: Diagnosis not present

## 2019-05-13 DIAGNOSIS — I700 Atherosclerosis of aorta: Secondary | ICD-10-CM | POA: Diagnosis present

## 2019-05-13 DIAGNOSIS — C3411 Malignant neoplasm of upper lobe, right bronchus or lung: Secondary | ICD-10-CM | POA: Diagnosis not present

## 2019-05-13 DIAGNOSIS — Z7984 Long term (current) use of oral hypoglycemic drugs: Secondary | ICD-10-CM | POA: Diagnosis not present

## 2019-05-13 DIAGNOSIS — K219 Gastro-esophageal reflux disease without esophagitis: Secondary | ICD-10-CM | POA: Diagnosis not present

## 2019-05-13 DIAGNOSIS — J939 Pneumothorax, unspecified: Secondary | ICD-10-CM | POA: Diagnosis not present

## 2019-05-13 DIAGNOSIS — I2510 Atherosclerotic heart disease of native coronary artery without angina pectoris: Secondary | ICD-10-CM | POA: Diagnosis present

## 2019-05-13 DIAGNOSIS — D62 Acute posthemorrhagic anemia: Secondary | ICD-10-CM | POA: Diagnosis not present

## 2019-05-13 DIAGNOSIS — I340 Nonrheumatic mitral (valve) insufficiency: Secondary | ICD-10-CM | POA: Diagnosis present

## 2019-05-13 DIAGNOSIS — Z886 Allergy status to analgesic agent status: Secondary | ICD-10-CM

## 2019-05-13 DIAGNOSIS — I959 Hypotension, unspecified: Secondary | ICD-10-CM | POA: Diagnosis present

## 2019-05-13 DIAGNOSIS — Z87891 Personal history of nicotine dependence: Secondary | ICD-10-CM

## 2019-05-13 DIAGNOSIS — J9811 Atelectasis: Secondary | ICD-10-CM | POA: Diagnosis not present

## 2019-05-13 DIAGNOSIS — Z888 Allergy status to other drugs, medicaments and biological substances status: Secondary | ICD-10-CM

## 2019-05-13 DIAGNOSIS — I214 Non-ST elevation (NSTEMI) myocardial infarction: Secondary | ICD-10-CM | POA: Diagnosis not present

## 2019-05-13 DIAGNOSIS — I25110 Atherosclerotic heart disease of native coronary artery with unstable angina pectoris: Secondary | ICD-10-CM | POA: Diagnosis not present

## 2019-05-13 HISTORY — PX: LYMPH NODE DISSECTION: SHX5087

## 2019-05-13 HISTORY — PX: INTERCOSTAL NERVE BLOCK: SHX5021

## 2019-05-13 LAB — POCT I-STAT 7, (LYTES, BLD GAS, ICA,H+H)
Acid-base deficit: 5 mmol/L — ABNORMAL HIGH (ref 0.0–2.0)
Bicarbonate: 21.3 mmol/L (ref 20.0–28.0)
Calcium, Ion: 1.2 mmol/L (ref 1.15–1.40)
HCT: 23 % — ABNORMAL LOW (ref 36.0–46.0)
Hemoglobin: 7.8 g/dL — ABNORMAL LOW (ref 12.0–15.0)
O2 Saturation: 94 %
Potassium: 4.8 mmol/L (ref 3.5–5.1)
Sodium: 142 mmol/L (ref 135–145)
TCO2: 23 mmol/L (ref 22–32)
pCO2 arterial: 44.8 mmHg (ref 32.0–48.0)
pH, Arterial: 7.285 — ABNORMAL LOW (ref 7.350–7.450)
pO2, Arterial: 82 mmHg — ABNORMAL LOW (ref 83.0–108.0)

## 2019-05-13 LAB — POCT I-STAT, CHEM 8
BUN: 20 mg/dL (ref 8–23)
Calcium, Ion: 1.23 mmol/L (ref 1.15–1.40)
Chloride: 106 mmol/L (ref 98–111)
Creatinine, Ser: 1.3 mg/dL — ABNORMAL HIGH (ref 0.44–1.00)
Glucose, Bld: 212 mg/dL — ABNORMAL HIGH (ref 70–99)
HCT: 24 % — ABNORMAL LOW (ref 36.0–46.0)
Hemoglobin: 8.2 g/dL — ABNORMAL LOW (ref 12.0–15.0)
Potassium: 5.2 mmol/L — ABNORMAL HIGH (ref 3.5–5.1)
Sodium: 139 mmol/L (ref 135–145)
TCO2: 23 mmol/L (ref 22–32)

## 2019-05-13 LAB — CBC
HCT: 24.9 % — ABNORMAL LOW (ref 36.0–46.0)
Hemoglobin: 7.5 g/dL — ABNORMAL LOW (ref 12.0–15.0)
MCH: 27.3 pg (ref 26.0–34.0)
MCHC: 30.1 g/dL (ref 30.0–36.0)
MCV: 90.5 fL (ref 80.0–100.0)
Platelets: 249 10*3/uL (ref 150–400)
RBC: 2.75 MIL/uL — ABNORMAL LOW (ref 3.87–5.11)
RDW: 18.6 % — ABNORMAL HIGH (ref 11.5–15.5)
WBC: 10.8 10*3/uL — ABNORMAL HIGH (ref 4.0–10.5)
nRBC: 0 % (ref 0.0–0.2)

## 2019-05-13 LAB — GLUCOSE, CAPILLARY
Glucose-Capillary: 133 mg/dL — ABNORMAL HIGH (ref 70–99)
Glucose-Capillary: 161 mg/dL — ABNORMAL HIGH (ref 70–99)
Glucose-Capillary: 202 mg/dL — ABNORMAL HIGH (ref 70–99)
Glucose-Capillary: 152 mg/dL — ABNORMAL HIGH (ref 70–99)

## 2019-05-13 LAB — PREPARE RBC (CROSSMATCH)

## 2019-05-13 SURGERY — LOBECTOMY, LUNG, ROBOT-ASSISTED, USING VATS
Anesthesia: General | Site: Chest | Laterality: Right

## 2019-05-13 MED ORDER — ATROPINE SULFATE 0.4 MG/ML IJ SOLN
INTRAMUSCULAR | Status: DC | PRN
  Administered 2019-05-13 (×3): .2 mg via INTRAVENOUS

## 2019-05-13 MED ORDER — TIZANIDINE HCL 4 MG PO TABS: 4.0000 mg | ORAL_TABLET | Freq: Four times a day (QID) | ORAL | Status: DC | PRN

## 2019-05-13 MED ORDER — 0.9 % SODIUM CHLORIDE (POUR BTL) OPTIME
TOPICAL | Status: DC | PRN
  Administered 2019-05-13: 3000 mL

## 2019-05-13 MED ORDER — METOCLOPRAMIDE HCL 5 MG/ML IJ SOLN
10.0000 mg | Freq: Four times a day (QID) | INTRAMUSCULAR | Status: DC
  Administered 2019-05-13 – 2019-05-15 (×6): 10 mg via INTRAVENOUS
  Filled 2019-05-13 (×7): qty 2

## 2019-05-13 MED ORDER — CEFAZOLIN SODIUM-DEXTROSE 2-4 GM/100ML-% IV SOLN
2.0000 g | INTRAVENOUS | Status: AC
  Administered 2019-05-13: 2 g via INTRAVENOUS
  Filled 2019-05-13: qty 100

## 2019-05-13 MED ORDER — GLYCOPYRROLATE PF 0.2 MG/ML IJ SOSY
PREFILLED_SYRINGE | INTRAMUSCULAR | Status: AC
  Filled 2019-05-13: qty 2

## 2019-05-13 MED ORDER — ENOXAPARIN SODIUM 40 MG/0.4ML ~~LOC~~ SOLN
40.0000 mg | Freq: Every day | SUBCUTANEOUS | Status: DC
  Administered 2019-05-13 – 2019-05-15 (×3): 40 mg via SUBCUTANEOUS
  Filled 2019-05-13 (×3): qty 0.4

## 2019-05-13 MED ORDER — MORPHINE SULFATE 2 MG/ML IV SOLN
INTRAVENOUS | Status: DC
  Filled 2019-05-13: qty 30

## 2019-05-13 MED ORDER — ASPIRIN EC 81 MG PO TBEC
81.0000 mg | DELAYED_RELEASE_TABLET | Freq: Every day | ORAL | Status: DC
  Administered 2019-05-14 – 2019-05-16 (×3): 81 mg via ORAL
  Filled 2019-05-13 (×3): qty 1

## 2019-05-13 MED ORDER — FENTANYL CITRATE (PF) 250 MCG/5ML IJ SOLN
INTRAMUSCULAR | Status: AC
  Filled 2019-05-13: qty 5

## 2019-05-13 MED ORDER — BISACODYL 5 MG PO TBEC
10.0000 mg | DELAYED_RELEASE_TABLET | Freq: Every day | ORAL | Status: DC
  Administered 2019-05-13 – 2019-05-15 (×3): 10 mg via ORAL
  Filled 2019-05-13 (×3): qty 2

## 2019-05-13 MED ORDER — FENTANYL CITRATE (PF) 100 MCG/2ML IJ SOLN
INTRAMUSCULAR | Status: AC
  Filled 2019-05-13: qty 2

## 2019-05-13 MED ORDER — BUPIVACAINE LIPOSOME 1.3 % IJ SUSP
INTRAMUSCULAR | Status: DC | PRN
  Administered 2019-05-13: 100 mL

## 2019-05-13 MED ORDER — CEFAZOLIN SODIUM-DEXTROSE 2-4 GM/100ML-% IV SOLN
2.0000 g | Freq: Three times a day (TID) | INTRAVENOUS | Status: AC
  Administered 2019-05-13 (×2): 2 g via INTRAVENOUS
  Filled 2019-05-13 (×2): qty 100

## 2019-05-13 MED ORDER — PNEUMOCOCCAL VAC POLYVALENT 25 MCG/0.5ML IJ INJ
0.5000 mL | INJECTION | INTRAMUSCULAR | Status: AC
  Administered 2019-05-15: 0.5 mL via INTRAMUSCULAR
  Filled 2019-05-13: qty 0.5

## 2019-05-13 MED ORDER — LACTATED RINGERS IV SOLN: INTRAVENOUS | Status: DC | PRN

## 2019-05-13 MED ORDER — ONDANSETRON HCL 4 MG/2ML IJ SOLN
INTRAMUSCULAR | Status: DC | PRN
  Administered 2019-05-13 (×2): 4 mg via INTRAVENOUS

## 2019-05-13 MED ORDER — PROPOFOL 10 MG/ML IV BOLUS
INTRAVENOUS | Status: DC | PRN
  Administered 2019-05-13: 150 mg via INTRAVENOUS

## 2019-05-13 MED ORDER — ONDANSETRON HCL 4 MG/2ML IJ SOLN: 4.0000 mg | Freq: Four times a day (QID) | INTRAMUSCULAR | Status: DC | PRN

## 2019-05-13 MED ORDER — LATANOPROST 0.005 % OP SOLN
1.0000 [drp] | Freq: Every day | OPHTHALMIC | Status: DC
  Administered 2019-05-13 – 2019-05-15 (×3): 1 [drp] via OPHTHALMIC
  Filled 2019-05-13: qty 2.5

## 2019-05-13 MED ORDER — DIPHENHYDRAMINE HCL 12.5 MG/5ML PO ELIX: 12.5000 mg | ORAL_SOLUTION | Freq: Four times a day (QID) | ORAL | Status: DC | PRN

## 2019-05-13 MED ORDER — CHLORTHALIDONE 25 MG PO TABS
25.0000 mg | ORAL_TABLET | Freq: Two times a day (BID) | ORAL | Status: DC
  Administered 2019-05-14 – 2019-05-16 (×5): 25 mg via ORAL
  Filled 2019-05-13 (×6): qty 1

## 2019-05-13 MED ORDER — DIPHENHYDRAMINE HCL 50 MG/ML IJ SOLN: 12.5000 mg | Freq: Four times a day (QID) | INTRAMUSCULAR | Status: DC | PRN

## 2019-05-13 MED ORDER — INSULIN ASPART 100 UNIT/ML ~~LOC~~ SOLN
0.0000 [IU] | Freq: Three times a day (TID) | SUBCUTANEOUS | Status: DC
  Administered 2019-05-13: 5 [IU] via SUBCUTANEOUS
  Administered 2019-05-14 (×3): 3 [IU] via SUBCUTANEOUS
  Administered 2019-05-15: 2 [IU] via SUBCUTANEOUS
  Administered 2019-05-15: 8 [IU] via SUBCUTANEOUS
  Administered 2019-05-16: 3 [IU] via SUBCUTANEOUS

## 2019-05-13 MED ORDER — SODIUM CHLORIDE 0.9% FLUSH: 9.0000 mL | INTRAVENOUS | Status: DC | PRN

## 2019-05-13 MED ORDER — PHENYLEPHRINE HCL-NACL 10-0.9 MG/250ML-% IV SOLN
INTRAVENOUS | Status: DC | PRN
  Administered 2019-05-13: 15 ug/min via INTRAVENOUS

## 2019-05-13 MED ORDER — FENTANYL CITRATE (PF) 100 MCG/2ML IJ SOLN
25.0000 ug | INTRAMUSCULAR | Status: DC | PRN
  Administered 2019-05-13: 25 ug via INTRAVENOUS
  Filled 2019-05-13 (×2): qty 2

## 2019-05-13 MED ORDER — IPRATROPIUM-ALBUTEROL 0.5-2.5 (3) MG/3ML IN SOLN: 3.0000 mL | Freq: Four times a day (QID) | RESPIRATORY_TRACT | Status: DC | PRN

## 2019-05-13 MED ORDER — SODIUM CHLORIDE 0.9 % IR SOLN
Status: DC | PRN
  Administered 2019-05-13: 1000 mL

## 2019-05-13 MED ORDER — SENNOSIDES-DOCUSATE SODIUM 8.6-50 MG PO TABS
1.0000 | ORAL_TABLET | Freq: Every day | ORAL | Status: DC
  Administered 2019-05-13 – 2019-05-14 (×2): 1 via ORAL
  Filled 2019-05-13 (×3): qty 1

## 2019-05-13 MED ORDER — MAGNESIUM OXIDE 400 (241.3 MG) MG PO TABS
400.0000 mg | ORAL_TABLET | Freq: Every day | ORAL | Status: DC
  Administered 2019-05-13 – 2019-05-16 (×4): 400 mg via ORAL
  Filled 2019-05-13 (×5): qty 1

## 2019-05-13 MED ORDER — BUPIVACAINE HCL (PF) 0.5 % IJ SOLN
INTRAMUSCULAR | Status: AC
  Filled 2019-05-13: qty 30

## 2019-05-13 MED ORDER — MORPHINE SULFATE 2 MG/ML IV SOLN
INTRAVENOUS | Status: AC
  Filled 2019-05-13: qty 30

## 2019-05-13 MED ORDER — ONDANSETRON HCL 4 MG/2ML IJ SOLN
INTRAMUSCULAR | Status: AC
  Filled 2019-05-13: qty 2

## 2019-05-13 MED ORDER — ACETAMINOPHEN 500 MG PO TABS
1000.0000 mg | ORAL_TABLET | Freq: Four times a day (QID) | ORAL | Status: DC
  Administered 2019-05-13 – 2019-05-16 (×11): 1000 mg via ORAL
  Filled 2019-05-13 (×11): qty 2

## 2019-05-13 MED ORDER — FENTANYL CITRATE (PF) 250 MCG/5ML IJ SOLN
INTRAMUSCULAR | Status: DC | PRN
  Administered 2019-05-13 (×3): 50 ug via INTRAVENOUS

## 2019-05-13 MED ORDER — SODIUM CHLORIDE 0.9% IV SOLUTION: Freq: Once | INTRAVENOUS | Status: DC

## 2019-05-13 MED ORDER — ONDANSETRON HCL 4 MG/2ML IJ SOLN
4.0000 mg | Freq: Four times a day (QID) | INTRAMUSCULAR | Status: AC | PRN
  Administered 2019-05-13: 4 mg via INTRAVENOUS

## 2019-05-13 MED ORDER — MECLIZINE HCL 25 MG PO TABS
25.0000 mg | ORAL_TABLET | Freq: Three times a day (TID) | ORAL | Status: DC | PRN
  Filled 2019-05-13: qty 1

## 2019-05-13 MED ORDER — NALOXONE HCL 0.4 MG/ML IJ SOLN: 0.4000 mg | INTRAMUSCULAR | Status: DC | PRN

## 2019-05-13 MED ORDER — AMLODIPINE BESYLATE 10 MG PO TABS
10.0000 mg | ORAL_TABLET | Freq: Every day | ORAL | Status: DC
  Administered 2019-05-14 – 2019-05-16 (×3): 10 mg via ORAL
  Filled 2019-05-13 (×3): qty 1

## 2019-05-13 MED ORDER — SODIUM CHLORIDE 0.9 % IV SOLN: 250.0000 mL | INTRAVENOUS | Status: DC

## 2019-05-13 MED ORDER — PHENYLEPHRINE HCL-NACL 10-0.9 MG/250ML-% IV SOLN
25.0000 ug/min | INTRAVENOUS | Status: DC
  Administered 2019-05-13: 50 ug/min via INTRAVENOUS
  Filled 2019-05-13: qty 250

## 2019-05-13 MED ORDER — SODIUM CHLORIDE 0.9 % IV SOLN: INTRAVENOUS | Status: DC | PRN

## 2019-05-13 MED ORDER — BUPIVACAINE LIPOSOME 1.3 % IJ SUSP
20.0000 mL | Freq: Once | INTRAMUSCULAR | Status: DC
  Filled 2019-05-13: qty 20

## 2019-05-13 MED ORDER — GLYCOPYRROLATE PF 0.2 MG/ML IJ SOSY
PREFILLED_SYRINGE | INTRAMUSCULAR | Status: DC | PRN
  Administered 2019-05-13 (×4): .2 mg via INTRAVENOUS

## 2019-05-13 MED ORDER — FENTANYL CITRATE (PF) 100 MCG/2ML IJ SOLN
25.0000 ug | INTRAMUSCULAR | Status: DC | PRN
  Administered 2019-05-13: 25 ug via INTRAVENOUS

## 2019-05-13 MED ORDER — INSULIN ASPART 100 UNIT/ML ~~LOC~~ SOLN: 0.0000 [IU] | SUBCUTANEOUS | Status: DC

## 2019-05-13 MED ORDER — CHLORHEXIDINE GLUCONATE CLOTH 2 % EX PADS
6.0000 | MEDICATED_PAD | Freq: Every day | CUTANEOUS | Status: DC
  Administered 2019-05-14 – 2019-05-15 (×2): 6 via TOPICAL

## 2019-05-13 MED ORDER — TRAMADOL HCL 50 MG PO TABS: 50.0000 mg | ORAL_TABLET | Freq: Four times a day (QID) | ORAL | Status: DC | PRN

## 2019-05-13 MED ORDER — ACETAMINOPHEN 160 MG/5ML PO SOLN: 1000.0000 mg | Freq: Four times a day (QID) | ORAL | Status: DC

## 2019-05-13 MED ORDER — PROPOFOL 10 MG/ML IV BOLUS
INTRAVENOUS | Status: AC
  Filled 2019-05-13: qty 20

## 2019-05-13 MED ORDER — OXYCODONE HCL 5 MG PO TABS
5.0000 mg | ORAL_TABLET | ORAL | Status: DC | PRN
  Administered 2019-05-13: 5 mg via ORAL
  Filled 2019-05-13: qty 1

## 2019-05-13 MED ORDER — ROCURONIUM BROMIDE 10 MG/ML (PF) SYRINGE
PREFILLED_SYRINGE | INTRAVENOUS | Status: AC
  Filled 2019-05-13: qty 20

## 2019-05-13 MED ORDER — MIDAZOLAM HCL 2 MG/2ML IJ SOLN
INTRAMUSCULAR | Status: AC
  Filled 2019-05-13: qty 2

## 2019-05-13 MED ORDER — SODIUM CHLORIDE 0.9 % IV SOLN: INTRAVENOUS | Status: DC

## 2019-05-13 MED ORDER — SUGAMMADEX SODIUM 200 MG/2ML IV SOLN
INTRAVENOUS | Status: DC | PRN
  Administered 2019-05-13: 30 mg via INTRAVENOUS
  Administered 2019-05-13: 170 mg via INTRAVENOUS

## 2019-05-13 MED ORDER — OXYCODONE HCL 5 MG/5ML PO SOLN: 5.0000 mg | Freq: Once | ORAL | Status: DC | PRN

## 2019-05-13 MED ORDER — ALPRAZOLAM 0.5 MG PO TABS
0.5000 mg | ORAL_TABLET | Freq: Two times a day (BID) | ORAL | Status: DC
  Administered 2019-05-13 – 2019-05-16 (×6): 0.5 mg via ORAL
  Filled 2019-05-13 (×6): qty 1

## 2019-05-13 MED ORDER — IPRATROPIUM-ALBUTEROL 0.5-2.5 (3) MG/3ML IN SOLN: 3.0000 mL | Freq: Four times a day (QID) | RESPIRATORY_TRACT | Status: DC

## 2019-05-13 MED ORDER — PANTOPRAZOLE SODIUM 40 MG PO TBEC
80.0000 mg | DELAYED_RELEASE_TABLET | Freq: Every day | ORAL | Status: DC
  Administered 2019-05-14 – 2019-05-16 (×3): 80 mg via ORAL
  Filled 2019-05-13 (×3): qty 2

## 2019-05-13 MED ORDER — ATORVASTATIN CALCIUM 80 MG PO TABS
80.0000 mg | ORAL_TABLET | Freq: Every day | ORAL | Status: DC
  Administered 2019-05-13 – 2019-05-15 (×3): 80 mg via ORAL
  Filled 2019-05-13 (×3): qty 1

## 2019-05-13 MED ORDER — HEMOSTATIC AGENTS (NO CHARGE) OPTIME
TOPICAL | Status: DC | PRN
  Administered 2019-05-13 (×3): 1 via TOPICAL

## 2019-05-13 MED ORDER — MIDAZOLAM HCL 5 MG/5ML IJ SOLN
INTRAMUSCULAR | Status: DC | PRN
  Administered 2019-05-13 (×2): 1 mg via INTRAVENOUS

## 2019-05-13 MED ORDER — EPHEDRINE SULFATE 50 MG/ML IJ SOLN
INTRAMUSCULAR | Status: DC | PRN
  Administered 2019-05-13 (×3): 5 mg via INTRAVENOUS
  Administered 2019-05-13 (×3): 10 mg via INTRAVENOUS
  Administered 2019-05-13 (×2): 5 mg via INTRAVENOUS
  Administered 2019-05-13: 10 mg via INTRAVENOUS
  Administered 2019-05-13: 5 mg via INTRAVENOUS

## 2019-05-13 MED ORDER — ROCURONIUM BROMIDE 50 MG/5ML IV SOSY
PREFILLED_SYRINGE | INTRAVENOUS | Status: DC | PRN
  Administered 2019-05-13: 80 mg via INTRAVENOUS
  Administered 2019-05-13 (×2): 20 mg via INTRAVENOUS
  Administered 2019-05-13: 10 mg via INTRAVENOUS

## 2019-05-13 MED ORDER — OXYCODONE HCL 5 MG PO TABS: 5.0000 mg | ORAL_TABLET | Freq: Once | ORAL | Status: DC | PRN

## 2019-05-13 MED ORDER — PHENYLEPHRINE HCL-NACL 10-0.9 MG/250ML-% IV SOLN
0.0000 ug/min | INTRAVENOUS | Status: DC
  Administered 2019-05-13: 20 ug/min via INTRAVENOUS

## 2019-05-13 SURGICAL SUPPLY — 150 items
ADH SKN CLS APL DERMABOND .7 (GAUZE/BANDAGES/DRESSINGS) ×1
ADH SKN CLS LQ APL DERMABOND (GAUZE/BANDAGES/DRESSINGS) ×1
ANCHOR TIS RET SYS 1550ML (BAG) ×1 IMPLANT
APPLIER CLIP ROT 10 11.4 M/L (STAPLE)
APR CLP MED LRG 11.4X10 (STAPLE)
BAG SPEC RTRVL 10 TROC 200 (ENDOMECHANICALS) ×1
BAG SPEC RTRVL C1550 25.4 (BAG) ×1
BAG SPEC RTRVL LRG 6X4 10 (ENDOMECHANICALS)
BLADE CLIPPER SURG (BLADE) ×2 IMPLANT
BLADE SURG SZ11 CARB STEEL (BLADE) ×2 IMPLANT
BNDG COHESIVE 4X5 TAN STRL (GAUZE/BANDAGES/DRESSINGS) ×2 IMPLANT
BNDG COHESIVE 6X5 TAN STRL LF (GAUZE/BANDAGES/DRESSINGS) ×2 IMPLANT
CANISTER SUCT 3000ML PPV (MISCELLANEOUS) ×4 IMPLANT
CANNULA REDUC XI 12-8 STAPL (CANNULA) ×2
CANNULA REDUCER 12-8 DVNC XI (CANNULA) ×2 IMPLANT
CATH THORACIC 28FR (CATHETERS) ×1 IMPLANT
CATH THORACIC 28FR RT ANG (CATHETERS) IMPLANT
CATH THORACIC 36FR (CATHETERS) IMPLANT
CATH THORACIC 36FR RT ANG (CATHETERS) IMPLANT
CLIP APPLIE ROT 10 11.4 M/L (STAPLE) IMPLANT
CLIP VESOCCLUDE MED 6/CT (CLIP) IMPLANT
CNTNR URN SCR LID CUP LEK RST (MISCELLANEOUS) IMPLANT
CONN ST 1/4X3/8  BEN (MISCELLANEOUS)
CONN ST 1/4X3/8 BEN (MISCELLANEOUS) IMPLANT
CONN Y 3/8X3/8X3/8  BEN (MISCELLANEOUS)
CONN Y 3/8X3/8X3/8 BEN (MISCELLANEOUS) IMPLANT
CONT SPEC 4OZ CLIKSEAL STRL BL (MISCELLANEOUS) ×10 IMPLANT
CONT SPEC 4OZ STRL OR WHT (MISCELLANEOUS) ×30
COVER SURGICAL LIGHT HANDLE (MISCELLANEOUS) IMPLANT
DEFOGGER SCOPE WARMER CLEARIFY (MISCELLANEOUS) ×2 IMPLANT
DERMABOND ADHESIVE PROPEN (GAUZE/BANDAGES/DRESSINGS) ×1
DERMABOND ADVANCED (GAUZE/BANDAGES/DRESSINGS) ×1
DERMABOND ADVANCED .7 DNX12 (GAUZE/BANDAGES/DRESSINGS) ×1 IMPLANT
DERMABOND ADVANCED .7 DNX6 (GAUZE/BANDAGES/DRESSINGS) IMPLANT
DRAIN CHANNEL 28F RND 3/8 FF (WOUND CARE) IMPLANT
DRAIN CHANNEL 32F RND 10.7 FF (WOUND CARE) IMPLANT
DRAPE ARM DVNC X/XI (DISPOSABLE) ×4 IMPLANT
DRAPE COLUMN DVNC XI (DISPOSABLE) ×1 IMPLANT
DRAPE CV SPLIT W-CLR ANES SCRN (DRAPES) ×2 IMPLANT
DRAPE DA VINCI XI ARM (DISPOSABLE) ×4
DRAPE DA VINCI XI COLUMN (DISPOSABLE) ×1
DRAPE INCISE IOBAN 66X45 STRL (DRAPES) IMPLANT
DRAPE ORTHO SPLIT 77X108 STRL (DRAPES) ×2
DRAPE SURG ORHT 6 SPLT 77X108 (DRAPES) ×1 IMPLANT
DRAPE WARM FLUID 44X44 (DRAPES) IMPLANT
ELECT BLADE 6.5 EXT (BLADE) IMPLANT
ELECT REM PT RETURN 9FT ADLT (ELECTROSURGICAL) ×2
ELECTRODE REM PT RTRN 9FT ADLT (ELECTROSURGICAL) ×1 IMPLANT
GAUZE KITTNER 4X10 (MISCELLANEOUS) ×1 IMPLANT
GAUZE KITTNER 4X8 (MISCELLANEOUS) ×5 IMPLANT
GAUZE SPONGE 4X4 12PLY STRL (GAUZE/BANDAGES/DRESSINGS) ×2 IMPLANT
GLOVE BIO SURGEON STRL SZ7 (GLOVE) ×4 IMPLANT
GLOVE BIOGEL M 6.5 STRL (GLOVE) ×2 IMPLANT
GLOVE BIOGEL PI IND STRL 6.5 (GLOVE) IMPLANT
GLOVE BIOGEL PI INDICATOR 6.5 (GLOVE) ×2
GLOVE SURG SIGNA 7.5 PF LTX (GLOVE) ×4 IMPLANT
GLOVE TRIUMPH SURG SIZE 7.5 (KITS) ×4 IMPLANT
GOWN STRL REUS W/ TWL LRG LVL3 (GOWN DISPOSABLE) ×2 IMPLANT
GOWN STRL REUS W/ TWL XL LVL3 (GOWN DISPOSABLE) ×4 IMPLANT
GOWN STRL REUS W/TWL LRG LVL3 (GOWN DISPOSABLE) ×6
GOWN STRL REUS W/TWL XL LVL3 (GOWN DISPOSABLE) ×6
HEMOSTAT SURGICEL 2X14 (HEMOSTASIS) ×8 IMPLANT
IRRIGATION STRYKERFLOW (MISCELLANEOUS) IMPLANT
IRRIGATOR STRYKERFLOW (MISCELLANEOUS)
IRRIGATOR SUCT 8 DISP DVNC XI (IRRIGATION / IRRIGATOR) IMPLANT
IRRIGATOR SUCTION 8MM XI DISP (IRRIGATION / IRRIGATOR)
KIT BASIN OR (CUSTOM PROCEDURE TRAY) ×2 IMPLANT
KIT SUCTION CATH 14FR (SUCTIONS) IMPLANT
KIT TURNOVER KIT B (KITS) ×2 IMPLANT
LOOP VESSEL SUPERMAXI WHITE (MISCELLANEOUS) IMPLANT
NDL HYPO 25GX1X1/2 BEV (NEEDLE) ×1 IMPLANT
NDL SPNL 18GX3.5 QUINCKE PK (NEEDLE) IMPLANT
NEEDLE HYPO 25GX1X1/2 BEV (NEEDLE) ×2 IMPLANT
NEEDLE SPNL 18GX3.5 QUINCKE PK (NEEDLE) ×2 IMPLANT
NS IRRIG 1000ML POUR BTL (IV SOLUTION) ×4 IMPLANT
PACK CHEST (CUSTOM PROCEDURE TRAY) ×2 IMPLANT
PAD ARMBOARD 7.5X6 YLW CONV (MISCELLANEOUS) ×4 IMPLANT
PATTIES SURGICAL 3 X3 (GAUZE/BANDAGES/DRESSINGS)
PATTIES SURGICAL 3X3 (GAUZE/BANDAGES/DRESSINGS) IMPLANT
POUCH ENDO CATCH II 15MM (MISCELLANEOUS) IMPLANT
POUCH RETRIEVAL ECOSAC 10 (ENDOMECHANICALS) ×1 IMPLANT
POUCH RETRIEVAL ECOSAC 10MM (ENDOMECHANICALS) ×1
POUCH SPECIMEN RETRIEVAL 10MM (ENDOMECHANICALS) IMPLANT
RELOAD STAPLE 45 2.5 WHT DVNC (STAPLE) IMPLANT
RELOAD STAPLE 45 3.5 BLU DVNC (STAPLE) IMPLANT
RELOAD STAPLE 45 3.5 BLU ETS (ENDOMECHANICALS) IMPLANT
RELOAD STAPLE 45 4.3 GRN DVNC (STAPLE) IMPLANT
RELOAD STAPLE TA45 3.5 REG BLU (ENDOMECHANICALS) ×2 IMPLANT
RELOAD STAPLER 2.5X45 WHT DVNC (STAPLE) ×7 IMPLANT
RELOAD STAPLER 3.5X45 BLU DVNC (STAPLE) ×5 IMPLANT
RELOAD STAPLER 4.3X45 GRN DVNC (STAPLE) ×1 IMPLANT
SCISSORS LAP 5X35 DISP (ENDOMECHANICALS) IMPLANT
SEAL CANN UNIV 5-8 DVNC XI (MISCELLANEOUS) ×2 IMPLANT
SEAL XI 5MM-8MM UNIVERSAL (MISCELLANEOUS) ×2
SEALANT PROGEL (MISCELLANEOUS) IMPLANT
SEALANT SURG COSEAL 4ML (VASCULAR PRODUCTS) IMPLANT
SEALANT SURG COSEAL 8ML (VASCULAR PRODUCTS) IMPLANT
SET TUBE SMOKE EVAC HIGH FLOW (TUBING) ×2 IMPLANT
SHEARS HARMONIC HDI 20CM (ELECTROSURGICAL) IMPLANT
SHEET MEDIUM DRAPE 40X70 STRL (DRAPES) ×2 IMPLANT
SOL ANTI FOG 6CC (MISCELLANEOUS) IMPLANT
SOLUTION ANTI FOG 6CC (MISCELLANEOUS)
SPECIMEN JAR MEDIUM (MISCELLANEOUS) IMPLANT
SPONGE INTESTINAL PEANUT (DISPOSABLE) IMPLANT
SPONGE TONSIL TAPE 1 RFD (DISPOSABLE) IMPLANT
STAPLER 45 SUREFORM CVD (STAPLE) ×1
STAPLER 45 SUREFORM CVD DVNC (STAPLE) IMPLANT
STAPLER CANNULA SEAL DVNC XI (STAPLE) ×2 IMPLANT
STAPLER CANNULA SEAL XI (STAPLE) ×2
STAPLER ENDO NO KNIFE (STAPLE) ×1 IMPLANT
STAPLER RELOAD 2.5X45 WHITE (STAPLE) ×7
STAPLER RELOAD 2.5X45 WHT DVNC (STAPLE) ×7
STAPLER RELOAD 3.5X45 BLU DVNC (STAPLE) ×5
STAPLER RELOAD 3.5X45 BLUE (STAPLE) ×5
STAPLER RELOAD 4.3X45 GREEN (STAPLE) ×1
STAPLER RELOAD 4.3X45 GRN DVNC (STAPLE) ×1
STOPCOCK 4 WAY LG BORE MALE ST (IV SETS) ×2 IMPLANT
SUT PDS AB 3-0 SH 27 (SUTURE) IMPLANT
SUT PROLENE 4 0 RB 1 (SUTURE)
SUT PROLENE 4-0 RB1 .5 CRCL 36 (SUTURE) IMPLANT
SUT SILK  1 MH (SUTURE) ×2
SUT SILK 1 MH (SUTURE) ×2 IMPLANT
SUT SILK 1 TIES 10X30 (SUTURE) ×2 IMPLANT
SUT SILK 2 0 SH (SUTURE) IMPLANT
SUT SILK 2 0SH CR/8 30 (SUTURE) IMPLANT
SUT SILK 3 0 SH 30 (SUTURE) IMPLANT
SUT SILK 3 0SH CR/8 30 (SUTURE) IMPLANT
SUT VIC AB 1 CTX 36 (SUTURE) ×2
SUT VIC AB 1 CTX36XBRD ANBCTR (SUTURE) IMPLANT
SUT VIC AB 2-0 CTX 36 (SUTURE) ×1 IMPLANT
SUT VIC AB 3-0 MH 27 (SUTURE) IMPLANT
SUT VIC AB 3-0 X1 27 (SUTURE) ×2 IMPLANT
SUT VICRYL 0 TIES 12 18 (SUTURE) ×2 IMPLANT
SUT VICRYL 0 UR6 27IN ABS (SUTURE) ×4 IMPLANT
SUT VICRYL 2 TP 1 (SUTURE) IMPLANT
SYR 10ML LL (SYRINGE) ×2 IMPLANT
SYR 20ML ECCENTRIC (SYRINGE) ×2 IMPLANT
SYR 30ML LL (SYRINGE) ×2 IMPLANT
SYR 50ML LL SCALE MARK (SYRINGE) ×2 IMPLANT
SYSTEM SAHARA CHEST DRAIN ATS (WOUND CARE) ×2 IMPLANT
TAPE CLOTH 4X10 WHT NS (GAUZE/BANDAGES/DRESSINGS) ×2 IMPLANT
TAPE CLOTH SURG 4X10 WHT LF (GAUZE/BANDAGES/DRESSINGS) ×1 IMPLANT
TIP APPLICATOR SPRAY EXTEND 16 (VASCULAR PRODUCTS) IMPLANT
TOWEL GREEN STERILE (TOWEL DISPOSABLE) ×2 IMPLANT
TOWEL GREEN STERILE FF (TOWEL DISPOSABLE) IMPLANT
TRAY FOLEY MTR SLVR 16FR STAT (SET/KITS/TRAYS/PACK) ×2 IMPLANT
TROCAR FLEXIPATH THORACIC 15MM (ENDOMECHANICALS) ×1 IMPLANT
TROCAR XCEL 12X100 BLDLESS (ENDOMECHANICALS) ×2 IMPLANT
TUBING EXTENTION W/L.L. (IV SETS) ×2 IMPLANT
WATER STERILE IRR 1000ML POUR (IV SOLUTION) ×2 IMPLANT

## 2019-05-13 NOTE — Anesthesia Procedure Notes (Signed)
Procedure Name: Intubation Date/Time: 05/13/2019 8:20 AM Performed by: Glynda Jaeger, CRNA Pre-anesthesia Checklist: Patient identified, Patient being monitored, Timeout performed, Emergency Drugs available and Suction available Patient Re-evaluated:Patient Re-evaluated prior to induction Oxygen Delivery Method: Circle System Utilized Preoxygenation: Pre-oxygenation with 100% oxygen Induction Type: IV induction Ventilation: Mask ventilation without difficulty Laryngoscope Size: Mac and 4 Grade View: Grade II Tube type: Oral Endobronchial tube: Double lumen EBT and 37 Fr Number of attempts: 1 Airway Equipment and Method: Stylet Placement Confirmation: ETT inserted through vocal cords under direct vision,  positive ETCO2 and breath sounds checked- equal and bilateral Secured at: 27 cm Tube secured with: Tape Dental Injury: Teeth and Oropharynx as per pre-operative assessment

## 2019-05-13 NOTE — Transfer of Care (Signed)
Immediate Anesthesia Transfer of Care Note  Patient: Karen Tate  Procedure(s) Performed: XI ROBOTIC ASSISTED THORASCOPY WITH RIGHT UPPER LOBECTOMY (Right Chest) Lymph Node Dissection (Right Chest) Intercostal Nerve Block (Right Chest)  Patient Location: PACU  Anesthesia Type:General  Level of Consciousness: awake, patient cooperative and responds to stimulation  Airway & Oxygen Therapy: Patient Spontanous Breathing and Patient connected to face mask oxygen  Post-op Assessment: Report given to RN and Post -op Vital signs reviewed and stable  Post vital signs: Reviewed and stable  Last Vitals:  Vitals Value Taken Time  BP 96/46 05/13/19 1226  Temp    Pulse 40 05/13/19 1230  Resp 17 05/13/19 1230  SpO2 100 % 05/13/19 1230  Vitals shown include unvalidated device data.  Last Pain:  Vitals:   05/13/19 0633  TempSrc:   PainSc: 0-No pain         Complications: No apparent anesthesia complications

## 2019-05-13 NOTE — Progress Notes (Signed)
TCTS Evening Rounds  Day of Surgery s/p RUL resection, robotic Extubated, comfortable Bradycardiac but stable BP Minimal CT output, no detectable AL  A/p: Continue early postoperative care. Karen Tate Z. Orvan Seen, [place]

## 2019-05-13 NOTE — Brief Op Note (Addendum)
05/13/2019  11:54 AM  PATIENT:  Karen Tate  74 y.o. 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)  PROCEDURE:  Procedure(s):  XI ROBOTIC ASSISTED THORASCOPY WITH RIGHT UPPER LOBECTOMY (Right) Lymph Node Dissection (Right) Intercostal Nerve Block (Right)  SURGEON:  Surgeon(s) and Role:    * [name], MD - Primary    * Melodie Bouillon MD- Secondary   PHYSICIAN ASSISTANT: Ellwood Handler PA-C  ANESTHESIA:   general  EBL:  50 mL   BLOOD ADMINISTERED:none  DRAINS: 28 Straight Chest Tube   LOCAL MEDICATIONS USED:  BUPIVICAINE   SPECIMEN:  Source of Specimen:  Lymph Nodes, Right Upper Lobe  DISPOSITION OF SPECIMEN:  PATHOLOGY  COUNTS:  YES  TOURNIQUET:  * No tourniquets in log *  DICTATION: .Dragon Dictation  PLAN OF CARE: Admit to inpatient   PATIENT DISPOSITION:  PACU - hemodynamically stable.   Delay start of Pharmacological VTE agent (>24hrs) due to surgical blood loss or risk of bleeding: no

## 2019-05-13 NOTE — Discharge Summary (Addendum)
Physician Discharge Summary  Patient ID: Karen Tate MRN: [id number] DOB/AGE: Oct 19, 1945 74 y.o.  Admit date: 05/13/2019 Discharge date: 05/16/2019  Admission Diagnoses: Right upper lobe lung nodule  Patient Active Problem List   Diagnosis Date Noted  . Coronary artery disease involving native heart without angina pectoris   . Postop check   . S/P CABG x 3 09/18/2017  . Pulmonary embolus ([facility]) 09/15/2017  . Type 2 diabetes mellitus ([place]) 09/15/2017  . Non-STEMI (non-ST elevated myocardial infarction) ([place])   . Unstable angina ([place]) 09/12/2017  . AP (angina pectoris) ([place])   . Abnormal stress test   . Chest pain   . Spondylolisthesis of lumbar region 09/28/2014   Discharge Diagnoses: Adenocarcinoma right upper lobe- Stage IA(T1N0)  Patient Active Problem List   Diagnosis Date Noted  . S/P lobectomy of lung 05/13/2019  . Coronary artery disease involving native heart without angina pectoris   . Postop check   . S/P CABG x 3 09/18/2017  . Pulmonary embolus ([place]) 09/15/2017  . Type 2 diabetes mellitus ([place]) 09/15/2017  . Non-STEMI (non-ST elevated myocardial infarction) ([place])   . Unstable angina ([place]) 09/12/2017  . AP (angina pectoris) ([place])   . Abnormal stress test   . Chest pain   . Spondylolisthesis of lumbar region 09/28/2014   Discharged Condition: good  History of Present Illness:  [name] is a 74 yo AA female known to [facility].  She has a history of CAD S/P CABG x 3 performed in June 2019, Post operative A. Fibrillation, ischemic cardiomyopathy, DVT, PE, HTN, Hyperlipidemia, Type 2 DM.  She is a former smoker having quit in 1980.  The patient had been found to have a pulmonary nodule in the past.  She had a follow up CT scan recently which showed a spiculated nodule in the right upper lobe that was felt to be enlarged.  The patient underwent PET CT which showed nodule to be hypermetabolic with a SUV of 2.4.  She was evaluated by Dr. Roxan Hockey for surgical resection.  The  patient denied chest pain, pressure, tightness, wheezing, cough, and weight loss.  It was felt for definitive diagnosis she she undergo Robotic Right Upper Lobe wedge resection with possible lobectomy.  The risks and benefits of the procedure were explained to the patient and she was agreeable to proceed.  Hospital Course:   [name] presented to [facility] on 05[facility]13/2019.  She was taken to the operating room and underwent Robotic Assisted Right Upper Lobectomy with Lymph Node Dissection, and Intercostal nerve block.  She tolerated the procedure without difficulty, was extubated, and taken to the PACU in stable condition. She continued to progress and we discontinued her chest tube on POD 2. Her follow-up chest xray was stable. We discontinued her central line. She was still requiring some oxygen with ambulation therefore we provided this at discharge. Otherwise today she is deemed ready for discharge home.   Significant Diagnostic Studies: nuclear medicine:   1. Mildly hypermetabolic 1.1 cm right upper lobe pulmonary nodule, maximum SUV 2.4. Given the increase in size, this is likely a small or low-grade malignancy. No findings of adenopathy or metastatic disease. 2. Other imaging findings of potential clinical significance: Aortic Atherosclerosis (ICD10-I70.0). Coronary atherosclerosis. Severe degenerative hip arthropathy.  Treatments: surgery:   XI ROBOTIC ASSISTED THORASCOPY WITH RIGHT UPPER LOBECTOMY (Right) Lymph Node Dissection (Right) Intercostal Nerve Block (Right)  Discharge Exam: Blood pressure 127/63, pulse 99, temperature 98.4 F (36.9 C), temperature source Oral, resp.  rate (!) 21, height 5\' 4"  (1.626 m), weight 89 kg, SpO2 92 %.    General appearance: alert, cooperative and no distress Heart: regular rate and rhythm, S1, S2 normal, no murmur, click, rub or gallop Lungs: clear to auscultation bilaterally Abdomen: soft, non-tender; bowel sounds normal; no masses,   no organomegaly Extremities: extremities normal, atraumatic, no cyanosis or edema Wound: clean and dry    Discharge Medications:   Allergies as of 05/16/2019      Reactions   Beta Adrenergic Blockers Other (See Comments)   Dropped heart rate to the 40's   Calcium Channel Blockers Other (See Comments)   Dropped HR into the 40's   Naproxen Hives      Medication List    STOP taking these medications   lisinopril 40 MG tablet Commonly known as: ZESTRIL   traMADol 50 MG tablet Commonly known as: ULTRAM     TAKE these medications   acetaminophen 500 MG tablet Commonly known as: TYLENOL Take 1,000 mg by mouth every 6 (six) hours as needed for moderate pain.   ALPRAZolam 0.5 MG tablet Commonly known as: XANAX Take 0.5 mg by mouth 2 (two) times daily.   amLODipine 10 MG tablet Commonly known as: NORVASC Take 10 mg by mouth daily.   aspirin EC 81 MG tablet Take 1 tablet (81 mg total) by mouth daily.   atorvastatin 80 MG tablet Commonly known as: LIPITOR Take 1 tablet (80 mg total) by mouth daily at 6 PM.   carvedilol 12.5 MG tablet Commonly known as: COREG Take 1 tablet (12.5 mg total) by mouth 2 (two) times daily with a meal. What changed:   medication strength  how much to take   Centrum Silver 50+Women Tabs Take 1 tablet by mouth daily.   chlorthalidone 25 MG tablet Commonly known as: HYGROTON Take 25 mg by mouth 2 (two) times daily.   fish oil-omega-3 fatty acids 1000 MG capsule Take 1 g by mouth 3 (three) times daily.   glipiZIDE 5 MG tablet Commonly known as: GLUCOTROL Take 5 mg by mouth 2 (two) times daily before a meal.   latanoprost 0.005 % ophthalmic solution Commonly known as: XALATAN Place 1 drop into both eyes at bedtime.   magnesium oxide 400 MG tablet Commonly known as: MAG-OX Take 1 tablet (400 mg total) by mouth 2 (two) times daily. What changed: when to take this   meclizine 25 MG tablet Commonly known as: ANTIVERT Take 25 mg by  mouth 3 (three) times daily as needed for dizziness.   metFORMIN 1000 MG tablet Commonly known as: GLUCOPHAGE Take 1,000 mg by mouth 2 (two) times daily with a meal.   omeprazole 40 MG capsule Commonly known as: PRILOSEC Take 40 mg by mouth daily.   oxyCODONE 5 MG immediate release tablet Commonly known as: Oxy IR/ROXICODONE Take 1 tablet (5 mg total) by mouth every 6 (six) hours as needed for moderate pain.   tiZANidine 4 MG tablet Commonly known as: ZANAFLEX Take 1 tablet (4 mg total) by mouth every 6 (six) hours as needed for muscle spasms.            Durable Medical Equipment  (From admission, onward)         Start     Ordered   05/16/19 0915  For home use only DME oxygen  Once    Question Answer Comment  Length of Need 6 Months   Mode or (Route) Nasal cannula   Liters per Minute  2   Frequency Continuous (stationary and portable oxygen unit needed)   Oxygen delivery system Other see comments      05/16/19 0914         Follow-up Information    [name], MD Follow up.   Specialty: Cardiothoracic Surgery Why: The office will be calling you with a follow-up appointment time and date Contact information: [address] [place] [address] [phone]        Neale Burly, MD. Call in 1 day(s).   Specialty: Internal Medicine Contact information: [place] [phone]        Herminio Commons, MD .   Specialty: Cardiology Contact information: [place] [address] [phone]           Signed: Elgie Collard 05/16/2019, 9:21 AM

## 2019-05-13 NOTE — Anesthesia Procedure Notes (Signed)
Central Venous Catheter Insertion Performed by: Albertha Ghee, MD, anesthesiologist Start/End2/18/2021 7:10 AM, 05/13/2019 7:18 AM Patient location: Pre-op. Preanesthetic checklist: patient identified, IV checked, site marked, risks and benefits discussed, surgical consent, monitors and equipment checked, pre-op evaluation, timeout performed and anesthesia consent Position: Trendelenburg Lidocaine 1% used for infiltration and patient sedated Hand hygiene performed , maximum sterile barriers used  and Seldinger technique used Catheter size: 7 Fr Central line was placed.Double lumen Procedure performed using ultrasound guided technique. Ultrasound Notes:anatomy identified, needle tip was noted to be adjacent to the nerve/plexus identified and no ultrasound evidence of intravascular and/or intraneural injection Attempts: 1 Following insertion, line sutured, dressing applied and Biopatch. Post procedure assessment: blood return through all ports, free fluid flow and no air  Patient tolerated the procedure well with no immediate complications.

## 2019-05-13 NOTE — Consult Note (Addendum)
Cardiology Consultation:   Patient ID: [name] MRN: [id number]; DOB: 1946-01-26  Admit date: 05/13/2019 Date of Consult: 05/13/2019  Primary Care Provider: Neale Burly, MD Primary Cardiologist: Kate Sable, MD  Primary Electrophysiologist:  None    Patient Profile:   Karen Tate is a 74 y.o. female with a hx of CAD s/p CABG 2019 with postoperative afibrillation, ischemic cardiomyopathy, DVT/PE, bilateral carotid artery disease, Iron deficiency anemia, arthritis, reflux, hypertension, hyperlipidemia, moderate MR (ECHO August 2020 ), remote tobacco abuse, type 2 diabetes who is being seen today for the evaluation of bradycardia at the request of Dr. Roxan Hockey.  History of Present Illness:   [name] is followed by Dr. Bronson Ing. Patient has a history of CAD with CABG on 09/18/2017 (left internal mammary artery to LAD, SVG to RI branches 1 and 2).  She had postoperative A. fib converted to normal sinus rhythm with amiodarone. EF at that time was estimated at 25 to 35%. Patient had a prior history of bradycardia on a beta-blocker therefore this was not started.  Patient had previously been on Eliquis for history of DVT/PE (not required for A. fib). Patient was discharged on aspirin   At the follow-up she was started on low-dose Coreg twice daily. She remained in normal sinus rhythm with occasional ectopy. She also had dopplers of the carotids 07/2017 showing 1 to 39% stenosis in the right ICA and 40-59% stenosis in the left ICA.  No recurrence of atrial fibrillation was noted and Eliquis was stopped by her PCP.  Most recent echo was 11/18/2018 showing EF of 60 to 27%, normal diastolic function, mildly reduced RV systolic function, mild aortic calcification, thickening of mitral valve, moderate MR. Patient was last seen 03/16/2019 through televisit and was doing well from a cardiac standpoint.   Patient had a CT done in October 2020 showing a nodule in the right upper lobe.   Subsequently a PET/CT scan was ordered showing the nodule was hypermetabolic with no evidence of distant adenopathy. Suspected non-small cell carcinoma. patient was referred to CT surgery and she was seen on May 06, 2019.  The patient was scheduled for a lobectomy vs back assisted thorascopic wedge resection. The patient came in 05/13/2019 for her scheduled procedure.  She underwent robotic assisted thoracoscopy with right upper lobectomy with lymph node dissection.  Patient tolerated procedure well was taken to the PACU in stable condition.  Anesthesia noted patient had bradycardia into the 30s and cardiology was consulted.   Labs: Potassium 5.2 Creatinine 1.30 WBC 108 Hgb 7.5   Heart Pathway Score:     Past Medical History:  Diagnosis Date  . Anxiety neurosis   . Arthritis   . Asthma   . CAD (coronary artery disease)    a. 08/2017: abnormal nuc-> sent directly to [facility], NSTEMI, s/p CABG (left internal mammary artery to left anterior descending, sequential saphenous vein graft to ramus intermedius branches 1 and 2).  . Carotid artery disease ([place])   . Chronic anemia   . Dizziness   . GERD (gastroesophageal reflux disease)   . Glaucoma   . Hiatal hernia   . Hyperlipemia   . Hypertension   . Ischemic cardiomyopathy   . Joint pain   . Mitral regurgitation   . Postoperative atrial fibrillation ([place]) 08/2017  . Pulmonary embolus ([place])   . Type 2 diabetes mellitus ([place])     Past Surgical History:  Procedure Laterality Date  . BACK SURGERY  2016  . CORONARY ARTERY  BYPASS GRAFT N/A 09/18/2017   Procedure: CORONARY ARTERY BYPASS GRAFTING (CABG);  Surgeon: [name], MD;  Location: [facility];  Service: Open Heart Surgery;  Laterality: N/A;  Saphenous vein harvest. LIMA to LAD Saphenous vein (sequential) ramus 1 & 2  . EYE SURGERY Bilateral    "surgery for glaucoma"  . HERNIA REPAIR    . LEFT HEART CATH AND CORONARY ANGIOGRAPHY N/A 09/15/2017   Procedure: LEFT HEART CATH AND  CORONARY ANGIOGRAPHY;  Surgeon: Lorretta Harp, MD;  Location: [facility];  Service: Cardiovascular;  Laterality: N/A;  . ROTATOR CUFF REPAIR Left    x2  . SHOULDER SURGERY     LEFT  . TEE WITHOUT CARDIOVERSION N/A 09/18/2017   Procedure: TRANSESOPHAGEAL ECHOCARDIOGRAM (TEE);  Surgeon: [name], MD;  Location: [facility];  Service: Open Heart Surgery;  Laterality: N/A;  . TOTAL KNEE ARTHROPLASTY     LEFT  . VAGINAL HYSTERECTOMY     partial     Home Medications:  Prior to Admission medications   Medication Sig Start Date End Date Taking? Authorizing Provider  acetaminophen (TYLENOL) 500 MG tablet Take 1,000 mg by mouth every 6 (six) hours as needed for moderate pain.   Yes [provider]  ALPRAZolam Duanne Moron) 0.5 MG tablet Take 0.5 mg by mouth 2 (two) times daily.   Yes [provider]  amLODipine (NORVASC) 10 MG tablet Take 10 mg by mouth daily.   Yes [provider]  aspirin 81 MG tablet Take 1 tablet (81 mg total) by mouth daily. 10/20/17  Yes Lars Pinks M, PA-C  atorvastatin (LIPITOR) 80 MG tablet Take 1 tablet (80 mg total) by mouth daily at 6 PM. 11/17/17  Yes Herminio Commons, MD  carvedilol (COREG) 25 MG tablet Take 25 mg by mouth 2 (two) times daily with a meal.   Yes [provider]  chlorthalidone (HYGROTON) 25 MG tablet Take 25 mg by mouth 2 (two) times daily.  07/20/18  Yes [provider]  fish oil-omega-3 fatty acids 1000 MG capsule Take 1 g by mouth 3 (three) times daily.    Yes [provider]  glipiZIDE (GLUCOTROL) 5 MG tablet Take 5 mg by mouth 2 (two) times daily before a meal.   Yes [provider]  latanoprost (XALATAN) 0.005 % ophthalmic solution Place 1 drop into both eyes at bedtime. 02/10/19  Yes [provider]  lisinopril (ZESTRIL) 40 MG tablet Take 40 mg by mouth daily.  07/28/18  Yes [provider]  magnesium oxide (MAG-OX) 400 MG tablet Take 1 tablet (400  mg total) by mouth 2 (two) times daily. Patient taking differently: Take 400 mg by mouth daily.  10/10/17  Yes Dunn, Nedra Hai, PA-C  meclizine (ANTIVERT) 25 MG tablet Take 25 mg by mouth 3 (three) times daily as needed for dizziness.   Yes [provider]  metFORMIN (GLUCOPHAGE) 1000 MG tablet Take 1,000 mg by mouth 2 (two) times daily with a meal.   Yes [provider]  Multiple Vitamins-Minerals (CENTRUM SILVER 50+WOMEN) TABS Take 1 tablet by mouth daily.    Yes [provider]  omeprazole (PRILOSEC) 40 MG capsule Take 40 mg by mouth daily.   Yes [provider]  traMADol (ULTRAM) 50 MG tablet Take 50 mg by mouth 2 (two) times daily as needed for pain. 04/15/19  Yes [provider]  tiZANidine (ZANAFLEX) 4 MG tablet Take 1 tablet (4 mg total) by mouth every 6 (  six) hours as needed for muscle spasms. 10/02/14   Ashok Pall, MD    Inpatient Medications: Scheduled Meds: . sodium chloride   Intravenous Once  . bupivacaine liposome  20 mL Infiltration Once  . fentaNYL      . morphine   Intravenous Q4H  . morphine      . ondansetron       Continuous Infusions: . sodium chloride    . phenylephrine (NEO-SYNEPHRINE) Adult infusion 60 mcg/min (05/13/19 1339)   PRN Meds: fentaNYL (SUBLIMAZE) injection, ondansetron (ZOFRAN) IV, oxyCODONE **OR** oxyCODONE  Allergies:    Allergies  Allergen Reactions  . Beta Adrenergic Blockers Other (See Comments)    Dropped heart rate to the 40's  . Calcium Channel Blockers Other (See Comments)    Dropped HR into the 40's  . Naproxen Hives    Social History:   Social History   Socioeconomic History  . Marital status: Married    Spouse name: Not on file  . Number of children: Not on file  . Years of education: Not on file  . Highest education level: Not on file  Occupational History  . Not on file  Tobacco Use  . Smoking status: Former Smoker    Packs/day: 1.25    Years: 16.00    Pack years: 20.00     Types: Cigarettes    Quit date: 03/25/1978    Years since quitting: 41.1  . Smokeless tobacco: Never Used  . Tobacco comment: quit more than 30 years ago  Substance and Sexual Activity  . Alcohol use: No  . Drug use: No  . Sexual activity: Not on file  Other Topics Concern  . Not on file  Social History Narrative  . Not on file   Social Determinants of Health   Financial Resource Strain:   . Difficulty of Paying Living Expenses: Not on file  Food Insecurity:   . Worried About Charity fundraiser in the Last Year: Not on file  . Ran Out of Food in the Last Year: Not on file  Transportation Needs:   . Lack of Transportation (Medical): Not on file  . Lack of Transportation (Non-Medical): Not on file  Physical Activity:   . Days of Exercise per Week: Not on file  . Minutes of Exercise per Session: Not on file  Stress:   . Feeling of Stress : Not on file  Social Connections:   . Frequency of Communication with Friends and Family: Not on file  . Frequency of Social Gatherings with Friends and Family: Not on file  . Attends Religious Services: Not on file  . Active Member of Clubs or Organizations: Not on file  . Attends Archivist Meetings: Not on file  . Marital Status: Not on file  Intimate Partner Violence:   . Fear of Current or Ex-Partner: Not on file  . Emotionally Abused: Not on file  . Physically Abused: Not on file  . Sexually Abused: Not on file    Family History:   Family History  Problem Relation Age of Onset  . Diabetes Father   . Breast cancer Mother      ROS:  Please see the history of present illness.  All other ROS reviewed and negative.     Physical Exam/Data:   Vitals:   05/13/19 1315 05/13/19 1330 05/13/19 1339 05/13/19 1345  BP: (!) 92/40 (!) 100/45  (!) 91/39  Pulse: (!) 38 (!) 38  (!) 38  Resp: (!) 30 (!)  27  18  Temp: (!) 97 F (36.1 C)     TempSrc:      SpO2: 95% 95% 92% 100%  Weight:      Height:        Intake/Output  Summary (Last 24 hours) at 05/13/2019 1348 Last data filed at 05/13/2019 1246 Gross per 24 hour  Intake 1400 ml  Output 285 ml  Net 1115 ml   Last 3 Weights 05/13/2019 05/13/2019 05/12/2019  Weight (lbs) 190 lb 188 lb 11.1 oz 190 lb 14.4 oz  Weight (kg) 86.183 kg 85.592 kg 86.592 kg     Body mass index is 32.61 kg/m.  General:  Well nourished, well developed, in no acute distress HEENT: normal Lymph: no adenopathy Neck: unable to assess JVD Endocrine:  No thryomegaly Vascular: No carotid bruits; FA pulses 2+ bilaterally without bruits  Cardiac: difficult to hear with overwhelming lung sounds; normal S1, S2; bradycardic; no murmur  Lungs:  clear to auscultation bilaterally, no wheezing, rhonchi or rales  Abd: soft, nontender, no hepatomegaly  Ext: minimal edema Musculoskeletal:  No deformities, BUE and BLE strength normal and equal Skin: warm and dry  Neuro:  CNs 2-12 intact, no focal abnormalities noted Psych:  Normal affect   EKG:  The EKG was personally reviewed and demonstrates: As bradycardia 39 bpm normal PRI, nonspecific T wave changes Telemetry:  Telemetry was personally reviewed and demonstrates:  N/A  Relevant CV Studies:  Echo 11/18/19 1. The left ventricle has normal systolic function with an ejection  fraction of 60-65%. The cavity size was normal. Left ventricular diastolic  parameters were normal.  2. The right ventricle has mildly reduced systolic function. The cavity  was normal. There is no increase in right ventricular wall thickness.  Right ventricular systolic pressure is moderately elevated with an  estimated pressure of 49.5 mmHg.  3. The aortic valve is tricuspid. Mild calcification of the aortic valve.  Mild to moderate aortic annular calcification noted.  4. The mitral valve is degenerative. Mild thickening of the mitral valve  leaflet. Mild calcification of the mitral valve leaflet. Mitral valve  regurgitation is moderate by color flow Doppler. The  MR jet is eccentric  posteriorly directed.  5. The tricuspid valve is grossly normal.  6. The aorta is normal unless otherwise noted.   Cardiac cath 09/15/2017 IMPRESSION: Ms. Bostwick has severe disease in her proximal LAD, mid ramus branch which is large and calcified and AV groove circumflex which is nondominant.  I do not think that her ramus branch which subtends a large area of myocardium is percutaneously addressable because of its calcified nature and tortuosity.  The best option would be coronary artery bypass grafting.  Her LVEDP was 31.  Her LVEF was in the 30% range.  The sheath was removed and a TR band was placed on the right wrist to achieve patent hemostasis.  The patient left the lab in stable condition.  TCT S will be notified. Laboratory Data:  High Sensitivity Troponin:  No results for input(s): TROPONINIHS in the last 720 hours.   Chemistry Recent Labs  Lab 05/12/19 1215  NA 140  K 4.4  CL 105  CO2 22  GLUCOSE 147*  BUN 21  CREATININE 1.26*  CALCIUM 10.0  GFRNONAA 42*  GFRAA 48*  ANIONGAP 13    Recent Labs  Lab 05/12/19 1215  PROT 7.2  ALBUMIN 3.8  AST 18  ALT 17  ALKPHOS 40  BILITOT 0.7   Hematology Recent Labs  Lab 05/12/19 1215 05/13/19 1254  WBC 6.2 10.8*  RBC 3.43* 2.75*  HGB 9.3* 7.5*  HCT 31.8* 24.9*  MCV 92.7 90.5  MCH 27.1 27.3  MCHC 29.2* 30.1  RDW 18.7* 18.6*  PLT 396 249   BNPNo results for input(s): BNP, PROBNP in the last 168 hours.  DDimer No results for input(s): DDIMER in the last 168 hours.   Radiology/Studies:  DG Chest 2 View  Result Date: 05/12/2019 CLINICAL DATA:  Right upper lobe pulmonary nodule. EXAM: CHEST - 2 VIEW COMPARISON:  10/20/2017 chest radiograph. FINDINGS: Intact sternotomy wires. Stable cardiomediastinal silhouette with normal heart size. No pneumothorax. No pleural effusion. Known right upper lobe 1 cm nodule, faintly visualized. No pulmonary edema. No acute consolidative airspace disease. IMPRESSION:  Faintly visualized known right upper lobe 1 cm nodule. No superimposed acute cardiopulmonary disease. Electronically Signed   By: Ilona Sorrel M.D.   On: 05/12/2019 21:03   DG Chest Port 1 View  Result Date: 05/13/2019 CLINICAL DATA:  Prior lung surgery. EXAM: PORTABLE CHEST 1 VIEW COMPARISON:  05/12/2019. FINDINGS: Right IJ line noted with tip over cavoatrial junction. Right chest tube noted with tip over right upper chest. Mild bibasilar subsegmental atelectasis and or scarring. Prior CABG. Heart size stable. Degenerative change thoracic spine. IMPRESSION: 1.  Right IJ line right chest tube in stable position. 2.  Mild bibasilar subsegmental atelectasis and or scarring. 3.  Prior CABG.  Heart size stable. Electronically Signed   By: Marcello Moores  Register   On: 05/13/2019 12:41     Assessment and Plan:   Bradycardia -Patient has known history of bradycardia on a beta-blocker -At baseline she takes Coreg and a 25 twice daily -EKG prior to the procedure showed normal sinus rhythm at 70 bpm and first-degree AV block with nonspecific T wave changes -EKG postprocedure shows sinus bradycardia 39 bpm>> beta-blocker held -Patient is feeling lightheaded and nauseous which could also be from aesthesia -Continue to hold beta-blocker and plan for 48-hour washout -Continue to monitor on telemetry -Patient is still bradycardic after 48 hours would consider EP consult  Acute on chronic Anemia - Hgb 7.5 post procedure - Baseline 9  HTN -On amlodipine 10 mg, chlorthalidone 25 mg BID, and lisinopril 40 mg at baseline -Discontinue beta-blocker as above - currently hypotensive   Hyperlipidemia -Continue statin  CAD s/p CABG 2019 -Continue aspirin and statin  Postop atrial fibrillation -With no recurrence patient was not felt to need long-term anticoagulation   For questions or updates, please contact [facility] Please consult [email or web address] for contact info under     Signed, Cadence Ninfa Meeker,  PA-C  05/13/2019 1:48 PM   I have examined the patient and reviewed assessment and plan and discussed with patient.  Agree with above as stated.  Bradycardia noted post procedure.  Narrow QRS, with sinus bradycardia.  Hold beta blocker.  Continue to monitor.  Will reassess in AM.  May need to restart Carvedilol back at a lower dose.   Larae Grooms

## 2019-05-13 NOTE — Discharge Instructions (Signed)
Discharge Instructions:  1. You may shower, please wash incisions daily with soap and water and keep dry.  If you wish to cover wounds with dressing you may do so but please keep clean and change daily.  No tub baths or swimming until incisions have completely healed.  If your incisions become red or develop any drainage please call our office at [phone]  2. No Driving until cleared by Dr. Hendrickson's office and you are no longer using narcotic pain medications  3. Fever of 101.5 for at least 24 hours with no source, please contact our office at [phone]  4. Activity- up as tolerated, please walk at least 3 times per day.  Avoid strenuous activity, no lifting, pushing, or pulling with your arms over 8-10 lbs for a minimum of 6 weeks  5. If any questions or concerns arise, please do not hesitate to contact our office at [phone]  

## 2019-05-13 NOTE — Anesthesia Procedure Notes (Signed)
Arterial Line Insertion Start/End2/18/2021 7:35 AM, 05/13/2019 7:37 AM Performed by: Glynda Jaeger, CRNA, CRNA  Preanesthetic checklist: patient identified, IV checked, site marked, risks and benefits discussed, surgical consent, monitors and equipment checked, pre-op evaluation, timeout performed and anesthesia consent Lidocaine 1% used for infiltration Left, radial was placed Catheter size: 20 G Hand hygiene performed  and maximum sterile barriers used  Allen's test indicative of satisfactory collateral circulation Attempts: 1 Procedure performed without using ultrasound guided technique. Following insertion, dressing applied and Biopatch. Post procedure assessment: normal  Patient tolerated the procedure well with no immediate complications.

## 2019-05-13 NOTE — Interval H&P Note (Signed)
History and Physical Interval Note: Repeat PFTs show FVC FEV1 and DLCO all around 60% of predicted. Will proceed. 05/13/2019 8:00 AM  Karen Tate  has presented today for surgery, with the diagnosis of RUL NODULE.  The various methods of treatment have been discussed with the patient and family. After consideration of risks, benefits and other options for treatment, the patient has consented to  Procedure(s): XI ROBOTIC ASSISTED THORASCOPY-POSSIBLE WEDGE RESECTION OR RIGHT UPPER LOBECTOMY (Right) as a surgical intervention.  The patient's history has been reviewed, patient examined, no change in status, stable for surgery.  I have reviewed the patient's chart and labs.  Questions were answered to the patient's satisfaction.     [name]

## 2019-05-14 ENCOUNTER — Inpatient Hospital Stay (HOSPITAL_COMMUNITY): Payer: Medicare Other

## 2019-05-14 LAB — POCT I-STAT 7, (LYTES, BLD GAS, ICA,H+H)
Bicarbonate: 25.4 mmol/L (ref 20.0–28.0)
Calcium, Ion: 1.17 mmol/L (ref 1.15–1.40)
HCT: 23 % — ABNORMAL LOW (ref 36.0–46.0)
Hemoglobin: 7.8 g/dL — ABNORMAL LOW (ref 12.0–15.0)
O2 Saturation: 97 %
Patient temperature: 98.2
Potassium: 4.7 mmol/L (ref 3.5–5.1)
Sodium: 139 mmol/L (ref 135–145)
TCO2: 27 mmol/L (ref 22–32)
pCO2 arterial: 43.2 mmHg (ref 32.0–48.0)
pH, Arterial: 7.376 (ref 7.350–7.450)
pO2, Arterial: 95 mmHg (ref 83.0–108.0)

## 2019-05-14 LAB — GLUCOSE, CAPILLARY
Glucose-Capillary: 151 mg/dL — ABNORMAL HIGH (ref 70–99)
Glucose-Capillary: 189 mg/dL — ABNORMAL HIGH (ref 70–99)
Glucose-Capillary: 199 mg/dL — ABNORMAL HIGH (ref 70–99)
Glucose-Capillary: 187 mg/dL — ABNORMAL HIGH (ref 70–99)

## 2019-05-14 LAB — BASIC METABOLIC PANEL
Anion gap: 10 (ref 5–15)
BUN: 20 mg/dL (ref 8–23)
CO2: 24 mmol/L (ref 22–32)
Calcium: 8.4 mg/dL — ABNORMAL LOW (ref 8.9–10.3)
Chloride: 104 mmol/L (ref 98–111)
Creatinine, Ser: 1.29 mg/dL — ABNORMAL HIGH (ref 0.44–1.00)
GFR calc Af Amer: 47 mL/min — ABNORMAL LOW (ref >60)
GFR calc non Af Amer: 40 mL/min — ABNORMAL LOW (ref >60)
Glucose, Bld: 142 mg/dL — ABNORMAL HIGH (ref 70–99)
Potassium: 4.4 mmol/L (ref 3.5–5.1)
Sodium: 138 mmol/L (ref 135–145)

## 2019-05-14 LAB — CBC
HCT: 24.1 % — ABNORMAL LOW (ref 36.0–46.0)
Hemoglobin: 7.3 g/dL — ABNORMAL LOW (ref 12.0–15.0)
MCH: 27.1 pg (ref 26.0–34.0)
MCHC: 30.3 g/dL (ref 30.0–36.0)
MCV: 89.6 fL (ref 80.0–100.0)
Platelets: 286 10*3/uL (ref 150–400)
RBC: 2.69 MIL/uL — ABNORMAL LOW (ref 3.87–5.11)
RDW: 18.5 % — ABNORMAL HIGH (ref 11.5–15.5)
WBC: 10.6 10*3/uL — ABNORMAL HIGH (ref 4.0–10.5)
nRBC: 0 % (ref 0.0–0.2)

## 2019-05-14 MED ORDER — SODIUM CHLORIDE 0.9% FLUSH
10.0000 mL | Freq: Two times a day (BID) | INTRAVENOUS | Status: DC
  Administered 2019-05-14 (×2): 10 mL
  Administered 2019-05-14: 20 mL
  Administered 2019-05-15 – 2019-05-16 (×3): 10 mL

## 2019-05-14 MED ORDER — METFORMIN HCL 500 MG PO TABS
500.0000 mg | ORAL_TABLET | Freq: Two times a day (BID) | ORAL | Status: DC
  Administered 2019-05-14 – 2019-05-16 (×4): 500 mg via ORAL
  Filled 2019-05-14 (×4): qty 1

## 2019-05-14 MED ORDER — GLIPIZIDE 5 MG PO TABS
5.0000 mg | ORAL_TABLET | Freq: Two times a day (BID) | ORAL | Status: DC
  Administered 2019-05-14 – 2019-05-16 (×4): 5 mg via ORAL
  Filled 2019-05-14 (×5): qty 1

## 2019-05-14 MED ORDER — SODIUM CHLORIDE 0.9% FLUSH
10.0000 mL | INTRAVENOUS | Status: DC | PRN
  Administered 2019-05-14: 10 mL

## 2019-05-14 MED ORDER — JUVEN PO PACK
1.0000 | PACK | Freq: Two times a day (BID) | ORAL | Status: DC
  Administered 2019-05-14 – 2019-05-16 (×4): 1 via ORAL
  Filled 2019-05-14 (×4): qty 1

## 2019-05-14 MED ORDER — ENSURE ENLIVE PO LIQD
237.0000 mL | Freq: Two times a day (BID) | ORAL | Status: DC
  Administered 2019-05-14 – 2019-05-16 (×4): 237 mL via ORAL

## 2019-05-14 NOTE — Anesthesia Postprocedure Evaluation (Signed)
Anesthesia Post Note  Patient: Karen Tate  Procedure(s) Performed: XI ROBOTIC ASSISTED THORASCOPY WITH RIGHT UPPER LOBECTOMY (Right Chest) Lymph Node Dissection (Right Chest) Intercostal Nerve Block (Right Chest)     Patient location during evaluation: PACU Anesthesia Type: General Level of consciousness: awake and alert Pain management: pain level controlled Vital Signs Assessment: post-procedure vital signs reviewed and stable Respiratory status: spontaneous breathing, nonlabored ventilation, respiratory function stable and patient connected to nasal cannula oxygen Cardiovascular status: blood pressure returned to baseline and stable Postop Assessment: no apparent nausea or vomiting Anesthetic complications: no Comments: Pt was bradycardic throughout surgery and in the PACU (HR 35-40).  Cardiology consulted.     Last Vitals:  Vitals:   05/14/19 0838 05/14/19 0900  BP:  (!) 139/57  Pulse:  76  Resp:  (!) 26  Temp: 36.6 C   SpO2:  99%    Last Pain:  Vitals:   05/14/19 0838  TempSrc: Oral  PainSc:                  [name]

## 2019-05-14 NOTE — Progress Notes (Signed)
PCA ordered. No PCA present at bedside when given report. Pt denies any pain. Vital signs are stable. Pt is comfortable.

## 2019-05-14 NOTE — Progress Notes (Signed)
1 Day Post-Op Procedure(s) (LRB): XI ROBOTIC ASSISTED THORASCOPY WITH RIGHT UPPER LOBECTOMY (Right) Lymph Node Dissection (Right) Intercostal Nerve Block (Right) Subjective: No complaints presently, denies pain and nausea  Objective: Vital signs in last 24 hours: Temp:  [97 F (36.1 C)-98.6 F (37 C)] 97.8 F (36.6 C) (02/19 0838) Pulse Rate:  [32-77] 77 (02/19 0700) Cardiac Rhythm: Normal sinus rhythm (02/19 0430) Resp:  [15-30] 29 (02/19 0700) BP: (88-141)/(39-107) 141/56 (02/19 0700) SpO2:  [92 %-100 %] 100 % (02/19 0700) Arterial Line BP: (62-165)/(33-54) 138/50 (02/19 0700) Weight:  [89 kg] 89 kg (02/19 0600)  Hemodynamic parameters for last 24 hours:    Intake/Output from previous day: 02/18 0701 - 02/19 0700 In: 2803.8 [P.O.:480; I.V.:1923.8; IV Piggyback:400] Out: 1930 [Urine:1230; Blood:50; Chest Tube:650] Intake/Output this shift: No intake/output data recorded.  General appearance: alert, cooperative and no distress Neurologic: intact Heart: regular rate and rhythm Lungs: diminished breath sounds bibasilar Abdomen: normal findings: soft, non-tender no air leak  Lab Results: Recent Labs    05/13/19 1254 05/13/19 1254 05/14/19 0431 05/14/19 0433  WBC 10.8*  --  10.6*  --   HGB 7.5*   < > 7.3* 7.8*  HCT 24.9*   < > 24.1* 23.0*  PLT 249  --  286  --    < > = values in this interval not displayed.   BMET:  Recent Labs    05/12/19 1215 05/13/19 1044 05/13/19 1058 05/13/19 1058 05/14/19 0431 05/14/19 0433  NA 140   < > 139   < > 138 139  K 4.4   < > 5.2*   < > 4.4 4.7  CL 105  --  106  --  104  --   CO2 22  --   --   --  24  --   GLUCOSE 147*  --  212*  --  142*  --   BUN 21  --  20  --  20  --   CREATININE 1.26*  --  1.30*  --  1.29*  --   CALCIUM 10.0  --   --   --  8.4*  --    < > = values in this interval not displayed.    PT/INR:  Recent Labs    05/12/19 1215  LABPROT 12.9  INR 1.0   ABG    Component Value Date/Time   PHART  7.376 05/14/2019 0433   HCO3 25.4 05/14/2019 0433   TCO2 27 05/14/2019 0433   ACIDBASEDEF 5.0 (H) 05/13/2019 1044   O2SAT 97.0 05/14/2019 0433   CBG (last 3)  Recent Labs    05/13/19 1721 05/13/19 2228 05/14/19 0655  GLUCAP 202* 152* 151*    Assessment/Plan: S/P Procedure(s) (LRB): XI ROBOTIC ASSISTED THORASCOPY WITH RIGHT UPPER LOBECTOMY (Right) Lymph Node Dissection (Right) Intercostal Nerve Block (Right) transfer to progressive unit  CV- she is in SR in the 80s with a good BP- will defer to Cardiology on Coreg  RESP- on 2L [place]- IS  No air leak- CT to water seal  RENAL- creatinine stable, mildly elevated, stage III CKD  ENDO- CBG mildly elevated, restart PO meds  SCD + Enoxaparin for DVT prophylaxis  Ambulate  Tolerating regular diet   LOS: 1 day    Karen Tate 05/14/2019

## 2019-05-14 NOTE — Progress Notes (Signed)
Report received . She is lying in bed w/ eyes opened. A/O *4. Denies pain at this time. She is on RA. Rt IJ double lumen C/D/I. Left radial Arterial Line intact. Right chest tube -20 suction, w/ sanguinous output intact w/ clean dressing. 3 VATs areas on right chest anterior-2, posterior-1 C/D/I . Foley catheter is intact w/ clear yellow urine output. Denies needs at this time. Able to tolerate full liquid diet at this time w/out nausea. Call light in reach. Continuing to monitor.

## 2019-05-14 NOTE — Progress Notes (Signed)
Initial Nutrition Assessment  DOCUMENTATION CODES:   Obesity unspecified  INTERVENTION:  -Ensure Enlive po BID, each supplement provides 350 kcal and 20 grams of protein  -1 packet Juven BID, each packet provides 95 calories, 2.5 grams of protein (collagen), and 9.8 grams of carbohydrate (3 grams sugar); also contains 7 grams of L-arginine and L-glutamine, 300 mg vitamin C, 15 mg vitamin E, 1.2 mcg vitamin B-12, 9.5 mg zinc, 200 mg calcium, and 1.5 g  Calcium Beta-hydroxy-Beta-methylbutyrate to support wound healing    NUTRITION DIAGNOSIS:   Increased nutrient needs related to post-op healing(s/p robotic assisted thorascopy with right upper lobectomy) as evidenced by estimated needs.    GOAL:   Patient will meet greater than or equal to 90% of their needs    MONITOR:   Labs, I & O's, PO intake, Weight trends, Supplement acceptance, Skin  REASON FOR ASSESSMENT:   Malnutrition Screening Tool    ASSESSMENT:  RD working remotely.  74 year old female with past medical history of CAD s/p CABG x 3 (2019) postoperative atrial fibrillation, ischemic cardiomyopathy, DVT, pulmonary embolus, arthritis, reflux, HTN, HLD, DM2, glaucoma presented for cardiothoracic evaluation of right upper lobe lung nodule.  Patient is s/p robotic assisted thorascopy with right upper lobectomy on 2/18  2/18 extubated  Per notes, pt without complaints, denies pain and nausea. Tolerating HH diet, no documented meals at this time for review. Will provide Ensure supplement to aid with estimated kcal/protien needs and Juven to support post operative wound healing.  Current wt 195.8 lbs    Admit wt  189.64 lbs Weight history reviewed, stable over the past year.  I/Os: +1887 ml since admit Chest tube: 650 ml x 24 hrs UOP: 1230 ml x 24 hrs  Medications reviewed and include: Dulcolax, Mag-ox, Reglan, Glipizide, SS novolog, Protonix, Senokot NaCl Labs: CBGs 189, 151, 152, 202 x 24 hrs, Cr 1.29 (H), WBC  10.6 (H), Hgb 7.8 (L) trending up  NUTRITION - FOCUSED PHYSICAL EXAM: Unable to complete at this time, RD working remotely.  Diet Order:   Diet Order            Diet Heart Room service appropriate? Yes; Fluid consistency: Thin  Diet effective now              EDUCATION NEEDS:   No education needs have been identified at this time  Skin:  Skin Assessment: Skin Integrity Issues: Skin Integrity Issues:: Incisions Incisions: Closed; R chest  Last BM:  2/17  Height:   Ht Readings from Last 1 Encounters:  05/13/19 5\' 4"  (1.626 m)    Weight:   Wt Readings from Last 1 Encounters:  05/14/19 89 kg    Ideal Body Weight:  54.5 kg  BMI:  Body mass index is 33.68 kg/m.  Estimated Nutritional Needs:   Kcal:  1900-2100  Protein:  95-107  Fluid:  >/= 1.9 L/day   Lajuan Lines, RD, LDN Clinical Nutrition [place] Telephone [phone] After Hours/Weekend Pager # in [facility]

## 2019-05-15 ENCOUNTER — Inpatient Hospital Stay (HOSPITAL_COMMUNITY): Payer: Medicare Other

## 2019-05-15 DIAGNOSIS — I25119 Atherosclerotic heart disease of native coronary artery with unspecified angina pectoris: Secondary | ICD-10-CM

## 2019-05-15 LAB — COMPREHENSIVE METABOLIC PANEL
ALT: 35 U/L (ref 0–44)
AST: 42 U/L — ABNORMAL HIGH (ref 15–41)
Albumin: 2.7 g/dL — ABNORMAL LOW (ref 3.5–5.0)
Alkaline Phosphatase: 37 U/L — ABNORMAL LOW (ref 38–126)
Anion gap: 11 (ref 5–15)
BUN: 25 mg/dL — ABNORMAL HIGH (ref 8–23)
CO2: 24 mmol/L (ref 22–32)
Calcium: 8.8 mg/dL — ABNORMAL LOW (ref 8.9–10.3)
Chloride: 101 mmol/L (ref 98–111)
Creatinine, Ser: 1.23 mg/dL — ABNORMAL HIGH (ref 0.44–1.00)
GFR calc Af Amer: 50 mL/min — ABNORMAL LOW (ref >60)
GFR calc non Af Amer: 43 mL/min — ABNORMAL LOW (ref >60)
Glucose, Bld: 276 mg/dL — ABNORMAL HIGH (ref 70–99)
Potassium: 4.1 mmol/L (ref 3.5–5.1)
Sodium: 136 mmol/L (ref 135–145)
Total Bilirubin: 0.9 mg/dL (ref 0.3–1.2)
Total Protein: 5.7 g/dL — ABNORMAL LOW (ref 6.5–8.1)

## 2019-05-15 LAB — CBC
HCT: 24.2 % — ABNORMAL LOW (ref 36.0–46.0)
Hemoglobin: 7.6 g/dL — ABNORMAL LOW (ref 12.0–15.0)
MCH: 27.6 pg (ref 26.0–34.0)
MCHC: 31.4 g/dL (ref 30.0–36.0)
MCV: 88 fL (ref 80.0–100.0)
Platelets: 263 10*3/uL (ref 150–400)
RBC: 2.75 MIL/uL — ABNORMAL LOW (ref 3.87–5.11)
RDW: 18.2 % — ABNORMAL HIGH (ref 11.5–15.5)
WBC: 10.9 10*3/uL — ABNORMAL HIGH (ref 4.0–10.5)
nRBC: 0 % (ref 0.0–0.2)

## 2019-05-15 LAB — GLUCOSE, CAPILLARY
Glucose-Capillary: 114 mg/dL — ABNORMAL HIGH (ref 70–99)
Glucose-Capillary: 254 mg/dL — ABNORMAL HIGH (ref 70–99)
Glucose-Capillary: 144 mg/dL — ABNORMAL HIGH (ref 70–99)
Glucose-Capillary: 144 mg/dL — ABNORMAL HIGH (ref 70–99)

## 2019-05-15 MED ORDER — CARVEDILOL 6.25 MG PO TABS
6.2500 mg | ORAL_TABLET | Freq: Two times a day (BID) | ORAL | Status: DC
  Administered 2019-05-15 – 2019-05-16 (×2): 6.25 mg via ORAL
  Filled 2019-05-15 (×2): qty 1

## 2019-05-15 NOTE — Progress Notes (Signed)
Pt's chest tube Dc this am and Foley too, Pt  Ambulated in a hallway twice after that, using oxygen on and off. Denies pain, will continue to monitor the patient  Palma Holter, RN

## 2019-05-15 NOTE — Progress Notes (Addendum)
[address]       [place] [address]             [phone]      2 Days Post-Op Procedure(s) (LRB): XI ROBOTIC ASSISTED THORASCOPY WITH RIGHT UPPER LOBECTOMY (Right) Lymph Node Dissection (Right) Intercostal Nerve Block (Right) Subjective: Feels okay. Having some back pain from being in the bed. Pain is overall well controlled on her medication.   Objective: Vital signs in last 24 hours: Temp:  [97.7 F (36.5 C)-99.1 F (37.3 C)] 97.7 F (36.5 C) (02/20 0753) Pulse Rate:  [76-92] 89 (02/20 0753) Cardiac Rhythm: Normal sinus rhythm (02/20 0701) Resp:  [15-33] 20 (02/20 0753) BP: (120-150)/(52-59) 128/59 (02/20 0753) SpO2:  [90 %-100 %] 92 % (02/20 0753)     Intake/Output from previous day: 02/19 0701 - 02/20 0700 In: 242.1 [I.V.:242.1] Out: 3010 [Urine:2750; Chest Tube:260] Intake/Output this shift: Total I/O In: -  Out: 510 [Urine:500; Chest Tube:10]  General appearance: alert, cooperative and no distress Heart: regular rate and rhythm, S1, S2 normal, no murmur, click, rub or gallop Lungs: clear to auscultation bilaterally Abdomen: soft, non-tender; bowel sounds normal; no masses,  no organomegaly Extremities: extremities normal, atraumatic, no cyanosis or edema Wound: clean and dry  Lab Results: Recent Labs    05/13/19 1254 05/13/19 1254 05/14/19 0431 05/14/19 0433  WBC 10.8*  --  10.6*  --   HGB 7.5*   < > 7.3* 7.8*  HCT 24.9*   < > 24.1* 23.0*  PLT 249  --  286  --    < > = values in this interval not displayed.   BMET:  Recent Labs    05/12/19 1215 05/13/19 1044 05/13/19 1058 05/13/19 1058 05/14/19 0431 05/14/19 0433  NA 140   < > 139   < > 138 139  K 4.4   < > 5.2*   < > 4.4 4.7  CL 105  --  106  --  104  --   CO2 22  --   --   --  24  --   GLUCOSE 147*  --  212*  --  142*  --   BUN 21  --  20  --  20  --   CREATININE 1.26*  --  1.30*  --  1.29*  --   CALCIUM 10.0  --   --   --  8.4*  --    < > = values in this  interval not displayed.    PT/INR:  Recent Labs    05/12/19 1215  LABPROT 12.9  INR 1.0   ABG    Component Value Date/Time   PHART 7.376 05/14/2019 0433   HCO3 25.4 05/14/2019 0433   TCO2 27 05/14/2019 0433   ACIDBASEDEF 5.0 (H) 05/13/2019 1044   O2SAT 97.0 05/14/2019 0433   CBG (last 3)  Recent Labs    05/14/19 1533 05/14/19 2137 05/15/19 0607  GLUCAP 199* 187* 114*    Assessment/Plan: S/P Procedure(s) (LRB): XI ROBOTIC ASSISTED THORASCOPY WITH RIGHT UPPER LOBECTOMY (Right) Lymph Node Dissection (Right) Intercostal Nerve Block (Right)  1. CV-NSR rate of 80s, BP well controlled. Continue ASA, statin, coreg per cards.  2. Pulm-CXR reviewed. Small right apical pneumothorax. Right perihilar atelectasis.  3. Renal-creatinine 1.29, stable. Electrolytes okay 4. H and H 7.8/23.0, increased from yesterday. Will trend. Expected acute blood loss anemia.  5. Endo-continue glucotrol and Metformin. Continue SSI  6. PCA pump, Oxycodone/Tramadol for pain  control.  7. Continue lovenox for DVT prophylaxis  Plan: Chest tube output minimal and no air leak but with right small pneumothorax. Will keep in place for now. Continue to encourage oral intake and incentive spirometer use. Ambulate in the halls today. OOB to chair.      LOS: 2 days    Karen Tate 05/15/2019 Patient seen and examined, agree with above No air leak, only 80 ml out last 12 hours, CXR shows a small apical space- will dc chest tube Ambulate Not on a PCA  Karen Tate C. Roxan Hockey, MD [facility] [phone]

## 2019-05-15 NOTE — Progress Notes (Signed)
Progress Note  Patient Name: Karen Tate Date of Encounter: 05/15/2019  Primary Cardiologist: Kate Sable, MD  Subjective   No angina symptoms, no palpitations or breathlessness at rest.  No dizziness.  Inpatient Medications    Scheduled Meds: . acetaminophen  1,000 mg Oral Q6H   Or  . acetaminophen (TYLENOL) oral liquid 160 mg/5 mL  1,000 mg Oral Q6H  . ALPRAZolam  0.5 mg Oral BID  . amLODipine  10 mg Oral Daily  . aspirin EC  81 mg Oral Daily  . atorvastatin  80 mg Oral q1800  . bisacodyl  10 mg Oral Daily  . Chlorhexidine Gluconate Cloth  6 each Topical Q0600  . chlorthalidone  25 mg Oral BID  . enoxaparin (LOVENOX) injection  40 mg Subcutaneous Daily  . feeding supplement (ENSURE ENLIVE)  237 mL Oral BID BM  . glipiZIDE  5 mg Oral BID AC  . insulin aspart  0-15 Units Subcutaneous TID WC  . latanoprost  1 drop Both Eyes QHS  . magnesium oxide  400 mg Oral Daily  . metFORMIN  500 mg Oral BID WC  . metoCLOPramide (REGLAN) injection  10 mg Intravenous Q6H  . nutrition supplement (JUVEN)  1 packet Oral BID BM  . pantoprazole  80 mg Oral Daily  . senna-docusate  1 tablet Oral QHS  . sodium chloride flush  10-40 mL Intracatheter Q12H   Continuous Infusions: . sodium chloride 10 mL/hr at 05/14/19 2036  . sodium chloride    . phenylephrine (NEO-SYNEPHRINE) Adult infusion Stopped (05/13/19 1800)   PRN Meds: ipratropium-albuterol, meclizine, ondansetron (ZOFRAN) IV, oxyCODONE, sodium chloride flush, tiZANidine, traMADol   Vital Signs    Vitals:   05/15/19 0323 05/15/19 0435 05/15/19 0753 05/15/19 1055  BP: (!) 122/53  (!) 128/59 (!) 147/54  Pulse: 81  89 88  Resp: (!) 25 (!) 28 20 (!) 26  Temp: 97.9 F (36.6 C)  97.7 F (36.5 C) 97.9 F (36.6 C)  TempSrc: Oral  Oral Oral  SpO2: 100%  92% 94%  Weight:      Height:        Intake/Output Summary (Last 24 hours) at 05/15/2019 1103 Last data filed at 05/15/2019 0800 Gross per 24 hour  Intake 70 ml  Output  2870 ml  Net -2800 ml   Filed Weights   05/13/19 0612 05/13/19 0619 05/14/19 0600  Weight: 85.6 kg 86.2 kg 89 kg    Telemetry    Sinus rhythm.  Personally reviewed.  ECG    An ECG dated 05/13/2019 was personally reviewed today and demonstrated:  Marked sinus bradycardia with nonspecific T wave changes and probable old inferior infarct pattern.  Physical Exam   GEN: No acute distress.   Neck: No JVD. Cardiac: RRR, no gallop. Thorax: Chest tube in place. Respiratory: Nonlabored. Clear to auscultation bilaterally. GI: Soft, nontender, bowel sounds present.   Labs    Chemistry Recent Labs  Lab 05/12/19 1215 05/13/19 1044 05/13/19 1058 05/13/19 1058 05/14/19 0431 05/14/19 0433 05/15/19 1009  NA 140   < > 139   < > 138 139 136  K 4.4   < > 5.2*   < > 4.4 4.7 4.1  CL 105  --  106  --  104  --  101  CO2 22  --   --   --  24  --  24  GLUCOSE 147*  --  212*  --  142*  --  276*  BUN 21  --  20  --  20  --  25*  CREATININE 1.26*  --  1.30*  --  1.29*  --  1.23*  CALCIUM 10.0  --   --   --  8.4*  --  8.8*  PROT 7.2  --   --   --   --   --  5.7*  ALBUMIN 3.8  --   --   --   --   --  2.7*  AST 18  --   --   --   --   --  42*  ALT 17  --   --   --   --   --  35  ALKPHOS 40  --   --   --   --   --  37*  BILITOT 0.7  --   --   --   --   --  0.9  GFRNONAA 42*  --   --   --  40*  --  43*  GFRAA 48*  --   --   --  47*  --  50*  ANIONGAP 13  --   --   --  10  --  11   < > = values in this interval not displayed.     Hematology Recent Labs  Lab 05/13/19 1254 05/13/19 1254 05/14/19 0431 05/14/19 0433 05/15/19 1009  WBC 10.8*  --  10.6*  --  10.9*  RBC 2.75*  --  2.69*  --  2.75*  HGB 7.5*   < > 7.3* 7.8* 7.6*  HCT 24.9*   < > 24.1* 23.0* 24.2*  MCV 90.5  --  89.6  --  88.0  MCH 27.3  --  27.1  --  27.6  MCHC 30.1  --  30.3  --  31.4  RDW 18.6*  --  18.5*  --  18.2*  PLT 249  --  286  --  263   < > = values in this interval not displayed.    Radiology    DG Chest  Port 1 View  Result Date: 05/15/2019 CLINICAL DATA:  Status update EXAM: PORTABLE CHEST 1 VIEW COMPARISON:  Yesterday FINDINGS: Right IJ line with tip at the upper cavoatrial junction. Right chest tube with tip at the apex. Small right apical pneumothorax, 2 rib interspaces in height. Asymmetric right perihilar opacity after right-sided lobectomy. The left lung is clear. No visible effusion or pneumothorax. CABG with stable heart size. IMPRESSION: 1. Small right apical pneumothorax. 2. Right perihilar atelectasis. Electronically Signed   By: Monte Fantasia M.D.   On: 05/15/2019 07:30   DG CHEST PORT 1 VIEW  Result Date: 05/14/2019 CLINICAL DATA:  Follow-up chest tube. EXAM: PORTABLE CHEST 1 VIEW COMPARISON:  May 13, 2019 FINDINGS: The right chest tube continues to terminate in the right apex, stable. There is a small right apical pneumothorax measuring 10 mm, not seen yesterday. The right IJ is stable. Hila and mediastinum are unchanged. No pneumothorax on the left. No suspicious infiltrates identified within the lungs. IMPRESSION: 1. The right chest tube is in stable position. There is a small 10 mm right apical pneumothorax not seen yesterday. Recommend attention on follow-up. No other changes. Electronically Signed   By: Dorise Bullion III M.D   On: 05/14/2019 07:43   DG Chest Port 1 View  Result Date: 05/13/2019 CLINICAL DATA:  Prior lung surgery. EXAM: PORTABLE CHEST 1 VIEW COMPARISON:  05/12/2019. FINDINGS: Right IJ line noted with tip over  cavoatrial junction. Right chest tube noted with tip over right upper chest. Mild bibasilar subsegmental atelectasis and or scarring. Prior CABG. Heart size stable. Degenerative change thoracic spine. IMPRESSION: 1.  Right IJ line right chest tube in stable position. 2.  Mild bibasilar subsegmental atelectasis and or scarring. 3.  Prior CABG.  Heart size stable. Electronically Signed   By: Marcello Moores  Register   On: 05/13/2019 12:41    Cardiac Studies    Echocardiogram 11/18/2018: 1. The left ventricle has normal systolic function with an ejection  fraction of 60-65%. The cavity size was normal. Left ventricular diastolic  parameters were normal.  2. The right ventricle has mildly reduced systolic function. The cavity  was normal. There is no increase in right ventricular wall thickness.  Right ventricular systolic pressure is moderately elevated with an  estimated pressure of 49.5 mmHg.  3. The aortic valve is tricuspid. Mild calcification of the aortic valve.  Mild to moderate aortic annular calcification noted.  4. The mitral valve is degenerative. Mild thickening of the mitral valve  leaflet. Mild calcification of the mitral valve leaflet. Mitral valve  regurgitation is moderate by color flow Doppler. The MR jet is eccentric  posteriorly directed.  5. The tricuspid valve is grossly normal.  6. The aorta is normal unless otherwise noted.   Patient Profile     74 y.o. female with a history of CAD s/p CABG 2019 with postoperative afibrillation, ischemic cardiomyopathy, DVT/PE, bilateral carotid artery disease, iron deficiency anemia, arthritis, reflux, hypertension, hyperlipidemia, moderate MR, remote tobacco abuse, and type 2 diabetes.  He is status post right upper lobectomy with lymph node dissection, we are following due to documented bradycardia.  Assessment & Plan    1.  Sinus bradycardia, heart rate has improved significantly following discontinuation of Coreg 25 mg twice daily.  No evidence of heart block or pauses by review of telemetry.  2.  CAD status post CABG in 2019.  No active angina symptoms.  She continues on aspirin and Lipitor.  3.  Essential hypertension, on Norvasc and chlorthalidone.  Systolics ranging 686H to 140s.  4.  Status post robot-assisted thoracoscopy with right upper lobectomy and lymph node dissection, continues postoperative management per TCTS.  Resume Coreg at 6.25 mg twice daily, follow-up  ECG a.m.  Otherwise continue aspirin, Norvasc, Lipitor, and chlorthalidone.  Signed, Rozann Lesches, MD  05/15/2019, 11:03 AM

## 2019-05-15 NOTE — Op Note (Signed)
NAME: Karen Tate, MEDAGLIA MEDICAL RECORD QI:[id number] ACCOUNT [id number] DATE OF BIRTH:10-May-1945 FACILITY: [facility] LOCATION: [facility] PHYSICIAN:Vickie Melnik Chaya Jan, MD  OPERATIVE REPORT  DATE OF PROCEDURE:  05/13/2019  PREOPERATIVE DIAGNOSIS:  Right upper lobe lung nodule, suspected T1 N0, stage IA nonsmall-cell carcinoma.  POSTOPERATIVE DIAGNOSIS:  Adenocarcinoma, right upper lobe, clinical stage IA (T1, N0).  PROCEDURE:   Xi robotic-assisted right thoracoscopy Right upper lobectomy, Mediastinal lymph node dissection, and Intercostal nerve blocks.  SURGEON:  Modesto Charon, MD  ASSISTANT:  Melodie Bouillon, MD  SECOND ASSISTANT:  Ellwood Handler, PA-C  ANESTHESIA:  General.  FINDINGS:  Nodule in the central portion of right upper lobe.  Frozen section revealed adenocarcinoma.  Bronchial margin was free of tumor.  Normal-appearing lymph nodes.  CLINICAL NOTE:  Karen Tate is a 74 year old woman with a history of tobacco abuse who recently was found to have a new lung nodule on CT.  The nodule increased in size from October to Tate, and on PET CT the nodule was hypermetabolic.  Given the high likelihood of this being a primary bronchogenic carcinoma, she was advised to undergo surgical resection.  The indications, risks, benefits, and alternatives were discussed in detail with the patient.  She understood and accepted the risks and agreed to proceed.  OPERATIVE NOTE:  Karen Tate was brought to the preoperative holding area on 05/13/2019.  Anesthesia placed a central venous catheter and an arterial blood pressure monitoring line.  She was taken to the operating room, anesthetized, and intubated with a  double-lumen endotracheal tube.  She was noted to be bradycardic.  Intravenous antibiotics were administered.  A Foley catheter was placed.  Sequential compression devices were placed on the calves for DVT prophylaxis.  A Bair Hugger was placed to  maintain body temperature.  She was  placed in a left lateral decubitus position, and the right chest was prepped and draped in the usual sterile fashion.  Single-lung ventilation of the left lung was initiated and was tolerated well throughout the  procedure.  A timeout was performed.  A solution containing 20 mL of liposomal bupivacaine, 30 mL of 0.5% bupivacaine, and 50 mL of saline was prepared.  This was used for local at the skin incisions as well as for the intercostal nerve blocks.  An incision was made  in the 8th interspace in the mid axillary line.  An 8 mm port was inserted and the thoracoscope was advanced into the chest.  There was good isolation of the right lung.  Carbon dioxide was insufflated per protocol to pressure of 10 mm Hg.  Intercostal nerve blocks then were  performed from the 3rd to the 10th interspace.  A needle was advanced from a posterior approach, and 10 mL of the bupivacaine solution was injected into a subpleural plane at each interspace while visualizing with the thoracoscope.  The  remaining port incisions for the robot were made.  All robotic trocars were placed in the 8th interspace, 12 mm ports were placed anterior and posterior to the camera port, and then an additional 8 mm port was placed posteriorly.  A 12 mm assistant port was made  in the 10th interspace centered between the 2 anterior ports.  The robot was docked and the dissection was performed from the console with Dr. Kipp Brood and Ellwood Handler at the patient's bedside.  The utilization of the 4th retraction arm was somewhat limited due to the patient's body habitus.  The lower lobe was retracted superiorly, and the inferior pulmonary  ligament was divided with bipolar cautery. There were some adhesions of the lower lobe to the diaphragm that were divided with the endoscopic robotic stapler. The pleural reflection was then divided at  the hilum posteriorly.  Lymph node dissection was performed as the pleural reflection was divided.  Level 9, 8,  and 7 nodes were removed at this time.  The pleural reflection was divided at the hilum anteriorly.  The phrenic nerve was identified and  preserved.  Finally, the pleural reflection was divided between the azygos vein and the pulmonary vessels superiorly, and level 10 and 4 nodes were removed.  Additional nodes that were removed during the dissection of the vessels and the airways were  also sent as separate specimens for permanent pathology.  While dissecting out the most superior branch of the superior pulmonary vein, some minor bleeding was encountered.  This was controlled and then the superior most 2 branches of the pulmonary vein  were divided with the vascular stapler with no ongoing bleeding.  At this point, the dissection was carried back to the fissure, and the fissure was completed anteriorly.  The dissection then was carried into the fissure.  The pulmonary artery was identified.  Dissection was carried out lateral to  the pulmonary artery.  The pleura was divided and then a plane was developed.  The superior segmental branch of the pulmonary artery to the lower lobe was identified and then the dissection proceeded from there.  The remainder of the major fissure  posteriorly was completed with sequential firings of the robotic stapler working from an anterior to posterior.  Blue cartridges were used on the stapler for this.  Given the central location of the nodule and relatively small size of the upper lobe, it  was felt that a wedge resection would leave the remainder of the upper lobe essentially unusable, and the decision was made to proceed with lobectomy.  The remaining branches of the superior pulmonary vein from the upper lobe then were divided with the  vascular stapler.  The middle lobe vein branch was identified and preserved.  The minor fissure was relatively complete, and it was dissected out with bipolar cautery.  No stapling was required.  This exposed the very small posterior  ascending branch of  the pulmonary artery, which was encircled and divided with the robotic vascular stapler.  Next, attention was turned to the remaining pulmonary artery branches.  There were 3 large branches that arose in relatively close proximity.  From a technical  standpoint, it was easier to divide these large branches individually rather than trying to divide the common trunk and then the third vessel separately.  Each of these vessels was dissected out, encircled, and divided with the robotic vascular stapler.   The stapler then was placed across the base of the right upper lobe bronchus.  A test inflation showed aeration of the lower and middle lobes.  The stapler was fired, transecting the right upper lobe bronchus.  An endoscopic retrieval bag was  placed from the assistant's port, and the upper lobe was placed into the bag.  All the staple lines were inspected, and there was good hemostasis.  The robot was undocked.  The anterior 8th interspace port incision was enlarged enough to allow removal of the lobectomy specimen.  The lobe was sent for frozen section of the nodule and bronchial margin.  The nodule returned as an adenocarcinoma.  The bronchial margin was free of tumor.  The middle lobe was  tacked to the lower lobe with a No-Knife stapler to prevent torsion.  The chest was copiously irrigated with warm saline.  A test inflation to 30 cm of water revealed no air leak.  A 28-French chest tube was placed through the original port incision and  secured with a #1 silk suture.  Final inspection was made to ensure that all gauze sponges that had been placed during the procedure were removed.  A small piece of Surgicel was left in place in the right paratracheal space.  Dual-lung ventilation was resumed.  The thoracoscope was removed.  The remaining ports were removed.  The port incisions were closed in standard fashion.  The patient then was placed in a supine position.  She was extubated in the  operating  room and taken to the [facility] in good condition.  LN/NUANCE  D:05/14/2019 T:05/15/2019 JOB:[id number]

## 2019-05-16 ENCOUNTER — Inpatient Hospital Stay (HOSPITAL_COMMUNITY): Payer: Medicare Other

## 2019-05-16 LAB — GLUCOSE, CAPILLARY
Glucose-Capillary: 108 mg/dL — ABNORMAL HIGH (ref 70–99)
Glucose-Capillary: 154 mg/dL — ABNORMAL HIGH (ref 70–99)

## 2019-05-16 MED ORDER — CARVEDILOL 12.5 MG PO TABS: 12.5000 mg | ORAL_TABLET | Freq: Two times a day (BID) | ORAL | Status: DC

## 2019-05-16 MED ORDER — OXYCODONE HCL 5 MG PO TABS: 5.0000 mg | ORAL_TABLET | Freq: Four times a day (QID) | ORAL | 0 refills | Status: DC | PRN

## 2019-05-16 MED ORDER — CARVEDILOL 12.5 MG PO TABS: 12.5000 mg | ORAL_TABLET | Freq: Two times a day (BID) | ORAL | 1 refills | Status: DC

## 2019-05-16 NOTE — TOC Transition Note (Signed)
Transition of Care [facility]) - CM/SW Discharge Note   Patient Details  Name: Karen Tate MRN: [id number] Date of Birth: 05/08/1945  [facility] CM/SW Contact:  Carles Collet, RN Phone Number: 05/16/2019, 9:49 AM   Clinical Narrative:    Patient to DC today with home oxygen. Referral made to [place]. Tank will be delivered to the room, then concentrator set up at the house later today. Patient's daughter will provide transport home after oxygen has been delivered to room.    Final next level of care: Home/Self Care Barriers to Discharge: No Barriers Identified   Patient Goals and CMS Choice Patient states their goals for this hospitalization and ongoing recovery are:: to go home      Discharge Placement                       Discharge Plan and Services                DME Arranged: Oxygen DME Agency: [facility] Date DME Agency Contacted: 05/16/19 Time DME Agency Contacted: [phone] Representative spoke with at DME Agency: [place] Determinants of Health ([place]) Interventions     Readmission Risk Interventions No flowsheet data found.

## 2019-05-16 NOTE — Progress Notes (Addendum)
      [address]       [place] [address]             [phone]      3 Days Post-Op Procedure(s) (LRB): XI ROBOTIC ASSISTED THORASCOPY WITH RIGHT UPPER LOBECTOMY (Right) Lymph Node Dissection (Right) Intercostal Nerve Block (Right) Subjective: Feels okay this morning. Happy to be going home.   Objective: Vital signs in last 24 hours: Temp:  [97.9 F (36.6 C)-99.9 F (37.7 C)] 98.4 F (36.9 C) (02/21 0721) Pulse Rate:  [88-99] 99 (02/21 0721) Cardiac Rhythm: Normal sinus rhythm (02/21 0721) Resp:  [16-26] 21 (02/21 0721) BP: (121-147)/(54-69) 121/58 (02/21 0920) SpO2:  [92 %-99 %] 92 % (02/21 0721)     Intake/Output from previous day: 02/20 0701 - 02/21 0700 In: 720 [P.O.:720] Out: 1410 [Urine:1400; Chest Tube:10] Intake/Output this shift: No intake/output data recorded.  General appearance: alert, cooperative and no distress Heart: regular rate and rhythm, S1, S2 normal, no murmur, click, rub or gallop Lungs: clear to auscultation bilaterally Abdomen: soft, non-tender; bowel sounds normal; no masses,  no organomegaly Extremities: extremities normal, atraumatic, no cyanosis or edema Wound: clean and dry  Lab Results: Recent Labs    05/14/19 0431 05/14/19 0431 05/14/19 0433 05/15/19 1009  WBC 10.6*  --   --  10.9*  HGB 7.3*   < > 7.8* 7.6*  HCT 24.1*   < > 23.0* 24.2*  PLT 286  --   --  263   < > = values in this interval not displayed.   BMET:  Recent Labs    05/14/19 0431 05/14/19 0431 05/14/19 0433 05/15/19 1009  NA 138   < > 139 136  K 4.4   < > 4.7 4.1  CL 104  --   --  101  CO2 24  --   --  24  GLUCOSE 142*  --   --  276*  BUN 20  --   --  25*  CREATININE 1.29*  --   --  1.23*  CALCIUM 8.4*  --   --  8.8*   < > = values in this interval not displayed.    PT/INR: No results for input(s): LABPROT, INR in the last 72 hours. ABG    Component Value Date/Time   PHART 7.376 05/14/2019 0433   HCO3 25.4 05/14/2019 0433   TCO2 27 05/14/2019 0433   ACIDBASEDEF 5.0 (H) 05/13/2019 1044   O2SAT 97.0 05/14/2019 0433   CBG (last 3)  Recent Labs    05/15/19 1617 05/15/19 2102 05/16/19 0637  GLUCAP 144* 144* 108*    Assessment/Plan: S/P Procedure(s) (LRB): XI ROBOTIC ASSISTED THORASCOPY WITH RIGHT UPPER LOBECTOMY (Right) Lymph Node Dissection (Right) Intercostal Nerve Block (Right)  1. CV-NSR rate of 80s, BP well controlled. Continue ASA, statin, coreg per cards.  2. Pulm-CXR reviewed-no pneumothorax.  3. Renal-creatinine 1.23, stable. Electrolytes okay 4. H and H 7.6/24.2, Will trend. Expected acute blood loss anemia.  5. Endo-continue glucotrol and Metformin. Continue SSI  6. PCA pump, Oxycodone/Tramadol for pain control.  7. Continue lovenox for DVT prophylaxis  Plan: Home today. Discontinue central line. Home oxygen. Discharge instructions discussed with the patient.    LOS: 3 days    Elgie Collard 05/16/2019 Patient seen and examined, agree with above Dc home with short term PRN O2  Remo Lipps C. Roxan Hockey, MD [facility] [phone]

## 2019-05-16 NOTE — Progress Notes (Signed)
Progress Note  Patient Name: Karen Tate Date of Encounter: 05/16/2019  Primary Cardiologist: Kate Sable, MD  Subjective   Sitting in bedside chair.  Has had some lower abdominal cramping when she coughs.  No chest pain or breathlessness.  Inpatient Medications    Scheduled Meds: . acetaminophen  1,000 mg Oral Q6H   Or  . acetaminophen (TYLENOL) oral liquid 160 mg/5 mL  1,000 mg Oral Q6H  . ALPRAZolam  0.5 mg Oral BID  . amLODipine  10 mg Oral Daily  . aspirin EC  81 mg Oral Daily  . atorvastatin  80 mg Oral q1800  . bisacodyl  10 mg Oral Daily  . carvedilol  12.5 mg Oral BID WC  . Chlorhexidine Gluconate Cloth  6 each Topical Q0600  . chlorthalidone  25 mg Oral BID  . enoxaparin (LOVENOX) injection  40 mg Subcutaneous Daily  . feeding supplement (ENSURE ENLIVE)  237 mL Oral BID BM  . glipiZIDE  5 mg Oral BID AC  . insulin aspart  0-15 Units Subcutaneous TID WC  . latanoprost  1 drop Both Eyes QHS  . magnesium oxide  400 mg Oral Daily  . metFORMIN  500 mg Oral BID WC  . metoCLOPramide (REGLAN) injection  10 mg Intravenous Q6H  . nutrition supplement (JUVEN)  1 packet Oral BID BM  . pantoprazole  80 mg Oral Daily  . senna-docusate  1 tablet Oral QHS  . sodium chloride flush  10-40 mL Intracatheter Q12H   Continuous Infusions: . sodium chloride 10 mL/hr at 05/14/19 2036  . sodium chloride    . phenylephrine (NEO-SYNEPHRINE) Adult infusion Stopped (05/13/19 1800)   PRN Meds: ipratropium-albuterol, meclizine, ondansetron (ZOFRAN) IV, oxyCODONE, sodium chloride flush, tiZANidine, traMADol   Vital Signs    Vitals:   05/15/19 2009 05/15/19 2318 05/16/19 0330 05/16/19 0721  BP: (!) 146/62 (!) 141/69 (!) 136/59 127/63  Pulse:    99  Resp:    (!) 21  Temp: 99.9 F (37.7 C) 98.5 F (36.9 C) 97.9 F (36.6 C) 98.4 F (36.9 C)  TempSrc: Oral Oral Oral Oral  SpO2:    92%  Weight:      Height:        Intake/Output Summary (Last 24 hours) at 05/16/2019  0853 Last data filed at 05/15/2019 1700 Gross per 24 hour  Intake 480 ml  Output 900 ml  Net -420 ml   Filed Weights   05/13/19 0612 05/13/19 0619 05/14/19 0600  Weight: 85.6 kg 86.2 kg 89 kg    Telemetry    Sinus rhythm and sinus tachycardia, no pauses.  Personally reviewed.  ECG    I personally reviewed the tracing from 05/16/2019 which shows sinus tachycardia with nonspecific T wave changes.  Physical Exam   GEN:  Elderly woman.  No acute distress.   Neck: No JVD. Cardiac: RRR, soft systolic murmur, no gallop. Respiratory: Nonlabored. Clear to auscultation bilaterally.  Chest tube removed yesterday. GI: Soft, nontender, bowel sounds present.   Labs    Chemistry Recent Labs  Lab 05/12/19 1215 05/13/19 1044 05/13/19 1058 05/13/19 1058 05/14/19 0431 05/14/19 0433 05/15/19 1009  NA 140   < > 139   < > 138 139 136  K 4.4   < > 5.2*   < > 4.4 4.7 4.1  CL 105  --  106  --  104  --  101  CO2 22  --   --   --  24  --  24  GLUCOSE 147*  --  212*  --  142*  --  276*  BUN 21  --  20  --  20  --  25*  CREATININE 1.26*  --  1.30*  --  1.29*  --  1.23*  CALCIUM 10.0  --   --   --  8.4*  --  8.8*  PROT 7.2  --   --   --   --   --  5.7*  ALBUMIN 3.8  --   --   --   --   --  2.7*  AST 18  --   --   --   --   --  42*  ALT 17  --   --   --   --   --  35  ALKPHOS 40  --   --   --   --   --  37*  BILITOT 0.7  --   --   --   --   --  0.9  GFRNONAA 42*  --   --   --  40*  --  43*  GFRAA 48*  --   --   --  47*  --  50*  ANIONGAP 13  --   --   --  10  --  11   < > = values in this interval not displayed.     Hematology Recent Labs  Lab 05/13/19 1254 05/13/19 1254 05/14/19 0431 05/14/19 0433 05/15/19 1009  WBC 10.8*  --  10.6*  --  10.9*  RBC 2.75*  --  2.69*  --  2.75*  HGB 7.5*   < > 7.3* 7.8* 7.6*  HCT 24.9*   < > 24.1* 23.0* 24.2*  MCV 90.5  --  89.6  --  88.0  MCH 27.3  --  27.1  --  27.6  MCHC 30.1  --  30.3  --  31.4  RDW 18.6*  --  18.5*  --  18.2*  PLT 249   --  286  --  263   < > = values in this interval not displayed.    Radiology    DG Chest 2 View  Result Date: 05/16/2019 CLINICAL DATA:  Status post lobectomy. EXAM: CHEST - 2 VIEW COMPARISON:  05/15/2019 FINDINGS: There is no obvious right-sided pneumothorax. The right-sided central venous catheter is stable. There is elevation of the right hemidiaphragm. Hazy postoperative changes are noted in the right lung field. The patient is status post prior median sternotomy. The left lung field is essentially clear. There is no obvious acute osseous abnormality. IMPRESSION: 1. Lines and tubes as above. 2. No significant right-sided pneumothorax. 3. Stable postsurgical changes of the right hemithorax. Electronically Signed   By: Constance Holster M.D.   On: 05/16/2019 03:52   DG Chest 1V REPEAT Same Day  Result Date: 05/15/2019 CLINICAL DATA:  Post chest tube removal. EXAM: CHEST - 1 VIEW SAME DAY COMPARISON:  05/15/2019 FINDINGS: Interval removal right-sided chest tube. Stable tiny right apical pneumothorax. Right IJ central venous catheter unchanged. Lungs are adequately inflated with stable elevation right hemidiaphragm. Mild hazy opacification over the right perihilar region unchanged. Cardiomediastinal silhouette and remainder of the exam is unchanged. IMPRESSION: 1.  Stable hazy right perihilar opacification. 2. Removal of right-sided chest tube. Stable small right apical pneumothorax. Electronically Signed   By: Marin Olp M.D.   On: 05/15/2019 11:56   DG Chest Port 1 View  Result Date: 05/15/2019  CLINICAL DATA:  Status update EXAM: PORTABLE CHEST 1 VIEW COMPARISON:  Yesterday FINDINGS: Right IJ line with tip at the upper cavoatrial junction. Right chest tube with tip at the apex. Small right apical pneumothorax, 2 rib interspaces in height. Asymmetric right perihilar opacity after right-sided lobectomy. The left lung is clear. No visible effusion or pneumothorax. CABG with stable heart size.  IMPRESSION: 1. Small right apical pneumothorax. 2. Right perihilar atelectasis. Electronically Signed   By: Monte Fantasia M.D.   On: 05/15/2019 07:30    Cardiac Studies   Echocardiogram 11/18/2018: 1. The left ventricle has normal systolic function with an ejection  fraction of 60-65%. The cavity size was normal. Left ventricular diastolic  parameters were normal.  2. The right ventricle has mildly reduced systolic function. The cavity  was normal. There is no increase in right ventricular wall thickness.  Right ventricular systolic pressure is moderately elevated with an  estimated pressure of 49.5 mmHg.  3. The aortic valve is tricuspid. Mild calcification of the aortic valve.  Mild to moderate aortic annular calcification noted.  4. The mitral valve is degenerative. Mild thickening of the mitral valve  leaflet. Mild calcification of the mitral valve leaflet. Mitral valve  regurgitation is moderate by color flow Doppler. The MR jet is eccentric  posteriorly directed.  5. The tricuspid valve is grossly normal.  6. The aorta is normal unless otherwise noted.   Patient Profile     74 y.o. female with a history of CAD s/p CABG 2019 with postoperative afibrillation, ischemic cardiomyopathy, DVT/PE, bilateral carotid artery disease, iron deficiency anemia, arthritis, reflux, hypertension, hyperlipidemia, moderate MR, remote tobacco abuse, and type 2 diabetes.  He is status post right upper lobectomy with lymph node dissection, we are following due to documented bradycardia.  Assessment & Plan    1.  Sinus bradycardia, resolved with increasing heart rates since initial discontinuation of Coreg 25 mg twice daily.  Coreg 6.25 mg dose was resumed yesterday.  No evidence of heart block or pauses by review of telemetry.  2.  CAD status post CABG in 2019.  No active angina symptoms.  She continues on aspirin and Lipitor.  3.  Essential hypertension, on Norvasc and chlorthalidone.   Systolics ranging 765Y to 140s.  4.  Status post robot-assisted thoracoscopy with right upper lobectomy and lymph node dissection, continues postoperative management per TCTS.  Would increase Coreg to 12.5 mg twice daily, she may ultimately be able to get back to the previous outpatient dose.  No change in Norvasc or chlorthalidone for now.  Signed, Rozann Lesches, MD  05/16/2019, 8:53 AM

## 2019-05-16 NOTE — Progress Notes (Signed)
Removed central line and PIV access without any complication. Provided discharge instructions to pt's daughter & husband. Pt took her all belongings. HS [employer]

## 2019-05-16 NOTE — Progress Notes (Signed)
  Patient Saturations on Room Air at Rest = 97%  Patient Saturations on [employer] while Ambulating = 80%  Patient Saturations on 2 Liters of oxygen while Ambulating = 90%  Patient has SOB & RR increased 40's when she ambulates. Patient need O2 while she activities. HS [employer]

## 2019-05-17 LAB — BPAM RBC
Blood Product Expiration Date: 202103192359
Blood Product Expiration Date: 202103192359
ISSUE DATE / TIME: 202102181054
ISSUE DATE / TIME: 202102181054
Unit Type and Rh: 6200
Unit Type and Rh: 6200

## 2019-05-17 LAB — TYPE AND SCREEN
ABO/RH(D): A POS
Antibody Screen: NEGATIVE
Unit division: 0
Unit division: 0

## 2019-05-17 LAB — SURGICAL PATHOLOGY

## 2019-05-19 ENCOUNTER — Telehealth: Payer: Self-pay

## 2019-05-19 NOTE — Telephone Encounter (Signed)
Virtual Visit Pre-Appointment Phone Call  "(Name), I am calling you today to discuss your upcoming appointment. We are currently trying to limit exposure to the virus that causes COVID-19 by seeing patients at home rather than in the office."  1. "What is the BEST phone number to call the day of the visit?" - include this in appointment notes  2. "Do you have or have access to (through a family member/friend) a smartphone with video capability that we can use for your visit?" a. If yes - list this number in appt notes as "cell" (if different from BEST phone #) and list the appointment type as a VIDEO visit in appointment notes b. If no - list the appointment type as a PHONE visit in appointment notes  Confirm consent - "In the setting of the current Covid19 crisis, you are scheduled for a (phone or video) visit with your provider on (date) at (time).  Just as we do with many in-office visits, in order for you to participate in this visit, we must obtain consent.  If you'd like, I can send this to your mychart (if signed up) or email for you to review.  Otherwise, I can obtain your verbal consent now.  All virtual visits are billed to your insurance company just like a normal visit would be.  By agreeing to a virtual visit, we'd like you to understand that the technology does not allow for your provider to perform an examination, and thus may limit your provider's ability to fully assess your condition. If your provider identifies any concerns that need to be evaluated in person, we will make arrangements to do so.  Finally, though the technology is pretty good, we cannot assure that it will always work on either your or our end, and in the setting of a video visit, we may have to convert it to a phone-only visit.  In either situation, we cannot ensure that we have a secure connection.  Are you willing to proceed?" STAFF: Did the patient verbally acknowledge consent to telehealth visit? Document  YES/NO here:  3. Advise patient to be prepared - "Two hours prior to your appointment, go ahead and check your blood pressure, pulse, oxygen saturation, and your weight (if you have the equipment to check those) and write them all down. When your visit starts, your provider will ask you for this information. If you have an Apple Watch or Kardia device, please plan to have heart rate information ready on the day of your appointment. Please have a pen and paper handy nearby the day of the visit as well."  4. Give patient instructions for MyChart download to smartphone OR Doximity/Doxy.me as below if video visit (depending on what platform provider is using)  5. Inform patient they will receive a phone call 15 minutes prior to their appointment time (may be from unknown caller ID) so they should be prepared to answer    TELEPHONE CALL NOTE  LAYSA KIMMEY has been deemed a candidate for a follow-up tele-health visit to limit community exposure during the Covid-19 pandemic. I spoke with the patient via phone to ensure availability of phone/video source, confirm preferred email & phone number, and discuss instructions and expectations.  I reminded GLENISHA GUNDRY to be prepared with any vital sign and/or heart rhythm information that could potentially be obtained via home monitoring, at the time of her visit. I reminded DESERAE JENNINGS to expect a phone call prior to her visit.  Dorothey Baseman 05/19/2019 11:01 AM   INSTRUCTIONS FOR DOWNLOADING THE MYCHART APP TO SMARTPHONE  - The patient must first make sure to have activated MyChart and know their login information - If Apple, go to [employer] and type in MyChart in the search bar and download the app. If Android, ask patient to go to [employer] and type in [place] in the search bar and download the app. The app is free but as with any other app downloads, their phone may require them to verify saved payment information or Apple/Android password.   - The patient will need to then log into the app with their MyChart username and password, and select [facility] as their healthcare provider to link the account. When it is time for your visit, go to the MyChart app, find appointments, and click Begin Video Visit. Be sure to Select Allow for your device to access the Microphone and Camera for your visit. You will then be connected, and your provider will be with you shortly.  **If they have any issues connecting, or need assistance please contact MyChart service desk [phone])**  **If using a computer, in order to ensure the best quality for their visit they will need to use either of the following Internet Browsers: [employer], or Google Chrome**  IF USING DOXIMITY or DOXY.ME - The patient will receive a link just prior to their visit by text.     FULL LENGTH CONSENT FOR TELE-HEALTH VISIT   I hereby voluntarily request, consent and authorize [facility] and its employed or contracted physicians, physician assistants, nurse practitioners or other licensed health care professionals (the Practitioner), to provide me with telemedicine health care services (the "Services") as deemed necessary by the treating Practitioner. I acknowledge and consent to receive the Services by the Practitioner via telemedicine. I understand that the telemedicine visit will involve communicating with the Practitioner through live audiovisual communication technology and the disclosure of certain medical information by electronic transmission. I acknowledge that I have been given the opportunity to request an in-person assessment or other available alternative prior to the telemedicine visit and am voluntarily participating in the telemedicine visit.  I understand that I have the right to withhold or withdraw my consent to the use of telemedicine in the course of my care at any time, without affecting my right to future care or treatment, and that  the Practitioner or I may terminate the telemedicine visit at any time. I understand that I have the right to inspect all information obtained and/or recorded in the course of the telemedicine visit and may receive copies of available information for a reasonable fee.  I understand that some of the potential risks of receiving the Services via telemedicine include:  Marland Kitchen Delay or interruption in medical evaluation due to technological equipment failure or disruption; . Information transmitted may not be sufficient (e.g. poor resolution of images) to allow for appropriate medical decision making by the Practitioner; and/or  . In rare instances, security protocols could fail, causing a breach of personal health information.  Furthermore, I acknowledge that it is my responsibility to provide information about my medical history, conditions and care that is complete and accurate to the best of my ability. I acknowledge that Practitioner's advice, recommendations, and/or decision may be based on factors not within their control, such as incomplete or inaccurate data provided by me or distortions of diagnostic images or specimens that may result from electronic transmissions. I understand that the practice  of medicine is not an Chief Strategy Officer and that Practitioner makes no warranties or guarantees regarding treatment outcomes. I acknowledge that I will receive a copy of this consent concurrently upon execution via email to the email address I last provided but may also request a printed copy by calling the office of [facility].    I understand that my insurance will be billed for this visit.   I have read or had this consent read to me. . I understand the contents of this consent, which adequately explains the benefits and risks of the Services being provided via telemedicine.  . I have been provided ample opportunity to ask questions regarding this consent and the Services and have had my questions answered to  my satisfaction. . I give my informed consent for the services to be provided through the use of telemedicine in my medical care  By participating in this telemedicine visit I agree to the above.

## 2019-05-20 ENCOUNTER — Other Ambulatory Visit: Payer: Self-pay | Admitting: *Deleted

## 2019-05-20 NOTE — Progress Notes (Signed)
The proposed treatment discussed in cancer conference 05/20/19 is for discussion purpose only and is not a binding recommendation.  The patient was not physically examined nor present for their treatment options.  Therefore, final treatment plans cannot be discussed.

## 2019-05-27 DIAGNOSIS — Z23 Encounter for immunization: Secondary | ICD-10-CM | POA: Diagnosis not present

## 2019-05-27 DIAGNOSIS — D630 Anemia in neoplastic disease: Secondary | ICD-10-CM | POA: Diagnosis not present

## 2019-06-04 ENCOUNTER — Ambulatory Visit: Payer: Medicare Other

## 2019-06-04 ENCOUNTER — Telehealth: Payer: Self-pay

## 2019-06-04 ENCOUNTER — Telehealth: Payer: Self-pay | Admitting: Physician Assistant

## 2019-06-04 ENCOUNTER — Telehealth: Payer: Medicare Other | Admitting: Cardiovascular Disease

## 2019-06-04 DIAGNOSIS — Z7982 Long term (current) use of aspirin: Secondary | ICD-10-CM | POA: Diagnosis not present

## 2019-06-04 DIAGNOSIS — E876 Hypokalemia: Secondary | ICD-10-CM | POA: Diagnosis present

## 2019-06-04 DIAGNOSIS — Z7984 Long term (current) use of oral hypoglycemic drugs: Secondary | ICD-10-CM | POA: Diagnosis not present

## 2019-06-04 DIAGNOSIS — I10 Essential (primary) hypertension: Secondary | ICD-10-CM | POA: Diagnosis present

## 2019-06-04 DIAGNOSIS — D649 Anemia, unspecified: Secondary | ICD-10-CM | POA: Diagnosis present

## 2019-06-04 DIAGNOSIS — I2510 Atherosclerotic heart disease of native coronary artery without angina pectoris: Secondary | ICD-10-CM | POA: Diagnosis present

## 2019-06-04 DIAGNOSIS — J189 Pneumonia, unspecified organism: Secondary | ICD-10-CM | POA: Diagnosis not present

## 2019-06-04 DIAGNOSIS — K219 Gastro-esophageal reflux disease without esophagitis: Secondary | ICD-10-CM | POA: Diagnosis present

## 2019-06-04 DIAGNOSIS — Z85118 Personal history of other malignant neoplasm of bronchus and lung: Secondary | ICD-10-CM | POA: Diagnosis not present

## 2019-06-04 DIAGNOSIS — R079 Chest pain, unspecified: Secondary | ICD-10-CM | POA: Diagnosis not present

## 2019-06-04 DIAGNOSIS — I252 Old myocardial infarction: Secondary | ICD-10-CM | POA: Diagnosis not present

## 2019-06-04 DIAGNOSIS — Z955 Presence of coronary angioplasty implant and graft: Secondary | ICD-10-CM | POA: Diagnosis not present

## 2019-06-04 DIAGNOSIS — K449 Diaphragmatic hernia without obstruction or gangrene: Secondary | ICD-10-CM | POA: Diagnosis present

## 2019-06-04 DIAGNOSIS — R0602 Shortness of breath: Secondary | ICD-10-CM | POA: Diagnosis not present

## 2019-06-04 DIAGNOSIS — E785 Hyperlipidemia, unspecified: Secondary | ICD-10-CM | POA: Diagnosis present

## 2019-06-04 DIAGNOSIS — Z902 Acquired absence of lung [part of]: Secondary | ICD-10-CM | POA: Diagnosis not present

## 2019-06-04 DIAGNOSIS — F411 Generalized anxiety disorder: Secondary | ICD-10-CM | POA: Diagnosis present

## 2019-06-04 DIAGNOSIS — Z87891 Personal history of nicotine dependence: Secondary | ICD-10-CM | POA: Diagnosis not present

## 2019-06-04 DIAGNOSIS — Z86711 Personal history of pulmonary embolism: Secondary | ICD-10-CM | POA: Diagnosis not present

## 2019-06-04 DIAGNOSIS — E119 Type 2 diabetes mellitus without complications: Secondary | ICD-10-CM | POA: Diagnosis present

## 2019-06-04 DIAGNOSIS — Z20822 Contact with and (suspected) exposure to covid-19: Secondary | ICD-10-CM | POA: Diagnosis not present

## 2019-06-04 NOTE — Telephone Encounter (Signed)
Patient's daughter contacted our office with concerns of her mother.  Our nurse at the office spoke with the patient's daughter.  However, I contacted the patient's daughter to assess current problems/issues.   Her daughter states that her mother isn't doing very well.  She has complaints of fatigue, nausea, decreased appetite, decreased color, decreased energy level, gasping for breath.  She states the patient has felt poorly for the past 2 weeks, but her symptoms have gotten worse.  She denies fever.  The daughter states she has not been ambulating or doing much for the past several weeks since she has been feeling so poorly.    I instructed patient to take her mother to the Emergency room for evaluation as the patient lives in [place] and this will be the quickest means of evaluation. I am concerned with patient's history of PE/DVT this could be present in post surgical state and lack of mobility.  Also with recent lung surgery should could have a pleural effusion or pneumothorax.   The patients daughter was agreeable to this plan.    Ellwood Handler, PA-C

## 2019-06-04 NOTE — Telephone Encounter (Addendum)
Pt's daughter called office around 12:30 today, stating that pt is fatigued and short of breath. Also reports that patient doesn't have an appetite and hasn't been eating enough. Daughter says pt has been dealing w/ these issues since her surgery (s/p robotic assisted R upper lobectomy on 05/13/19), but her symptoms are now more pronounced. She reports that pt denies pain and states she is currently not in acute respiratory distress. Spoke w/ E. Barrett, PA regarding OV today, and she states she will call pt's daughter to assess situation.

## 2019-06-07 ENCOUNTER — Other Ambulatory Visit: Payer: Self-pay | Admitting: Thoracic Surgery (Cardiothoracic Vascular Surgery)

## 2019-06-07 DIAGNOSIS — Z902 Acquired absence of lung [part of]: Secondary | ICD-10-CM

## 2019-06-08 ENCOUNTER — Ambulatory Visit: Payer: Self-pay | Admitting: Thoracic Surgery (Cardiothoracic Vascular Surgery)

## 2019-06-17 DIAGNOSIS — J1529 Pneumonia due to other staphylococcus: Secondary | ICD-10-CM | POA: Diagnosis not present

## 2019-06-22 ENCOUNTER — Encounter: Payer: Self-pay | Admitting: Thoracic Surgery (Cardiothoracic Vascular Surgery)

## 2019-06-22 ENCOUNTER — Ambulatory Visit
Admission: RE | Admit: 2019-06-22 | Discharge: 2019-06-22 | Disposition: A | Discharge status: Home / Self Care | Payer: Medicare Other | Source: Ambulatory Visit | Attending: Thoracic Surgery (Cardiothoracic Vascular Surgery) | Admitting: Thoracic Surgery (Cardiothoracic Vascular Surgery)

## 2019-06-22 ENCOUNTER — Other Ambulatory Visit: Payer: Self-pay

## 2019-06-22 ENCOUNTER — Ambulatory Visit (INDEPENDENT_AMBULATORY_CARE_PROVIDER_SITE_OTHER): Payer: Self-pay | Admitting: Thoracic Surgery (Cardiothoracic Vascular Surgery)

## 2019-06-22 VITALS — BP 153/79 | HR 84 | Temp 97.3°F | Resp 18 | Ht 64.0 in | Wt 177.0 lb

## 2019-06-22 DIAGNOSIS — C3491 Malignant neoplasm of unspecified part of right bronchus or lung: Secondary | ICD-10-CM

## 2019-06-22 DIAGNOSIS — Z902 Acquired absence of lung [part of]: Secondary | ICD-10-CM

## 2019-06-22 DIAGNOSIS — R918 Other nonspecific abnormal finding of lung field: Secondary | ICD-10-CM | POA: Diagnosis not present

## 2019-06-22 MED ORDER — OXYCODONE HCL 5 MG PO TABS: 5.0000 mg | ORAL_TABLET | Freq: Four times a day (QID) | ORAL | 0 refills | Status: DC | PRN

## 2019-06-22 NOTE — Progress Notes (Signed)
[address]       [place] [address]             [phone]     HPI: Karen Tate returns for scheduled follow-up visit  Karen Tate is a 74 year old woman with a past history significant for tobacco abuse, CAD, postoperative atrial fibrillation, DVT, PE, arthritis, reflux, hypertension, hyperlipidemia, type 2 diabetes, glaucoma, and anxiety.  She was found to have a right upper lobe lung nodule that slowly increased in size.  On PET CT it was hypermetabolic with an SUV of 2.4.  I did a robotic right upper lobectomy on 05/13/2019.  Her initial postoperative course was uncomplicated and she went home on day 3.  About a month postop she called in complaining of shortness of breath.  She went to the ED at [facility].  She was admitted and treated for presumed pneumonia and dehydration.  Currently she complains of feeling tired, lack of energy, poor appetite, and some incisional pain.  She has been using oxycodone about twice daily for the incisional pain.  Past Medical History:  Diagnosis Date  . Anxiety neurosis   . Arthritis   . Asthma   . CAD (coronary artery disease)    a. 08/2017: abnormal nuc-> sent directly to [facility], NSTEMI, s/p CABG (left internal mammary artery to left anterior descending, sequential saphenous vein graft to ramus intermedius branches 1 and 2).  . Carotid artery disease ([place])   . Chronic anemia   . Dizziness   . GERD (gastroesophageal reflux disease)   . Glaucoma   . Hiatal hernia   . Hyperlipemia   . Hypertension   . Ischemic cardiomyopathy   . Joint pain   . Mitral regurgitation   . Postoperative atrial fibrillation ([place]) 08/2017  . Pulmonary embolus ([place])   . Type 2 diabetes mellitus ([place])     Current Outpatient Medications  Medication Sig Dispense Refill  . acetaminophen (TYLENOL) 500 MG tablet Take 1,000 mg by mouth every 6 (six) hours as needed for moderate pain.    Marland Kitchen ALPRAZolam (XANAX) 0.5 MG tablet Take 0.5 mg by mouth 2 (two)  times daily.    Marland Kitchen amLODipine (NORVASC) 10 MG tablet Take 10 mg by mouth daily.    Marland Kitchen aspirin 81 MG tablet Take 1 tablet (81 mg total) by mouth daily.    Marland Kitchen atorvastatin (LIPITOR) 80 MG tablet Take 1 tablet (80 mg total) by mouth daily at 6 PM. 90 tablet 3  . carvedilol (COREG) 12.5 MG tablet Take 1 tablet (12.5 mg total) by mouth 2 (two) times daily with a meal. 60 tablet 1  . chlorthalidone (HYGROTON) 25 MG tablet Take 25 mg by mouth 2 (two) times daily.     . fish oil-omega-3 fatty acids 1000 MG capsule Take 1 g by mouth 3 (three) times daily.     Marland Kitchen glipiZIDE (GLUCOTROL) 5 MG tablet Take 5 mg by mouth 2 (two) times daily before a meal.    . latanoprost (XALATAN) 0.005 % ophthalmic solution Place 1 drop into both eyes at bedtime.    . magnesium oxide (MAG-OX) 400 MG tablet Take 1 tablet (400 mg total) by mouth 2 (two) times daily. (Patient taking differently: Take 400 mg by mouth daily. ) 180 tablet 3  . meclizine (ANTIVERT) 25 MG tablet Take 25 mg by mouth 3 (three) times daily as needed for dizziness.    . metFORMIN (GLUCOPHAGE) 1000 MG tablet Take 1,000 mg by mouth  2 (two) times daily with a meal.    . Multiple Vitamins-Minerals (CENTRUM SILVER 50+WOMEN) TABS Take 1 tablet by mouth daily.     Marland Kitchen omeprazole (PRILOSEC) 40 MG capsule Take 40 mg by mouth daily.    Marland Kitchen oxyCODONE (OXY IR/ROXICODONE) 5 MG immediate release tablet Take 1 tablet (5 mg total) by mouth every 6 (six) hours as needed for moderate pain. 20 tablet 0  . tiZANidine (ZANAFLEX) 4 MG tablet Take 1 tablet (4 mg total) by mouth every 6 (six) hours as needed for muscle spasms. 60 tablet 0   No current facility-administered medications for this visit.    Physical Exam BP (!) 153/79 (BP Location: Left Arm, Patient Position: Sitting, Cuff Size: Normal)   Pulse 84   Temp (!) 97.3 F (36.3 C)   Resp 18   Ht 5\' 4"  (1.626 m)   Wt 177 lb (80.3 kg)   SpO2 98% Comment: RA  BMI 30.59 kg/m  74 year old woman in no acute distress Alert  and oriented x3, flat affect Lungs diminished at right base but otherwise clear with no rales or wheezing Incisions well-healed Cardiac regular rate and rhythm  Diagnostic Tests: CHEST - 2 VIEW  COMPARISON:  Portable exam of 06/07/2019  FINDINGS: Normal heart size post CABG.  Mediastinal contours and pulmonary vascularity normal.  Volume loss in RIGHT hemithorax with elevation of RIGHT diaphragm.  Resolution of loculated pleural effusion at RIGHT apex seen on previous exam.  Postsurgical scarring at RIGHT base.  LEFT lung clear.  No acute infiltrate, pleural effusion or pneumothorax.  IMPRESSION: Postsurgical changes and volume loss in the RIGHT chest.  No acute abnormalities.   Electronically Signed   By: Karen Tate M.D.   On: 06/22/2019 12:37 I personally reviewed the chest x-ray images and concur with the findings noted above  Impression: Karen Tate is a 74 year old woman who had a robotic right upper lobectomy for a stage Ia non-small cell carcinoma on 05/13/2019.  She is now about 6 weeks out from surgery.  She complains of some incisional pain and is still using oxycodone about twice a day.  She requested a refill as she is almost out.  I gave her a prescription for 20 additional tablets.  I recommended that she tried to minimize usage of that as it could be contributing to her general malaise and poor appetite.  Malaise and poor appetite-primary issues.  I encouraged her to increase her activity levels.  She is mostly just sitting around in the chair.  I advised her to start with just walking 5 minutes at a time twice daily to start with and then to gradually build up over the next couple of weeks.    She is requesting to see an oncologist in [place].  I am going to refer her to Dr. Everardo All for a consultation.  She should not need any adjuvant therapy, but will need continued follow-up  Plan: Return in 3 weeks  [name], MD [facility] [phone]

## 2019-06-23 DIAGNOSIS — G8918 Other acute postprocedural pain: Secondary | ICD-10-CM | POA: Insufficient documentation

## 2019-06-24 DIAGNOSIS — I10 Essential (primary) hypertension: Secondary | ICD-10-CM | POA: Diagnosis not present

## 2019-06-24 DIAGNOSIS — E1121 Type 2 diabetes mellitus with diabetic nephropathy: Secondary | ICD-10-CM | POA: Diagnosis not present

## 2019-06-29 ENCOUNTER — Inpatient Hospital Stay (HOSPITAL_COMMUNITY): Payer: Medicare Other | Attending: Hematology | Admitting: Hematology

## 2019-06-29 ENCOUNTER — Inpatient Hospital Stay (HOSPITAL_COMMUNITY): Payer: Medicare Other

## 2019-06-29 ENCOUNTER — Other Ambulatory Visit: Payer: Self-pay

## 2019-06-29 ENCOUNTER — Encounter (HOSPITAL_COMMUNITY): Payer: Self-pay | Admitting: Hematology

## 2019-06-29 DIAGNOSIS — N189 Chronic kidney disease, unspecified: Secondary | ICD-10-CM | POA: Diagnosis not present

## 2019-06-29 DIAGNOSIS — Z902 Acquired absence of lung [part of]: Secondary | ICD-10-CM | POA: Insufficient documentation

## 2019-06-29 DIAGNOSIS — E119 Type 2 diabetes mellitus without complications: Secondary | ICD-10-CM | POA: Insufficient documentation

## 2019-06-29 DIAGNOSIS — I10 Essential (primary) hypertension: Secondary | ICD-10-CM | POA: Insufficient documentation

## 2019-06-29 DIAGNOSIS — I2510 Atherosclerotic heart disease of native coronary artery without angina pectoris: Secondary | ICD-10-CM | POA: Diagnosis not present

## 2019-06-29 DIAGNOSIS — Z90710 Acquired absence of both cervix and uterus: Secondary | ICD-10-CM | POA: Diagnosis not present

## 2019-06-29 DIAGNOSIS — C3411 Malignant neoplasm of upper lobe, right bronchus or lung: Secondary | ICD-10-CM | POA: Insufficient documentation

## 2019-06-29 DIAGNOSIS — C3491 Malignant neoplasm of unspecified part of right bronchus or lung: Secondary | ICD-10-CM | POA: Insufficient documentation

## 2019-06-29 DIAGNOSIS — Z7982 Long term (current) use of aspirin: Secondary | ICD-10-CM | POA: Diagnosis not present

## 2019-06-29 DIAGNOSIS — Z8249 Family history of ischemic heart disease and other diseases of the circulatory system: Secondary | ICD-10-CM | POA: Diagnosis not present

## 2019-06-29 DIAGNOSIS — K5900 Constipation, unspecified: Secondary | ICD-10-CM | POA: Insufficient documentation

## 2019-06-29 DIAGNOSIS — Z87891 Personal history of nicotine dependence: Secondary | ICD-10-CM | POA: Diagnosis not present

## 2019-06-29 DIAGNOSIS — Z803 Family history of malignant neoplasm of breast: Secondary | ICD-10-CM | POA: Diagnosis not present

## 2019-06-29 DIAGNOSIS — I252 Old myocardial infarction: Secondary | ICD-10-CM | POA: Diagnosis not present

## 2019-06-29 DIAGNOSIS — Z8052 Family history of malignant neoplasm of bladder: Secondary | ICD-10-CM | POA: Insufficient documentation

## 2019-06-29 DIAGNOSIS — D649 Anemia, unspecified: Secondary | ICD-10-CM | POA: Diagnosis not present

## 2019-06-29 DIAGNOSIS — Z79899 Other long term (current) drug therapy: Secondary | ICD-10-CM | POA: Diagnosis not present

## 2019-06-29 DIAGNOSIS — R634 Abnormal weight loss: Secondary | ICD-10-CM | POA: Insufficient documentation

## 2019-06-29 DIAGNOSIS — Z7984 Long term (current) use of oral hypoglycemic drugs: Secondary | ICD-10-CM | POA: Insufficient documentation

## 2019-06-29 DIAGNOSIS — E785 Hyperlipidemia, unspecified: Secondary | ICD-10-CM | POA: Diagnosis not present

## 2019-06-29 DIAGNOSIS — Z86711 Personal history of pulmonary embolism: Secondary | ICD-10-CM

## 2019-06-29 DIAGNOSIS — J45909 Unspecified asthma, uncomplicated: Secondary | ICD-10-CM | POA: Insufficient documentation

## 2019-06-29 DIAGNOSIS — I4891 Unspecified atrial fibrillation: Secondary | ICD-10-CM | POA: Diagnosis not present

## 2019-06-29 DIAGNOSIS — Z833 Family history of diabetes mellitus: Secondary | ICD-10-CM | POA: Diagnosis not present

## 2019-06-29 LAB — CBC WITH DIFFERENTIAL/PLATELET
Abs Immature Granulocytes: 0.02 10*3/uL (ref 0.00–0.07)
Basophils Absolute: 0.1 10*3/uL (ref 0.0–0.1)
Basophils Relative: 1 %
Eosinophils Absolute: 0.1 10*3/uL (ref 0.0–0.5)
Eosinophils Relative: 2 %
HCT: 32.4 % — ABNORMAL LOW (ref 36.0–46.0)
Hemoglobin: 9.3 g/dL — ABNORMAL LOW (ref 12.0–15.0)
Immature Granulocytes: 0 %
Lymphocytes Relative: 39 %
Lymphs Abs: 2.1 10*3/uL (ref 0.7–4.0)
MCH: 26.2 pg (ref 26.0–34.0)
MCHC: 28.7 g/dL — ABNORMAL LOW (ref 30.0–36.0)
MCV: 91.3 fL (ref 80.0–100.0)
Monocytes Absolute: 0.6 10*3/uL (ref 0.1–1.0)
Monocytes Relative: 11 %
Neutro Abs: 2.6 10*3/uL (ref 1.7–7.7)
Neutrophils Relative %: 47 %
Platelets: 567 10*3/uL — ABNORMAL HIGH (ref 150–400)
RBC: 3.55 MIL/uL — ABNORMAL LOW (ref 3.87–5.11)
RDW: 18 % — ABNORMAL HIGH (ref 11.5–15.5)
WBC: 5.4 10*3/uL (ref 4.0–10.5)
nRBC: 0 % (ref 0.0–0.2)

## 2019-06-29 LAB — VITAMIN B12: Vitamin B-12: 138 pg/mL — ABNORMAL LOW (ref 180–914)

## 2019-06-29 LAB — RETICULOCYTES
Immature Retic Fract: 33 % — ABNORMAL HIGH (ref 2.3–15.9)
RBC.: 3.58 MIL/uL — ABNORMAL LOW (ref 3.87–5.11)
Retic Count, Absolute: 71.2 10*3/uL (ref 19.0–186.0)
Retic Ct Pct: 2 % (ref 0.4–3.1)

## 2019-06-29 LAB — FOLATE: Folate: 18.1 ng/mL (ref >5.9)

## 2019-06-29 LAB — LACTATE DEHYDROGENASE: LDH: 147 U/L (ref 98–192)

## 2019-06-29 LAB — IRON AND TIBC
Iron: 30 ug/dL (ref 28–170)
Saturation Ratios: 7 % — ABNORMAL LOW (ref 10.4–31.8)
TIBC: 411 ug/dL (ref 250–450)
UIBC: 381 ug/dL

## 2019-06-29 LAB — FERRITIN: Ferritin: 10 ng/mL — ABNORMAL LOW (ref 11–307)

## 2019-06-29 NOTE — Progress Notes (Signed)
[facility] NOTE  Patient Care Team: Neale Burly, MD as PCP - General (Internal Medicine) Herminio Commons, MD as PCP - Cardiology (Cardiology)  CHIEF COMPLAINTS/PURPOSE OF CONSULTATION:  Stage I right lung adenocarcinoma.  HISTORY OF PRESENTING ILLNESS:  Karen Tate 74 y.o. female is seen in consultation today at the request of Dr. Roxan Hockey for further management of right upper lobe adenocarcinoma.  She has a right upper lobe lung nodule which was followed with serial scans.  CT scan of the chest in February showed lung nodule measuring 13 x 10 mm, spiculated in the right upper lobe.  This has significantly enlarged compared to prior scan from October 2020.  PET scan on 04/26/2019 showed mildly hypermetabolic 1.1 cm right upper lobe pulmonary nodule with SUV 2.4.  No findings of adenopathy or metastatic disease.  She underwent right upper lobectomy on 05/13/2019 which showed 1.3 cm invasive adenocarcinoma negative visceral pleural invasion, negative lymphovascular invasion, margins negative.  Lymph nodes of multiple stations were also negative.  She is gradually recovering from surgery.  She is accompanied by her daughter today.  She uses oxygen on exertion.  She lost 20+ pounds since surgery.  She lives at home with her husband and is able to do all her household activities although it takes some time to do them.  She was an ex-smoker, quit smoking 40 years ago.  She smoked 1 pack/day for 20 years.  Family history significant for son with bladder cancer.  Mother had breast cancer and maternal uncle had lung cancer.  She denies any bleeding per rectum or melena.  Reports some soreness over the surgical site on the right lateral chest wall.  Chronic cough and shortness of breath have been stable.  Numbness in the fingers is also stable.  Experienced mild constipation since started taking iron pills.  MEDICAL HISTORY:  Past Medical History:  Diagnosis Date  . Anxiety  neurosis   . Arthritis   . Asthma   . CAD (coronary artery disease)    a. 08/2017: abnormal nuc-> sent directly to [facility], NSTEMI, s/p CABG (left internal mammary artery to left anterior descending, sequential saphenous vein graft to ramus intermedius branches 1 and 2).  . Carotid artery disease ([place])   . Chronic anemia   . Dizziness   . GERD (gastroesophageal reflux disease)   . Glaucoma   . Heart disease   . Hiatal hernia   . Hyperlipemia   . Hypertension   . Ischemic cardiomyopathy   . Joint pain   . Mitral regurgitation   . Postoperative atrial fibrillation ([place]) 08/2017  . Pulmonary embolus ([place])   . Type 2 diabetes mellitus ([place])     SURGICAL HISTORY: Past Surgical History:  Procedure Laterality Date  . BACK SURGERY  2016  . CORONARY ARTERY BYPASS GRAFT N/A 09/18/2017   Procedure: CORONARY ARTERY BYPASS GRAFTING (CABG);  Surgeon: [name], MD;  Location: [place];  Service: Open Heart Surgery;  Laterality: N/A;  Saphenous vein harvest. LIMA to LAD Saphenous vein (sequential) ramus 1 & 2  . EYE SURGERY Bilateral    "surgery for glaucoma"  . HERNIA REPAIR    . INTERCOSTAL NERVE BLOCK Right 05/13/2019   Procedure: Intercostal Nerve Block;  Surgeon: [name], MD;  Location: [place];  Service: Thoracic;  Laterality: Right;  . LEFT HEART CATH AND CORONARY ANGIOGRAPHY N/A 09/15/2017   Procedure: LEFT HEART CATH AND CORONARY ANGIOGRAPHY;  Surgeon: Lorretta Harp, MD;  Location: [place] [facility];  Service: Cardiovascular;  Laterality: N/A;  . LYMPH NODE DISSECTION Right 05/13/2019   Procedure: Lymph Node Dissection;  Surgeon: [name], MD;  Location: [place];  Service: Thoracic;  Laterality: Right;  . ROTATOR CUFF REPAIR Left    x2  . SHOULDER SURGERY     LEFT  . TEE WITHOUT CARDIOVERSION N/A 09/18/2017   Procedure: TRANSESOPHAGEAL ECHOCARDIOGRAM (TEE);  Surgeon: [name], MD;  Location: [place];  Service: Open Heart Surgery;   Laterality: N/A;  . TOTAL KNEE ARTHROPLASTY     LEFT  . VAGINAL HYSTERECTOMY     partial    SOCIAL HISTORY: Social History   Socioeconomic History  . Marital status: Married    Spouse name: [name]  . Number of children: 5  . Years of education: Not on file  . Highest education level: Not on file  Occupational History  . Not on file  Tobacco Use  . Smoking status: Former Smoker    Packs/day: 1.25    Years: 16.00    Pack years: 20.00    Types: Cigarettes    Quit date: 03/25/1978    Years since quitting: 41.2  . Smokeless tobacco: Never Used  . Tobacco comment: quit more than 30 years ago  Substance and Sexual Activity  . Alcohol use: No  . Drug use: No  . Sexual activity: Not on file  Other Topics Concern  . Not on file  Social History Narrative  . Not on file   Social Determinants of Health   Financial Resource Strain: Low Risk   . Difficulty of Paying Living Expenses: Not hard at all  Food Insecurity: No Food Insecurity  . Worried About Charity fundraiser in the Last Year: Never true  . Ran Out of Food in the Last Year: Never true  Transportation Needs: No Transportation Needs  . Lack of Transportation (Medical): No  . Lack of Transportation (Non-Medical): No  Physical Activity: Inactive  . Days of Exercise per Week: 0 days  . Minutes of Exercise per Session: 0 min  Stress: No Stress Concern Present  . Feeling of Stress : Not at all  Social Connections: Slightly Isolated  . Frequency of Communication with Friends and Family: More than three times a week  . Frequency of Social Gatherings with Friends and Family: More than three times a week  . Attends Religious Services: More than 4 times per year  . Active Member of Clubs or Organizations: No  . Attends Archivist Meetings: Never  . Marital Status: Married  Human resources officer Violence: Not At Risk  . Fear of Current or Ex-Partner: No  . Emotionally Abused: No  . Physically Abused: No  . Sexually  Abused: No    FAMILY HISTORY: Family History  Problem Relation Age of Onset  . Diabetes Father   . Heart disease Father   . Breast cancer Mother   . Congestive Heart Failure Brother   . Anemia Daughter   . Seizures Daughter   . Migraines Daughter   . Diabetes Daughter   . Hypertension Daughter   . Bladder Cancer Son   . Congestive Heart Failure Son   . Congestive Heart Failure Son     ALLERGIES:  is allergic to beta adrenergic blockers; calcium channel blockers; and naproxen.  MEDICATIONS:  Current Outpatient Medications  Medication Sig Dispense Refill  . ALPRAZolam (XANAX) 0.5 MG tablet Take 0.5 mg by mouth 2 (two) times daily.    Marland Kitchen  amLODipine (NORVASC) 10 MG tablet Take 10 mg by mouth daily.    Marland Kitchen aspirin 81 MG tablet Take 1 tablet (81 mg total) by mouth daily.    Marland Kitchen atorvastatin (LIPITOR) 80 MG tablet Take 1 tablet (80 mg total) by mouth daily at 6 PM. 90 tablet 3  . carvedilol (COREG) 12.5 MG tablet Take 1 tablet (12.5 mg total) by mouth 2 (two) times daily with a meal. 60 tablet 1  . chlorthalidone (HYGROTON) 25 MG tablet Take 25 mg by mouth 2 (two) times daily.     . ferrous sulfate 325 (65 FE) MG tablet Take 325 mg by mouth 2 (two) times daily.    . fish oil-omega-3 fatty acids 1000 MG capsule Take 1 g by mouth 3 (three) times daily.     Marland Kitchen glipiZIDE (GLUCOTROL) 5 MG tablet Take 5 mg by mouth 2 (two) times daily before a meal.    . latanoprost (XALATAN) 0.005 % ophthalmic solution Place 1 drop into both eyes at bedtime.    . magnesium oxide (MAG-OX) 400 MG tablet Take 1 tablet (400 mg total) by mouth 2 (two) times daily. (Patient taking differently: Take 400 mg by mouth daily. ) 180 tablet 3  . metFORMIN (GLUCOPHAGE) 1000 MG tablet Take 1,000 mg by mouth 2 (two) times daily with a meal.    . Multiple Vitamins-Minerals (CENTRUM SILVER 50+WOMEN) TABS Take 1 tablet by mouth daily.     Marland Kitchen omeprazole (PRILOSEC) 40 MG capsule Take 40 mg by mouth daily.    Marland Kitchen acetaminophen (TYLENOL)  500 MG tablet Take 1,000 mg by mouth every 6 (six) hours as needed for moderate pain.    . meclizine (ANTIVERT) 25 MG tablet Take 25 mg by mouth 3 (three) times daily as needed for dizziness.    Marland Kitchen oxyCODONE (OXY IR/ROXICODONE) 5 MG immediate release tablet Take 1 tablet (5 mg total) by mouth every 6 (six) hours as needed for moderate pain. (Patient not taking: Reported on 06/29/2019) 20 tablet 0  . tiZANidine (ZANAFLEX) 4 MG tablet Take 1 tablet (4 mg total) by mouth every 6 (six) hours as needed for muscle spasms. (Patient not taking: Reported on 06/29/2019) 60 tablet 0   No current facility-administered medications for this visit.    REVIEW OF SYSTEMS:   Constitutional: Denies fevers, chills or abnormal night sweats Eyes: Denies blurriness of vision, double vision or watery eyes Ears, nose, mouth, throat, and face: Denies mucositis or sore throat Respiratory: Positive for cough and shortness of breath. Cardiovascular: Denies palpitation, chest discomfort or lower extremity swelling Gastrointestinal: Positive for constipation.  Denies any bleeding per rectum or melena.  Occasional nausea present. Skin: Denies abnormal skin rashes Lymphatics: Denies new lymphadenopathy or easy bruising Neurological: Positive for numbness in the fingertips.  No focal weakness. Behavioral/Psych: Mood is stable, no new changes  All other systems were reviewed with the patient and are negative.  PHYSICAL EXAMINATION: ECOG PERFORMANCE STATUS: 1 - Symptomatic but completely ambulatory  Vitals:   06/29/19 1004  BP: (!) 148/63  Pulse: 73  Resp: 18  Temp: (!) 96.9 F (36.1 C)  SpO2: 100%   Filed Weights   06/29/19 1004  Weight: 177 lb 1.6 oz (80.3 kg)    GENERAL:alert, no distress and comfortable SKIN: skin color, texture, turgor are normal, no rashes or significant lesions EYES: normal, conjunctiva are pink and non-injected, sclera clear OROPHARYNX:no exudate, no erythema and lips, buccal mucosa, and  tongue normal  NECK: supple, thyroid normal size, non-tender,  without nodularity LYMPH:  no palpable lymphadenopathy in the cervical, axillary or inguinal LUNGS: clear to auscultation and percussion with normal breathing effort.  Right lateral chest wall incisions have healed well.  They are tender to palpation. HEART: regular rate & rhythm and no murmurs and no lower extremity edema ABDOMEN:abdomen soft, non-tender and normal bowel sounds Musculoskeletal:no cyanosis of digits and no clubbing  PSYCH: alert & oriented x 3 with fluent speech NEURO: no focal motor/sensory deficits  LABORATORY DATA:  I have reviewed the data as listed Lab Results  Component Value Date   WBC 5.4 06/29/2019   HGB 9.3 (L) 06/29/2019   HCT 32.4 (L) 06/29/2019   MCV 91.3 06/29/2019   PLT 567 (H) 06/29/2019     Chemistry      Component Value Date/Time   NA 136 05/15/2019 1009   K 4.1 05/15/2019 1009   CL 101 05/15/2019 1009   CO2 24 05/15/2019 1009   BUN 25 (H) 05/15/2019 1009   CREATININE 1.23 (H) 05/15/2019 1009      Component Value Date/Time   CALCIUM 8.8 (L) 05/15/2019 1009   ALKPHOS 37 (L) 05/15/2019 1009   AST 42 (H) 05/15/2019 1009   ALT 35 05/15/2019 1009   BILITOT 0.9 05/15/2019 1009       RADIOGRAPHIC STUDIES: I have personally reviewed the radiological images as listed and agreed with the findings in the report. DG Chest 2 View  Result Date: 06/22/2019 CLINICAL DATA:  Post lobectomy EXAM: CHEST - 2 VIEW COMPARISON:  Portable exam of 06/07/2019 FINDINGS: Normal heart size post CABG. Mediastinal contours and pulmonary vascularity normal. Volume loss in RIGHT hemithorax with elevation of RIGHT diaphragm. Resolution of loculated pleural effusion at RIGHT apex seen on previous exam. Postsurgical scarring at RIGHT base. LEFT lung clear. No acute infiltrate, pleural effusion or pneumothorax. IMPRESSION: Postsurgical changes and volume loss in the RIGHT chest. No acute abnormalities.  Electronically Signed   By: Lavonia Dana M.D.   On: 06/22/2019 12:37    ASSESSMENT & PLAN:  Non-small cell cancer of right lung (HCC) 1.  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 -PET CT scan on 04/26/2019 showed mildly hypermetabolic 1.1 cm right upper lobe pulmonary nodule with SUV 2.4.  No findings of adenopathy or metastatic disease. -Right upper lobectomy on 05/13/2019 shows 1.3 cm invasive adenocarcinoma, negative for visceral pleural invasion, LVI negative, margins negative.  Lymph nodes from stations 7, 11 R, 10 R, 4R, 12R are negative. -She is recovering well from surgery.  We discussed the normal prognosis of stage I lung cancer.  She does not merit adjuvant chemotherapy. -We discussed surveillance with H&P and CT of the chest with contrast every 6 months for 2 to 3 years followed by low-dose noncontrast enhanced chest CT annually. -We will set her up for CT scan of the chest with contrast in 4 months.  2.  Normocytic anemia: -Her previous hemoglobin was 7.6 with MCV of 88. -She has mild degree of chronic kidney disease which is likely contributing to her anemia. -She denies any bleeding per rectum or melena.  She reportedly had colonoscopy earlier this year which was reportedly normal. -She started taking iron tablet once daily a month ago.  She has some constipation.  I have asked her to start taking stool softener. -  We will do work-up for anemia including ferritin, iron panel, E99, folic acid, methylmalonic acid, copper levels.  We will also check serum protein electrophoresis.  Will check LDH and reticulocyte count. -We have talked to her about starting parenteral iron therapy if she does not get good response with oral iron therapy.  We talked about Feraheme weekly x2 and its side effects.  She  has minor side effects from couple of other medications.  She does not require any premedication with steroids. -We will discuss further plan next week.  3.  Weight loss: -She lost about 20-25 pounds since her surgery. -She is slowly gaining back.  Her appetite is improving.  Orders Placed This Encounter  Procedures  . CT Chest W Contrast    Standing Status:   Future    Standing Expiration Date:   06/28/2020    Order Specific Question:   ** REASON FOR EXAM (FREE TEXT)    Answer:   lung cancer    Order Specific Question:   If indicated for the ordered procedure, I authorize the administration of contrast media per Radiology protocol    Answer:   Yes    Order Specific Question:   Preferred imaging location?    Answer:   [facility]    Order Specific Question:   Radiology Contrast Protocol - do NOT remove file path    Answer:   [email or web address]  . Lactate dehydrogenase    Standing Status:   Future    Number of Occurrences:   1    Standing Expiration Date:   06/28/2020  . Reticulocytes    Standing Status:   Future    Number of Occurrences:   1    Standing Expiration Date:   06/28/2020  . Copper, serum    Standing Status:   Future    Number of Occurrences:   1    Standing Expiration Date:   06/28/2020  . Protein electrophoresis, serum    Standing Status:   Future    Number of Occurrences:   1    Standing Expiration Date:   06/28/2020  . Methylmalonic acid, serum    Standing Status:   Future    Number of Occurrences:   1    Standing Expiration Date:   06/28/2020  . Folate    Standing Status:   Future    Standing Expiration Date:   06/28/2020  . Vitamin B12    Standing Status:   Future    Standing Expiration Date:   06/28/2020  . Iron and TIBC    Standing Status:   Future    Standing Expiration Date:   06/28/2020  . Ferritin    Standing Status:   Future    Standing Expiration Date:   06/28/2020  . CBC with Differential    Standing Status:   Future     Standing Expiration Date:   06/28/2020  . CBC with Differential    Standing Status:   Future    Number of Occurrences:   1    Standing Expiration Date:   06/28/2020  . Ferritin    Standing Status:   Future    Number of Occurrences:   1    Standing Expiration Date:   06/28/2020  . Iron and TIBC    Standing Status:   Future    Number of Occurrences:   1    Standing Expiration Date:   06/28/2020  . Vitamin B12    Standing Status:  Future    Number of Occurrences:   1    Standing Expiration Date:   06/28/2020  . Folate    Standing Status:   Future    Number of Occurrences:   1    Standing Expiration Date:   06/28/2020    All questions were answered. The patient knows to call the clinic with any problems, questions or concerns.      Derek Jack, MD 06/29/2019 12:36 PM

## 2019-06-29 NOTE — Assessment & Plan Note (Addendum)
1.  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 -PET CT scan on 04/26/2019 showed mildly hypermetabolic 1.1 cm right upper lobe pulmonary nodule with SUV 2.4.  No findings of adenopathy or metastatic disease. -Right upper lobectomy on 05/13/2019 shows 1.3 cm invasive adenocarcinoma, negative for visceral pleural invasion, LVI negative, margins negative.  Lymph nodes from stations 7, 11 R, 10 R, 4R, 12R are negative. -She is recovering well from surgery.  We discussed the normal prognosis of stage I lung cancer.  She does not merit adjuvant chemotherapy. -We discussed surveillance with H&P and CT of the chest with contrast every 6 months for 2 to 3 years followed by low-dose noncontrast enhanced chest CT annually. -We will set her up for CT scan of the chest with contrast in 4 months.  2.  Normocytic anemia: -Her previous hemoglobin was 7.6 with MCV of 88. -She has mild degree of chronic kidney disease which is likely contributing to her anemia. -She denies any bleeding per rectum or melena.  She reportedly had colonoscopy earlier this year which was reportedly normal. -She started taking iron tablet once daily a month ago.  She has some constipation.  I have asked her to start taking stool softener. -We will do work-up for anemia including ferritin, iron panel, W38, folic acid, methylmalonic acid, copper levels.  We will also check serum protein electrophoresis.  Will check LDH and reticulocyte count. -We have talked to her about starting parenteral iron therapy if she does not get good response with oral iron therapy.  We talked about Feraheme weekly x2 and its side effects.  She has minor side effects from couple of other medications.  She does not require any premedication with steroids. -We will discuss further  plan next week.  3.  Weight loss: -She lost about 20-25 pounds since her surgery. -She is slowly gaining back.  Her appetite is improving.

## 2019-06-29 NOTE — Patient Instructions (Addendum)
[place] at [facility] Discharge Instructions  You were seen today by Dr. Delton Coombes. He went over your recent lab results. Dr. Delton Coombes recommends a stool softener daily for constipation.  We will repeat a CT scan before your next visit.  We will do labs today and let you know the results.  He will see you back in 4 months for labs and follow up.   Thank you for choosing [place] at [facility] to provide your oncology and hematology care.  To afford each patient quality time with our provider, please arrive at least 15 minutes before your scheduled appointment time.   If you have a lab appointment with the [facility] please come in thru the  Main Entrance and check in at the main information desk  You need to re-schedule your appointment should you arrive 10 or more minutes late.  We strive to give you quality time with our providers, and arriving late affects you and other patients whose appointments are after yours.  Also, if you no show three or more times for appointments you may be dismissed from the clinic at the providers discretion.     Again, thank you for choosing [facility].  Our hope is that these requests will decrease the amount of time that you wait before being seen by our physicians.       _____________________________________________________________  Should you have questions after your visit to [facility], please contact our office at [phone] between the hours of 8:00 a.m. and 4:30 p.m.  Voicemails left after 4:00 p.m. will not be returned until the following business day.  For prescription refill requests, have your pharmacy contact our office and allow 72 hours.    Cancer Center Support Programs:   > Cancer Support Group  2nd Tuesday of the month 1pm-2pm, Journey Room

## 2019-06-30 LAB — PROTEIN ELECTROPHORESIS, SERUM
A/G Ratio: 0.9 (ref 0.7–1.7)
Albumin ELP: 3.3 g/dL (ref 2.9–4.4)
Alpha-1-Globulin: 0.2 g/dL (ref 0.0–0.4)
Alpha-2-Globulin: 0.9 g/dL (ref 0.4–1.0)
Beta Globulin: 1.2 g/dL (ref 0.7–1.3)
Gamma Globulin: 1.2 g/dL (ref 0.4–1.8)
Globulin, Total: 3.6 g/dL (ref 2.2–3.9)
Total Protein ELP: 6.9 g/dL (ref 6.0–8.5)

## 2019-07-02 LAB — COPPER, SERUM: Copper: 153 ug/dL (ref 80–158)

## 2019-07-05 ENCOUNTER — Other Ambulatory Visit (HOSPITAL_COMMUNITY): Payer: Self-pay | Admitting: Hematology

## 2019-07-05 ENCOUNTER — Encounter (HOSPITAL_COMMUNITY): Payer: Self-pay | Admitting: *Deleted

## 2019-07-05 NOTE — Progress Notes (Signed)
Patient's daughter called and said that mom's energy level has not improved with the oral iron supplements. She has zero energy and no stamina at all. She hasn't been eating well either. She wants to know if she can come in for iron infusions that you discussed with them last week.   Per Dr. Delton Coombes, labs reviewed, iron infusions (feraheme) x 2 at least one week apart.  Patient's daughter is aware.  Our schedulers will call with the appointment tomorrow.

## 2019-07-07 ENCOUNTER — Other Ambulatory Visit: Payer: Self-pay

## 2019-07-07 ENCOUNTER — Inpatient Hospital Stay (HOSPITAL_COMMUNITY): Payer: Medicare Other

## 2019-07-07 ENCOUNTER — Encounter (HOSPITAL_COMMUNITY): Payer: Self-pay

## 2019-07-07 VITALS — BP 142/57 | HR 76 | Temp 97.8°F | Resp 18

## 2019-07-07 DIAGNOSIS — N189 Chronic kidney disease, unspecified: Secondary | ICD-10-CM | POA: Diagnosis not present

## 2019-07-07 DIAGNOSIS — C3411 Malignant neoplasm of upper lobe, right bronchus or lung: Secondary | ICD-10-CM | POA: Diagnosis not present

## 2019-07-07 DIAGNOSIS — K5900 Constipation, unspecified: Secondary | ICD-10-CM | POA: Diagnosis not present

## 2019-07-07 DIAGNOSIS — Z87891 Personal history of nicotine dependence: Secondary | ICD-10-CM | POA: Diagnosis not present

## 2019-07-07 DIAGNOSIS — Z902 Acquired absence of lung [part of]: Secondary | ICD-10-CM | POA: Diagnosis not present

## 2019-07-07 DIAGNOSIS — D649 Anemia, unspecified: Secondary | ICD-10-CM

## 2019-07-07 LAB — METHYLMALONIC ACID, SERUM: Methylmalonic Acid, Quantitative: 315 nmol/L (ref 0–378)

## 2019-07-07 MED ORDER — SODIUM CHLORIDE 0.9 % IV SOLN: Freq: Once | INTRAVENOUS | Status: AC

## 2019-07-07 MED ORDER — SODIUM CHLORIDE 0.9 % IV SOLN
510.0000 mg | Freq: Once | INTRAVENOUS | Status: AC
  Administered 2019-07-07: 510 mg via INTRAVENOUS
  Filled 2019-07-07: qty 510

## 2019-07-07 NOTE — Patient Instructions (Signed)
[facility] at [facility]  Discharge Instructions:   _______________________________________________________________  Thank you for choosing [facility] at [facility] to provide your oncology and hematology care.  To afford each patient quality time with our providers, please arrive at least 15 minutes before your scheduled appointment.  You need to re-schedule your appointment if you arrive 10 or more minutes late.  We strive to give you quality time with our providers, and arriving late affects you and other patients whose appointments are after yours.  Also, if you no show three or more times for appointments you may be dismissed from the clinic.  Again, thank you for choosing [facility] at [facility]. Our hope is that these requests will allow you access to exceptional care and in a timely manner. _______________________________________________________________  If you have questions after your visit, please contact our office at [phone] between the hours of 8:30 a.m. and 5:00 p.m. Voicemails left after 4:30 p.m. will not be returned until the following business day. _______________________________________________________________  For prescription refill requests, have your pharmacy contact our office. _______________________________________________________________  Recommendations made by the consultant and any test results will be sent to your referring physician. _______________________________________________________________ 

## 2019-07-07 NOTE — Progress Notes (Signed)
Patient presents today for Feraheme infusion. Patient has no complaints of any changes since her last visit. MAR reviewed and updated. Patient denies any pain today.   Feraheme given today per MD orders. Tolerated infusion without adverse affects. Vital signs stable. No complaints at this time. Discharged from clinic ambulatory. F/U with [facility] as scheduled.

## 2019-07-13 ENCOUNTER — Ambulatory Visit (INDEPENDENT_AMBULATORY_CARE_PROVIDER_SITE_OTHER): Payer: Self-pay | Admitting: Thoracic Surgery (Cardiothoracic Vascular Surgery)

## 2019-07-13 ENCOUNTER — Encounter: Payer: Self-pay | Admitting: Thoracic Surgery (Cardiothoracic Vascular Surgery)

## 2019-07-13 ENCOUNTER — Other Ambulatory Visit: Payer: Self-pay

## 2019-07-13 VITALS — BP 180/77 | HR 100 | Temp 97.7°F | Resp 20 | Ht 64.0 in | Wt 177.0 lb

## 2019-07-13 DIAGNOSIS — C3491 Malignant neoplasm of unspecified part of right bronchus or lung: Secondary | ICD-10-CM

## 2019-07-13 DIAGNOSIS — Z902 Acquired absence of lung [part of]: Secondary | ICD-10-CM

## 2019-07-13 NOTE — Progress Notes (Signed)
[address]       [place] [address]             [phone]     HPI: Karen Tate returns for a scheduled follow-up visit  Karen Tate is a 74 year old woman with a history of tobacco abuse, CAD, coronary bypass grafting, postoperative atrial fibrillation, DVT, PE, arthritis, reflux, hypertension, hyperlipidemia, type 2 diabetes, glaucoma, and anxiety.  She had a robotic right upper lobectomy on 05/13/2019.  She went home on day 3.  About a month postop she became short of breath.  She went to the ED at [facility] and was admitted and treated for dehydration and possible pneumonia.  I saw her in the office on 06/22/2019.  She was complaining of fatigue, general malaise, incisional pain and poor appetite.  In the interim since her last visit she saw Dr. Delton Coombes.  No adjuvant therapy was recommended.  She feels better.  She still is not back to normal.  Her incisional pain has improved significantly.  Her appetite is better but she still only eating about 1 full meal a day.  She is drinking a lot of water.   Past Medical History:  Diagnosis Date  . Anxiety neurosis   . Arthritis   . Asthma   . CAD (coronary artery disease)    a. 08/2017: abnormal nuc-> sent directly to [facility], NSTEMI, s/p CABG (left internal mammary artery to left anterior descending, sequential saphenous vein graft to ramus intermedius branches 1 and 2).  . Carotid artery disease ([place])   . Chronic anemia   . Dizziness   . GERD (gastroesophageal reflux disease)   . Glaucoma   . Heart disease   . Hiatal hernia   . Hyperlipemia   . Hypertension   . Ischemic cardiomyopathy   . Joint pain   . Mitral regurgitation   . Postoperative atrial fibrillation ([place]) 08/2017  . Pulmonary embolus ([place])   . Type 2 diabetes mellitus ([place])     Current Outpatient Medications  Medication Sig Dispense Refill  . acetaminophen (TYLENOL) 500 MG tablet Take 1,000 mg by mouth every 6 (six) hours as needed for  moderate pain.    Marland Kitchen ALPRAZolam (XANAX) 0.5 MG tablet Take 0.5 mg by mouth 2 (two) times daily.    Marland Kitchen amLODipine (NORVASC) 10 MG tablet Take 10 mg by mouth daily.    Marland Kitchen aspirin 81 MG tablet Take 1 tablet (81 mg total) by mouth daily.    Marland Kitchen atorvastatin (LIPITOR) 80 MG tablet Take 1 tablet (80 mg total) by mouth daily at 6 PM. 90 tablet 3  . carvedilol (COREG) 12.5 MG tablet Take 1 tablet (12.5 mg total) by mouth 2 (two) times daily with a meal. 60 tablet 1  . chlorthalidone (HYGROTON) 25 MG tablet Take 25 mg by mouth 2 (two) times daily.     . ferrous sulfate 325 (65 FE) MG tablet Take 325 mg by mouth 2 (two) times daily.    . fish oil-omega-3 fatty acids 1000 MG capsule Take 1 g by mouth 3 (three) times daily.     Marland Kitchen glipiZIDE (GLUCOTROL) 5 MG tablet Take 5 mg by mouth 2 (two) times daily before a meal.    . latanoprost (XALATAN) 0.005 % ophthalmic solution Place 1 drop into both eyes at bedtime.    . magnesium oxide (MAG-OX) 400 MG tablet Take 1 tablet (400 mg total) by mouth 2 (two) times daily. (Patient taking differently: Take 400  mg by mouth daily. ) 180 tablet 3  . meclizine (ANTIVERT) 25 MG tablet Take 25 mg by mouth 3 (three) times daily as needed for dizziness.    . metFORMIN (GLUCOPHAGE) 1000 MG tablet Take 1,000 mg by mouth 2 (two) times daily with a meal.    . Multiple Vitamins-Minerals (CENTRUM SILVER 50+WOMEN) TABS Take 1 tablet by mouth daily.     Marland Kitchen omeprazole (PRILOSEC) 40 MG capsule Take 40 mg by mouth daily.    Marland Kitchen oxyCODONE (OXY IR/ROXICODONE) 5 MG immediate release tablet Take 1 tablet (5 mg total) by mouth every 6 (six) hours as needed for moderate pain. 20 tablet 0  . tiZANidine (ZANAFLEX) 4 MG tablet Take 1 tablet (4 mg total) by mouth every 6 (six) hours as needed for muscle spasms. 60 tablet 0   No current facility-administered medications for this visit.    Physical Exam BP (!) 180/77 (BP Location: Left Arm, Patient Position: Sitting, Cuff Size: Large)   Pulse 100   Temp  97.7 F (36.5 C)   Resp 20   Ht 5\' 4"  (1.626 m)   Wt 177 lb (80.3 kg)   SpO2 98% Comment: RA  BMI 30.62 kg/m  74 year old woman in no acute distress Alert and oriented x3 with no focal deficits Lungs diminished at right base, otherwise clear Incisions well-healed Cardiac regular rate and rhythm  Diagnostic Tests: None  Impression: Karen Tate is a 74 year old woman with a history of tobacco abuse, CAD, coronary bypass grafting, postoperative atrial fibrillation, DVT, PE, arthritis, reflux, hypertension, hyperlipidemia, type 2 diabetes, glaucoma, and anxiety.  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  When I saw her a couple of weeks ago she was feeling very poorly.  She was having problems with fatigue, malaise, pain, and poor appetite.  She looks much better today.  Her appetite is still not back to normal but her pain is improved significantly, as have the fatigue and malaise.  I think at this point she has turned the corner and should be able to make good progress from here.  I will plan to see her back in a couple of months to check on her progress unless she needs to return sooner.  Plan: Return in 2 months with PA and lateral chest x-ray  [name], MD [facility] [phone]

## 2019-07-14 ENCOUNTER — Inpatient Hospital Stay (HOSPITAL_COMMUNITY): Payer: Medicare Other

## 2019-07-14 ENCOUNTER — Encounter (HOSPITAL_COMMUNITY): Payer: Self-pay

## 2019-07-14 VITALS — BP 143/59 | HR 85 | Temp 97.5°F | Resp 18

## 2019-07-14 DIAGNOSIS — D649 Anemia, unspecified: Secondary | ICD-10-CM | POA: Diagnosis not present

## 2019-07-14 DIAGNOSIS — Z87891 Personal history of nicotine dependence: Secondary | ICD-10-CM | POA: Diagnosis not present

## 2019-07-14 DIAGNOSIS — C3411 Malignant neoplasm of upper lobe, right bronchus or lung: Secondary | ICD-10-CM | POA: Diagnosis not present

## 2019-07-14 DIAGNOSIS — Z902 Acquired absence of lung [part of]: Secondary | ICD-10-CM | POA: Diagnosis not present

## 2019-07-14 DIAGNOSIS — N189 Chronic kidney disease, unspecified: Secondary | ICD-10-CM | POA: Diagnosis not present

## 2019-07-14 DIAGNOSIS — K5900 Constipation, unspecified: Secondary | ICD-10-CM | POA: Diagnosis not present

## 2019-07-14 MED ORDER — SODIUM CHLORIDE 0.9 % IV SOLN
510.0000 mg | Freq: Once | INTRAVENOUS | Status: AC
  Administered 2019-07-14: 510 mg via INTRAVENOUS
  Filled 2019-07-14: qty 510

## 2019-07-14 MED ORDER — SODIUM CHLORIDE 0.9 % IV SOLN: Freq: Once | INTRAVENOUS | Status: AC

## 2019-07-14 NOTE — Progress Notes (Signed)
Karen Tate tolerated Feraheme infusion well without complaints or incident.Peripheral IV site checked with positive blood return noted prior to and after infusion VSS upon discharge. Pt discharged via wheelchair in satisfactory condition accompanied by her daughter

## 2019-07-14 NOTE — Patient Instructions (Signed)
[facility] at [facility] Discharge Instructions  Received Feraheme infusion today. Follow-up as scheduled. Call clinic for any questions or concerns   Thank you for choosing [facility] at [facility] to provide your oncology and hematology care.  To afford each patient quality time with our provider, please arrive at least 15 minutes before your scheduled appointment time.   If you have a lab appointment with the Cancer Center please come in thru the Main Entrance and check in at the main information desk.  You need to re-schedule your appointment should you arrive 10 or more minutes late.  We strive to give you quality time with our providers, and arriving late affects you and other patients whose appointments are after yours.  Also, if you no show three or more times for appointments you may be dismissed from the clinic at the providers discretion.     Again, thank you for choosing [facility].  Our hope is that these requests will decrease the amount of time that you wait before being seen by our physicians.       _____________________________________________________________  Should you have questions after your visit to [facility], please contact our office at [phone] between the hours of 8:00 a.m. and 4:30 p.m.  Voicemails left after 4:00 p.m. will not be returned until the following business day.  For prescription refill requests, have your pharmacy contact our office and allow 72 hours.    Due to Covid, you will need to wear a mask upon entering the hospital. If you do not have a mask, a mask will be given to you at the Main Entrance upon arrival. For doctor visits, patients may have 1 support person with them. For treatment visits, patients can not have anyone with them due to social distancing guidelines and our immunocompromised population.     

## 2019-07-26 DIAGNOSIS — I10 Essential (primary) hypertension: Secondary | ICD-10-CM | POA: Diagnosis not present

## 2019-07-26 DIAGNOSIS — E1121 Type 2 diabetes mellitus with diabetic nephropathy: Secondary | ICD-10-CM | POA: Diagnosis not present

## 2019-08-10 ENCOUNTER — Other Ambulatory Visit: Payer: Self-pay

## 2019-08-10 ENCOUNTER — Ambulatory Visit (INDEPENDENT_AMBULATORY_CARE_PROVIDER_SITE_OTHER): Payer: Medicare Other | Admitting: Cardiovascular Disease

## 2019-08-10 ENCOUNTER — Encounter: Payer: Self-pay | Admitting: Cardiovascular Disease

## 2019-08-10 VITALS — BP 148/64 | HR 86 | Ht 65.0 in | Wt 180.0 lb

## 2019-08-10 DIAGNOSIS — I6523 Occlusion and stenosis of bilateral carotid arteries: Secondary | ICD-10-CM

## 2019-08-10 DIAGNOSIS — M545 Low back pain: Secondary | ICD-10-CM | POA: Diagnosis not present

## 2019-08-10 DIAGNOSIS — I25708 Atherosclerosis of coronary artery bypass graft(s), unspecified, with other forms of angina pectoris: Secondary | ICD-10-CM

## 2019-08-10 DIAGNOSIS — E0843 Diabetes mellitus due to underlying condition with diabetic autonomic (poly)neuropathy: Secondary | ICD-10-CM | POA: Diagnosis not present

## 2019-08-10 DIAGNOSIS — F411 Generalized anxiety disorder: Secondary | ICD-10-CM | POA: Diagnosis not present

## 2019-08-10 DIAGNOSIS — N182 Chronic kidney disease, stage 2 (mild): Secondary | ICD-10-CM | POA: Diagnosis not present

## 2019-08-10 DIAGNOSIS — I340 Nonrheumatic mitral (valve) insufficiency: Secondary | ICD-10-CM

## 2019-08-10 DIAGNOSIS — J1529 Pneumonia due to other staphylococcus: Secondary | ICD-10-CM | POA: Diagnosis not present

## 2019-08-10 DIAGNOSIS — I25119 Atherosclerotic heart disease of native coronary artery with unspecified angina pectoris: Secondary | ICD-10-CM | POA: Diagnosis not present

## 2019-08-10 DIAGNOSIS — I10 Essential (primary) hypertension: Secondary | ICD-10-CM

## 2019-08-10 DIAGNOSIS — K219 Gastro-esophageal reflux disease without esophagitis: Secondary | ICD-10-CM | POA: Diagnosis not present

## 2019-08-10 DIAGNOSIS — E785 Hyperlipidemia, unspecified: Secondary | ICD-10-CM

## 2019-08-10 DIAGNOSIS — E1121 Type 2 diabetes mellitus with diabetic nephropathy: Secondary | ICD-10-CM | POA: Diagnosis not present

## 2019-08-10 NOTE — Patient Instructions (Addendum)
Medication Instructions:  Continue all current medications.  Labwork: none  Testing/Procedures: none  Follow-Up: 6 months   Any Other Special Instructions Will Be Listed Below (If Applicable). Top BP number 140's or below.  If you need a refill on your cardiac medications before your next appointment, please call your pharmacy.'

## 2019-08-10 NOTE — Progress Notes (Signed)
SUBJECTIVE: The patient presents for routine follow-up.  She was hospitalized in February 2021 after she underwent robotic assisted thoracoscopy with right upper lobectomy with lymph node dissection.  She developed bradycardia.  Carvedilol dose was decreased and this resolved.  Past medical history includes three-vessel CABG on 09/18/2017 (left internal mammary artery to left anterior descending, sequential saphenous vein graft to ramus intermedius branches 1 and 2).   She tested positive for COVID-19 in November 2020.  She was subsequently hospitalized for pneumonia at [facility].  She is doing well now and only uses oxygen as needed.  She denies chest pain.  Chronic exertional dyspnea appears to be stable.  She denies leg swelling.    Review of Systems: As per "subjective", otherwise negative.  Allergies  Allergen Reactions  . Beta Adrenergic Blockers Other (See Comments)    Dropped heart rate to the 40's  . Calcium Channel Blockers Other (See Comments)    Dropped HR into the 40's  . Naproxen Hives    Current Outpatient Medications  Medication Sig Dispense Refill  . acetaminophen (TYLENOL) 500 MG tablet Take 1,000 mg by mouth every 6 (six) hours as needed for moderate pain.    Marland Kitchen ALPRAZolam (XANAX) 0.5 MG tablet Take 0.5 mg by mouth 2 (two) times daily.    Marland Kitchen amLODipine (NORVASC) 10 MG tablet Take 10 mg by mouth daily.    Marland Kitchen aspirin 81 MG tablet Take 1 tablet (81 mg total) by mouth daily.    Marland Kitchen atorvastatin (LIPITOR) 80 MG tablet Take 1 tablet (80 mg total) by mouth daily at 6 PM. 90 tablet 3  . carvedilol (COREG) 12.5 MG tablet Take 1 tablet (12.5 mg total) by mouth 2 (two) times daily with a meal. 60 tablet 1  . chlorthalidone (HYGROTON) 25 MG tablet Take 25 mg by mouth 2 (two) times daily.     . ferrous sulfate 325 (65 FE) MG tablet Take 325 mg by mouth 2 (two) times daily.    . fish oil-omega-3 fatty acids 1000 MG capsule Take 1 g by mouth 3 (three) times daily.     Marland Kitchen  glipiZIDE (GLUCOTROL) 5 MG tablet Take 5 mg by mouth 2 (two) times daily before a meal.    . latanoprost (XALATAN) 0.005 % ophthalmic solution Place 1 drop into both eyes at bedtime.    . magnesium oxide (MAG-OX) 400 MG tablet Take 1 tablet (400 mg total) by mouth 2 (two) times daily. (Patient taking differently: Take 400 mg by mouth daily. ) 180 tablet 3  . meclizine (ANTIVERT) 25 MG tablet Take 25 mg by mouth 3 (three) times daily as needed for dizziness.    . metFORMIN (GLUCOPHAGE) 1000 MG tablet Take 1,000 mg by mouth 2 (two) times daily with a meal.    . Multiple Vitamins-Minerals (CENTRUM SILVER 50+WOMEN) TABS Take 1 tablet by mouth daily.     Marland Kitchen omeprazole (PRILOSEC) 40 MG capsule Take 40 mg by mouth daily.    Marland Kitchen oxyCODONE (OXY IR/ROXICODONE) 5 MG immediate release tablet Take 1 tablet (5 mg total) by mouth every 6 (six) hours as needed for moderate pain. 20 tablet 0  . tiZANidine (ZANAFLEX) 4 MG tablet Take 1 tablet (4 mg total) by mouth every 6 (six) hours as needed for muscle spasms. 60 tablet 0   No current facility-administered medications for this visit.    Past Medical History:  Diagnosis Date  . Anxiety neurosis   . Arthritis   .  Asthma   . CAD (coronary artery disease)    a. 08/2017: abnormal nuc-> sent directly to [facility], NSTEMI, s/p CABG (left internal mammary artery to left anterior descending, sequential saphenous vein graft to ramus intermedius branches 1 and 2).  . Carotid artery disease ([place])   . Chronic anemia   . Dizziness   . GERD (gastroesophageal reflux disease)   . Glaucoma   . Heart disease   . Hiatal hernia   . Hyperlipemia   . Hypertension   . Ischemic cardiomyopathy   . Joint pain   . Mitral regurgitation   . Postoperative atrial fibrillation ([place]) 08/2017  . Pulmonary embolus ([place])   . Type 2 diabetes mellitus ([place])     Past Surgical History:  Procedure Laterality Date  . BACK SURGERY  2016  . CORONARY ARTERY BYPASS GRAFT N/A 09/18/2017    Procedure: CORONARY ARTERY BYPASS GRAFTING (CABG);  Surgeon: [name], MD;  Location: [place];  Service: Open Heart Surgery;  Laterality: N/A;  Saphenous vein harvest. LIMA to LAD Saphenous vein (sequential) ramus 1 & 2  . EYE SURGERY Bilateral    "surgery for glaucoma"  . HERNIA REPAIR    . INTERCOSTAL NERVE BLOCK Right 05/13/2019   Procedure: Intercostal Nerve Block;  Surgeon: [name], MD;  Location: [place];  Service: Thoracic;  Laterality: Right;  . LEFT HEART CATH AND CORONARY ANGIOGRAPHY N/A 09/15/2017   Procedure: LEFT HEART CATH AND CORONARY ANGIOGRAPHY;  Surgeon: Lorretta Harp, MD;  Location: [place] [facility];  Service: Cardiovascular;  Laterality: N/A;  . LYMPH NODE DISSECTION Right 05/13/2019   Procedure: Lymph Node Dissection;  Surgeon: [name], MD;  Location: [place];  Service: Thoracic;  Laterality: Right;  . ROTATOR CUFF REPAIR Left    x2  . SHOULDER SURGERY     LEFT  . TEE WITHOUT CARDIOVERSION N/A 09/18/2017   Procedure: TRANSESOPHAGEAL ECHOCARDIOGRAM (TEE);  Surgeon: [name], MD;  Location: [place];  Service: Open Heart Surgery;  Laterality: N/A;  . TOTAL KNEE ARTHROPLASTY     LEFT  . VAGINAL HYSTERECTOMY     partial    Social History   Socioeconomic History  . Marital status: Married    Spouse name: [name]  . Number of children: 5  . Years of education: Not on file  . Highest education level: Not on file  Occupational History  . Not on file  Tobacco Use  . Smoking status: Former Smoker    Packs/day: 1.25    Years: 16.00    Pack years: 20.00    Types: Cigarettes    Quit date: 03/25/1978    Years since quitting: 41.4  . Smokeless tobacco: Never Used  . Tobacco comment: quit more than 30 years ago  Substance and Sexual Activity  . Alcohol use: No  . Drug use: No  . Sexual activity: Not on file  Other Topics Concern  . Not on file  Social History Narrative  . Not on file   Social Determinants of Health     Financial Resource Strain: Low Risk   . Difficulty of Paying Living Expenses: Not hard at all  Food Insecurity: No Food Insecurity  . Worried About Charity fundraiser in the Last Year: Never true  . Ran Out of Food in the Last Year: Never true  Transportation Needs: No Transportation Needs  . Lack of Transportation (Medical): No  . Lack of Transportation (Non-Medical): No  Physical Activity: Inactive  .  Days of Exercise per Week: 0 days  . Minutes of Exercise per Session: 0 min  Stress: No Stress Concern Present  . Feeling of Stress : Not at all  Social Connections: Slightly Isolated  . Frequency of Communication with Friends and Family: More than three times a week  . Frequency of Social Gatherings with Friends and Family: More than three times a week  . Attends Religious Services: More than 4 times per year  . Active Member of Clubs or Organizations: No  . Attends Archivist Meetings: Never  . Marital Status: Married  Human resources officer Violence: Not At Risk  . Fear of Current or Ex-Partner: No  . Emotionally Abused: No  . Physically Abused: No  . Sexually Abused: No    Orson Slick, LPN was present throughout the entirety of the encounter.  Vitals:   08/10/19 1546  BP: (!) 164/60  Pulse: 86  SpO2: 98%  Weight: 180 lb (81.6 kg)  Height: 5\' 5"  (1.651 m)    Wt Readings from Last 3 Encounters:  08/10/19 180 lb (81.6 kg)  07/13/19 177 lb (80.3 kg)  06/29/19 177 lb 1.6 oz (80.3 kg)   Repeat blood pressure 148/64  PHYSICAL EXAM General: NAD HEENT: Normal. Neck: No JVD, no thyromegaly. Lungs: Diminished breath sounds on right as compared to left with no crackles or wheezes heard.   CV: Regular rate and rhythm, normal S1/S2, no S3/S4, no murmur. No pretibial or periankle edema.  No carotid bruit.   Abdomen: Soft, nontender, no distention.  Neurologic: Alert and oriented.  Psych: Normal affect. Skin: Normal. Musculoskeletal: No gross  deformities.      Labs: Lab Results  Component Value Date/Time   K 4.1 05/15/2019 10:09 AM   BUN 25 (H) 05/15/2019 10:09 AM   CREATININE 1.23 (H) 05/15/2019 10:09 AM   ALT 35 05/15/2019 10:09 AM   TSH 1.064 09/12/2017 02:50 PM   HGB 9.3 (L) 06/29/2019 11:14 AM     Lipids: Lab Results  Component Value Date/Time   LDLCALC 75 09/13/2017 04:23 AM   CHOL 152 09/13/2017 04:23 AM   TRIG 209 (H) 09/13/2017 04:23 AM   HDL 35 (L) 09/13/2017 04:23 AM      Echocardiogram 11/18/2018: 1. The left ventricle has normal systolic function with an ejection  fraction of 60-65%. The cavity size was normal. Left ventricular diastolic  parameters were normal.  2. The right ventricle has mildly reduced systolic function. The cavity  was normal. There is no increase in right ventricular wall thickness.  Right ventricular systolic pressure is moderately elevated with an  estimated pressure of 49.5 mmHg.  3. The aortic valve is tricuspid. Mild calcification of the aortic valve.  Mild to moderate aortic annular calcification noted.  4. The mitral valve is degenerative. Mild thickening of the mitral valve  leaflet. Mild calcification of the mitral valve leaflet. Mitral valve  regurgitation is moderate by color flow Doppler. The MR jet is eccentric  posteriorly directed.  5. The tricuspid valve is grossly normal.  6. The aorta is normal unless otherwise noted.    ASSESSMENT AND PLAN:  1. Coronary disease status post three-vessel CABG with non-STEMI: Symptomatically stable. Currently on aspirin, atorvastatin 80 mg, and carvedilol.  LV systolic function has normalized by echocardiogram in August 2020.  2. Hypertension:Blood pressure is elevated.  She had previously been on lisinopril.  It tends to fluctuate.  This will need continued monitoring.  Goal BP less than 130/80.  3. Hyperlipidemia:  Continue high intensity statin therapy with atorvastatin 80 mg.  4. Bilateral carotid  artery disease: 1 to 39% stenosis in the right ICA and 40 to 59% stenosis in the left ICA on 09/17/2017. Continue statin. I will monitor with surveillance Dopplers.  5.  Mitral regurgitation: Moderate in severity by echocardiogram in August 2020.  I will monitor.    Disposition: Follow up 6 months   Kate Sable, M.D., F.A.C.C.

## 2019-08-13 ENCOUNTER — Telehealth: Payer: Self-pay | Admitting: Cardiovascular Disease

## 2019-08-13 NOTE — Telephone Encounter (Signed)
Informed patient that she may take Tylenol Arthritis as needed.  Would avoid Ibuprofen as this can increase her risk for bleeding.  Suggested that she may also use heat / cold packs intermittently.  If needing something more than that, asked her to contact pcp.  She verbalized understanding.

## 2019-08-13 NOTE — Telephone Encounter (Signed)
Asking what she can take for pain to help her back

## 2019-08-30 DIAGNOSIS — M545 Low back pain: Secondary | ICD-10-CM | POA: Diagnosis not present

## 2019-08-30 DIAGNOSIS — M5134 Other intervertebral disc degeneration, thoracic region: Secondary | ICD-10-CM | POA: Diagnosis not present

## 2019-08-30 DIAGNOSIS — F411 Generalized anxiety disorder: Secondary | ICD-10-CM | POA: Diagnosis not present

## 2019-08-30 DIAGNOSIS — M5030 Other cervical disc degeneration, unspecified cervical region: Secondary | ICD-10-CM | POA: Diagnosis not present

## 2019-08-30 DIAGNOSIS — K219 Gastro-esophageal reflux disease without esophagitis: Secondary | ICD-10-CM | POA: Diagnosis not present

## 2019-08-30 DIAGNOSIS — E1121 Type 2 diabetes mellitus with diabetic nephropathy: Secondary | ICD-10-CM | POA: Diagnosis not present

## 2019-08-30 DIAGNOSIS — N182 Chronic kidney disease, stage 2 (mild): Secondary | ICD-10-CM | POA: Diagnosis not present

## 2019-09-01 ENCOUNTER — Telehealth: Payer: Self-pay | Admitting: Cardiovascular Disease

## 2019-09-01 NOTE — Telephone Encounter (Signed)
Patient called stating that her BP continues to stay elevated.

## 2019-09-02 NOTE — Telephone Encounter (Signed)
BP for the last few days since LOV has been 153/70 155/80 133/99 159/77 149/72 169/74 161/76 150/71 158/82 159/77 HR 70s-80s - will forward to covering provider if needs to restart lisinopril or another medication

## 2019-09-03 NOTE — Telephone Encounter (Signed)
Start lisinopril 2.5mg  daily, update [place] again in 1 week   Zandra Abts MD

## 2019-09-06 ENCOUNTER — Other Ambulatory Visit: Payer: Self-pay | Admitting: Thoracic Surgery (Cardiothoracic Vascular Surgery)

## 2019-09-06 DIAGNOSIS — Z951 Presence of aortocoronary bypass graft: Secondary | ICD-10-CM

## 2019-09-06 MED ORDER — LISINOPRIL 2.5 MG PO TABS: 2.5000 mg | ORAL_TABLET | Freq: Every day | ORAL | 1 refills | Status: DC

## 2019-09-06 NOTE — Telephone Encounter (Signed)
Pt voiced understanding and will update [place] on BP in 1 week - Medication sent to pharmacy.

## 2019-09-07 ENCOUNTER — Encounter: Payer: Self-pay | Admitting: Thoracic Surgery (Cardiothoracic Vascular Surgery)

## 2019-09-07 ENCOUNTER — Ambulatory Visit (INDEPENDENT_AMBULATORY_CARE_PROVIDER_SITE_OTHER): Payer: Medicare Other | Admitting: Thoracic Surgery (Cardiothoracic Vascular Surgery)

## 2019-09-07 ENCOUNTER — Other Ambulatory Visit: Payer: Self-pay

## 2019-09-07 ENCOUNTER — Ambulatory Visit
Admission: RE | Admit: 2019-09-07 | Discharge: 2019-09-07 | Disposition: A | Discharge status: Home / Self Care | Payer: Medicare Other | Source: Ambulatory Visit | Attending: Thoracic Surgery (Cardiothoracic Vascular Surgery) | Admitting: Thoracic Surgery (Cardiothoracic Vascular Surgery)

## 2019-09-07 VITALS — BP 163/88 | HR 86 | Temp 97.7°F | Resp 20 | Ht 64.0 in | Wt 176.0 lb

## 2019-09-07 DIAGNOSIS — Z951 Presence of aortocoronary bypass graft: Secondary | ICD-10-CM

## 2019-09-07 DIAGNOSIS — I6523 Occlusion and stenosis of bilateral carotid arteries: Secondary | ICD-10-CM

## 2019-09-07 DIAGNOSIS — C3491 Malignant neoplasm of unspecified part of right bronchus or lung: Secondary | ICD-10-CM | POA: Diagnosis not present

## 2019-09-07 DIAGNOSIS — J939 Pneumothorax, unspecified: Secondary | ICD-10-CM | POA: Diagnosis not present

## 2019-09-07 NOTE — Progress Notes (Signed)
[address]       [place] [address]             [phone]      HPI: Karen Tate returns for a scheduled follow-up visit  Karen Tate is a 74 year old woman with a history of tobacco abuse, coronary disease, CABG, postoperative atrial fibrillation, DVT, PE, arthritis, reflux, hypertension, hyperlipidemia, type 2 diabetes, glaucoma, and anxiety.  I did a robotic right upper lobectomy on 05/13/2019 for a stage Ia adenocarcinoma.  She had a rough time for about the first 4 to 6 weeks with failure to thrive.  She had a poor appetite, pain, malaise, and fatigue.  She actually got readmitted for dehydration.  I last saw her in April about 2 months postoperatively and she was still having problems with her appetite, but her pain was better.  She says that she has some sensitivity or stinging in the right chest.  She is not taking any pain medication for that.  Her biggest problem currently is back pain.  She has had some x-rays to look into that.  She is not having any respiratory issues.  Past Medical History:  Diagnosis Date  . Anxiety neurosis   . Arthritis   . Asthma   . CAD (coronary artery disease)    a. 08/2017: abnormal nuc-> sent directly to [facility], NSTEMI, s/p CABG (left internal mammary artery to left anterior descending, sequential saphenous vein graft to ramus intermedius branches 1 and 2).  . Carotid artery disease ([place])   . Chronic anemia   . Dizziness   . GERD (gastroesophageal reflux disease)   . Glaucoma   . Heart disease   . Hiatal hernia   . Hyperlipemia   . Hypertension   . Ischemic cardiomyopathy   . Joint pain   . Mitral regurgitation   . Postoperative atrial fibrillation ([place]) 08/2017  . Pulmonary embolus ([place])   . Type 2 diabetes mellitus ([place])     Current Outpatient Medications  Medication Sig Dispense Refill  . acetaminophen (TYLENOL) 500 MG tablet Take 1,000 mg by mouth every 6 (six) hours as needed for moderate pain.    Marland Kitchen  ALPRAZolam (XANAX) 0.5 MG tablet Take 0.5 mg by mouth 2 (two) times daily.    Marland Kitchen amLODipine (NORVASC) 10 MG tablet Take 10 mg by mouth daily.    Marland Kitchen aspirin 81 MG tablet Take 1 tablet (81 mg total) by mouth daily.    Marland Kitchen atorvastatin (LIPITOR) 80 MG tablet Take 1 tablet (80 mg total) by mouth daily at 6 PM. 90 tablet 3  . carvedilol (COREG) 12.5 MG tablet Take 1 tablet (12.5 mg total) by mouth 2 (two) times daily with a meal. 60 tablet 1  . chlorthalidone (HYGROTON) 25 MG tablet Take 25 mg by mouth 2 (two) times daily.     . ferrous sulfate 325 (65 FE) MG tablet Take 325 mg by mouth 2 (two) times daily.    . fish oil-omega-3 fatty acids 1000 MG capsule Take 1 g by mouth 3 (three) times daily.     Marland Kitchen glipiZIDE (GLUCOTROL) 5 MG tablet Take 5 mg by mouth 2 (two) times daily before a meal.    . latanoprost (XALATAN) 0.005 % ophthalmic solution Place 1 drop into both eyes at bedtime.    Marland Kitchen lisinopril (ZESTRIL) 2.5 MG tablet Take 1 tablet (2.5 mg total) by mouth daily. 90 tablet 1  . magnesium oxide (MAG-OX) 400 MG tablet Take 1  tablet (400 mg total) by mouth 2 (two) times daily. (Patient taking differently: Take 400 mg by mouth daily. ) 180 tablet 3  . meclizine (ANTIVERT) 25 MG tablet Take 25 mg by mouth 3 (three) times daily as needed for dizziness.    . metFORMIN (GLUCOPHAGE) 1000 MG tablet Take 1,000 mg by mouth 2 (two) times daily with a meal.    . Multiple Vitamins-Minerals (CENTRUM SILVER 50+WOMEN) TABS Take 1 tablet by mouth daily.     Marland Kitchen omeprazole (PRILOSEC) 40 MG capsule Take 40 mg by mouth daily.    Marland Kitchen oxyCODONE (OXY IR/ROXICODONE) 5 MG immediate release tablet Take 1 tablet (5 mg total) by mouth every 6 (six) hours as needed for moderate pain. 20 tablet 0  . tiZANidine (ZANAFLEX) 4 MG tablet Take 1 tablet (4 mg total) by mouth every 6 (six) hours as needed for muscle spasms. 60 tablet 0   No current facility-administered medications for this visit.    Physical Exam BP (!) 163/88 (BP Location:  Right Arm, Patient Position: Sitting, Cuff Size: Normal)   Pulse 86   Temp 97.7 F (36.5 C) (Temporal)   Resp 20   Ht 5\' 4"  (1.626 m)   Wt 176 lb (79.8 kg)   SpO2 94% Comment: RA  BMI 30.73 kg/m  74 year old woman in no acute distress Alert and oriented x3 with no focal deficits Lungs diminished at right base but otherwise clear Cardiac regular rate and rhythm  Diagnostic Tests: CHEST - 2 VIEW  COMPARISON:  06/22/2019 chest radiograph.  FINDINGS: Intact sternotomy wires. CABG clips overlie the mediastinum. Stable cardiomediastinal silhouette with normal heart size. No pneumothorax. No pleural effusion. Stable mild elevation of the right hemidiaphragm. No pulmonary edema. Stable minimal right basilar curvilinear scarring versus atelectasis.  IMPRESSION: Stable mild right hemidiaphragm elevation with minimal right basilar scarring versus atelectasis.   Electronically Signed   By: Ilona Sorrel M.D.   On: 09/07/2019 10:39 I personally reviewed the chest x-ray images and concur with the findings noted above  Impression: Karen Tate is a 74 year old woman with a history of tobacco abuse, coronary disease, CABG, postoperative atrial fibrillation, DVT, PE, arthritis, reflux, hypertension, hyperlipidemia, type 2 diabetes, glaucoma, and anxiety.  She had a robotic right upper lobectomy for stage Ia non-small cell carcinoma in February 2021.  She had a rough time initially, but is doing well from a surgical standpoint now.  Her appetite is improved if not all the way back to normal.  Her pain is improved dramatically although she does still have some sensitivity in the right chest.  She is not requiring any narcotics.  Her blood pressure was elevated today.  She says she checked it this morning and it was fine.  There is probably a component of whitecoat hypertension.  She is following up with Dr. Bronson Ing regarding that issue.  She saw Dr. Delton Coombes previously.  She is  scheduled to see him again in August and had a CT of the chest.  Plan: Return in 8 months for 1 year follow-up visit  [name], MD [facility] [phone]

## 2019-09-17 ENCOUNTER — Telehealth: Payer: Self-pay | Admitting: Cardiovascular Disease

## 2019-09-17 NOTE — Telephone Encounter (Signed)
New message    Pt c/o BP issue: STAT if pt c/o blurred vision, one-sided weakness or slurred speech  1. What are your last 5 BP readings? 6/15 141/75 6/16 141/74 6/17 136/73 6/18 144/74 6/19 142/69 6/21 134/70 6/23 122/71 6/24 143/78 6/26 133/62 2. Are you having any other symptoms (ex. Dizziness, headache, blurred vision, passed out)? No symptoms   Patient was told to call back and give the bp readings for a week of taking the new medication

## 2019-09-20 ENCOUNTER — Encounter: Payer: Self-pay | Admitting: *Deleted

## 2019-09-20 NOTE — Telephone Encounter (Signed)
BP better controlled on low dose lisinopril. Continue to monitor for now. Her BP's have a tendency to fluctuate so I will hold off on increasing dosage at this time.

## 2019-09-21 NOTE — Telephone Encounter (Signed)
This encounter was created in error - please disregard.

## 2019-09-21 NOTE — Telephone Encounter (Signed)
Pt voiced understanding

## 2019-10-25 DIAGNOSIS — E7849 Other hyperlipidemia: Secondary | ICD-10-CM | POA: Diagnosis not present

## 2019-10-25 DIAGNOSIS — E1143 Type 2 diabetes mellitus with diabetic autonomic (poly)neuropathy: Secondary | ICD-10-CM | POA: Diagnosis not present

## 2019-10-25 DIAGNOSIS — I10 Essential (primary) hypertension: Secondary | ICD-10-CM | POA: Diagnosis not present

## 2019-10-25 DIAGNOSIS — K219 Gastro-esophageal reflux disease without esophagitis: Secondary | ICD-10-CM | POA: Diagnosis not present

## 2019-10-27 ENCOUNTER — Inpatient Hospital Stay (HOSPITAL_COMMUNITY): Payer: Medicare Other | Attending: Hematology

## 2019-10-27 ENCOUNTER — Ambulatory Visit (HOSPITAL_COMMUNITY)
Admission: RE | Admit: 2019-10-27 | Discharge: 2019-10-27 | Disposition: A | Discharge status: Home / Self Care | Payer: Medicare Other | Source: Ambulatory Visit | Attending: Hematology | Admitting: Hematology

## 2019-10-27 ENCOUNTER — Other Ambulatory Visit: Payer: Self-pay

## 2019-10-27 DIAGNOSIS — C3411 Malignant neoplasm of upper lobe, right bronchus or lung: Secondary | ICD-10-CM | POA: Diagnosis not present

## 2019-10-27 DIAGNOSIS — Z8052 Family history of malignant neoplasm of bladder: Secondary | ICD-10-CM | POA: Insufficient documentation

## 2019-10-27 DIAGNOSIS — I2510 Atherosclerotic heart disease of native coronary artery without angina pectoris: Secondary | ICD-10-CM | POA: Insufficient documentation

## 2019-10-27 DIAGNOSIS — C3491 Malignant neoplasm of unspecified part of right bronchus or lung: Secondary | ICD-10-CM

## 2019-10-27 DIAGNOSIS — Z87891 Personal history of nicotine dependence: Secondary | ICD-10-CM | POA: Diagnosis not present

## 2019-10-27 DIAGNOSIS — Z803 Family history of malignant neoplasm of breast: Secondary | ICD-10-CM | POA: Insufficient documentation

## 2019-10-27 DIAGNOSIS — D649 Anemia, unspecified: Secondary | ICD-10-CM | POA: Diagnosis not present

## 2019-10-27 DIAGNOSIS — I700 Atherosclerosis of aorta: Secondary | ICD-10-CM | POA: Diagnosis not present

## 2019-10-27 DIAGNOSIS — J45909 Unspecified asthma, uncomplicated: Secondary | ICD-10-CM | POA: Insufficient documentation

## 2019-10-27 DIAGNOSIS — I252 Old myocardial infarction: Secondary | ICD-10-CM | POA: Diagnosis not present

## 2019-10-27 DIAGNOSIS — E119 Type 2 diabetes mellitus without complications: Secondary | ICD-10-CM | POA: Insufficient documentation

## 2019-10-27 DIAGNOSIS — E785 Hyperlipidemia, unspecified: Secondary | ICD-10-CM | POA: Diagnosis not present

## 2019-10-27 DIAGNOSIS — Z833 Family history of diabetes mellitus: Secondary | ICD-10-CM | POA: Diagnosis not present

## 2019-10-27 DIAGNOSIS — Z7984 Long term (current) use of oral hypoglycemic drugs: Secondary | ICD-10-CM | POA: Insufficient documentation

## 2019-10-27 DIAGNOSIS — Z7982 Long term (current) use of aspirin: Secondary | ICD-10-CM | POA: Diagnosis not present

## 2019-10-27 DIAGNOSIS — E538 Deficiency of other specified B group vitamins: Secondary | ICD-10-CM | POA: Diagnosis not present

## 2019-10-27 DIAGNOSIS — Z79899 Other long term (current) drug therapy: Secondary | ICD-10-CM | POA: Diagnosis not present

## 2019-10-27 DIAGNOSIS — I4891 Unspecified atrial fibrillation: Secondary | ICD-10-CM | POA: Insufficient documentation

## 2019-10-27 DIAGNOSIS — Z8249 Family history of ischemic heart disease and other diseases of the circulatory system: Secondary | ICD-10-CM | POA: Diagnosis not present

## 2019-10-27 DIAGNOSIS — Z86711 Personal history of pulmonary embolism: Secondary | ICD-10-CM | POA: Diagnosis not present

## 2019-10-27 DIAGNOSIS — I272 Pulmonary hypertension, unspecified: Secondary | ICD-10-CM | POA: Diagnosis not present

## 2019-10-27 DIAGNOSIS — J439 Emphysema, unspecified: Secondary | ICD-10-CM | POA: Diagnosis not present

## 2019-10-27 LAB — CBC WITH DIFFERENTIAL/PLATELET
Abs Immature Granulocytes: 0.01 10*3/uL (ref 0.00–0.07)
Basophils Absolute: 0 10*3/uL (ref 0.0–0.1)
Basophils Relative: 1 %
Eosinophils Absolute: 0.1 10*3/uL (ref 0.0–0.5)
Eosinophils Relative: 2 %
HCT: 36.9 % (ref 36.0–46.0)
Hemoglobin: 11.9 g/dL — ABNORMAL LOW (ref 12.0–15.0)
Immature Granulocytes: 0 %
Lymphocytes Relative: 39 %
Lymphs Abs: 1.9 10*3/uL (ref 0.7–4.0)
MCH: 30.8 pg (ref 26.0–34.0)
MCHC: 32.2 g/dL (ref 30.0–36.0)
MCV: 95.6 fL (ref 80.0–100.0)
Monocytes Absolute: 0.4 10*3/uL (ref 0.1–1.0)
Monocytes Relative: 9 %
Neutro Abs: 2.4 10*3/uL (ref 1.7–7.7)
Neutrophils Relative %: 49 %
Platelets: 294 10*3/uL (ref 150–400)
RBC: 3.86 MIL/uL — ABNORMAL LOW (ref 3.87–5.11)
RDW: 15.3 % (ref 11.5–15.5)
WBC: 4.9 10*3/uL (ref 4.0–10.5)
nRBC: 0 % (ref 0.0–0.2)

## 2019-10-27 LAB — IRON AND TIBC
Iron: 59 ug/dL (ref 28–170)
Saturation Ratios: 19 % (ref 10.4–31.8)
TIBC: 316 ug/dL (ref 250–450)
UIBC: 257 ug/dL

## 2019-10-27 LAB — FERRITIN: Ferritin: 88 ng/mL (ref 11–307)

## 2019-10-27 LAB — VITAMIN B12: Vitamin B-12: 122 pg/mL — ABNORMAL LOW (ref 180–914)

## 2019-10-27 LAB — FOLATE: Folate: 12.7 ng/mL (ref >5.9)

## 2019-10-27 LAB — POCT I-STAT CREATININE: Creatinine, Ser: 1 mg/dL (ref 0.44–1.00)

## 2019-10-27 MED ORDER — IOHEXOL 300 MG/ML  SOLN
75.0000 mL | Freq: Once | INTRAMUSCULAR | Status: AC | PRN
  Administered 2019-10-27: 75 mL via INTRAVENOUS

## 2019-11-01 ENCOUNTER — Inpatient Hospital Stay (HOSPITAL_BASED_OUTPATIENT_CLINIC_OR_DEPARTMENT_OTHER): Payer: Medicare Other | Admitting: Hematology

## 2019-11-01 ENCOUNTER — Other Ambulatory Visit: Payer: Self-pay

## 2019-11-01 VITALS — BP 156/62 | HR 77 | Temp 96.9°F | Resp 18 | Wt 178.4 lb

## 2019-11-01 DIAGNOSIS — C3491 Malignant neoplasm of unspecified part of right bronchus or lung: Secondary | ICD-10-CM | POA: Diagnosis not present

## 2019-11-01 DIAGNOSIS — Z87891 Personal history of nicotine dependence: Secondary | ICD-10-CM | POA: Diagnosis not present

## 2019-11-01 DIAGNOSIS — C3411 Malignant neoplasm of upper lobe, right bronchus or lung: Secondary | ICD-10-CM | POA: Diagnosis not present

## 2019-11-01 DIAGNOSIS — E785 Hyperlipidemia, unspecified: Secondary | ICD-10-CM | POA: Diagnosis not present

## 2019-11-01 DIAGNOSIS — Z86711 Personal history of pulmonary embolism: Secondary | ICD-10-CM | POA: Diagnosis not present

## 2019-11-01 DIAGNOSIS — D649 Anemia, unspecified: Secondary | ICD-10-CM

## 2019-11-01 DIAGNOSIS — E538 Deficiency of other specified B group vitamins: Secondary | ICD-10-CM | POA: Diagnosis not present

## 2019-11-01 NOTE — Progress Notes (Signed)
[place] [place], [place] [address]   CLINIC:  Medical Oncology/Hematology  PCP:  Neale Burly, MD [place] [facility] [place] [address] [phone]   REASON FOR VISIT:  Follow-up for stage I right lung adenocarcinoma  PRIOR THERAPY: Right upper lobectomy on 05/13/2019  NGS Results: Not done  CURRENT THERAPY: Observation  BRIEF ONCOLOGIC HISTORY:  Oncology History  Non-small cell cancer of right lung ([place])  06/29/2019 Initial Diagnosis   Non-small cell cancer of right lung ([place])   06/29/2019 Cancer Staging   Staging form: Lung, AJCC 8th Edition - Clinical stage from 06/29/2019: Stage IA2 (cT1b, cN0, cM0) - Signed by Derek Jack, MD on 06/29/2019     CANCER STAGING: Cancer Staging Non-small cell cancer of right lung [facility]) Staging form: Lung, AJCC 8th Edition - Clinical stage from 06/29/2019: Stage IA2 (cT1b, cN0, cM0) - Signed by Derek Jack, MD on 06/29/2019   INTERVAL HISTORY:  [name], a 74 y.o. female, returns for routine follow-up of her stage I right lung adenocarcinoma. Karen Tate was last seen on 06/29/2019.  Today she is accompanied by her son. She denies having any new cough.   REVIEW OF SYSTEMS:  Review of Systems  Constitutional: Positive for appetite change (severely decreased) and fatigue (moderate).  Respiratory: Negative for cough.   All other systems reviewed and are negative.   PAST MEDICAL/SURGICAL HISTORY:  Past Medical History:  Diagnosis Date  . Anxiety neurosis   . Arthritis   . Asthma   . CAD (coronary artery disease)    a. 08/2017: abnormal nuc-> sent directly to [facility], NSTEMI, s/p CABG (left internal mammary artery to left anterior descending, sequential saphenous vein graft to ramus intermedius branches 1 and 2).  . Carotid artery disease ([place])   . Chronic anemia   . Dizziness   . GERD (gastroesophageal reflux disease)   . Glaucoma   . Heart disease   . Hiatal hernia   .  Hyperlipemia   . Hypertension   . Ischemic cardiomyopathy   . Joint pain   . Mitral regurgitation   . Postoperative atrial fibrillation ([place]) 08/2017  . Pulmonary embolus ([place])   . Type 2 diabetes mellitus ([place])    Past Surgical History:  Procedure Laterality Date  . BACK SURGERY  2016  . CORONARY ARTERY BYPASS GRAFT N/A 09/18/2017   Procedure: CORONARY ARTERY BYPASS GRAFTING (CABG);  Surgeon: [name], MD;  Location: [facility];  Service: Open Heart Surgery;  Laterality: N/A;  Saphenous vein harvest. LIMA to LAD Saphenous vein (sequential) ramus 1 & 2  . EYE SURGERY Bilateral    "surgery for glaucoma"  . HERNIA REPAIR    . INTERCOSTAL NERVE BLOCK Right 05/13/2019   Procedure: Intercostal Nerve Block;  Surgeon: [name], MD;  Location: [facility];  Service: Thoracic;  Laterality: Right;  . LEFT HEART CATH AND CORONARY ANGIOGRAPHY N/A 09/15/2017   Procedure: LEFT HEART CATH AND CORONARY ANGIOGRAPHY;  Surgeon: Lorretta Harp, MD;  Location: [facility];  Service: Cardiovascular;  Laterality: N/A;  . LYMPH NODE DISSECTION Right 05/13/2019   Procedure: Lymph Node Dissection;  Surgeon: [name], MD;  Location: [facility];  Service: Thoracic;  Laterality: Right;  . ROTATOR CUFF REPAIR Left    x2  . SHOULDER SURGERY     LEFT  . TEE WITHOUT CARDIOVERSION N/A 09/18/2017   Procedure: TRANSESOPHAGEAL ECHOCARDIOGRAM (TEE);  Surgeon: [name], MD;  Location: [facility];  Service: Open Heart Surgery;  Laterality: N/A;  . TOTAL KNEE ARTHROPLASTY     LEFT  . VAGINAL HYSTERECTOMY     partial    SOCIAL HISTORY:  Social History   Socioeconomic History  . Marital status: Married    Spouse name: Karen Tate  . Number of children: 5  . Years of education: Not on file  . Highest education level: Not on file  Occupational History  . Not on file  Tobacco Use  . Smoking status: Former Smoker    Packs/day: 1.25    Years: 16.00    Pack years: 20.00    Types:  Cigarettes    Quit date: 03/25/1978    Years since quitting: 41.6  . Smokeless tobacco: Never Used  . Tobacco comment: quit more than 30 years ago  Vaping Use  . Vaping Use: Never used  Substance and Sexual Activity  . Alcohol use: No  . Drug use: No  . Sexual activity: Not on file  Other Topics Concern  . Not on file  Social History Narrative  . Not on file   Social Determinants of Health   Financial Resource Strain: Low Risk   . Difficulty of Paying Living Expenses: Not hard at all  Food Insecurity: No Food Insecurity  . Worried About Charity fundraiser in the Last Year: Never true  . Ran Out of Food in the Last Year: Never true  Transportation Needs: No Transportation Needs  . Lack of Transportation (Medical): No  . Lack of Transportation (Non-Medical): No  Physical Activity: Inactive  . Days of Exercise per Week: 0 days  . Minutes of Exercise per Session: 0 min  Stress: No Stress Concern Present  . Feeling of Stress : Not at all  Social Connections: Moderately Integrated  . Frequency of Communication with Friends and Family: More than three times a week  . Frequency of Social Gatherings with Friends and Family: More than three times a week  . Attends Religious Services: More than 4 times per year  . Active Member of Clubs or Organizations: No  . Attends Archivist Meetings: Never  . Marital Status: Married  Human resources officer Violence: Not At Risk  . Fear of Current or Ex-Partner: No  . Emotionally Abused: No  . Physically Abused: No  . Sexually Abused: No    FAMILY HISTORY:  Family History  Problem Relation Age of Onset  . Diabetes Father   . Heart disease Father   . Breast cancer Mother   . Congestive Heart Failure Brother   . Anemia Daughter   . Seizures Daughter   . Migraines Daughter   . Diabetes Daughter   . Hypertension Daughter   . Bladder Cancer Son   . Congestive Heart Failure Son   . Congestive Heart Failure Son     CURRENT  MEDICATIONS:  Current Outpatient Medications  Medication Sig Dispense Refill  . ALPRAZolam (XANAX) 0.5 MG tablet Take 0.5 mg by mouth 2 (two) times daily.    Marland Kitchen amLODipine (NORVASC) 10 MG tablet Take 10 mg by mouth daily.    Marland Kitchen aspirin 81 MG tablet Take 1 tablet (81 mg total) by mouth daily.    Marland Kitchen atorvastatin (LIPITOR) 80 MG tablet Take 1 tablet (80 mg total) by mouth daily at 6 PM. 90 tablet 3  . carvedilol (COREG) 12.5 MG tablet Take 1 tablet (12.5 mg total) by mouth 2 (two) times daily with a meal. 60 tablet 1  .  chlorthalidone (HYGROTON) 25 MG tablet Take 25 mg by mouth 2 (two) times daily.     . ferrous sulfate 325 (65 FE) MG tablet Take 325 mg by mouth 2 (two) times daily.    . fish oil-omega-3 fatty acids 1000 MG capsule Take 1 g by mouth 3 (three) times daily.     Marland Kitchen glipiZIDE (GLUCOTROL) 5 MG tablet Take 5 mg by mouth 2 (two) times daily before a meal.    . latanoprost (XALATAN) 0.005 % ophthalmic solution Place 1 drop into both eyes at bedtime.    Marland Kitchen lisinopril (ZESTRIL) 2.5 MG tablet Take 1 tablet (2.5 mg total) by mouth daily. 90 tablet 1  . magnesium oxide (MAG-OX) 400 MG tablet Take 1 tablet (400 mg total) by mouth 2 (two) times daily. (Patient taking differently: Take 400 mg by mouth daily. ) 180 tablet 3  . metFORMIN (GLUCOPHAGE) 1000 MG tablet Take 1,000 mg by mouth 2 (two) times daily with a meal.    . Multiple Vitamins-Minerals (CENTRUM SILVER 50+WOMEN) TABS Take 1 tablet by mouth daily.     Marland Kitchen omeprazole (PRILOSEC) 40 MG capsule Take 40 mg by mouth daily.    Marland Kitchen acetaminophen (TYLENOL) 500 MG tablet Take 1,000 mg by mouth every 6 (six) hours as needed for moderate pain. (Patient not taking: Reported on 11/01/2019)    . meclizine (ANTIVERT) 25 MG tablet Take 25 mg by mouth 3 (three) times daily as needed for dizziness. (Patient not taking: Reported on 11/01/2019)    . oxyCODONE (OXY IR/ROXICODONE) 5 MG immediate release tablet Take 1 tablet (5 mg total) by mouth every 6 (six) hours as  needed for moderate pain. (Patient not taking: Reported on 11/01/2019) 20 tablet 0  . tiZANidine (ZANAFLEX) 4 MG tablet Take 1 tablet (4 mg total) by mouth every 6 (six) hours as needed for muscle spasms. (Patient not taking: Reported on 11/01/2019) 60 tablet 0   No current facility-administered medications for this visit.    ALLERGIES:  Allergies  Allergen Reactions  . Beta Adrenergic Blockers Other (See Comments)    Dropped heart rate to the 40's  . Calcium Channel Blockers Other (See Comments)    Dropped HR into the 40's  . Naproxen Hives    PHYSICAL EXAM:  Performance status (ECOG): 1 - Symptomatic but completely ambulatory  Vitals:   11/01/19 1107  BP: (!) 156/62  Pulse: 77  Resp: 18  Temp: (!) 96.9 F (36.1 C)  SpO2: 100%   Wt Readings from Last 3 Encounters:  11/01/19 178 lb 6.4 oz (80.9 kg)  09/07/19 176 lb (79.8 kg)  08/10/19 180 lb (81.6 kg)   Physical Exam Vitals reviewed.  Constitutional:      Appearance: Normal appearance. She is obese.  Cardiovascular:     Rate and Rhythm: Normal rate and regular rhythm.     Pulses: Normal pulses.     Heart sounds: Normal heart sounds.  Pulmonary:     Effort: Pulmonary effort is normal.     Breath sounds: Normal breath sounds.  Abdominal:     Palpations: Abdomen is soft.     Tenderness: There is no abdominal tenderness.  Neurological:     General: No focal deficit present.     Mental Status: She is alert and oriented to person, place, and time.  Psychiatric:        Mood and Affect: Mood normal.        Behavior: Behavior normal.      LABORATORY DATA:  I have reviewed the labs as listed.  CBC Latest Ref Rng & Units 10/27/2019 06/29/2019 05/15/2019  WBC 4.0 - 10.5 K/uL 4.9 5.4 10.9(H)  Hemoglobin 12.0 - 15.0 g/dL 11.9(L) 9.3(L) 7.6(L)  Hematocrit 36 - 46 % 36.9 32.4(L) 24.2(L)  Platelets 150 - 400 K/uL 294 567(H) 263   CMP Latest Ref Rng & Units 10/27/2019 05/15/2019 05/14/2019  Glucose 70 - 99 mg/dL - 276(H) -  BUN 8 -  23 mg/dL - 25(H) -  Creatinine 0.44 - 1.00 mg/dL 1.00 1.23(H) -  Sodium 135 - 145 mmol/L - 136 139  Potassium 3.5 - 5.1 mmol/L - 4.1 4.7  Chloride 98 - 111 mmol/L - 101 -  CO2 22 - 32 mmol/L - 24 -  Calcium 8.9 - 10.3 mg/dL - 8.8(L) -  Total Protein 6.5 - 8.1 g/dL - 5.7(L) -  Total Bilirubin 0.3 - 1.2 mg/dL - 0.9 -  Alkaline Phos 38 - 126 U/L - 37(L) -  AST 15 - 41 U/L - 42(H) -  ALT 0 - 44 U/L - 35 -   Lab Results  Component Value Date   TIBC 316 10/27/2019   TIBC 411 06/29/2019   FERRITIN 88 10/27/2019   FERRITIN 10 (L) 06/29/2019   IRONPCTSAT 19 10/27/2019   IRONPCTSAT 7 (L) 06/29/2019    DIAGNOSTIC IMAGING:  I have independently reviewed the scans and discussed with the patient. CT Chest W Contrast  Result Date: 10/27/2019 CLINICAL DATA:  History of right upper lobe lung cancer status post right upper lobe lobectomy 05/13/2019. No chemotherapy or radiation therapy. EXAM: CT CHEST WITH CONTRAST TECHNIQUE: Multidetector CT imaging of the chest was performed during intravenous contrast administration. CONTRAST:  66mL OMNIPAQUE IOHEXOL 300 MG/ML  SOLN COMPARISON:  Chest CT 04/12/2019 FINDINGS: Cardiovascular: The heart is normal in size. No pericardial effusion. Stable advanced atherosclerotic calcifications involving the aorta but no focal aneurysm or dissection. Calcifications along the aortic valve are also noted. Extensive three-vessel coronary artery calcifications. The pulmonary arteries are unremarkable. Mild pulmonary artery enlargement could suggest pulmonary hypertension. Surgical changes from coronary artery bypass surgery are noted. Mediastinum/Nodes: Small scattered mediastinal and hilar lymph nodes but no mass or overt adenopathy. The esophagus is unremarkable. Lungs/Pleura: Surgical changes from a right upper lobe lobectomy. 5 mm nodule noted in the right lower lobe on image 43/4. I do not see this for certain on the prior study. 2.5 mm subpleural nodule in the right lower  lobe on image 70/4 no left-sided pulmonary nodules are identified. No pleural effusions or pleural nodules. Upper Abdomen: No significant upper abdominal findings. Advanced aortic and branch vessel calcifications again noted. No findings for hepatic or adrenal gland metastasis. Musculoskeletal: No breast masses, supraclavicular or axillary adenopathy. Small left thyroid nodules none measuring more than 9 mm. Not clinically significant; no follow-up imaging recommended (ref: J Am Coll Radiol. 2015 Feb;12(2): 143-50).No significant bony findings. IMPRESSION: 1. Surgical changes from a right upper lobe lobectomy. No findings for recurrent tumor or mediastinal/hilar metastatic disease. 2. Two small indeterminate right lower lobe pulmonary nodules. Recommend attention on future scans. 3. Stable advanced atherosclerotic calcifications involving the thoracic and abdominal aorta and branch vessels including the coronary arteries. 4. Emphysema and aortic atherosclerosis. Aortic Atherosclerosis (ICD10-I70.0) and Emphysema (ICD10-J43.9). Electronically Signed   By: Marijo Sanes M.D.   On: 10/27/2019 13:58     ASSESSMENT:  1.  Stage I (PT 1 BPN 0) right upper lobe adenocarcinoma: -CT chest without contrast for follow-up of lung nodule  showed 13 x 10 mm spiculated mass in the right upper lobe, significantly enlarged compared to prior exam from October 2020. -PET CT scan on 04/26/2019 showed mildly hypermetabolic 1.1 cm right upper lobe pulmonary nodule with SUV 2.4.  No findings of adenopathy or metastatic disease. -Right upper lobectomy on 05/13/2019 shows 1.3 cm invasive adenocarcinoma, negative for visceral pleural invasion, LVI negative, margins negative.  Lymph nodes from stations 7, 11 R, 10 R, 4R, 12R are negative. -CT chest surveillance every 6 months for 2 to 3 years was recommended followed by noncontrast CT annually. -CT chest on 10/27/2019 showed surgical changes in the right upper lobe lobectomy with no  findings of recurrence.  2 small indeterminate right lower lobe pulmonary nodules, follow-up recommended.  2.  Normocytic anemia: -Her previous hemoglobin was 7.6 with MCV of 88. -She has mild degree of chronic kidney disease which is likely contributing to her anemia. -Colonoscopy earlier this year was reportedly normal. -Feraheme on 07/07/2019 and 07/14/2019.  3.  Weight loss: -She lost about 20-25 pounds since her surgery. -She is slowly gaining back.  Her appetite is improving.   PLAN:  1.  Stage I (PT 1 BPN 0) right upper lobe adenocarcinoma: -We reviewed results of CT of the chest from 10/27/2019. -I plan to repeat CT scan in 6 months.  2.  Normocytic anemia: -She felt better after IV iron.  Labs from 10/27/2019 show ferritin of 88 and percent saturation of 19. -Hemoglobin was 11.9.  3.  Vitamin B12 deficiency: -B12 level was 122.  She will start taking B12 tablet daily.    Orders placed this encounter:  No orders of the defined types were placed in this encounter.    Derek Jack, MD [facility] [phone]   I, Milinda Antis, am acting as a scribe for Dr. Sanda Linger.  I, Derek Jack MD, have reviewed the above documentation for accuracy and completeness, and I agree with the above.

## 2019-11-01 NOTE — Patient Instructions (Signed)
[facility] at [facility] Discharge Instructions  You were seen today by Dr. Katragadda. He went over your recent results and scans. Dr. Katragadda will see you back in 3 months for labs and follow up.   Thank you for choosing [facility] at [facility] to provide your oncology and hematology care.  To afford each patient quality time with our provider, please arrive at least 15 minutes before your scheduled appointment time.   If you have a lab appointment with the Cancer Center please come in thru the Main Entrance and check in at the main information desk  You need to re-schedule your appointment should you arrive 10 or more minutes late.  We strive to give you quality time with our providers, and arriving late affects you and other patients whose appointments are after yours.  Also, if you no show three or more times for appointments you may be dismissed from the clinic at the providers discretion.     Again, thank you for choosing [facility].  Our hope is that these requests will decrease the amount of time that you wait before being seen by our physicians.       _____________________________________________________________  Should you have questions after your visit to [facility], please contact our office at [phone] between the hours of 8:00 a.m. and 4:30 p.m.  Voicemails left after 4:00 p.m. will not be returned until the following business day.  For prescription refill requests, have your pharmacy contact our office and allow 72 hours.    Cancer Center Support Programs:   > Cancer Support Group  2nd Tuesday of the month 1pm-2pm, Journey Room    

## 2019-11-02 ENCOUNTER — Encounter (HOSPITAL_COMMUNITY): Payer: Self-pay

## 2019-11-02 NOTE — Progress Notes (Signed)
Per Dr. Delton Coombes, I called patient and asked that she begin taking 1 tablet of Vitamin B12 daily. I left a detailed voicemail and asked that the patient call me back with any questions.

## 2019-11-16 DIAGNOSIS — I5032 Chronic diastolic (congestive) heart failure: Secondary | ICD-10-CM | POA: Diagnosis not present

## 2019-11-24 ENCOUNTER — Encounter (HOSPITAL_COMMUNITY): Payer: Self-pay

## 2019-11-24 NOTE — Progress Notes (Signed)
Patient called the clinic reporting flank and abdominal pain for one week. Dr. Delton Coombes aware and advises patient call PCP. Patient aware and has already done so.

## 2019-12-01 DIAGNOSIS — I10 Essential (primary) hypertension: Secondary | ICD-10-CM | POA: Diagnosis not present

## 2019-12-01 DIAGNOSIS — E1121 Type 2 diabetes mellitus with diabetic nephropathy: Secondary | ICD-10-CM | POA: Diagnosis not present

## 2019-12-10 ENCOUNTER — Telehealth: Payer: Self-pay | Admitting: Cardiology

## 2019-12-10 ENCOUNTER — Encounter: Payer: Self-pay | Admitting: *Deleted

## 2019-12-10 NOTE — Telephone Encounter (Signed)
Noted Will request records from PCP

## 2019-12-10 NOTE — Telephone Encounter (Signed)
Per pt phone call-- BP is running high and her PCP changed her lisinopril (ZESTRIL) 2.5 MG tablet [[id number]] ENDED To 10mg  -- pt wanted to make Dr. Harl Bowie aware

## 2019-12-14 NOTE — Telephone Encounter (Signed)
That change is fine   J Arvo Ealy MD

## 2019-12-27 DIAGNOSIS — N760 Acute vaginitis: Secondary | ICD-10-CM | POA: Diagnosis not present

## 2020-01-06 ENCOUNTER — Ambulatory Visit: Payer: Medicare Other | Admitting: Family Medicine

## 2020-01-20 DIAGNOSIS — E1121 Type 2 diabetes mellitus with diabetic nephropathy: Secondary | ICD-10-CM | POA: Diagnosis not present

## 2020-01-20 DIAGNOSIS — I10 Essential (primary) hypertension: Secondary | ICD-10-CM | POA: Diagnosis not present

## 2020-01-25 ENCOUNTER — Other Ambulatory Visit: Payer: Self-pay

## 2020-01-25 ENCOUNTER — Inpatient Hospital Stay (HOSPITAL_COMMUNITY): Payer: Medicare Other | Attending: Hematology

## 2020-01-25 DIAGNOSIS — C3491 Malignant neoplasm of unspecified part of right bronchus or lung: Secondary | ICD-10-CM

## 2020-01-25 DIAGNOSIS — Z86711 Personal history of pulmonary embolism: Secondary | ICD-10-CM | POA: Insufficient documentation

## 2020-01-25 DIAGNOSIS — I10 Essential (primary) hypertension: Secondary | ICD-10-CM | POA: Diagnosis not present

## 2020-01-25 DIAGNOSIS — Z833 Family history of diabetes mellitus: Secondary | ICD-10-CM | POA: Diagnosis not present

## 2020-01-25 DIAGNOSIS — Z803 Family history of malignant neoplasm of breast: Secondary | ICD-10-CM | POA: Diagnosis not present

## 2020-01-25 DIAGNOSIS — E538 Deficiency of other specified B group vitamins: Secondary | ICD-10-CM | POA: Diagnosis not present

## 2020-01-25 DIAGNOSIS — Z87891 Personal history of nicotine dependence: Secondary | ICD-10-CM | POA: Insufficient documentation

## 2020-01-25 DIAGNOSIS — Z79899 Other long term (current) drug therapy: Secondary | ICD-10-CM | POA: Diagnosis not present

## 2020-01-25 DIAGNOSIS — Z90710 Acquired absence of both cervix and uterus: Secondary | ICD-10-CM | POA: Diagnosis not present

## 2020-01-25 DIAGNOSIS — Z7984 Long term (current) use of oral hypoglycemic drugs: Secondary | ICD-10-CM | POA: Diagnosis not present

## 2020-01-25 DIAGNOSIS — D649 Anemia, unspecified: Secondary | ICD-10-CM | POA: Insufficient documentation

## 2020-01-25 DIAGNOSIS — Z8249 Family history of ischemic heart disease and other diseases of the circulatory system: Secondary | ICD-10-CM | POA: Diagnosis not present

## 2020-01-25 DIAGNOSIS — E119 Type 2 diabetes mellitus without complications: Secondary | ICD-10-CM | POA: Insufficient documentation

## 2020-01-25 DIAGNOSIS — C3411 Malignant neoplasm of upper lobe, right bronchus or lung: Secondary | ICD-10-CM | POA: Diagnosis not present

## 2020-01-25 DIAGNOSIS — Z8052 Family history of malignant neoplasm of bladder: Secondary | ICD-10-CM | POA: Diagnosis not present

## 2020-01-25 LAB — IRON AND TIBC
Iron: 32 ug/dL (ref 28–170)
Saturation Ratios: 10 % — ABNORMAL LOW (ref 10.4–31.8)
TIBC: 311 ug/dL (ref 250–450)
UIBC: 279 ug/dL

## 2020-01-25 LAB — COMPREHENSIVE METABOLIC PANEL
ALT: 14 U/L (ref 0–44)
AST: 16 U/L (ref 15–41)
Albumin: 3.7 g/dL (ref 3.5–5.0)
Alkaline Phosphatase: 43 U/L (ref 38–126)
Anion gap: 13 (ref 5–15)
BUN: 16 mg/dL (ref 8–23)
CO2: 26 mmol/L (ref 22–32)
Calcium: 9.7 mg/dL (ref 8.9–10.3)
Chloride: 101 mmol/L (ref 98–111)
Creatinine, Ser: 0.88 mg/dL (ref 0.44–1.00)
GFR, Estimated: >60 mL/min (ref >60)
Glucose, Bld: 140 mg/dL — ABNORMAL HIGH (ref 70–99)
Potassium: 3.2 mmol/L — ABNORMAL LOW (ref 3.5–5.1)
Sodium: 140 mmol/L (ref 135–145)
Total Bilirubin: 0.5 mg/dL (ref 0.3–1.2)
Total Protein: 7.1 g/dL (ref 6.5–8.1)

## 2020-01-25 LAB — CBC WITH DIFFERENTIAL/PLATELET
Abs Immature Granulocytes: 0 10*3/uL (ref 0.00–0.07)
Basophils Absolute: 0 10*3/uL (ref 0.0–0.1)
Basophils Relative: 1 %
Eosinophils Absolute: 0.1 10*3/uL (ref 0.0–0.5)
Eosinophils Relative: 2 %
HCT: 33.3 % — ABNORMAL LOW (ref 36.0–46.0)
Hemoglobin: 10.3 g/dL — ABNORMAL LOW (ref 12.0–15.0)
Immature Granulocytes: 0 %
Lymphocytes Relative: 36 %
Lymphs Abs: 1.8 10*3/uL (ref 0.7–4.0)
MCH: 31.3 pg (ref 26.0–34.0)
MCHC: 30.9 g/dL (ref 30.0–36.0)
MCV: 101.2 fL — ABNORMAL HIGH (ref 80.0–100.0)
Monocytes Absolute: 0.5 10*3/uL (ref 0.1–1.0)
Monocytes Relative: 10 %
Neutro Abs: 2.5 10*3/uL (ref 1.7–7.7)
Neutrophils Relative %: 51 %
Platelets: 329 10*3/uL (ref 150–400)
RBC: 3.29 MIL/uL — ABNORMAL LOW (ref 3.87–5.11)
RDW: 14.8 % (ref 11.5–15.5)
WBC: 4.9 10*3/uL (ref 4.0–10.5)
nRBC: 0 % (ref 0.0–0.2)

## 2020-01-25 LAB — FERRITIN: Ferritin: 19 ng/mL (ref 11–307)

## 2020-01-28 DIAGNOSIS — Z23 Encounter for immunization: Secondary | ICD-10-CM | POA: Diagnosis not present

## 2020-01-31 ENCOUNTER — Ambulatory Visit (INDEPENDENT_AMBULATORY_CARE_PROVIDER_SITE_OTHER): Payer: Medicare Other | Admitting: Cardiology

## 2020-01-31 ENCOUNTER — Encounter: Payer: Self-pay | Admitting: Cardiology

## 2020-01-31 VITALS — BP 148/64 | HR 75 | Ht 64.0 in | Wt 182.8 lb

## 2020-01-31 DIAGNOSIS — I6523 Occlusion and stenosis of bilateral carotid arteries: Secondary | ICD-10-CM

## 2020-01-31 DIAGNOSIS — I10 Essential (primary) hypertension: Secondary | ICD-10-CM

## 2020-01-31 DIAGNOSIS — I2510 Atherosclerotic heart disease of native coronary artery without angina pectoris: Secondary | ICD-10-CM

## 2020-01-31 DIAGNOSIS — E782 Mixed hyperlipidemia: Secondary | ICD-10-CM

## 2020-01-31 MED ORDER — POTASSIUM CHLORIDE CRYS ER 20 MEQ PO TBCR: EXTENDED_RELEASE_TABLET | ORAL | 0 refills | Status: DC

## 2020-01-31 MED ORDER — LISINOPRIL 40 MG PO TABS: 40.0000 mg | ORAL_TABLET | Freq: Every day | ORAL | 1 refills | Status: DC

## 2020-01-31 MED ORDER — POTASSIUM CHLORIDE CRYS ER 20 MEQ PO TBCR
EXTENDED_RELEASE_TABLET | ORAL | 0 refills | Status: DC
Start: 2020-01-31 — End: 2020-01-31

## 2020-01-31 MED ORDER — LISINOPRIL 40 MG PO TABS
40.0000 mg | ORAL_TABLET | Freq: Every day | ORAL | 1 refills | Status: DC
Start: 2020-01-31 — End: 2020-01-31

## 2020-01-31 NOTE — Progress Notes (Signed)
Clinical Summary Karen Tate is a 74 y.o.female former patient of Dr Bronson Ing, this is our first visit together.  1. CAD - prior CABG in 08/2017 (left internal mammary artery to left anterior descending, sequential saphenous vein graft to ramus intermedius branches 1 and 2). She presented with an NSTEMI -  10/2018 echo LVEF 60-65%  - no recent chest pain. No SOB or DOE - compliant with meds - occasional LE edema  2. Carotid stenosis - 6/20219 carotid US: RICA 1-39%, RICA 40-59%   3. Hyperlipidemia  4. HTN - pcp recented increased her lisinopril from 2.5mg  to 10mg  daily, now up to 20mg  - home sbps 150s to 160s -  5. Mitral regurgitation - 10/2018 echo moderate MR   Has had COVID shot x 2 moderna.  Past Medical History:  Diagnosis Date  . Anxiety neurosis   . Arthritis   . Asthma   . CAD (coronary artery disease)    a. 08/2017: abnormal nuc-> sent directly to [facility], NSTEMI, s/p CABG (left internal mammary artery to left anterior descending, sequential saphenous vein graft to ramus intermedius branches 1 and 2).  . Carotid artery disease ([place])   . Chronic anemia   . Dizziness   . GERD (gastroesophageal reflux disease)   . Glaucoma   . Heart disease   . Hiatal hernia   . Hyperlipemia   . Hypertension   . Ischemic cardiomyopathy   . Joint pain   . Mitral regurgitation   . Postoperative atrial fibrillation ([place]) 08/2017  . Pulmonary embolus ([place])   . Type 2 diabetes mellitus (HCC)      Allergies  Allergen Reactions  . Beta Adrenergic Blockers Other (See Comments)    Dropped heart rate to the 40's  . Calcium Channel Blockers Other (See Comments)    Dropped HR into the 40's  . Naproxen Hives     Current Outpatient Medications  Medication Sig Dispense Refill  . acetaminophen (TYLENOL) 500 MG tablet Take 1,000 mg by mouth every 6 (six) hours as needed for moderate pain. (Patient not taking: Reported on 11/01/2019)    . ALPRAZolam (XANAX) 0.5 MG tablet Take 0.5  mg by mouth 2 (two) times daily.    Marland Kitchen amLODipine (NORVASC) 10 MG tablet Take 10 mg by mouth daily.    Marland Kitchen aspirin 81 MG tablet Take 1 tablet (81 mg total) by mouth daily.    Marland Kitchen atorvastatin (LIPITOR) 80 MG tablet Take 1 tablet (80 mg total) by mouth daily at 6 PM. 90 tablet 3  . carvedilol (COREG) 12.5 MG tablet Take 1 tablet (12.5 mg total) by mouth 2 (two) times daily with a meal. 60 tablet 1  . chlorthalidone (HYGROTON) 25 MG tablet Take 25 mg by mouth 2 (two) times daily.     . ferrous sulfate 325 (65 FE) MG tablet Take 325 mg by mouth 2 (two) times daily.    . fish oil-omega-3 fatty acids 1000 MG capsule Take 1 g by mouth 3 (three) times daily.     Marland Kitchen glipiZIDE (GLUCOTROL) 5 MG tablet Take 5 mg by mouth 2 (two) times daily before a meal.    . latanoprost (XALATAN) 0.005 % ophthalmic solution Place 1 drop into both eyes at bedtime.    Marland Kitchen lisinopril (ZESTRIL) 2.5 MG tablet Take 1 tablet (2.5 mg total) by mouth daily. 90 tablet 1  . magnesium oxide (MAG-OX) 400 MG tablet Take 1 tablet (400 mg total) by mouth 2 (two) times daily. (Patient  taking differently: Take 400 mg by mouth daily. ) 180 tablet 3  . meclizine (ANTIVERT) 25 MG tablet Take 25 mg by mouth 3 (three) times daily as needed for dizziness. (Patient not taking: Reported on 11/01/2019)    . metFORMIN (GLUCOPHAGE) 1000 MG tablet Take 1,000 mg by mouth 2 (two) times daily with a meal.    . Multiple Vitamins-Minerals (CENTRUM SILVER 50+WOMEN) TABS Take 1 tablet by mouth daily.     Marland Kitchen omeprazole (PRILOSEC) 40 MG capsule Take 40 mg by mouth daily.    Marland Kitchen oxyCODONE (OXY IR/ROXICODONE) 5 MG immediate release tablet Take 1 tablet (5 mg total) by mouth every 6 (six) hours as needed for moderate pain. (Patient not taking: Reported on 11/01/2019) 20 tablet 0  . tiZANidine (ZANAFLEX) 4 MG tablet Take 1 tablet (4 mg total) by mouth every 6 (six) hours as needed for muscle spasms. (Patient not taking: Reported on 11/01/2019) 60 tablet 0   No current  facility-administered medications for this visit.     Past Surgical History:  Procedure Laterality Date  . BACK SURGERY  2016  . CORONARY ARTERY BYPASS GRAFT N/A 09/18/2017   Procedure: CORONARY ARTERY BYPASS GRAFTING (CABG);  Surgeon: [name], MD;  Location: [facility];  Service: Open Heart Surgery;  Laterality: N/A;  Saphenous vein harvest. LIMA to LAD Saphenous vein (sequential) ramus 1 & 2  . EYE SURGERY Bilateral    "surgery for glaucoma"  . HERNIA REPAIR    . INTERCOSTAL NERVE BLOCK Right 05/13/2019   Procedure: Intercostal Nerve Block;  Surgeon: [name], MD;  Location: [facility];  Service: Thoracic;  Laterality: Right;  . LEFT HEART CATH AND CORONARY ANGIOGRAPHY N/A 09/15/2017   Procedure: LEFT HEART CATH AND CORONARY ANGIOGRAPHY;  Surgeon: Lorretta Harp, MD;  Location: [facility];  Service: Cardiovascular;  Laterality: N/A;  . LYMPH NODE DISSECTION Right 05/13/2019   Procedure: Lymph Node Dissection;  Surgeon: [name], MD;  Location: [facility];  Service: Thoracic;  Laterality: Right;  . ROTATOR CUFF REPAIR Left    x2  . SHOULDER SURGERY     LEFT  . TEE WITHOUT CARDIOVERSION N/A 09/18/2017   Procedure: TRANSESOPHAGEAL ECHOCARDIOGRAM (TEE);  Surgeon: [name], MD;  Location: [facility];  Service: Open Heart Surgery;  Laterality: N/A;  . TOTAL KNEE ARTHROPLASTY     LEFT  . VAGINAL HYSTERECTOMY     partial     Allergies  Allergen Reactions  . Beta Adrenergic Blockers Other (See Comments)    Dropped heart rate to the 40's  . Calcium Channel Blockers Other (See Comments)    Dropped HR into the 40's  . Naproxen Hives      Family History  Problem Relation Age of Onset  . Diabetes Father   . Heart disease Father   . Breast cancer Mother   . Congestive Heart Failure Brother   . Anemia Daughter   . Seizures Daughter   . Migraines Daughter   . Diabetes Daughter   . Hypertension Daughter   . Bladder Cancer Son   .  Congestive Heart Failure Son   . Congestive Heart Failure Son      Social History [name] reports that she quit smoking about 41 years ago. Her smoking use included cigarettes. She has a 20.00 pack-year smoking history. She has never used smokeless tobacco. Ms. Criado reports no history of alcohol use.   Review of Systems CONSTITUTIONAL: No weight loss, fever, chills,  weakness or fatigue.  HEENT: Eyes: No visual loss, blurred vision, double vision or yellow sclerae.No hearing loss, sneezing, congestion, runny nose or sore throat.  SKIN: No rash or itching.  CARDIOVASCULAR: per hpi RESPIRATORY: No shortness of breath, cough or sputum.  GASTROINTESTINAL: No anorexia, nausea, vomiting or diarrhea. No abdominal pain or blood.  GENITOURINARY: No burning on urination, no polyuria NEUROLOGICAL: No headache, dizziness, syncope, paralysis, ataxia, numbness or tingling in the extremities. No change in bowel or bladder control.  MUSCULOSKELETAL: No muscle, back pain, joint pain or stiffness.  LYMPHATICS: No enlarged nodes. No history of splenectomy.  PSYCHIATRIC: No history of depression or anxiety.  ENDOCRINOLOGIC: No reports of sweating, cold or heat intolerance. No polyuria or polydipsia.  Marland Kitchen   Physical Examination Today's Vitals   01/31/20 1257  BP: (!) 148/64  Pulse: 75  SpO2: 92%  Weight: 182 lb 12.8 oz (82.9 kg)  Height: 5\' 4"  (1.626 m)   Body mass index is 31.38 kg/m.  Gen: resting comfortably, no acute distress HEENT: no scleral icterus, pupils equal round and reactive, no palptable cervical adenopathy,  CV: RRR, 2/6 systolic murmur apex, no jvd Resp: Clear to auscultation bilaterally GI: abdomen is soft, non-tender, non-distended, normal bowel sounds, no hepatosplenomegaly MSK: extremities are warm, no edema.  Skin: warm, no rash Neuro:  no focal deficits Psych: appropriate affect   Diagnostic Studies Echocardiogram 11/18/2018: 1. The left ventricle has normal  systolic function with an ejection  fraction of 60-65%. The cavity size was normal. Left ventricular diastolic  parameters were normal.  2. The right ventricle has mildly reduced systolic function. The cavity  was normal. There is no increase in right ventricular wall thickness.  Right ventricular systolic pressure is moderately elevated with an  estimated pressure of 49.5 mmHg.  3. The aortic valve is tricuspid. Mild calcification of the aortic valve.  Mild to moderate aortic annular calcification noted.  4. The mitral valve is degenerative. Mild thickening of the mitral valve  leaflet. Mild calcification of the mitral valve leaflet. Mitral valve  regurgitation is moderate by color flow Doppler. The MR jet is eccentric  posteriorly directed.  5. The tricuspid valve is grossly normal.  6. The aorta is normal unless otherwise noted.     Assessment and Plan  1. CAD - no symptoms, continue current meds  2. HTN - remains elevated, increase lisinopril to 40mg  daily  3. Hypokalemia - give KCl 13mEq x 3 days  4. Hyperlipidemia - continue statin, request pcp labs  5. Carotid stenosis - repeat carotid US   F/u 6 months   Arnoldo Lenis, M.D.

## 2020-01-31 NOTE — Patient Instructions (Signed)
Your physician wants you to follow-up in: [place] will receive a reminder letter in the mail two months in advance. If you don't receive a letter, please call our office to schedule the follow-up appointment.  Your physician has recommended you make the following change in your medication:   INCREASE LISINOPRIL 40 MG DAILY  TAKE POTASSIUM 40 MEQ DAILY FOR 3 DAYS  Your physician has requested that you have a carotid duplex. This test is an ultrasound of the carotid arteries in your neck. It looks at blood flow through these arteries that supply the brain with blood. Allow one hour for this exam. There are no restrictions or special instructions.  Thank you for choosing [facility]!!

## 2020-02-01 ENCOUNTER — Ambulatory Visit (HOSPITAL_COMMUNITY): Payer: Medicare Other | Admitting: Hematology

## 2020-02-02 ENCOUNTER — Inpatient Hospital Stay (HOSPITAL_BASED_OUTPATIENT_CLINIC_OR_DEPARTMENT_OTHER): Payer: Medicare Other | Admitting: Hematology

## 2020-02-02 VITALS — BP 183/67 | HR 74 | Temp 96.9°F | Resp 18 | Wt 181.9 lb

## 2020-02-02 DIAGNOSIS — C3491 Malignant neoplasm of unspecified part of right bronchus or lung: Secondary | ICD-10-CM

## 2020-02-02 DIAGNOSIS — C3411 Malignant neoplasm of upper lobe, right bronchus or lung: Secondary | ICD-10-CM | POA: Diagnosis not present

## 2020-02-02 DIAGNOSIS — Z87891 Personal history of nicotine dependence: Secondary | ICD-10-CM | POA: Diagnosis not present

## 2020-02-02 DIAGNOSIS — E538 Deficiency of other specified B group vitamins: Secondary | ICD-10-CM | POA: Diagnosis not present

## 2020-02-02 DIAGNOSIS — E119 Type 2 diabetes mellitus without complications: Secondary | ICD-10-CM | POA: Diagnosis not present

## 2020-02-02 DIAGNOSIS — I10 Essential (primary) hypertension: Secondary | ICD-10-CM | POA: Diagnosis not present

## 2020-02-02 DIAGNOSIS — D649 Anemia, unspecified: Secondary | ICD-10-CM | POA: Diagnosis not present

## 2020-02-02 NOTE — Progress Notes (Signed)
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  PRIOR THERAPY: Right upper lobectomy on 05/13/2019  NGS Results: Not done  CURRENT THERAPY: Observation  BRIEF ONCOLOGIC HISTORY:  Oncology History  Non-small cell cancer of right lung ([place])  06/29/2019 Initial Diagnosis   Non-small cell cancer of right lung ([place])   06/29/2019 Cancer Staging   Staging form: Lung, AJCC 8th Edition - Clinical stage from 06/29/2019: Stage IA2 (cT1b, cN0, cM0) - Signed by Derek Jack, MD on 06/29/2019     CANCER STAGING: Cancer Staging Non-small cell cancer of right lung [facility]) Staging form: Lung, AJCC 8th Edition - Clinical stage from 06/29/2019: Stage IA2 (cT1b, cN0, cM0) - Signed by Derek Jack, MD on 06/29/2019   INTERVAL HISTORY:  [name], a 74 y.o. female, returns for routine follow-up of her stage I right lung adenocarcinoma. Karen Tate was last seen on 11/01/2019.   Today she is accompanied by her husband and she reports feeling okay. She complains that her appetite is not good, though she started drinking 2 cans of Ensure daily. She is taking vitamin B12, but her energy levels are still low. She takes iron tablets daily and has melena, but denies hematochezia or hematuria.   REVIEW OF SYSTEMS:  Review of Systems  Constitutional: Positive for appetite change (30%) and fatigue.  Gastrointestinal: Positive for nausea. Negative for blood in stool.  Genitourinary: Negative for hematuria.   Musculoskeletal: Positive for back pain (5/10 back pain).  Psychiatric/Behavioral: Positive for depression.  All other systems reviewed and are negative.   PAST MEDICAL/SURGICAL HISTORY:  Past Medical History:  Diagnosis Date  . Anxiety neurosis   . Arthritis   . Asthma   .  CAD (coronary artery disease)    a. 08/2017: abnormal nuc-> sent directly to [facility], NSTEMI, s/p CABG (left internal mammary artery to left anterior descending, sequential saphenous vein graft to ramus intermedius branches 1 and 2).  . Carotid artery disease ([place])   . Chronic anemia   . Dizziness   . GERD (gastroesophageal reflux disease)   . Glaucoma   . Heart disease   . Hiatal hernia   . Hyperlipemia   . Hypertension   . Ischemic cardiomyopathy   . Joint pain   . Mitral regurgitation   . Postoperative atrial fibrillation ([place]) 08/2017  . Pulmonary embolus ([place])   . Type 2 diabetes mellitus ([place])    Past Surgical History:  Procedure Laterality Date  . BACK SURGERY  2016  . CORONARY ARTERY BYPASS GRAFT N/A 09/18/2017   Procedure: CORONARY ARTERY BYPASS GRAFTING (CABG);  Surgeon: [name], MD;  Location: [facility];  Service: Open Heart Surgery;  Laterality: N/A;  Saphenous vein harvest. LIMA to LAD Saphenous vein (sequential) ramus 1 & 2  . EYE SURGERY Bilateral    "surgery for glaucoma"  . HERNIA REPAIR    . INTERCOSTAL NERVE BLOCK Right 05/13/2019   Procedure: Intercostal Nerve Block;  Surgeon: [name], MD;  Location: [facility];  Service: Thoracic;  Laterality: Right;  . LEFT HEART CATH AND CORONARY ANGIOGRAPHY N/A 09/15/2017   Procedure: LEFT HEART CATH AND CORONARY ANGIOGRAPHY;  Surgeon: Lorretta Harp, MD;  Location: [facility];  Service: Cardiovascular;  Laterality: N/A;  . LYMPH  NODE DISSECTION Right 05/13/2019   Procedure: Lymph Node Dissection;  Surgeon: [name], MD;  Location: [facility];  Service: Thoracic;  Laterality: Right;  . ROTATOR CUFF REPAIR Left    x2  . SHOULDER SURGERY     LEFT  . TEE WITHOUT CARDIOVERSION N/A 09/18/2017   Procedure: TRANSESOPHAGEAL ECHOCARDIOGRAM (TEE);  Surgeon: [name], MD;  Location: [facility];  Service: Open Heart Surgery;  Laterality: N/A;  . TOTAL KNEE ARTHROPLASTY     LEFT  . VAGINAL  HYSTERECTOMY     partial    SOCIAL HISTORY:  Social History   Socioeconomic History  . Marital status: Married    Spouse name: [name]  . Number of children: 5  . Years of education: Not on file  . Highest education level: Not on file  Occupational History  . Not on file  Tobacco Use  . Smoking status: Former Smoker    Packs/day: 1.25    Years: 16.00    Pack years: 20.00    Types: Cigarettes    Quit date: 03/25/1978    Years since quitting: 41.8  . Smokeless tobacco: Never Used  . Tobacco comment: quit more than 30 years ago  Vaping Use  . Vaping Use: Never used  Substance and Sexual Activity  . Alcohol use: No  . Drug use: No  . Sexual activity: Not on file  Other Topics Concern  . Not on file  Social History Narrative  . Not on file   Social Determinants of Health   Financial Resource Strain: Low Risk   . Difficulty of Paying Living Expenses: Not hard at all  Food Insecurity: No Food Insecurity  . Worried About Charity fundraiser in the Last Year: Never true  . Ran Out of Food in the Last Year: Never true  Transportation Needs: No Transportation Needs  . Lack of Transportation (Medical): No  . Lack of Transportation (Non-Medical): No  Physical Activity: Inactive  . Days of Exercise per Week: 0 days  . Minutes of Exercise per Session: 0 min  Stress: No Stress Concern Present  . Feeling of Stress : Not at all  Social Connections: Moderately Integrated  . Frequency of Communication with Friends and Family: More than three times a week  . Frequency of Social Gatherings with Friends and Family: More than three times a week  . Attends Religious Services: More than 4 times per year  . Active Member of Clubs or Organizations: No  . Attends Archivist Meetings: Never  . Marital Status: Married  Human resources officer Violence: Not At Risk  . Fear of Current or Ex-Partner: No  . Emotionally Abused: No  . Physically Abused: No  . Sexually Abused: No    FAMILY  HISTORY:  Family History  Problem Relation Age of Onset  . Diabetes Father   . Heart disease Father   . Breast cancer Mother   . Congestive Heart Failure Brother   . Anemia Daughter   . Seizures Daughter   . Migraines Daughter   . Diabetes Daughter   . Hypertension Daughter   . Bladder Cancer Son   . Congestive Heart Failure Son   . Congestive Heart Failure Son     CURRENT MEDICATIONS:  Current Outpatient Medications  Medication Sig Dispense Refill  . acetaminophen (TYLENOL) 500 MG tablet Take 1,000 mg by mouth every 6 (six) hours as needed for moderate pain.     Marland Kitchen ALPRAZolam (XANAX) 0.5 MG tablet Take  0.5 mg by mouth 2 (two) times daily.    Marland Kitchen amLODipine (NORVASC) 10 MG tablet Take 10 mg by mouth daily.    Marland Kitchen aspirin 81 MG tablet Take 1 tablet (81 mg total) by mouth daily.    Marland Kitchen atorvastatin (LIPITOR) 80 MG tablet Take 1 tablet (80 mg total) by mouth daily at 6 PM. 90 tablet 3  . carvedilol (COREG) 25 MG tablet Take 1 tablet by mouth 2 (two) times daily.    . ferrous sulfate 325 (65 FE) MG tablet Take 325 mg by mouth 2 (two) times daily.    . fish oil-omega-3 fatty acids 1000 MG capsule Take 1 g by mouth 3 (three) times daily.     . furosemide (LASIX) 20 MG tablet Take 20 mg by mouth daily.    Marland Kitchen glipiZIDE (GLUCOTROL) 5 MG tablet Take 5 mg by mouth 2 (two) times daily before a meal.    . latanoprost (XALATAN) 0.005 % ophthalmic solution Place 1 drop into both eyes at bedtime.    Marland Kitchen lisinopril (ZESTRIL) 40 MG tablet Take 1 tablet (40 mg total) by mouth daily. 90 tablet 1  . magnesium oxide (MAG-OX) 400 MG tablet Take 1 tablet (400 mg total) by mouth 2 (two) times daily. (Patient taking differently: Take 400 mg by mouth daily. ) 180 tablet 3  . meclizine (ANTIVERT) 25 MG tablet Take 25 mg by mouth 3 (three) times daily as needed for dizziness.     . metFORMIN (GLUCOPHAGE) 1000 MG tablet Take 1,000 mg by mouth daily.     . Multiple Vitamins-Minerals (CENTRUM SILVER 50+WOMEN) TABS Take 1  tablet by mouth daily.     Marland Kitchen omeprazole (PRILOSEC) 40 MG capsule Take 40 mg by mouth daily.    . potassium chloride SA (KLOR-CON) 20 MEQ tablet TAKE 2 TABLETS DAILY FOR 3 DAYS 6 tablet 0  . tiZANidine (ZANAFLEX) 4 MG tablet Take 1 tablet (4 mg total) by mouth every 6 (six) hours as needed for muscle spasms. 60 tablet 0  . vitamin B-12 (CYANOCOBALAMIN) 500 MCG tablet Take 500 mcg by mouth daily.     No current facility-administered medications for this visit.    ALLERGIES:  Allergies  Allergen Reactions  . Beta Adrenergic Blockers Other (See Comments)    Dropped heart rate to the 40's  . Calcium Channel Blockers Other (See Comments)    Dropped HR into the 40's  . Naproxen Hives    PHYSICAL EXAM:  Performance status (ECOG): 1 - Symptomatic but completely ambulatory  Vitals:   02/02/20 1132  BP: (!) 183/67  Pulse: 74  Resp: 18  Temp: (!) 96.9 F (36.1 C)  SpO2: 100%   Wt Readings from Last 3 Encounters:  02/02/20 181 lb 14.1 oz (82.5 kg)  01/31/20 182 lb 12.8 oz (82.9 kg)  11/01/19 178 lb 6.4 oz (80.9 kg)   Physical Exam Vitals reviewed.  Constitutional:      Appearance: Normal appearance.  Cardiovascular:     Rate and Rhythm: Normal rate and regular rhythm.     Pulses: Normal pulses.     Heart sounds: Normal heart sounds.  Pulmonary:     Effort: Pulmonary effort is normal.     Breath sounds: Normal breath sounds.  Neurological:     General: No focal deficit present.     Mental Status: She is alert and oriented to person, place, and time.  Psychiatric:        Mood and Affect: Mood normal.  Behavior: Behavior normal.      LABORATORY DATA:  I have reviewed the labs as listed.  CBC Latest Ref Rng & Units 01/25/2020 10/27/2019 06/29/2019  WBC 4.0 - 10.5 K/uL 4.9 4.9 5.4  Hemoglobin 12.0 - 15.0 g/dL 10.3(L) 11.9(L) 9.3(L)  Hematocrit 36 - 46 % 33.3(L) 36.9 32.4(L)  Platelets 150 - 400 K/uL 329 294 567(H)   CMP Latest Ref Rng & Units 01/25/2020 10/27/2019  05/15/2019  Glucose 70 - 99 mg/dL 140(H) - 276(H)  BUN 8 - 23 mg/dL 16 - 25(H)  Creatinine 0.44 - 1.00 mg/dL 0.88 1.00 1.23(H)  Sodium 135 - 145 mmol/L 140 - 136  Potassium 3.5 - 5.1 mmol/L 3.2(L) - 4.1  Chloride 98 - 111 mmol/L 101 - 101  CO2 22 - 32 mmol/L 26 - 24  Calcium 8.9 - 10.3 mg/dL 9.7 - 8.8(L)  Total Protein 6.5 - 8.1 g/dL 7.1 - 5.7(L)  Total Bilirubin 0.3 - 1.2 mg/dL 0.5 - 0.9  Alkaline Phos 38 - 126 U/L 43 - 37(L)  AST 15 - 41 U/L 16 - 42(H)  ALT 0 - 44 U/L 14 - 35   Lab Results  Component Value Date   TIBC 311 01/25/2020   TIBC 316 10/27/2019   TIBC 411 06/29/2019   FERRITIN 19 01/25/2020   FERRITIN 88 10/27/2019   FERRITIN 10 (L) 06/29/2019   IRONPCTSAT 10 (L) 01/25/2020   IRONPCTSAT 19 10/27/2019   IRONPCTSAT 7 (L) 06/29/2019    DIAGNOSTIC IMAGING:  I have independently reviewed the scans and discussed with the patient. No results found.   ASSESSMENT:  1. 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 -PET CT scan on 04/26/2019 showed mildly hypermetabolic 1.1 cm right upper lobe pulmonary nodule with SUV 2.4. No findings of adenopathy or metastatic disease. -Right upper lobectomy on 05/13/2019 shows 1.3 cm invasive adenocarcinoma, negative for visceral pleural invasion, LVI negative, margins negative. Lymph nodes from stations 7, 11 R, 10 R, 4R, 12Rare negative. -CT chest surveillance every 6 months for 2 to 3 years was recommended followed by noncontrast CT annually. -CT chest on 10/27/2019 showed surgical changes in the right upper lobe lobectomy with no findings of recurrence.  2 small indeterminate right lower lobe pulmonary nodules, follow-up recommended.  2. Normocytic anemia: -Her previous hemoglobin was 7.6 with MCV of 88. -She has mild degree of chronic kidney  disease which is likely contributing to her anemia. -Colonoscopy earlier this year was reportedly normal. -Feraheme on 07/07/2019 and 07/14/2019.  3. Weight loss: -She lost about 20-25 pounds since her surgery. -She is slowly gaining back. Her appetite is improving.   PLAN:  1. Stage I (PT 1 BPN 0) right upper lobe adenocarcinoma: -She denies any chest pains or hemoptysis.  Last CT scan was from 10/27/2019. -RTC 3 months with repeat CT of the chest.  2. Normocytic anemia: -We reviewed her labs from 01/25/2020.  Hemoglobin dropped to 10.3 from 11.9.  Ferritin is 19 from 88. -She reports taking iron tablet daily. -I have recommended Feraheme infusion x2. -She was told to stop taking iron tablet as they're not helping.  3.  Vitamin B12 deficiency: -She will continue B12 1 tablet daily.  Will check B12 at next visit.   Orders placed this encounter:  No orders of the defined types  were placed in this encounter.    Derek Jack, MD [place] [phone]   I, Milinda Antis, am acting as a scribe for Dr. Sanda Linger.  I, Derek Jack MD, have reviewed the above documentation for accuracy and completeness, and I agree with the above.

## 2020-02-02 NOTE — Patient Instructions (Addendum)
[place] at [facility] Discharge Instructions  You were seen today by Dr. Delton Coombes. He went over your recent results and scans. Stop taking the iron tablets. You will be scheduled to receive 2 iron infusions 1 week apart. You will also be scheduled for a CT scan of your chest before your next visit. Dr. Delton Coombes will see you back in 3 months for labs and follow up.   Thank you for choosing [place] at [facility] to provide your oncology and hematology care.  To afford each patient quality time with our provider, please arrive at least 15 minutes before your scheduled appointment time.   If you have a lab appointment with the [place] please come in thru the Main Entrance and check in at the main information desk  You need to re-schedule your appointment should you arrive 10 or more minutes late.  We strive to give you quality time with our providers, and arriving late affects you and other patients whose appointments are after yours.  Also, if you no show three or more times for appointments you may be dismissed from the clinic at the providers discretion.     Again, thank you for choosing [facility].  Our hope is that these requests will decrease the amount of time that you wait before being seen by our physicians.       _____________________________________________________________  Should you have questions after your visit to [facility], please contact our office at [phone] between the hours of 8:00 a.m. and 4:30 p.m.  Voicemails left after 4:00 p.m. will not be returned until the following business day.  For prescription refill requests, have your pharmacy contact our office and allow 72 hours.    Cancer Center Support Programs:   > Cancer Support Group  2nd Tuesday of the month 1pm-2pm, Journey Room

## 2020-02-03 ENCOUNTER — Encounter: Payer: Self-pay | Admitting: *Deleted

## 2020-02-08 DIAGNOSIS — J40 Bronchitis, not specified as acute or chronic: Secondary | ICD-10-CM | POA: Diagnosis not present

## 2020-02-10 ENCOUNTER — Other Ambulatory Visit: Payer: Self-pay | Admitting: Cardiology

## 2020-02-10 ENCOUNTER — Ambulatory Visit (INDEPENDENT_AMBULATORY_CARE_PROVIDER_SITE_OTHER): Payer: Medicare Other

## 2020-02-10 DIAGNOSIS — I6523 Occlusion and stenosis of bilateral carotid arteries: Secondary | ICD-10-CM | POA: Diagnosis not present

## 2020-02-11 ENCOUNTER — Encounter (HOSPITAL_COMMUNITY): Payer: Self-pay

## 2020-02-11 ENCOUNTER — Other Ambulatory Visit: Payer: Self-pay

## 2020-02-11 ENCOUNTER — Inpatient Hospital Stay (HOSPITAL_COMMUNITY): Payer: Medicare Other

## 2020-02-11 VITALS — BP 124/49 | HR 60 | Temp 97.2°F | Resp 18

## 2020-02-11 DIAGNOSIS — E538 Deficiency of other specified B group vitamins: Secondary | ICD-10-CM | POA: Diagnosis not present

## 2020-02-11 DIAGNOSIS — I10 Essential (primary) hypertension: Secondary | ICD-10-CM | POA: Diagnosis not present

## 2020-02-11 DIAGNOSIS — D649 Anemia, unspecified: Secondary | ICD-10-CM

## 2020-02-11 DIAGNOSIS — Z87891 Personal history of nicotine dependence: Secondary | ICD-10-CM | POA: Diagnosis not present

## 2020-02-11 DIAGNOSIS — C3411 Malignant neoplasm of upper lobe, right bronchus or lung: Secondary | ICD-10-CM | POA: Diagnosis not present

## 2020-02-11 DIAGNOSIS — E119 Type 2 diabetes mellitus without complications: Secondary | ICD-10-CM | POA: Diagnosis not present

## 2020-02-11 MED ORDER — SODIUM CHLORIDE 0.9 % IV SOLN
510.0000 mg | Freq: Once | INTRAVENOUS | Status: AC
  Administered 2020-02-11: 510 mg via INTRAVENOUS
  Filled 2020-02-11: qty 510

## 2020-02-11 MED ORDER — SODIUM CHLORIDE 0.9 % IV SOLN: INTRAVENOUS | Status: DC

## 2020-02-11 NOTE — Progress Notes (Signed)
Tolerated Feraheme infusion w/o adverse reaction.  Alert, in no distress.  VSS  Discharged via wheelchair in stable condition.

## 2020-02-14 ENCOUNTER — Emergency Department (HOSPITAL_COMMUNITY)
Admission: EM | Admit: 2020-02-14 | Discharge: 2020-02-14 | Disposition: A | Discharge status: Home / Self Care | Payer: Medicare Other | Attending: Emergency Medicine | Admitting: Emergency Medicine

## 2020-02-14 ENCOUNTER — Emergency Department (HOSPITAL_COMMUNITY): Payer: Medicare Other

## 2020-02-14 ENCOUNTER — Other Ambulatory Visit: Payer: Self-pay

## 2020-02-14 ENCOUNTER — Encounter (HOSPITAL_COMMUNITY): Payer: Self-pay | Admitting: *Deleted

## 2020-02-14 DIAGNOSIS — I10 Essential (primary) hypertension: Secondary | ICD-10-CM | POA: Insufficient documentation

## 2020-02-14 DIAGNOSIS — J069 Acute upper respiratory infection, unspecified: Secondary | ICD-10-CM | POA: Diagnosis not present

## 2020-02-14 DIAGNOSIS — Z7982 Long term (current) use of aspirin: Secondary | ICD-10-CM | POA: Insufficient documentation

## 2020-02-14 DIAGNOSIS — B9789 Other viral agents as the cause of diseases classified elsewhere: Secondary | ICD-10-CM | POA: Diagnosis not present

## 2020-02-14 DIAGNOSIS — I2510 Atherosclerotic heart disease of native coronary artery without angina pectoris: Secondary | ICD-10-CM | POA: Insufficient documentation

## 2020-02-14 DIAGNOSIS — Z7984 Long term (current) use of oral hypoglycemic drugs: Secondary | ICD-10-CM | POA: Insufficient documentation

## 2020-02-14 DIAGNOSIS — Z951 Presence of aortocoronary bypass graft: Secondary | ICD-10-CM | POA: Insufficient documentation

## 2020-02-14 DIAGNOSIS — Z87891 Personal history of nicotine dependence: Secondary | ICD-10-CM | POA: Diagnosis not present

## 2020-02-14 DIAGNOSIS — R062 Wheezing: Secondary | ICD-10-CM | POA: Diagnosis not present

## 2020-02-14 DIAGNOSIS — Z20822 Contact with and (suspected) exposure to covid-19: Secondary | ICD-10-CM | POA: Diagnosis not present

## 2020-02-14 DIAGNOSIS — R0602 Shortness of breath: Secondary | ICD-10-CM | POA: Diagnosis not present

## 2020-02-14 DIAGNOSIS — Z96652 Presence of left artificial knee joint: Secondary | ICD-10-CM | POA: Diagnosis not present

## 2020-02-14 DIAGNOSIS — I5022 Chronic systolic (congestive) heart failure: Secondary | ICD-10-CM | POA: Diagnosis not present

## 2020-02-14 DIAGNOSIS — Z79899 Other long term (current) drug therapy: Secondary | ICD-10-CM | POA: Diagnosis not present

## 2020-02-14 DIAGNOSIS — J45909 Unspecified asthma, uncomplicated: Secondary | ICD-10-CM | POA: Diagnosis not present

## 2020-02-14 DIAGNOSIS — R059 Cough, unspecified: Secondary | ICD-10-CM | POA: Diagnosis not present

## 2020-02-14 DIAGNOSIS — E119 Type 2 diabetes mellitus without complications: Secondary | ICD-10-CM | POA: Insufficient documentation

## 2020-02-14 DIAGNOSIS — J9811 Atelectasis: Secondary | ICD-10-CM | POA: Diagnosis not present

## 2020-02-14 DIAGNOSIS — K449 Diaphragmatic hernia without obstruction or gangrene: Secondary | ICD-10-CM | POA: Diagnosis not present

## 2020-02-14 DIAGNOSIS — J90 Pleural effusion, not elsewhere classified: Secondary | ICD-10-CM | POA: Diagnosis not present

## 2020-02-14 DIAGNOSIS — I517 Cardiomegaly: Secondary | ICD-10-CM | POA: Diagnosis not present

## 2020-02-14 LAB — URINALYSIS, ROUTINE W REFLEX MICROSCOPIC
Bilirubin Urine: NEGATIVE
Glucose, UA: NEGATIVE mg/dL
Hgb urine dipstick: NEGATIVE
Ketones, ur: NEGATIVE mg/dL
Leukocytes,Ua: NEGATIVE
Nitrite: NEGATIVE
Protein, ur: NEGATIVE mg/dL
Specific Gravity, Urine: 1.017 (ref 1.005–1.030)
pH: 8 (ref 5.0–8.0)

## 2020-02-14 LAB — COMPREHENSIVE METABOLIC PANEL
ALT: 13 U/L (ref 0–44)
AST: 11 U/L — ABNORMAL LOW (ref 15–41)
Albumin: 3.7 g/dL (ref 3.5–5.0)
Alkaline Phosphatase: 51 U/L (ref 38–126)
Anion gap: 9 (ref 5–15)
BUN: 11 mg/dL (ref 8–23)
CO2: 29 mmol/L (ref 22–32)
Calcium: 9.3 mg/dL (ref 8.9–10.3)
Chloride: 102 mmol/L (ref 98–111)
Creatinine, Ser: 0.81 mg/dL (ref 0.44–1.00)
GFR, Estimated: >60 mL/min (ref >60)
Glucose, Bld: 154 mg/dL — ABNORMAL HIGH (ref 70–99)
Potassium: 3.5 mmol/L (ref 3.5–5.1)
Sodium: 140 mmol/L (ref 135–145)
Total Bilirubin: 0.7 mg/dL (ref 0.3–1.2)
Total Protein: 7.3 g/dL (ref 6.5–8.1)

## 2020-02-14 LAB — CBC WITH DIFFERENTIAL/PLATELET
Abs Immature Granulocytes: 0.03 10*3/uL (ref 0.00–0.07)
Basophils Absolute: 0 10*3/uL (ref 0.0–0.1)
Basophils Relative: 1 %
Eosinophils Absolute: 0.1 10*3/uL (ref 0.0–0.5)
Eosinophils Relative: 1 %
HCT: 32.3 % — ABNORMAL LOW (ref 36.0–46.0)
Hemoglobin: 10 g/dL — ABNORMAL LOW (ref 12.0–15.0)
Immature Granulocytes: 0 %
Lymphocytes Relative: 25 %
Lymphs Abs: 1.7 10*3/uL (ref 0.7–4.0)
MCH: 31 pg (ref 26.0–34.0)
MCHC: 31 g/dL (ref 30.0–36.0)
MCV: 100 fL (ref 80.0–100.0)
Monocytes Absolute: 0.4 10*3/uL (ref 0.1–1.0)
Monocytes Relative: 6 %
Neutro Abs: 4.5 10*3/uL (ref 1.7–7.7)
Neutrophils Relative %: 67 %
Platelets: 401 10*3/uL — ABNORMAL HIGH (ref 150–400)
RBC: 3.23 MIL/uL — ABNORMAL LOW (ref 3.87–5.11)
RDW: 14.2 % (ref 11.5–15.5)
WBC: 6.7 10*3/uL (ref 4.0–10.5)
nRBC: 0 % (ref 0.0–0.2)

## 2020-02-14 LAB — RESP PANEL BY RT-PCR (RSV, FLU A&B, COVID)  RVPGX2
Influenza A by PCR: NEGATIVE
Influenza B by PCR: NEGATIVE
Resp Syncytial Virus by PCR: NEGATIVE
SARS Coronavirus 2 by RT PCR: NEGATIVE

## 2020-02-14 LAB — D-DIMER, QUANTITATIVE: D-Dimer, Quant: 1.26 ug/mL-FEU — ABNORMAL HIGH (ref 0.00–0.50)

## 2020-02-14 MED ORDER — IOHEXOL 350 MG/ML SOLN
100.0000 mL | Freq: Once | INTRAVENOUS | Status: AC | PRN
  Administered 2020-02-14: 100 mL via INTRAVENOUS

## 2020-02-14 MED ORDER — IPRATROPIUM-ALBUTEROL 20-100 MCG/ACT IN AERS
1.0000 | INHALATION_SPRAY | Freq: Four times a day (QID) | RESPIRATORY_TRACT | Status: DC
  Administered 2020-02-14: 1 via RESPIRATORY_TRACT

## 2020-02-14 NOTE — ED Notes (Signed)
Pt ambulated approximately 40 feet on room air. Sats initially 94% prior to ambulation. 82% on return to bed.

## 2020-02-14 NOTE — ED Provider Notes (Signed)
[facility] EMERGENCY DEPARTMENT Provider Note   CSN: [id number] Arrival date & time: 02/14/20  1215     History Chief Complaint  Patient presents with  . Shortness of Breath    Karen Tate is a 74 y.o. female.  HPI    Patient with significant medical history of stage I right upper lobe adenocarcinoma currently not on radiation, CABG left internal mammary artery to left anterior descending, last echo 08/20 EF of 60 to 65%, carotid stenosis, hypertension, mitral regurg, currently vaccinated for Covid presents to the emergency department with chief complaint of worsening shortness of breath.  Patient states after she got her flu vaccine on the 11/11 she started becoming short of breath, she went to her primary care doctor on the 16th where they gave her Mucinex and started on doxycycline.  She was then seen back to her primary care office today where her PCP noted that her lung sounds on worse on her left side and want her to come to the emergency department to be further evaluated.  Patient endorses that she becomes short of breath on exertion, she denies chest pain, becoming diaphoretic, becoming nauseous or vomiting, paresthesias or weakness the upper lower extremity.  She endorses a slight dry cough but denies any productive sputum, nasal congestion, sore throat, generalized weakness, abdominal pain, nausea, vomiting, diarrhea, constipation.  Patient denies recent leg swelling, leg pain, recent travels but does have a history of [place].  patient denies  relieving factors at this time, she does endorse that she has had a lack of an appetite but this is been chronic for the last couple weeks.  Past Medical History:  Diagnosis Date  . Anxiety neurosis   . Arthritis   . Asthma   . CAD (coronary artery disease)    a. 08/2017: abnormal nuc-> sent directly to [facility], NSTEMI, s/p CABG (left internal mammary artery to left anterior descending, sequential saphenous vein graft to ramus intermedius  branches 1 and 2).  . Carotid artery disease ([place])   . Chronic anemia   . Dizziness   . GERD (gastroesophageal reflux disease)   . Glaucoma   . Heart disease   . Hiatal hernia   . Hyperlipemia   . Hypertension   . Ischemic cardiomyopathy   . Joint pain   . Mitral regurgitation   . Postoperative atrial fibrillation ([place]) 08/2017  . Pulmonary embolus ([place])   . Type 2 diabetes mellitus [facility])     Patient Active Problem List   Diagnosis Date Noted  . Non-small cell cancer of right lung ([place]) 06/29/2019  . Normocytic anemia 06/29/2019  . Postoperative pain 06/23/2019  . S/P lobectomy of lung 05/13/2019  . Coronary artery disease involving native heart without angina pectoris   . Postop check   . S/P CABG x 3 09/18/2017  . Pulmonary embolus ([place]) 09/15/2017  . Type 2 diabetes mellitus ([place]) 09/15/2017  . Non-STEMI (non-ST elevated myocardial infarction) ([place])   . Unstable angina ([place]) 09/12/2017  . AP (angina pectoris) ([place])   . Abnormal stress test   . Chest pain   . 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 BACK SURGERY  2016  . CORONARY ARTERY BYPASS GRAFT N/A 09/18/2017   Procedure: CORONARY ARTERY BYPASS GRAFTING (CABG);  Surgeon: [name], MD;  Location: [place];  Service: Open Heart Surgery;  Laterality: N/A;  Saphenous vein harvest. LIMA to LAD Saphenous vein (sequential) ramus 1 &  2  . EYE SURGERY Bilateral    "surgery for glaucoma"  . HERNIA REPAIR    . INTERCOSTAL NERVE BLOCK Right 05/13/2019   Procedure: Intercostal Nerve Block;  Surgeon: [name], MD;  Location: [place];  Service: Thoracic;  Laterality: Right;  . LEFT HEART CATH AND CORONARY ANGIOGRAPHY N/A 09/15/2017   Procedure: LEFT HEART CATH AND CORONARY ANGIOGRAPHY;  Surgeon: Lorretta Harp, MD;  Location: [place] CV LAB;  Service: Cardiovascular;  Laterality: N/A;  . LYMPH NODE DISSECTION Right 05/13/2019   Procedure:  Lymph Node Dissection;  Surgeon: [name], MD;  Location: [place];  Service: Thoracic;  Laterality: Right;  . ROTATOR CUFF REPAIR Left    x2  . SHOULDER SURGERY     LEFT  . TEE WITHOUT CARDIOVERSION N/A 09/18/2017   Procedure: TRANSESOPHAGEAL ECHOCARDIOGRAM (TEE);  Surgeon: [name], MD;  Location: [place];  Service: Open Heart Surgery;  Laterality: N/A;  . TOTAL KNEE ARTHROPLASTY     LEFT  . VAGINAL HYSTERECTOMY     partial     OB History   No obstetric history on file.     Family History  Problem Relation Age of Onset  . Diabetes Father   . Heart disease Father   . Breast cancer Mother   . Congestive Heart Failure Brother   . Anemia Daughter   . Seizures Daughter   . Migraines Daughter   . Diabetes Daughter   . Hypertension Daughter   . Bladder Cancer Son   . Congestive Heart Failure Son   . Congestive Heart Failure Son     Social History   Tobacco Use  . Smoking status: Former Smoker    Packs/day: 1.25    Years: 16.00    Pack years: 20.00    Types: Cigarettes    Quit date: 03/25/1978    Years since quitting: 41.9  . Smokeless tobacco: Never Used  . Tobacco comment: quit more than 30 years ago  Vaping Use  . Vaping Use: Never used  Substance Use Topics  . Alcohol use: No  . Drug use: No    Home Medications Prior to Admission medications   Medication Sig Start Date End Date Taking? Authorizing Provider  acetaminophen (TYLENOL) 500 MG tablet Take 1,000 mg by mouth every 6 (six) hours as needed for moderate pain.     [provider]  ALPRAZolam Duanne Moron) 0.5 MG tablet Take 0.5 mg by mouth 2 (two) times daily.    [provider]  amLODipine (NORVASC) 10 MG tablet Take 10 mg by mouth daily.    [provider]  aspirin 81 MG tablet Take 1 tablet (81 mg total) by mouth daily. 10/20/17   Nani Skillern, PA-C  atorvastatin (LIPITOR) 80 MG tablet Take 1 tablet (80 mg total) by mouth daily at 6 PM. 11/17/17    Herminio Commons, MD  carvedilol (COREG) 25 MG tablet Take 1 tablet by mouth 2 (two) times daily. 01/14/20   [provider]  ferrous sulfate 325 (65 FE) MG tablet Take 325 mg by mouth 2 (two) times daily.    [provider]  fish oil-omega-3 fatty acids 1000 MG capsule Take 1 g by mouth 3 (three) times daily.     [provider]  furosemide (LASIX) 20 MG tablet Take 20 mg by mouth daily.    [provider]  glipiZIDE (GLUCOTROL) 5 MG tablet Take 5 mg by mouth 2 (two) times daily before  a meal.    [provider]  latanoprost (XALATAN) 0.005 % ophthalmic solution Place 1 drop into both eyes at bedtime. 02/10/19   [provider]  lisinopril (ZESTRIL) 40 MG tablet Take 1 tablet (40 mg total) by mouth daily. 01/31/20 04/30/20  Arnoldo Lenis, MD  magnesium oxide (MAG-OX) 400 MG tablet Take 1 tablet (400 mg total) by mouth 2 (two) times daily. Patient taking differently: Take 400 mg by mouth daily.  10/10/17   Dunn, Nedra Hai, PA-C  meclizine (ANTIVERT) 25 MG tablet Take 25 mg by mouth 3 (three) times daily as needed for dizziness.     [provider]  metFORMIN (GLUCOPHAGE) 1000 MG tablet Take 1,000 mg by mouth daily.     [provider]  Multiple Vitamins-Minerals (CENTRUM SILVER 50+WOMEN) TABS Take 1 tablet by mouth daily.     [provider]  omeprazole (PRILOSEC) 40 MG capsule Take 40 mg by mouth daily.    [provider]  potassium chloride SA (KLOR-CON) 20 MEQ tablet TAKE 2 TABLETS DAILY FOR 3 DAYS 01/31/20   Branch, Alphonse Guild, MD  tiZANidine (ZANAFLEX) 4 MG tablet Take 1 tablet (4 mg total) by mouth every 6 (six) hours as needed for muscle spasms. 10/02/14   Ashok Pall, MD  vitamin B-12 (CYANOCOBALAMIN) 500 MCG tablet Take 500 mcg by mouth daily.    [provider]    Allergies    Beta adrenergic blockers, Calcium channel blockers, and Naproxen  Review of Systems   Review of Systems    Constitutional: Positive for activity change and appetite change. Negative for chills and fever.  HENT: Negative for congestion and trouble swallowing.   Respiratory: Positive for cough and shortness of breath.   Cardiovascular: Negative for chest pain, palpitations and leg swelling.  Gastrointestinal: Negative for abdominal pain, diarrhea, nausea and vomiting.  Genitourinary: Negative for dysuria, enuresis and flank pain.  Musculoskeletal: Negative for back pain.  Skin: Negative for rash.  Neurological: Positive for weakness and headaches. Negative for dizziness.  Hematological: Does not bruise/bleed easily.    Physical Exam Updated Vital Signs BP (!) 149/55   Pulse 66   Temp 98 F (36.7 C) (Oral)   Resp 14   Ht 5\' 5"  (1.651 m)   Wt 80.7 kg   SpO2 100%   BMI 29.62 kg/m   Physical Exam Vitals and nursing note reviewed.  Constitutional:      General: She is not in acute distress.    Appearance: She is not ill-appearing.  HENT:     Head: Normocephalic and atraumatic.     Nose: No congestion.     Mouth/Throat:     Mouth: Mucous membranes are moist.     Pharynx: Oropharynx is clear. No oropharyngeal exudate or posterior oropharyngeal erythema.  Eyes:     Conjunctiva/sclera: Conjunctivae normal.  Cardiovascular:     Rate and Rhythm: Normal rate and regular rhythm.     Pulses: Normal pulses.     Heart sounds: No murmur heard.  No friction rub. No gallop.   Pulmonary:     Effort: No respiratory distress.     Breath sounds: No wheezing, rhonchi or rales.     Comments: Patient's lung sounds were evaluated she had chest tightness bilaterally, and infrequent wheezing heard on left upper lobe no rales or rhonchi notable. Abdominal:     Palpations: Abdomen is soft.     Tenderness: There is no abdominal tenderness. There is no guarding.  Musculoskeletal:  Comments: Patient is moving all 4 extremities without any difficulty.  Skin:    General: Skin is warm and dry.   Neurological:     General: No focal deficit present.     Mental Status: She is alert.     Comments: Patient no difficulty with word finding  Psychiatric:        Mood and Affect: Mood normal.     ED Results / Procedures / Treatments   Labs (all labs ordered are listed, but only abnormal results are displayed) Labs Reviewed  COMPREHENSIVE METABOLIC PANEL - Abnormal; Notable for the following components:      Result Value   Glucose, Bld 154 (*)    AST 11 (*)    All other components within normal limits  CBC WITH DIFFERENTIAL/PLATELET - Abnormal; Notable for the following components:   RBC 3.23 (*)    Hemoglobin 10.0 (*)    HCT 32.3 (*)    Platelets 401 (*)    All other components within normal limits  D-DIMER, QUANTITATIVE (NOT AT [facility]) - Abnormal; Notable for the following components:   D-Dimer, Quant 1.26 (*)    All other components within normal limits  URINALYSIS, ROUTINE W REFLEX MICROSCOPIC - Abnormal; Notable for the following components:   Color, Urine STRAW (*)    All other components within normal limits  RESP PANEL BY RT-PCR (RSV, FLU A&B, COVID)  [id number]    EKG EKG Interpretation  Date/Time:  Monday February 14 2020 12:29:48 EST Ventricular Rate:  78 PR Interval:  174 QRS Duration: 80 QT Interval:  380 QTC Calculation: 433 R Axis:   72 Text Interpretation: Normal sinus rhythm Nonspecific T wave abnormality Abnormal ECG Confirmed by Fredia Sorrow [phone]) on 02/14/2020 5:25:43 PM   Radiology CT Angio Chest PE W and/or Wo Contrast  Result Date: 02/14/2020 CLINICAL DATA:  Shortness of breath. EXAM: CT ANGIOGRAPHY CHEST WITH CONTRAST TECHNIQUE: Multidetector CT imaging of the chest was performed using the standard protocol during bolus administration of intravenous contrast. Multiplanar CT image reconstructions and MIPs were obtained to evaluate the vascular anatomy. CONTRAST:  166mL OMNIPAQUE IOHEXOL 350 MG/ML SOLN COMPARISON:  October 27, 2019. FINDINGS:  Cardiovascular: Satisfactory opacification of the pulmonary arteries to the segmental level. No evidence of pulmonary embolism. Mild cardiomegaly is noted. Status post coronary artery bypass graft. Atherosclerosis of thoracic aorta is noted without aneurysm or dissection. No pericardial effusion. Mediastinum/Nodes: Small sliding-type hiatal hernia is noted. No adenopathy is noted. Thyroid gland is unremarkable. Lungs/Pleura: No pneumothorax is noted. Small bilateral pleural effusions are noted with adjacent subsegmental atelectasis. Status post right upper lobectomy. Upper Abdomen: No acute abnormality. Musculoskeletal: No chest wall abnormality. No acute or significant osseous findings. Review of the MIP images confirms the above findings. IMPRESSION: 1. No definite evidence of pulmonary embolus. 2. Small bilateral pleural effusions are noted with adjacent subsegmental atelectasis. 3. Status post right upper lobectomy. 4. Small sliding-type hiatal hernia. 5. Aortic atherosclerosis. Aortic Atherosclerosis (ICD10-I70.0). Electronically Signed   By: Marijo Conception M.D.   On: 02/14/2020 16:37   DG Chest Port 1 View  Result Date: 02/14/2020 CLINICAL DATA:  Cough and wheezing EXAM: PORTABLE CHEST 1 VIEW COMPARISON:  09/07/2019 FINDINGS: Cardiac shadow is mildly prominent accentuated by the portable technique. Postsurgical changes are noted and stable. Elevation of the right hemidiaphragm is seen. Small right pleural effusion is noted. No focal infiltrate is seen. IMPRESSION: Small right pleural effusion Electronically Signed   By: Linus Mako.D.  On: 02/14/2020 14:18    Procedures Procedures (including critical care time)  Medications Ordered in ED Medications  Ipratropium-Albuterol (COMBIVENT) respimat 1 puff (1 puff Inhalation Given 02/14/20 1448)  iohexol (OMNIPAQUE) 350 MG/ML injection 100 mL (100 mLs Intravenous Contrast Given 02/14/20 1603)    ED Course  I have reviewed the triage vital signs  and the nursing notes.  Pertinent labs & imaging results that were available during my care of the patient were reviewed by me and considered in my medical decision making (see chart for details).    MDM Rules/Calculators/A&P                          Presents with worsening shortness of breath, currently on 2 L via nasal cannula, vital signs reassuring.  Will obtain screening labs, EKG, chest x-ray for further evaluation.  Will provide patient with Combivent for shortness of breath and reevaluate  Patient was reevaluated after providing her with Combivent and states she is feeling much better as if she can breathe a little bit better.  Lung sounds had more airflow in comparison to first time she was evaluated. ambulated the patient while on 2 L nasal cannula when she stay at 100%.  CBC shows macrocytic anemia appears to be at baseline.  Positive D-dimer 1.26, will proceed with CT angio for further evaluation.  CMP negative for electrolyte abnormalities, no metabolic acidosis, hyperglycemia of 154, no anion gap present chest x-ray shows slight pleural effusion noted in the right lower lung.  Respiratory panel negative for Covid, influenza A/B, RSV.  EKG shows normal sinus without signs of ischemia no ST elevation or depression noted, nonspecific T wave morphology CT angio was negative for evidence of a PE, small bilateral pleural effusions are noted, post right upper lobectomy, small sliding-type hiatal hernia.   I have low suspicion for ACS as history is atypical, EKG was sinus rhythm without signs of ischemia, no signs of hypoperfusion or noted pedal edema.  Low suspicion for PE as CT angio was negative for acute PE.  Low suspicion for pneumonia, collapsed lung as patient had negative respiratory panel, no acute findings noted on CT angio.  Low suspicion for AAA or aortic dissection as history is atypical.  Low suspicion for systemic infection as patient is nontoxic-appearing, vital signs reassuring,  no obvious source infection noted on exam.  I suspect patient suffering from a viral URI as symptoms started after she obtained her flu vaccine in her lungs improved with DuoNeb.  Will encourage patient stays on her home oxygen, continue with duo nebs and follows up with her oncologist for further evaluation.  Vital signs have remained stable, no indication for hospital admission.  Patient discussed with attending and they agreed with assessment and plan.  Patient given at home care as well strict return precautions.  Patient verbalized that they understood agreed to said plan.   Final Clinical Impression(s) / ED Diagnoses Final diagnoses:  Viral upper respiratory tract infection    Rx / DC Orders ED Discharge Orders    None       Marcello Fennel, PA-C 02/14/20 1823    Pattricia Boss, MD 02/15/20 1236

## 2020-02-14 NOTE — ED Triage Notes (Signed)
Pt reports she had her flu shot on 11/16. The next day pt c/o no energy and feeling weak, then SOB. Pt was given antibiotic and Mucinex on 11/16 by PCP. Pt went back today to PCP due to not feeling any better and due to pt's wheezing and rhonchi pt was sent to ED.

## 2020-02-14 NOTE — Discharge Instructions (Addendum)
Seen here for shortness of breath, lab work and imaging looks reassuring.  I have given you a inhaler please use every 4-6 hours as needed for wheezing or shortness of breath.  I want you to follow-up with your cancer doctor for further evaluation.  Come back to the emergency department if you develop chest pain, shortness of breath, severe abdominal pain, uncontrolled nausea, vomiting, diarrhea.

## 2020-02-16 ENCOUNTER — Telehealth: Payer: Self-pay | Admitting: *Deleted

## 2020-02-16 NOTE — Telephone Encounter (Signed)
LM to return call.

## 2020-02-16 NOTE — Telephone Encounter (Signed)
-----   Message from Arnoldo Lenis, MD sent at 02/15/2020  1:58 PM EST ----- Carotid US shows mild to moderate blockages on both sides, continue to monitor at this time with repeat study in 1 year   Zandra Abts MD

## 2020-02-18 DIAGNOSIS — E1121 Type 2 diabetes mellitus with diabetic nephropathy: Secondary | ICD-10-CM | POA: Diagnosis not present

## 2020-02-18 DIAGNOSIS — I10 Essential (primary) hypertension: Secondary | ICD-10-CM | POA: Diagnosis not present

## 2020-02-21 ENCOUNTER — Inpatient Hospital Stay (HOSPITAL_COMMUNITY): Payer: Medicare Other

## 2020-02-21 ENCOUNTER — Other Ambulatory Visit: Payer: Self-pay

## 2020-02-21 VITALS — BP 143/54 | HR 62 | Temp 97.2°F | Resp 18

## 2020-02-21 DIAGNOSIS — I10 Essential (primary) hypertension: Secondary | ICD-10-CM | POA: Diagnosis not present

## 2020-02-21 DIAGNOSIS — D649 Anemia, unspecified: Secondary | ICD-10-CM

## 2020-02-21 DIAGNOSIS — E119 Type 2 diabetes mellitus without complications: Secondary | ICD-10-CM | POA: Diagnosis not present

## 2020-02-21 DIAGNOSIS — E538 Deficiency of other specified B group vitamins: Secondary | ICD-10-CM | POA: Diagnosis not present

## 2020-02-21 DIAGNOSIS — Z87891 Personal history of nicotine dependence: Secondary | ICD-10-CM | POA: Diagnosis not present

## 2020-02-21 DIAGNOSIS — C3411 Malignant neoplasm of upper lobe, right bronchus or lung: Secondary | ICD-10-CM | POA: Diagnosis not present

## 2020-02-21 MED ORDER — SODIUM CHLORIDE 0.9 % IV SOLN: INTRAVENOUS | Status: DC

## 2020-02-21 MED ORDER — SODIUM CHLORIDE 0.9 % IV SOLN
510.0000 mg | Freq: Once | INTRAVENOUS | Status: AC
  Administered 2020-02-21: 510 mg via INTRAVENOUS
  Filled 2020-02-21: qty 510

## 2020-02-21 NOTE — Progress Notes (Signed)
Karen Tate presents today for IV iron infusion. Last infusion received in November, 2021 without difficulties. Infusion tolerated without incident or complaint. See MAR for details. VSS prior to and post infusion. Patient observed for 30 minutes post infusion and was discharged in satisfactory condition with follow up instructions.

## 2020-02-21 NOTE — Telephone Encounter (Signed)
Pt voiced understanding - recall placed for 1 year repeat study

## 2020-02-21 NOTE — Patient Instructions (Signed)
Karen Tate at [facility]  Discharge Instructions:  Ferumoxytol injection What is this medicine? FERUMOXYTOL is an iron complex. Iron is used to make healthy red blood cells, which carry oxygen and nutrients throughout the body. This medicine is used to treat iron deficiency anemia. This medicine may be used for other purposes; ask your health care provider or pharmacist if you have questions. COMMON BRAND NAME(S): Feraheme What should I tell my health care provider before I take this medicine? They need to know if you have any of these conditions:  anemia not caused by low iron levels  high levels of iron in the blood  magnetic resonance imaging (MRI) test scheduled  an unusual or allergic reaction to iron, other medicines, foods, dyes, or preservatives  pregnant or trying to get pregnant  breast-feeding How should I use this medicine? This medicine is for injection into a vein. It is given by a health care professional in a hospital or clinic setting. Talk to your pediatrician regarding the use of this medicine in children. Special care may be needed. Overdosage: If you think you have taken too much of this medicine contact a poison control center or emergency room at once. NOTE: This medicine is only for you. Do not share this medicine with others. What if I miss a dose? It is important not to miss your dose. Call your doctor or health care professional if you are unable to keep an appointment. What may interact with this medicine? This medicine may interact with the following medications:  other iron products This list may not describe all possible interactions. Give your health care provider a list of all the medicines, herbs, non-prescription drugs, or dietary supplements you use. Also tell them if you smoke, drink alcohol, or use illegal drugs. Some items may interact with your medicine. What should I watch for while using this medicine? Visit your  doctor or healthcare professional regularly. Tell your doctor or healthcare professional if your symptoms do not start to get better or if they get worse. You may need blood work done while you are taking this medicine. You may need to follow a special diet. Talk to your doctor. Foods that contain iron include: whole grains/cereals, dried fruits, beans, or peas, leafy green vegetables, and organ meats (liver, kidney). What side effects may I notice from receiving this medicine? Side effects that you should report to your doctor or health care professional as soon as possible:  allergic reactions like skin rash, itching or hives, swelling of the face, lips, or tongue  breathing problems  changes in blood pressure  feeling faint or lightheaded, falls  fever or chills  flushing, sweating, or hot feelings  swelling of the ankles or feet Side effects that usually do not require medical attention (report to your doctor or health care professional if they continue or are bothersome):  diarrhea  headache  nausea, vomiting  stomach pain This list may not describe all possible side effects. Call your doctor for medical advice about side effects. You may report side effects to FDA at 1-800-FDA-1088. Where should I keep my medicine? This drug is given in a hospital or clinic and will not be stored at home. NOTE: This sheet is a summary. It may not cover all possible information. If you have questions about this medicine, talk to your doctor, pharmacist, or health care provider.  2020 Elsevier/Gold Standard (2016-04-29 20:21:10)  _______________________________________________________________  Thank you for choosing [place] at  [facility] to provide your oncology and hematology care.  To afford each patient quality time with our providers, please arrive at least 15 minutes before your scheduled appointment.  You need to re-schedule your appointment if you arrive 10 or  more minutes late.  We strive to give you quality time with our providers, and arriving late affects you and other patients whose appointments are after yours.  Also, if you no show three or more times for appointments you may be dismissed from the clinic.  Again, thank you for choosing [place] at [place] hope is that these requests will allow you access to exceptional care and in a timely manner. _______________________________________________________________  If you have questions after your visit, please contact our office at [phone] between the hours of 8:30 a.m. and 5:00 p.m. Voicemails left after 4:30 p.m. will not be returned until the following business day. _______________________________________________________________  For prescription refill requests, have your pharmacy contact our office. _______________________________________________________________  Recommendations made by the consultant and any test results will be sent to your referring physician. _______________________________________________________________

## 2020-03-03 DIAGNOSIS — E1121 Type 2 diabetes mellitus with diabetic nephropathy: Secondary | ICD-10-CM | POA: Diagnosis not present

## 2020-03-03 DIAGNOSIS — I10 Essential (primary) hypertension: Secondary | ICD-10-CM | POA: Diagnosis not present

## 2020-03-06 ENCOUNTER — Ambulatory Visit: Payer: Medicare Other | Admitting: Cardiovascular Disease

## 2020-03-06 DIAGNOSIS — J441 Chronic obstructive pulmonary disease with (acute) exacerbation: Secondary | ICD-10-CM | POA: Diagnosis not present

## 2020-03-06 DIAGNOSIS — J219 Acute bronchiolitis, unspecified: Secondary | ICD-10-CM | POA: Diagnosis not present

## 2020-03-15 ENCOUNTER — Ambulatory Visit: Payer: Medicare Other | Admitting: Cardiology

## 2020-03-15 DIAGNOSIS — E119 Type 2 diabetes mellitus without complications: Secondary | ICD-10-CM | POA: Diagnosis not present

## 2020-03-15 DIAGNOSIS — Z7984 Long term (current) use of oral hypoglycemic drugs: Secondary | ICD-10-CM | POA: Diagnosis not present

## 2020-03-15 DIAGNOSIS — Z961 Presence of intraocular lens: Secondary | ICD-10-CM | POA: Diagnosis not present

## 2020-03-15 DIAGNOSIS — H401131 Primary open-angle glaucoma, bilateral, mild stage: Secondary | ICD-10-CM | POA: Diagnosis not present

## 2020-03-15 DIAGNOSIS — H524 Presbyopia: Secondary | ICD-10-CM | POA: Diagnosis not present

## 2020-03-29 DIAGNOSIS — M545 Low back pain, unspecified: Secondary | ICD-10-CM | POA: Diagnosis not present

## 2020-03-31 DIAGNOSIS — R531 Weakness: Secondary | ICD-10-CM | POA: Diagnosis not present

## 2020-04-06 DIAGNOSIS — Z6830 Body mass index (BMI) 30.0-30.9, adult: Secondary | ICD-10-CM | POA: Diagnosis not present

## 2020-04-06 DIAGNOSIS — I10 Essential (primary) hypertension: Secondary | ICD-10-CM | POA: Diagnosis not present

## 2020-04-06 DIAGNOSIS — J219 Acute bronchiolitis, unspecified: Secondary | ICD-10-CM | POA: Diagnosis not present

## 2020-04-06 DIAGNOSIS — E1143 Type 2 diabetes mellitus with diabetic autonomic (poly)neuropathy: Secondary | ICD-10-CM | POA: Diagnosis not present

## 2020-04-06 DIAGNOSIS — I5022 Chronic systolic (congestive) heart failure: Secondary | ICD-10-CM | POA: Diagnosis not present

## 2020-04-06 DIAGNOSIS — J449 Chronic obstructive pulmonary disease, unspecified: Secondary | ICD-10-CM | POA: Diagnosis not present

## 2020-04-06 DIAGNOSIS — E7849 Other hyperlipidemia: Secondary | ICD-10-CM | POA: Diagnosis not present

## 2020-04-15 DIAGNOSIS — U071 COVID-19: Secondary | ICD-10-CM | POA: Diagnosis not present

## 2020-05-01 DIAGNOSIS — R531 Weakness: Secondary | ICD-10-CM | POA: Diagnosis not present

## 2020-05-04 ENCOUNTER — Other Ambulatory Visit: Payer: Self-pay

## 2020-05-04 ENCOUNTER — Inpatient Hospital Stay (HOSPITAL_COMMUNITY): Payer: Medicare HMO | Attending: Hematology

## 2020-05-04 ENCOUNTER — Ambulatory Visit (HOSPITAL_COMMUNITY)
Admission: RE | Admit: 2020-05-04 | Discharge: 2020-05-04 | Disposition: A | Discharge status: Home / Self Care | Payer: Medicare HMO | Source: Ambulatory Visit | Attending: Hematology | Admitting: Hematology

## 2020-05-04 DIAGNOSIS — Z87891 Personal history of nicotine dependence: Secondary | ICD-10-CM | POA: Diagnosis not present

## 2020-05-04 DIAGNOSIS — C3491 Malignant neoplasm of unspecified part of right bronchus or lung: Secondary | ICD-10-CM

## 2020-05-04 DIAGNOSIS — E538 Deficiency of other specified B group vitamins: Secondary | ICD-10-CM | POA: Diagnosis not present

## 2020-05-04 DIAGNOSIS — D649 Anemia, unspecified: Secondary | ICD-10-CM | POA: Insufficient documentation

## 2020-05-04 DIAGNOSIS — N189 Chronic kidney disease, unspecified: Secondary | ICD-10-CM | POA: Diagnosis not present

## 2020-05-04 DIAGNOSIS — M545 Low back pain, unspecified: Secondary | ICD-10-CM | POA: Insufficient documentation

## 2020-05-04 DIAGNOSIS — J90 Pleural effusion, not elsewhere classified: Secondary | ICD-10-CM | POA: Diagnosis not present

## 2020-05-04 DIAGNOSIS — Z902 Acquired absence of lung [part of]: Secondary | ICD-10-CM | POA: Insufficient documentation

## 2020-05-04 DIAGNOSIS — Z96652 Presence of left artificial knee joint: Secondary | ICD-10-CM | POA: Insufficient documentation

## 2020-05-04 DIAGNOSIS — Z85118 Personal history of other malignant neoplasm of bronchus and lung: Secondary | ICD-10-CM | POA: Insufficient documentation

## 2020-05-04 DIAGNOSIS — Z951 Presence of aortocoronary bypass graft: Secondary | ICD-10-CM | POA: Diagnosis not present

## 2020-05-04 DIAGNOSIS — M79605 Pain in left leg: Secondary | ICD-10-CM | POA: Insufficient documentation

## 2020-05-04 DIAGNOSIS — R911 Solitary pulmonary nodule: Secondary | ICD-10-CM | POA: Diagnosis not present

## 2020-05-04 LAB — CBC WITH DIFFERENTIAL/PLATELET
Abs Immature Granulocytes: 0.01 10*3/uL (ref 0.00–0.07)
Basophils Absolute: 0 10*3/uL (ref 0.0–0.1)
Basophils Relative: 1 %
Eosinophils Absolute: 0.1 10*3/uL (ref 0.0–0.5)
Eosinophils Relative: 2 %
HCT: 38.6 % (ref 36.0–46.0)
Hemoglobin: 12 g/dL (ref 12.0–15.0)
Immature Granulocytes: 0 %
Lymphocytes Relative: 44 %
Lymphs Abs: 2 10*3/uL (ref 0.7–4.0)
MCH: 30.3 pg (ref 26.0–34.0)
MCHC: 31.1 g/dL (ref 30.0–36.0)
MCV: 97.5 fL (ref 80.0–100.0)
Monocytes Absolute: 0.4 10*3/uL (ref 0.1–1.0)
Monocytes Relative: 8 %
Neutro Abs: 2.1 10*3/uL (ref 1.7–7.7)
Neutrophils Relative %: 45 %
Platelets: 218 10*3/uL (ref 150–400)
RBC: 3.96 MIL/uL (ref 3.87–5.11)
RDW: 15.9 % — ABNORMAL HIGH (ref 11.5–15.5)
WBC: 4.6 10*3/uL (ref 4.0–10.5)
nRBC: 0 % (ref 0.0–0.2)

## 2020-05-04 LAB — IRON AND TIBC
Iron: 57 ug/dL (ref 28–170)
Saturation Ratios: 20 % (ref 10.4–31.8)
TIBC: 292 ug/dL (ref 250–450)
UIBC: 235 ug/dL

## 2020-05-04 LAB — VITAMIN B12: Vitamin B-12: 540 pg/mL (ref 180–914)

## 2020-05-04 LAB — POCT I-STAT CREATININE: Creatinine, Ser: 0.9 mg/dL (ref 0.44–1.00)

## 2020-05-04 LAB — FERRITIN: Ferritin: 123 ng/mL (ref 11–307)

## 2020-05-04 MED ORDER — IOHEXOL 300 MG/ML  SOLN
75.0000 mL | Freq: Once | INTRAMUSCULAR | Status: AC | PRN
  Administered 2020-05-04: 75 mL via INTRAVENOUS

## 2020-05-08 ENCOUNTER — Encounter (HOSPITAL_COMMUNITY): Payer: Self-pay | Admitting: Hematology

## 2020-05-08 ENCOUNTER — Other Ambulatory Visit: Payer: Self-pay | Admitting: Thoracic Surgery (Cardiothoracic Vascular Surgery)

## 2020-05-08 ENCOUNTER — Inpatient Hospital Stay (HOSPITAL_COMMUNITY): Payer: Medicare HMO | Admitting: Hematology

## 2020-05-08 ENCOUNTER — Other Ambulatory Visit: Payer: Self-pay

## 2020-05-08 VITALS — BP 154/52 | HR 66 | Temp 97.4°F | Resp 18 | Wt 181.0 lb

## 2020-05-08 DIAGNOSIS — Z87891 Personal history of nicotine dependence: Secondary | ICD-10-CM | POA: Diagnosis not present

## 2020-05-08 DIAGNOSIS — N189 Chronic kidney disease, unspecified: Secondary | ICD-10-CM | POA: Diagnosis not present

## 2020-05-08 DIAGNOSIS — C3491 Malignant neoplasm of unspecified part of right bronchus or lung: Secondary | ICD-10-CM

## 2020-05-08 DIAGNOSIS — M545 Low back pain, unspecified: Secondary | ICD-10-CM | POA: Diagnosis not present

## 2020-05-08 DIAGNOSIS — M79605 Pain in left leg: Secondary | ICD-10-CM | POA: Diagnosis not present

## 2020-05-08 DIAGNOSIS — E538 Deficiency of other specified B group vitamins: Secondary | ICD-10-CM | POA: Diagnosis not present

## 2020-05-08 DIAGNOSIS — Z951 Presence of aortocoronary bypass graft: Secondary | ICD-10-CM

## 2020-05-08 DIAGNOSIS — Z85118 Personal history of other malignant neoplasm of bronchus and lung: Secondary | ICD-10-CM | POA: Diagnosis not present

## 2020-05-08 DIAGNOSIS — Z96652 Presence of left artificial knee joint: Secondary | ICD-10-CM | POA: Diagnosis not present

## 2020-05-08 DIAGNOSIS — Z902 Acquired absence of lung [part of]: Secondary | ICD-10-CM | POA: Diagnosis not present

## 2020-05-08 DIAGNOSIS — D649 Anemia, unspecified: Secondary | ICD-10-CM | POA: Diagnosis not present

## 2020-05-08 NOTE — Progress Notes (Signed)
Karen Tate, [place] [address]   CLINIC:  Medical Oncology/Hematology  PCP:  Karen Burly, MD [place] / [place] [id number] [phone]   REASON FOR VISIT:  Follow-up for stage I right lung adenocarcinoma and IDA  PRIOR THERAPY: Right upper lobectomy on 05/13/2019  NGS Results: Not done  CURRENT THERAPY: Intermittent Feraheme last on 02/21/2020  BRIEF ONCOLOGIC HISTORY:  Oncology History  Non-small cell cancer of right lung ([place])  06/29/2019 Initial Diagnosis   Non-small cell cancer of right lung ([place])   06/29/2019 Cancer Staging   Staging form: Lung, AJCC 8th Edition - Clinical stage from 06/29/2019: Stage IA2 (cT1b, cN0, cM0) - Signed by Karen Jack, MD on 06/29/2019     CANCER STAGING: Cancer Staging Non-small cell cancer of right lung [facility]) Staging form: Lung, AJCC 8th Edition - Clinical stage from 06/29/2019: Stage IA2 (cT1b, cN0, cM0) - Signed by Karen Jack, MD on 06/29/2019   INTERVAL HISTORY:  [name], a 75 y.o. female, returns for routine follow-up of her stage I right lung adenocarcinoma. Karen Tate was last seen on 02/02/2020.   Today she is accompanied by her husband and she reports feeling well. She received her Feraheme and felt better afterwards. She complains of ongoing pain in her lower back and left leg since having her knee replacement and has occasional ankle swelling. She denies having any abdominal pain.  She is having her follow-up with the ortho surgeon, Dr. Koleen Tate, on 02/15.   REVIEW OF SYSTEMS:  Review of Systems  Constitutional: Positive for fatigue (75%). Negative for appetite change.  Respiratory: Positive for shortness of breath (w/ exertion).   Cardiovascular: Positive for leg swelling (feet swelling; occasional).  Gastrointestinal: Negative for abdominal pain.  Musculoskeletal: Positive for arthralgias (L leg pain) and back pain (9/10 lower back radiating down L leg).   Neurological: Positive for numbness (feet).  All other systems reviewed and are negative.   PAST MEDICAL/SURGICAL HISTORY:  Past Medical History:  Diagnosis Date  . Anxiety neurosis   . Arthritis   . Asthma   . CAD (coronary artery disease)    a. 08/2017: abnormal nuc-> sent directly to [facility], NSTEMI, s/p CABG (left internal mammary artery to left anterior descending, sequential saphenous vein graft to ramus intermedius branches 1 and 2).  . Carotid artery disease ([place])   . Chronic anemia   . Dizziness   . GERD (gastroesophageal reflux disease)   . Glaucoma   . Heart disease   . Hiatal hernia   . Hyperlipemia   . Hypertension   . Ischemic cardiomyopathy   . Joint pain   . Mitral regurgitation   . Postoperative atrial fibrillation ([place]) 08/2017  . Pulmonary embolus ([place])   . Type 2 diabetes mellitus ([place])    Past Surgical History:  Procedure Laterality Date  . BACK SURGERY  2016  . CORONARY ARTERY BYPASS GRAFT N/A 09/18/2017   Procedure: CORONARY ARTERY BYPASS GRAFTING (CABG);  Surgeon: Karen Nakayama, MD;  Location: [place];  Service: Open Heart Surgery;  Laterality: N/A;  Saphenous vein harvest. LIMA to LAD Saphenous vein (sequential) ramus 1 & 2  . EYE SURGERY Bilateral    "surgery for glaucoma"  . HERNIA REPAIR    . INTERCOSTAL NERVE BLOCK Right 05/13/2019   Procedure: Intercostal Nerve Block;  Surgeon: Karen Nakayama, MD;  Location: [place];  Service: Thoracic;  Laterality: Right;  . LEFT HEART CATH AND  CORONARY ANGIOGRAPHY N/A 09/15/2017   Procedure: LEFT HEART CATH AND CORONARY ANGIOGRAPHY;  Surgeon: Lorretta Harp, MD;  Location: [place] [facility];  Service: Cardiovascular;  Laterality: N/A;  . LYMPH NODE DISSECTION Right 05/13/2019   Procedure: Lymph Node Dissection;  Surgeon: Karen Nakayama, MD;  Location: [place];  Service: Thoracic;  Laterality: Right;  . ROTATOR CUFF REPAIR Left    x2  . SHOULDER SURGERY     LEFT  . TEE WITHOUT CARDIOVERSION  N/A 09/18/2017   Procedure: TRANSESOPHAGEAL ECHOCARDIOGRAM (TEE);  Surgeon: Karen Nakayama, MD;  Location: [place];  Service: Open Heart Surgery;  Laterality: N/A;  . TOTAL KNEE ARTHROPLASTY     LEFT  . VAGINAL HYSTERECTOMY     partial    SOCIAL HISTORY:  Social History   Socioeconomic History  . Marital status: Married    Spouse name: [name]  . Number of children: 5  . Years of education: Not on file  . Highest education level: Not on file  Occupational History  . Not on file  Tobacco Use  . Smoking status: Former Smoker    Packs/day: 1.25    Years: 16.00    Pack years: 20.00    Types: Cigarettes    Quit date: 03/25/1978    Years since quitting: 42.1  . Smokeless tobacco: Never Used  . Tobacco comment: quit more than 30 years ago  Vaping Use  . Vaping Use: Never used  Substance and Sexual Activity  . Alcohol use: No  . Drug use: No  . Sexual activity: Not on file  Other Topics Concern  . Not on file  Social History Narrative  . Not on file   Social Determinants of Health   Financial Resource Strain: Low Risk   . Difficulty of Paying Living Expenses: Not hard at all  Food Insecurity: No Food Insecurity  . Worried About Charity fundraiser in the Last Year: Never true  . Ran Out of Food in the Last Year: Never true  Transportation Needs: No Transportation Needs  . Lack of Transportation (Medical): No  . Lack of Transportation (Non-Medical): No  Physical Activity: Inactive  . Days of Exercise per Week: 0 days  . Minutes of Exercise per Session: 0 min  Stress: No Stress Concern Present  . Feeling of Stress : Not at all  Social Connections: Moderately Integrated  . Frequency of Communication with Friends and Family: More than three times a week  . Frequency of Social Gatherings with Friends and Family: More than three times a week  . Attends Religious Services: More than 4 times per year  . Active Member of Clubs or Organizations: No  . Attends Theatre manager Meetings: Never  . Marital Status: Married  Human resources officer Violence: Not At Risk  . Fear of Current or Ex-Partner: No  . Emotionally Abused: No  . Physically Abused: No  . Sexually Abused: No    FAMILY HISTORY:  Family History  Problem Relation Age of Onset  . Diabetes Father   . Heart disease Father   . Breast cancer Mother   . Congestive Heart Failure Brother   . Anemia Daughter   . Seizures Daughter   . Migraines Daughter   . Diabetes Daughter   . Hypertension Daughter   . Bladder Cancer Son   . Congestive Heart Failure Son   . Congestive Heart Failure Son     CURRENT MEDICATIONS:  Current Outpatient Medications  Medication Sig  Dispense Refill  . acetaminophen (TYLENOL) 500 MG tablet Take 1,000 mg by mouth every 6 (six) hours as needed for moderate pain.     Mar[place] Kitchen ALPRAZolam (XANAX) 0.5 MG tablet Take 0.5 mg by mouth 2 (two) times daily.    Marland Kitchen amLODipine (NORVASC) 10 MG tablet Take 10 mg by mouth daily.    Marland Kitchen aspirin 81 MG tablet Take 1 tablet (81 mg total) by mouth daily.    Marland Kitchen atorvastatin (LIPITOR) 80 MG tablet Take 1 tablet (80 mg total) by mouth daily at 6 PM. 90 tablet 3  . carvedilol (COREG) 25 MG tablet Take 1 tablet by mouth 2 (two) times daily.    Marland Kitchen doxycycline (VIBRA-TABS) 100 MG tablet Take 100 mg by mouth 2 (two) times daily.    . ferrous sulfate 325 (65 FE) MG tablet Take 325 mg by mouth 2 (two) times daily.    . fish oil-omega-3 fatty acids 1000 MG capsule Take 1 g by mouth 3 (three) times daily.     . furosemide (LASIX) 20 MG tablet Take 20 mg by mouth daily.    Marland Kitchen glipiZIDE (GLUCOTROL) 5 MG tablet Take 5 mg by mouth 2 (two) times daily before a meal.    . ketoconazole (NIZORAL) 2 % cream SMARTSIG:1 Topical Every Night    . latanoprost (XALATAN) 0.005 % ophthalmic solution Place 1 drop into both eyes at bedtime.    Marland Kitchen lisinopril (ZESTRIL) 40 MG tablet Take 1 tablet (40 mg total) by mouth daily. 90 tablet 1  . magnesium oxide (MAG-OX) 400 (241.3 Mg)  MG tablet Take 1 tablet by mouth 2 (two) times daily.    . magnesium oxide (MAG-OX) 400 MG tablet Take 1 tablet (400 mg total) by mouth 2 (two) times daily. (Patient taking differently: Take 400 mg by mouth daily. ) 180 tablet 3  . meclizine (ANTIVERT) 25 MG tablet Take 25 mg by mouth 3 (three) times daily as needed for dizziness.     . metFORMIN (GLUCOPHAGE) 1000 MG tablet Take 1,000 mg by mouth daily.     . Multiple Vitamins-Minerals (CENTRUM SILVER 50+WOMEN) TABS Take 1 tablet by mouth daily.     Marland Kitchen omeprazole (PRILOSEC) 40 MG capsule Take 40 mg by mouth daily.    . potassium chloride SA (KLOR-CON) 20 MEQ tablet TAKE 2 TABLETS DAILY FOR 3 DAYS 6 tablet 0  . tiZANidine (ZANAFLEX) 4 MG tablet Take 1 tablet (4 mg total) by mouth every 6 (six) hours as needed for muscle spasms. 60 tablet 0  . vitamin B-12 (CYANOCOBALAMIN) 500 MCG tablet Take 500 mcg by mouth daily.     No current facility-administered medications for this visit.    ALLERGIES:  Allergies  Allergen Reactions  . Beta Adrenergic Blockers Other (See Comments)    Dropped heart rate to the 40's  . Calcium Channel Blockers Other (See Comments)    Dropped HR into the 40's  . Naproxen Hives    PHYSICAL EXAM:  Performance status (ECOG): 1 - Symptomatic but completely ambulatory  Vitals:   05/08/20 1456  BP: (!) 154/52  Pulse: 66  Resp: 18  Temp: (!) 97.4 F (36.3 C)  SpO2: 99%   Wt Readings from Last 3 Encounters:  05/08/20 181 lb (82.1 kg)  02/14/20 178 lb (80.7 kg)  02/02/20 181 lb 14.1 oz (82.5 kg)   Physical Exam Vitals reviewed.  Constitutional:      Appearance: Normal appearance.     Comments: In wheelchair  Cardiovascular:  Rate and Rhythm: Normal rate and regular rhythm.     Pulses: Normal pulses.     Heart sounds: Normal heart sounds.  Pulmonary:     Effort: Pulmonary effort is normal.     Breath sounds: Normal breath sounds.  Abdominal:     Palpations: Abdomen is soft. There is no mass.      Tenderness: There is no abdominal tenderness.  Musculoskeletal:     Right lower leg: No edema.     Left lower leg: No edema.  Neurological:     General: No focal deficit present.     Mental Status: She is alert and oriented to person, place, and time.  Psychiatric:        Mood and Affect: Mood normal.        Behavior: Behavior normal.      LABORATORY DATA:  I have reviewed the labs as listed.  CBC Latest Ref Rng & Units 05/04/2020 02/14/2020 01/25/2020  WBC 4.0 - 10.5 K/uL 4.6 6.7 4.9  Hemoglobin 12.0 - 15.0 g/dL 12.0 10.0(L) 10.3(L)  Hematocrit 36.0 - 46.0 % 38.6 32.3(L) 33.3(L)  Platelets 150 - 400 K/uL 218 401(H) 329   CMP Latest Ref Rng & Units 05/04/2020 02/14/2020 01/25/2020  Glucose 70 - 99 mg/dL - 154(H) 140(H)  BUN 8 - 23 mg/dL - 11 16  Creatinine 0.44 - 1.00 mg/dL 0.90 0.81 0.88  Sodium 135 - 145 mmol/L - 140 140  Potassium 3.5 - 5.1 mmol/L - 3.5 3.2(L)  Chloride 98 - 111 mmol/L - 102 101  CO2 22 - 32 mmol/L - 29 26  Calcium 8.9 - 10.3 mg/dL - 9.3 9.7  Total Protein 6.5 - 8.1 g/dL - 7.3 7.1  Total Bilirubin 0.3 - 1.2 mg/dL - 0.7 0.5  Alkaline Phos 38 - 126 U/L - 51 43  AST 15 - 41 U/L - 11(L) 16  ALT 0 - 44 U/L - 13 14   Lab Results  Component Value Date   TIBC 292 05/04/2020   TIBC 311 01/25/2020   TIBC 316 10/27/2019   FERRITIN 123 05/04/2020   FERRITIN 19 01/25/2020   FERRITIN 88 10/27/2019   IRONPCTSAT 20 05/04/2020   IRONPCTSAT 10 (L) 01/25/2020   IRONPCTSAT 19 10/27/2019    DIAGNOSTIC IMAGING:  I have independently reviewed the scans and discussed with the patient. CT Chest W Contrast  Result Date: 05/05/2020 CLINICAL DATA:  Surveillance for stage I RIGHT lung adenocarcinoma EXAM: CT CHEST WITH CONTRAST TECHNIQUE: Multidetector CT imaging of the chest was performed during intravenous contrast administration. CONTRAST:  73mL OMNIPAQUE IOHEXOL 300 MG/ML  SOLN COMPARISON:  CT 02/14/2020, 10/27/2019 FINDINGS: Cardiovascular: No significant vascular  findings. Normal heart size. No pericardial effusion. Post CABG. Mediastinum/Nodes: No axillary or supraclavicular adenopathy. No mediastinal or hilar adenopathy. No pericardial fluid. Esophagus normal. Lungs/Pleura: Post RIGHT upper lobectomy. No pulmonary nodularity. Subpleural nodule in the RIGHT lower lobe measures 5 mm (image 75/4) not changed from CT 10/27/2019. Interval resolution of the small pleural effusions seen on most recent CT scan. Upper Abdomen: Limited view of the liver, kidneys, pancreas are unremarkable. Normal adrenal glands. Mild thickening of the LEFT adrenal gland 11 mm unchanged. Musculoskeletal: No aggressive osseous lesion IMPRESSION: 1. Post RIGHT upper lobe resection for stage I non-small cell lung cancer. No evidence of lung cancer recurrence or metastasis. 2. Post CABG. 3. Clearing of bilateral pleural effusions seen on most recent CT exam Electronically Signed   By: Suzy Bouchard M.D.   On:  05/05/2020 08:23     ASSESSMENT:  1. 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 -PET CT scan on 04/26/2019 showed mildly hypermetabolic 1.1 cm right upper lobe pulmonary nodule with SUV 2.4. No findings of adenopathy or metastatic disease. -Right upper lobectomy on 05/13/2019 shows 1.3 cm invasive adenocarcinoma, negative for visceral pleural invasion, LVI negative, margins negative. Lymph nodes from stations 7, 11 R, 10 R, 4R, 12Rare negative. -CT chest surveillance every 6 months for 2 to 3 years was recommended followed by noncontrast CT annually. -CT chest on 10/27/2019 showed surgical changes in the right upper lobe lobectomy with no findings of recurrence. 2 small indeterminate right lower lobe pulmonary nodules, follow-up recommended. -CT chest on 05/04/2020 with post right upper lobe  resection with no evidence of recurrence or metastasis.  Clearing of bilateral pleural effusions.  2. Normocytic anemia: -Her previous hemoglobin was 7.6 with MCV of 88. -She has mild degree of chronic kidney disease which is likely contributing to her anemia. -Colonoscopy earlier this year was reportedly normal. -Last Feraheme on 02/11/2020 and 02/21/2020.  3. Weight loss: -She lost about 20-25 pounds since her surgery. -She is slowly gaining back. Her appetite is improving.   PLAN:  1. Stage I (PT 1 BPN 0) right upper lobe adenocarcinoma: -Reviewed CT chest with contrast from 05/04/2020 which did not show any evidence of recurrence. -We will consider repeating chest CT in 6 months.  2. Normocytic anemia: -She received Feraheme on 02/11/2020 and 02/21/2020. -Reviewed labs from 05/04/2020.  Ferritin is 123.  Hemoglobin is 12.0. -Recommend follow-up in 3 months with labs.  3.Vitamin B12 deficiency: -B12 level is normal.  Continue B12 1 mg tablet daily.   Orders placed this encounter:  No orders of the defined types were placed in this encounter.    Karen Jack, MD [place] [phone]   I, Milinda Antis, am acting as a scribe for Dr. Sanda Linger.  I, Karen Jack MD, have reviewed the above documentation for accuracy and completeness, and I agree with the above.

## 2020-05-08 NOTE — Patient Instructions (Signed)
[place] at [facility] Discharge Instructions  You were seen today by Dr. Delton Coombes. He went over your recent results and scans. For your bone health, take calcium 1,200 mg and vitamin D 1,000 units daily. You will be getting CT scans every 6 months to track the status of your lung cancer. Dr. Delton Coombes will see you back in 3 months for labs and follow up.   Thank you for choosing [place] at [facility] to provide your oncology and hematology care.  To afford each patient quality time with our provider, please arrive at least 15 minutes before your scheduled appointment time.   If you have a lab appointment with the [facility] please come in thru the Main Entrance and check in at the main information desk  You need to re-schedule your appointment should you arrive 10 or more minutes late.  We strive to give you quality time with our providers, and arriving late affects you and other patients whose appointments are after yours.  Also, if you no show three or more times for appointments you may be dismissed from the clinic at the providers discretion.     Again, thank you for choosing [facility].  Our hope is that these requests will decrease the amount of time that you wait before being seen by our physicians.       _____________________________________________________________  Should you have questions after your visit to [facility], please contact our office at [phone] between the hours of 8:00 a.m. and 4:30 p.m.  Voicemails left after 4:00 p.m. will not be returned until the following business day.  For prescription refill requests, have your pharmacy contact our office and allow 72 hours.    Cancer Center Support Programs:   > Cancer Support Group  2nd Tuesday of the month 1pm-2pm, Journey Room

## 2020-05-09 ENCOUNTER — Ambulatory Visit: Payer: Medicare Other | Admitting: Thoracic Surgery (Cardiothoracic Vascular Surgery)

## 2020-05-09 ENCOUNTER — Ambulatory Visit
Admission: RE | Admit: 2020-05-09 | Discharge: 2020-05-09 | Disposition: A | Discharge status: Home / Self Care | Payer: Medicare HMO | Source: Ambulatory Visit | Attending: Thoracic Surgery (Cardiothoracic Vascular Surgery) | Admitting: Thoracic Surgery (Cardiothoracic Vascular Surgery)

## 2020-05-09 VITALS — BP 137/72 | HR 58 | Temp 97.9°F | Resp 20 | Ht 65.0 in | Wt 181.0 lb

## 2020-05-09 DIAGNOSIS — C3491 Malignant neoplasm of unspecified part of right bronchus or lung: Secondary | ICD-10-CM | POA: Diagnosis not present

## 2020-05-09 DIAGNOSIS — R0602 Shortness of breath: Secondary | ICD-10-CM | POA: Diagnosis not present

## 2020-05-09 DIAGNOSIS — Z902 Acquired absence of lung [part of]: Secondary | ICD-10-CM

## 2020-05-09 NOTE — Progress Notes (Signed)
[address]       [place] [address]             [phone]     HPI: Karen Tate returns for a scheduled follow-up visit  Karen Tate is a 75 year old woman with a past history of tobacco abuse, stage Ia non-small cell carcinoma, CAD, coronary bypass grafting, postoperative atrial fibrillation, DVT, PE, arthritis, reflux, hypertension, hyperlipidemia, type 2 diabetes, and glaucoma.  I did a robotic right upper lobectomy for stage Ia non-small cell carcinoma in February 2021.  I last saw her in June.  She was feeling better at that time.  She saw Dr. Delton Coombes yesterday.  She is been feeling well.  She does have some occasional tingling pain around her right costal margin.  She is still cannot lie on her right side.  She is not taking anything for pain.  Her respiratory status has been stable.  Past Medical History:  Diagnosis Date  . Anxiety neurosis   . Arthritis   . Asthma   . CAD (coronary artery disease)    a. 08/2017: abnormal nuc-> sent directly to [facility], NSTEMI, s/p CABG (left internal mammary artery to left anterior descending, sequential saphenous vein graft to ramus intermedius branches 1 and 2).  . Carotid artery disease ([place])   . Chronic anemia   . Dizziness   . GERD (gastroesophageal reflux disease)   . Glaucoma   . Heart disease   . Hiatal hernia   . Hyperlipemia   . Hypertension   . Ischemic cardiomyopathy   . Joint pain   . Mitral regurgitation   . Postoperative atrial fibrillation ([place]) 08/2017  . Pulmonary embolus ([place])   . Type 2 diabetes mellitus ([place])     Current Outpatient Medications  Medication Sig Dispense Refill  . acetaminophen (TYLENOL) 500 MG tablet Take 1,000 mg by mouth every 6 (six) hours as needed for moderate pain.     Marland Kitchen ALPRAZolam (XANAX) 0.5 MG tablet Take 0.5 mg by mouth 2 (two) times daily.    Marland Kitchen amLODipine (NORVASC) 10 MG tablet Take 10 mg by mouth daily.    Marland Kitchen aspirin 81 MG tablet Take 1 tablet (81 mg total) by  mouth daily.    Marland Kitchen atorvastatin (LIPITOR) 80 MG tablet Take 1 tablet (80 mg total) by mouth daily at 6 PM. 90 tablet 3  . carvedilol (COREG) 25 MG tablet Take 1 tablet by mouth 2 (two) times daily.    Marland Kitchen doxycycline (VIBRA-TABS) 100 MG tablet Take 100 mg by mouth 2 (two) times daily.    . ferrous sulfate 325 (65 FE) MG tablet Take 325 mg by mouth 2 (two) times daily.    . fish oil-omega-3 fatty acids 1000 MG capsule Take 1 g by mouth 3 (three) times daily.     . furosemide (LASIX) 20 MG tablet Take 20 mg by mouth daily.    Marland Kitchen glipiZIDE (GLUCOTROL) 5 MG tablet Take 5 mg by mouth 2 (two) times daily before a meal.    . ketoconazole (NIZORAL) 2 % cream SMARTSIG:1 Topical Every Night    . latanoprost (XALATAN) 0.005 % ophthalmic solution Place 1 drop into both eyes at bedtime.    Marland Kitchen lisinopril (ZESTRIL) 40 MG tablet Take 1 tablet (40 mg total) by mouth daily. 90 tablet 1  . magnesium oxide (MAG-OX) 400 (241.3 Mg) MG tablet Take 1 tablet by mouth 2 (two) times daily.    . magnesium oxide (MAG-OX) 400  MG tablet Take 1 tablet (400 mg total) by mouth 2 (two) times daily. (Patient taking differently: Take 400 mg by mouth daily. ) 180 tablet 3  . meclizine (ANTIVERT) 25 MG tablet Take 25 mg by mouth 3 (three) times daily as needed for dizziness.     . metFORMIN (GLUCOPHAGE) 1000 MG tablet Take 1,000 mg by mouth daily.     . Multiple Vitamins-Minerals (CENTRUM SILVER 50+WOMEN) TABS Take 1 tablet by mouth daily.     Marland Kitchen omeprazole (PRILOSEC) 40 MG capsule Take 40 mg by mouth daily.    . potassium chloride SA (KLOR-CON) 20 MEQ tablet TAKE 2 TABLETS DAILY FOR 3 DAYS 6 tablet 0  . tiZANidine (ZANAFLEX) 4 MG tablet Take 1 tablet (4 mg total) by mouth every 6 (six) hours as needed for muscle spasms. 60 tablet 0  . vitamin B-12 (CYANOCOBALAMIN) 500 MCG tablet Take 500 mcg by mouth daily.     No current facility-administered medications for this visit.    Physical Exam BP 137/72 (BP Location: Right Arm, Patient  Position: Sitting, Cuff Size: Normal)   Pulse (!) 58   Temp 97.9 F (36.6 C) (Skin)   Resp 20   Ht 5\' 5"  (1.651 m)   Wt 181 lb (82.1 kg)   SpO2 97% Comment: RA  BMI 30.11 kg/m  75 year old woman in no acute distress Alert and oriented x3 with no focal deficits No cervical or supraclavicular adenopathy Lungs diminished at right base, otherwise clear Cardiac regular rate and rhythm Incisions well-healed  Diagnostic Tests: CHEST - 2 VIEW  COMPARISON:  Single-view of the chest 02/14/2020. CT chest 05/04/2020.  FINDINGS: Volume loss in the right chest consistent with right upper lobectomy again seen. The lungs are clear. No pneumothorax or pleural effusion. The patient is status post CABG. Atherosclerosis noted.  IMPRESSION: No acute disease.  Status post CABG and right upper lobectomy.   Electronically Signed   By: Inge Rise M.D.   On: 05/09/2020 10:43 CT CHEST WITH CONTRAST  TECHNIQUE: Multidetector CT imaging of the chest was performed during intravenous contrast administration.  CONTRAST:  32mL OMNIPAQUE IOHEXOL 300 MG/ML  SOLN  COMPARISON:  CT 02/14/2020, 10/27/2019  FINDINGS: Cardiovascular: No significant vascular findings. Normal heart size. No pericardial effusion. Post CABG.  Mediastinum/Nodes: No axillary or supraclavicular adenopathy. No mediastinal or hilar adenopathy. No pericardial fluid. Esophagus normal.  Lungs/Pleura: Post RIGHT upper lobectomy. No pulmonary nodularity. Subpleural nodule in the RIGHT lower lobe measures 5 mm (image 75/4) not changed from CT 10/27/2019. Interval resolution of the small pleural effusions seen on most recent CT scan.  Upper Abdomen: Limited view of the liver, kidneys, pancreas are unremarkable. Normal adrenal glands. Mild thickening of the LEFT adrenal gland 11 mm unchanged.  Musculoskeletal: No aggressive osseous lesion  IMPRESSION: 1. Post RIGHT upper lobe resection for stage I  non-small cell lung cancer. No evidence of lung cancer recurrence or metastasis. 2. Post CABG. 3. Clearing of bilateral pleural effusions seen on most recent CT exam   Electronically Signed   By: Suzy Bouchard M.D.   On: 05/05/2020 08:23 I personally reviewed the CT and chest x-ray images.  Postoperative changes present.  No evidence of recurrent disease.  Impression: Karen Tate is a 75 year old woman with a past history of tobacco abuse, stage Ia non-small cell carcinoma, CAD, coronary bypass grafting, postoperative atrial fibrillation, DVT, PE, arthritis, reflux, hypertension, hyperlipidemia, type 2 diabetes, and glaucoma.  Stage Ia non-small cell carcinoma of the right upper lobe-now  1 year out from right upper lobectomy.  No evidence of recurrent disease on CT.  She will continue to follow with Dr. Delton Coombes.  Status post right upper lobectomy-does still have some tingling pain on occasion.  Consistent with intercostal neuralgia.  She does not feel this is severe enough to warrant any pain medication.  CAD status post coronary artery bypass grafting-no anginal symptoms.  Followed by [facility] cardiology in [place].  Plan: Follow-up with Dr. Delton Coombes I will be happy to see Karen Tate back at anytime in the future if I can be of any further assistance with her care  [name], MD [facility] [phone]

## 2020-05-16 DIAGNOSIS — U071 COVID-19: Secondary | ICD-10-CM | POA: Diagnosis not present

## 2020-05-25 ENCOUNTER — Ambulatory Visit: Payer: Medicare HMO | Admitting: Orthopedic Surgery

## 2020-05-29 DIAGNOSIS — R531 Weakness: Secondary | ICD-10-CM | POA: Diagnosis not present

## 2020-05-30 DIAGNOSIS — L89611 Pressure ulcer of right heel, stage 1: Secondary | ICD-10-CM | POA: Diagnosis not present

## 2020-06-13 DIAGNOSIS — U071 COVID-19: Secondary | ICD-10-CM | POA: Diagnosis not present

## 2020-06-21 DIAGNOSIS — I10 Essential (primary) hypertension: Secondary | ICD-10-CM | POA: Diagnosis not present

## 2020-06-21 DIAGNOSIS — E1143 Type 2 diabetes mellitus with diabetic autonomic (poly)neuropathy: Secondary | ICD-10-CM | POA: Diagnosis not present

## 2020-06-21 DIAGNOSIS — E7849 Other hyperlipidemia: Secondary | ICD-10-CM | POA: Diagnosis not present

## 2020-06-29 DIAGNOSIS — R531 Weakness: Secondary | ICD-10-CM | POA: Diagnosis not present

## 2020-07-12 DIAGNOSIS — M47812 Spondylosis without myelopathy or radiculopathy, cervical region: Secondary | ICD-10-CM | POA: Diagnosis not present

## 2020-07-12 DIAGNOSIS — S233XXA Sprain of ligaments of thoracic spine, initial encounter: Secondary | ICD-10-CM | POA: Diagnosis not present

## 2020-07-12 DIAGNOSIS — M9901 Segmental and somatic dysfunction of cervical region: Secondary | ICD-10-CM | POA: Diagnosis not present

## 2020-07-12 DIAGNOSIS — Z0000 Encounter for general adult medical examination without abnormal findings: Secondary | ICD-10-CM | POA: Diagnosis not present

## 2020-07-12 DIAGNOSIS — E7849 Other hyperlipidemia: Secondary | ICD-10-CM | POA: Diagnosis not present

## 2020-07-12 DIAGNOSIS — E1143 Type 2 diabetes mellitus with diabetic autonomic (poly)neuropathy: Secondary | ICD-10-CM | POA: Diagnosis not present

## 2020-07-12 DIAGNOSIS — M9903 Segmental and somatic dysfunction of lumbar region: Secondary | ICD-10-CM | POA: Diagnosis not present

## 2020-07-12 DIAGNOSIS — M47816 Spondylosis without myelopathy or radiculopathy, lumbar region: Secondary | ICD-10-CM | POA: Diagnosis not present

## 2020-07-12 DIAGNOSIS — J449 Chronic obstructive pulmonary disease, unspecified: Secondary | ICD-10-CM | POA: Diagnosis not present

## 2020-07-12 DIAGNOSIS — M9902 Segmental and somatic dysfunction of thoracic region: Secondary | ICD-10-CM | POA: Diagnosis not present

## 2020-07-12 DIAGNOSIS — Z6832 Body mass index (BMI) 32.0-32.9, adult: Secondary | ICD-10-CM | POA: Diagnosis not present

## 2020-07-12 DIAGNOSIS — I10 Essential (primary) hypertension: Secondary | ICD-10-CM | POA: Diagnosis not present

## 2020-07-14 DIAGNOSIS — M47812 Spondylosis without myelopathy or radiculopathy, cervical region: Secondary | ICD-10-CM | POA: Diagnosis not present

## 2020-07-14 DIAGNOSIS — U071 COVID-19: Secondary | ICD-10-CM | POA: Diagnosis not present

## 2020-07-14 DIAGNOSIS — M9901 Segmental and somatic dysfunction of cervical region: Secondary | ICD-10-CM | POA: Diagnosis not present

## 2020-07-14 DIAGNOSIS — S233XXA Sprain of ligaments of thoracic spine, initial encounter: Secondary | ICD-10-CM | POA: Diagnosis not present

## 2020-07-14 DIAGNOSIS — M9903 Segmental and somatic dysfunction of lumbar region: Secondary | ICD-10-CM | POA: Diagnosis not present

## 2020-07-14 DIAGNOSIS — M47816 Spondylosis without myelopathy or radiculopathy, lumbar region: Secondary | ICD-10-CM | POA: Diagnosis not present

## 2020-07-14 DIAGNOSIS — M9902 Segmental and somatic dysfunction of thoracic region: Secondary | ICD-10-CM | POA: Diagnosis not present

## 2020-07-18 DIAGNOSIS — M9901 Segmental and somatic dysfunction of cervical region: Secondary | ICD-10-CM | POA: Diagnosis not present

## 2020-07-18 DIAGNOSIS — M47812 Spondylosis without myelopathy or radiculopathy, cervical region: Secondary | ICD-10-CM | POA: Diagnosis not present

## 2020-07-18 DIAGNOSIS — M47816 Spondylosis without myelopathy or radiculopathy, lumbar region: Secondary | ICD-10-CM | POA: Diagnosis not present

## 2020-07-18 DIAGNOSIS — M9902 Segmental and somatic dysfunction of thoracic region: Secondary | ICD-10-CM | POA: Diagnosis not present

## 2020-07-18 DIAGNOSIS — S233XXA Sprain of ligaments of thoracic spine, initial encounter: Secondary | ICD-10-CM | POA: Diagnosis not present

## 2020-07-18 DIAGNOSIS — M9903 Segmental and somatic dysfunction of lumbar region: Secondary | ICD-10-CM | POA: Diagnosis not present

## 2020-07-19 DIAGNOSIS — I5022 Chronic systolic (congestive) heart failure: Secondary | ICD-10-CM | POA: Diagnosis not present

## 2020-07-19 DIAGNOSIS — Z6832 Body mass index (BMI) 32.0-32.9, adult: Secondary | ICD-10-CM | POA: Diagnosis not present

## 2020-07-19 DIAGNOSIS — J1529 Pneumonia due to other staphylococcus: Secondary | ICD-10-CM | POA: Diagnosis not present

## 2020-07-19 DIAGNOSIS — J441 Chronic obstructive pulmonary disease with (acute) exacerbation: Secondary | ICD-10-CM | POA: Diagnosis not present

## 2020-07-20 ENCOUNTER — Observation Stay (HOSPITAL_COMMUNITY)
Admission: EM | Admit: 2020-07-20 | Discharge: 2020-07-21 | Disposition: A | Discharge status: Home / Self Care | Payer: Medicare HMO | Attending: Family Medicine | Admitting: Family Medicine

## 2020-07-20 ENCOUNTER — Emergency Department (HOSPITAL_COMMUNITY): Payer: Medicare HMO

## 2020-07-20 ENCOUNTER — Other Ambulatory Visit: Payer: Self-pay

## 2020-07-20 DIAGNOSIS — E119 Type 2 diabetes mellitus without complications: Secondary | ICD-10-CM

## 2020-07-20 DIAGNOSIS — Z20822 Contact with and (suspected) exposure to covid-19: Secondary | ICD-10-CM | POA: Insufficient documentation

## 2020-07-20 DIAGNOSIS — Z7982 Long term (current) use of aspirin: Secondary | ICD-10-CM | POA: Insufficient documentation

## 2020-07-20 DIAGNOSIS — I509 Heart failure, unspecified: Secondary | ICD-10-CM | POA: Diagnosis not present

## 2020-07-20 DIAGNOSIS — Z96652 Presence of left artificial knee joint: Secondary | ICD-10-CM | POA: Insufficient documentation

## 2020-07-20 DIAGNOSIS — J45909 Unspecified asthma, uncomplicated: Secondary | ICD-10-CM | POA: Insufficient documentation

## 2020-07-20 DIAGNOSIS — I110 Hypertensive heart disease with heart failure: Secondary | ICD-10-CM | POA: Diagnosis not present

## 2020-07-20 DIAGNOSIS — Z7984 Long term (current) use of oral hypoglycemic drugs: Secondary | ICD-10-CM | POA: Insufficient documentation

## 2020-07-20 DIAGNOSIS — Z87891 Personal history of nicotine dependence: Secondary | ICD-10-CM | POA: Diagnosis not present

## 2020-07-20 DIAGNOSIS — I2510 Atherosclerotic heart disease of native coronary artery without angina pectoris: Secondary | ICD-10-CM | POA: Diagnosis not present

## 2020-07-20 DIAGNOSIS — Z85118 Personal history of other malignant neoplasm of bronchus and lung: Secondary | ICD-10-CM | POA: Diagnosis not present

## 2020-07-20 DIAGNOSIS — E441 Mild protein-calorie malnutrition: Secondary | ICD-10-CM | POA: Diagnosis present

## 2020-07-20 DIAGNOSIS — R0602 Shortness of breath: Secondary | ICD-10-CM | POA: Diagnosis not present

## 2020-07-20 DIAGNOSIS — Z951 Presence of aortocoronary bypass graft: Secondary | ICD-10-CM | POA: Diagnosis not present

## 2020-07-20 DIAGNOSIS — J9621 Acute and chronic respiratory failure with hypoxia: Secondary | ICD-10-CM | POA: Diagnosis not present

## 2020-07-20 DIAGNOSIS — Z79899 Other long term (current) drug therapy: Secondary | ICD-10-CM | POA: Insufficient documentation

## 2020-07-20 DIAGNOSIS — J90 Pleural effusion, not elsewhere classified: Secondary | ICD-10-CM | POA: Diagnosis not present

## 2020-07-20 DIAGNOSIS — R079 Chest pain, unspecified: Secondary | ICD-10-CM | POA: Diagnosis not present

## 2020-07-20 DIAGNOSIS — I517 Cardiomegaly: Secondary | ICD-10-CM | POA: Diagnosis not present

## 2020-07-20 LAB — CBC WITH DIFFERENTIAL/PLATELET
Abs Immature Granulocytes: 0.03 10*3/uL (ref 0.00–0.07)
Basophils Absolute: 0 10*3/uL (ref 0.0–0.1)
Basophils Relative: 1 %
Eosinophils Absolute: 0.1 10*3/uL (ref 0.0–0.5)
Eosinophils Relative: 1 %
HCT: 30.4 % — ABNORMAL LOW (ref 36.0–46.0)
Hemoglobin: 9.5 g/dL — ABNORMAL LOW (ref 12.0–15.0)
Immature Granulocytes: 1 %
Lymphocytes Relative: 20 %
Lymphs Abs: 1.3 10*3/uL (ref 0.7–4.0)
MCH: 30.9 pg (ref 26.0–34.0)
MCHC: 31.3 g/dL (ref 30.0–36.0)
MCV: 99 fL (ref 80.0–100.0)
Monocytes Absolute: 0.5 10*3/uL (ref 0.1–1.0)
Monocytes Relative: 8 %
Neutro Abs: 4.6 10*3/uL (ref 1.7–7.7)
Neutrophils Relative %: 69 %
Platelets: 297 10*3/uL (ref 150–400)
RBC: 3.07 MIL/uL — ABNORMAL LOW (ref 3.87–5.11)
RDW: 14.6 % (ref 11.5–15.5)
WBC: 6.6 10*3/uL (ref 4.0–10.5)
nRBC: 0 % (ref 0.0–0.2)

## 2020-07-20 LAB — COMPREHENSIVE METABOLIC PANEL
ALT: 35 U/L (ref 0–44)
AST: 20 U/L (ref 15–41)
Albumin: 3.2 g/dL — ABNORMAL LOW (ref 3.5–5.0)
Alkaline Phosphatase: 74 U/L (ref 38–126)
Anion gap: 8 (ref 5–15)
BUN: 21 mg/dL (ref 8–23)
CO2: 29 mmol/L (ref 22–32)
Calcium: 9.1 mg/dL (ref 8.9–10.3)
Chloride: 105 mmol/L (ref 98–111)
Creatinine, Ser: 1.11 mg/dL — ABNORMAL HIGH (ref 0.44–1.00)
GFR, Estimated: 52 mL/min — ABNORMAL LOW (ref >60)
Glucose, Bld: 157 mg/dL — ABNORMAL HIGH (ref 70–99)
Potassium: 3.6 mmol/L (ref 3.5–5.1)
Sodium: 142 mmol/L (ref 135–145)
Total Bilirubin: 0.8 mg/dL (ref 0.3–1.2)
Total Protein: 7.1 g/dL (ref 6.5–8.1)

## 2020-07-20 LAB — BRAIN NATRIURETIC PEPTIDE: B Natriuretic Peptide: 330 pg/mL — ABNORMAL HIGH (ref 0.0–100.0)

## 2020-07-20 LAB — TROPONIN I (HIGH SENSITIVITY): Troponin I (High Sensitivity): 9 ng/L (ref <18)

## 2020-07-20 NOTE — ED Provider Notes (Incomplete)
[facility] EMERGENCY DEPARTMENT Provider Note   CSN: [id number] Arrival date & time: 07/20/20  2033     History Chief Complaint  Patient presents with  . Shortness of Breath    Karen Tate is a 75 y.o. female with a history of asthma, coronary artery disease with history of bypass surgery, hypertension, ischemic cardiomyopathy, history of mitral regurg and history of pulmonary embolism, also history of lung cancer which was surgically treated presenting with a 3-day history of increasing shortness of breath and ankle edema.  She describes difficulty walking across the room without getting significantly short of breath.  She has had no fevers or chills.  She does have an occasional cough which has been nonproductive.  She does have frequent swelling in her legs, generally left greater than right and she does have increased swelling today.  She was seen by her PCP on Tuesday and treated for presumptive pneumonia with doxycycline.  She does not feel any better therefore presents here for further evaluation.  HPI     Past Medical History:  Diagnosis Date  . Anxiety neurosis   . Arthritis   . Asthma   . CAD (coronary artery disease)    a. 08/2017: abnormal nuc-> sent directly to [facility], NSTEMI, s/p CABG (left internal mammary artery to left anterior descending, sequential saphenous vein graft to ramus intermedius branches 1 and 2).  . Carotid artery disease ([place])   . Chronic anemia   . Dizziness   . GERD (gastroesophageal reflux disease)   . Glaucoma   . Heart disease   . Hiatal hernia   . Hyperlipemia   . Hypertension   . Ischemic cardiomyopathy   . Joint pain   . Mitral regurgitation   . Postoperative atrial fibrillation ([place]) 08/2017  . Pulmonary embolus ([place])   . Type 2 diabetes mellitus [facility])     Patient Active Problem List   Diagnosis Date Noted  . Non-small cell cancer of right lung ([place]) 06/29/2019  . Normocytic anemia 06/29/2019  . Postoperative pain 06/23/2019  .  S/P lobectomy of lung 05/13/2019  . Coronary artery disease involving native heart without angina pectoris   . Postop check   . S/P CABG x 3 09/18/2017  . Pulmonary embolus ([place]) 09/15/2017  . Type 2 diabetes mellitus ([place]) 09/15/2017  . Non-STEMI (non-ST elevated myocardial infarction) ([place])   . Unstable angina ([place]) 09/12/2017  . AP (angina pectoris) ([place])   . Abnormal stress test   . Chest pain   . 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 BACK SURGERY  2016  . CORONARY ARTERY BYPASS GRAFT N/A 09/18/2017   Procedure: CORONARY ARTERY BYPASS GRAFTING (CABG);  Surgeon: [name], MD;  Location: [place];  Service: Open Heart Surgery;  Laterality: N/A;  Saphenous vein harvest. LIMA to LAD Saphenous vein (sequential) ramus 1 & 2  . EYE SURGERY Bilateral    "surgery for glaucoma"  . HERNIA REPAIR    . INTERCOSTAL NERVE BLOCK Right 05/13/2019   Procedure: Intercostal Nerve Block;  Surgeon: [name], MD;  Location: [facility];  Service: Thoracic;  Laterality: Right;  . LEFT HEART CATH AND CORONARY ANGIOGRAPHY N/A 09/15/2017   Procedure: LEFT HEART CATH AND CORONARY ANGIOGRAPHY;  Surgeon: Lorretta Harp, MD;  Location: [place] CV [facility];  Service: Cardiovascular;  Laterality: N/A;  . LYMPH NODE DISSECTION Right 05/13/2019   Procedure: Lymph Node Dissection;  Surgeon: Roxan Hockey,  Revonda Standard, MD;  Location: [place];  Service: Thoracic;  Laterality: Right;  . ROTATOR CUFF REPAIR Left    x2  . SHOULDER SURGERY     LEFT  . TEE WITHOUT CARDIOVERSION N/A 09/18/2017   Procedure: TRANSESOPHAGEAL ECHOCARDIOGRAM (TEE);  Surgeon: [name], MD;  Location: [place];  Service: Open Heart Surgery;  Laterality: N/A;  . TOTAL KNEE ARTHROPLASTY     LEFT  . VAGINAL HYSTERECTOMY     partial     OB History   No obstetric history on file.     Family History  Problem Relation Age of Onset  . Diabetes Father   .  Heart disease Father   . Breast cancer Mother   . Congestive Heart Failure Brother   . Anemia Daughter   . Seizures Daughter   . Migraines Daughter   . Diabetes Daughter   . Hypertension Daughter   . Bladder Cancer Son   . Congestive Heart Failure Son   . Congestive Heart Failure Son     Social History   Tobacco Use  . Smoking status: Former Smoker    Packs/day: 1.25    Years: 16.00    Pack years: 20.00    Types: Cigarettes    Quit date: 03/25/1978    Years since quitting: 42.3  . Smokeless tobacco: Never Used  . Tobacco comment: quit more than 30 years ago  Vaping Use  . Vaping Use: Never used  Substance Use Topics  . Alcohol use: No  . Drug use: No    Home Medications Prior to Admission medications   Medication Sig Start Date End Date Taking? Authorizing Provider  acetaminophen (TYLENOL) 500 MG tablet Take 1,000 mg by mouth every 6 (six) hours as needed for moderate pain.     [provider]  ALPRAZolam Duanne Moron) 0.5 MG tablet Take 0.5 mg by mouth 2 (two) times daily.    [provider]  amLODipine (NORVASC) 10 MG tablet Take 10 mg by mouth daily.    [provider]  aspirin 81 MG tablet Take 1 tablet (81 mg total) by mouth daily. 10/20/17   Nani Skillern, PA-C  atorvastatin (LIPITOR) 80 MG tablet Take 1 tablet (80 mg total) by mouth daily at 6 PM. 11/17/17   Herminio Commons, MD  carvedilol (COREG) 25 MG tablet Take 1 tablet by mouth 2 (two) times daily. 01/14/20   [provider]  doxycycline (VIBRA-TABS) 100 MG tablet Take 100 mg by mouth 2 (two) times daily. 02/08/20   [provider]  ferrous sulfate 325 (65 FE) MG tablet Take 325 mg by mouth 2 (two) times daily.    [provider]  fish oil-omega-3 fatty acids 1000 MG capsule Take 1 g by mouth 3 (three) times daily.     [provider]  furosemide (LASIX) 20 MG tablet Take 20 mg by mouth daily.    [provider]  glipiZIDE (GLUCOTROL) 5  MG tablet Take 5 mg by mouth 2 (two) times daily before a meal.    [provider]  ketoconazole (NIZORAL) 2 % cream SMARTSIG:1 Topical Every Night 09/30/19   [provider]  latanoprost (XALATAN) 0.005 % ophthalmic solution Place 1 drop into both eyes at bedtime. 02/10/19   [provider]  lisinopril (ZESTRIL) 40 MG tablet Take 1 tablet (40 mg total) by mouth daily. 01/31/20 04/30/20  Arnoldo Lenis, MD  magnesium oxide (MAG-OX) 400 (241.3 Mg) MG tablet Take 1 tablet  by mouth 2 (two) times daily. 10/08/19   [provider]  magnesium oxide (MAG-OX) 400 MG tablet Take 1 tablet (400 mg total) by mouth 2 (two) times daily. Patient taking differently: Take 400 mg by mouth daily.  10/10/17   Dunn, Nedra Hai, PA-C  meclizine (ANTIVERT) 25 MG tablet Take 25 mg by mouth 3 (three) times daily as needed for dizziness.     [provider]  metFORMIN (GLUCOPHAGE) 1000 MG tablet Take 1,000 mg by mouth daily.     [provider]  Multiple Vitamins-Minerals (CENTRUM SILVER 50+WOMEN) TABS Take 1 tablet by mouth daily.     [provider]  omeprazole (PRILOSEC) 40 MG capsule Take 40 mg by mouth daily.    [provider]  potassium chloride SA (KLOR-CON) 20 MEQ tablet TAKE 2 TABLETS DAILY FOR 3 DAYS 01/31/20   Branch, Alphonse Guild, MD  tiZANidine (ZANAFLEX) 4 MG tablet Take 1 tablet (4 mg total) by mouth every 6 (six) hours as needed for muscle spasms. 10/02/14   Ashok Pall, MD  vitamin B-12 (CYANOCOBALAMIN) 500 MCG tablet Take 500 mcg by mouth daily.    [provider]    Allergies    Beta adrenergic blockers, Calcium channel blockers, and Naproxen  Review of Systems   Review of Systems  Constitutional: Negative for chills and fever.  HENT: Negative for congestion and sore throat.   Eyes: Negative.   Respiratory: Positive for cough and shortness of breath. Negative for chest tightness.   Cardiovascular: Positive for leg swelling.  Negative for chest pain.  Gastrointestinal: Negative for abdominal pain, nausea and vomiting.  Genitourinary: Negative.   Musculoskeletal: Negative for arthralgias, joint swelling and neck pain.  Skin: Negative.  Negative for rash and wound.  Neurological: Negative for dizziness, weakness, light-headedness, numbness and headaches.  Psychiatric/Behavioral: Negative.   All other systems reviewed and are negative.   Physical Exam Updated Vital Signs BP (!) 167/59 (BP Location: Right Arm)   Pulse 67   Temp 98.7 F (37.1 C) (Oral)   Resp (!) 22   Ht 5\' 5"  (1.651 m)   Wt 85.7 kg   SpO2 100%   BMI 31.43 kg/m   Physical Exam Vitals and nursing note reviewed.  Constitutional:      Appearance: She is well-developed.  HENT:     Head: Normocephalic and atraumatic.  Eyes:     Conjunctiva/sclera: Conjunctivae normal.  Cardiovascular:     Rate and Rhythm: Normal rate and regular rhythm.     Heart sounds: Normal heart sounds.  Pulmonary:     Effort: Pulmonary effort is normal.     Breath sounds: Normal breath sounds. No wheezing.  Abdominal:     General: Bowel sounds are normal.     Palpations: Abdomen is soft.     Tenderness: There is no abdominal tenderness.  Musculoskeletal:        General: Normal range of motion.     Cervical back: Normal range of motion.  Skin:    General: Skin is warm and dry.  Neurological:     Mental Status: She is alert.     ED Results / Procedures / Treatments   Labs (all labs ordered are listed, but only abnormal results are displayed) Labs Reviewed  CULTURE, BLOOD (ROUTINE X 2)  CULTURE, BLOOD (ROUTINE X 2)  CBC WITH DIFFERENTIAL/PLATELET  COMPREHENSIVE METABOLIC PANEL  BRAIN NATRIURETIC PEPTIDE  TROPONIN I (HIGH SENSITIVITY)    EKG None  Radiology [facility] Chest [facility]  1 View  Result Date: 07/20/2020 CLINICAL DATA:  Shortness of breath. EXAM: PORTABLE CHEST 1 VIEW COMPARISON:  05/09/2020, CT 05/04/2020 FINDINGS: Post median sternotomy. Mild  cardiomegaly. Right pleural effusion and basilar opacity, new from prior exam. There is mild chronic elevation of right hemidiaphragm, which may be postsurgical. Mild chronic bronchial thickening. No acute left lung airspace disease. No pneumothorax. IMPRESSION: 1. Right pleural effusion and basilar opacity, new from prior exam, may represent pneumonia with parapneumonic effusion or effusion with adjacent atelectasis. 2. Mild cardiomegaly. Electronically Signed   By: Keith Rake M.D.   On: 07/20/2020 21:29    Procedures Procedures {Remember to document critical care time when appropriate:1}  Medications Ordered in ED Medications - No data to display  ED Course  I have reviewed the triage vital signs and the nursing notes.  Pertinent labs & imaging results that were available during my care of the patient were reviewed by me and considered in my medical decision making (see chart for details).    MDM Rules/Calculators/A&P                          *** Final Clinical Impression(s) / ED Diagnoses Final diagnoses:  None    Rx / DC Orders ED Discharge Orders    None

## 2020-07-20 NOTE — ED Triage Notes (Signed)
Pt came in by POV report she is feeling SOB, she saw her doctor Tuesday and was prescribed ABXs and told she may have pneumonia and to come to ED, wears 2L o2 at baseline, 97%-100% on 2L.

## 2020-07-20 NOTE — ED Provider Notes (Signed)
[facility] EMERGENCY DEPARTMENT Provider Note   CSN: [id number] Arrival date & time: 07/20/20  2033     History Chief Complaint  Patient presents with  . Shortness of Breath    Karen Tate is a 75 y.o. female with a history of asthma, coronary artery disease with history of bypass surgery, hypertension, ischemic cardiomyopathy, history of mitral regurg and history of pulmonary embolism, also history of lung cancer which was surgically treated presenting with a 3-day history of increasing shortness of breath and ankle edema.  She describes difficulty walking across the room without getting significantly short of breath.  She has had no fevers or chills.  She does have an occasional cough which has been nonproductive.  She does have frequent swelling in her legs, generally left greater than right and she does have increased swelling today.  She was seen by her PCP on Tuesday and treated for presumptive pneumonia with doxycycline.  She does not feel any better therefore presents here for further evaluation.  HPI     Past Medical History:  Diagnosis Date  . Anxiety neurosis   . Arthritis   . Asthma   . CAD (coronary artery disease)    a. 08/2017: abnormal nuc-> sent directly to [facility], NSTEMI, s/p CABG (left internal mammary artery to left anterior descending, sequential saphenous vein graft to ramus intermedius branches 1 and 2).  . Carotid artery disease ([place])   . Chronic anemia   . Dizziness   . GERD (gastroesophageal reflux disease)   . Glaucoma   . Heart disease   . Hiatal hernia   . Hyperlipemia   . Hypertension   . Ischemic cardiomyopathy   . Joint pain   . Mitral regurgitation   . Postoperative atrial fibrillation ([place]) 08/2017  . Pulmonary embolus ([place])   . Type 2 diabetes mellitus [facility])     Patient Active Problem List   Diagnosis Date Noted  . Non-small cell cancer of right lung ([place]) 06/29/2019  . Normocytic anemia 06/29/2019  . Postoperative pain 06/23/2019  .  S/P lobectomy of lung 05/13/2019  . Coronary artery disease involving native heart without angina pectoris   . Postop check   . S/P CABG x 3 09/18/2017  . Pulmonary embolus ([place]) 09/15/2017  . Type 2 diabetes mellitus ([place]) 09/15/2017  . Non-STEMI (non-ST elevated myocardial infarction) ([place])   . Unstable angina ([place]) 09/12/2017  . AP (angina pectoris) ([place])   . Abnormal stress test   . Chest pain   . 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 BACK SURGERY  2016  . CORONARY ARTERY BYPASS GRAFT N/A 09/18/2017   Procedure: CORONARY ARTERY BYPASS GRAFTING (CABG);  Surgeon: [name], MD;  Location: [place];  Service: Open Heart Surgery;  Laterality: N/A;  Saphenous vein harvest. LIMA to LAD Saphenous vein (sequential) ramus 1 & 2  . EYE SURGERY Bilateral    "surgery for glaucoma"  . HERNIA REPAIR    . INTERCOSTAL NERVE BLOCK Right 05/13/2019   Procedure: Intercostal Nerve Block;  Surgeon: [name], MD;  Location: [place];  Service: Thoracic;  Laterality: Right;  . LEFT HEART CATH AND CORONARY ANGIOGRAPHY N/A 09/15/2017   Procedure: LEFT HEART CATH AND CORONARY ANGIOGRAPHY;  Surgeon: Lorretta Harp, MD;  Location: [place] [facility];  Service: Cardiovascular;  Laterality: N/A;  . LYMPH NODE DISSECTION Right 05/13/2019   Procedure: Lymph Node Dissection;  Surgeon: Roxan Hockey,  Revonda Standard, MD;  Location: [place];  Service: Thoracic;  Laterality: Right;  . ROTATOR CUFF REPAIR Left    x2  . SHOULDER SURGERY     LEFT  . TEE WITHOUT CARDIOVERSION N/A 09/18/2017   Procedure: TRANSESOPHAGEAL ECHOCARDIOGRAM (TEE);  Surgeon: [name], MD;  Location: [place];  Service: Open Heart Surgery;  Laterality: N/A;  . TOTAL KNEE ARTHROPLASTY     LEFT  . VAGINAL HYSTERECTOMY     partial     OB History   No obstetric history on file.     Family History  Problem Relation Age of Onset  . Diabetes Father   .  Heart disease Father   . Breast cancer Mother   . Congestive Heart Failure Brother   . Anemia Daughter   . Seizures Daughter   . Migraines Daughter   . Diabetes Daughter   . Hypertension Daughter   . Bladder Cancer Son   . Congestive Heart Failure Son   . Congestive Heart Failure Son     Social History   Tobacco Use  . Smoking status: Former Smoker    Packs/day: 1.25    Years: 16.00    Pack years: 20.00    Types: Cigarettes    Quit date: 03/25/1978    Years since quitting: 42.3  . Smokeless tobacco: Never Used  . Tobacco comment: quit more than 30 years ago  Vaping Use  . Vaping Use: Never used  Substance Use Topics  . Alcohol use: No  . Drug use: No    Home Medications Prior to Admission medications   Medication Sig Start Date End Date Taking? Authorizing Provider  acetaminophen (TYLENOL) 500 MG tablet Take 1,000 mg by mouth every 6 (six) hours as needed for moderate pain.     [provider]  ALPRAZolam Duanne Moron) 0.5 MG tablet Take 0.5 mg by mouth 2 (two) times daily.    [provider]  amLODipine (NORVASC) 10 MG tablet Take 10 mg by mouth daily.    [provider]  aspirin 81 MG tablet Take 1 tablet (81 mg total) by mouth daily. 10/20/17   Nani Skillern, PA-C  atorvastatin (LIPITOR) 80 MG tablet Take 1 tablet (80 mg total) by mouth daily at 6 PM. 11/17/17   Herminio Commons, MD  carvedilol (COREG) 25 MG tablet Take 1 tablet by mouth 2 (two) times daily. 01/14/20   [provider]  doxycycline (VIBRA-TABS) 100 MG tablet Take 100 mg by mouth 2 (two) times daily. 02/08/20   [provider]  ferrous sulfate 325 (65 FE) MG tablet Take 325 mg by mouth 2 (two) times daily.    [provider]  fish oil-omega-3 fatty acids 1000 MG capsule Take 1 g by mouth 3 (three) times daily.     [provider]  furosemide (LASIX) 20 MG tablet Take 20 mg by mouth daily.    [provider]  glipiZIDE (GLUCOTROL) 5  MG tablet Take 5 mg by mouth 2 (two) times daily before a meal.    [provider]  ketoconazole (NIZORAL) 2 % cream SMARTSIG:1 Topical Every Night 09/30/19   [provider]  latanoprost (XALATAN) 0.005 % ophthalmic solution Place 1 drop into both eyes at bedtime. 02/10/19   [provider]  lisinopril (ZESTRIL) 40 MG tablet Take 1 tablet (40 mg total) by mouth daily. 01/31/20 04/30/20  Arnoldo Lenis, MD  magnesium oxide (MAG-OX) 400 (241.3 Mg) MG tablet Take 1 tablet  by mouth 2 (two) times daily. 10/08/19   [provider]  magnesium oxide (MAG-OX) 400 MG tablet Take 1 tablet (400 mg total) by mouth 2 (two) times daily. Patient taking differently: Take 400 mg by mouth daily.  10/10/17   Dunn, Nedra Hai, PA-C  meclizine (ANTIVERT) 25 MG tablet Take 25 mg by mouth 3 (three) times daily as needed for dizziness.     [provider]  metFORMIN (GLUCOPHAGE) 1000 MG tablet Take 1,000 mg by mouth daily.     [provider]  Multiple Vitamins-Minerals (CENTRUM SILVER 50+WOMEN) TABS Take 1 tablet by mouth daily.     [provider]  omeprazole (PRILOSEC) 40 MG capsule Take 40 mg by mouth daily.    [provider]  potassium chloride SA (KLOR-CON) 20 MEQ tablet TAKE 2 TABLETS DAILY FOR 3 DAYS 01/31/20   Branch, Alphonse Guild, MD  tiZANidine (ZANAFLEX) 4 MG tablet Take 1 tablet (4 mg total) by mouth every 6 (six) hours as needed for muscle spasms. 10/02/14   Ashok Pall, MD  vitamin B-12 (CYANOCOBALAMIN) 500 MCG tablet Take 500 mcg by mouth daily.    [provider]    Allergies    Beta adrenergic blockers, Calcium channel blockers, and Naproxen  Review of Systems   Review of Systems  Constitutional: Negative for chills and fever.  HENT: Negative for congestion and sore throat.   Eyes: Negative.   Respiratory: Positive for cough and shortness of breath. Negative for chest tightness.   Cardiovascular: Positive for leg swelling.  Negative for chest pain.  Gastrointestinal: Negative for abdominal pain, nausea and vomiting.  Genitourinary: Negative.   Musculoskeletal: Negative for arthralgias, joint swelling and neck pain.  Skin: Negative.  Negative for rash and wound.  Neurological: Negative for dizziness, weakness, light-headedness, numbness and headaches.  Psychiatric/Behavioral: Negative.   All other systems reviewed and are negative.   Physical Exam Updated Vital Signs BP (!) 167/59 (BP Location: Right Arm)   Pulse 67   Temp 98.7 F (37.1 C) (Oral)   Resp (!) 22   Ht 5\' 5"  (1.651 m)   Wt 85.7 kg   SpO2 100%   BMI 31.43 kg/m   Physical Exam Vitals and nursing note reviewed.  Constitutional:      Appearance: She is well-developed.  HENT:     Head: Normocephalic and atraumatic.  Eyes:     Conjunctiva/sclera: Conjunctivae normal.  Cardiovascular:     Rate and Rhythm: Normal rate and regular rhythm.     Heart sounds: Normal heart sounds.  Pulmonary:     Effort: Pulmonary effort is normal.     Breath sounds: Examination of the right-lower field reveals decreased breath sounds. Decreased breath sounds present. No wheezing, rhonchi or rales.  Abdominal:     General: Bowel sounds are normal.     Palpations: Abdomen is soft.     Tenderness: There is no abdominal tenderness.  Musculoskeletal:        General: Normal range of motion.     Cervical back: Normal range of motion.  Skin:    General: Skin is warm and dry.  Neurological:     Mental Status: She is alert.     ED Results / Procedures / Treatments   Labs (all labs ordered are listed, but only abnormal results are displayed) Labs Reviewed  CULTURE, BLOOD (ROUTINE X 2)  CULTURE, BLOOD (ROUTINE X 2)  CBC WITH DIFFERENTIAL/PLATELET  COMPREHENSIVE METABOLIC PANEL  BRAIN NATRIURETIC PEPTIDE  TROPONIN  I (HIGH SENSITIVITY)    EKG EKG Interpretation  Date/Time:  Thursday July 20 2020 21:18:18 EDT Ventricular Rate:  81 PR  Interval:  68 QRS Duration: 92 QT Interval:  458 QTC Calculation: 509 R Axis:   59 Text Interpretation: Sinus rhythm Supraventricular bigeminy Short PR interval Nonspecific repol abnormality, inferior leads Prolonged QT interval Confirmed by Milton Ferguson [phone]) on 07/20/2020 9:50:19 PM   Radiology DG Chest Port 1 View  Result Date: 07/20/2020 CLINICAL DATA:  Shortness of breath. EXAM: PORTABLE CHEST 1 VIEW COMPARISON:  05/09/2020, CT 05/04/2020 FINDINGS: Post median sternotomy. Mild cardiomegaly. Right pleural effusion and basilar opacity, new from prior exam. There is mild chronic elevation of right hemidiaphragm, which may be postsurgical. Mild chronic bronchial thickening. No acute left lung airspace disease. No pneumothorax. IMPRESSION: 1. Right pleural effusion and basilar opacity, new from prior exam, may represent pneumonia with parapneumonic effusion or effusion with adjacent atelectasis. 2. Mild cardiomegaly. Electronically Signed   By: Keith Rake M.D.   On: 07/20/2020 21:29    Procedures Procedures   Medications Ordered in ED Medications - No data to display  ED Course  I have reviewed the triage vital signs and the nursing notes.  Pertinent labs & imaging results that were available during my care of the patient were reviewed by me and considered in my medical decision making (see chart for details).    MDM Rules/Calculators/A&P                          Pt with worsening sob with exertion, h/o PE, CAD, asthma, prior history of lung ca with surgical rul resection.  Right lower pleural effusion on imaging.  Labs suggesting possible chf with elevation in bnp.  She was given lasix 40 mg IV (takes 20 mg daily), pending Ct angio to rule out recurrent PE.  Pt discussed with Dr Stark Jock who assumes care of patient. Final Clinical Impression(s) / ED Diagnoses Final diagnoses:  None    Rx / DC Orders ED Discharge Orders    None       Landis Martins 07/21/20  [id number]    Veryl Speak, MD 07/21/20 [phone]

## 2020-07-21 ENCOUNTER — Emergency Department (HOSPITAL_COMMUNITY): Payer: Medicare HMO

## 2020-07-21 ENCOUNTER — Encounter (HOSPITAL_COMMUNITY): Payer: Self-pay | Admitting: Family Medicine

## 2020-07-21 ENCOUNTER — Observation Stay (HOSPITAL_BASED_OUTPATIENT_CLINIC_OR_DEPARTMENT_OTHER): Payer: Medicare HMO

## 2020-07-21 DIAGNOSIS — J9621 Acute and chronic respiratory failure with hypoxia: Secondary | ICD-10-CM | POA: Diagnosis not present

## 2020-07-21 DIAGNOSIS — R0602 Shortness of breath: Secondary | ICD-10-CM | POA: Diagnosis not present

## 2020-07-21 DIAGNOSIS — E441 Mild protein-calorie malnutrition: Secondary | ICD-10-CM | POA: Diagnosis not present

## 2020-07-21 DIAGNOSIS — R0609 Other forms of dyspnea: Secondary | ICD-10-CM

## 2020-07-21 DIAGNOSIS — J90 Pleural effusion, not elsewhere classified: Secondary | ICD-10-CM | POA: Diagnosis not present

## 2020-07-21 DIAGNOSIS — R079 Chest pain, unspecified: Secondary | ICD-10-CM | POA: Diagnosis not present

## 2020-07-21 DIAGNOSIS — I110 Hypertensive heart disease with heart failure: Secondary | ICD-10-CM | POA: Diagnosis not present

## 2020-07-21 DIAGNOSIS — I509 Heart failure, unspecified: Secondary | ICD-10-CM | POA: Diagnosis not present

## 2020-07-21 DIAGNOSIS — I50813 Acute on chronic right heart failure: Secondary | ICD-10-CM

## 2020-07-21 DIAGNOSIS — E1169 Type 2 diabetes mellitus with other specified complication: Secondary | ICD-10-CM

## 2020-07-21 DIAGNOSIS — I5023 Acute on chronic systolic (congestive) heart failure: Secondary | ICD-10-CM

## 2020-07-21 LAB — RESP PANEL BY RT-PCR (FLU A&B, COVID) ARPGX2
Influenza A by PCR: NEGATIVE
Influenza B by PCR: NEGATIVE
SARS Coronavirus 2 by RT PCR: NEGATIVE

## 2020-07-21 LAB — BRAIN NATRIURETIC PEPTIDE: B Natriuretic Peptide: 324 pg/mL — ABNORMAL HIGH (ref 0.0–100.0)

## 2020-07-21 LAB — COMPREHENSIVE METABOLIC PANEL
ALT: 30 U/L (ref 0–44)
AST: 15 U/L (ref 15–41)
Albumin: 3.2 g/dL — ABNORMAL LOW (ref 3.5–5.0)
Alkaline Phosphatase: 69 U/L (ref 38–126)
Anion gap: 9 (ref 5–15)
BUN: 18 mg/dL (ref 8–23)
CO2: 30 mmol/L (ref 22–32)
Calcium: 8.9 mg/dL (ref 8.9–10.3)
Chloride: 103 mmol/L (ref 98–111)
Creatinine, Ser: 0.99 mg/dL (ref 0.44–1.00)
GFR, Estimated: 59 mL/min — ABNORMAL LOW (ref >60)
Glucose, Bld: 161 mg/dL — ABNORMAL HIGH (ref 70–99)
Potassium: 3.2 mmol/L — ABNORMAL LOW (ref 3.5–5.1)
Sodium: 142 mmol/L (ref 135–145)
Total Bilirubin: 1.1 mg/dL (ref 0.3–1.2)
Total Protein: 6.7 g/dL (ref 6.5–8.1)

## 2020-07-21 LAB — CBC WITH DIFFERENTIAL/PLATELET
Abs Immature Granulocytes: 0.03 10*3/uL (ref 0.00–0.07)
Basophils Absolute: 0 10*3/uL (ref 0.0–0.1)
Basophils Relative: 1 %
Eosinophils Absolute: 0.1 10*3/uL (ref 0.0–0.5)
Eosinophils Relative: 1 %
HCT: 30.1 % — ABNORMAL LOW (ref 36.0–46.0)
Hemoglobin: 9.5 g/dL — ABNORMAL LOW (ref 12.0–15.0)
Immature Granulocytes: 1 %
Lymphocytes Relative: 21 %
Lymphs Abs: 1.3 10*3/uL (ref 0.7–4.0)
MCH: 31.1 pg (ref 26.0–34.0)
MCHC: 31.6 g/dL (ref 30.0–36.0)
MCV: 98.7 fL (ref 80.0–100.0)
Monocytes Absolute: 0.5 10*3/uL (ref 0.1–1.0)
Monocytes Relative: 8 %
Neutro Abs: 4.5 10*3/uL (ref 1.7–7.7)
Neutrophils Relative %: 68 %
Platelets: 274 10*3/uL (ref 150–400)
RBC: 3.05 MIL/uL — ABNORMAL LOW (ref 3.87–5.11)
RDW: 14.6 % (ref 11.5–15.5)
WBC: 6.4 10*3/uL (ref 4.0–10.5)
nRBC: 0 % (ref 0.0–0.2)

## 2020-07-21 LAB — HEMOGLOBIN A1C
Hgb A1c MFr Bld: 7 % — ABNORMAL HIGH (ref 4.8–5.6)
Mean Plasma Glucose: 154.2 mg/dL

## 2020-07-21 LAB — ECHOCARDIOGRAM COMPLETE
Area-P 1/2: 2.47 cm2
Height: 65 in
S' Lateral: 2.93 cm
Weight: 2984.15 oz

## 2020-07-21 LAB — GLUCOSE, CAPILLARY
Glucose-Capillary: 113 mg/dL — ABNORMAL HIGH (ref 70–99)
Glucose-Capillary: 181 mg/dL — ABNORMAL HIGH (ref 70–99)

## 2020-07-21 LAB — MAGNESIUM: Magnesium: 1.4 mg/dL — ABNORMAL LOW (ref 1.7–2.4)

## 2020-07-21 LAB — PROCALCITONIN: Procalcitonin: <0.1 ng/mL

## 2020-07-21 MED ORDER — ALPRAZOLAM 0.5 MG PO TABS: 0.5000 mg | ORAL_TABLET | Freq: Two times a day (BID) | ORAL | Status: DC | PRN

## 2020-07-21 MED ORDER — INSULIN ASPART 100 UNIT/ML IJ SOLN: 0.0000 [IU] | Freq: Every day | INTRAMUSCULAR | Status: DC

## 2020-07-21 MED ORDER — POTASSIUM CHLORIDE CRYS ER 20 MEQ PO TBCR: 20.0000 meq | EXTENDED_RELEASE_TABLET | Freq: Every day | ORAL | 1 refills | Status: DC

## 2020-07-21 MED ORDER — INSULIN ASPART 100 UNIT/ML IJ SOLN
0.0000 [IU] | Freq: Three times a day (TID) | INTRAMUSCULAR | Status: DC
  Administered 2020-07-21: 3 [IU] via SUBCUTANEOUS

## 2020-07-21 MED ORDER — HEPARIN SODIUM (PORCINE) 5000 UNIT/ML IJ SOLN
5000.0000 [IU] | Freq: Three times a day (TID) | INTRAMUSCULAR | Status: DC
  Administered 2020-07-21: 5000 [IU] via SUBCUTANEOUS
  Filled 2020-07-21: qty 1

## 2020-07-21 MED ORDER — ACETAMINOPHEN 325 MG PO TABS: 650.0000 mg | ORAL_TABLET | Freq: Four times a day (QID) | ORAL | Status: DC | PRN

## 2020-07-21 MED ORDER — FERROUS SULFATE 325 (65 FE) MG PO TABS
325.0000 mg | ORAL_TABLET | Freq: Two times a day (BID) | ORAL | Status: DC
  Administered 2020-07-21: 325 mg via ORAL
  Filled 2020-07-21: qty 1

## 2020-07-21 MED ORDER — IOHEXOL 350 MG/ML SOLN
100.0000 mL | Freq: Once | INTRAVENOUS | Status: AC | PRN
  Administered 2020-07-21: 100 mL via INTRAVENOUS

## 2020-07-21 MED ORDER — CARVEDILOL 12.5 MG PO TABS
25.0000 mg | ORAL_TABLET | Freq: Two times a day (BID) | ORAL | Status: DC
  Administered 2020-07-21: 25 mg via ORAL
  Filled 2020-07-21: qty 2

## 2020-07-21 MED ORDER — FUROSEMIDE 10 MG/ML IJ SOLN
40.0000 mg | Freq: Two times a day (BID) | INTRAMUSCULAR | Status: DC
  Administered 2020-07-21: 40 mg via INTRAVENOUS
  Filled 2020-07-21 (×2): qty 4

## 2020-07-21 MED ORDER — AMLODIPINE BESYLATE 5 MG PO TABS
10.0000 mg | ORAL_TABLET | Freq: Every day | ORAL | Status: DC
  Administered 2020-07-21: 10 mg via ORAL
  Filled 2020-07-21: qty 2

## 2020-07-21 MED ORDER — POTASSIUM CHLORIDE CRYS ER 20 MEQ PO TBCR
40.0000 meq | EXTENDED_RELEASE_TABLET | Freq: Two times a day (BID) | ORAL | Status: DC
  Administered 2020-07-21: 40 meq via ORAL
  Filled 2020-07-21: qty 2

## 2020-07-21 MED ORDER — LIVING BETTER WITH HEART FAILURE BOOK: Freq: Once | Status: AC

## 2020-07-21 MED ORDER — ATORVASTATIN CALCIUM 40 MG PO TABS: 80.0000 mg | ORAL_TABLET | Freq: Every day | ORAL | Status: DC

## 2020-07-21 MED ORDER — FUROSEMIDE 10 MG/ML IJ SOLN
40.0000 mg | Freq: Once | INTRAMUSCULAR | Status: AC
  Administered 2020-07-21: 40 mg via INTRAVENOUS
  Filled 2020-07-21: qty 4

## 2020-07-21 MED ORDER — ASPIRIN EC 81 MG PO TBEC
81.0000 mg | DELAYED_RELEASE_TABLET | Freq: Every day | ORAL | Status: DC
  Administered 2020-07-21: 81 mg via ORAL
  Filled 2020-07-21: qty 1

## 2020-07-21 MED ORDER — ACETAMINOPHEN 650 MG RE SUPP: 650.0000 mg | Freq: Four times a day (QID) | RECTAL | Status: DC | PRN

## 2020-07-21 MED ORDER — ONDANSETRON HCL 4 MG PO TABS: 4.0000 mg | ORAL_TABLET | Freq: Four times a day (QID) | ORAL | Status: DC | PRN

## 2020-07-21 MED ORDER — OXYCODONE HCL 5 MG PO TABS: 5.0000 mg | ORAL_TABLET | ORAL | Status: DC | PRN

## 2020-07-21 MED ORDER — POTASSIUM CHLORIDE CRYS ER 20 MEQ PO TBCR: 20.0000 meq | EXTENDED_RELEASE_TABLET | Freq: Every day | ORAL | 0 refills | Status: DC

## 2020-07-21 MED ORDER — VITAMIN B-12 1000 MCG PO TABS
500.0000 ug | ORAL_TABLET | Freq: Every day | ORAL | Status: DC
  Administered 2020-07-21: 500 ug via ORAL
  Filled 2020-07-21: qty 1

## 2020-07-21 MED ORDER — ALPRAZOLAM 0.5 MG PO TABS
0.5000 mg | ORAL_TABLET | Freq: Once | ORAL | Status: AC
  Administered 2020-07-21: 0.5 mg via ORAL
  Filled 2020-07-21: qty 1

## 2020-07-21 MED ORDER — FUROSEMIDE 20 MG PO TABS: 40.0000 mg | ORAL_TABLET | Freq: Every day | ORAL | 2 refills | Status: DC

## 2020-07-21 MED ORDER — FUROSEMIDE 20 MG PO TABS: 40.0000 mg | ORAL_TABLET | Freq: Every day | ORAL | 1 refills | Status: DC

## 2020-07-21 MED ORDER — ONDANSETRON HCL 4 MG/2ML IJ SOLN: 4.0000 mg | Freq: Four times a day (QID) | INTRAMUSCULAR | Status: DC | PRN

## 2020-07-21 NOTE — ED Notes (Signed)
Pt placed on purewick 

## 2020-07-21 NOTE — ED Notes (Signed)
Pt transported to CT at this time.

## 2020-07-21 NOTE — H&P (Signed)
TRH H&P    Patient Demographics:    Karen Tate, is a 75 y.o. female  MRN: [id number]  DOB - [birth year]  Admit Date - 07/20/2020  Referring MD/NP/PA: Stark Jock  Outpatient Primary MD for the patient is Neale Burly, MD  Patient coming from: Home  Chief complaint- dyspnea   HPI:    Karen Tate  is a 75 y.o. female, with history of type 2 diabetes mellitus, history of PE, hypertension, hyperlipidemia, ischemic cardiomyopathy, GERD, coronary artery disease, and more presents to the ED today with chief complaints of dyspnea.  She reports that it started 4 days ago.  Is been gradually worsening since onset.  She reports it is worse with exertion and better with rest.  She denies orthopnea.  She has an associated dry cough, but no chest pain, palpitations, or fevers.  She reports that she has been compliant with her Lasix at home.  She has had leg swelling, but reports that intermittent leg swelling is very normal for her.  She denies any urinary frequency or decrease in urine output.  She has as needed oxygen at home and tried it all day today without relief.  Patient does not smoke, does not drink, does not use illicit drugs.  She is vaccinated for COVID.  She is DNR.  In the ED Temperature 98.7, heart rate 63, respiratory rate 18, blood pressure 150/75, satting 96% on 2 L nasal cannula White blood cell count 6.6, hemoglobin 9.5 Chemistry panel unremarkable except for hyperglycemia of 157 BNP 330 Blood culture pending Chest x-ray showed possible pneumonia, but CT reads bilateral pleural effusions, partial resection of right lung, no significant pulmonary embolus EKG shows a heart rate of 81, sinus rhythm, minimally prolonged QT at 509 She was given 40 mg of Lasix and her home medication Xanax while she was in the ED    Review of systems:    In addition to the HPI above,  No Fever-chills, No Headache, No changes  with Vision or hearing, No problems swallowing food or Liquids, No Chest pain, admits to cough and shortness of breath No Abdominal pain, No Nausea or Vomiting, bowel movements are regular, No Blood in stool or Urine, No dysuria, No new skin rashes or bruises, No new joints pains-aches,  No new weakness, tingling, numbness in any extremity, No recent weight gain or loss, No polyuria, polydypsia or polyphagia, No significant Mental Stressors.  All other systems reviewed and are negative.    Past History of the following :    Past Medical History:  Diagnosis Date  . Anxiety neurosis   . Arthritis   . Asthma   . CAD (coronary artery disease)    a. 08/2017: abnormal nuc-> sent directly to [facility], NSTEMI, s/p CABG (left internal mammary artery to left anterior descending, sequential saphenous vein graft to ramus intermedius branches 1 and 2).  . Carotid artery disease ([place])   . Chronic anemia   . Dizziness   . GERD (gastroesophageal reflux disease)   . Glaucoma   . Heart disease   .  Hiatal hernia   . Hyperlipemia   . Hypertension   . Ischemic cardiomyopathy   . Joint pain   . Mitral regurgitation   . Postoperative atrial fibrillation ([place]) 08/2017  . Pulmonary embolus ([place])   . Type 2 diabetes mellitus ([place])       Past Surgical History:  Procedure Laterality Date  . BACK SURGERY  2016  . CORONARY ARTERY BYPASS GRAFT N/A 09/18/2017   Procedure: CORONARY ARTERY BYPASS GRAFTING (CABG);  Surgeon: [name], MD;  Location: [place];  Service: Open Heart Surgery;  Laterality: N/A;  Saphenous vein harvest. LIMA to LAD Saphenous vein (sequential) ramus 1 & 2  . EYE SURGERY Bilateral    "surgery for glaucoma"  . HERNIA REPAIR    . INTERCOSTAL NERVE BLOCK Right 05/13/2019   Procedure: Intercostal Nerve Block;  Surgeon: [name], MD;  Location: [facility];  Service: Thoracic;  Laterality: Right;  . LEFT HEART CATH AND CORONARY ANGIOGRAPHY N/A 09/15/2017   Procedure:  LEFT HEART CATH AND CORONARY ANGIOGRAPHY;  Surgeon: Lorretta Harp, MD;  Location: [facility];  Service: Cardiovascular;  Laterality: N/A;  . LYMPH NODE DISSECTION Right 05/13/2019   Procedure: Lymph Node Dissection;  Surgeon: [name], MD;  Location: [facility];  Service: Thoracic;  Laterality: Right;  . ROTATOR CUFF REPAIR Left    x2  . SHOULDER SURGERY     LEFT  . TEE WITHOUT CARDIOVERSION N/A 09/18/2017   Procedure: TRANSESOPHAGEAL ECHOCARDIOGRAM (TEE);  Surgeon: [name], MD;  Location: [facility];  Service: Open Heart Surgery;  Laterality: N/A;  . TOTAL KNEE ARTHROPLASTY     LEFT  . VAGINAL HYSTERECTOMY     partial      Social History:      Social History   Tobacco Use  . Smoking status: Former Smoker    Packs/day: 1.25    Years: 16.00    Pack years: 20.00    Types: Cigarettes    Quit date: 03/25/1978    Years since quitting: 42.3  . Smokeless tobacco: Never Used  . Tobacco comment: quit more than 30 years ago  Substance Use Topics  . Alcohol use: No       Family History :     Family History  Problem Relation Age of Onset  . Diabetes Father   . Heart disease Father   . Breast cancer Mother   . Congestive Heart Failure Brother   . Anemia Daughter   . Seizures Daughter   . Migraines Daughter   . Diabetes Daughter   . Hypertension Daughter   . Bladder Cancer Son   . Congestive Heart Failure Son   . Congestive Heart Failure Son       Home Medications:   Prior to Admission medications   Medication Sig Start Date End Date Taking? Authorizing Provider  acetaminophen (TYLENOL) 500 MG tablet Take 1,000 mg by mouth every 6 (six) hours as needed for moderate pain.     [provider]  ALPRAZolam Duanne Moron) 0.5 MG tablet Take 0.5 mg by mouth 2 (two) times daily.    [provider]  amLODipine (NORVASC) 10 MG tablet Take 10 mg by mouth daily.    [provider]  aspirin 81 MG tablet Take 1 tablet (81 mg total) by  mouth daily. 10/20/17   Nani Skillern, PA-C  atorvastatin (LIPITOR) 80 MG tablet Take 1 tablet (80 mg total) by mouth daily at 6 PM. 11/17/17  Herminio Commons, MD  carvedilol (COREG) 25 MG tablet Take 1 tablet by mouth 2 (two) times daily. 01/14/20   [provider]  doxycycline (VIBRA-TABS) 100 MG tablet Take 100 mg by mouth 2 (two) times daily. 02/08/20   [provider]  ferrous sulfate 325 (65 FE) MG tablet Take 325 mg by mouth 2 (two) times daily.    [provider]  fish oil-omega-3 fatty acids 1000 MG capsule Take 1 g by mouth 3 (three) times daily.     [provider]  furosemide (LASIX) 20 MG tablet Take 20 mg by mouth daily.    [provider]  glipiZIDE (GLUCOTROL) 5 MG tablet Take 5 mg by mouth 2 (two) times daily before a meal.    [provider]  ketoconazole (NIZORAL) 2 % cream SMARTSIG:1 Topical Every Night 09/30/19   [provider]  latanoprost (XALATAN) 0.005 % ophthalmic solution Place 1 drop into both eyes at bedtime. 02/10/19   [provider]  lisinopril (ZESTRIL) 40 MG tablet Take 1 tablet (40 mg total) by mouth daily. 01/31/20 04/30/20  Arnoldo Lenis, MD  magnesium oxide (MAG-OX) 400 (241.3 Mg) MG tablet Take 1 tablet by mouth 2 (two) times daily. 10/08/19   [provider]  magnesium oxide (MAG-OX) 400 MG tablet Take 1 tablet (400 mg total) by mouth 2 (two) times daily. Patient taking differently: Take 400 mg by mouth daily.  10/10/17   Dunn, Nedra Hai, PA-C  meclizine (ANTIVERT) 25 MG tablet Take 25 mg by mouth 3 (three) times daily as needed for dizziness.     [provider]  metFORMIN (GLUCOPHAGE) 1000 MG tablet Take 1,000 mg by mouth daily.     [provider]  Multiple Vitamins-Minerals (CENTRUM SILVER 50+WOMEN) TABS Take 1 tablet by mouth daily.     [provider]  omeprazole (PRILOSEC) 40 MG capsule Take 40 mg by mouth daily.    [provider]  potassium chloride SA (KLOR-CON) 20 MEQ tablet TAKE 2 TABLETS DAILY FOR 3 DAYS 01/31/20   Branch, Alphonse Guild, MD  tiZANidine (ZANAFLEX) 4 MG tablet Take 1 tablet (4 mg total) by mouth every 6 (six) hours as needed for muscle spasms. 10/02/14   Ashok Pall, MD  vitamin B-12 (CYANOCOBALAMIN) 500 MCG tablet Take 500 mcg by mouth daily.    [provider]     Allergies:     Allergies  Allergen Reactions  . Beta Adrenergic Blockers Other (See Comments)    Dropped heart rate to the 40's  . Calcium Channel Blockers Other (See Comments)    Dropped HR into the 40's  . Naproxen Hives     Physical Exam:   Vitals  Blood pressure 136/61, pulse 91, temperature 98.2 F (36.8 C), temperature source Oral, resp. rate 18, height 5\' 5"  (1.651 m), weight 85.7 kg, SpO2 100 %.  1.  General: Patient sitting on bedside commode, no acute distress  2. Psychiatric: Mood and behavior normal for situation, pleasant and cooperative with exam, oriented x3  3. Neurologic: Face is symmetric, speech and language normal, moves all 4 extremities voluntarily, no acute deficit on limited exam  4. HEENMT:  Head is atraumatic, normocephalic, pupils reactive, neck is supple, trachea is midline, mucous membranes are moist  5. Respiratory : Fine crackles in the lower lung fields, no wheezes or rhonchi, no increased work of breathing,   6. Cardiovascular : Heart rate is normal, rhythm is regular, no murmurs rubs or  gallops,minimal JVD, no peripheral edema  7. Gastrointestinal:  Abdomen is soft, nondistended, nontender to palpation, no palpable masses, no suprapubic tenderness  8. Skin:  Skin is warm dry and intact on limited exam  9.Musculoskeletal:  No acute deformity, no calf tenderness, peripheral pulses palpated    Data Review:    CBC Recent Labs  Lab 07/20/20 2130  WBC 6.6  HGB 9.5*  HCT 30.4*  PLT 297  MCV 99.0  MCH 30.9  MCHC 31.3  RDW 14.6  LYMPHSABS 1.3  MONOABS 0.5   EOSABS 0.1  BASOSABS 0.0   ------------------------------------------------------------------------------------------------------------------  Results for orders placed or performed during the hospital encounter of 07/20/20 (from the past 48 hour(s))  CBC with Differential/Platelet     Status: Abnormal   Collection Time: 07/20/20  9:30 PM  Result Value Ref Range   WBC 6.6 4.0 - 10.5 K/uL   RBC 3.07 (L) 3.87 - 5.11 MIL/uL   Hemoglobin 9.5 (L) 12.0 - 15.0 g/dL   HCT 30.4 (L) 36.0 - 46.0 %   MCV 99.0 80.0 - 100.0 fL   MCH 30.9 26.0 - 34.0 pg   MCHC 31.3 30.0 - 36.0 g/dL   RDW 14.6 11.5 - 15.5 %   Platelets 297 150 - 400 K/uL   nRBC 0.0 0.0 - 0.2 %   Neutrophils Relative % 69 %   Neutro Abs 4.6 1.7 - 7.7 K/uL   Lymphocytes Relative 20 %   Lymphs Abs 1.3 0.7 - 4.0 K/uL   Monocytes Relative 8 %   Monocytes Absolute 0.5 0.1 - 1.0 K/uL   Eosinophils Relative 1 %   Eosinophils Absolute 0.1 0.0 - 0.5 K/uL   Basophils Relative 1 %   Basophils Absolute 0.0 0.0 - 0.1 K/uL   Immature Granulocytes 1 %   Abs Immature Granulocytes 0.03 0.00 - 0.07 K/uL    Comment: Performed at [facility], [address], [place], [place] [address]  Comprehensive metabolic panel     Status: Abnormal   Collection Time: 07/20/20  9:30 PM  Result Value Ref Range   Sodium 142 135 - 145 mmol/L   Potassium 3.6 3.5 - 5.1 mmol/L   Chloride 105 98 - 111 mmol/L   CO2 29 22 - 32 mmol/L   Glucose, Bld 157 (H) 70 - 99 mg/dL    Comment: Glucose reference range applies only to samples taken after fasting for at least 8 hours.   BUN 21 8 - 23 mg/dL   Creatinine, Ser 1.11 (H) 0.44 - 1.00 mg/dL   Calcium 9.1 8.9 - 10.3 mg/dL   Total Protein 7.1 6.5 - 8.1 g/dL   Albumin 3.2 (L) 3.5 - 5.0 g/dL   AST 20 15 - 41 U/L   ALT 35 0 - 44 U/L   Alkaline Phosphatase 74 38 - 126 U/L   Total Bilirubin 0.8 0.3 - 1.2 mg/dL   GFR, Estimated 52 (L) >60 mL/min    Comment: (NOTE) Calculated using the CKD-EPI Creatinine Equation  (2021)    Anion gap 8 5 - 15    Comment: Performed at [facility], [address], [place], [place] [address]  Brain natriuretic peptide     Status: Abnormal   Collection Time: 07/20/20  9:30 PM  Result Value Ref Range   B Natriuretic Peptide 330.0 (H) 0.0 - 100.0 pg/mL    Comment: Performed at [facility], [address] [place], [place] [address]  Blood culture (routine x 2)     Status:  None (Preliminary result)   Collection Time: 07/20/20  9:30 PM   Specimen: BLOOD RIGHT HAND  Result Value Ref Range   Specimen Description BLOOD RIGHT HAND    Special Requests      BOTTLES DRAWN AEROBIC AND ANAEROBIC Blood Culture adequate volume Performed at [facility], [address], [place], [place] [address]    Culture PENDING    Report Status PENDING   Troponin I (High Sensitivity)     Status: None   Collection Time: 07/20/20  9:30 PM  Result Value Ref Range   Troponin I (High Sensitivity) 9 <18 ng/L    Comment: (NOTE) Elevated high sensitivity troponin I (hsTnI) values and significant  changes across serial measurements may suggest ACS but many other  chronic and acute conditions are known to elevate hsTnI results.  Refer to the "Links" section for chest pain algorithms and additional  guidance. Performed at [facility], [address] [place], [place] [address]   Blood culture (routine x 2)     Status: None (Preliminary result)   Collection Time: 07/20/20  9:35 PM   Specimen: Left Antecubital; Blood  Result Value Ref Range   Specimen Description LEFT ANTECUBITAL    Special Requests      BOTTLES DRAWN AEROBIC AND ANAEROBIC Blood Culture adequate volume Performed at [facility], [address] [place], [place] [address]    Culture PENDING    Report Status PENDING   Resp Panel by RT-PCR (Flu A&B, Covid) Nasopharyngeal Swab     Status: None   Collection Time: 07/21/20  2:13 AM   Specimen: Nasopharyngeal Swab; Nasopharyngeal(NP) swabs in vial transport medium  Result Value Ref  Range   SARS Coronavirus 2 by RT PCR NEGATIVE NEGATIVE    Comment: (NOTE) SARS-CoV-2 target nucleic acids are NOT DETECTED.  The SARS-CoV-2 RNA is generally detectable in upper respiratory specimens during the acute phase of infection. The lowest concentration of SARS-CoV-2 viral copies this assay can detect is 138 copies/mL. A negative result does not preclude SARS-Cov-2 infection and should not be used as the sole basis for treatment or other patient management decisions. A negative result may occur with  improper specimen collection/handling, submission of specimen other than nasopharyngeal swab, presence of viral mutation(s) within the areas targeted by this assay, and inadequate number of viral copies(<138 copies/mL). A negative result must be combined with clinical observations, patient history, and epidemiological information. The expected result is Negative.  Fact Sheet for Patients:  EntrepreneurPulse.com.au  Fact Sheet for Healthcare Providers:  IncredibleEmployment.be  This test is no t yet approved or cleared by the [place] FDA and  has been authorized for detection and/or diagnosis of SARS-CoV-2 by FDA under an Emergency Use Authorization (EUA). This EUA will remain  in effect (meaning this test can be used) for the duration of the COVID-19 declaration under Section 564(b)(1) of the Act, 21 U.S.C.section 360bbb-3(b)(1), unless the authorization is terminated  or revoked sooner.       Influenza A by PCR NEGATIVE NEGATIVE   Influenza B by PCR NEGATIVE NEGATIVE    Comment: (NOTE) The Xpert Xpress SARS-CoV-2/FLU/RSV plus assay is intended as an aid in the diagnosis of influenza from Nasopharyngeal swab specimens and should not be used as a sole basis for treatment. Nasal washings and aspirates are unacceptable for Xpert Xpress SARS-CoV-2/FLU/RSV testing.  Fact Sheet for  Patients: EntrepreneurPulse.com.au  Fact Sheet for Healthcare Providers: IncredibleEmployment.be  This test is not yet approved or cleared by the [place]  FDA and has been authorized for detection and/or diagnosis of SARS-CoV-2 by FDA under an Emergency Use Authorization (EUA). This EUA will remain in effect (meaning this test can be used) for the duration of the COVID-19 declaration under Section 564(b)(1) of the Act, 21 U.S.C. section 360bbb-3(b)(1), unless the authorization is terminated or revoked.  Performed at [facility], [address], [place], [place] [address]     Chemistries  Recent Labs  Lab 07/20/20 2130  NA 142  K 3.6  CL 105  CO2 29  GLUCOSE 157*  BUN 21  CREATININE 1.11*  CALCIUM 9.1  AST 20  ALT 35  ALKPHOS 74  BILITOT 0.8   ------------------------------------------------------------------------------------------------------------------  ------------------------------------------------------------------------------------------------------------------ GFR: Estimated Creatinine Clearance: 46.6 mL/min (A) (by C-G formula based on SCr of 1.11 mg/dL (H)). Liver Function Tests: Recent Labs  Lab 07/20/20 2130  AST 20  ALT 35  ALKPHOS 74  BILITOT 0.8  PROT 7.1  ALBUMIN 3.2*   No results for input(s): LIPASE, AMYLASE in the last 168 hours. No results for input(s): AMMONIA in the last 168 hours. Coagulation Profile: No results for input(s): INR, PROTIME in the last 168 hours. Cardiac Enzymes: No results for input(s): CKTOTAL, CKMB, CKMBINDEX, TROPONINI in the last 168 hours. BNP (last 3 results) No results for input(s): PROBNP in the last 8760 hours. HbA1C: No results for input(s): HGBA1C in the last 72 hours. CBG: No results for input(s): GLUCAP in the last 168 hours. Lipid Profile: No results for input(s): CHOL, HDL, LDLCALC, TRIG, CHOLHDL, LDLDIRECT in the last 72 hours. Thyroid Function Tests: No  results for input(s): TSH, T4TOTAL, FREET4, T3FREE, THYROIDAB in the last 72 hours. Anemia Panel: No results for input(s): VITAMINB12, FOLATE, FERRITIN, TIBC, IRON, RETICCTPCT in the last 72 hours.  --------------------------------------------------------------------------------------------------------------- Urine analysis:    Component Value Date/Time   COLORURINE STRAW (A) 02/14/2020 1655   APPEARANCEUR CLEAR 02/14/2020 1655   LABSPEC 1.017 02/14/2020 1655   PHURINE 8.0 02/14/2020 1655   GLUCOSEU NEGATIVE 02/14/2020 1655   HGBUR NEGATIVE 02/14/2020 1655   BILIRUBINUR NEGATIVE 02/14/2020 1655   KETONESUR NEGATIVE 02/14/2020 1655   PROTEINUR NEGATIVE 02/14/2020 1655   NITRITE NEGATIVE 02/14/2020 1655   LEUKOCYTESUR NEGATIVE 02/14/2020 1655      Imaging Results:    CT ANGIO CHEST PE W OR WO CONTRAST  Result Date: 07/21/2020 CLINICAL DATA:  Chest pain and shortness of breath. Prescribed antibiotics on Tuesday. EXAM: CT ANGIOGRAPHY CHEST WITH CONTRAST TECHNIQUE: Multidetector CT imaging of the chest was performed using the standard protocol during bolus administration of intravenous contrast. Multiplanar CT image reconstructions and MIPs were obtained to evaluate the vascular anatomy. CONTRAST:  168mL OMNIPAQUE IOHEXOL 350 MG/ML SOLN COMPARISON:  05/04/2020 FINDINGS: Cardiovascular: Good opacification of the central and segmental pulmonary arteries. No focal filling defects. No evidence of significant pulmonary embolus. Diffuse cardiac enlargement. No pericardial effusions. Reflux of contrast material into the hepatic veins consistent with right heart failure. Normal caliber thoracic aorta. No dissection. Calcification of the aorta. Postoperative changes consistent with coronary bypass. Mediastinum/Nodes: Mediastinal lymph nodes are mildly enlarged, similar to prior study, nonspecific. Esophagus is decompressed. Lungs/Pleura: Small bilateral pleural effusions. Diffuse airspace disease  throughout the lungs with mild interstitial pattern likely representing edema. Postoperative changes in the right lung with partial resection. No definite evidence of residual or recurrent mass. Upper Abdomen: No acute abnormalities demonstrated in the visualized upper abdomen. Musculoskeletal: Degenerative changes in the spine. No destructive bone lesions. Sternotomy wires. Review of the MIP images confirms  the above findings. IMPRESSION: 1. No evidence of significant pulmonary embolus. 2. Diffuse cardiac enlargement with evidence of right heart failure. 3. Small bilateral pleural effusions with diffuse airspace disease and interstitial pattern likely representing edema. 4. Postoperative changes in the right lung with partial resection. No definite evidence of residual or recurrent mass. 5. Nonspecific mild mediastinal lymphadenopathy. 6. Aortic atherosclerosis. Aortic Atherosclerosis (ICD10-I70.0). Electronically Signed   By: Lucienne Capers M.D.   On: 07/21/2020 00:54   DG Chest Port 1 View  Result Date: 07/20/2020 CLINICAL DATA:  Shortness of breath. EXAM: PORTABLE CHEST 1 VIEW COMPARISON:  05/09/2020, CT 05/04/2020 FINDINGS: Post median sternotomy. Mild cardiomegaly. Right pleural effusion and basilar opacity, new from prior exam. There is mild chronic elevation of right hemidiaphragm, which may be postsurgical. Mild chronic bronchial thickening. No acute left lung airspace disease. No pneumothorax. IMPRESSION: 1. Right pleural effusion and basilar opacity, new from prior exam, may represent pneumonia with parapneumonic effusion or effusion with adjacent atelectasis. 2. Mild cardiomegaly. Electronically Signed   By: Keith Rake M.D.   On: 07/20/2020 21:29    My personal review of EKG: Rhythm NSR, Rate 81 /min, QTc509,no Acute ST changes   Assessment & Plan:    Active Problems:   DMII (diabetes mellitus, type 2) (HCC)   CHF exacerbation (HCC)   Acute on chronic respiratory failure with  hypoxia (HCC)   Mild protein-calorie malnutrition ([place])   1. Acute on chronic respiratory failure with hypoxia 1. O2 sat down to 87% with ambulation 2. 2/2 CHF 3. Continue PRN O2 supplementation 4. CTA ruled out PE and pneumonia 5. Continue treatment as below 2. CHF 1. With BL pleural effusions, dyspnea, and hypoxia with ambulation 2. BNP 330 3. Daily weights, strict Intake and output, fluid restriction, low sodium diet 3. Mild protein cal mal 1. Advised on nutrient dense food choices 2. Continue to monitor 4. Prolonged QT 1. Monitor on tele 5.    DVT Prophylaxis-   heparin - SCDs   AM Labs Ordered, also please review Full Orders  Family Communication: Admission, patients condition and plan of care including tests being ordered have been discussed with the patient and gentleman at bedside who indicate understanding and agree with the plan and Code Status.  Code Status:  DNR  Admission status: Observation Time spent in minutes :[place]

## 2020-07-21 NOTE — ED Notes (Signed)
Pt ambulated around room with 1 person assist. Pt became SOB initially and then lightheaded. O2 was 87% on room air. Pt was placed on 3L [place] and O2 is now 100%.

## 2020-07-21 NOTE — ED Notes (Signed)
Pt assisted to bedside commode.   Pt provided with shasta sprite. No other requests at this time. Call bell within reach, bed in low position. Will continue to monitor.

## 2020-07-21 NOTE — Progress Notes (Signed)
  Echocardiogram 2D Echocardiogram has been performed.  Karen Tate 07/21/2020, 10:58 AM

## 2020-07-21 NOTE — ED Notes (Signed)
Pt returned from CT at this time.  

## 2020-07-21 NOTE — TOC Transition Note (Signed)
Transition of Care [facility]) - CM/SW Discharge Note   Patient Details  Name: Karen Tate MRN: [id number] Date of Birth: [birth year]  [facility]) CM/SW Contact:  Boneta Lucks, RN Phone Number: 07/21/2020, 12:33 PM   Clinical Narrative:   Patient discharging home today. TOC consulted for CHF. Patient lives at home with her husband. She has a scooter, wheelchair and cane. She weighs herself daily, does not follow a healthy diet. RN to take living better with CHF book to the room. Patient is interested and will review with her family and plans to do better with her diet. No other needs identified.    Final next level of care: Home/Self Care Barriers to Discharge: Barriers Resolved   Patient Goals and CMS Choice Patient states their goals for this hospitalization and ongoing recovery are:: to go home. CMS Medicare.gov Compare Post Acute Care list provided to:: Patient Choice offered to / list presented to : Patient  Discharge Placement        Patient and family notified of of transfer: 07/21/20  Discharge Plan and [place]

## 2020-07-21 NOTE — Discharge Summary (Addendum)
Physician Discharge Summary [facility]    Patient: Karen Tate                   Admit date: 07/20/2020   DOB: May 07, 1945             Discharge date:07/21/2020/12:25 PM VOH:[id number]                          PCP: Neale Burly, MD  Disposition: HOME   Recommendations for Outpatient Follow-up:   . Follow up: With PCP within 1 to 2 weeks. . Note to PCP continue monitoring heart rate, daily oxygen demand.  Her Lasix has been increased from 20 to 40 mg daily . To referral to heart failure team and follow-up  Discharge Condition: Stable   Code Status:   Code Status: DNR  Diet recommendation: Cardiac diet   Discharge Diagnoses:    Active Problems:   DMII (diabetes mellitus, type 2) (HCC)   CHF exacerbation (HCC)   Acute on chronic respiratory failure with hypoxia (HCC)   Mild protein-calorie malnutrition ([place])   History of Present Illness/ Hospital Course Karen Tate Summary:    [name]  is a 75 y.o. female, with history of type 2 diabetes mellitus, history of PE, hypertension, hyperlipidemia, ischemic cardiomyopathy, GERD, coronary artery disease, and more presents to the ED today with chief complaints of dyspnea.  She reports that it started 4 days ago.  Is been gradually worsening since onset.  She reports it is worse with exertion and better with rest.  She denies orthopnea.  She has an associated dry cough, but no chest pain, palpitations, or fevers.  She reports that she has been compliant with her Lasix at home.  She has had leg swelling, but reports that intermittent leg swelling is very normal for her.  She denies any urinary frequency or decrease in urine output.  She has as needed oxygen at home and tried it all day today without relief.  Patient does not smoke, does not drink, does not use illicit drugs.  She is vaccinated for COVID.  She is DNR.  In the ED Temperature 98.7, heart rate 63, respiratory rate 18, blood pressure 150/75, satting 96% on 2 L  nasal cannula White blood cell count 6.6, hemoglobin 9.5 Chemistry panel unremarkable except for hyperglycemia of 157 BNP 330 Blood culture pending Chest x-ray showed possible pneumonia, but CT reads bilateral pleural effusions, partial resection of right lung, no significant pulmonary embolus EKG shows a heart rate of 81, sinus rhythm, minimally prolonged QT at 509 She was given 40 mg of Lasix and her home medication Xanax while she was in the ED  Hospital course: Acute on chronic respiratory failure Patient was subsequently admitted for acute on chronic respiratory failure.  Patient is on supplemental oxygen as needed at home She was placed back on supplemental oxygen 2 L remained to sat 100% throughout her hospital course. It was thought that her respiratory failure was exacerbated by CHF exacerbation, as her BNP was elevated to 330, she admitted progressively gaining weight. CTA ruled out PE and pneumonia 2D echocardiogram 07/21/2020 >>> reviewed  -She was monitored closely on telemetry bed, treated with IV Lasix x3 doses -Patient is back to baseline, much improved shortness of breath, hypoxia -Currently satting 100% on 2 L of oxygen  Disposition: Patient was cleared to be discharged home.          Lasix was increased from  20 to 40 mg daily          She is instructed to weigh herself daily, continue wearing her oxygen          With any progressive shortness of breath, edema, increase weight 3 to 5 pounds she may take           extra dose of Lasix     Was follow-up with her PCP, cardiologist recommended.    Referral to heart failure team was made.    Code Status:  DNR  Risk of unplanned readmission Score:  Moderate   Discharge Instructions:   Discharge Instructions    (HEART FAILURE PATIENTS) Call MD:  Anytime you have any of the following symptoms: 1) 3 pound weight gain in 24 hours or 5 pounds in 1 week 2) shortness of breath, with or without a dry hacking cough 3) swelling  in the hands, feet or stomach 4) if you have to sleep on extra pillows at night in order to breathe.   Complete by: As directed    AMB Referral to Phase II Cardiac Rehab   Complete by: As directed    Diagnosis: Heart Failure (see criteria below if ordering Phase II)   Heart Failure Type: Chronic Systolic   After initial evaluation and assessments completed: Virtual Based Care may be provided alone or in conjunction with Phase 2 Cardiac Rehab based on patient barriers.: Yes   AMB referral to CHF clinic   Complete by: As directed    Activity as tolerated - No restrictions   Complete by: As directed    Call MD for:  difficulty breathing, headache or visual disturbances   Complete by: As directed    Diet - low sodium heart healthy   Complete by: As directed    Discharge instructions   Complete by: As directed    We increased your diuretic Lasix from 20 to 40 mg daily, wear your oxygen as needed Please weigh yourself daily if any increase between 3 to 5 pounds may take an extra dose of Lasix and notify your physician.  We recommend close follow-up with a cardiologist and congestive heart failure team. Overall reduce your salt intake....   Increase activity slowly   Complete by: As directed        Medication List    TAKE these medications   acetaminophen 500 MG tablet Commonly known as: TYLENOL Take 1,000 mg by mouth every 6 (six) hours as needed for moderate pain.   albuterol 108 (90 Base) MCG/ACT inhaler Commonly known as: VENTOLIN HFA Inhale 2 puffs into the lungs every 6 (six) hours as needed.   albuterol 0.63 MG/3ML nebulizer solution Commonly known as: ACCUNEB Take 3 mLs by nebulization 4 (four) times daily.   ALPRAZolam 0.5 MG tablet Commonly known as: XANAX Take 0.5 mg by mouth 2 (two) times daily.   amLODipine 10 MG tablet Commonly known as: NORVASC Take 10 mg by mouth daily.   aspirin EC 81 MG tablet Take 1 tablet (81 mg total) by mouth daily.   atorvastatin 80  MG tablet Commonly known as: LIPITOR Take 1 tablet (80 mg total) by mouth daily at 6 PM.   carvedilol 25 MG tablet Commonly known as: COREG Take 1 tablet by mouth 2 (two) times daily.   Centrum Silver 50+Women Tabs Take 1 tablet by mouth daily.   cholecalciferol 25 MCG (1000 UNIT) tablet Commonly known as: VITAMIN D3 Take 1,000 Units by mouth daily.   doxycycline 100  MG tablet Commonly known as: VIBRA-TABS Take 100 mg by mouth 2 (two) times daily.   ferrous sulfate 325 (65 FE) MG tablet Take 325 mg by mouth 2 (two) times daily.   fish oil-omega-3 fatty acids 1000 MG capsule Take 1 g by mouth 3 (three) times daily.   furosemide 20 MG tablet Commonly known as: LASIX Take 2 tablets (40 mg total) by mouth daily. What changed: how much to take   latanoprost 0.005 % ophthalmic solution Commonly known as: XALATAN Place 1 drop into both eyes at bedtime.   lisinopril 40 MG tablet Commonly known as: ZESTRIL Take 1 tablet (40 mg total) by mouth daily.   magnesium oxide 400 MG tablet Commonly known as: MAG-OX Take 1 tablet (400 mg total) by mouth 2 (two) times daily. What changed: when to take this   meclizine 25 MG tablet Commonly known as: ANTIVERT Take 25 mg by mouth 3 (three) times daily as needed for dizziness.   metFORMIN 1000 MG tablet Commonly known as: GLUCOPHAGE Take 1,000 mg by mouth daily.   omeprazole 40 MG capsule Commonly known as: PRILOSEC Take 40 mg by mouth daily.   potassium chloride SA 20 MEQ tablet Commonly known as: KLOR-CON TAKE 2 TABLETS DAILY FOR 3 DAYS What changed:   how much to take  when to take this   tiZANidine 4 MG tablet Commonly known as: ZANAFLEX Take 1 tablet (4 mg total) by mouth every 6 (six) hours as needed for muscle spasms.   vitamin B-12 500 MCG tablet Commonly known as: CYANOCOBALAMIN Take 500 mcg by mouth daily.       Allergies  Allergen Reactions  . Beta Adrenergic Blockers Other (See Comments)    Dropped  heart rate to the 40's  . Calcium Channel Blockers Other (See Comments)    Dropped HR into the 40's  . Naproxen Hives     Procedures /Studies:   CT ANGIO CHEST PE W OR WO CONTRAST  Result Date: 07/21/2020 CLINICAL DATA:  Chest pain and shortness of breath. Prescribed antibiotics on Tuesday. EXAM: CT ANGIOGRAPHY CHEST WITH CONTRAST TECHNIQUE: Multidetector CT imaging of the chest was performed using the standard protocol during bolus administration of intravenous contrast. Multiplanar CT image reconstructions and MIPs were obtained to evaluate the vascular anatomy. CONTRAST:  150mL OMNIPAQUE IOHEXOL 350 MG/ML SOLN COMPARISON:  05/04/2020 FINDINGS: Cardiovascular: Good opacification of the central and segmental pulmonary arteries. No focal filling defects. No evidence of significant pulmonary embolus. Diffuse cardiac enlargement. No pericardial effusions. Reflux of contrast material into the hepatic veins consistent with right heart failure. Normal caliber thoracic aorta. No dissection. Calcification of the aorta. Postoperative changes consistent with coronary bypass. Mediastinum/Nodes: Mediastinal lymph nodes are mildly enlarged, similar to prior study, nonspecific. Esophagus is decompressed. Lungs/Pleura: Small bilateral pleural effusions. Diffuse airspace disease throughout the lungs with mild interstitial pattern likely representing edema. Postoperative changes in the right lung with partial resection. No definite evidence of residual or recurrent mass. Upper Abdomen: No acute abnormalities demonstrated in the visualized upper abdomen. Musculoskeletal: Degenerative changes in the spine. No destructive bone lesions. Sternotomy wires. Review of the MIP images confirms the above findings. IMPRESSION: 1. No evidence of significant pulmonary embolus. 2. Diffuse cardiac enlargement with evidence of right heart failure. 3. Small bilateral pleural effusions with diffuse airspace disease and interstitial pattern  likely representing edema. 4. Postoperative changes in the right lung with partial resection. No definite evidence of residual or recurrent mass. 5. Nonspecific mild mediastinal lymphadenopathy. 6.  Aortic atherosclerosis. Aortic Atherosclerosis (ICD10-I70.0). Electronically Signed   By: Lucienne Capers M.D.   On: 07/21/2020 00:54   DG Chest Port 1 View  Result Date: 07/20/2020 CLINICAL DATA:  Shortness of breath. EXAM: PORTABLE CHEST 1 VIEW COMPARISON:  05/09/2020, CT 05/04/2020 FINDINGS: Post median sternotomy. Mild cardiomegaly. Right pleural effusion and basilar opacity, new from prior exam. There is mild chronic elevation of right hemidiaphragm, which may be postsurgical. Mild chronic bronchial thickening. No acute left lung airspace disease. No pneumothorax. IMPRESSION: 1. Right pleural effusion and basilar opacity, new from prior exam, may represent pneumonia with parapneumonic effusion or effusion with adjacent atelectasis. 2. Mild cardiomegaly. Electronically Signed   By: Keith Rake M.D.   On: 07/20/2020 21:29    Subjective:   Patient was seen and examined 07/21/2020, 12:25 PM Patient stable today. No acute distress.  No issues overnight Stable for discharge.  Discharge Exam:    Vitals:   07/21/20 0100 07/21/20 0130 07/21/20 0258 07/21/20 0500  BP: (!) 161/63 (!) 150/75 136/61   Pulse: 84 88 91   Resp: 19 18 18    Temp:   98.2 F (36.8 C)   TempSrc:   Oral   SpO2: 95% 96% 100%   Weight:    84.6 kg  Height:        General: Pt lying comfortably in bed & appears in no obvious distress. Cardiovascular: S1 & S2 heard, RRR, S1/S2 +. No murmurs, rubs, gallops or clicks. No JVD or pedal edema. Respiratory: Clear to auscultation without wheezing, rhonchi or crackles. No increased work of breathing. Abdominal:  Non-distended, non-tender & soft. No organomegaly or masses appreciated. Normal bowel sounds heard. CNS: Alert and oriented. No focal deficits. Extremities: no edema, no  cyanosis      The results of significant diagnostics from this hospitalization (including imaging, microbiology, ancillary and laboratory) are listed below for reference.      Microbiology:   Recent Results (from the past 240 hour(s))  Blood culture (routine x 2)     Status: None (Preliminary result)   Collection Time: 07/20/20  9:30 PM   Specimen: BLOOD RIGHT HAND  Result Value Ref Range Status   Specimen Description BLOOD RIGHT HAND  Final   Special Requests   Final    BOTTLES DRAWN AEROBIC AND ANAEROBIC Blood Culture adequate volume   Culture   Final    NO GROWTH < 12 HOURS Performed at [facility], [address] [place], [place] [address]    Report Status PENDING  Incomplete  Blood culture (routine x 2)     Status: None (Preliminary result)   Collection Time: 07/20/20  9:35 PM   Specimen: Left Antecubital; Blood  Result Value Ref Range Status   Specimen Description LEFT ANTECUBITAL  Final   Special Requests   Final    BOTTLES DRAWN AEROBIC AND ANAEROBIC Blood Culture adequate volume   Culture   Final    NO GROWTH < 12 HOURS Performed at [facility], [address] [place], [place] [address]    Report Status PENDING  Incomplete  Resp Panel by RT-PCR (Flu A&B, Covid) Nasopharyngeal Swab     Status: None   Collection Time: 07/21/20  2:13 AM   Specimen: Nasopharyngeal Swab; Nasopharyngeal(NP) swabs in vial transport medium  Result Value Ref Range Status   SARS Coronavirus 2 by RT PCR NEGATIVE NEGATIVE Final    Comment: (NOTE) SARS-CoV-2 target nucleic acids are NOT DETECTED.  The SARS-CoV-2 RNA is generally detectable in  upper respiratory specimens during the acute phase of infection. The lowest concentration of SARS-CoV-2 viral copies this assay can detect is 138 copies/mL. A negative result does not preclude SARS-Cov-2 infection and should not be used as the sole basis for treatment or other patient management decisions. A negative result may occur with   improper specimen collection/handling, submission of specimen other than nasopharyngeal swab, presence of viral mutation(s) within the areas targeted by this assay, and inadequate number of viral copies(<138 copies/mL). A negative result must be combined with clinical observations, patient history, and epidemiological information. The expected result is Negative.  Fact Sheet for Patients:  EntrepreneurPulse.com.au  Fact Sheet for Healthcare Providers:  IncredibleEmployment.be  This test is no t yet approved or cleared by the [place] FDA and  has been authorized for detection and/or diagnosis of SARS-CoV-2 by FDA under an Emergency Use Authorization (EUA). This EUA will remain  in effect (meaning this test can be used) for the duration of the COVID-19 declaration under Section 564(b)(1) of the Act, 21 U.S.C.section 360bbb-3(b)(1), unless the authorization is terminated  or revoked sooner.       Influenza A by PCR NEGATIVE NEGATIVE Final   Influenza B by PCR NEGATIVE NEGATIVE Final    Comment: (NOTE) The Xpert Xpress SARS-CoV-2/FLU/RSV plus assay is intended as an aid in the diagnosis of influenza from Nasopharyngeal swab specimens and should not be used as a sole basis for treatment. Nasal washings and aspirates are unacceptable for Xpert Xpress SARS-CoV-2/FLU/RSV testing.  Fact Sheet for Patients: EntrepreneurPulse.com.au  Fact Sheet for Healthcare Providers: IncredibleEmployment.be  This test is not yet approved or cleared by the [place] FDA and has been authorized for detection and/or diagnosis of SARS-CoV-2 by FDA under an Emergency Use Authorization (EUA). This EUA will remain in effect (meaning this test can be used) for the duration of the COVID-19 declaration under Section 564(b)(1) of the Act, 21 U.S.C. section 360bbb-3(b)(1), unless the authorization is terminated  or revoked.  Performed at [facility], [address], [place], [place] [address]      Labs:   CBC: Recent Labs  Lab 07/20/20 2130 07/21/20 0445  WBC 6.6 6.4  NEUTROABS 4.6 4.5  HGB 9.5* 9.5*  HCT 30.4* 30.1*  MCV 99.0 98.7  PLT 297 937   Basic Metabolic Panel: Recent Labs  Lab 07/20/20 2130 07/21/20 0445  NA 142 142  K 3.6 3.2*  CL 105 103  CO2 29 30  GLUCOSE 157* 161*  BUN 21 18  CREATININE 1.11* 0.99  CALCIUM 9.1 8.9  MG  --  1.4*   Liver Function Tests: Recent Labs  Lab 07/20/20 2130 07/21/20 0445  AST 20 15  ALT 35 30  ALKPHOS 74 69  BILITOT 0.8 1.1  PROT 7.1 6.7  ALBUMIN 3.2* 3.2*   BNP (last 3 results) Recent Labs    07/20/20 2130 07/21/20 0445  BNP 330.0* 324.0*   Cardiac Enzymes: No results for input(s): CKTOTAL, CKMB, CKMBINDEX, TROPONINI in the last 168 hours. CBG: Recent Labs  Lab 07/21/20 0737 07/21/20 1110  GLUCAP 113* 181*   Hgb A1c Recent Labs    07/21/20 0445  HGBA1C 7.0*   Lipid Profile No results for input(s): CHOL, HDL, LDLCALC, TRIG, CHOLHDL, LDLDIRECT in the last 72 hours. Thyroid function studies No results for input(s): TSH, T4TOTAL, T3FREE, THYROIDAB in the last 72 hours.  Invalid input(s): FREET3 Anemia work up No results for input(s): VITAMINB12, FOLATE, FERRITIN, TIBC, IRON, RETICCTPCT in the last 11  hours. Urinalysis    Component Value Date/Time   COLORURINE STRAW (A) 02/14/2020 1655   APPEARANCEUR CLEAR 02/14/2020 1655   LABSPEC 1.017 02/14/2020 1655   PHURINE 8.0 02/14/2020 1655   GLUCOSEU NEGATIVE 02/14/2020 1655   HGBUR NEGATIVE 02/14/2020 1655   BILIRUBINUR NEGATIVE 02/14/2020 1655   KETONESUR NEGATIVE 02/14/2020 1655   PROTEINUR NEGATIVE 02/14/2020 1655   NITRITE NEGATIVE 02/14/2020 1655   LEUKOCYTESUR NEGATIVE 02/14/2020 1655         Time coordinating discharge: Over 45 minutes  SIGNED: Deatra James, MD, FACP, FHM. [facility],  Please use [email or web address] to Page If 7PM-7AM,  please contact night-coverage [email or web address] 07/21/2020, 12:25 PM

## 2020-07-22 DIAGNOSIS — E1143 Type 2 diabetes mellitus with diabetic autonomic (poly)neuropathy: Secondary | ICD-10-CM | POA: Diagnosis not present

## 2020-07-22 DIAGNOSIS — J449 Chronic obstructive pulmonary disease, unspecified: Secondary | ICD-10-CM | POA: Diagnosis not present

## 2020-07-22 DIAGNOSIS — I10 Essential (primary) hypertension: Secondary | ICD-10-CM | POA: Diagnosis not present

## 2020-07-22 DIAGNOSIS — E7849 Other hyperlipidemia: Secondary | ICD-10-CM | POA: Diagnosis not present

## 2020-07-25 LAB — CULTURE, BLOOD (ROUTINE X 2)
Culture: NO GROWTH
Culture: NO GROWTH
Special Requests: ADEQUATE
Special Requests: ADEQUATE

## 2020-07-26 ENCOUNTER — Other Ambulatory Visit: Payer: Self-pay

## 2020-07-26 ENCOUNTER — Inpatient Hospital Stay (HOSPITAL_COMMUNITY): Payer: Medicare HMO | Attending: Hematology

## 2020-07-26 DIAGNOSIS — I4891 Unspecified atrial fibrillation: Secondary | ICD-10-CM | POA: Diagnosis not present

## 2020-07-26 DIAGNOSIS — E785 Hyperlipidemia, unspecified: Secondary | ICD-10-CM | POA: Diagnosis not present

## 2020-07-26 DIAGNOSIS — E119 Type 2 diabetes mellitus without complications: Secondary | ICD-10-CM | POA: Diagnosis not present

## 2020-07-26 DIAGNOSIS — Z79899 Other long term (current) drug therapy: Secondary | ICD-10-CM | POA: Diagnosis not present

## 2020-07-26 DIAGNOSIS — E538 Deficiency of other specified B group vitamins: Secondary | ICD-10-CM | POA: Diagnosis not present

## 2020-07-26 DIAGNOSIS — Z7984 Long term (current) use of oral hypoglycemic drugs: Secondary | ICD-10-CM | POA: Diagnosis not present

## 2020-07-26 DIAGNOSIS — I252 Old myocardial infarction: Secondary | ICD-10-CM | POA: Insufficient documentation

## 2020-07-26 DIAGNOSIS — I50810 Right heart failure, unspecified: Secondary | ICD-10-CM | POA: Insufficient documentation

## 2020-07-26 DIAGNOSIS — D509 Iron deficiency anemia, unspecified: Secondary | ICD-10-CM | POA: Insufficient documentation

## 2020-07-26 DIAGNOSIS — C3411 Malignant neoplasm of upper lobe, right bronchus or lung: Secondary | ICD-10-CM | POA: Diagnosis not present

## 2020-07-26 DIAGNOSIS — Z86711 Personal history of pulmonary embolism: Secondary | ICD-10-CM | POA: Insufficient documentation

## 2020-07-26 DIAGNOSIS — Z7982 Long term (current) use of aspirin: Secondary | ICD-10-CM | POA: Insufficient documentation

## 2020-07-26 DIAGNOSIS — Z87891 Personal history of nicotine dependence: Secondary | ICD-10-CM | POA: Diagnosis not present

## 2020-07-26 DIAGNOSIS — I110 Hypertensive heart disease with heart failure: Secondary | ICD-10-CM | POA: Insufficient documentation

## 2020-07-26 DIAGNOSIS — C3491 Malignant neoplasm of unspecified part of right bronchus or lung: Secondary | ICD-10-CM

## 2020-07-26 LAB — CBC WITH DIFFERENTIAL/PLATELET
Abs Immature Granulocytes: 0.02 10*3/uL (ref 0.00–0.07)
Basophils Absolute: 0 10*3/uL (ref 0.0–0.1)
Basophils Relative: 1 %
Eosinophils Absolute: 0.1 10*3/uL (ref 0.0–0.5)
Eosinophils Relative: 1 %
HCT: 31 % — ABNORMAL LOW (ref 36.0–46.0)
Hemoglobin: 9.5 g/dL — ABNORMAL LOW (ref 12.0–15.0)
Immature Granulocytes: 0 %
Lymphocytes Relative: 21 %
Lymphs Abs: 1.2 10*3/uL (ref 0.7–4.0)
MCH: 30.9 pg (ref 26.0–34.0)
MCHC: 30.6 g/dL (ref 30.0–36.0)
MCV: 101 fL — ABNORMAL HIGH (ref 80.0–100.0)
Monocytes Absolute: 0.3 10*3/uL (ref 0.1–1.0)
Monocytes Relative: 6 %
Neutro Abs: 4.1 10*3/uL (ref 1.7–7.7)
Neutrophils Relative %: 71 %
Platelets: 322 10*3/uL (ref 150–400)
RBC: 3.07 MIL/uL — ABNORMAL LOW (ref 3.87–5.11)
RDW: 14.3 % (ref 11.5–15.5)
WBC: 5.8 10*3/uL (ref 4.0–10.5)
nRBC: 0 % (ref 0.0–0.2)

## 2020-07-26 LAB — FERRITIN: Ferritin: 86 ng/mL (ref 11–307)

## 2020-07-26 LAB — IRON AND TIBC
Iron: 32 ug/dL (ref 28–170)
Saturation Ratios: 12 % (ref 10.4–31.8)
TIBC: 256 ug/dL (ref 250–450)
UIBC: 224 ug/dL

## 2020-07-27 IMAGING — DX DG CHEST 1V PORT
1 series · 1 of 1 positions shown · non-contrast
Comparison: Yesterday

CLINICAL DATA: Status update

EXAM:
PORTABLE CHEST 1 VIEW

[chest ap]
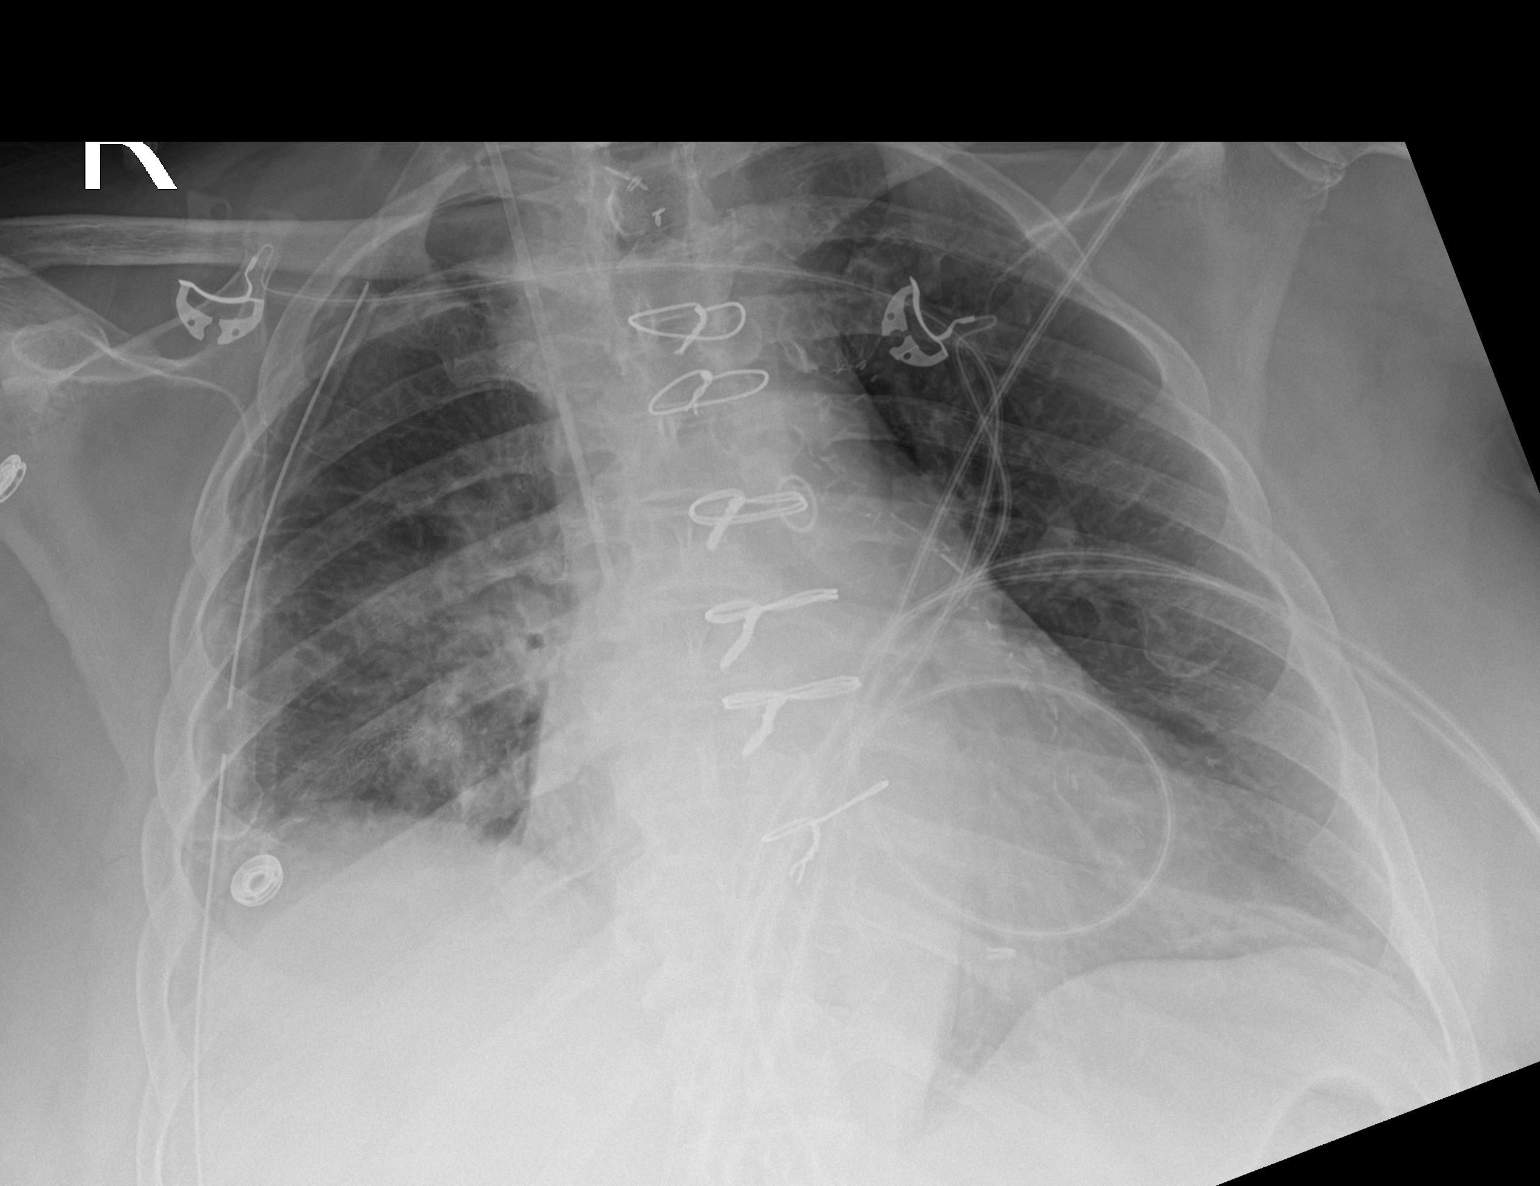

[1 of 1 positions shown; findings below may reference images not displayed]

FINDINGS: Right IJ line with tip at the upper cavoatrial junction. Right chest
tube with tip at the apex. Small right apical pneumothorax, 2 rib
interspaces in height. Asymmetric right perihilar opacity after
right-sided lobectomy. The left lung is clear. No visible effusion
or pneumothorax. CABG with stable heart size.
IMPRESSION: 1. Small right apical pneumothorax.
2. Right perihilar atelectasis.

## 2020-07-29 DIAGNOSIS — R531 Weakness: Secondary | ICD-10-CM | POA: Diagnosis not present

## 2020-08-01 ENCOUNTER — Encounter: Payer: Self-pay | Admitting: Cardiology

## 2020-08-01 ENCOUNTER — Encounter: Payer: Self-pay | Admitting: *Deleted

## 2020-08-01 ENCOUNTER — Ambulatory Visit: Payer: Medicare Other | Admitting: Cardiology

## 2020-08-01 VITALS — BP 156/70 | HR 64 | Ht 64.0 in | Wt 179.8 lb

## 2020-08-01 DIAGNOSIS — I10 Essential (primary) hypertension: Secondary | ICD-10-CM

## 2020-08-01 DIAGNOSIS — J449 Chronic obstructive pulmonary disease, unspecified: Secondary | ICD-10-CM

## 2020-08-01 DIAGNOSIS — I2510 Atherosclerotic heart disease of native coronary artery without angina pectoris: Secondary | ICD-10-CM

## 2020-08-01 DIAGNOSIS — E782 Mixed hyperlipidemia: Secondary | ICD-10-CM | POA: Diagnosis not present

## 2020-08-01 MED ORDER — FUROSEMIDE 20 MG PO TABS: ORAL_TABLET | ORAL | 1 refills | Status: DC

## 2020-08-01 NOTE — Progress Notes (Signed)
Clinical Summary Karen Tate is a 75 y.o.female seen today for follow up of the following medical problems.   1. CAD - prior CABG in 08/2017 (left internal mammary artery to left anterior descending, sequential saphenous vein graft to ramus intermedius branches 1 and 2). She presented with an NSTEMI - 10/2018 echo LVEF 60-65%  - no recent chest pains  2. Carotid stenosis - 6/20219 carotid US: RICA 9-62%, LICA 22-97% 98/9211 RICA 9-41%, LICA 74-08%  3. Hyperlipidemia - labs per pcp - she is on a statin  4. HTN - compliant with meds  5. Mitral regurgitation - 10/2018 echo moderate MR - 06/2020 echo mod MR, mod TR  6. Chronic diastolic HF - 03/4479 admission with SOB, thought to be volume overloaded and diuresed - 06/2020 echo LVEF 60-65%, grade II dd, mild RV dysfunction, PASP 61 mmHg - SOB improved, still some SOB - home weights up to 188 lbs prior to her admission. Normal weight 177-178 lbs at baseline.  - taking lasix 40mg  daily. Keeps weight down but having some cramps    7. Pulmonary HTN - 06/2020 echo LVEF 60-65%, grade II dd, mild RV dysfunction, PASP 61 mmHg   8. Lung cancer - history of stage Ia nonsmall cell lung CA - prior RUL lobectomy  9. History of PE  10. COPD - 04/2019 PFTs moderate obstruction, severe restriction, moderate diffusion defect - followed by pcp Past Medical History:  Diagnosis Date  . Anxiety neurosis   . Arthritis   . Asthma   . CAD (coronary artery disease)    a. 08/2017: abnormal nuc-> sent directly to [facility], NSTEMI, s/p CABG (left internal mammary artery to left anterior descending, sequential saphenous vein graft to ramus intermedius branches 1 and 2).  . Carotid artery disease ([place])   . Chronic anemia   . Dizziness   . GERD (gastroesophageal reflux disease)   . Glaucoma   . Heart disease   . Hiatal hernia   . Hyperlipemia   . Hypertension   . Ischemic cardiomyopathy   . Joint pain   . Mitral regurgitation   .  Postoperative atrial fibrillation ([place]) 08/2017  . Pulmonary embolus ([place])   . Type 2 diabetes mellitus (HCC)      Allergies  Allergen Reactions  . Beta Adrenergic Blockers Other (See Comments)    Dropped heart rate to the 40's  . Calcium Channel Blockers Other (See Comments)    Dropped HR into the 40's  . Naproxen Hives     Current Outpatient Medications  Medication Sig Dispense Refill  . acetaminophen (TYLENOL) 500 MG tablet Take 1,000 mg by mouth every 6 (six) hours as needed for moderate pain.     Marland Kitchen albuterol (ACCUNEB) 0.63 MG/3ML nebulizer solution Take 3 mLs by nebulization 4 (four) times daily.    Marland Kitchen albuterol (VENTOLIN HFA) 108 (90 Base) MCG/ACT inhaler Inhale 2 puffs into the lungs every 6 (six) hours as needed.    . ALPRAZolam (XANAX) 0.5 MG tablet Take 0.5 mg by mouth 2 (two) times daily.    Marland Kitchen amLODipine (NORVASC) 10 MG tablet Take 10 mg by mouth daily.    Marland Kitchen aspirin 81 MG tablet Take 1 tablet (81 mg total) by mouth daily.    Marland Kitchen atorvastatin (LIPITOR) 80 MG tablet Take 1 tablet (80 mg total) by mouth daily at 6 PM. 90 tablet 3  . carvedilol (COREG) 25 MG tablet Take 1 tablet by mouth 2 (two) times daily.    Marland Kitchen  cholecalciferol (VITAMIN D3) 25 MCG (1000 UNIT) tablet Take 1,000 Units by mouth daily.    Marland Kitchen doxycycline (VIBRA-TABS) 100 MG tablet Take 100 mg by mouth 2 (two) times daily.    . ferrous sulfate 325 (65 FE) MG tablet Take 325 mg by mouth 2 (two) times daily.    . fish oil-omega-3 fatty acids 1000 MG capsule Take 1 g by mouth 3 (three) times daily.     . furosemide (LASIX) 20 MG tablet Take 2 tablets (40 mg total) by mouth daily. 60 tablet 1  . latanoprost (XALATAN) 0.005 % ophthalmic solution Place 1 drop into both eyes at bedtime.    Marland Kitchen lisinopril (ZESTRIL) 40 MG tablet Take 1 tablet (40 mg total) by mouth daily. 90 tablet 1  . magnesium oxide (MAG-OX) 400 MG tablet Take 1 tablet (400 mg total) by mouth 2 (two) times daily. (Patient taking differently: Take 400 mg by  mouth daily.) 180 tablet 3  . meclizine (ANTIVERT) 25 MG tablet Take 25 mg by mouth 3 (three) times daily as needed for dizziness.     . metFORMIN (GLUCOPHAGE) 1000 MG tablet Take 1,000 mg by mouth daily.     . Multiple Vitamins-Minerals (CENTRUM SILVER 50+WOMEN) TABS Take 1 tablet by mouth daily.     Marland Kitchen omeprazole (PRILOSEC) 40 MG capsule Take 40 mg by mouth daily.    . potassium chloride SA (KLOR-CON) 20 MEQ tablet Take 1 tablet (20 mEq total) by mouth daily. TAKE 2 TABLETS DAILY FOR 3 DAYS 30 tablet 1  . vitamin B-12 (CYANOCOBALAMIN) 500 MCG tablet Take 500 mcg by mouth daily.     No current facility-administered medications for this visit.     Past Surgical History:  Procedure Laterality Date  . BACK SURGERY  2016  . CORONARY ARTERY BYPASS GRAFT N/A 09/18/2017   Procedure: CORONARY ARTERY BYPASS GRAFTING (CABG);  Surgeon: [name], MD;  Location: [place];  Service: Open Heart Surgery;  Laterality: N/A;  Saphenous vein harvest. LIMA to LAD Saphenous vein (sequential) ramus 1 & 2  . EYE SURGERY Bilateral    "surgery for glaucoma"  . HERNIA REPAIR    . INTERCOSTAL NERVE BLOCK Right 05/13/2019   Procedure: Intercostal Nerve Block;  Surgeon: [name], MD;  Location: [facility];  Service: Thoracic;  Laterality: Right;  . LEFT HEART CATH AND CORONARY ANGIOGRAPHY N/A 09/15/2017   Procedure: LEFT HEART CATH AND CORONARY ANGIOGRAPHY;  Surgeon: Lorretta Harp, MD;  Location: [facility];  Service: Cardiovascular;  Laterality: N/A;  . LYMPH NODE DISSECTION Right 05/13/2019   Procedure: Lymph Node Dissection;  Surgeon: [name], MD;  Location: [facility];  Service: Thoracic;  Laterality: Right;  . ROTATOR CUFF REPAIR Left    x2  . SHOULDER SURGERY     LEFT  . TEE WITHOUT CARDIOVERSION N/A 09/18/2017   Procedure: TRANSESOPHAGEAL ECHOCARDIOGRAM (TEE);  Surgeon: [name], MD;  Location: [facility];  Service: Open Heart Surgery;  Laterality: N/A;  . TOTAL  KNEE ARTHROPLASTY     LEFT  . VAGINAL HYSTERECTOMY     partial     Allergies  Allergen Reactions  . Beta Adrenergic Blockers Other (See Comments)    Dropped heart rate to the 40's  . Calcium Channel Blockers Other (See Comments)    Dropped HR into the 40's  . Naproxen Hives      Family History  Problem Relation Age of Onset  . Diabetes Father   .  Heart disease Father   . Breast cancer Mother   . Congestive Heart Failure Brother   . Anemia Daughter   . Seizures Daughter   . Migraines Daughter   . Diabetes Daughter   . Hypertension Daughter   . Bladder Cancer Son   . Congestive Heart Failure Son   . Congestive Heart Failure Son      Social History Ms. Riebe reports that she quit smoking about 42 years ago. Her smoking use included cigarettes. She has a 20.00 pack-year smoking history. She has never used smokeless tobacco. Ms. Voytko reports no history of alcohol use.   Review of Systems CONSTITUTIONAL: No weight loss, fever, chills, weakness or fatigue.  HEENT: Eyes: No visual loss, blurred vision, double vision or yellow sclerae.No hearing loss, sneezing, congestion, runny nose or sore throat.  SKIN: No rash or itching.  CARDIOVASCULAR: per hpi RESPIRATORY:per hpi GASTROINTESTINAL: No anorexia, nausea, vomiting or diarrhea. No abdominal pain or blood.  GENITOURINARY: No burning on urination, no polyuria NEUROLOGICAL: No headache, dizziness, syncope, paralysis, ataxia, numbness or tingling in the extremities. No change in bowel or bladder control.  MUSCULOSKELETAL: No muscle, back pain, joint pain or stiffness.  LYMPHATICS: No enlarged nodes. No history of splenectomy.  PSYCHIATRIC: No history of depression or anxiety.  ENDOCRINOLOGIC: No reports of sweating, cold or heat intolerance. No polyuria or polydipsia.  Marland Kitchen   Physical Examination Today's Vitals   08/01/20 0808  BP: (!) 156/70  Pulse: 64  SpO2: 99%  Weight: 179 lb 12.8 oz (81.6 kg)  Height: 5\' 4"   (1.626 m)   Body mass index is 30.86 kg/m.  Gen: resting comfortably, no acute distress HEENT: no scleral icterus, pupils equal round and reactive, no palptable cervical adenopathy,  CV: RRR, no m/r/g no jvd Resp: Clear to auscultation bilaterally GI: abdomen is soft, non-tender, non-distended, normal bowel sounds, no hepatosplenomegaly MSK: extremities are warm, no edema.  Skin: warm, no rash Neuro:  no focal deficits Psych: appropriate affect   Diagnostic Studies Echocardiogram 11/18/2018: 1. The left ventricle has normal systolic function with an ejection  fraction of 60-65%. The cavity size was normal. Left ventricular diastolic  parameters were normal.  2. The right ventricle has mildly reduced systolic function. The cavity  was normal. There is no increase in right ventricular wall thickness.  Right ventricular systolic pressure is moderately elevated with an  estimated pressure of 49.5 mmHg.  3. The aortic valve is tricuspid. Mild calcification of the aortic valve.  Mild to moderate aortic annular calcification noted.  4. The mitral valve is degenerative. Mild thickening of the mitral valve  leaflet. Mild calcification of the mitral valve leaflet. Mitral valve  regurgitation is moderate by color flow Doppler. The MR jet is eccentric  posteriorly directed.  5. The tricuspid valve is grossly normal.  6. The aorta is normal unless otherwise noted.   01/2020 carotid US Summary:  Right Carotid: Velocities in the right ICA are consistent with a 1-39%  stenosis.         The ECA appears >50% stenosed.   Left Carotid: Velocities in the left ICA are consistent with a 40-59%  stenosis.   Vertebrals: Bilateral vertebral arteries demonstrate antegrade flow.  Subclavians: Normal flow hemodynamics were seen in bilateral subclavian        arteries.   06/2020 echo  IMPRESSIONS    1. Left ventricular ejection fraction, by estimation, is 60 to 65%. The   left ventricle has normal function. The left ventricle  demonstrates  regional wall motion abnormalities (see scoring diagram/findings for  description). Left ventricular diastolic  parameters are consistent with Grade II diastolic dysfunction  (pseudonormalization).  2. Right ventricular systolic function is mildly reduced. The right  ventricular size is normal. There is severely elevated pulmonary artery  systolic pressure. The estimated right ventricular systolic pressure is  33.5 mmHg.  3. The mitral valve is abnormal. Moderate mitral valve regurgitation.  4. Tricuspid valve regurgitation is moderate.  5. The aortic valve is tricuspid. There is mild calcification of the  aortic valve. Aortic valve regurgitation is not visualized.  6. The inferior vena cava is normal in size with <50% respiratory  variability, suggesting right atrial pressure of 8 mmHg.    Assessment and Plan  1. CAD No recent symptoms, continue current meds  2. HTN - manual recheck 125/60 at goal, continue current meds  3. Hyperlipidemia - request pcp labs, continue statin  5. Carotid stenosis - mild to moderate, repeat later this year  6. Pulmonary HTN - noted by recent echo. Most likely secondary to her chronic O2 dependent lung disease, left sided heart disease. I think the possibility of vasodilators having a role is quite small, would not pursue further testing at this time  7. COPD - some ongoing SOB. Limited inhaler regimen, will refer to pulmonary.      Arnoldo Lenis, M.D

## 2020-08-01 NOTE — Patient Instructions (Signed)
Your physician recommends that you schedule a follow-up appointment in: [facility] has recommended you make the following change in your medication:   CHANGE LASIX 20 MG EVERY OTHER DAY ALTERNATING WITH 40 MG EVERY OTHER DAY  Your physician recommends that you return for lab work BMP/MG  You have been referred to PULMONARY   Thank you for choosing [facility]!!

## 2020-08-02 ENCOUNTER — Ambulatory Visit (HOSPITAL_COMMUNITY): Payer: Medicare HMO | Admitting: Hematology

## 2020-08-03 ENCOUNTER — Other Ambulatory Visit (HOSPITAL_COMMUNITY): Payer: Self-pay

## 2020-08-03 DIAGNOSIS — I509 Heart failure, unspecified: Secondary | ICD-10-CM | POA: Insufficient documentation

## 2020-08-03 DIAGNOSIS — J449 Chronic obstructive pulmonary disease, unspecified: Secondary | ICD-10-CM

## 2020-08-13 DIAGNOSIS — U071 COVID-19: Secondary | ICD-10-CM | POA: Diagnosis not present

## 2020-08-18 ENCOUNTER — Inpatient Hospital Stay (HOSPITAL_COMMUNITY): Payer: Medicare HMO

## 2020-08-18 ENCOUNTER — Other Ambulatory Visit: Payer: Self-pay

## 2020-08-18 ENCOUNTER — Encounter (HOSPITAL_COMMUNITY): Payer: Self-pay | Admitting: Hematology and Oncology

## 2020-08-18 ENCOUNTER — Inpatient Hospital Stay (HOSPITAL_COMMUNITY): Payer: Medicare HMO | Admitting: Physician Assistant

## 2020-08-18 VITALS — BP 130/58 | HR 64 | Temp 96.7°F | Resp 18 | Wt 179.7 lb

## 2020-08-18 DIAGNOSIS — E538 Deficiency of other specified B group vitamins: Secondary | ICD-10-CM | POA: Diagnosis not present

## 2020-08-18 DIAGNOSIS — D649 Anemia, unspecified: Secondary | ICD-10-CM

## 2020-08-18 DIAGNOSIS — D509 Iron deficiency anemia, unspecified: Secondary | ICD-10-CM | POA: Diagnosis not present

## 2020-08-18 DIAGNOSIS — E119 Type 2 diabetes mellitus without complications: Secondary | ICD-10-CM | POA: Diagnosis not present

## 2020-08-18 DIAGNOSIS — C3411 Malignant neoplasm of upper lobe, right bronchus or lung: Secondary | ICD-10-CM | POA: Diagnosis not present

## 2020-08-18 DIAGNOSIS — I252 Old myocardial infarction: Secondary | ICD-10-CM | POA: Diagnosis not present

## 2020-08-18 DIAGNOSIS — E785 Hyperlipidemia, unspecified: Secondary | ICD-10-CM | POA: Diagnosis not present

## 2020-08-18 DIAGNOSIS — I50810 Right heart failure, unspecified: Secondary | ICD-10-CM | POA: Diagnosis not present

## 2020-08-18 DIAGNOSIS — I110 Hypertensive heart disease with heart failure: Secondary | ICD-10-CM | POA: Diagnosis not present

## 2020-08-18 DIAGNOSIS — I4891 Unspecified atrial fibrillation: Secondary | ICD-10-CM | POA: Diagnosis not present

## 2020-08-18 LAB — RETICULOCYTES
Immature Retic Fract: 29.7 % — ABNORMAL HIGH (ref 2.3–15.9)
RBC.: 3.65 MIL/uL — ABNORMAL LOW (ref 3.87–5.11)
Retic Count, Absolute: 88.7 10*3/uL (ref 19.0–186.0)
Retic Ct Pct: 2.4 % (ref 0.4–3.1)

## 2020-08-18 LAB — CBC WITH DIFFERENTIAL/PLATELET
Abs Immature Granulocytes: 0.02 10*3/uL (ref 0.00–0.07)
Basophils Absolute: 0 10*3/uL (ref 0.0–0.1)
Basophils Relative: 1 %
Eosinophils Absolute: 0.1 10*3/uL (ref 0.0–0.5)
Eosinophils Relative: 1 %
HCT: 35.5 % — ABNORMAL LOW (ref 36.0–46.0)
Hemoglobin: 11.1 g/dL — ABNORMAL LOW (ref 12.0–15.0)
Immature Granulocytes: 1 %
Lymphocytes Relative: 37 %
Lymphs Abs: 1.6 10*3/uL (ref 0.7–4.0)
MCH: 31 pg (ref 26.0–34.0)
MCHC: 31.3 g/dL (ref 30.0–36.0)
MCV: 99.2 fL (ref 80.0–100.0)
Monocytes Absolute: 0.4 10*3/uL (ref 0.1–1.0)
Monocytes Relative: 10 %
Neutro Abs: 2.2 10*3/uL (ref 1.7–7.7)
Neutrophils Relative %: 50 %
Platelets: 215 10*3/uL (ref 150–400)
RBC: 3.58 MIL/uL — ABNORMAL LOW (ref 3.87–5.11)
RDW: 15.1 % (ref 11.5–15.5)
WBC: 4.3 10*3/uL (ref 4.0–10.5)
nRBC: 0 % (ref 0.0–0.2)

## 2020-08-18 LAB — COMPREHENSIVE METABOLIC PANEL
ALT: 20 U/L (ref 0–44)
AST: 19 U/L (ref 15–41)
Albumin: 4.1 g/dL (ref 3.5–5.0)
Alkaline Phosphatase: 54 U/L (ref 38–126)
Anion gap: 7 (ref 5–15)
BUN: 21 mg/dL (ref 8–23)
CO2: 26 mmol/L (ref 22–32)
Calcium: 9.2 mg/dL (ref 8.9–10.3)
Chloride: 103 mmol/L (ref 98–111)
Creatinine, Ser: 1.17 mg/dL — ABNORMAL HIGH (ref 0.44–1.00)
GFR, Estimated: 48 mL/min — ABNORMAL LOW (ref >60)
Glucose, Bld: 122 mg/dL — ABNORMAL HIGH (ref 70–99)
Potassium: 4.4 mmol/L (ref 3.5–5.1)
Sodium: 136 mmol/L (ref 135–145)
Total Bilirubin: 0.6 mg/dL (ref 0.3–1.2)
Total Protein: 7.9 g/dL (ref 6.5–8.1)

## 2020-08-18 LAB — LACTATE DEHYDROGENASE: LDH: 151 U/L (ref 98–192)

## 2020-08-18 LAB — FOLATE: Folate: 5.7 ng/mL — ABNORMAL LOW (ref >5.9)

## 2020-08-18 LAB — VITAMIN B12: Vitamin B-12: 383 pg/mL (ref 180–914)

## 2020-08-18 NOTE — Patient Instructions (Signed)
[place] at [facility] Discharge Instructions  You were seen today by Tarri Abernethy PA-C for your anemia. Your blood counts have dropped slightly, so I want to check a few labs to make sure nothing else is causing your blood counts to drop.  We will discuss your results via a telephone visit in 3-4 weeks.    LABS: Labs today before you leave   OTHER TESTS: Complete the stool cards and return them to the hospital lab as soon as possible  MEDICATIONS: Continue taking your iron (ferrous sulfate), B12 (cyanocobalamin), and vitamin D supplements.  FOLLOW-UP APPOINTMENT: Phone visit in 3-4 weeks.  We will schedule your next in-office visit after we talk on the phone.   Thank you for choosing [place] at [facility] to provide your oncology and hematology care.  To afford each patient quality time with our provider, please arrive at least 15 minutes before your scheduled appointment time.   If you have a lab appointment with the [facility] please come in thru the Main Entrance and check in at the main information desk.  You need to re-schedule your appointment should you arrive 10 or more minutes late.  We strive to give you quality time with our providers, and arriving late affects you and other patients whose appointments are after yours.  Also, if you no show three or more times for appointments you may be dismissed from the clinic at the providers discretion.     Again, thank you for choosing [facility].  Our hope is that these requests will decrease the amount of time that you wait before being seen by our physicians.       _____________________________________________________________  Should you have questions after your visit to [facility], please contact our office at [phone] and follow the prompts.  Our office hours are 8:00 a.m. and 4:30 p.m. Monday - Friday.  Please note that voicemails left after  4:00 p.m. may not be returned until the following business day.  We are closed weekends and major holidays.  You do have access to a nurse 24-7, just call the main number to the clinic [phone] and do not press any options, hold on the line and a nurse will answer the phone.    For prescription refill requests, have your pharmacy contact our office and allow 72 hours.    Due to Covid, you will need to wear a mask upon entering the hospital. If you do not have a mask, a mask will be given to you at the Main Entrance upon arrival. For doctor visits, patients may have 1 support person age 75 or older with them. For treatment visits, patients can not have anyone with them due to social distancing guidelines and our immunocompromised population.

## 2020-08-18 NOTE — Progress Notes (Signed)
[employer], [place] [address]   CLINIC:  Medical Oncology/Hematology  PCP:  Neale Burly, MD [address] [place] [id number] [phone]    REASON FOR VISIT:  Follow-up for stage I right lung adenocarcinoma and IDA  PRIOR THERAPY: Right upper lobectomy on 05/13/2019  NGS Results: Not done  CURRENT THERAPY: Intermittent Feraheme last on November 2021  BRIEF ONCOLOGIC HISTORY:  Oncology History  Non-small cell cancer of right lung ([place])  06/29/2019 Initial Diagnosis   Non-small cell cancer of right lung ([place])   06/29/2019 Cancer Staging   Staging form: Lung, AJCC 8th Edition - Clinical stage from 06/29/2019: Stage IA2 (cT1b, cN0, cM0) - Signed by Derek Jack, MD on 06/29/2019     CANCER STAGING: Cancer Staging Non-small cell cancer of right lung [facility]) Staging form: Lung, AJCC 8th Edition - Clinical stage from 06/29/2019: Stage IA2 (cT1b, cN0, cM0) - Signed by Derek Jack, MD on 06/29/2019   INTERVAL HISTORY:  Ms. SHITAL CRAYTON, a 75 y.o. female, returns for routine follow-up of her iron deficiency anemia. Varsha was last seen on 05/08/2020 by Dr. Delton Coombes.  She is also seen here for her stage I right lung adenocarcinoma, but returns today primarily to follow-up on her iron deficiency anemia.  She received IV Feraheme on 02/11/2020 and 02/21/2020, reports that she tolerated it well and felt better afterwards.  She did not require any IV iron at the time of her last visit in February 2022.  She is noted to have a drop in Hgb at this visit, but denies any signs or symptoms of blood loss - no epistaxis, hematochezia, hemoptysis, hematemesis, or melena.  She feels more fatigued lately, rates her energy at 50%.  She does have some dyspnea on exertion.  She denies chest pain, palpitations, and syncope.  She denies hemoptysis, B symptoms, or unintentional weight loss  She has poor appetite ever since her surgery, but has  been maintaining a stable weight.  REVIEW OF SYSTEMS:  Review of Systems  Constitutional: Positive for appetite change (75%) and fatigue (50%). Negative for chills, diaphoresis, fever and unexpected weight change.  HENT:   Negative for lump/mass and nosebleeds.   Eyes: Negative for eye problems.  Respiratory: Positive for shortness of breath (with exertion). Negative for cough and hemoptysis.   Cardiovascular: Negative for chest pain, leg swelling and palpitations.  Gastrointestinal: Negative for abdominal pain, blood in stool, constipation, diarrhea, nausea and vomiting.  Genitourinary: Negative for hematuria.   Musculoskeletal: Positive for arthralgias (left knee).  Skin: Negative.   Neurological: Negative for dizziness, headaches and light-headedness.  Hematological: Does not bruise/bleed easily.  Psychiatric/Behavioral: Positive for sleep disturbance.    PAST MEDICAL/SURGICAL HISTORY:  Past Medical History:  Diagnosis Date  . Anxiety neurosis   . Arthritis   . Asthma   . CAD (coronary artery disease)    a. 08/2017: abnormal nuc-> sent directly to [facility], NSTEMI, s/p CABG (left internal mammary artery to left anterior descending, sequential saphenous vein graft to ramus intermedius branches 1 and 2).  . Carotid artery disease ([place])   . Chronic anemia   . Dizziness   . GERD (gastroesophageal reflux disease)   . Glaucoma   . Heart disease   . Hiatal hernia   . Hyperlipemia   . Hypertension   . Ischemic cardiomyopathy   . Joint pain   . Mitral regurgitation   . Postoperative atrial fibrillation ([place]) 08/2017  .  Pulmonary embolus ([place])   . Type 2 diabetes mellitus ([place])    Past Surgical History:  Procedure Laterality Date  . BACK SURGERY  2016  . CORONARY ARTERY BYPASS GRAFT N/A 09/18/2017   Procedure: CORONARY ARTERY BYPASS GRAFTING (CABG);  Surgeon: [name], MD;  Location: [facility];  Service: Open Heart Surgery;  Laterality: N/A;  Saphenous vein harvest. LIMA to  LAD Saphenous vein (sequential) ramus 1 & 2  . EYE SURGERY Bilateral    "surgery for glaucoma"  . HERNIA REPAIR    . INTERCOSTAL NERVE BLOCK Right 05/13/2019   Procedure: Intercostal Nerve Block;  Surgeon: [name], MD;  Location: [facility];  Service: Thoracic;  Laterality: Right;  . LEFT HEART CATH AND CORONARY ANGIOGRAPHY N/A 09/15/2017   Procedure: LEFT HEART CATH AND CORONARY ANGIOGRAPHY;  Surgeon: Lorretta Harp, MD;  Location: [facility];  Service: Cardiovascular;  Laterality: N/A;  . LYMPH NODE DISSECTION Right 05/13/2019   Procedure: Lymph Node Dissection;  Surgeon: [name], MD;  Location: [facility];  Service: Thoracic;  Laterality: Right;  . ROTATOR CUFF REPAIR Left    x2  . SHOULDER SURGERY     LEFT  . TEE WITHOUT CARDIOVERSION N/A 09/18/2017   Procedure: TRANSESOPHAGEAL ECHOCARDIOGRAM (TEE);  Surgeon: [name], MD;  Location: [facility];  Service: Open Heart Surgery;  Laterality: N/A;  . TOTAL KNEE ARTHROPLASTY     LEFT  . VAGINAL HYSTERECTOMY     partial    SOCIAL HISTORY:  Social History   Socioeconomic History  . Marital status: Married    Spouse name: [name]  . Number of children: 5  . Years of education: Not on file  . Highest education level: Not on file  Occupational History  . Not on file  Tobacco Use  . Smoking status: Former Smoker    Packs/day: 1.25    Years: 16.00    Pack years: 20.00    Types: Cigarettes    Quit date: 03/25/1978    Years since quitting: 42.4  . Smokeless tobacco: Never Used  . Tobacco comment: quit more than 30 years ago  Vaping Use  . Vaping Use: Never used  Substance and Sexual Activity  . Alcohol use: No  . Drug use: No  . Sexual activity: Not on file  Other Topics Concern  . Not on file  Social History Narrative  . Not on file   Social Determinants of Health   Financial Resource Strain: Not on file  Food Insecurity: Not on file  Transportation Needs: Not on file  Physical Activity:  Not on file  Stress: Not on file  Social Connections: Not on file  Intimate Partner Violence: Not on file    FAMILY HISTORY:  Family History  Problem Relation Age of Onset  . Diabetes Father   . Heart disease Father   . Breast cancer Mother   . Congestive Heart Failure Brother   . Anemia Daughter   . Seizures Daughter   . Migraines Daughter   . Diabetes Daughter   . Hypertension Daughter   . Bladder Cancer Son   . Congestive Heart Failure Son   . Congestive Heart Failure Son     CURRENT MEDICATIONS:  Current Outpatient Medications  Medication Sig Dispense Refill  . acetaminophen (TYLENOL) 500 MG tablet Take 1,000 mg by mouth every 6 (six) hours as needed for moderate pain.     Marland Kitchen albuterol (ACCUNEB) 0.63 MG/3ML nebulizer solution Take 3  mLs by nebulization 4 (four) times daily.    Marland Kitchen albuterol (VENTOLIN HFA) 108 (90 Base) MCG/ACT inhaler Inhale 2 puffs into the lungs every 6 (six) hours as needed.    . ALPRAZolam (XANAX) 0.5 MG tablet Take 0.5 mg by mouth 2 (two) times daily.    Marland Kitchen amLODipine (NORVASC) 10 MG tablet Take 10 mg by mouth daily.    Marland Kitchen aspirin 81 MG tablet Take 1 tablet (81 mg total) by mouth daily.    Marland Kitchen atorvastatin (LIPITOR) 80 MG tablet Take 1 tablet (80 mg total) by mouth daily at 6 PM. 90 tablet 3  . carvedilol (COREG) 25 MG tablet Take 1 tablet by mouth 2 (two) times daily.    . cholecalciferol (VITAMIN D3) 25 MCG (1000 UNIT) tablet Take 1,000 Units by mouth daily.    . ferrous sulfate 325 (65 FE) MG tablet Take 325 mg by mouth 2 (two) times daily.    . fish oil-omega-3 fatty acids 1000 MG capsule Take 1 g by mouth 3 (three) times daily.     . furosemide (LASIX) 20 MG tablet TAKE 20 MG EVERY OTHER DAY ALTERNATING WITH 40 MG EVERY OTHER DAY 135 tablet 1  . latanoprost (XALATAN) 0.005 % ophthalmic solution Place 1 drop into both eyes at bedtime.    Marland Kitchen lisinopril (ZESTRIL) 40 MG tablet Take 40 mg by mouth daily.    . magnesium oxide (MAG-OX) 400 MG tablet Take 1  tablet (400 mg total) by mouth 2 (two) times daily. (Patient taking differently: Take 400 mg by mouth daily.) 180 tablet 3  . meclizine (ANTIVERT) 25 MG tablet Take 25 mg by mouth 3 (three) times daily as needed for dizziness.     . metFORMIN (GLUCOPHAGE) 1000 MG tablet Take 1,000 mg by mouth daily.     . Multiple Vitamins-Minerals (CENTRUM SILVER 50+WOMEN) TABS Take 1 tablet by mouth daily.     Marland Kitchen omeprazole (PRILOSEC) 40 MG capsule Take 40 mg by mouth daily.    . potassium chloride SA (KLOR-CON) 20 MEQ tablet Take 1 tablet (20 mEq total) by mouth daily. TAKE 2 TABLETS DAILY FOR 3 DAYS 30 tablet 1  . vitamin B-12 (CYANOCOBALAMIN) 500 MCG tablet Take 500 mcg by mouth daily.     No current facility-administered medications for this visit.    ALLERGIES:  Allergies  Allergen Reactions  . Beta Adrenergic Blockers Other (See Comments)    Dropped heart rate to the 40's  . Calcium Channel Blockers Other (See Comments)    Dropped HR into the 40's  . Naproxen Hives    PHYSICAL EXAM:  Performance status (ECOG): 1 - Symptomatic but completely ambulatory  Vitals:   08/18/20 1149 08/18/20 1209  BP: (!) 155/69 (!) 130/58  Pulse: 64   Resp: 18   Temp: (!) 96.7 F (35.9 C)   SpO2: 98%    Wt Readings from Last 3 Encounters:  08/18/20 179 lb 10.8 oz (81.5 kg)  08/01/20 179 lb 12.8 oz (81.6 kg)  07/21/20 186 lb 8.2 oz (84.6 kg)   Physical Exam Constitutional:      Appearance: Normal appearance. She is obese.  HENT:     Head: Normocephalic and atraumatic.     Mouth/Throat:     Mouth: Mucous membranes are moist.  Eyes:     Extraocular Movements: Extraocular movements intact.     Pupils: Pupils are equal, round, and reactive to light.  Cardiovascular:     Rate and Rhythm: Normal rate and regular rhythm.  Pulses: Normal pulses.     Heart sounds: Normal heart sounds.  Pulmonary:     Effort: Pulmonary effort is normal.     Breath sounds: Normal breath sounds.  Abdominal:     General:  Bowel sounds are normal.     Palpations: Abdomen is soft.     Tenderness: There is no abdominal tenderness.  Musculoskeletal:        General: No swelling.     Right lower leg: No edema.     Left lower leg: No edema.  Lymphadenopathy:     Cervical: No cervical adenopathy.  Skin:    General: Skin is warm and dry.  Neurological:     General: No focal deficit present.     Mental Status: She is alert and oriented to person, place, and time.  Psychiatric:        Mood and Affect: Mood normal.        Behavior: Behavior normal.      LABORATORY DATA:  I have reviewed the labs as listed.  CBC Latest Ref Rng & Units 07/26/2020 07/21/2020 07/20/2020  WBC 4.0 - 10.5 K/uL 5.8 6.4 6.6  Hemoglobin 12.0 - 15.0 g/dL 9.5(L) 9.5(L) 9.5(L)  Hematocrit 36.0 - 46.0 % 31.0(L) 30.1(L) 30.4(L)  Platelets 150 - 400 K/uL 322 274 297   CMP Latest Ref Rng & Units 07/21/2020 07/20/2020 05/04/2020  Glucose 70 - 99 mg/dL 161(H) 157(H) -  BUN 8 - 23 mg/dL 18 21 -  Creatinine 0.44 - 1.00 mg/dL 0.99 1.11(H) 0.90  Sodium 135 - 145 mmol/L 142 142 -  Potassium 3.5 - 5.1 mmol/L 3.2(L) 3.6 -  Chloride 98 - 111 mmol/L 103 105 -  CO2 22 - 32 mmol/L 30 29 -  Calcium 8.9 - 10.3 mg/dL 8.9 9.1 -  Total Protein 6.5 - 8.1 g/dL 6.7 7.1 -  Total Bilirubin 0.3 - 1.2 mg/dL 1.1 0.8 -  Alkaline Phos 38 - 126 U/L 69 74 -  AST 15 - 41 U/L 15 20 -  ALT 0 - 44 U/L 30 35 -    DIAGNOSTIC IMAGING:  I have independently reviewed the scans and discussed with the patient. CT ANGIO CHEST PE W OR WO CONTRAST  Result Date: 07/21/2020 CLINICAL DATA:  Chest pain and shortness of breath. Prescribed antibiotics on Tuesday. EXAM: CT ANGIOGRAPHY CHEST WITH CONTRAST TECHNIQUE: Multidetector CT imaging of the chest was performed using the standard protocol during bolus administration of intravenous contrast. Multiplanar CT image reconstructions and MIPs were obtained to evaluate the vascular anatomy. CONTRAST:  152mL OMNIPAQUE IOHEXOL 350 MG/ML SOLN  COMPARISON:  05/04/2020 FINDINGS: Cardiovascular: Good opacification of the central and segmental pulmonary arteries. No focal filling defects. No evidence of significant pulmonary embolus. Diffuse cardiac enlargement. No pericardial effusions. Reflux of contrast material into the hepatic veins consistent with right heart failure. Normal caliber thoracic aorta. No dissection. Calcification of the aorta. Postoperative changes consistent with coronary bypass. Mediastinum/Nodes: Mediastinal lymph nodes are mildly enlarged, similar to prior study, nonspecific. Esophagus is decompressed. Lungs/Pleura: Small bilateral pleural effusions. Diffuse airspace disease throughout the lungs with mild interstitial pattern likely representing edema. Postoperative changes in the right lung with partial resection. No definite evidence of residual or recurrent mass. Upper Abdomen: No acute abnormalities demonstrated in the visualized upper abdomen. Musculoskeletal: Degenerative changes in the spine. No destructive bone lesions. Sternotomy wires. Review of the MIP images confirms the above findings. IMPRESSION: 1. No evidence of significant pulmonary embolus. 2. Diffuse cardiac enlargement with evidence  of right heart failure. 3. Small bilateral pleural effusions with diffuse airspace disease and interstitial pattern likely representing edema. 4. Postoperative changes in the right lung with partial resection. No definite evidence of residual or recurrent mass. 5. Nonspecific mild mediastinal lymphadenopathy. 6. Aortic atherosclerosis. Aortic Atherosclerosis (ICD10-I70.0). Electronically Signed   By: Lucienne Capers M.D.   On: 07/21/2020 00:54   DG Chest Port 1 View  Result Date: 07/20/2020 CLINICAL DATA:  Shortness of breath. EXAM: PORTABLE CHEST 1 VIEW COMPARISON:  05/09/2020, CT 05/04/2020 FINDINGS: Post median sternotomy. Mild cardiomegaly. Right pleural effusion and basilar opacity, new from prior exam. There is mild chronic  elevation of right hemidiaphragm, which may be postsurgical. Mild chronic bronchial thickening. No acute left lung airspace disease. No pneumothorax. IMPRESSION: 1. Right pleural effusion and basilar opacity, new from prior exam, may represent pneumonia with parapneumonic effusion or effusion with adjacent atelectasis. 2. Mild cardiomegaly. Electronically Signed   By: Keith Rake M.D.   On: 07/20/2020 21:29   ECHOCARDIOGRAM COMPLETE  Result Date: 07/21/2020    ECHOCARDIOGRAM REPORT   Patient Name:   KAREE FORGE Date of Exam: 07/21/2020 Medical Rec #:  [id number]    Height:       65.0 in Accession #:    [id number]   Weight:       186.5 lb Date of Birth:  1945/10/22    BSA:          1.920 m Patient Age:    16 years     BP:           136/61 mmHg Patient Gender: F            HR:           91 bpm. Exam Location:  [facility] Procedure: 2D Echo, Cardiac Doppler and Color Doppler Indications:    CHF  History:        Patient has prior history of Echocardiogram examinations, most                 recent 11/18/2018. Cardiomyopathy, CAD and Previous Myocardial                 Infarction, Prior CABG, Signs/Symptoms:Dyspnea; Risk                 Factors:Hypertension and Dyslipidemia. Pleural effusions.  Sonographer:    Dustin Flock RDCS Referring Phys: [id number] ASIA B [place]  1. Left ventricular ejection fraction, by estimation, is 60 to 65%. The left ventricle has normal function. The left ventricle demonstrates regional wall motion abnormalities (see scoring diagram/findings for description). Left ventricular diastolic parameters are consistent with Grade II diastolic dysfunction (pseudonormalization).  2. Right ventricular systolic function is mildly reduced. The right ventricular size is normal. There is severely elevated pulmonary artery systolic pressure. The estimated right ventricular systolic pressure is 37.1 mmHg.  3. The mitral valve is abnormal. Moderate mitral valve regurgitation.  4.  Tricuspid valve regurgitation is moderate.  5. The aortic valve is tricuspid. There is mild calcification of the aortic valve. Aortic valve regurgitation is not visualized.  6. The inferior vena cava is normal in size with <50% respiratory variability, suggesting right atrial pressure of 8 mmHg. FINDINGS  Left Ventricle: Left ventricular ejection fraction, by estimation, is 60 to 65%. The left ventricle has normal function. The left ventricle demonstrates regional wall motion abnormalities. The left ventricular internal cavity size was normal in size. There is borderline left ventricular hypertrophy. Left ventricular diastolic parameters  are consistent with Grade II diastolic dysfunction (pseudonormalization).  LV Wall Scoring: The basal inferior segment, apical inferior segment, and basal inferoseptal segment are hypokinetic. The entire anterior wall, entire lateral wall, entire anterior septum, mid inferoseptal segment, mid inferior segment, and apex are normal. Right Ventricle: The right ventricular size is normal. No increase in right ventricular wall thickness. Right ventricular systolic function is mildly reduced. There is severely elevated pulmonary artery systolic pressure. The tricuspid regurgitant velocity is 3.65 m/s, and with an assumed right atrial pressure of 8 mmHg, the estimated right ventricular systolic pressure is 18.8 mmHg. Left Atrium: Left atrial size was normal in size. Right Atrium: Right atrial size was normal in size. Pericardium: There is no evidence of pericardial effusion. Mitral Valve: The mitral valve is abnormal. There is mild thickening of the mitral valve leaflet(s). There is mild calcification of the mitral valve leaflet(s). Moderate mitral valve regurgitation. Tricuspid Valve: The tricuspid valve is grossly normal. Tricuspid valve regurgitation is moderate. Aortic Valve: The aortic valve is tricuspid. There is mild calcification of the aortic valve. There is mild aortic valve  annular calcification. Aortic valve regurgitation is not visualized. Pulmonic Valve: The pulmonic valve was grossly normal. Pulmonic valve regurgitation is trivial. Aorta: The aortic root is normal in size and structure. Venous: The inferior vena cava is normal in size with less than 50% respiratory variability, suggesting right atrial pressure of 8 mmHg. IAS/Shunts: No atrial level shunt detected by color flow Doppler.  LEFT VENTRICLE PLAX 2D LVIDd:         4.13 cm  Diastology LVIDs:         2.93 cm  LV e' medial:    5.33 cm/s LV PW:         1.09 cm  LV E/e' medial:  25.3 LV IVS:        1.08 cm  LV e' lateral:   7.51 cm/s LVOT diam:     1.60 cm  LV E/e' lateral: 18.0 LV SV:         49 LV SV Index:   26 LVOT Area:     2.01 cm  RIGHT VENTRICLE RV Basal diam:  3.24 cm RV S prime:     7.07 cm/s TAPSE (M-mode): 2.0 cm LEFT ATRIUM             Index       RIGHT ATRIUM           Index LA diam:        3.40 cm 1.77 cm/m  RA Area:     12.30 cm LA Vol (A2C):   42.8 ml 22.29 ml/m RA Volume:   29.50 ml  15.36 ml/m LA Vol (A4C):   50.9 ml 26.51 ml/m LA Biplane Vol: 47.1 ml 24.53 ml/m  AORTIC VALVE LVOT Vmax:   102.00 cm/s LVOT Vmean:  76.200 cm/s LVOT VTI:    0.244 m  AORTA Ao Root diam: 2.50 cm MITRAL VALVE                TRICUSPID VALVE MV Area (PHT): 2.47 cm     TR Peak grad:   53.3 mmHg MV Decel Time: 307 msec     TR Vmax:        365.00 cm/s MV E velocity: 135.00 cm/s MV A velocity: 56.90 cm/s   SHUNTS MV E/A ratio:  2.37         Systemic VTI:  0.24 m  Systemic Diam: 1.60 cm Rozann Lesches MD Electronically signed by Rozann Lesches MD Signature Date/Time: 07/21/2020/4:02:52 PM    Final      ASSESSMENT: 1. 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 -PET CT scan on 04/26/2019 showed mildly  hypermetabolic 1.1 cm right upper lobe pulmonary nodule with SUV 2.4. No findings of adenopathy or metastatic disease. -Right upper lobectomy on 05/13/2019 shows 1.3 cm invasive adenocarcinoma, negative for visceral pleural invasion, LVI negative, margins negative. Lymph nodes from stations 7, 11 R, 10 R, 4R, 12Rare negative. -CT chest surveillance every 6 months for 2 to 3 years was recommended followed by noncontrast CT annually. -CT chest on 10/27/2019 showed surgical changes in the right upper lobe lobectomy with no findings of recurrence. 2 small indeterminate right lower lobe pulmonary nodules, follow-up recommended. -CT chest on 05/04/2020 with post right upper lobe resection with no evidence of recurrence or metastasis.  Clearing of bilateral pleural effusions.  2. Normocytic anemia: -Review of labs shows decreased hemoglobin 9.5 (07/26/2020), down from Hgb 12.0 at the time of her visit on 05/04/2020 - Iron panel (07/26/2020) shows ferritin 86 iron saturation 12% - She denies any signs or symptoms of bleeding, no gross rectal hemorrhage or melena -She has mild degree of chronic kidney disease which is likely contributing to her anemia; most recent GFR 59 (07/21/2020) -Colonoscopy earlier this year was reportedly normal. -Last Feraheme on 02/11/2020 and 02/21/2020  3. Weight loss: -She lost about 20-25 pounds since her surgery. -She is slowly gaining back. Her appetite is improving. - Weight today is relatively stable (179 pounds and 11 ounces) compared to last visit 3 months ago (181 pounds)   PLAN:  1. Stage I (PT 1 BPN 0) right upper lobe adenocarcinoma: -Reviewed CT chest with contrast from 05/04/2020 which did not show any evidence of recurrence. -We will consider repeating chest CT 6 months after last scan (due in August 2022)  2. Normocytic anemia: -She received Feraheme in November 2021, did not require any IV iron supplementation at her last visit in February 2022 -Review  of labs shows decreased hemoglobin 9.5 (07/26/2020), down from Hgb 12.0 at the time of her visit on 05/04/2020; MCV slightly macrocytic at 101.0 - Iron panel (07/26/2020) shows ferritin 86 iron saturation 12% - No indication for IV iron at this time, we will recheck iron panel in 2 to 3 months - We will check myeloma/MGUS panel and nutritional panel along with reticulocytes and LDH to evaluate for other possible causes of anemia - Check stool cards x3 to investigate potential for occult GI bleed - Consider erythropoietin if Hgb not improved at next visit; suspect anemia related to CKD but unable to rule out associated MDS - Continue ferrous sulfate 325 mg daily - Labs today, phone visit in 3 to 4-week  3.Vitamin B12 deficiency: -B12 level is normal (540) on 05/04/2020 - Continue B12 1 mg tablet daily. - Check B12 and methylmalonic acid with labs today   PLAN SUMMARY & DISPOSITION: - Labs today PLUS stool cards - Phone visit in 2 weeks to discuss results (will order follow-up labs, CT imaging, and will set up next visit at the time of the phone visit)  All questions were answered. The patient knows to call the clinic with any problems, questions or concerns.  Medical  decision making: Moderate  Time spent on visit: I spent 20 minutes counseling the patient face to face. The total time spent in the appointment was 30 minutes and more than 50% was on counseling.   Harriett Rush, PA-C 08/18/20 12:59 PM

## 2020-08-19 LAB — COPPER, SERUM: Copper: 119 ug/dL (ref 80–158)

## 2020-08-19 LAB — VITAMIN D 25 HYDROXY (VIT D DEFICIENCY, FRACTURES): Vit D, 25-Hydroxy: 51.58 ng/mL (ref 30–100)

## 2020-08-22 ENCOUNTER — Other Ambulatory Visit (HOSPITAL_COMMUNITY): Payer: Self-pay | Admitting: Surgery

## 2020-08-22 DIAGNOSIS — I4891 Unspecified atrial fibrillation: Secondary | ICD-10-CM | POA: Diagnosis not present

## 2020-08-22 DIAGNOSIS — E785 Hyperlipidemia, unspecified: Secondary | ICD-10-CM | POA: Diagnosis not present

## 2020-08-22 DIAGNOSIS — C3411 Malignant neoplasm of upper lobe, right bronchus or lung: Secondary | ICD-10-CM | POA: Diagnosis not present

## 2020-08-22 DIAGNOSIS — I110 Hypertensive heart disease with heart failure: Secondary | ICD-10-CM | POA: Diagnosis not present

## 2020-08-22 DIAGNOSIS — I50810 Right heart failure, unspecified: Secondary | ICD-10-CM | POA: Diagnosis not present

## 2020-08-22 DIAGNOSIS — D649 Anemia, unspecified: Secondary | ICD-10-CM

## 2020-08-22 DIAGNOSIS — D509 Iron deficiency anemia, unspecified: Secondary | ICD-10-CM | POA: Diagnosis not present

## 2020-08-22 DIAGNOSIS — I252 Old myocardial infarction: Secondary | ICD-10-CM | POA: Diagnosis not present

## 2020-08-22 DIAGNOSIS — E538 Deficiency of other specified B group vitamins: Secondary | ICD-10-CM | POA: Diagnosis not present

## 2020-08-22 DIAGNOSIS — E119 Type 2 diabetes mellitus without complications: Secondary | ICD-10-CM | POA: Diagnosis not present

## 2020-08-22 LAB — PROTEIN ELECTROPHORESIS, SERUM
A/G Ratio: 1.1 (ref 0.7–1.7)
Albumin ELP: 3.7 g/dL (ref 2.9–4.4)
Alpha-1-Globulin: 0.2 g/dL (ref 0.0–0.4)
Alpha-2-Globulin: 0.9 g/dL (ref 0.4–1.0)
Beta Globulin: 1.1 g/dL (ref 0.7–1.3)
Gamma Globulin: 1.1 g/dL (ref 0.4–1.8)
Globulin, Total: 3.4 g/dL (ref 2.2–3.9)
Total Protein ELP: 7.1 g/dL (ref 6.0–8.5)

## 2020-08-22 LAB — KAPPA/LAMBDA LIGHT CHAINS
Kappa free light chain: 29.8 mg/L — ABNORMAL HIGH (ref 3.3–19.4)
Kappa, lambda light chain ratio: 1.92 — ABNORMAL HIGH (ref 0.26–1.65)
Lambda free light chains: 15.5 mg/L (ref 5.7–26.3)

## 2020-08-22 LAB — OCCULT BLOOD X 1 CARD TO LAB, STOOL
Fecal Occult Bld: NEGATIVE
Fecal Occult Bld: NEGATIVE
Fecal Occult Bld: NEGATIVE

## 2020-08-23 ENCOUNTER — Other Ambulatory Visit: Payer: Self-pay | Admitting: Cardiology

## 2020-08-23 LAB — METHYLMALONIC ACID, SERUM: Methylmalonic Acid, Quantitative: 134 nmol/L (ref 0–378)

## 2020-08-23 LAB — IMMUNOFIXATION ELECTROPHORESIS
IgA: 330 mg/dL (ref 64–422)
IgG (Immunoglobin G), Serum: 1134 mg/dL (ref 586–1602)
IgM (Immunoglobulin M), Srm: 105 mg/dL (ref 26–217)
Total Protein ELP: 7.4 g/dL (ref 6.0–8.5)

## 2020-08-29 DIAGNOSIS — M79676 Pain in unspecified toe(s): Secondary | ICD-10-CM | POA: Diagnosis not present

## 2020-08-29 DIAGNOSIS — E1142 Type 2 diabetes mellitus with diabetic polyneuropathy: Secondary | ICD-10-CM | POA: Diagnosis not present

## 2020-08-29 DIAGNOSIS — R531 Weakness: Secondary | ICD-10-CM | POA: Diagnosis not present

## 2020-08-29 DIAGNOSIS — B351 Tinea unguium: Secondary | ICD-10-CM | POA: Diagnosis not present

## 2020-08-29 DIAGNOSIS — L84 Corns and callosities: Secondary | ICD-10-CM | POA: Diagnosis not present

## 2020-09-04 ENCOUNTER — Encounter (HOSPITAL_COMMUNITY): Payer: Self-pay

## 2020-09-04 ENCOUNTER — Other Ambulatory Visit: Payer: Self-pay

## 2020-09-04 ENCOUNTER — Encounter (HOSPITAL_COMMUNITY)
Admission: RE | Admit: 2020-09-04 | Discharge: 2020-09-04 | Disposition: A | Discharge status: Home / Self Care | Payer: Medicare HMO | Source: Ambulatory Visit | Attending: Cardiology | Admitting: Cardiology

## 2020-09-04 DIAGNOSIS — I509 Heart failure, unspecified: Secondary | ICD-10-CM | POA: Insufficient documentation

## 2020-09-04 DIAGNOSIS — R0600 Dyspnea, unspecified: Secondary | ICD-10-CM | POA: Insufficient documentation

## 2020-09-04 NOTE — Progress Notes (Signed)
Incomplete Session Note  Patient Details  Name: Karen Tate MRN: [id number] Date of Birth: 1945-09-25 Referring Provider:    [name] did not complete her rehab session.  Pt came in and completed orientation, but she wore sandals. She did not complete the walk test due to this. Patients are instructed when scheduled that their shoes must be closed toe and closed heel. She will return 09/12/2020 at 1045 to complete her walk test.

## 2020-09-05 ENCOUNTER — Encounter: Payer: Self-pay | Admitting: Pulmonary Disease

## 2020-09-05 ENCOUNTER — Other Ambulatory Visit: Payer: Self-pay

## 2020-09-05 ENCOUNTER — Telehealth: Payer: Self-pay | Admitting: Cardiology

## 2020-09-05 ENCOUNTER — Ambulatory Visit: Payer: Medicare HMO | Admitting: Pulmonary Disease

## 2020-09-05 VITALS — BP 142/68 | HR 63 | Temp 97.2°F | Ht 64.0 in | Wt 178.0 lb

## 2020-09-05 DIAGNOSIS — R0600 Dyspnea, unspecified: Secondary | ICD-10-CM | POA: Diagnosis not present

## 2020-09-05 DIAGNOSIS — G4734 Idiopathic sleep related nonobstructive alveolar hypoventilation: Secondary | ICD-10-CM

## 2020-09-05 DIAGNOSIS — R0609 Other forms of dyspnea: Secondary | ICD-10-CM

## 2020-09-05 NOTE — Telephone Encounter (Signed)
Patient informed and verbalized understanding of plan. 

## 2020-09-05 NOTE — Telephone Encounter (Signed)
Patient called stating that she went to [employer] to buy Veterinarian tropical Analgesic ointment for horses. She is wanting to know if she can use on her achy muscles for pain.

## 2020-09-05 NOTE — Addendum Note (Signed)
Addended by: Merrilee Seashore on: 09/05/2020 12:22 PM   Modules accepted: Orders

## 2020-09-05 NOTE — Telephone Encounter (Signed)
NO. Medication for veterinarian use are not property regulated for human use and we can not guarantee the amount absorbed through your skin is not too excessive for you.  Any topical analgesic over the counter may help you and is property designed for human use.

## 2020-09-05 NOTE — Patient Instructions (Signed)
Will arrange for chest xray, pulmonary function test, and overnight oxygen test  Follow up in 7 to 8 weeks

## 2020-09-05 NOTE — Progress Notes (Signed)
[facility]  Chief Complaint  Patient presents with   Consult    Shortness of breath with exertion    Constitutional:  BP (!) 142/68 (BP Location: Left Arm, Cuff Size: Normal)   Pulse 63   Temp (!) 97.2 F (36.2 C) (Temporal)   Ht 5\' 4"  (1.626 m)   Wt 178 lb (80.7 kg)   SpO2 100% Comment: Room air  BMI 30.55 kg/m   Past Medical History:  OA, CAD, Anemia, GERD, Glaucoma, Hiatal hernia, HLD, HTN, ischemic CM, CAD s/p CABG, Post op a fib, PE, DM type 2, NSCLC s/p RULectomy  Past Surgical History:  She  has a past surgical history that includes Hernia repair; Total knee arthroplasty; Shoulder surgery; LEFT HEART CATH AND CORONARY ANGIOGRAPHY (N/A, 09/15/2017); Coronary artery bypass graft (N/A, 09/18/2017); TEE without cardioversion (N/A, 09/18/2017); Back surgery (2016); Eye surgery (Bilateral); Vaginal hysterectomy; Rotator cuff repair (Left); Lymph node dissection (Right, 05/13/2019); and Intercostal nerve block (Right, 05/13/2019).  Brief Summary:  Karen Tate is a 75 y.o. female former smoker with with dyspnea.      Subjective:   She is here with her husband.  She has history of CAD, valvular heart disease, pulmonary hypertension, and diastolic CHF.  She has remote history of smoking.  She had right upper lobectomy for lung cancer.  She has intermittent cough, wheeze, and dyspnea.  She was in hospital in April for CHF exacerbation.  She feels her breathing is better now.  She feels her activity currently is limited more by her back and knee issues.  She has albuterol - she has used it but doesn't feel like it helps.  She gets short of breath when walking too much, but recovers after resting for a few minutes.  She has home oxygen set up and uses as needed with activity.  Not using at night.  Doesn't feel like she has issues with her breathing at night.  PFT from 2021 showed more of a restrictive and diffusion defect, both of which were  mild.  Physical Exam:   Appearance - well kempt, uses a cane  ENMT - no sinus tenderness, no oral exudate, no LAN, Mallampati 2 airway, no stridor  Respiratory - equal breath sounds bilaterally, no wheezing or rales  CV - s1s2 regular rate and rhythm, 2/6 systolic murmur  Ext - no clubbing, no edema  Skin - no rashes  Psych - normal mood and affect   Pulmonary testing:  PFT 05/12/19 >> FEV1 1.02 (59%), FEV1% 79, TLC 3.07 (60%), FEV1% 63%  Chest Imaging:  CT angio chest 07/21/20 >> CHF pattern  Sleep Tests:    Cardiac Tests:  Echo 07/21/20 >> EF 60 to 65%, grade 2 DD, RVSP 61.3 mmHg, mod MR, mod TR  Social History:  She  reports that she quit smoking about 42 years ago. Her smoking use included cigarettes. She has a 17.50 pack-year smoking history. She has never used smokeless tobacco. She reports that she does not drink alcohol and does not use drugs.  Family History:  Her family history includes Anemia in her daughter; Bladder Cancer in her son; Breast cancer in her mother; Congestive Heart Failure in her brother, son, and son; Diabetes in her daughter and father; Heart disease in her father; Hypertension in her daughter; Migraines in her daughter; Seizures in her daughter.     Assessment/Plan:   Dyspnea on exertion. - most likely related to cardiac disease and deconditioning -  she carries a diagnosis of COPD, but her previous PFT wasn't consistent with this; in addition she hasn't noticed any benefit from inhaler therapy - will arrange for chest xray and pulmonary function test to assess further - continue prn albuterol  Chronic respiratory failure with hypoxia. 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 - followed by Dr. Delton Coombes with [facility]  Coronary artery disease, Valvular heart disease, Chronic diastolic CHF. - followed by Dr. Carlyle Dolly with [facility]  Time Spent Involved in Patient Care on Day of Examination:  47 minutes  Follow up:   Patient Instructions  Will arrange for chest xray, pulmonary function test, and overnight oxygen test  Follow up in 7 to 8 weeks  Medication List:   Allergies as of 09/05/2020       Reactions   Beta Adrenergic Blockers Other (See Comments)   Dropped heart rate to the 40's   Calcium Channel Blockers Other (See Comments)   Dropped HR into the 40's   Naproxen Hives        Medication List        Accurate as of September 05, 2020 12:14 PM. If you have any questions, ask your nurse or doctor.          acetaminophen 500 MG tablet Commonly known as: TYLENOL Take 1,000 mg by mouth every 6 (six) hours as needed for moderate pain.   albuterol 108 (90 Base) MCG/ACT inhaler Commonly known as: VENTOLIN HFA Inhale 2 puffs into the lungs every 6 (six) hours as needed.   albuterol 0.63 MG/3ML nebulizer solution Commonly known as: ACCUNEB Take 3 mLs by nebulization 4 (four) times daily.   ALPRAZolam 0.5 MG tablet Commonly known as: XANAX Take 0.5 mg by mouth 2 (two) times daily.   amLODipine 10 MG tablet Commonly known as: NORVASC Take 10 mg by mouth daily.   aspirin EC 81 MG tablet Take 1 tablet (81 mg total) by mouth daily.   atorvastatin 80 MG tablet Commonly known as: LIPITOR Take 1 tablet (80 mg total) by mouth daily at 6 PM.   carvedilol 25 MG tablet Commonly known as: COREG Take 1 tablet by mouth 2 (two) times daily.   Centrum Silver 50+Women Tabs Take 1 tablet by  mouth daily.   cholecalciferol 25 MCG (1000 UNIT) tablet Commonly known as: VITAMIN D3 Take 1,000 Units by mouth daily.   ferrous sulfate 325 (65 FE) MG tablet Take 325 mg by mouth 2 (two) times daily.   fish oil-omega-3 fatty acids 1000 MG capsule Take 1 g by mouth 3 (three) times daily.   furosemide 20 MG tablet Commonly known as: LASIX TAKE 20 MG EVERY OTHER DAY ALTERNATING WITH 40 MG EVERY OTHER DAY   latanoprost 0.005 % ophthalmic solution Commonly known as: XALATAN Place 1 drop into both eyes at bedtime.   lisinopril 40 MG tablet Commonly known as: ZESTRIL TAKE 1 TABLET EVERY  DAY ( DOSE INCREASE )   magnesium oxide 400 MG tablet Commonly known as: MAG-OX Take 1 tablet (400 mg total) by mouth 2 (two) times daily. What changed: when to take this   meclizine 25 MG tablet Commonly known as: ANTIVERT Take 25 mg by mouth 3 (three) times daily as needed for dizziness.   metFORMIN 1000 MG tablet Commonly known as: GLUCOPHAGE Take 1,000 mg by mouth daily.   omeprazole 40 MG capsule Commonly known as: PRILOSEC Take 40 mg by mouth daily.   potassium chloride SA 20 MEQ tablet Commonly known as: KLOR-CON Take 1 tablet (20 mEq total) by mouth daily. TAKE 2 TABLETS DAILY FOR 3 DAYS   vitamin B-12 500 MCG tablet Commonly known as: CYANOCOBALAMIN Take 500 mcg by mouth daily.        Signature:  Chesley Mires, MD [place] Pager - [phone] 09/05/2020, 12:14 PM

## 2020-09-08 ENCOUNTER — Ambulatory Visit (HOSPITAL_COMMUNITY)
Admission: RE | Admit: 2020-09-08 | Discharge: 2020-09-08 | Disposition: A | Discharge status: Home / Self Care | Payer: Medicare HMO | Source: Ambulatory Visit | Attending: Pulmonary Disease | Admitting: Pulmonary Disease

## 2020-09-08 ENCOUNTER — Other Ambulatory Visit: Payer: Self-pay

## 2020-09-08 DIAGNOSIS — R0600 Dyspnea, unspecified: Secondary | ICD-10-CM | POA: Diagnosis not present

## 2020-09-08 DIAGNOSIS — R0602 Shortness of breath: Secondary | ICD-10-CM | POA: Diagnosis not present

## 2020-09-08 DIAGNOSIS — R0609 Other forms of dyspnea: Secondary | ICD-10-CM

## 2020-09-08 DIAGNOSIS — J90 Pleural effusion, not elsewhere classified: Secondary | ICD-10-CM | POA: Diagnosis not present

## 2020-09-12 ENCOUNTER — Encounter (HOSPITAL_COMMUNITY)
Admission: RE | Admit: 2020-09-12 | Discharge: 2020-09-12 | Disposition: A | Discharge status: Home / Self Care | Payer: Medicare HMO | Source: Ambulatory Visit | Attending: Cardiology | Admitting: Cardiology

## 2020-09-12 ENCOUNTER — Other Ambulatory Visit: Payer: Self-pay

## 2020-09-12 VITALS — Ht 64.0 in | Wt 176.8 lb

## 2020-09-12 DIAGNOSIS — I509 Heart failure, unspecified: Secondary | ICD-10-CM

## 2020-09-12 DIAGNOSIS — R0600 Dyspnea, unspecified: Secondary | ICD-10-CM | POA: Diagnosis not present

## 2020-09-12 DIAGNOSIS — J9691 Respiratory failure, unspecified with hypoxia: Secondary | ICD-10-CM | POA: Diagnosis not present

## 2020-09-12 LAB — GLUCOSE, CAPILLARY: Glucose-Capillary: 131 mg/dL — ABNORMAL HIGH (ref 70–99)

## 2020-09-12 NOTE — Progress Notes (Signed)
Pulmonary Individual Treatment Plan  Patient Details  Name: Karen Tate MRN: [id number] Date of Birth: 1945-09-28 Referring Provider:  Dr. Harl Bowie   Initial Encounter Date: 09/04/2020  Visit Diagnosis: Chronic congestive heart failure, unspecified heart failure type ([place])  Patient's Home Medications on Admission:   Current Outpatient Medications:    acetaminophen (TYLENOL) 500 MG tablet, Take 1,000 mg by mouth every 6 (six) hours as needed for moderate pain. , Disp: , Rfl:    albuterol (ACCUNEB) 0.63 MG/3ML nebulizer solution, Take 3 mLs by nebulization 4 (four) times daily., Disp: , Rfl:    albuterol (VENTOLIN HFA) 108 (90 Base) MCG/ACT inhaler, Inhale 2 puffs into the lungs every 6 (six) hours as needed., Disp: , Rfl:    ALPRAZolam (XANAX) 0.5 MG tablet, Take 0.5 mg by mouth 2 (two) times daily., Disp: , Rfl:    amLODipine (NORVASC) 10 MG tablet, Take 10 mg by mouth daily., Disp: , Rfl:    aspirin 81 MG tablet, Take 1 tablet (81 mg total) by mouth daily., Disp: , Rfl:    atorvastatin (LIPITOR) 80 MG tablet, Take 1 tablet (80 mg total) by mouth daily at 6 PM., Disp: 90 tablet, Rfl: 3   carvedilol (COREG) 25 MG tablet, Take 1 tablet by mouth 2 (two) times daily., Disp: , Rfl:    cholecalciferol (VITAMIN D3) 25 MCG (1000 UNIT) tablet, Take 1,000 Units by mouth daily., Disp: , Rfl:    ferrous sulfate 325 (65 FE) MG tablet, Take 325 mg by mouth 2 (two) times daily., Disp: , Rfl:    fish oil-omega-3 fatty acids 1000 MG capsule, Take 1 g by mouth 3 (three) times daily. , Disp: , Rfl:    furosemide (LASIX) 20 MG tablet, TAKE 20 MG EVERY OTHER DAY ALTERNATING WITH 40 MG EVERY OTHER DAY, Disp: 135 tablet, Rfl: 1   latanoprost (XALATAN) 0.005 % ophthalmic solution, Place 1 drop into both eyes at bedtime., Disp: , Rfl:    lisinopril (ZESTRIL) 40 MG tablet, TAKE 1 TABLET EVERY DAY ( DOSE INCREASE ), Disp: 90 tablet, Rfl: 3   magnesium oxide (MAG-OX) 400 MG tablet, Take 1 tablet (400 mg total) by mouth  2 (two) times daily. (Patient taking differently: Take 400 mg by mouth daily.), Disp: 180 tablet, Rfl: 3   meclizine (ANTIVERT) 25 MG tablet, Take 25 mg by mouth 3 (three) times daily as needed for dizziness. , Disp: , Rfl:    metFORMIN (GLUCOPHAGE) 1000 MG tablet, Take 1,000 mg by mouth daily. , Disp: , Rfl:    Multiple Vitamins-Minerals (CENTRUM SILVER 50+WOMEN) TABS, Take 1 tablet by mouth daily. , Disp: , Rfl:    omeprazole (PRILOSEC) 40 MG capsule, Take 40 mg by mouth daily., Disp: , Rfl:    potassium chloride SA (KLOR-CON) 20 MEQ tablet, Take 1 tablet (20 mEq total) by mouth daily. TAKE 2 TABLETS DAILY FOR 3 DAYS, Disp: 30 tablet, Rfl: 1   vitamin B-12 (CYANOCOBALAMIN) 500 MCG tablet, Take 500 mcg by mouth daily., Disp: , Rfl:   Past Medical History: Past Medical History:  Diagnosis Date   Anxiety neurosis    Arthritis    Asthma    CAD (coronary artery disease)    a. 08/2017: abnormal nuc-> sent directly to [facility], NSTEMI, s/p CABG (left internal mammary artery to left anterior descending, sequential saphenous vein graft to ramus intermedius branches 1 and 2).   Carotid artery disease (HCC)    Chronic anemia    Dizziness    GERD (  gastroesophageal reflux disease)    Glaucoma    Heart disease    Hiatal hernia    Hyperlipemia    Hypertension    Ischemic cardiomyopathy    Joint pain    Mitral regurgitation    Postoperative atrial fibrillation ([place]) 08/2017   Pulmonary embolus (HCC)    Type 2 diabetes mellitus (HCC)     Tobacco Use: Social History   Tobacco Use  Smoking Status Former   Packs/day: 0.50   Years: 35.00   Pack years: 17.50   Types: Cigarettes   Quit date: 03/25/1978   Years since quitting: 42.4  Smokeless Tobacco Never  Tobacco Comments   quit more than 30 years ago    Labs: Recent Review Flowsheet Data     Labs for ITP Cardiac and Pulmonary Rehab Latest Ref Rng & Units 05/12/2019 05/13/2019 05/13/2019 05/14/2019 07/21/2020   Cholestrol 0 - 200 mg/dL - - - - -    LDLCALC 0 - 99 mg/dL - - - - -   HDL >40 mg/dL - - - - -   Trlycerides <150 mg/dL - - - - -   Hemoglobin A1c 4.8 - 5.6 % 7.2(H) - - - 7.0(H)   PHART 7.350 - 7.450 - 7.285(L) - 7.376 -   PCO2ART 32.0 - 48.0 mmHg - 44.8 - 43.2 -   HCO3 20.0 - 28.0 mmol/L - 21.3 - 25.4 -   TCO2 22 - 32 mmol/L - 23 23 27  -   ACIDBASEDEF 0.0 - 2.0 mmol/L - 5.0(H) - - -   O2SAT % - 94.0 - 97.0 -       Capillary Blood Glucose: Lab Results  Component Value Date   GLUCAP 131 (H) 09/12/2020   GLUCAP 181 (H) 07/21/2020   GLUCAP 113 (H) 07/21/2020   GLUCAP 154 (H) 05/16/2019   GLUCAP 108 (H) 05/16/2019    POCT Glucose     Row Name 09/12/20 1410             POCT Blood Glucose   Pre-Exercise 131 mg/dL                Pulmonary Assessment Scores:  Pulmonary Assessment Scores     Row Name 09/04/20 1238         ADL UCSD   ADL Phase Entry     SOB Score total 99           CAT Score     CAT Score 27           mMRC Score     mMRC Score 4            UCSD: Self-administered rating of dyspnea associated with activities of daily living (ADLs) 6-point scale (0 = "not at all" to 5 = "maximal or unable to do because of breathlessness")  Scoring Scores range from 0 to 120.  Minimally important difference is 5 units  CAT: CAT can identify the health impairment of COPD patients and is better correlated with disease progression.  CAT has a scoring range of zero to 40. The CAT score is classified into four groups of low (less than 10), medium (10 - 20), high (21-30) and very high (31-40) based on the impact level of disease on health status. A CAT score over 10 suggests significant symptoms.  A worsening CAT score could be explained by an exacerbation, poor medication adherence, poor inhaler technique, or progression of COPD or comorbid conditions.  CAT MCID is 2 points  mMRC: mMRC (Modified Medical Research Council) Dyspnea Scale is used to assess the degree of baseline functional  disability in patients of respiratory disease due to dyspnea. No minimal important difference is established. A decrease in score of 1 point or greater is considered a positive change.   Pulmonary Function Assessment:   Exercise Target Goals: Exercise Program Goal: Individual exercise prescription set using results from initial 6 min walk test and THRR while considering  patient's activity barriers and safety.   Exercise Prescription Goal: Initial exercise prescription builds to 30-45 minutes a day of aerobic activity, 2-3 days per week.  Home exercise guidelines will be given to patient during program as part of exercise prescription that the participant will acknowledge.  Activity Barriers & Risk Stratification:  Activity Barriers & Cardiac Risk Stratification - 09/04/20 1246       Activity Barriers & Cardiac Risk Stratification   Activity Barriers Arthritis;Back Problems;Left Knee Replacement;Neck/Spine Problems;Joint Problems;Deconditioning;Muscular Weakness;Shortness of Breath;Balance Concerns    Cardiac Risk Stratification High             6 Minute Walk:  6 Minute Walk     Row Name 09/12/20 1309         6 Minute Walk   Phase Initial     Distance 250 feet     Walk Time 6 minutes     # of Rest Breaks 1     MPH 0.47     METS 0.51     RPE 9     Perceived Dyspnea  13     VO2 Peak 1.78     Symptoms Yes (comment)     Comments seated break for 1 minute due to left knee pain 7/10     Resting HR 64 bpm     Resting BP 120/60     Resting Oxygen Saturation  100 %     Exercise Oxygen Saturation  during 6 min walk 99 %     Max Ex. HR 95 bpm     Max Ex. BP 134/50     2 Minute Post BP 130/52           Interval HR     1 Minute HR 83     2 Minute HR 85     3 Minute HR 95     4 Minute HR 94     5 Minute HR 80     6 Minute HR 85     2 Minute Post HR 66     Interval Heart Rate? Yes           Interval Oxygen     Interval Oxygen? Yes     Baseline Oxygen Saturation %  100 %     1 Minute Oxygen Saturation % 100 %     1 Minute Liters of Oxygen 2 L     2 Minute Oxygen Saturation % 100 %     2 Minute Liters of Oxygen 2 L     3 Minute Oxygen Saturation % 100 %     3 Minute Liters of Oxygen 2 L     4 Minute Oxygen Saturation % 99 %     4 Minute Liters of Oxygen 2 L     5 Minute Oxygen Saturation % 100 %     5 Minute Liters of Oxygen 2 L     6 Minute Oxygen Saturation % 99 %     6 Minute Liters of Oxygen 2 L  2 Minute Post Oxygen Saturation % 100 %     2 Minute Post Liters of Oxygen 2 L             Oxygen Initial Assessment:  Oxygen Initial Assessment - 09/12/20 1309       Initial 6 min Walk   Oxygen Used Continuous    Liters per minute 2      Program Oxygen Prescription   Program Oxygen Prescription Continuous    Liters per minute 2      Intervention   Short Term Goals To learn and exhibit compliance with exercise, home and travel O2 prescription;To learn and understand importance of monitoring SPO2 with pulse oximeter and demonstrate accurate use of the pulse oximeter.;To learn and understand importance of maintaining oxygen saturations>88%;To learn and demonstrate proper pursed lip breathing techniques or other breathing techniques. ;To learn and demonstrate proper use of respiratory medications    Long  Term Goals Exhibits compliance with exercise, home  and travel O2 prescription;Verbalizes importance of monitoring SPO2 with pulse oximeter and return demonstration;Maintenance of O2 saturations>88%;Exhibits proper breathing techniques, such as pursed lip breathing or other method taught during program session;Compliance with respiratory medication             Oxygen Re-Evaluation:   Oxygen Discharge (Final Oxygen Re-Evaluation):   Initial Exercise Prescription:   Perform Capillary Blood Glucose checks as needed.  Exercise Prescription Changes:   Exercise Comments:   Exercise Goals and Review:   Exercise Goals     Row  Name 09/12/20 1408             Exercise Goals   Increase Physical Activity Yes       Intervention Provide advice, education, support and counseling about physical activity/exercise needs.;Develop an individualized exercise prescription for aerobic and resistive training based on initial evaluation findings, risk stratification, comorbidities and participant's personal goals.       Expected Outcomes Short Term: Attend rehab on a regular basis to increase amount of physical activity.;Long Term: Add in home exercise to make exercise part of routine and to increase amount of physical activity.;Long Term: Exercising regularly at least 3-5 days a week.       Increase Strength and Stamina Yes       Intervention Provide advice, education, support and counseling about physical activity/exercise needs.;Develop an individualized exercise prescription for aerobic and resistive training based on initial evaluation findings, risk stratification, comorbidities and participant's personal goals.       Expected Outcomes Short Term: Increase workloads from initial exercise prescription for resistance, speed, and METs.;Short Term: Perform resistance training exercises routinely during rehab and add in resistance training at home;Long Term: Improve cardiorespiratory fitness, muscular endurance and strength as measured by increased METs and functional capacity (6MWT)       Able to understand and use rate of perceived exertion (RPE) scale Yes       Intervention Provide education and explanation on how to use RPE scale       Expected Outcomes Short Term: Able to use RPE daily in rehab to express subjective intensity level;Long Term:  Able to use RPE to guide intensity level when exercising independently       Able to understand and use Dyspnea scale Yes       Intervention Provide education and explanation on how to use Dyspnea scale       Expected Outcomes Short Term: Able to use Dyspnea scale daily in rehab to express  subjective sense of shortness  of breath during exertion;Long Term: Able to use Dyspnea scale to guide intensity level when exercising independently       Knowledge and understanding of Target Heart Rate Range (THRR) Yes       Intervention Provide education and explanation of THRR including how the numbers were predicted and where they are located for reference       Expected Outcomes Short Term: Able to state/look up THRR;Long Term: Able to use THRR to govern intensity when exercising independently;Short Term: Able to use daily as guideline for intensity in rehab       Understanding of Exercise Prescription Yes       Intervention Provide education, explanation, and written materials on patient's individual exercise prescription       Expected Outcomes Short Term: Able to explain program exercise prescription;Long Term: Able to explain home exercise prescription to exercise independently                Exercise Goals Re-Evaluation :   Discharge Exercise Prescription (Final Exercise Prescription Changes):   Nutrition:  Target Goals: Understanding of nutrition guidelines, daily intake of sodium 1500mg , cholesterol 200mg , calories 30% from fat and 7% or less from saturated fats, daily to have 5 or more servings of fruits and vegetables.  Biometrics:  Pre Biometrics - 09/12/20 1409       Pre Biometrics   Height 5\' 4"  (1.626 m)    Weight 176 lb 12.9 oz (80.2 kg)    Waist Circumference 45 inches    Hip Circumference 46.5 inches    Waist to Hip Ratio 0.97 %    BMI (Calculated) 30.33    Triceps Skinfold 29 mm    % Body Fat 44.8 %    Grip Strength 22 kg    Flexibility 0 in    Single Leg Stand 0 seconds              Nutrition Therapy Plan and Nutrition Goals:  Nutrition Therapy & Goals - 09/06/20 0734       Personal Nutrition Goals   Comments Patient scored 68 on her diet assessment. We offer 2 educational sessions on heart heathy nutrition with handouts and assistance with  RD referral if interested.      Intervention Plan   Intervention Nutrition handout(s) given to patient.             Nutrition Assessments:  Nutrition Assessments - 09/04/20 1241       MEDFICTS Scores   Pre Score 68            MEDIFICTS Score Key: ?70 Need to make dietary changes  40-70 Heart Healthy Diet ? 40 Therapeutic Level Cholesterol Diet   Picture Your Plate Scores: <33 Unhealthy dietary pattern with much room for improvement. 41-50 Dietary pattern unlikely to meet recommendations for good health and room for improvement. 51-60 More healthful dietary pattern, with some room for improvement.  >60 Healthy dietary pattern, although there may be some specific behaviors that could be improved.    Nutrition Goals Re-Evaluation:   Nutrition Goals Discharge (Final Nutrition Goals Re-Evaluation):   Psychosocial: Target Goals: Acknowledge presence or absence of significant depression and/or stress, maximize coping skills, provide positive support system. Participant is able to verbalize types and ability to use techniques and skills needed for reducing stress and depression.  Initial Review & Psychosocial Screening:  Initial Psych Review & Screening - 09/04/20 1239       Initial Review   Current issues with None Identified  Family Dynamics   Good Support System? Yes    Comments Her daughter supports her. Her husband supports her as well by taking her to all of her appointments.      Barriers   Psychosocial barriers to participate in program There are no identifiable barriers or psychosocial needs.      Screening Interventions   Interventions Encouraged to exercise    Expected Outcomes Long Term goal: The participant improves quality of Life and PHQ9 Scores as seen by post scores and/or verbalization of changes;Short Term goal: Identification and review with participant of any Quality of Life or Depression concerns found by scoring the questionnaire.;Long  Term Goal: Stressors or current issues are controlled or eliminated.             Quality of Life Scores:  Quality of Life - 09/04/20 1311       Quality of Life   Select Quality of Life      Quality of Life Scores   Health/Function Pre 8.97 %    Socioeconomic Pre 22.7 %    Psych/Spiritual Pre 20.29 %    Family Pre 8.5 %    GLOBAL Pre 13.38 %            Scores of 19 and below usually indicate a poorer quality of life in these areas.  A difference of  2-3 points is a clinically meaningful difference.  A difference of 2-3 points in the total score of the Quality of Life Index has been associated with significant improvement in overall quality of life, self-image, physical symptoms, and general health in studies assessing change in quality of life.   PHQ-9: Recent Review Flowsheet Data     Depression screen [facility] 2/9 09/04/2020 06/29/2019   Decreased Interest 3  0   Down, Depressed, Hopeless 1 0   PHQ - 2 Score 4 0   Altered sleeping 1  -   Tired, decreased energy 3 -   Change in appetite 3 -   Feeling bad or failure about yourself  1 -   Trouble concentrating 1 -   Moving slowly or fidgety/restless 0 -   Suicidal thoughts 0 -   PHQ-9 Score 13 -   Difficult doing work/chores Very difficult -      Interpretation of Total Score  Total Score Depression Severity:  1-4 = Minimal depression, 5-9 = Mild depression, 10-14 = Moderate depression, 15-19 = Moderately severe depression, 20-27 = Severe depression   Psychosocial Evaluation and Intervention:  Psychosocial Evaluation - 09/04/20 1311       Psychosocial Evaluation & Interventions   Interventions Encouraged to exercise with the program and follow exercise prescription    Comments Pt has no identifiable barriers to completing pulmonary rehab. She states that she has no identifiable psychosoical barriers, but she did score a 13 on her PHQ-9. A year and a half ago she had a lymph node removed from her lung, and she states  that she has had constant fatigue, weakness, and SOB since. She is unable to do her former hobbies, cleaning, or cooking now. She is bothered by this and by the amount of pain that she experiences. She states that she does not need professional mental health help and that she copes well on her own. She states that her support system is primarily her daughter and her husband. Her goals while in the program are to get stronger and to decrease her SOB.    Expected Outcomes The patient will decrease  her PHQ-9 values at discharge.    Continue Psychosocial Services  No Follow up required             Psychosocial Re-Evaluation:   Psychosocial Discharge (Final Psychosocial Re-Evaluation):    Education: Education Goals: Education classes will be provided on a weekly basis, covering required topics. Participant will state understanding/return demonstration of topics presented.  Learning Barriers/Preferences:  Learning Barriers/Preferences - 09/04/20 1242       Learning Barriers/Preferences   Learning Barriers None    Learning Preferences Skilled Demonstration             Education Topics: How Lungs Work and Diseases: - Discuss the anatomy of the lungs and diseases that can affect the lungs, such as COPD.   Exercise: -Discuss the importance of exercise, FITT principles of exercise, normal and abnormal responses to exercise, and how to exercise safely.   Environmental Irritants: -Discuss types of environmental irritants and how to limit exposure to environmental irritants.   Meds/Inhalers and oxygen: - Discuss respiratory medications, definition of an inhaler and oxygen, and the proper way to use an inhaler and oxygen.   Energy Saving Techniques: - Discuss methods to conserve energy and decrease shortness of breath when performing activities of daily living.    Bronchial Hygiene / Breathing Techniques: - Discuss breathing mechanics, pursed-lip breathing technique,  proper  posture, effective ways to clear airways, and other functional breathing techniques   Cleaning Equipment: - Provides group verbal and written instruction about the health risks of elevated stress, cause of high stress, and healthy ways to reduce stress.   Nutrition I: Fats: - Discuss the types of cholesterol, what cholesterol does to the body, and how cholesterol levels can be controlled.   Nutrition II: Labels: -Discuss the different components of food labels and how to read food labels.   Respiratory Infections: - Discuss the signs and symptoms of respiratory infections, ways to prevent respiratory infections, and the importance of seeking medical treatment when having a respiratory infection.   Stress I: Signs and Symptoms: - Discuss the causes of stress, how stress may lead to anxiety and depression, and ways to limit stress.   Stress II: Relaxation: -Discuss relaxation techniques to limit stress.   Oxygen for Home/Travel: - Discuss how to prepare for travel when on oxygen and proper ways to transport and store oxygen to ensure safety.   Knowledge Questionnaire Score:  Knowledge Questionnaire Score - 09/04/20 1243       Knowledge Questionnaire Score   Pre Score 16/18             Core Components/Risk Factors/Patient Goals at Admission:  Personal Goals and Risk Factors at Admission - 09/04/20 1245       Core Components/Risk Factors/Patient Goals on Admission    Weight Management Yes;Weight Maintenance    Intervention Weight Management: Develop a combined nutrition and exercise program designed to reach desired caloric intake, while maintaining appropriate intake of nutrient and fiber, sodium and fats, and appropriate energy expenditure required for the weight goal.;Weight Management: Provide education and appropriate resources to help participant work on and attain dietary goals.    Expected Outcomes Short Term: Continue to assess and modify interventions until  short term weight is achieved;Long Term: Adherence to nutrition and physical activity/exercise program aimed toward attainment of established weight goal;Weight Maintenance: Understanding of the daily nutrition guidelines, which includes 25-35% calories from fat, 7% or less cal from saturated fats, less than 200mg  cholesterol, less than 1.5gm of sodium, &  5 or more servings of fruits and vegetables daily    Improve shortness of breath with ADL's Yes    Intervention Provide education, individualized exercise plan and daily activity instruction to help decrease symptoms of SOB with activities of daily living.    Expected Outcomes Short Term: Improve cardiorespiratory fitness to achieve a reduction of symptoms when performing ADLs;Long Term: Be able to perform more ADLs without symptoms or delay the onset of symptoms    Diabetes Yes    Intervention Provide education about signs/symptoms and action to take for hypo/hyperglycemia.;Provide education about proper nutrition, including hydration, and aerobic/resistive exercise prescription along with prescribed medications to achieve blood glucose in normal ranges: Fasting glucose 65-99 mg/dL    Expected Outcomes Long Term: Attainment of HbA1C < 7%.;Short Term: Participant verbalizes understanding of the signs/symptoms and immediate care of hyper/hypoglycemia, proper foot care and importance of medication, aerobic/resistive exercise and nutrition plan for blood glucose control.    Heart Failure Yes    Intervention Provide a combined exercise and nutrition program that is supplemented with education, support and counseling about heart failure. Directed toward relieving symptoms such as shortness of breath, decreased exercise tolerance, and extremity edema.    Expected Outcomes Improve functional capacity of life;Short term: Attendance in program 2-3 days a week with increased exercise capacity. Reported lower sodium intake. Reported increased fruit and vegetable  intake. Reports medication compliance.;Short term: Daily weights obtained and reported for increase. Utilizing diuretic protocols set by physician.;Long term: Adoption of self-care skills and reduction of barriers for early signs and symptoms recognition and intervention leading to self-care maintenance.    Hypertension Yes    Intervention Provide education on lifestyle modifcations including regular physical activity/exercise, weight management, moderate sodium restriction and increased consumption of fresh fruit, vegetables, and low fat dairy, alcohol moderation, and smoking cessation.;Monitor prescription use compliance.    Expected Outcomes Short Term: Continued assessment and intervention until BP is < 140/77mm HG in hypertensive participants. < 130/54mm HG in hypertensive participants with diabetes, heart failure or chronic kidney disease.;Long Term: Maintenance of blood pressure at goal levels.    Lipids Yes    Intervention Provide education and support for participant on nutrition & aerobic/resistive exercise along with prescribed medications to achieve LDL 70mg , HDL >40mg .    Expected Outcomes Short Term: Participant states understanding of desired cholesterol values and is compliant with medications prescribed. Participant is following exercise prescription and nutrition guidelines.;Long Term: Cholesterol controlled with medications as prescribed, with individualized exercise RX and with personalized nutrition plan. Value goals: LDL < 70mg , HDL > 40 mg.             Core Components/Risk Factors/Patient Goals Review:    Core Components/Risk Factors/Patient Goals at Discharge (Final Review):    ITP Comments:   Comments: Patient arrived for 1st visit/orientation/education at 1200. She had previously completed orientation but had not did her walk test. She wore flip flops to her orientation and was not allowed to walk that day. She returned today to complete her walk test. Patient was  referred to PR by Dr. Harl Bowie due to Chronic congestive heart failure (I50.9). During orientation advised patient on arrival and appointment times what to wear, what to do before, during and after exercise. Reviewed attendance and class policy.  Pt is scheduled to return Pulmonary Rehab on 09/14/2020 at 1045. Pt was advised to come to class 15 minutes before class starts.  Discussed RPE/Dpysnea scales. Patient participated in warm up stretches. Patient was able to complete  6 minute walk test. Patient was measured for the equipment. Discussed equipment safety with patient. Took patient pre-anthropometric measurements. Patient finished visit at 1230.

## 2020-09-13 DIAGNOSIS — U071 COVID-19: Secondary | ICD-10-CM | POA: Diagnosis not present

## 2020-09-14 ENCOUNTER — Encounter (HOSPITAL_COMMUNITY)
Admission: RE | Admit: 2020-09-14 | Discharge: 2020-09-14 | Disposition: A | Discharge status: Home / Self Care | Payer: Medicare HMO | Source: Ambulatory Visit | Attending: Cardiology | Admitting: Cardiology

## 2020-09-14 ENCOUNTER — Encounter (HOSPITAL_COMMUNITY): Payer: Medicare HMO

## 2020-09-14 ENCOUNTER — Other Ambulatory Visit: Payer: Self-pay

## 2020-09-14 DIAGNOSIS — R0609 Other forms of dyspnea: Secondary | ICD-10-CM

## 2020-09-14 DIAGNOSIS — I509 Heart failure, unspecified: Secondary | ICD-10-CM | POA: Diagnosis not present

## 2020-09-14 DIAGNOSIS — R0600 Dyspnea, unspecified: Secondary | ICD-10-CM

## 2020-09-14 LAB — GLUCOSE, CAPILLARY: Glucose-Capillary: 111 mg/dL — ABNORMAL HIGH (ref 70–99)

## 2020-09-14 NOTE — Progress Notes (Addendum)
Daily Session Note  Patient Details  Name: CALEIGHA ZALE MRN: [id number] Date of Birth: 1945-05-31 Referring Provider:    Encounter Date: 09/14/2020  Check In:  Session Check In - 09/14/20 1043       Check-In   Supervising physician immediately available to respond to emergencies [facility] MD immediately available    Physician(s) Dr. Harrington Challenger    Location [facility]    Staff Present Aundra Dubin, RN, Bjorn Loser, MS, ACSM-CEP, Exercise Physiologist    Virtual Visit No    Medication changes reported     No    Fall or balance concerns reported    Yes    Comments needs a knee replacement and has diabetic neuropathy    Tobacco Cessation No Change    Warm-up and Cool-down Performed as group-led instruction    Resistance Training Performed No    VAD Patient? No    PAD/SET Patient? No      Pain Assessment   Currently in Pain? Yes    Pain Score 6     Pain Location Leg    Pain Orientation Left    Pain Descriptors / Indicators Aching;Constant    Pain Type Chronic pain    Pain Onset More than a month ago    Pain Frequency Constant    Aggravating Factors  Weight bearing.    Pain Relieving Factors Patient uses muscle rubs and Tylenol at times but says nothing really helps.    Effect of Pain on Daily Activities Limits mobility and walking at times.    Multiple Pain Sites No             Capillary Blood Glucose: Results for orders placed or performed during the hospital encounter of 09/14/20 (from the past 24 hour(s))  Glucose, capillary     Status: Abnormal   Collection Time: 09/14/20 10:41 AM  Result Value Ref Range   Glucose-Capillary 111 (H) 70 - 99 mg/dL      Social History   Tobacco Use  Smoking Status Former   Packs/day: 0.50   Years: 35.00   Pack years: 17.50   Types: Cigarettes   Quit date: 03/25/1978   Years since quitting: 42.5  Smokeless Tobacco Never  Tobacco Comments   quit more than 30 years ago    Goals Met:  Proper associated with  RPD/PD & O2 Sat Independence with exercise equipment Using PLB without cueing & demonstrates good technique Exercise tolerated well No report of cardiac concerns or symptoms Strength training completed today  Goals Unmet:  Not Applicable  Comments: Check out 1145.   Dr. Kathie Dike is Medical Director for [facility] Pul[facility].

## 2020-09-15 ENCOUNTER — Telehealth: Payer: Self-pay | Admitting: Cardiology

## 2020-09-15 NOTE — Telephone Encounter (Signed)
Patient called  wanting to know if she can use the medication  Voltaren gel for arthritis.

## 2020-09-15 NOTE — Telephone Encounter (Signed)
Verbalized to pt that she may use the Voltaren gel for arthritis. She voiced understanding.

## 2020-09-19 ENCOUNTER — Encounter (HOSPITAL_COMMUNITY)
Admission: RE | Admit: 2020-09-19 | Discharge: 2020-09-19 | Disposition: A | Discharge status: Home / Self Care | Payer: Medicare HMO | Source: Ambulatory Visit | Attending: Cardiology | Admitting: Cardiology

## 2020-09-19 ENCOUNTER — Other Ambulatory Visit: Payer: Self-pay

## 2020-09-19 ENCOUNTER — Encounter (HOSPITAL_COMMUNITY): Payer: Self-pay | Admitting: Physician Assistant

## 2020-09-19 ENCOUNTER — Inpatient Hospital Stay (HOSPITAL_COMMUNITY): Payer: Medicare HMO | Attending: Hematology | Admitting: Physician Assistant

## 2020-09-19 DIAGNOSIS — D649 Anemia, unspecified: Secondary | ICD-10-CM

## 2020-09-19 DIAGNOSIS — C3491 Malignant neoplasm of unspecified part of right bronchus or lung: Secondary | ICD-10-CM

## 2020-09-19 DIAGNOSIS — E538 Deficiency of other specified B group vitamins: Secondary | ICD-10-CM

## 2020-09-19 DIAGNOSIS — R0600 Dyspnea, unspecified: Secondary | ICD-10-CM | POA: Diagnosis not present

## 2020-09-19 DIAGNOSIS — I509 Heart failure, unspecified: Secondary | ICD-10-CM | POA: Diagnosis not present

## 2020-09-19 MED ORDER — FOLIC ACID 1 MG PO TABS: 1.0000 mg | ORAL_TABLET | Freq: Every day | ORAL | 2 refills | Status: DC

## 2020-09-19 NOTE — Progress Notes (Signed)
Daily Session Note  Patient Details  Name: Karen Tate MRN: [id number] Date of Birth: 03-30-1945 Referring Provider:    Encounter Date: 09/19/2020  Check In:  Session Check In - 09/19/20 1045       Check-In   Supervising physician immediately available to respond to emergencies [facility] MD immediately available    Physician(s) Dr. Harl Bowie    Location [facility]    Staff Present Hoy Register, MS, ACSM-CEP, Exercise Physiologist;Phyllis Billingsley, RN    Virtual Visit No    Medication changes reported     No    Fall or balance concerns reported    No    Tobacco Cessation No Change    Warm-up and Cool-down Performed as group-led instruction    Resistance Training Performed Yes    VAD Patient? No    PAD/SET Patient? No      Pain Assessment   Currently in Pain? Yes    Pain Score 8     Pain Location Leg    Pain Orientation Left    Pain Descriptors / Indicators Aching;Constant    Pain Type Chronic pain    Pain Onset More than a month ago    Pain Frequency Constant    Aggravating Factors  weight bearing    Pain Relieving Factors tylenol and and muscle rubs but pt states that nothing helps much    Effect of Pain on Daily Activities limits mobility and walking at times    Multiple Pain Sites No             Capillary Blood Glucose: No results found for this or any previous visit (from the past 24 hour(s)).    Social History   Tobacco Use  Smoking Status Former   Packs/day: 0.50   Years: 35.00   Pack years: 17.50   Types: Cigarettes   Quit date: 03/25/1978   Years since quitting: 42.5  Smokeless Tobacco Never  Tobacco Comments   quit more than 30 years ago    Goals Met:  Independence with exercise equipment Exercise tolerated well No report of cardiac concerns or symptoms Strength training completed today  Goals Unmet:  Not Applicable  Comments: checkout time is 1145   Dr. Kathie Dike is Medical Director for [facility] Pulmonary  Rehab.

## 2020-09-19 NOTE — Progress Notes (Signed)
Virtual Visit via Telephone Note [facility]  I connected with Karen Tate  on 09/19/20  at  3:10 PM  by telephone and verified that I am speaking with the correct person using two identifiers.  Location: Patient: Home Provider: [facility]   I discussed the limitations, risks, security and privacy concerns of performing an evaluation and management service by telephone and the availability of in person appointments. I also discussed with the patient that there may be a patient responsible charge related to this service. The patient expressed understanding and agreed to proceed.   History of Present Illness: Karen Tate follows at our clinic for history of stage I right lung adenocarcinoma as well as iron deficiency anemia.  She was last seen in clinic by Tarri Abernethy PA-C on 08/18/2020.  She is doing fairly well today.  Her biggest concern is her chronic pain and peripheral neuropathy. She has had increasing leg cramps over the past month, which she says improve somewhat after she gets IV iron.  Her energy is fair, about 75%, but her fatigue has not resolved since surgery in November 2021.  She previously had some weight loss after her lung surgery, but reports that her weight has now stabilized and her appetite has improved.  She has some chronic dyspnea on exertion, is being worked up by pulmonology for possible COPD versus asthma.  She also follows with cardiology  She continues to deny any signs or symptoms of blood loss - no epistaxis, hematochezia, hemoptysis, hematemesis, or melena.     Observations/Objective: Review of Systems  Constitutional:  Positive for malaise/fatigue. Negative for chills, diaphoresis, fever and weight loss.  Respiratory:  Positive for shortness of breath (With exertion). Negative for cough.   Cardiovascular:  Negative for chest pain and palpitations.  Gastrointestinal:  Negative for abdominal pain, blood in stool, melena,  nausea and vomiting.  Neurological:  Positive for tingling. Negative for dizziness and headaches.    PHYSICAL EXAM (per limitations of virtual telephone visit): The patient is alert and oriented x 3, exhibiting adequate mentation, good mood, and ability to speak in full sentences and execute sound judgement.   Assessment: 1.  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 -PET CT scan on 04/26/2019 showed mildly hypermetabolic 1.1 cm right upper lobe pulmonary nodule with SUV 2.4.  No findings of adenopathy or metastatic disease. -Right upper lobectomy on 05/13/2019 shows 1.3 cm invasive adenocarcinoma, negative for visceral pleural invasion, LVI negative, margins negative.  Lymph nodes from stations 7, 11 R, 10 R, 4R, 12R are negative. -CT chest surveillance every 6 months for 2 to 3 years was recommended followed by noncontrast CT annually. -CT chest on 10/27/2019 showed surgical changes in the right upper lobe lobectomy with no findings of recurrence.  2 small indeterminate right lower lobe pulmonary nodules, follow-up recommended. -CT chest on 05/04/2020 with post right upper lobe resection with no evidence of recurrence or metastasis.  Clearing of bilateral pleural effusions. - PLAN: Repeat CT chest in August.  MD visit after scan.   2.  Normocytic anemia: - She denies any signs or symptoms of bleeding, no gross rectal hemorrhage or melena - Colonoscopy earlier this year was reportedly normal. - Stool NEGATIVE for occult blood x3 (May 2022) -She has mild degree  of chronic kidney disease which is likely contributing to her anemia; most recent GFR 59 (07/21/2020) - Other work-up (SPEP, LDH, immunofixation) was unremarkable -Last Feraheme on 02/11/2020 and 02/21/2020; takes ferrous sulfate 325 mg daily at  home -Most recent labs show improved Hgb 11.1 (08/18/2020); iron panel on 07/26/2020 shows ferritin 86, serum iron 32 and iron saturation 12% - PLAN: Patient is symptomatic (RLS, DOE, fatigue) with persistent mild iron deficiency despite repletion - will proceed with IV iron (Venofer x3).  RTC in 3 months with repeat CBC and iron panel.   3.  Weight loss: -She lost about 20-25 pounds since her surgery. -She is slowly gaining back.  Her appetite is improving. - Weight today is reported to be stable. - PLAN: Continue to monitor weight and appetite at next appointment.  4.  Vitamin B12 deficiency: -Most recent B12 and methylmalonic acid normal (08/18/2020) -PLAN: Continue daily B12 tablet.  5.  Folate deficiency - Mild folate deficiency noted on 08/18/2020 with folate 5.7 - PLAN: Start folic acid 1 mg daily    Follow Up Instructions: - CT scan in 2 months - IV Venofer x3 - Labs and RTC in 3 months (MD visit) - Start folic acid supplement.    I discussed the assessment and treatment plan with the patient. The patient was provided an opportunity to ask questions and all were answered. The patient agreed with the plan and demonstrated an understanding of the instructions.   The patient was advised to call back or seek an in-person evaluation if the symptoms worsen or if the condition fails to improve as anticipated.  I provided 15 minutes of non-face-to-face time during this encounter.   Harriett Rush, PA-C 09/19/20 3:53 PM

## 2020-09-19 NOTE — Addendum Note (Signed)
Addended by: Tarri Abernethy on: 09/19/2020 04:40 PM   Modules accepted: Orders

## 2020-09-21 ENCOUNTER — Encounter (HOSPITAL_COMMUNITY)
Admission: RE | Admit: 2020-09-21 | Discharge: 2020-09-21 | Disposition: A | Discharge status: Home / Self Care | Payer: Medicare HMO | Source: Ambulatory Visit | Attending: Cardiology | Admitting: Cardiology

## 2020-09-21 ENCOUNTER — Other Ambulatory Visit: Payer: Self-pay

## 2020-09-21 DIAGNOSIS — I509 Heart failure, unspecified: Secondary | ICD-10-CM | POA: Diagnosis not present

## 2020-09-21 DIAGNOSIS — E1142 Type 2 diabetes mellitus with diabetic polyneuropathy: Secondary | ICD-10-CM | POA: Diagnosis not present

## 2020-09-21 DIAGNOSIS — E7849 Other hyperlipidemia: Secondary | ICD-10-CM | POA: Diagnosis not present

## 2020-09-21 DIAGNOSIS — I10 Essential (primary) hypertension: Secondary | ICD-10-CM | POA: Diagnosis not present

## 2020-09-21 DIAGNOSIS — I5022 Chronic systolic (congestive) heart failure: Secondary | ICD-10-CM | POA: Diagnosis not present

## 2020-09-21 DIAGNOSIS — R0600 Dyspnea, unspecified: Secondary | ICD-10-CM | POA: Diagnosis not present

## 2020-09-21 NOTE — Progress Notes (Signed)
Daily Session Note  Patient Details  Name: Karen Tate MRN: [id number] Date of Birth: [birth year] Referring Provider:    Encounter Date: 09/21/2020  Check In:  Session Check In - 09/21/20 1045       Check-In   Supervising physician immediately available to respond to emergencies [facility] MD immediately available    Physician(s) Dr. Domenic Polite    Location [facility]    Staff Present Hoy Register, MS, ACSM-CEP, Exercise Physiologist    Virtual Visit No    Medication changes reported     No    Fall or balance concerns reported    No    Tobacco Cessation No Change    Warm-up and Cool-down Performed as group-led instruction    Resistance Training Performed Yes    VAD Patient? No    PAD/SET Patient? No      Pain Assessment   Currently in Pain? No/denies    Multiple Pain Sites No             Capillary Blood Glucose: No results found for this or any previous visit (from the past 24 hour(s)).    Social History   Tobacco Use  Smoking Status Former   Packs/day: 0.50   Years: 35.00   Pack years: 17.50   Types: Cigarettes   Quit date: 03/25/1978   Years since quitting: 42.5  Smokeless Tobacco Never  Tobacco Comments   quit more than 30 years ago    Goals Met:  Independence with exercise equipment Exercise tolerated well No report of cardiac concerns or symptoms Strength training completed today  Goals Unmet:  Not Applicable  Comments: checkout time is 1145   Dr. Kathie Dike is Medical Director for [facility] Pulmonary Rehab.

## 2020-09-22 ENCOUNTER — Inpatient Hospital Stay (HOSPITAL_COMMUNITY): Payer: Medicare HMO | Attending: Hematology

## 2020-09-22 ENCOUNTER — Encounter (HOSPITAL_COMMUNITY): Payer: Self-pay

## 2020-09-22 ENCOUNTER — Telehealth: Payer: Self-pay | Admitting: Pulmonary Disease

## 2020-09-22 VITALS — BP 136/80 | HR 62 | Temp 97.4°F | Resp 18

## 2020-09-22 DIAGNOSIS — D649 Anemia, unspecified: Secondary | ICD-10-CM

## 2020-09-22 DIAGNOSIS — C3411 Malignant neoplasm of upper lobe, right bronchus or lung: Secondary | ICD-10-CM | POA: Insufficient documentation

## 2020-09-22 DIAGNOSIS — D509 Iron deficiency anemia, unspecified: Secondary | ICD-10-CM | POA: Insufficient documentation

## 2020-09-22 MED ORDER — LORATADINE 10 MG PO TABS
10.0000 mg | ORAL_TABLET | Freq: Once | ORAL | Status: AC
  Administered 2020-09-22: 10 mg via ORAL

## 2020-09-22 MED ORDER — SODIUM CHLORIDE 0.9 % IV SOLN
Freq: Once | INTRAVENOUS | Status: AC
Start: 2020-09-22 — End: 2020-09-22

## 2020-09-22 MED ORDER — ACETAMINOPHEN 325 MG PO TABS
ORAL_TABLET | ORAL | Status: AC
  Filled 2020-09-22: qty 2

## 2020-09-22 MED ORDER — ACETAMINOPHEN 325 MG PO TABS
650.0000 mg | ORAL_TABLET | Freq: Once | ORAL | Status: AC
  Administered 2020-09-22: 650 mg via ORAL

## 2020-09-22 MED ORDER — SODIUM CHLORIDE 0.9 % IV SOLN
300.0000 mg | Freq: Once | INTRAVENOUS | Status: AC
  Administered 2020-09-22: 300 mg via INTRAVENOUS
  Filled 2020-09-22: qty 300

## 2020-09-22 MED ORDER — LORATADINE 10 MG PO TABS
ORAL_TABLET | ORAL | Status: AC
  Filled 2020-09-22: qty 1

## 2020-09-22 NOTE — Progress Notes (Signed)
Venofer IV iron infusion  given today per MD orders. Tolerated infusion without adverse affects. Vital signs stable. No complaints at this time. Pt stable during and after treatment. Discharged from clinic via wheelchair in stable condition. Alert and oriented x 3. F/U with [facility] as scheduled.

## 2020-09-22 NOTE — Progress Notes (Signed)
Patient presents today for IV Venofer 300 mg infusion. Vital signs are stable. Patient denies any changes since the last iron infusion. Patient denies any complaints today. MAR reviewed and updated.

## 2020-09-22 NOTE — Patient Instructions (Signed)
[facility]  Discharge Instructions: Thank you for choosing [facility] to provide your oncology and hematology care.  If you have a lab appointment with the [facility], please come in thru the Main Entrance and check in at the main information desk.  Wear comfortable clothing and clothing appropriate for easy access to any Portacath or PICC line.   We strive to give you quality time with your provider. You may need to reschedule your appointment if you arrive late (15 or more minutes).  Arriving late affects you and other patients whose appointments are after yours.  Also, if you miss three or more appointments without notifying the office, you may be dismissed from the clinic at the provider's discretion.      For prescription refill requests, have your pharmacy contact our office and allow 72 hours for refills to be completed.    Today you received Venofer iron infusion   BELOW ARE SYMPTOMS THAT SHOULD BE REPORTED IMMEDIATELY: *FEVER GREATER THAN 100.4 F (38 C) OR HIGHER *CHILLS OR SWEATING *NAUSEA AND VOMITING THAT IS NOT CONTROLLED WITH YOUR NAUSEA MEDICATION *UNUSUAL SHORTNESS OF BREATH *UNUSUAL BRUISING OR BLEEDING *URINARY PROBLEMS (pain or burning when urinating, or frequent urination) *BOWEL PROBLEMS (unusual diarrhea, constipation, pain near the anus) TENDERNESS IN MOUTH AND THROAT WITH OR WITHOUT PRESENCE OF ULCERS (sore throat, sores in mouth, or a toothache) UNUSUAL RASH, SWELLING OR PAIN  UNUSUAL VAGINAL DISCHARGE OR ITCHING   Items with * indicate a potential emergency and should be followed up as soon as possible or go to the Emergency Department if any problems should occur.  Please show the CHEMOTHERAPY ALERT CARD or IMMUNOTHERAPY ALERT CARD at check-in to the Emergency Department and triage nurse.  Should you have questions after your visit or need to cancel or reschedule your appointment, please contact [facility]  [phone]  and follow the prompts.  Office hours are 8:00 a.m. to 4:30 p.m. Monday - Friday. Please note that voicemails left after 4:00 p.m. may not be returned until the following business day.  We are closed weekends and major holidays. You have access to a nurse at all times for urgent questions. Please call the main number to the clinic [phone] and follow the prompts.  For any non-urgent questions, you may also contact your provider using MyChart. We now offer e-Visits for anyone 71 and older to request care online for non-urgent symptoms. For details visit [email or web address].   Also download the MyChart app! Go to the app store, search "MyChart", open the app, select [facility], and log in with your MyChart username and password.  Due to Covid, a mask is required upon entering the hospital/clinic. If you do not have a mask, one will be given to you upon arrival. For doctor visits, patients may have 1 support person aged 45 or older with them. For treatment visits, patients cannot have anyone with them due to current Covid guidelines and our immunocompromised population.

## 2020-09-22 NOTE — Telephone Encounter (Signed)
ONO with RA 09/08/20 >> test time 6 hrs 2 min.  Basal SpO2 99%, low SpO2 96%.  Please let her know that her overnight oxygen test was normal.  She doesn't need to use supplemental oxygen at night.

## 2020-09-26 ENCOUNTER — Encounter (HOSPITAL_COMMUNITY)
Admission: RE | Admit: 2020-09-26 | Discharge: 2020-09-26 | Disposition: A | Discharge status: Home / Self Care | Payer: Medicare HMO | Source: Ambulatory Visit | Attending: Cardiology | Admitting: Cardiology

## 2020-09-26 ENCOUNTER — Other Ambulatory Visit: Payer: Self-pay

## 2020-09-26 VITALS — Wt 179.0 lb

## 2020-09-26 DIAGNOSIS — R0600 Dyspnea, unspecified: Secondary | ICD-10-CM | POA: Insufficient documentation

## 2020-09-26 DIAGNOSIS — R0609 Other forms of dyspnea: Secondary | ICD-10-CM

## 2020-09-26 DIAGNOSIS — J449 Chronic obstructive pulmonary disease, unspecified: Secondary | ICD-10-CM | POA: Diagnosis not present

## 2020-09-26 DIAGNOSIS — I509 Heart failure, unspecified: Secondary | ICD-10-CM | POA: Insufficient documentation

## 2020-09-26 NOTE — Telephone Encounter (Signed)
Spoke with patient regarding ONO results. They verbalized understanding. No further questions.  Nothing further needed at this time.

## 2020-09-26 NOTE — Progress Notes (Signed)
Daily Session Note  Patient Details  Name: Karen Tate MRN: [id number] Date of Birth: 09/11/1945 Referring Provider:    Encounter Date: 09/26/2020  Check In:  Session Check In - 09/26/20 1045       Check-In   Supervising physician immediately available to respond to emergencies [facility] MD immediately available    Physician(s) Dr. Nishan    Location [facility]    Staff Present  , RN;Dalton Fletcher, MS, ACSM-CEP, Exercise Physiologist    Virtual Visit No    Medication changes reported     No    Fall or balance concerns reported    No    Tobacco Cessation No Change    Warm-up and Cool-down Performed as group-led instruction    Resistance Training Performed Yes    VAD Patient? No    PAD/SET Patient? No      Pain Assessment   Pain Score 6     Pain Location Leg    Pain Orientation Left    Pain Descriptors / Indicators Aching    Pain Type Chronic pain    Pain Onset More than a month ago    Pain Frequency Intermittent    Multiple Pain Sites No             Capillary Blood Glucose: No results found for this or any previous visit (from the past 24 hour(s)).    Social History   Tobacco Use  Smoking Status Former   Packs/day: 0.50   Years: 35.00   Pack years: 17.50   Types: Cigarettes   Quit date: 03/25/1978   Years since quitting: 42.5  Smokeless Tobacco Never  Tobacco Comments   quit more than 30 years ago    Goals Met:  Proper associated with RPD/PD & O2 Sat Independence with exercise equipment Using PLB without cueing & demonstrates good technique Exercise tolerated well No report of cardiac concerns or symptoms Strength training completed today  Goals Unmet:  Not Applicable  Comments: check out @ 11:45   Dr. Jehanzeb Memon is Medical Director for [facility]. 

## 2020-09-28 ENCOUNTER — Encounter (HOSPITAL_COMMUNITY)
Admission: RE | Admit: 2020-09-28 | Discharge: 2020-09-28 | Disposition: A | Discharge status: Home / Self Care | Payer: Medicare HMO | Source: Ambulatory Visit | Attending: Cardiology | Admitting: Cardiology

## 2020-09-28 ENCOUNTER — Other Ambulatory Visit: Payer: Self-pay

## 2020-09-28 DIAGNOSIS — I509 Heart failure, unspecified: Secondary | ICD-10-CM

## 2020-09-28 DIAGNOSIS — R531 Weakness: Secondary | ICD-10-CM | POA: Diagnosis not present

## 2020-09-28 DIAGNOSIS — J449 Chronic obstructive pulmonary disease, unspecified: Secondary | ICD-10-CM | POA: Diagnosis not present

## 2020-09-28 DIAGNOSIS — R0600 Dyspnea, unspecified: Secondary | ICD-10-CM | POA: Diagnosis not present

## 2020-09-28 LAB — GLUCOSE, CAPILLARY: Glucose-Capillary: 143 mg/dL — ABNORMAL HIGH (ref 70–99)

## 2020-09-28 NOTE — Progress Notes (Signed)
Daily Session Note  Patient Details  Name: Karen Tate MRN: [id number] Date of Birth: [birth year] Referring Provider:    Encounter Date: 09/28/2020  Check In:  Session Check In - 09/28/20 1043       Check-In   Supervising physician immediately available to respond to emergencies [facility] MD immediately available    Physician(s) Dr. Harrington Challenger    Location [facility]    Staff Present Aundra Dubin, RN, Bjorn Loser, MS, ACSM-CEP, Exercise Physiologist    Virtual Visit No    Medication changes reported     No    Fall or balance concerns reported    Yes    Comments Uses a cane for balance.    Tobacco Cessation No Change    Warm-up and Cool-down Performed as group-led instruction    Resistance Training Performed Yes    VAD Patient? No    PAD/SET Patient? No      Pain Assessment   Currently in Pain? Yes    Pain Score 7     Pain Location Knee    Pain Orientation Left    Pain Descriptors / Indicators Aching    Pain Type Chronic pain    Pain Onset More than a month ago    Pain Frequency Intermittent    Aggravating Factors  Weight bearing    Pain Relieving Factors Tylenol and muscle rubs.    Effect of Pain on Daily Activities Limits mobility and walkin g at times.    Multiple Pain Sites No             Capillary Blood Glucose: Results for orders placed or performed during the hospital encounter of 09/28/20 (from the past 24 hour(s))  Glucose, capillary     Status: Abnormal   Collection Time: 09/28/20 10:37 AM  Result Value Ref Range   Glucose-Capillary 143 (H) 70 - 99 mg/dL   Comment 1 Document in Chart       Social History   Tobacco Use  Smoking Status Former   Packs/day: 0.50   Years: 35.00   Pack years: 17.50   Types: Cigarettes   Quit date: 03/25/1978   Years since quitting: 42.5  Smokeless Tobacco Never  Tobacco Comments   quit more than 30 years ago    Goals Met:  Proper associated with RPD/PD & O2 Sat Independence with exercise  equipment Using PLB without cueing & demonstrates good technique Exercise tolerated well No report of cardiac concerns or symptoms Strength training completed today  Goals Unmet:  Not Applicable  Comments: Check out 1145.   Dr. Kathie Dike is Medical Director for [facility] Pulmonary Rehab.

## 2020-09-29 ENCOUNTER — Inpatient Hospital Stay (HOSPITAL_COMMUNITY): Payer: Medicare HMO

## 2020-10-02 ENCOUNTER — Inpatient Hospital Stay (HOSPITAL_COMMUNITY): Payer: Medicare HMO

## 2020-10-03 ENCOUNTER — Encounter (HOSPITAL_COMMUNITY)
Admission: RE | Admit: 2020-10-03 | Discharge: 2020-10-03 | Disposition: A | Discharge status: Home / Self Care | Payer: Medicare HMO | Source: Ambulatory Visit | Attending: Cardiology | Admitting: Cardiology

## 2020-10-03 ENCOUNTER — Other Ambulatory Visit: Payer: Self-pay

## 2020-10-03 DIAGNOSIS — R0600 Dyspnea, unspecified: Secondary | ICD-10-CM

## 2020-10-03 DIAGNOSIS — J449 Chronic obstructive pulmonary disease, unspecified: Secondary | ICD-10-CM | POA: Diagnosis not present

## 2020-10-03 DIAGNOSIS — I509 Heart failure, unspecified: Secondary | ICD-10-CM | POA: Diagnosis not present

## 2020-10-03 DIAGNOSIS — R0609 Other forms of dyspnea: Secondary | ICD-10-CM

## 2020-10-03 NOTE — Progress Notes (Signed)
Daily Session Note  Patient Details  Name: Karen Tate MRN: [id number] Date of Birth: Oct 09, 1945 Referring Provider:    Encounter Date: 10/03/2020  Check In:  Session Check In - 10/03/20 1042       Check-In   Supervising physician immediately available to respond to emergencies [facility] MD immediately available    Physician(s) Dr. Domenic Polite    Location [facility]    Staff Present Geanie Cooley, RN;Dalton Fletcher, MS, ACSM-CEP, Exercise Physiologist    Virtual Visit No    Medication changes reported     No    Fall or balance concerns reported    No    Comments Uses a cane for balance.    Tobacco Cessation No Change    Warm-up and Cool-down Performed as group-led instruction    Resistance Training Performed Yes    VAD Patient? No    PAD/SET Patient? No      Pain Assessment   Currently in Pain? Yes    Pain Score 6     Pain Location Knee    Pain Orientation Left    Pain Descriptors / Indicators Aching    Pain Type Chronic pain    Pain Onset More than a month ago    Pain Frequency Intermittent    Multiple Pain Sites No             Capillary Blood Glucose: No results found for this or any previous visit (from the past 24 hour(s)).    Social History   Tobacco Use  Smoking Status Former   Packs/day: 0.50   Years: 35.00   Pack years: 17.50   Types: Cigarettes   Quit date: 03/25/1978   Years since quitting: 42.5  Smokeless Tobacco Never  Tobacco Comments   quit more than 30 years ago    Goals Met:  Proper associated with RPD/PD & O2 Sat Independence with exercise equipment Using PLB without cueing & demonstrates good technique Exercise tolerated well No report of cardiac concerns or symptoms Strength training completed today  Goals Unmet:  Not Applicable  Comments: check out @ 11:45   Dr. Kathie Dike is Medical Director for [facility] Pulmonary Rehab.

## 2020-10-05 ENCOUNTER — Encounter (HOSPITAL_COMMUNITY): Payer: Medicare HMO

## 2020-10-05 DIAGNOSIS — Z20822 Contact with and (suspected) exposure to covid-19: Secondary | ICD-10-CM | POA: Diagnosis not present

## 2020-10-05 DIAGNOSIS — J00 Acute nasopharyngitis [common cold]: Secondary | ICD-10-CM | POA: Diagnosis not present

## 2020-10-09 DIAGNOSIS — Z20822 Contact with and (suspected) exposure to covid-19: Secondary | ICD-10-CM | POA: Diagnosis not present

## 2020-10-10 ENCOUNTER — Encounter (HOSPITAL_COMMUNITY): Payer: Medicare HMO

## 2020-10-11 NOTE — Progress Notes (Signed)
Pulmonary Individual Treatment Plan  Patient Details  Name: Karen Tate MRN: [id number] Date of Birth: June 14, 1945 Referring Provider:    Initial Encounter Date:   Visit Diagnosis: Chronic congestive heart failure, unspecified heart failure type ([place])  DOE (dyspnea on exertion)  Chronic obstructive pulmonary disease, unspecified COPD type ([place])  Patient's Home Medications on Admission:   Current Outpatient Medications:    acetaminophen (TYLENOL) 500 MG tablet, Take 1,000 mg by mouth every 6 (six) hours as needed for moderate pain. , Disp: , Rfl:    albuterol (ACCUNEB) 0.63 MG/3ML nebulizer solution, Take 3 mLs by nebulization 4 (four) times daily., Disp: , Rfl:    albuterol (VENTOLIN HFA) 108 (90 Base) MCG/ACT inhaler, Inhale 2 puffs into the lungs every 6 (six) hours as needed., Disp: , Rfl:    ALPRAZolam (XANAX) 0.5 MG tablet, Take 0.5 mg by mouth 2 (two) times daily., Disp: , Rfl:    amLODipine (NORVASC) 10 MG tablet, Take 10 mg by mouth daily., Disp: , Rfl:    aspirin 81 MG tablet, Take 1 tablet (81 mg total) by mouth daily., Disp: , Rfl:    atorvastatin (LIPITOR) 80 MG tablet, Take 1 tablet (80 mg total) by mouth daily at 6 PM., Disp: 90 tablet, Rfl: 3   carvedilol (COREG) 25 MG tablet, Take 1 tablet by mouth 2 (two) times daily., Disp: , Rfl:    cholecalciferol (VITAMIN D3) 25 MCG (1000 UNIT) tablet, Take 1,000 Units by mouth daily., Disp: , Rfl:    ferrous sulfate 325 (65 FE) MG tablet, Take 325 mg by mouth 2 (two) times daily., Disp: , Rfl:    fish oil-omega-3 fatty acids 1000 MG capsule, Take 1 g by mouth 3 (three) times daily. , Disp: , Rfl:    folic acid (FOLVITE) 1 MG tablet, Take 1 tablet (1 mg total) by mouth daily., Disp: 30 tablet, Rfl: 2   furosemide (LASIX) 20 MG tablet, TAKE 20 MG EVERY OTHER DAY ALTERNATING WITH 40 MG EVERY OTHER DAY, Disp: 135 tablet, Rfl: 1   latanoprost (XALATAN) 0.005 % ophthalmic solution, Place 1 drop into both eyes at bedtime., Disp: , Rfl:     lisinopril (ZESTRIL) 40 MG tablet, TAKE 1 TABLET EVERY DAY ( DOSE INCREASE ), Disp: 90 tablet, Rfl: 3   magnesium oxide (MAG-OX) 400 MG tablet, Take 1 tablet (400 mg total) by mouth 2 (two) times daily. (Patient taking differently: Take 400 mg by mouth daily.), Disp: 180 tablet, Rfl: 3   meclizine (ANTIVERT) 25 MG tablet, Take 25 mg by mouth 3 (three) times daily as needed for dizziness. , Disp: , Rfl:    metFORMIN (GLUCOPHAGE) 1000 MG tablet, Take 1,000 mg by mouth daily. , Disp: , Rfl:    Multiple Vitamins-Minerals (CENTRUM SILVER 50+WOMEN) TABS, Take 1 tablet by mouth daily. , Disp: , Rfl:    omeprazole (PRILOSEC) 40 MG capsule, Take 40 mg by mouth daily., Disp: , Rfl:    potassium chloride SA (KLOR-CON) 20 MEQ tablet, Take 1 tablet (20 mEq total) by mouth daily. TAKE 2 TABLETS DAILY FOR 3 DAYS, Disp: 30 tablet, Rfl: 1   vitamin B-12 (CYANOCOBALAMIN) 500 MCG tablet, Take 500 mcg by mouth daily., Disp: , Rfl:   Past Medical History: Past Medical History:  Diagnosis Date   Anxiety neurosis    Arthritis    Asthma    CAD (coronary artery disease)    a. 08/2017: abnormal nuc-> sent directly to [facility], NSTEMI, s/p CABG (left internal mammary artery  to left anterior descending, sequential saphenous vein graft to ramus intermedius branches 1 and 2).   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   Pulmonary embolus (HCC)    Type 2 diabetes mellitus (HCC)     Tobacco Use: Social History   Tobacco Use  Smoking Status Former   Packs/day: 0.50   Years: 35.00   Pack years: 17.50   Types: Cigarettes   Quit date: 03/25/1978   Years since quitting: 42.5  Smokeless Tobacco Never  Tobacco Comments   quit more than 30 years ago     Labs: Recent Review Flowsheet Data     Labs for ITP Cardiac and Pulmonary Rehab Latest Ref Rng & Units 05/12/2019 05/13/2019 05/13/2019 05/14/2019 07/21/2020   Cholestrol 0 - 200 mg/dL - - - - -   LDLCALC 0 - 99 mg/dL - - - - -   HDL >40 mg/dL - - - - -   Trlycerides <150 mg/dL - - - - -   Hemoglobin A1c 4.8 - 5.6 % 7.2(H) - - - 7.0(H)   PHART 7.350 - 7.450 - 7.285(L) - 7.376 -   PCO2ART 32.0 - 48.0 mmHg - 44.8 - 43.2 -   HCO3 20.0 - 28.0 mmol/L - 21.3 - 25.4 -   TCO2 22 - 32 mmol/L - 23 23 27  -   ACIDBASEDEF 0.0 - 2.0 mmol/L - 5.0(H) - - -   O2SAT % - 94.0 - 97.0 -       Capillary Blood Glucose: Lab Results  Component Value Date   GLUCAP 143 (H) 09/28/2020   GLUCAP 111 (H) 09/14/2020   GLUCAP 131 (H) 09/12/2020   GLUCAP 181 (H) 07/21/2020   GLUCAP 113 (H) 07/21/2020    POCT Glucose     Row Name 09/12/20 1410             POCT Blood Glucose   Pre-Exercise 131 mg/dL                Pulmonary Assessment Scores:  Pulmonary Assessment Scores     Row Name 09/04/20 1238         ADL UCSD   ADL Phase Entry     SOB Score total 99           CAT Score     CAT Score 27           mMRC Score     mMRC Score 4            UCSD: Self-administered rating of dyspnea associated with activities of daily living (ADLs) 6-point scale (0 = "not at all" to 5 = "maximal or unable to do because of breathlessness")  Scoring Scores range from 0 to 120.  Minimally important difference is 5 units  CAT: CAT can identify the health impairment of COPD patients and is better correlated with disease progression.  CAT has a scoring range of zero to 40. The CAT score is classified into four groups of low (less than 10), medium (10 - 20), high (21-30) and very high (31-40) based on the impact level of disease on health status. A CAT score over 10  suggests significant symptoms.  A worsening CAT score could be explained by an exacerbation, poor medication adherence, poor inhaler  technique, or progression of COPD or comorbid conditions.  CAT MCID is 2 points  mMRC: mMRC (Modified Medical Research Council) Dyspnea Scale is used to assess the degree of baseline functional disability in patients of respiratory disease due to dyspnea. No minimal important difference is established. A decrease in score of 1 point or greater is considered a positive change.   Pulmonary Function Assessment:   Exercise Target Goals: Exercise Program Goal: Individual exercise prescription set using results from initial 6 min walk test and THRR while considering  patient's activity barriers and safety.   Exercise Prescription Goal: Initial exercise prescription builds to 30-45 minutes a day of aerobic activity, 2-3 days per week.  Home exercise guidelines will be given to patient during program as part of exercise prescription that the participant will acknowledge.  Activity Barriers & Risk Stratification:  Activity Barriers & Cardiac Risk Stratification - 09/04/20 1246       Activity Barriers & Cardiac Risk Stratification   Activity Barriers Arthritis;Back Problems;Left Knee Replacement;Neck/Spine Problems;Joint Problems;Deconditioning;Muscular Weakness;Shortness of Breath;Balance Concerns    Cardiac Risk Stratification High             6 Minute Walk:  6 Minute Walk     Row Name 09/12/20 1309         6 Minute Walk   Phase Initial     Distance 250 feet     Walk Time 6 minutes     # of Rest Breaks 1     MPH 0.47     METS 0.51     RPE 9     Perceived Dyspnea  13     VO2 Peak 1.78     Symptoms Yes (comment)     Comments seated break for 1 minute due to left knee pain 7/10     Resting HR 64 bpm     Resting BP 120/60     Resting Oxygen Saturation  100 %     Exercise Oxygen Saturation  during 6 min walk 99 %     Max Ex. HR 95 bpm     Max Ex. BP 134/50     2 Minute Post BP 130/52           Interval HR     1 Minute HR 83     2 Minute HR 85     3 Minute HR 95     4  Minute HR 94     5 Minute HR 80     6 Minute HR 85     2 Minute Post HR 66     Interval Heart Rate? Yes           Interval Oxygen     Interval Oxygen? Yes     Baseline Oxygen Saturation % 100 %     1 Minute Oxygen Saturation % 100 %     1 Minute Liters of Oxygen 2 L     2 Minute Oxygen Saturation % 100 %     2 Minute Liters of Oxygen 2 L     3 Minute Oxygen Saturation % 100 %     3 Minute Liters of Oxygen 2 L     4 Minute Oxygen Saturation % 99 %     4 Minute Liters of Oxygen 2 L     5 Minute Oxygen Saturation % 100 %  5 Minute Liters of Oxygen 2 L     6 Minute Oxygen Saturation % 99 %     6 Minute Liters of Oxygen 2 L     2 Minute Post Oxygen Saturation % 100 %     2 Minute Post Liters of Oxygen 2 L             Oxygen Initial Assessment:  Oxygen Initial Assessment - 09/12/20 1309       Initial 6 min Walk   Oxygen Used Continuous    Liters per minute 2      Program Oxygen Prescription   Program Oxygen Prescription Continuous    Liters per minute 2      Intervention   Short Term Goals To learn and exhibit compliance with exercise, home and travel O2 prescription;To learn and understand importance of monitoring SPO2 with pulse oximeter and demonstrate accurate use of the pulse oximeter.;To learn and understand importance of maintaining oxygen saturations>88%;To learn and demonstrate proper pursed lip breathing techniques or other breathing techniques. ;To learn and demonstrate proper use of respiratory medications    Long  Term Goals Exhibits compliance with exercise, home  and travel O2 prescription;Verbalizes importance of monitoring SPO2 with pulse oximeter and return demonstration;Maintenance of O2 saturations>88%;Exhibits proper breathing techniques, such as pursed lip breathing or other method taught during program session;Compliance with respiratory medication             Oxygen Re-Evaluation:  Oxygen Re-Evaluation     Row Name 10/10/20 1121              Program Oxygen Prescription   Program Oxygen Prescription Continuous       Liters per minute 2               Home Oxygen     Home Oxygen Device Home Concentrator;E-Tanks       Sleep Oxygen Prescription None       Home Exercise Oxygen Prescription Continuous       Liters per minute 2       Home Resting Oxygen Prescription None       Compliance with Home Oxygen Use Yes               Goals/Expected Outcomes     Short Term Goals To learn and exhibit compliance with exercise, home and travel O2 prescription;To learn and understand importance of monitoring SPO2 with pulse oximeter and demonstrate accurate use of the pulse oximeter.;To learn and understand importance of maintaining oxygen saturations>88%;To learn and demonstrate proper pursed lip breathing techniques or other breathing techniques. ;To learn and demonstrate proper use of respiratory medications       Long  Term Goals Exhibits compliance with exercise, home  and travel O2 prescription;Verbalizes importance of monitoring SPO2 with pulse oximeter and return demonstration;Maintenance of O2 saturations>88%;Exhibits proper breathing techniques, such as pursed lip breathing or other method taught during program session;Compliance with respiratory medication       Goals/Expected Outcomes compliance               Oxygen Discharge (Final Oxygen Re-Evaluation):  Oxygen Re-Evaluation - 10/10/20 1121       Program Oxygen Prescription   Program Oxygen Prescription Continuous    Liters per minute 2      Home Oxygen   Home Oxygen Device Home Concentrator;E-Tanks    Sleep Oxygen Prescription None    Home Exercise Oxygen Prescription Continuous    Liters per minute 2  Home Resting Oxygen Prescription None    Compliance with Home Oxygen Use Yes      Goals/Expected Outcomes   Short Term Goals To learn and exhibit compliance with exercise, home and travel O2 prescription;To learn and understand importance of monitoring SPO2 with  pulse oximeter and demonstrate accurate use of the pulse oximeter.;To learn and understand importance of maintaining oxygen saturations>88%;To learn and demonstrate proper pursed lip breathing techniques or other breathing techniques. ;To learn and demonstrate proper use of respiratory medications    Long  Term Goals Exhibits compliance with exercise, home  and travel O2 prescription;Verbalizes importance of monitoring SPO2 with pulse oximeter and return demonstration;Maintenance of O2 saturations>88%;Exhibits proper breathing techniques, such as pursed lip breathing or other method taught during program session;Compliance with respiratory medication    Goals/Expected Outcomes compliance             Initial Exercise Prescription:   Perform Capillary Blood Glucose checks as needed.  Exercise Prescription Changes:   Exercise Prescription Changes     Row Name 09/26/20 1200 10/03/20 1121           Response to Exercise   Blood Pressure (Admit) 108/56 128/58      Blood Pressure (Exercise) 132/58 120/60      Blood Pressure (Exit) 124/58 120/58      Heart Rate (Admit) 65 bpm 63 bpm      Heart Rate (Exercise) 68 bpm 63 bpm      Heart Rate (Exit) 65 bpm 58 bpm      Oxygen Saturation (Admit) 92 % 100 %      Oxygen Saturation (Exercise) 100 % 98 %      Oxygen Saturation (Exit) 100 % 99 %      Rating of Perceived Exertion (Exercise) 11 9      Perceived Dyspnea (Exercise) 11 9      Duration Continue with 30 min of aerobic exercise without signs/symptoms of physical distress. Continue with 30 min of aerobic exercise without signs/symptoms of physical distress.      Intensity THRR unchanged THRR unchanged             Progression      Progression Continue to progress workloads to maintain intensity without signs/symptoms of physical distress. Continue to progress workloads to maintain intensity without signs/symptoms of physical distress.             Resistance Training      Weight 2 lbs  2 lbs      Reps 10-15 10-15      Time 10 Minutes 10 Minutes             Oxygen      Oxygen Continuous Continuous      Liters 2 2             NuStep      Level 1 1      SPM 58 60      Minutes 39 39      METs 1.8 1.8              Exercise Comments:   Exercise Goals and Review:   Exercise Goals     Row Name 09/12/20 1408 10/10/20 1122           Exercise Goals   Increase Physical Activity Yes Yes      Intervention Provide advice, education, support and counseling about physical activity/exercise needs.;Develop an individualized exercise prescription for aerobic and resistive training based on initial evaluation  findings, risk stratification, comorbidities and participant's personal goals. Provide advice, education, support and counseling about physical activity/exercise needs.;Develop an individualized exercise prescription for aerobic and resistive training based on initial evaluation findings, risk stratification, comorbidities and participant's personal goals.      Expected Outcomes Short Term: Attend rehab on a regular basis to increase amount of physical activity.;Long Term: Add in home exercise to make exercise part of routine and to increase amount of physical activity.;Long Term: Exercising regularly at least 3-5 days a week. Short Term: Attend rehab on a regular basis to increase amount of physical activity.;Long Term: Add in home exercise to make exercise part of routine and to increase amount of physical activity.;Long Term: Exercising regularly at least 3-5 days a week.      Increase Strength and Stamina Yes Yes      Intervention Provide advice, education, support and counseling about physical activity/exercise needs.;Develop an individualized exercise prescription for aerobic and resistive training based on initial evaluation findings, risk stratification, comorbidities and participant's personal goals. Provide advice, education, support and counseling about physical  activity/exercise needs.;Develop an individualized exercise prescription for aerobic and resistive training based on initial evaluation findings, risk stratification, comorbidities and participant's personal goals.      Expected Outcomes Short Term: Increase workloads from initial exercise prescription for resistance, speed, and METs.;Short Term: Perform resistance training exercises routinely during rehab and add in resistance training at home;Long Term: Improve cardiorespiratory fitness, muscular endurance and strength as measured by increased METs and functional capacity (6MWT) Short Term: Increase workloads from initial exercise prescription for resistance, speed, and METs.;Short Term: Perform resistance training exercises routinely during rehab and add in resistance training at home;Long Term: Improve cardiorespiratory fitness, muscular endurance and strength as measured by increased METs and functional capacity (6MWT)      Able to understand and use rate of perceived exertion (RPE) scale Yes Yes      Intervention Provide education and explanation on how to use RPE scale Provide education and explanation on how to use RPE scale      Expected Outcomes Short Term: Able to use RPE daily in rehab to express subjective intensity level;Long Term:  Able to use RPE to guide intensity level when exercising independently Short Term: Able to use RPE daily in rehab to express subjective intensity level;Long Term:  Able to use RPE to guide intensity level when exercising independently      Able to understand and use Dyspnea scale Yes Yes      Intervention Provide education and explanation on how to use Dyspnea scale Provide education and explanation on how to use Dyspnea scale      Expected Outcomes Short Term: Able to use Dyspnea scale daily in rehab to express subjective sense of shortness of breath during exertion;Long Term: Able to use Dyspnea scale to guide intensity level when exercising independently Short  Term: Able to use Dyspnea scale daily in rehab to express subjective sense of shortness of breath during exertion;Long Term: Able to use Dyspnea scale to guide intensity level when exercising independently      Knowledge and understanding of Target Heart Rate Range (THRR) Yes Yes      Intervention Provide education and explanation of THRR including how the numbers were predicted and where they are located for reference Provide education and explanation of THRR including how the numbers were predicted and where they are located for reference      Expected Outcomes Short Term: Able to state/look up THRR;Long Term: Able  to use THRR to govern intensity when exercising independently;Short Term: Able to use daily as guideline for intensity in rehab Short Term: Able to state/look up THRR;Long Term: Able to use THRR to govern intensity when exercising independently;Short Term: Able to use daily as guideline for intensity in rehab      Understanding of Exercise Prescription Yes Yes      Intervention Provide education, explanation, and written materials on patient's individual exercise prescription Provide education, explanation, and written materials on patient's individual exercise prescription      Expected Outcomes Short Term: Able to explain program exercise prescription;Long Term: Able to explain home exercise prescription to exercise independently Short Term: Able to explain program exercise prescription;Long Term: Able to explain home exercise prescription to exercise independently               Exercise Goals Re-Evaluation :  Exercise Goals Re-Evaluation     Row Name 10/10/20 1123             Exercise Goal Re-Evaluation   Exercise Goals Review Increase Physical Activity;Increase Strength and Stamina;Able to understand and use rate of perceived exertion (RPE) scale;Able to understand and use Dyspnea scale;Knowledge and understanding of Target Heart Rate Range (THRR);Understanding of Exercise  Prescription       Comments Pt has attended 6 exercise sessions. She is limited by her knee, but has seen some improvement since she began using voltaren gel. She is motivated to work through the pain and is slowly progressing. She is currently exercising at 1.8 METs on the stepper. Will continue to monitor and progress as able.       Expected Outcomes Through exercise at home and at rehab, the patient will reach their stated goals.                Discharge Exercise Prescription (Final Exercise Prescription Changes):  Exercise Prescription Changes - 10/03/20 1121       Response to Exercise   Blood Pressure (Admit) 128/58    Blood Pressure (Exercise) 120/60    Blood Pressure (Exit) 120/58    Heart Rate (Admit) 63 bpm    Heart Rate (Exercise) 63 bpm    Heart Rate (Exit) 58 bpm    Oxygen Saturation (Admit) 100 %    Oxygen Saturation (Exercise) 98 %    Oxygen Saturation (Exit) 99 %    Rating of Perceived Exertion (Exercise) 9    Perceived Dyspnea (Exercise) 9    Duration Continue with 30 min of aerobic exercise without signs/symptoms of physical distress.    Intensity THRR unchanged      Progression   Progression Continue to progress workloads to maintain intensity without signs/symptoms of physical distress.      Resistance Training   Weight 2 lbs    Reps 10-15    Time 10 Minutes      Oxygen   Oxygen Continuous    Liters 2      NuStep   Level 1    SPM 60    Minutes 39    METs 1.8             Nutrition:  Target Goals: Understanding of nutrition guidelines, daily intake of sodium 1500mg , cholesterol 200mg , calories 30% from fat and 7% or less from saturated fats, daily to have 5 or more servings of fruits and vegetables.  Biometrics:  Pre Biometrics - 09/12/20 1409       Pre Biometrics   Height 5\' 4"  (1.626 m)  Weight 80.2 kg    Waist Circumference 45 inches    Hip Circumference 46.5 inches    Waist to Hip Ratio 0.97 %    BMI (Calculated) 30.33     Triceps Skinfold 29 mm    % Body Fat 44.8 %    Grip Strength 22 kg    Flexibility 0 in    Single Leg Stand 0 seconds              Nutrition Therapy Plan and Nutrition Goals:  Nutrition Therapy & Goals - 09/06/20 0734       Personal Nutrition Goals   Comments Patient scored 68 on her diet assessment. We offer 2 educational sessions on heart heathy nutrition with handouts and assistance with RD referral if interested.      Intervention Plan   Intervention Nutrition handout(s) given to patient.             Nutrition Assessments:  Nutrition Assessments - 09/04/20 1241       MEDFICTS Scores   Pre Score 68            MEDIFICTS Score Key: ?70 Need to make dietary changes  40-70 Heart Healthy Diet ? 40 Therapeutic Level Cholesterol Diet   Picture Your Plate Scores: <41 Unhealthy dietary pattern with much room for improvement. 41-50 Dietary pattern unlikely to meet recommendations for good health and room for improvement. 51-60 More healthful dietary pattern, with some room for improvement.  >60 Healthy dietary pattern, although there may be some specific behaviors that could be improved.    Nutrition Goals Re-Evaluation:   Nutrition Goals Discharge (Final Nutrition Goals Re-Evaluation):   Psychosocial: Target Goals: Acknowledge presence or absence of significant depression and/or stress, maximize coping skills, provide positive support system. Participant is able to verbalize types and ability to use techniques and skills needed for reducing stress and depression.  Initial Review & Psychosocial Screening:  Initial Psych Review & Screening - 09/04/20 1239       Initial Review   Current issues with None Identified      Family Dynamics   Good Support System? Yes    Comments Her daughter supports her. Her husband supports her as well by taking her to all of her appointments.      Barriers   Psychosocial barriers to participate in program There are no  identifiable barriers or psychosocial needs.      Screening Interventions   Interventions Encouraged to exercise    Expected Outcomes Long Term goal: The participant improves quality of Life and PHQ9 Scores as seen by post scores and/or verbalization of changes;Short Term goal: Identification and review with participant of any Quality of Life or Depression concerns found by scoring the questionnaire.;Long Term Goal: Stressors or current issues are controlled or eliminated.             Quality of Life Scores:  Quality of Life - 09/04/20 1311       Quality of Life   Select Quality of Life      Quality of Life Scores   Health/Function Pre 8.97 %    Socioeconomic Pre 22.7 %    Psych/Spiritual Pre 20.29 %    Family Pre 8.5 %    GLOBAL Pre 13.38 %            Scores of 19 and below usually indicate a poorer quality of life in these areas.  A difference of  2-3 points is a clinically meaningful difference.  A  difference of 2-3 points in the total score of the Quality of Life Index has been associated with significant improvement in overall quality of life, self-image, physical symptoms, and general health in studies assessing change in quality of life.   PHQ-9: Recent Review Flowsheet Data     Depression screen [facility] 2/9 09/04/2020 06/29/2019   Decreased Interest 3  0   Down, Depressed, Hopeless 1 0   PHQ - 2 Score 4 0   Altered sleeping 1  -   Tired, decreased energy 3 -   Change in appetite 3 -   Feeling bad or failure about yourself  1 -   Trouble concentrating 1 -   Moving slowly or fidgety/restless 0 -   Suicidal thoughts 0 -   PHQ-9 Score 13 -   Difficult doing work/chores Very difficult -      Interpretation of Total Score  Total Score Depression Severity:  1-4 = Minimal depression, 5-9 = Mild depression, 10-14 = Moderate depression, 15-19 = Moderately severe depression, 20-27 = Severe depression   Psychosocial Evaluation and Intervention:  Psychosocial Evaluation  - 09/04/20 1311       Psychosocial Evaluation & Interventions   Interventions Encouraged to exercise with the program and follow exercise prescription    Comments Pt has no identifiable barriers to completing pulmonary rehab. She states that she has no identifiable psychosoical barriers, but she did score a 13 on her PHQ-9. A year and a half ago she had a lymph node removed from her lung, and she states that she has had constant fatigue, weakness, and SOB since. She is unable to do her former hobbies, cleaning, or cooking now. She is bothered by this and by the amount of pain that she experiences. She states that she does not need professional mental health help and that she copes well on her own. She states that her support system is primarily her daughter and her husband. Her goals while in the program are to get stronger and to decrease her SOB.    Expected Outcomes The patient will decrease her PHQ-9 values at discharge.    Continue Psychosocial Services  No Follow up required             Psychosocial Re-Evaluation:  Psychosocial Re-Evaluation     [place] Name 10/04/20 1258             Psychosocial Re-Evaluation   Current issues with Current Anxiety/Panic       Comments Patient is new to the program completing 6 sessions. She continues to have no psychosocial barriers identified. 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 She seems to enjoy the sessions. Will continue to monitor.       Expected Outcomes Patient will continue to have no psychosocial barriers identified.       Interventions Stress management education;Relaxation education;Encouraged to attend Pulmonary Rehabilitation for the exercise       Continue Psychosocial Services  No Follow up required                Psychosocial Discharge (Final Psychosocial Re-Evaluation):  Psychosocial Re-Evaluation - 10/04/20 1258       Psychosocial Re-Evaluation   Current issues with Current  Anxiety/Panic    Comments Patient is new to the program completing 6 sessions. She continues to have no psychosocial barriers identified. 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 She seems to enjoy the sessions. Will  continue to monitor.    Expected Outcomes Patient will continue to have no psychosocial barriers identified.    Interventions Stress management education;Relaxation education;Encouraged to attend Pulmonary Rehabilitation for the exercise    Continue Psychosocial Services  No Follow up required              Education: Education Goals: Education classes will be provided on a weekly basis, covering required topics. Participant will state understanding/return demonstration of topics presented.  Learning Barriers/Preferences:  Learning Barriers/Preferences - 09/04/20 1242       Learning Barriers/Preferences   Learning Barriers None    Learning Preferences Skilled Demonstration             Education Topics: How Lungs Work and Diseases: - Discuss the anatomy of the lungs and diseases that can affect the lungs, such as COPD. Flowsheet Row PULMONARY REHAB OTHER RESPIRATORY from 09/28/2020 in [place]  Date 09/28/20  Educator DJ  Instruction Review Code 1- Verbalizes Understanding       Exercise: -Discuss the importance of exercise, FITT principles of exercise, normal and abnormal responses to exercise, and how to exercise safely.   Environmental Irritants: -Discuss types of environmental irritants and how to limit exposure to environmental irritants.   Meds/Inhalers and oxygen: - Discuss respiratory medications, definition of an inhaler and oxygen, and the proper way to use an inhaler and oxygen.   Energy Saving Techniques: - Discuss methods to conserve energy and decrease shortness of breath when performing activities of daily living.    Bronchial Hygiene / Breathing Techniques: - Discuss  breathing mechanics, pursed-lip breathing technique,  proper posture, effective ways to clear airways, and other functional breathing techniques   Cleaning Equipment: - Provides group verbal and written instruction about the health risks of elevated stress, cause of high stress, and healthy ways to reduce stress.   Nutrition I: Fats: - Discuss the types of cholesterol, what cholesterol does to the body, and how cholesterol levels can be controlled.   Nutrition II: Labels: -Discuss the different components of food labels and how to read food labels.   Respiratory Infections: - Discuss the signs and symptoms of respiratory infections, ways to prevent respiratory infections, and the importance of seeking medical treatment when having a respiratory infection.   Stress I: Signs and Symptoms: - Discuss the causes of stress, how stress may lead to anxiety and depression, and ways to limit stress.   Stress II: Relaxation: -Discuss relaxation techniques to limit stress.   Oxygen for Home/Travel: - Discuss how to prepare for travel when on oxygen and proper ways to transport and store oxygen to ensure safety. Flowsheet Row PULMONARY REHAB OTHER RESPIRATORY from 09/28/2020 in [place]  Date 09/14/20  Educator DJ  Instruction Review Code 1- Verbalizes Understanding       Knowledge Questionnaire Score:  Knowledge Questionnaire Score - 09/04/20 1243       Knowledge Questionnaire Score   Pre Score 16/18             Core Components/Risk Factors/Patient Goals at Admission:  Personal Goals and Risk Factors at Admission - 09/04/20 1245       Core Components/Risk Factors/Patient Goals on Admission    Weight Management Yes;Weight Maintenance    Intervention Weight Management: Develop a combined nutrition and exercise program designed to reach desired caloric intake, while maintaining appropriate intake of nutrient and fiber, sodium and fats, and appropriate  energy expenditure required for the weight goal.;Weight Management: Provide education  and appropriate resources to help participant work on and attain dietary goals.    Expected Outcomes Short Term: Continue to assess and modify interventions until short term weight is achieved;Long Term: Adherence to nutrition and physical activity/exercise program aimed toward attainment of established weight goal;Weight Maintenance: Understanding of the daily nutrition guidelines, which includes 25-35% calories from fat, 7% or less cal from saturated fats, less than 200mg  cholesterol, less than 1.5gm of sodium, & 5 or more servings of fruits and vegetables daily    Improve shortness of breath with ADL's Yes    Intervention Provide education, individualized exercise plan and daily activity instruction to help decrease symptoms of SOB with activities of daily living.    Expected Outcomes Short Term: Improve cardiorespiratory fitness to achieve a reduction of symptoms when performing ADLs;Long Term: Be able to perform more ADLs without symptoms or delay the onset of symptoms    Diabetes Yes    Intervention Provide education about signs/symptoms and action to take for hypo/hyperglycemia.;Provide education about proper nutrition, including hydration, and aerobic/resistive exercise prescription along with prescribed medications to achieve blood glucose in normal ranges: Fasting glucose 65-99 mg/dL    Expected Outcomes Long Term: Attainment of HbA1C < 7%.;Short Term: Participant verbalizes understanding of the signs/symptoms and immediate care of hyper/hypoglycemia, proper foot care and importance of medication, aerobic/resistive exercise and nutrition plan for blood glucose control.    Heart Failure Yes    Intervention Provide a combined exercise and nutrition program that is supplemented with education, support and counseling about heart failure. Directed toward relieving symptoms such as shortness of breath, decreased  exercise tolerance, and extremity edema.    Expected Outcomes Improve functional capacity of life;Short term: Attendance in program 2-3 days a week with increased exercise capacity. Reported lower sodium intake. Reported increased fruit and vegetable intake. Reports medication compliance.;Short term: Daily weights obtained and reported for increase. Utilizing diuretic protocols set by physician.;Long term: Adoption of self-care skills and reduction of barriers for early signs and symptoms recognition and intervention leading to self-care maintenance.    Hypertension Yes    Intervention Provide education on lifestyle modifcations including regular physical activity/exercise, weight management, moderate sodium restriction and increased consumption of fresh fruit, vegetables, and low fat dairy, alcohol moderation, and smoking cessation.;Monitor prescription use compliance.    Expected Outcomes Short Term: Continued assessment and intervention until BP is < 140/32mm HG in hypertensive participants. < 130/10mm HG in hypertensive participants with diabetes, heart failure or chronic kidney disease.;Long Term: Maintenance of blood pressure at goal levels.    Lipids Yes    Intervention Provide education and support for participant on nutrition & aerobic/resistive exercise along with prescribed medications to achieve LDL 70mg , HDL >40mg .    Expected Outcomes Short Term: Participant states understanding of desired cholesterol values and is compliant with medications prescribed. Participant is following exercise prescription and nutrition guidelines.;Long Term: Cholesterol controlled with medications as prescribed, with individualized exercise RX and with personalized nutrition plan. Value goals: LDL < 70mg , HDL > 40 mg.             Core Components/Risk Factors/Patient Goals Review:   Goals and Risk Factor Review     Row Name 10/04/20 1302             Core Components/Risk Factors/Patient Goals Review    Personal Goals Review Improve shortness of breath with ADL's;Other;Heart Failure       Review Patient referred to PR with CHF. She is new to the program completing  6 sessions maintaining her weigth since her initial visit. Her personal goals for the program are to get stronger and decrease her SOB. Her progress is limited by her chronic knee pain. We will continue to monitor her progress as she works towards  meeting these goals.       Expected Outcomes Patient will complete the program meeting both personal and program goals.                Core Components/Risk Factors/Patient Goals at Discharge (Final Review):   Goals and Risk Factor Review - 10/04/20 1302       Core Components/Risk Factors/Patient Goals Review   Personal Goals Review Improve shortness of breath with ADL's;Other;Heart Failure    Review Patient referred to PR with CHF. She is new to the program completing 6 sessions maintaining her weigth since her initial visit. Her personal goals for the program are to get stronger and decrease her SOB. Her progress is limited by her chronic knee pain. We will continue to monitor her progress as she works towards  meeting these goals.    Expected Outcomes Patient will complete the program meeting both personal and program goals.             ITP Comments:   Comments: ITP REVIEW Pt is making expected progress toward pulmonary rehab goals after completing 8 sessions. Recommend continued exercise, life style modification, education, and utilization of breathing techniques to increase stamina and strength and decrease shortness of breath with exertion.

## 2020-10-12 ENCOUNTER — Encounter (HOSPITAL_COMMUNITY): Payer: Medicare HMO

## 2020-10-13 DIAGNOSIS — U071 COVID-19: Secondary | ICD-10-CM | POA: Diagnosis not present

## 2020-10-17 ENCOUNTER — Encounter (HOSPITAL_COMMUNITY): Payer: Medicare HMO

## 2020-10-18 DIAGNOSIS — I10 Essential (primary) hypertension: Secondary | ICD-10-CM | POA: Diagnosis not present

## 2020-10-18 DIAGNOSIS — J441 Chronic obstructive pulmonary disease with (acute) exacerbation: Secondary | ICD-10-CM | POA: Diagnosis not present

## 2020-10-18 DIAGNOSIS — Z0000 Encounter for general adult medical examination without abnormal findings: Secondary | ICD-10-CM | POA: Diagnosis not present

## 2020-10-18 DIAGNOSIS — Z6830 Body mass index (BMI) 30.0-30.9, adult: Secondary | ICD-10-CM | POA: Diagnosis not present

## 2020-10-18 DIAGNOSIS — E7849 Other hyperlipidemia: Secondary | ICD-10-CM | POA: Diagnosis not present

## 2020-10-18 DIAGNOSIS — J1529 Pneumonia due to other staphylococcus: Secondary | ICD-10-CM | POA: Diagnosis not present

## 2020-10-18 DIAGNOSIS — I5022 Chronic systolic (congestive) heart failure: Secondary | ICD-10-CM | POA: Diagnosis not present

## 2020-10-18 DIAGNOSIS — I700 Atherosclerosis of aorta: Secondary | ICD-10-CM | POA: Diagnosis not present

## 2020-10-19 ENCOUNTER — Encounter (HOSPITAL_COMMUNITY): Payer: Medicare HMO

## 2020-10-22 DIAGNOSIS — I10 Essential (primary) hypertension: Secondary | ICD-10-CM | POA: Diagnosis not present

## 2020-10-22 DIAGNOSIS — I5022 Chronic systolic (congestive) heart failure: Secondary | ICD-10-CM | POA: Diagnosis not present

## 2020-10-22 DIAGNOSIS — J441 Chronic obstructive pulmonary disease with (acute) exacerbation: Secondary | ICD-10-CM | POA: Diagnosis not present

## 2020-10-22 DIAGNOSIS — E7849 Other hyperlipidemia: Secondary | ICD-10-CM | POA: Diagnosis not present

## 2020-10-22 DIAGNOSIS — I700 Atherosclerosis of aorta: Secondary | ICD-10-CM | POA: Diagnosis not present

## 2020-10-23 DIAGNOSIS — M25562 Pain in left knee: Secondary | ICD-10-CM | POA: Diagnosis not present

## 2020-10-23 DIAGNOSIS — M47816 Spondylosis without myelopathy or radiculopathy, lumbar region: Secondary | ICD-10-CM | POA: Diagnosis not present

## 2020-10-23 DIAGNOSIS — M25561 Pain in right knee: Secondary | ICD-10-CM | POA: Diagnosis not present

## 2020-10-23 DIAGNOSIS — M1612 Unilateral primary osteoarthritis, left hip: Secondary | ICD-10-CM | POA: Diagnosis not present

## 2020-10-24 ENCOUNTER — Other Ambulatory Visit: Payer: Self-pay | Admitting: Sports Medicine

## 2020-10-24 ENCOUNTER — Encounter (HOSPITAL_COMMUNITY): Payer: Medicare HMO

## 2020-10-24 DIAGNOSIS — M25552 Pain in left hip: Secondary | ICD-10-CM

## 2020-10-25 ENCOUNTER — Encounter: Payer: Self-pay | Admitting: Pulmonary Disease

## 2020-10-25 ENCOUNTER — Ambulatory Visit: Payer: Medicare HMO | Admitting: Pulmonary Disease

## 2020-10-25 ENCOUNTER — Other Ambulatory Visit: Payer: Self-pay

## 2020-10-25 VITALS — BP 122/70 | HR 65 | Temp 97.6°F | Ht 65.0 in | Wt 175.0 lb

## 2020-10-25 DIAGNOSIS — R0600 Dyspnea, unspecified: Secondary | ICD-10-CM

## 2020-10-25 DIAGNOSIS — R0609 Other forms of dyspnea: Secondary | ICD-10-CM

## 2020-10-25 NOTE — Patient Instructions (Signed)
Will reschedule your breathing test  Follow up in 2 months

## 2020-10-25 NOTE — Progress Notes (Signed)
[facility]  Chief Complaint  Patient presents with   Follow-up    Patient has not had PFT done, Had ONO and CXR done in May. Patient has some shortness of breath with exertion and states that she feels like it is the same since last visit.      Constitutional:  BP 122/70 (BP Location: Left Arm, Patient Position: Sitting, Cuff Size: Normal)   Pulse 65   Temp 97.6 F (36.4 C) (Oral)   Ht 5\' 5"  (1.651 m)   Wt 175 lb (79.4 kg)   SpO2 99%   BMI 29.12 kg/m   Past Medical History:  OA, CAD, Anemia, GERD, Glaucoma, Hiatal hernia, HLD, HTN, ischemic CM, CAD s/p CABG, Post op a fib, PE, DM type 2, NSCLC s/p RULectomy  Past Surgical History:  She  has a past surgical history that includes Hernia repair; Total knee arthroplasty; Shoulder surgery; LEFT HEART CATH AND CORONARY ANGIOGRAPHY (N/A, 09/15/2017); Coronary artery bypass graft (N/A, 09/18/2017); TEE without cardioversion (N/A, 09/18/2017); Back surgery (2016); Eye surgery (Bilateral); Vaginal hysterectomy; Rotator cuff repair (Left); Lymph node dissection (Right, 05/13/2019); and Intercostal nerve block (Right, 05/13/2019).  Brief Summary:  Karen Tate is a 75 y.o. female former smoker with with dyspnea.      Subjective:   She is here with her husband.  Didn't get PFT scheduled.    Uses oxygen intermittently with exertion when she feels short of breath.  Got iron infusion and breathing better.  Has hip and knee pain.  Getting injection in her hip.  Not having cough, wheeze, or sputum.   Physical Exam:   Appearance - well kempt, in wheelchair  ENMT - no sinus tenderness, no oral exudate, no LAN, Mallampati 2 airway, no stridor  Respiratory - equal breath sounds bilaterally, no wheezing or rales  CV - s1s2 regular rate and rhythm, 2/6 SM  Ext - no clubbing, no edema  Skin - no rashes  Psych - normal mood and affect    Pulmonary testing:  PFT 05/12/19 >> FEV1 1.02  (59%), FEV1% 79, TLC 3.07 (60%), FEV1% 63%  Chest Imaging:  CT angio chest 07/21/20 >> CHF pattern  Sleep Tests:  ONO with RA 09/08/20 >> test time 6 hrs 2 min.  Basal SpO2 99%, low SpO2 96%.  Cardiac Tests:  Echo 07/21/20 >> EF 60 to 65%, grade 2 DD, RVSP 61.3 mmHg, mod MR, mod TR  Social History:  She  reports that she quit smoking about 42 years ago. Her smoking use included cigarettes. She has a 17.50 pack-year smoking history. She has never used smokeless tobacco. She reports that she does not drink alcohol and does not use drugs.  Family History:  Her family history includes Anemia in her daughter; Bladder Cancer in her son; Breast cancer in her mother; Congestive Heart Failure in her brother, son, and son; Diabetes in her daughter and father; Heart disease in her father; Hypertension in her daughter; Migraines in her daughter; Seizures in her daughter.     Assessment/Plan:   Dyspnea on exertion. - most likely related to cardiac disease, anemia and deconditioning - will try to reschedule PFT - CXR shows expected volume loss after lobectomy with elevation in Rt diaphragm - prn albuterol  Chronic respiratory failure with hypoxia. 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 - followed by Dr. Delton Coombes with [facility]  Coronary artery  disease, Valvular heart disease, Chronic diastolic CHF. - followed by Dr. Carlyle Dolly with [facility]  Time Spent Involved in Patient Care on Day of Examination:  26 minutes  Follow up:   Patient Instructions  Will reschedule your breathing test  Follow up in 2 months  Medication List:   Allergies as of 10/25/2020       Reactions   Beta Adrenergic Blockers Other (See Comments)   Dropped heart rate to the 40's   Calcium Channel Blockers Other (See Comments)   Dropped HR into the 40's   Naproxen Hives        Medication List        Accurate as of  October 25, 2020 11:56 AM. If you have any questions, ask your nurse or doctor.          acetaminophen 500 MG tablet Commonly known as: TYLENOL Take 1,000 mg by mouth every 6 (six) hours as needed for moderate pain.   albuterol 108 (90 Base) MCG/ACT inhaler Commonly known as: VENTOLIN HFA Inhale 2 puffs into the lungs every 6 (six) hours as needed.   albuterol 0.63 MG/3ML nebulizer solution Commonly known as: ACCUNEB Take 3 mLs by nebulization 4 (four) times daily.   ALPRAZolam 0.5 MG tablet Commonly known as: XANAX Take 0.5 mg by mouth 2 (two) times daily.   amLODipine 10 MG tablet Commonly known as: NORVASC Take 10 mg by mouth daily.   aspirin EC 81 MG tablet Take 1 tablet (81 mg total) by mouth daily.   atorvastatin 80 MG tablet Commonly known as: LIPITOR Take 1 tablet (80 mg total) by mouth daily at 6 PM.   carvedilol 25 MG tablet Commonly known as: COREG Take 1 tablet by mouth 2 (two) times daily.   Centrum Silver 50+Women Tabs Take 1 tablet by mouth daily.   cholecalciferol 25 MCG (1000 UNIT) tablet Commonly known as: VITAMIN D3 Take 1,000 Units by mouth daily.   ferrous sulfate 325 (65 FE) MG tablet Take 325 mg by mouth 2 (two) times daily.   fish oil-omega-3 fatty acids 1000 MG capsule Take 1 g by mouth 3 (three) times daily.   folic acid 1 MG tablet Commonly known as: FOLVITE Take 1 tablet (1 mg total) by mouth daily.   furosemide 20 MG tablet Commonly known as: LASIX TAKE 20 MG EVERY OTHER DAY ALTERNATING WITH 40 MG EVERY OTHER DAY   latanoprost 0.005 % ophthalmic solution Commonly known as: XALATAN Place 1 drop into both eyes at bedtime.   lisinopril 40 MG tablet Commonly known as: ZESTRIL TAKE 1 TABLET EVERY DAY ( DOSE INCREASE )   magnesium oxide 400 MG tablet Commonly known as: MAG-OX Take 1 tablet (400 mg total) by mouth 2 (two) times daily. What changed: when to take this   meclizine 25 MG tablet Commonly known as: ANTIVERT Take  25 mg by mouth 3 (three) times daily as needed for dizziness.   metFORMIN 1000 MG tablet Commonly known as: GLUCOPHAGE Take 1,000 mg by mouth daily.   omeprazole 40 MG capsule Commonly known as: PRILOSEC Take 40 mg by mouth daily.   potassium chloride SA 20 MEQ tablet Commonly known as: KLOR-CON Take 1 tablet (20 mEq total) by mouth daily. TAKE 2 TABLETS DAILY FOR 3 DAYS   vitamin B-12 500 MCG tablet Commonly known as: CYANOCOBALAMIN Take 500 mcg by mouth daily.        Signature:  Chesley Mires, MD [place] Pager - [phone])  [place] 10/25/2020, 11:56 AM

## 2020-10-26 ENCOUNTER — Encounter (HOSPITAL_COMMUNITY): Payer: Medicare HMO

## 2020-10-29 DIAGNOSIS — R531 Weakness: Secondary | ICD-10-CM | POA: Diagnosis not present

## 2020-10-30 ENCOUNTER — Other Ambulatory Visit: Payer: Self-pay | Admitting: Sports Medicine

## 2020-10-31 ENCOUNTER — Other Ambulatory Visit: Payer: Self-pay | Admitting: Sports Medicine

## 2020-10-31 ENCOUNTER — Encounter (HOSPITAL_COMMUNITY): Payer: Medicare HMO

## 2020-10-31 DIAGNOSIS — M25552 Pain in left hip: Secondary | ICD-10-CM

## 2020-11-01 NOTE — Addendum Note (Signed)
Encounter addended by: Dwana Melena, RN on: 11/01/2020 7:46 AM  Actions taken: Flowsheet accepted, Flowsheet data copied forward

## 2020-11-02 ENCOUNTER — Encounter (HOSPITAL_COMMUNITY): Payer: Medicare HMO

## 2020-11-02 NOTE — Progress Notes (Signed)
Discharge Progress Report  Patient Details  Name: Karen Tate MRN: [id number] Date of Birth: 07-12-1945 Referring Provider:     Number of Visits: 7  Reason for Discharge:  Early Exit:  Left knee pain  Smoking History:  Social History   Tobacco Use  Smoking Status Former   Packs/day: 0.50   Years: 35.00   Pack years: 17.50   Types: Cigarettes   Quit date: 03/25/1978   Years since quitting: 42.6  Smokeless Tobacco Never  Tobacco Comments   quit more than 30 years ago    Diagnosis:  Chronic congestive heart failure, unspecified heart failure type ([place])  DOE (dyspnea on exertion)  Chronic obstructive pulmonary disease, unspecified COPD type ([place])  ADL [facility]:  Pulmonary Assessment Scores     Row Name 09/04/20 1238         ADL [facility]   ADL Phase Entry     SOB Score total 99           CAT Score   CAT Score 27           mMRC Score   mMRC Score 4              Initial Exercise Prescription:   Discharge Exercise Prescription (Final Exercise Prescription Changes):  Exercise Prescription Changes - 10/03/20 1121       Response to Exercise   Blood Pressure (Admit) 128/58    Blood Pressure (Exercise) 120/60    Blood Pressure (Exit) 120/58    Heart Rate (Admit) 63 bpm    Heart Rate (Exercise) 63 bpm    Heart Rate (Exit) 58 bpm    Oxygen Saturation (Admit) 100 %    Oxygen Saturation (Exercise) 98 %    Oxygen Saturation (Exit) 99 %    Rating of Perceived Exertion (Exercise) 9    Perceived Dyspnea (Exercise) 9    Duration Continue with 30 min of aerobic exercise without signs/symptoms of physical distress.    Intensity THRR unchanged      Progression   Progression Continue to progress workloads to maintain intensity without signs/symptoms of physical distress.      Resistance Training   Weight 2 lbs    Reps 10-15    Time 10 Minutes      Oxygen   Oxygen Continuous    Liters 2      NuStep   Level 1    SPM 60    Minutes 39    METs 1.8              Functional Capacity:  6 Minute Walk     Row Name 09/12/20 1309         6 Minute Walk   Phase Initial     Distance 250 feet     Walk Time 6 minutes     # of Rest Breaks 1     MPH 0.47     METS 0.51     RPE 9     Perceived Dyspnea  13     VO2 Peak 1.78     Symptoms Yes (comment)     Comments seated break for 1 minute due to left knee pain 7/10     Resting HR 64 bpm     Resting BP 120/60     Resting Oxygen Saturation  100 %     Exercise Oxygen Saturation  during 6 min walk 99 %     Max Ex. HR 95 bpm  Max Ex. BP 134/50     2 Minute Post BP 130/52           Interval HR   1 Minute HR 83     2 Minute HR 85     3 Minute HR 95     4 Minute HR 94     5 Minute HR 80     6 Minute HR 85     2 Minute Post HR 66     Interval Heart Rate? Yes           Interval Oxygen   Interval Oxygen? Yes     Baseline Oxygen Saturation % 100 %     1 Minute Oxygen Saturation % 100 %     1 Minute Liters of Oxygen 2 L     2 Minute Oxygen Saturation % 100 %     2 Minute Liters of Oxygen 2 L     3 Minute Oxygen Saturation % 100 %     3 Minute Liters of Oxygen 2 L     4 Minute Oxygen Saturation % 99 %     4 Minute Liters of Oxygen 2 L     5 Minute Oxygen Saturation % 100 %     5 Minute Liters of Oxygen 2 L     6 Minute Oxygen Saturation % 99 %     6 Minute Liters of Oxygen 2 L     2 Minute Post Oxygen Saturation % 100 %     2 Minute Post Liters of Oxygen 2 L              Psychological, QOL, Others - Outcomes: PHQ 2/9: Depression screen [facility] 2/9 09/04/2020 06/29/2019  Decreased Interest 3 0  Down, Depressed, Hopeless 1 0  PHQ - 2 Score 4 0  Altered sleeping 1 -  Tired, decreased energy 3 -  Change in appetite 3 -  Feeling bad or failure about yourself  1 -  Trouble concentrating 1 -  Moving slowly or fidgety/restless 0 -  Suicidal thoughts 0 -  PHQ-9 Score 13 -  Difficult doing work/chores Very difficult -    Quality of Life:  Quality of Life - 09/04/20 1311        Quality of Life   Select Quality of Life      Quality of Life Scores   Health/Function Pre 8.97 %    Socioeconomic Pre 22.7 %    Psych/Spiritual Pre 20.29 %    Family Pre 8.5 %    GLOBAL Pre 13.38 %             Personal Goals: Goals established at orientation with interventions provided to work toward goal.  Personal Goals and Risk Factors at Admission - 09/04/20 1245       Core Components/Risk Factors/Patient Goals on Admission    Weight Management Yes;Weight Maintenance    Intervention Weight Management: Develop a combined nutrition and exercise program designed to reach desired caloric intake, while maintaining appropriate intake of nutrient and fiber, sodium and fats, and appropriate energy expenditure required for the weight goal.;Weight Management: Provide education and appropriate resources to help participant work on and attain dietary goals.    Expected Outcomes Short Term: Continue to assess and modify interventions until short term weight is achieved;Long Term: Adherence to nutrition and physical activity/exercise program aimed toward attainment of established weight goal;Weight Maintenance: Understanding of the daily nutrition guidelines, which includes 25-35% calories from fat, 7%  or less cal from saturated fats, less than 200mg  cholesterol, less than 1.5gm of sodium, & 5 or more servings of fruits and vegetables daily    Improve shortness of breath with ADL's Yes    Intervention Provide education, individualized exercise plan and daily activity instruction to help decrease symptoms of SOB with activities of daily living.    Expected Outcomes Short Term: Improve cardiorespiratory fitness to achieve a reduction of symptoms when performing ADLs;Long Term: Be able to perform more ADLs without symptoms or delay the onset of symptoms    Diabetes Yes    Intervention Provide education about signs/symptoms and action to take for hypo/hyperglycemia.;Provide education about proper  nutrition, including hydration, and aerobic/resistive exercise prescription along with prescribed medications to achieve blood glucose in normal ranges: Fasting glucose 65-99 mg/dL    Expected Outcomes Long Term: Attainment of HbA1C < 7%.;Short Term: Participant verbalizes understanding of the signs/symptoms and immediate care of hyper/hypoglycemia, proper foot care and importance of medication, aerobic/resistive exercise and nutrition plan for blood glucose control.    Heart Failure Yes    Intervention Provide a combined exercise and nutrition program that is supplemented with education, support and counseling about heart failure. Directed toward relieving symptoms such as shortness of breath, decreased exercise tolerance, and extremity edema.    Expected Outcomes Improve functional capacity of life;Short term: Attendance in program 2-3 days a week with increased exercise capacity. Reported lower sodium intake. Reported increased fruit and vegetable intake. Reports medication compliance.;Short term: Daily weights obtained and reported for increase. Utilizing diuretic protocols set by physician.;Long term: Adoption of self-care skills and reduction of barriers for early signs and symptoms recognition and intervention leading to self-care maintenance.    Hypertension Yes    Intervention Provide education on lifestyle modifcations including regular physical activity/exercise, weight management, moderate sodium restriction and increased consumption of fresh fruit, vegetables, and low fat dairy, alcohol moderation, and smoking cessation.;Monitor prescription use compliance.    Expected Outcomes Short Term: Continued assessment and intervention until BP is < 140/32mm HG in hypertensive participants. < 130/41mm HG in hypertensive participants with diabetes, heart failure or chronic kidney disease.;Long Term: Maintenance of blood pressure at goal levels.    Lipids Yes    Intervention Provide education and support  for participant on nutrition & aerobic/resistive exercise along with prescribed medications to achieve LDL 70mg , HDL >40mg .    Expected Outcomes Short Term: Participant states understanding of desired cholesterol values and is compliant with medications prescribed. Participant is following exercise prescription and nutrition guidelines.;Long Term: Cholesterol controlled with medications as prescribed, with individualized exercise RX and with personalized nutrition plan. Value goals: LDL < 70mg , HDL > 40 mg.              Personal Goals Discharge:  Goals and Risk Factor Review     Row Name 10/04/20 1302 11/01/20 0744           Core Components/Risk Factors/Patient Goals Review   Personal Goals Review Improve shortness of breath with ADL's;Other;Heart Failure Improve shortness of breath with ADL's;Other;Heart Failure      Review Patient referred to PR with CHF. She is new to the program completing 6 sessions maintaining her weigth since her initial visit. Her personal goals for the program are to get stronger and decrease her SOB. Her progress is limited by her chronic knee pain. We will continue to monitor her progress as she works towards  meeting these goals. Patient has not attended during this 30 day review  period due to multiple MD appointments and chronic knee and hip pain. We will continue to monitor her ability to return to the program.      Expected Outcomes Patient will complete the program meeting both personal and program goals. Patient will complete the program meeting both personal and program goals.               Exercise Goals and Review:  Exercise Goals     Row Name 09/12/20 1408 10/10/20 1122           Exercise Goals   Increase Physical Activity Yes Yes      Intervention Provide advice, education, support and counseling about physical activity/exercise needs.;Develop an individualized exercise prescription for aerobic and resistive training based on initial  evaluation findings, risk stratification, comorbidities and participant's personal goals. Provide advice, education, support and counseling about physical activity/exercise needs.;Develop an individualized exercise prescription for aerobic and resistive training based on initial evaluation findings, risk stratification, comorbidities and participant's personal goals.      Expected Outcomes Short Term: Attend rehab on a regular basis to increase amount of physical activity.;Long Term: Add in home exercise to make exercise part of routine and to increase amount of physical activity.;Long Term: Exercising regularly at least 3-5 days a week. Short Term: Attend rehab on a regular basis to increase amount of physical activity.;Long Term: Add in home exercise to make exercise part of routine and to increase amount of physical activity.;Long Term: Exercising regularly at least 3-5 days a week.      Increase Strength and Stamina Yes Yes      Intervention Provide advice, education, support and counseling about physical activity/exercise needs.;Develop an individualized exercise prescription for aerobic and resistive training based on initial evaluation findings, risk stratification, comorbidities and participant's personal goals. Provide advice, education, support and counseling about physical activity/exercise needs.;Develop an individualized exercise prescription for aerobic and resistive training based on initial evaluation findings, risk stratification, comorbidities and participant's personal goals.      Expected Outcomes Short Term: Increase workloads from initial exercise prescription for resistance, speed, and METs.;Short Term: Perform resistance training exercises routinely during rehab and add in resistance training at home;Long Term: Improve cardiorespiratory fitness, muscular endurance and strength as measured by increased METs and functional capacity (6MWT) Short Term: Increase workloads from initial exercise  prescription for resistance, speed, and METs.;Short Term: Perform resistance training exercises routinely during rehab and add in resistance training at home;Long Term: Improve cardiorespiratory fitness, muscular endurance and strength as measured by increased METs and functional capacity (6MWT)      Able to understand and use rate of perceived exertion (RPE) scale Yes Yes      Intervention Provide education and explanation on how to use RPE scale Provide education and explanation on how to use RPE scale      Expected Outcomes Short Term: Able to use RPE daily in rehab to express subjective intensity level;Long Term:  Able to use RPE to guide intensity level when exercising independently Short Term: Able to use RPE daily in rehab to express subjective intensity level;Long Term:  Able to use RPE to guide intensity level when exercising independently      Able to understand and use Dyspnea scale Yes Yes      Intervention Provide education and explanation on how to use Dyspnea scale Provide education and explanation on how to use Dyspnea scale      Expected Outcomes Short Term: Able to use Dyspnea scale daily in rehab  to express subjective sense of shortness of breath during exertion;Long Term: Able to use Dyspnea scale to guide intensity level when exercising independently Short Term: Able to use Dyspnea scale daily in rehab to express subjective sense of shortness of breath during exertion;Long Term: Able to use Dyspnea scale to guide intensity level when exercising independently      Knowledge and understanding of Target Heart Rate Range (THRR) Yes Yes      Intervention Provide education and explanation of THRR including how the numbers were predicted and where they are located for reference Provide education and explanation of THRR including how the numbers were predicted and where they are located for reference      Expected Outcomes Short Term: Able to state/look up THRR;Long Term: Able to use THRR to  govern intensity when exercising independently;Short Term: Able to use daily as guideline for intensity in rehab Short Term: Able to state/look up THRR;Long Term: Able to use THRR to govern intensity when exercising independently;Short Term: Able to use daily as guideline for intensity in rehab      Understanding of Exercise Prescription Yes Yes      Intervention Provide education, explanation, and written materials on patient's individual exercise prescription Provide education, explanation, and written materials on patient's individual exercise prescription      Expected Outcomes Short Term: Able to explain program exercise prescription;Long Term: Able to explain home exercise prescription to exercise independently Short Term: Able to explain program exercise prescription;Long Term: Able to explain home exercise prescription to exercise independently               Exercise Goals Re-Evaluation:  Exercise Goals Re-Evaluation     Row Name 10/10/20 1123             Exercise Goal Re-Evaluation   Exercise Goals Review Increase Physical Activity;Increase Strength and Stamina;Able to understand and use rate of perceived exertion (RPE) scale;Able to understand and use Dyspnea scale;Knowledge and understanding of Target Heart Rate Range (THRR);Understanding of Exercise Prescription       Comments Pt has attended 6 exercise sessions. She is limited by her knee, but has seen some improvement since she began using voltaren gel. She is motivated to work through the pain and is slowly progressing. She is currently exercising at 1.8 METs on the stepper. Will continue to monitor and progress as able.       Expected Outcomes Through exercise at home and at rehab, the patient will reach their stated goals.                Nutrition & Weight - Outcomes:  Pre Biometrics - 09/12/20 1409       Pre Biometrics   Height 5\' 4"  (1.626 m)    Weight 176 lb 12.9 oz (80.2 kg)    Waist Circumference 45 inches     Hip Circumference 46.5 inches    Waist to Hip Ratio 0.97 %    BMI (Calculated) 30.33    Triceps Skinfold 29 mm    % Body Fat 44.8 %    Grip Strength 22 kg    Flexibility 0 in    Single Leg Stand 0 seconds              Nutrition:  Nutrition Therapy & Goals - 09/06/20 0734       Personal Nutrition Goals   Comments Patient scored 68 on her diet assessment. We offer 2 educational sessions on heart heathy nutrition with handouts and assistance with  RD referral if interested.      Intervention Plan   Intervention Nutrition handout(s) given to patient.             Nutrition Discharge:  Nutrition Assessments - 09/04/20 1241       MEDFICTS Scores   Pre Score 68             Education Questionnaire Score:  Knowledge Questionnaire Score - 09/04/20 1243       Knowledge Questionnaire Score   Pre Score 16/18             Goals reviewed with patient; copy given to patient. Pt is discharged from pulmonary rehab after 7 sessions. She had left knee pain while she was in the program, and at times she would rate the pain a 10/10. She had originally planned to get a cortisone shot in the knee, but she reports that nothing is relieving the pain. Due to this, she requested to be discharged from the program.

## 2020-11-02 NOTE — Addendum Note (Signed)
Encounter addended by: Philis Kendall on: 11/02/2020 12:14 PM  Actions taken: Episode resolved, Flowsheet accepted, Clinical Note Signed

## 2020-11-07 ENCOUNTER — Encounter (HOSPITAL_COMMUNITY): Payer: Medicare HMO

## 2020-11-09 ENCOUNTER — Telehealth: Payer: Self-pay | Admitting: Cardiology

## 2020-11-09 ENCOUNTER — Encounter (HOSPITAL_COMMUNITY): Payer: Medicare HMO

## 2020-11-09 NOTE — Telephone Encounter (Signed)
Pt called stating that she's needing to take a pain medication for the back/hip pain she's having. Would like to make sure it's ok for her to take one.    Please call [phone]

## 2020-11-09 NOTE — Telephone Encounter (Signed)
Advised that plain tylenols is most safe as long as she doesn't go over the recommended daily dose 4g/day.  Verbalized understanding.

## 2020-11-13 DIAGNOSIS — U071 COVID-19: Secondary | ICD-10-CM | POA: Diagnosis not present

## 2020-11-14 ENCOUNTER — Encounter (HOSPITAL_COMMUNITY): Payer: Medicare HMO

## 2020-11-16 ENCOUNTER — Encounter (HOSPITAL_COMMUNITY): Payer: Medicare HMO

## 2020-11-21 ENCOUNTER — Encounter (HOSPITAL_COMMUNITY): Payer: Medicare HMO

## 2020-11-22 DIAGNOSIS — E7849 Other hyperlipidemia: Secondary | ICD-10-CM | POA: Diagnosis not present

## 2020-11-22 DIAGNOSIS — I10 Essential (primary) hypertension: Secondary | ICD-10-CM | POA: Diagnosis not present

## 2020-11-22 DIAGNOSIS — I5022 Chronic systolic (congestive) heart failure: Secondary | ICD-10-CM | POA: Diagnosis not present

## 2020-11-23 ENCOUNTER — Encounter (HOSPITAL_COMMUNITY): Payer: Medicare HMO

## 2020-11-28 ENCOUNTER — Encounter (HOSPITAL_COMMUNITY): Payer: Medicare HMO

## 2020-11-29 DIAGNOSIS — R531 Weakness: Secondary | ICD-10-CM | POA: Diagnosis not present

## 2020-11-30 ENCOUNTER — Encounter (HOSPITAL_COMMUNITY): Payer: Medicare HMO

## 2020-12-04 ENCOUNTER — Other Ambulatory Visit (HOSPITAL_COMMUNITY): Payer: Self-pay | Admitting: Sports Medicine

## 2020-12-04 ENCOUNTER — Other Ambulatory Visit: Payer: Self-pay | Admitting: Sports Medicine

## 2020-12-04 DIAGNOSIS — M25552 Pain in left hip: Secondary | ICD-10-CM

## 2020-12-05 ENCOUNTER — Encounter (HOSPITAL_COMMUNITY): Payer: Medicare HMO

## 2020-12-07 ENCOUNTER — Encounter (HOSPITAL_COMMUNITY): Payer: Medicare HMO

## 2020-12-12 ENCOUNTER — Encounter (HOSPITAL_COMMUNITY): Payer: Medicare HMO

## 2020-12-14 ENCOUNTER — Encounter (HOSPITAL_COMMUNITY): Payer: Medicare HMO

## 2020-12-14 DIAGNOSIS — U071 COVID-19: Secondary | ICD-10-CM | POA: Diagnosis not present

## 2020-12-18 ENCOUNTER — Other Ambulatory Visit (HOSPITAL_COMMUNITY): Payer: Medicare HMO

## 2020-12-18 ENCOUNTER — Inpatient Hospital Stay (HOSPITAL_COMMUNITY): Payer: Medicare HMO | Attending: Physician Assistant

## 2020-12-18 ENCOUNTER — Ambulatory Visit (HOSPITAL_COMMUNITY)
Admission: RE | Admit: 2020-12-18 | Discharge: 2020-12-18 | Disposition: A | Discharge status: Home / Self Care | Payer: Medicare HMO | Source: Ambulatory Visit | Attending: Physician Assistant | Admitting: Physician Assistant

## 2020-12-18 ENCOUNTER — Telehealth: Payer: Self-pay | Admitting: Cardiology

## 2020-12-18 ENCOUNTER — Other Ambulatory Visit (HOSPITAL_COMMUNITY): Payer: Self-pay

## 2020-12-18 ENCOUNTER — Other Ambulatory Visit: Payer: Self-pay

## 2020-12-18 DIAGNOSIS — C3491 Malignant neoplasm of unspecified part of right bronchus or lung: Secondary | ICD-10-CM | POA: Insufficient documentation

## 2020-12-18 DIAGNOSIS — Z85118 Personal history of other malignant neoplasm of bronchus and lung: Secondary | ICD-10-CM | POA: Insufficient documentation

## 2020-12-18 DIAGNOSIS — D509 Iron deficiency anemia, unspecified: Secondary | ICD-10-CM | POA: Insufficient documentation

## 2020-12-18 DIAGNOSIS — E538 Deficiency of other specified B group vitamins: Secondary | ICD-10-CM

## 2020-12-18 DIAGNOSIS — D649 Anemia, unspecified: Secondary | ICD-10-CM

## 2020-12-18 DIAGNOSIS — I700 Atherosclerosis of aorta: Secondary | ICD-10-CM | POA: Diagnosis not present

## 2020-12-18 DIAGNOSIS — C3490 Malignant neoplasm of unspecified part of unspecified bronchus or lung: Secondary | ICD-10-CM | POA: Diagnosis not present

## 2020-12-18 LAB — CBC WITH DIFFERENTIAL/PLATELET
Abs Immature Granulocytes: 0.01 10*3/uL (ref 0.00–0.07)
Basophils Absolute: 0 10*3/uL (ref 0.0–0.1)
Basophils Relative: 0 %
Eosinophils Absolute: 0.1 10*3/uL (ref 0.0–0.5)
Eosinophils Relative: 1 %
HCT: 36.8 % (ref 36.0–46.0)
Hemoglobin: 11.9 g/dL — ABNORMAL LOW (ref 12.0–15.0)
Immature Granulocytes: 0 %
Lymphocytes Relative: 25 %
Lymphs Abs: 1.3 10*3/uL (ref 0.7–4.0)
MCH: 31.6 pg (ref 26.0–34.0)
MCHC: 32.3 g/dL (ref 30.0–36.0)
MCV: 97.9 fL (ref 80.0–100.0)
Monocytes Absolute: 0.3 10*3/uL (ref 0.1–1.0)
Monocytes Relative: 6 %
Neutro Abs: 3.7 10*3/uL (ref 1.7–7.7)
Neutrophils Relative %: 68 %
Platelets: 187 10*3/uL (ref 150–400)
RBC: 3.76 MIL/uL — ABNORMAL LOW (ref 3.87–5.11)
RDW: 15.7 % — ABNORMAL HIGH (ref 11.5–15.5)
WBC: 5.4 10*3/uL (ref 4.0–10.5)
nRBC: 0 % (ref 0.0–0.2)

## 2020-12-18 LAB — COMPREHENSIVE METABOLIC PANEL
ALT: 21 U/L (ref 0–44)
AST: 20 U/L (ref 15–41)
Albumin: 3.8 g/dL (ref 3.5–5.0)
Alkaline Phosphatase: 47 U/L (ref 38–126)
Anion gap: 8 (ref 5–15)
BUN: 23 mg/dL (ref 8–23)
CO2: 26 mmol/L (ref 22–32)
Calcium: 9.3 mg/dL (ref 8.9–10.3)
Chloride: 104 mmol/L (ref 98–111)
Creatinine, Ser: 1.21 mg/dL — ABNORMAL HIGH (ref 0.44–1.00)
GFR, Estimated: 46 mL/min — ABNORMAL LOW (ref >60)
Glucose, Bld: 133 mg/dL — ABNORMAL HIGH (ref 70–99)
Potassium: 4.3 mmol/L (ref 3.5–5.1)
Sodium: 138 mmol/L (ref 135–145)
Total Bilirubin: 0.6 mg/dL (ref 0.3–1.2)
Total Protein: 6.9 g/dL (ref 6.5–8.1)

## 2020-12-18 LAB — IRON AND TIBC
Iron: 57 ug/dL (ref 28–170)
Saturation Ratios: 18 % (ref 10.4–31.8)
TIBC: 318 ug/dL (ref 250–450)
UIBC: 261 ug/dL

## 2020-12-18 LAB — LACTATE DEHYDROGENASE: LDH: 141 U/L (ref 98–192)

## 2020-12-18 LAB — FERRITIN: Ferritin: 139 ng/mL (ref 11–307)

## 2020-12-18 LAB — FOLATE: Folate: 38.5 ng/mL (ref >5.9)

## 2020-12-18 LAB — VITAMIN B12: Vitamin B-12: 410 pg/mL (ref 180–914)

## 2020-12-18 MED ORDER — IOHEXOL 350 MG/ML SOLN
100.0000 mL | Freq: Once | INTRAVENOUS | Status: AC | PRN
  Administered 2020-12-18: 60 mL via INTRAVENOUS

## 2020-12-18 MED ORDER — FOLIC ACID 1 MG PO TABS: 1.0000 mg | ORAL_TABLET | Freq: Every day | ORAL | 2 refills | Status: DC

## 2020-12-18 NOTE — Telephone Encounter (Signed)
New message    Patient would like to know if its ok for  her to take promethazine 6.25/5mg  cough syrup?

## 2020-12-19 ENCOUNTER — Encounter (HOSPITAL_COMMUNITY): Payer: Medicare HMO

## 2020-12-19 NOTE — Telephone Encounter (Signed)
Patient notified and verbalized understanding. 

## 2020-12-19 NOTE — Telephone Encounter (Signed)
Fwd to pharm for advice.

## 2020-12-19 NOTE — Telephone Encounter (Signed)
OK to take as needed in the short term, does have some anticholinergic effects (can cause dry mouth, confusion, etc), would not recommend using for long duration.

## 2020-12-20 ENCOUNTER — Ambulatory Visit (HOSPITAL_COMMUNITY): Payer: Medicare HMO

## 2020-12-20 ENCOUNTER — Encounter (HOSPITAL_COMMUNITY): Payer: Self-pay

## 2020-12-20 ENCOUNTER — Other Ambulatory Visit (HOSPITAL_COMMUNITY): Payer: Medicare HMO

## 2020-12-21 ENCOUNTER — Encounter (HOSPITAL_COMMUNITY): Payer: Medicare HMO

## 2020-12-22 DIAGNOSIS — E7849 Other hyperlipidemia: Secondary | ICD-10-CM | POA: Diagnosis not present

## 2020-12-22 DIAGNOSIS — I5022 Chronic systolic (congestive) heart failure: Secondary | ICD-10-CM | POA: Diagnosis not present

## 2020-12-22 DIAGNOSIS — I10 Essential (primary) hypertension: Secondary | ICD-10-CM | POA: Diagnosis not present

## 2020-12-23 LAB — METHYLMALONIC ACID, SERUM: Methylmalonic Acid, Quantitative: 131 nmol/L (ref 0–378)

## 2020-12-25 ENCOUNTER — Ambulatory Visit: Payer: Medicare HMO | Admitting: Pulmonary Disease

## 2020-12-25 ENCOUNTER — Ambulatory Visit (HOSPITAL_COMMUNITY): Payer: Medicare HMO | Admitting: Hematology

## 2020-12-26 ENCOUNTER — Encounter (HOSPITAL_COMMUNITY): Payer: Medicare HMO

## 2020-12-28 ENCOUNTER — Encounter (HOSPITAL_COMMUNITY): Payer: Medicare HMO

## 2020-12-28 DIAGNOSIS — M79672 Pain in left foot: Secondary | ICD-10-CM | POA: Diagnosis not present

## 2020-12-28 DIAGNOSIS — L03122 Acute lymphangitis of left axilla: Secondary | ICD-10-CM | POA: Diagnosis not present

## 2020-12-29 ENCOUNTER — Other Ambulatory Visit (HOSPITAL_COMMUNITY): Payer: Medicare HMO

## 2020-12-29 DIAGNOSIS — R531 Weakness: Secondary | ICD-10-CM | POA: Diagnosis not present

## 2021-01-02 ENCOUNTER — Encounter (HOSPITAL_COMMUNITY): Payer: Medicare HMO

## 2021-01-03 NOTE — Progress Notes (Signed)
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  PRIOR THERAPY: Right upper lobectomy on 05/13/2019  NGS Results: not done  CURRENT THERAPY: Intermittent IV Iron last on 09/22/2020  BRIEF ONCOLOGIC HISTORY:  Oncology History  Non-small cell cancer of right lung ([place])  06/29/2019 Initial Diagnosis   Non-small cell cancer of right lung ([facility])   06/29/2019 Cancer Staging   Staging form: Lung, AJCC 8th Edition - Clinical stage from 06/29/2019: Stage IA2 (cT1b, cN0, cM0) - Signed by Derek Jack, MD on 06/29/2019     CANCER STAGING: Cancer Staging Non-small cell cancer of right lung [facility]) Staging form: Lung, AJCC 8th Edition - Clinical stage from 06/29/2019: Stage IA2 (cT1b, cN0, cM0) - Signed by Derek Jack, MD on 06/29/2019   INTERVAL HISTORY:  [name], a 75 y.o. female, returns for routine follow-up of her stage I right lung adenocarcinoma and IDA. Maahi was last seen on 05/08/2020.   Today she reports feeling good. She reports improved energy levels which are still below baseline. She denies cough, current bleeding, hemoptysis, hematochezia, and black stools. She takes vitamin B-12, vitamin C, and vitamin D daily. She reports stable numbness and swelling in her feet.  REVIEW OF SYSTEMS:  Review of Systems  Constitutional:  Negative for appetite change (75%) and fatigue (60%).  HENT:   Negative for nosebleeds.   Respiratory:  Positive for shortness of breath. Negative for cough and hemoptysis.   Cardiovascular:  Positive for leg swelling (feet).  Gastrointestinal:  Negative for blood in stool.  Genitourinary:  Negative for hematuria and vaginal bleeding.   Musculoskeletal:  Positive for arthralgias (5/10 hip and foot) and back pain (5/10).   Neurological:  Positive for headaches and numbness (feet).  Hematological:  Does not bruise/bleed easily.  All other systems reviewed and are negative.  PAST MEDICAL/SURGICAL HISTORY:  Past Medical History:  Diagnosis Date   Anxiety neurosis    Arthritis    Asthma    CAD (coronary artery disease)    a. 08/2017: abnormal nuc-> sent directly to [facility], NSTEMI, s/p CABG (left internal mammary artery to left anterior descending, sequential saphenous vein graft to ramus intermedius branches 1 and 2).   Carotid artery disease (HCC)    Chronic anemia    Dizziness    GERD (gastroesophageal reflux disease)    Glaucoma    Heart disease    Hiatal hernia    Hyperlipemia    Hypertension    Ischemic cardiomyopathy    Joint pain    Mitral regurgitation    Postoperative atrial fibrillation ([place]) 08/2017   Pulmonary embolus ([place])    Type 2 diabetes mellitus ([place])    Past Surgical History:  Procedure Laterality Date   BACK SURGERY  2016   CORONARY ARTERY BYPASS GRAFT N/A 09/18/2017   Procedure: CORONARY ARTERY BYPASS GRAFTING (CABG);  Surgeon: [name], MD;  Location: [place];  Service: Open Heart Surgery;  Laterality: N/A;  Saphenous vein harvest. LIMA to LAD Saphenous vein (sequential) ramus 1 & 2   EYE SURGERY Bilateral    "surgery for glaucoma"   HERNIA REPAIR     INTERCOSTAL NERVE BLOCK Right 05/13/2019   Procedure: Intercostal Nerve Block;  Surgeon: [name], MD;  Location: [facility]  OR;  Service: Thoracic;  Laterality: Right;   LEFT HEART CATH AND CORONARY ANGIOGRAPHY N/A 09/15/2017   Procedure: LEFT HEART CATH AND CORONARY ANGIOGRAPHY;  Surgeon: Lorretta Harp, MD;  Location: [facility];  Service: Cardiovascular;  Laterality: N/A;   LYMPH NODE DISSECTION Right 05/13/2019   Procedure: Lymph Node Dissection;  Surgeon: [name], MD;  Location: [facility];  Service: Thoracic;  Laterality: Right;   ROTATOR CUFF REPAIR Left    x2   SHOULDER SURGERY      LEFT   TEE WITHOUT CARDIOVERSION N/A 09/18/2017   Procedure: TRANSESOPHAGEAL ECHOCARDIOGRAM (TEE);  Surgeon: [name], MD;  Location: [facility];  Service: Open Heart Surgery;  Laterality: N/A;   TOTAL KNEE ARTHROPLASTY     LEFT   VAGINAL HYSTERECTOMY     partial    SOCIAL HISTORY:  Social History   Socioeconomic History   Marital status: Married    Spouse name: [name]   Number of children: 5   Years of education: Not on file   Highest education level: Not on file  Occupational History   Not on file  Tobacco Use   Smoking status: Former    Packs/day: 0.50    Years: 35.00    Pack years: 17.50    Types: Cigarettes    Quit date: 03/25/1978    Years since quitting: 42.8   Smokeless tobacco: Never   Tobacco comments:    quit more than 30 years ago  Vaping Use   Vaping Use: Never used  Substance and Sexual Activity   Alcohol use: No   Drug use: No   Sexual activity: Not on file  Other Topics Concern   Not on file  Social History Narrative   Not on file   Social Determinants of Health   Financial Resource Strain: Not on file  Food Insecurity: Not on file  Transportation Needs: Not on file  Physical Activity: Not on file  Stress: Not on file  Social Connections: Not on file  Intimate Partner Violence: Not on file    FAMILY HISTORY:  Family History  Problem Relation Age of Onset   Diabetes Father    Heart disease Father    Breast cancer Mother    Congestive Heart Failure Brother    Anemia Daughter    Seizures Daughter    Migraines Daughter    Diabetes Daughter    Hypertension Daughter    Bladder Cancer Son    Congestive Heart Failure Son    Congestive Heart Failure Son     CURRENT MEDICATIONS:  Current Outpatient Medications  Medication Sig Dispense Refill   acetaminophen (TYLENOL) 500 MG tablet Take 1,000 mg by mouth every 6 (six) hours as needed for moderate pain.      albuterol (ACCUNEB) 0.63 MG/3ML nebulizer solution Take 3 mLs by nebulization  4 (four) times daily.     albuterol (VENTOLIN HFA) 108 (90 Base) MCG/ACT inhaler Inhale 2 puffs into the lungs every 6 (six) hours as needed.     ALPRAZolam (XANAX) 0.5 MG tablet Take 0.5 mg by mouth 2 (two) times daily.     amLODipine (NORVASC) 10 MG tablet Take 10 mg by mouth daily.     aspirin 81 MG tablet Take 1 tablet (81 mg total) by mouth daily.     atorvastatin (LIPITOR) 80 MG tablet Take 1 tablet (80 mg total) by mouth daily at 6 PM. 90 tablet 3   carvedilol (COREG) 25 MG tablet Take  1 tablet by mouth 2 (two) times daily.     cholecalciferol (VITAMIN D3) 25 MCG (1000 UNIT) tablet Take 1,000 Units by mouth daily.     ferrous sulfate 325 (65 FE) MG tablet Take 325 mg by mouth 2 (two) times daily.     fish oil-omega-3 fatty acids 1000 MG capsule Take 1 g by mouth 3 (three) times daily.      folic acid (FOLVITE) 1 MG tablet Take 1 tablet (1 mg total) by mouth daily. 30 tablet 2   furosemide (LASIX) 20 MG tablet TAKE 20 MG EVERY OTHER DAY ALTERNATING WITH 40 MG EVERY OTHER DAY 135 tablet 1   latanoprost (XALATAN) 0.005 % ophthalmic solution Place 1 drop into both eyes at bedtime.     lisinopril (ZESTRIL) 40 MG tablet TAKE 1 TABLET EVERY DAY ( DOSE INCREASE ) 90 tablet 3   magnesium oxide (MAG-OX) 400 MG tablet Take 1 tablet (400 mg total) by mouth 2 (two) times daily. (Patient taking differently: Take 400 mg by mouth daily.) 180 tablet 3   meclizine (ANTIVERT) 25 MG tablet Take 25 mg by mouth 3 (three) times daily as needed for dizziness.      metFORMIN (GLUCOPHAGE) 1000 MG tablet Take 1,000 mg by mouth daily.      Multiple Vitamins-Minerals (CENTRUM SILVER 50+WOMEN) TABS Take 1 tablet by mouth daily.      omeprazole (PRILOSEC) 40 MG capsule Take 40 mg by mouth daily.     potassium chloride SA (KLOR-CON) 20 MEQ tablet Take 1 tablet (20 mEq total) by mouth daily. TAKE 2 TABLETS DAILY FOR 3 DAYS 30 tablet 1   vitamin B-12 (CYANOCOBALAMIN) 500 MCG tablet Take 500 mcg by mouth daily.     No  current facility-administered medications for this visit.    ALLERGIES:  Allergies  Allergen Reactions   Beta Adrenergic Blockers Other (See Comments)    Dropped heart rate to the 40's   Calcium Channel Blockers Other (See Comments)    Dropped HR into the 40's   Naproxen Hives    PHYSICAL EXAM:  Performance status (ECOG): 1 - Symptomatic but completely ambulatory  There were no vitals filed for this visit. Wt Readings from Last 3 Encounters:  10/25/20 175 lb (79.4 kg)  09/26/20 179 lb 0.2 oz (81.2 kg)  09/12/20 176 lb 12.9 oz (80.2 kg)   Physical Exam Vitals reviewed.  Constitutional:      Appearance: Normal appearance.     Comments: In wheelchair  Cardiovascular:     Rate and Rhythm: Normal rate and regular rhythm.     Pulses: Normal pulses.     Heart sounds: Normal heart sounds.  Pulmonary:     Effort: Pulmonary effort is normal.     Breath sounds: Normal breath sounds.  Neurological:     General: No focal deficit present.     Mental Status: She is alert and oriented to person, place, and time.  Psychiatric:        Mood and Affect: Mood normal.        Behavior: Behavior normal.     LABORATORY DATA:  I have reviewed the labs as listed.  CBC Latest Ref Rng & Units 12/18/2020 08/18/2020 07/26/2020  WBC 4.0 - 10.5 K/uL 5.4 4.3 5.8  Hemoglobin 12.0 - 15.0 g/dL 11.9(L) 11.1(L) 9.5(L)  Hematocrit 36.0 - 46.0 % 36.8 35.5(L) 31.0(L)  Platelets 150 - 400 K/uL 187 215 322   CMP Latest Ref Rng & Units 12/18/2020 08/18/2020 07/21/2020  Glucose 70 - 99 mg/dL 133(H) 122(H) 161(H)  BUN 8 - 23 mg/dL 23 21 18   Creatinine 0.44 - 1.00 mg/dL 1.21(H) 1.17(H) 0.99  Sodium 135 - 145 mmol/L 138 136 142  Potassium 3.5 - 5.1 mmol/L 4.3 4.4 3.2(L)  Chloride 98 - 111 mmol/L 104 103 103  CO2 22 - 32 mmol/L 26 26 30   Calcium 8.9 - 10.3 mg/dL 9.3 9.2 8.9  Total Protein 6.5 - 8.1 g/dL 6.9 7.9 6.7  Total Bilirubin 0.3 - 1.2 mg/dL 0.6 0.6 1.1  Alkaline Phos 38 - 126 U/L 47 54 69  AST 15 - 41  U/L 20 19 15   ALT 0 - 44 U/L 21 20 30     DIAGNOSTIC IMAGING:  I have independently reviewed the scans and discussed with the patient. CT Chest W Contrast  Result Date: 12/18/2020 CLINICAL DATA:  Lung cancer. EXAM: CT CHEST WITH CONTRAST TECHNIQUE: Multidetector CT imaging of the chest was performed during intravenous contrast administration. CONTRAST:  38mL OMNIPAQUE IOHEXOL 350 MG/ML SOLN COMPARISON:  07/21/2020. FINDINGS: Cardiovascular: Atherosclerotic calcification of the aorta. Heart is enlarged. No pericardial effusion. Mediastinum/Nodes: Low-attenuation lesions in the thyroid measure up to 11 mm on the left. No follow-up imaging is recommended. Reference: J Am Coll Radiol. 2015 Feb;12(2): 143-50. Mediastinal lymph nodes are not enlarged by CT size criteria. No hilar or axillary adenopathy. Esophagus is unremarkable. Lungs/Pleura: Right upper lobectomy. Smudgy subpleural lymph node along the right major fissure, as before. No suspicious pulmonary nodules. Trace right pleural fluid. Airway is unremarkable. Upper Abdomen: Subcentimeter low-attenuation lesion in the dome of the liver is too small to characterize. Liver appears slightly heterogeneous. Visualized portions of the adrenal glands, left kidney, spleen, pancreas, stomach and bowel are grossly unremarkable. Musculoskeletal: Degenerative changes in the spine. No worrisome lytic or sclerotic lesions. IMPRESSION: 1. No evidence of recurrent or metastatic disease. 2. Trace right pleural fluid. 3. Liver may be steatotic. 4.  Aortic atherosclerosis (ICD10-I70.0). Electronically Signed   By: Lorin Picket M.D.   On: 12/18/2020 16:13     ASSESSMENT:  1.  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 -PET CT scan on 04/26/2019 showed mildly hypermetabolic 1.1 cm  right upper lobe pulmonary nodule with SUV 2.4.  No findings of adenopathy or metastatic disease. -Right upper lobectomy on 05/13/2019 shows 1.3 cm invasive adenocarcinoma, negative for visceral pleural invasion, LVI negative, margins negative.  Lymph nodes from stations 7, 11 R, 10 R, 4R, 12R are negative. -CT chest surveillance every 6 months for 2 to 3 years was recommended followed by noncontrast CT annually. -CT chest on 10/27/2019 showed surgical changes in the right upper lobe lobectomy with no findings of recurrence.  2 small indeterminate right lower lobe pulmonary nodules, follow-up recommended. -CT chest on 05/04/2020 with post right upper lobe resection with no evidence of recurrence or metastasis.  Clearing of bilateral pleural effusions.   2.  Normocytic anemia: -Her previous hemoglobin was 7.6 with MCV of 88. -She has mild degree of chronic kidney disease which is likely contributing to her anemia. -Colonoscopy earlier this year was reportedly normal.    3.  Weight loss: -She lost about 20-25 pounds since her surgery. -She is slowly gaining back.  Her appetite is improving.   PLAN:  1.  Stage I (PT 1 BPN  0) right upper lobe adenocarcinoma: -Reviewed CT chest with contrast from 12/18/2020 which did not show any evidence of recurrence or metastatic disease.  Trace right pleural fluid.  Fatty liver seen. - We will plan to repeat CT scan of the chest in 6 months.   2.  Normocytic anemia: -Last Venofer 300 mg on 09/22/2020. - Ferritin today is 139 and percent saturation 18.  Hemoglobin 11.9. - Denies any bleeding per rectum or melena.  No indication for parenteral iron therapy. - RTC 4 months for follow-up with repeat CBC, ferritin and iron panel.   3.  Vitamin B12 deficiency: -B12 is 410.  Continue B12 1 mg tablet daily.   Orders placed this encounter:  No orders of the defined types were placed in this encounter.    Derek Jack, MD [place] [phone]   I, Thana Ates, am acting as a scribe for Dr. Derek Jack.  I, Derek Jack MD, have reviewed the above documentation for accuracy and completeness, and I agree with the above.

## 2021-01-04 ENCOUNTER — Inpatient Hospital Stay (HOSPITAL_COMMUNITY): Payer: Medicare HMO | Attending: Hematology | Admitting: Hematology

## 2021-01-04 ENCOUNTER — Encounter (HOSPITAL_COMMUNITY): Payer: Medicare HMO

## 2021-01-04 ENCOUNTER — Other Ambulatory Visit: Payer: Self-pay

## 2021-01-04 VITALS — BP 161/60 | HR 66 | Temp 97.6°F | Resp 19 | Wt 181.0 lb

## 2021-01-04 DIAGNOSIS — D509 Iron deficiency anemia, unspecified: Secondary | ICD-10-CM | POA: Insufficient documentation

## 2021-01-04 DIAGNOSIS — Z87891 Personal history of nicotine dependence: Secondary | ICD-10-CM | POA: Insufficient documentation

## 2021-01-04 DIAGNOSIS — D649 Anemia, unspecified: Secondary | ICD-10-CM

## 2021-01-04 DIAGNOSIS — E538 Deficiency of other specified B group vitamins: Secondary | ICD-10-CM | POA: Diagnosis not present

## 2021-01-04 DIAGNOSIS — C3491 Malignant neoplasm of unspecified part of right bronchus or lung: Secondary | ICD-10-CM

## 2021-01-04 DIAGNOSIS — Z79899 Other long term (current) drug therapy: Secondary | ICD-10-CM | POA: Insufficient documentation

## 2021-01-04 DIAGNOSIS — Z85118 Personal history of other malignant neoplasm of bronchus and lung: Secondary | ICD-10-CM | POA: Insufficient documentation

## 2021-01-04 NOTE — Patient Instructions (Signed)
[place] at [facility] Discharge Instructions  You were seen and examined by Dr. Delton Coombes today.  Return as scheduled in 4 months for lab work and office visit.    Thank you for choosing [place] at [facility] to provide your oncology and hematology care.  To afford each patient quality time with our provider, please arrive at least 15 minutes before your scheduled appointment time.   If you have a lab appointment with the [facility] please come in thru the Main Entrance and check in at the main information desk.  You need to re-schedule your appointment should you arrive 10 or more minutes late.  We strive to give you quality time with our providers, and arriving late affects you and other patients whose appointments are after yours.  Also, if you no show three or more times for appointments you may be dismissed from the clinic at the providers discretion.     Again, thank you for choosing [facility].  Our hope is that these requests will decrease the amount of time that you wait before being seen by our physicians.       _____________________________________________________________  Should you have questions after your visit to [facility], please contact our office at [phone] and follow the prompts.  Our office hours are 8:00 a.m. and 4:30 p.m. Monday - Friday.  Please note that voicemails left after 4:00 p.m. may not be returned until the following business day.  We are closed weekends and major holidays.  You do have access to a nurse 24-7, just call the main number to the clinic [phone] and do not press any options, hold on the line and a nurse will answer the phone.    For prescription refill requests, have your pharmacy contact our office and allow 72 hours.    Due to Covid, you will need to wear a mask upon entering the hospital. If you do not have a mask, a mask will be given to you at the  Main Entrance upon arrival. For doctor visits, patients may have 1 support person age 24 or older with them. For treatment visits, patients can not have anyone with them due to social distancing guidelines and our immunocompromised population.

## 2021-01-05 ENCOUNTER — Encounter (HOSPITAL_COMMUNITY): Payer: Self-pay | Admitting: Hematology

## 2021-01-09 ENCOUNTER — Encounter (HOSPITAL_COMMUNITY): Payer: Medicare HMO

## 2021-01-11 ENCOUNTER — Encounter (HOSPITAL_COMMUNITY): Payer: Medicare HMO

## 2021-01-12 ENCOUNTER — Other Ambulatory Visit: Payer: Self-pay | Admitting: Cardiology

## 2021-01-13 DIAGNOSIS — U071 COVID-19: Secondary | ICD-10-CM | POA: Diagnosis not present

## 2021-01-22 DIAGNOSIS — I700 Atherosclerosis of aorta: Secondary | ICD-10-CM | POA: Diagnosis not present

## 2021-01-22 DIAGNOSIS — E7849 Other hyperlipidemia: Secondary | ICD-10-CM | POA: Diagnosis not present

## 2021-01-22 DIAGNOSIS — I10 Essential (primary) hypertension: Secondary | ICD-10-CM | POA: Diagnosis not present

## 2021-01-22 DIAGNOSIS — I5022 Chronic systolic (congestive) heart failure: Secondary | ICD-10-CM | POA: Diagnosis not present

## 2021-01-23 ENCOUNTER — Other Ambulatory Visit: Payer: Self-pay

## 2021-01-23 ENCOUNTER — Ambulatory Visit (HOSPITAL_COMMUNITY)
Admission: RE | Admit: 2021-01-23 | Discharge: 2021-01-23 | Disposition: A | Discharge status: Home / Self Care | Payer: Medicare HMO | Source: Ambulatory Visit | Attending: Pulmonary Disease | Admitting: Pulmonary Disease

## 2021-01-23 DIAGNOSIS — R0609 Other forms of dyspnea: Secondary | ICD-10-CM | POA: Insufficient documentation

## 2021-01-23 LAB — PULMONARY FUNCTION TEST
DL/VA % pred: 57 %
DL/VA: 2.34 ml/min/mmHg/L
DLCO cor % pred: 28 %
DLCO cor: 5.62 ml/min/mmHg
DLCO unc % pred: 27 %
DLCO unc: 5.34 ml/min/mmHg
FEF 25-75 Post: 1.32 L/sec
FEF 25-75 Pre: 1.12 L/sec
FEF2575-%Change-Post: 18 %
FEF2575-%Pred-Post: 88 %
FEF2575-%Pred-Pre: 74 %
FEV1-%Change-Post: 6 %
FEV1-%Pred-Post: 56 %
FEV1-%Pred-Pre: 53 %
FEV1-Post: 0.98 L
FEV1-Pre: 0.92 L
FEV1FVC-%Change-Post: -2 %
FEV1FVC-%Pred-Pre: 110 %
FEV6-%Change-Post: 9 %
FEV6-%Pred-Post: 55 %
FEV6-%Pred-Pre: 50 %
FEV6-Post: 1.19 L
FEV6-Pre: 1.09 L
FEV6FVC-%Pred-Post: 104 %
FEV6FVC-%Pred-Pre: 104 %
FVC-%Change-Post: 9 %
FVC-%Pred-Post: 53 %
FVC-%Pred-Pre: 48 %
FVC-Post: 1.19 L
FVC-Pre: 1.1 L
Post FEV1/FVC ratio: 82 %
Post FEV6/FVC ratio: 100 %
Pre FEV1/FVC ratio: 84 %
Pre FEV6/FVC Ratio: 100 %
RV % pred: 66 %
RV: 1.59 L
TLC % pred: 52 %
TLC: 2.72 L

## 2021-01-23 MED ORDER — ALBUTEROL SULFATE (2.5 MG/3ML) 0.083% IN NEBU
2.5000 mg | INHALATION_SOLUTION | Freq: Once | RESPIRATORY_TRACT | Status: AC
  Administered 2021-01-23: 2.5 mg via RESPIRATORY_TRACT

## 2021-01-24 DIAGNOSIS — I5022 Chronic systolic (congestive) heart failure: Secondary | ICD-10-CM | POA: Diagnosis not present

## 2021-01-24 DIAGNOSIS — I10 Essential (primary) hypertension: Secondary | ICD-10-CM | POA: Diagnosis not present

## 2021-01-24 DIAGNOSIS — I700 Atherosclerosis of aorta: Secondary | ICD-10-CM | POA: Diagnosis not present

## 2021-01-24 DIAGNOSIS — Z6830 Body mass index (BMI) 30.0-30.9, adult: Secondary | ICD-10-CM | POA: Diagnosis not present

## 2021-01-24 DIAGNOSIS — E7849 Other hyperlipidemia: Secondary | ICD-10-CM | POA: Diagnosis not present

## 2021-01-24 DIAGNOSIS — J441 Chronic obstructive pulmonary disease with (acute) exacerbation: Secondary | ICD-10-CM | POA: Diagnosis not present

## 2021-01-29 DIAGNOSIS — R531 Weakness: Secondary | ICD-10-CM | POA: Diagnosis not present

## 2021-02-05 DIAGNOSIS — Z1231 Encounter for screening mammogram for malignant neoplasm of breast: Secondary | ICD-10-CM | POA: Diagnosis not present

## 2021-02-06 DIAGNOSIS — Z23 Encounter for immunization: Secondary | ICD-10-CM | POA: Diagnosis not present

## 2021-02-07 ENCOUNTER — Encounter: Payer: Self-pay | Admitting: Pulmonary Disease

## 2021-02-07 ENCOUNTER — Other Ambulatory Visit: Payer: Self-pay

## 2021-02-07 ENCOUNTER — Ambulatory Visit: Payer: Medicare HMO | Admitting: Pulmonary Disease

## 2021-02-07 VITALS — BP 142/80 | HR 68 | Temp 98.2°F | Ht 65.0 in | Wt 173.8 lb

## 2021-02-07 DIAGNOSIS — J449 Chronic obstructive pulmonary disease, unspecified: Secondary | ICD-10-CM | POA: Diagnosis not present

## 2021-02-07 MED ORDER — FLUTICASONE FUROATE-VILANTEROL 100-25 MCG/ACT IN AEPB: 1.0000 | INHALATION_SPRAY | Freq: Every day | RESPIRATORY_TRACT | 0 refills | Status: DC

## 2021-02-07 MED ORDER — FLUTICASONE FUROATE-VILANTEROL 100-25 MCG/ACT IN AEPB: 1.0000 | INHALATION_SPRAY | Freq: Every day | RESPIRATORY_TRACT | 5 refills | Status: DC

## 2021-02-07 NOTE — Patient Instructions (Signed)
Breo one puff daily, and rinse your mouth after each use  Follow up in 3 months

## 2021-02-07 NOTE — Addendum Note (Signed)
Addended by: Fritzi Mandes D on: 02/07/2021 11:51 AM   Modules accepted: Orders

## 2021-02-07 NOTE — Progress Notes (Signed)
[facility]  Chief Complaint  Patient presents with   Follow-up    Feels breathing is about the same since August. PFT done 01-23-21. Using 2L O2 as needed.     Past Surgical History:  She  has a past surgical history that includes Hernia repair; Total knee arthroplasty; Shoulder surgery; LEFT HEART CATH AND CORONARY ANGIOGRAPHY (N/A, 09/15/2017); Coronary artery bypass graft (N/A, 09/18/2017); TEE without cardioversion (N/A, 09/18/2017); Back surgery (2016); Eye surgery (Bilateral); Vaginal hysterectomy; Rotator cuff repair (Left); Lymph node dissection (Right, 05/13/2019); and Intercostal nerve block (Right, 05/13/2019).  Past Medical History:  OA, CAD, Anemia, GERD, Glaucoma, Hiatal hernia, HLD, HTN, ischemic CM, CAD s/p CABG, Post op a fib, PE, DM type 2, NSCLC s/p RULectomy  Constitutional:  BP (!) 142/80   Pulse 68   Temp 98.2 F (36.8 C)   Ht 5\' 5"  (1.651 m)   Wt 173 lb 12.8 oz (78.8 kg)   SpO2 96% Comment: ra  BMI 28.92 kg/m   Brief Summary:  Karen Tate is a 75 y.o. female former smoker with with dyspnea.  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      Subjective:   She is here with her husband.  CT chest from September showed stable changes after Rt upper lobectomy.  She had PFT earlier this month.  Showed mixed obstructive and restrictive defect, borderline bronchodilator response, and severe diffusion defect.  She reports history of asthma.  Uses albuterol intermittently.  Hasn't tried any other inhalers.  Uses oxygen 2 liters with activity when she gets short of breath.   Physical Exam:   Appearance - well kempt, sitting in wheelchair  ENMT - no sinus tenderness, no oral exudate, no LAN, Mallampati 2 airway, no stridor  Respiratory - equal breath sounds bilaterally, no wheezing or rales  CV - s1s2 regular rate and rhythm,  2/6 SM  Ext - no clubbing, no edema  Skin - no rashes  Psych - normal mood and affect    Pulmonary testing:  PFT 05/12/19 >> FEV1 1.02 (59%), FEV1% 79, TLC 3.07 (60%), FEV1% 63% PFT 01/23/21 >> FEV1 0.98 (56%), FEV1% 82, TLC 2.72 (52%), DLCO 27%  Chest Imaging:  CT angio chest 07/21/20 >> CHF pattern CT chest 12/18/20 >> s/p Rt upper lobectomy, trace Rt effusion, fatty liver, atherosclerosis  Sleep Tests:  ONO with RA 09/08/20 >> test time 6 hrs 2 min.  Basal SpO2 99%, low SpO2 96%.  Cardiac Tests:  Echo 07/21/20 >> EF 60 to 65%, grade 2 DD, RVSP 61.3 mmHg, mod MR, mod TR  Social History:  She  reports that she quit smoking about 42 years ago. Her smoking use included cigarettes. She has a 17.50 pack-year smoking history. She has never used smokeless tobacco. She reports that she does not drink alcohol and does not use drugs.  Family History:  Her family history includes Anemia in her daughter; Bladder Cancer in her son; Breast cancer in her mother; Congestive Heart Failure in her brother, son, and son; Diabetes in her daughter and father; Heart disease in her father; Hypertension in her daughter; Migraines in her daughter; Seizures in her daughter.     Assessment/Plan:   COPD with asthma. - will have her try breo 100 one puff daily - prn albuterol - demonstrated inhaler technique and discussed roles for different inhalers  Chronic respiratory failure with hypoxia. - she  uses 2 liters oxygen with exertion  Stage 1 Rt upper lung adenocarcinoma s/p lobectomy 05/13/19. - followed by Dr. Delton Coombes with [facility]  Coronary artery disease, Valvular heart disease, Chronic diastolic CHF. - followed by Dr. Carlyle Dolly with [facility]  Time Spent Involved in Patient Care on Day of Examination:  32 minutes  Follow up:   Patient Instructions  Breo one puff daily, and rinse your mouth after each use  Follow up in 3 months  Medication List:   Allergies  as of 02/07/2021       Reactions   Beta Adrenergic Blockers Other (See Comments)   Dropped heart rate to the 40's   Calcium Channel Blockers Other (See Comments)   Dropped HR into the 40's   Naproxen Hives        Medication List        Accurate as of February 07, 2021 11:49 AM. If you have any questions, ask your nurse or doctor.          acetaminophen 500 MG tablet Commonly known as: TYLENOL Take 1,000 mg by mouth every 6 (six) hours as needed for moderate pain.   albuterol 108 (90 Base) MCG/ACT inhaler Commonly known as: VENTOLIN HFA Inhale 2 puffs into the lungs every 6 (six) hours as needed.   albuterol 0.63 MG/3ML nebulizer solution Commonly known as: ACCUNEB Take 3 mLs by nebulization 4 (four) times daily.   ALPRAZolam 0.5 MG tablet Commonly known as: XANAX Take 0.5 mg by mouth 2 (two) times daily.   amLODipine 10 MG tablet Commonly known as: NORVASC Take 10 mg by mouth daily.   aspirin EC 81 MG tablet Take 1 tablet (81 mg total) by mouth daily.   atorvastatin 80 MG tablet Commonly known as: LIPITOR Take 1 tablet (80 mg total) by mouth daily at 6 PM.   CALCIUM PO Take by mouth.   carvedilol 25 MG tablet Commonly known as: COREG Take 1 tablet by mouth 2 (two) times daily.   Centrum Silver 50+Women Tabs Take 1 tablet by mouth daily.   cholecalciferol 25 MCG (1000 UNIT) tablet Commonly known as: VITAMIN D3 Take 1,000 Units by mouth daily.   ferrous sulfate 325 (65 FE) MG tablet Take 325 mg by mouth 2 (two) times daily.   fish oil-omega-3 fatty acids 1000 MG capsule Take 1 g by mouth 3 (three) times daily.   fluticasone furoate-vilanterol 100-25 MCG/ACT Aepb Commonly known as: Breo Ellipta Inhale 1 puff into the lungs daily. Started by: Chesley Mires, MD   folic acid 1 MG tablet Commonly known as: FOLVITE Take 1 tablet (1 mg total) by mouth daily.   furosemide 20 MG tablet Commonly known as: LASIX TAKE 1 TABLET EVERY OTHER DAY  ALTERNATING WITH 2 TABLETS EVERY OTHER DAY  ( DOSE CHANGE )   latanoprost 0.005 % ophthalmic solution Commonly known as: XALATAN Place 1 drop into both eyes at bedtime.   lisinopril 40 MG tablet Commonly known as: ZESTRIL TAKE 1 TABLET EVERY DAY ( DOSE INCREASE )   magnesium oxide 400 MG tablet Commonly known as: MAG-OX Take 1 tablet (400 mg total) by mouth 2 (two) times daily. What changed: when to take this   meclizine 25 MG tablet Commonly known as: ANTIVERT Take 25 mg by mouth 3 (three) times daily as needed for dizziness.   metFORMIN 1000 MG tablet Commonly known as: GLUCOPHAGE Take 1,000 mg by mouth daily.   omeprazole 40 MG capsule Commonly known  as: PRILOSEC Take 40 mg by mouth daily.   potassium chloride SA 20 MEQ tablet Commonly known as: KLOR-CON Take 1 tablet (20 mEq total) by mouth daily. TAKE 2 TABLETS DAILY FOR 3 DAYS   vitamin B-12 500 MCG tablet Commonly known as: CYANOCOBALAMIN Take 500 mcg by mouth daily.        Signature:  Chesley Mires, MD [place] Pager - [phone] 02/07/2021, 11:49 AM

## 2021-02-08 ENCOUNTER — Ambulatory Visit: Payer: Medicare HMO | Admitting: Cardiology

## 2021-02-08 ENCOUNTER — Ambulatory Visit (INDEPENDENT_AMBULATORY_CARE_PROVIDER_SITE_OTHER): Payer: Medicare HMO

## 2021-02-08 DIAGNOSIS — I6523 Occlusion and stenosis of bilateral carotid arteries: Secondary | ICD-10-CM

## 2021-02-14 ENCOUNTER — Telehealth: Payer: Self-pay | Admitting: *Deleted

## 2021-02-14 NOTE — Telephone Encounter (Signed)
-----   Message from Arnoldo Lenis, MD sent at 02/12/2021 10:59 AM EST ----- Carotid US shows mild to moderate blockages in the arteries of the neck, just something to monitor at this time  Zandra Abts MD

## 2021-02-14 NOTE — Telephone Encounter (Signed)
Laurine Blazer, LPN  [id number]  4:26 PM EST Back to Top    Notified, copy to pcp.

## 2021-02-19 ENCOUNTER — Telehealth: Payer: Self-pay | Admitting: Pulmonary Disease

## 2021-02-19 NOTE — Telephone Encounter (Signed)
Called and spoke to patient. Advised he that per her last OV note from Dr. Halford Chessman she is to use Breo 100 once daily. Did let her know we have a sample of breo 100 here she can pick up. She voiced understanding. Sample at front ready for pick up

## 2021-02-20 ENCOUNTER — Other Ambulatory Visit (HOSPITAL_COMMUNITY): Payer: Self-pay | Admitting: Physician Assistant

## 2021-02-20 DIAGNOSIS — E538 Deficiency of other specified B group vitamins: Secondary | ICD-10-CM

## 2021-02-21 DIAGNOSIS — I10 Essential (primary) hypertension: Secondary | ICD-10-CM | POA: Diagnosis not present

## 2021-02-21 DIAGNOSIS — I5022 Chronic systolic (congestive) heart failure: Secondary | ICD-10-CM | POA: Diagnosis not present

## 2021-02-21 DIAGNOSIS — E7849 Other hyperlipidemia: Secondary | ICD-10-CM | POA: Diagnosis not present

## 2021-02-21 DIAGNOSIS — I700 Atherosclerosis of aorta: Secondary | ICD-10-CM | POA: Diagnosis not present

## 2021-02-28 DIAGNOSIS — R531 Weakness: Secondary | ICD-10-CM | POA: Diagnosis not present

## 2021-03-01 ENCOUNTER — Encounter: Payer: Self-pay | Admitting: Cardiology

## 2021-03-01 ENCOUNTER — Ambulatory Visit: Payer: Medicare HMO | Admitting: Cardiology

## 2021-03-01 ENCOUNTER — Other Ambulatory Visit: Payer: Self-pay

## 2021-03-01 VITALS — BP 132/60 | HR 89 | Ht 65.0 in | Wt 178.2 lb

## 2021-03-01 DIAGNOSIS — I2510 Atherosclerotic heart disease of native coronary artery without angina pectoris: Secondary | ICD-10-CM

## 2021-03-01 DIAGNOSIS — I10 Essential (primary) hypertension: Secondary | ICD-10-CM | POA: Diagnosis not present

## 2021-03-01 DIAGNOSIS — E782 Mixed hyperlipidemia: Secondary | ICD-10-CM

## 2021-03-01 DIAGNOSIS — I6523 Occlusion and stenosis of bilateral carotid arteries: Secondary | ICD-10-CM | POA: Diagnosis not present

## 2021-03-01 MED ORDER — ALBUTEROL SULFATE HFA 108 (90 BASE) MCG/ACT IN AERS: 2.0000 | INHALATION_SPRAY | Freq: Four times a day (QID) | RESPIRATORY_TRACT | 11 refills | Status: DC | PRN

## 2021-03-01 NOTE — Progress Notes (Signed)
Clinical Summary Karen Tate is a 75 y.o.femaleseen today for follow up of the following medical problems.    1. CAD - prior CABG in 08/2017 (left internal mammary artery to left anterior descending, sequential saphenous vein graft to ramus intermedius branches 1 and 2). She presented with an NSTEMI -  10/2018 echo LVEF 60-65%   - no recent chest pain      2. Carotid stenosis - 6/20219 carotid US: RICA 2-62%, LICA 03-55%  97/4163 RICA 8-45%, LICA 36-46% [id number] RICA 2-48%, LICA 25-00%   3. Hyperlipidemia - pcp follows labs - she ison atorvastatin 80mg  daily   4. HTN - bp is at goal, continue current meds   5. Mitral regurgitation - 10/2018 echo moderate MR - 06/2020 echo mod MR, mod TR   6. Chronic diastolic HF - 05/7046 admission with SOB, thought to be volume overloaded and diuresed - 06/2020 echo LVEF 60-65%, grade II dd, mild RV dysfunction, PASP 61 mmHg - SOB improved, still some SOB - home weights up to 188 lbs prior to her admission. Normal weight 177-178 lbs at baseline.  - taking lasix 40mg  daily. Keeps weight down but having some cramps    -some swelling at times. She is lasix 20mg  and 40mg , though has been taking 40mg  daily - home weights 175 and stable   7. Pulmonary HTN - 06/2020 echo LVEF 60-65%, grade II dd, mild RV dysfunction, PASP 61 mmHg     8. Lung cancer - history of stage Ia nonsmall cell lung CA - prior RUL lobectomy   9. History of PE   10. COPD - 04/2019 PFTs moderate obstruction, severe restriction, moderate diffusion defect - followed by pcp  - recent SOB. Some wheezing.  - improves with nebulizer.  Past Medical History:  Diagnosis Date   Anxiety neurosis    Arthritis    Asthma    CAD (coronary artery disease)    a. 08/2017: abnormal nuc-> sent directly to [facility], NSTEMI, s/p CABG (left internal mammary artery to left anterior descending, sequential saphenous vein graft to ramus intermedius branches 1 and 2).   Carotid artery disease  (HCC)    Chronic anemia    Dizziness    GERD (gastroesophageal reflux disease)    Glaucoma    Heart disease    Hiatal hernia    Hyperlipemia    Hypertension    Ischemic cardiomyopathy    Joint pain    Mitral regurgitation    Postoperative atrial fibrillation ([place]) 08/2017   Pulmonary embolus (HCC)    Type 2 diabetes mellitus (HCC)      Allergies  Allergen Reactions   Beta Adrenergic Blockers Other (See Comments)    Dropped heart rate to the 40's   Calcium Channel Blockers Other (See Comments)    Dropped HR into the 40's   Naproxen Hives     Current Outpatient Medications  Medication Sig Dispense Refill   acetaminophen (TYLENOL) 500 MG tablet Take 1,000 mg by mouth every 6 (six) hours as needed for moderate pain.      albuterol (ACCUNEB) 0.63 MG/3ML nebulizer solution Take 3 mLs by nebulization 4 (four) times daily.     albuterol (VENTOLIN HFA) 108 (90 Base) MCG/ACT inhaler Inhale 2 puffs into the lungs every 6 (six) hours as needed.     ALPRAZolam (XANAX) 0.5 MG tablet Take 0.5 mg by mouth 2 (two) times daily.     amLODipine (NORVASC) 10 MG tablet Take 10 mg by mouth daily.  aspirin 81 MG tablet Take 1 tablet (81 mg total) by mouth daily.     atorvastatin (LIPITOR) 80 MG tablet Take 1 tablet (80 mg total) by mouth daily at 6 PM. 90 tablet 3   CALCIUM PO Take by mouth.     carvedilol (COREG) 25 MG tablet Take 1 tablet by mouth 2 (two) times daily.     cholecalciferol (VITAMIN D3) 25 MCG (1000 UNIT) tablet Take 1,000 Units by mouth daily.     ferrous sulfate 325 (65 FE) MG tablet Take 325 mg by mouth 2 (two) times daily.     fish oil-omega-3 fatty acids 1000 MG capsule Take 1 g by mouth 3 (three) times daily.      fluticasone furoate-vilanterol (BREO ELLIPTA) 100-25 MCG/ACT AEPB Inhale 1 puff into the lungs daily. 30 each 5   fluticasone furoate-vilanterol (BREO ELLIPTA) 100-25 MCG/ACT AEPB Inhale 1 puff into the lungs daily. 60 each 0   folic acid (FOLVITE) 1 MG tablet  TAKE 1 TABLET EVERY DAY 90 tablet 3   furosemide (LASIX) 20 MG tablet TAKE 1 TABLET EVERY OTHER DAY ALTERNATING WITH 2 TABLETS EVERY OTHER DAY  ( DOSE CHANGE ) 135 tablet 1   latanoprost (XALATAN) 0.005 % ophthalmic solution Place 1 drop into both eyes at bedtime.     lisinopril (ZESTRIL) 40 MG tablet TAKE 1 TABLET EVERY DAY ( DOSE INCREASE ) 90 tablet 3   magnesium oxide (MAG-OX) 400 MG tablet Take 1 tablet (400 mg total) by mouth 2 (two) times daily. (Patient taking differently: Take 400 mg by mouth daily.) 180 tablet 3   meclizine (ANTIVERT) 25 MG tablet Take 25 mg by mouth 3 (three) times daily as needed for dizziness.      metFORMIN (GLUCOPHAGE) 1000 MG tablet Take 1,000 mg by mouth daily.      Multiple Vitamins-Minerals (CENTRUM SILVER 50+WOMEN) TABS Take 1 tablet by mouth daily.      omeprazole (PRILOSEC) 40 MG capsule Take 40 mg by mouth daily.     potassium chloride SA (KLOR-CON) 20 MEQ tablet Take 1 tablet (20 mEq total) by mouth daily. TAKE 2 TABLETS DAILY FOR 3 DAYS 30 tablet 1   vitamin B-12 (CYANOCOBALAMIN) 500 MCG tablet Take 500 mcg by mouth daily.     No current facility-administered medications for this visit.     Past Surgical History:  Procedure Laterality Date   BACK SURGERY  2016   CORONARY ARTERY BYPASS GRAFT N/A 09/18/2017   Procedure: CORONARY ARTERY BYPASS GRAFTING (CABG);  Surgeon: [name], MD;  Location: [facility];  Service: Open Heart Surgery;  Laterality: N/A;  Saphenous vein harvest. LIMA to LAD Saphenous vein (sequential) ramus 1 & 2   EYE SURGERY Bilateral    "surgery for glaucoma"   HERNIA REPAIR     INTERCOSTAL NERVE BLOCK Right 05/13/2019   Procedure: Intercostal Nerve Block;  Surgeon: [name], MD;  Location: [facility] OR;  Service: Thoracic;  Laterality: Right;   LEFT HEART CATH AND CORONARY ANGIOGRAPHY N/A 09/15/2017   Procedure: LEFT HEART CATH AND CORONARY ANGIOGRAPHY;  Surgeon: Lorretta Harp, MD;  Location: [place] [facility];   Service: Cardiovascular;  Laterality: N/A;   LYMPH NODE DISSECTION Right 05/13/2019   Procedure: Lymph Node Dissection;  Surgeon: [name], MD;  Location: [facility] OR;  Service: Thoracic;  Laterality: Right;   ROTATOR CUFF REPAIR Left    x2   SHOULDER SURGERY     LEFT   TEE WITHOUT  CARDIOVERSION N/A 09/18/2017   Procedure: TRANSESOPHAGEAL ECHOCARDIOGRAM (TEE);  Surgeon: [name], MD;  Location: [facility];  Service: Open Heart Surgery;  Laterality: N/A;   TOTAL KNEE ARTHROPLASTY     LEFT   VAGINAL HYSTERECTOMY     partial     Allergies  Allergen Reactions   Beta Adrenergic Blockers Other (See Comments)    Dropped heart rate to the 40's   Calcium Channel Blockers Other (See Comments)    Dropped HR into the 40's   Naproxen Hives      Family History  Problem Relation Age of Onset   Diabetes Father    Heart disease Father    Breast cancer Mother    Congestive Heart Failure Brother    Anemia Daughter    Seizures Daughter    Migraines Daughter    Diabetes Daughter    Hypertension Daughter    Bladder Cancer Son    Congestive Heart Failure Son    Congestive Heart Failure Son      Social History Karen Tate reports that she quit smoking about 42 years ago. Her smoking use included cigarettes. She has a 17.50 pack-year smoking history. She has never used smokeless tobacco. Karen Tate reports no history of alcohol use.   Review of Systems CONSTITUTIONAL: No weight loss, fever, chills, weakness or fatigue.  HEENT: Eyes: No visual loss, blurred vision, double vision or yellow sclerae.No hearing loss, sneezing, congestion, runny nose or sore throat.  SKIN: No rash or itching.  CARDIOVASCULAR: per hpi RESPIRATORY: No shortness of breath, cough or sputum.  GASTROINTESTINAL: No anorexia, nausea, vomiting or diarrhea. No abdominal pain or blood.  GENITOURINARY: No burning on urination, no polyuria NEUROLOGICAL: No headache, dizziness, syncope, paralysis, ataxia,  numbness or tingling in the extremities. No change in bowel or bladder control.  MUSCULOSKELETAL: No muscle, back pain, joint pain or stiffness.  LYMPHATICS: No enlarged nodes. No history of splenectomy.  PSYCHIATRIC: No history of depression or anxiety.  ENDOCRINOLOGIC: No reports of sweating, cold or heat intolerance. No polyuria or polydipsia.  Marland Kitchen   Physical Examination Today's Vitals   03/01/21 1137  BP: 132/60  Pulse: 89  SpO2: 93%  Weight: 178 lb 3.2 oz (80.8 kg)  Height: 5\' 5"  (1.651 m)   Body mass index is 29.65 kg/m.  Gen: resting comfortably, no acute distress HEENT: no scleral icterus, pupils equal round and reactive, no palptable cervical adenopathy,  CV: RRR, no m/rg, no jvd Resp: Clear to auscultation bilaterally GI: abdomen is soft, non-tender, non-distended, normal bowel sounds, no hepatosplenomegaly MSK: extremities are warm, no edema.  Skin: warm, no rash Neuro:  no focal deficits Psych: appropriate affect   Diagnostic Studies  Echocardiogram 11/18/2018:  1. The left ventricle has normal systolic function with an ejection  fraction of 60-65%. The cavity size was normal. Left ventricular diastolic  parameters were normal.   2. The right ventricle has mildly reduced systolic function. The cavity  was normal. There is no increase in right ventricular wall thickness.  Right ventricular systolic pressure is moderately elevated with an  estimated pressure of 49.5 mmHg.   3. The aortic valve is tricuspid. Mild calcification of the aortic valve.  Mild to moderate aortic annular calcification noted.   4. The mitral valve is degenerative. Mild thickening of the mitral valve  leaflet. Mild calcification of the mitral valve leaflet. Mitral valve  regurgitation is moderate by color flow Doppler. The MR jet is eccentric  posteriorly directed.   5.  The tricuspid valve is grossly normal.   6. The aorta is normal unless otherwise noted.      01/2020 carotid  US Summary:  Right Carotid: Velocities in the right ICA are consistent with a 1-39%  stenosis.                 The ECA appears >50% stenosed.   Left Carotid: Velocities in the left ICA are consistent with a 40-59%  stenosis.   Vertebrals:  Bilateral vertebral arteries demonstrate antegrade flow.  Subclavians: Normal flow hemodynamics were seen in bilateral subclavian               arteries.    06/2020 echo   IMPRESSIONS     1. Left ventricular ejection fraction, by estimation, is 60 to 65%. The  left ventricle has normal function. The left ventricle demonstrates  regional wall motion abnormalities (see scoring diagram/findings for  description). Left ventricular diastolic  parameters are consistent with Grade II diastolic dysfunction  (pseudonormalization).   2. Right ventricular systolic function is mildly reduced. The right  ventricular size is normal. There is severely elevated pulmonary artery  systolic pressure. The estimated right ventricular systolic pressure is  74.0 mmHg.   3. The mitral valve is abnormal. Moderate mitral valve regurgitation.   4. Tricuspid valve regurgitation is moderate.   5. The aortic valve is tricuspid. There is mild calcification of the  aortic valve. Aortic valve regurgitation is not visualized.   6. The inferior vena cava is normal in size with <50% respiratory  variability, suggesting right atrial pressure of 8 mmHg.    Assessment and Plan  1. CAD - denies any symptoms, continue current meds   2. HTN - at goal, continue current meds   3. Hyperlipidemia - continue atorvastatin, labs followed by pcp   5. Carotid stenosis - stable mild to moderate disease, continue to monitor.    6. Pulmonary HTN - noted by recent echo. Most likely secondary to her chronic O2 dependent lung disease, left sided heart disease. I think the possibility of vasodilators having a role is quite small, would not pursue further testing at this time   7. COPD -  per primary, some recent SOB/DOE and wheezing, managed by Dr Halford Chessman      Arnoldo Lenis, M.D.

## 2021-03-01 NOTE — Patient Instructions (Signed)

## 2021-03-13 DIAGNOSIS — L03122 Acute lymphangitis of left axilla: Secondary | ICD-10-CM | POA: Diagnosis not present

## 2021-03-13 DIAGNOSIS — M79672 Pain in left foot: Secondary | ICD-10-CM | POA: Diagnosis not present

## 2021-03-14 ENCOUNTER — Other Ambulatory Visit (HOSPITAL_COMMUNITY): Payer: Self-pay | Admitting: Cardiology

## 2021-03-14 DIAGNOSIS — I6523 Occlusion and stenosis of bilateral carotid arteries: Secondary | ICD-10-CM

## 2021-03-22 ENCOUNTER — Telehealth: Payer: Self-pay | Admitting: Pulmonary Disease

## 2021-03-22 NOTE — Telephone Encounter (Signed)
Pt was to follow-up regarding something less expensive than Breo.  Please advise.   Patient would like a less expensive option other than Breo..   Dr. Halford Chessman please advise.

## 2021-03-23 DIAGNOSIS — J441 Chronic obstructive pulmonary disease with (acute) exacerbation: Secondary | ICD-10-CM | POA: Diagnosis not present

## 2021-03-23 DIAGNOSIS — I10 Essential (primary) hypertension: Secondary | ICD-10-CM | POA: Diagnosis not present

## 2021-03-23 DIAGNOSIS — I700 Atherosclerosis of aorta: Secondary | ICD-10-CM | POA: Diagnosis not present

## 2021-03-23 DIAGNOSIS — E7849 Other hyperlipidemia: Secondary | ICD-10-CM | POA: Diagnosis not present

## 2021-03-24 NOTE — Telephone Encounter (Signed)
Please have pharmacy team check with her insurance formulary to see if there is LABA/ICS combination that has better coverage than breo.

## 2021-03-28 NOTE — Telephone Encounter (Signed)
Pt following up regarding less expensive option for Breo.  Please advise.  [phone]

## 2021-03-29 ENCOUNTER — Encounter (HOSPITAL_COMMUNITY): Payer: Self-pay | Admitting: Hematology

## 2021-03-29 ENCOUNTER — Other Ambulatory Visit (HOSPITAL_COMMUNITY): Payer: Self-pay

## 2021-03-29 NOTE — Telephone Encounter (Signed)
Called patient and she states the $40 co pay is too much for her right now. She would like to know if there are any other inhalers available with her new insurance that are under $40.   Will route back to Rx pool.

## 2021-03-29 NOTE — Telephone Encounter (Signed)
Through pt's insurance, Karen Tate is about $40 while Advair Diskus (when filled as generic) is around $12. Considering the difference in pharmacy acquisition costs between [place] and generic Advair is around $500, making this switch should also help prolong the time it takes for pt to enter the coverage gap.

## 2021-03-29 NOTE — Telephone Encounter (Signed)
Called and spoke with patient. She stated that her insurance has since changed. She forgot to inform us of the insurance change when she originally called.   She now has [employer]. Member ID: [id number] [place]: [phone].   Pharmacy Team, can we check the formulary with [employer]? Thanks!

## 2021-03-30 ENCOUNTER — Other Ambulatory Visit: Payer: Self-pay

## 2021-03-30 MED ORDER — FLUTICASONE-SALMETEROL 250-50 MCG/ACT IN AEPB: 1.0000 | INHALATION_SPRAY | Freq: Two times a day (BID) | RESPIRATORY_TRACT | 11 refills | Status: DC

## 2021-03-30 NOTE — Telephone Encounter (Signed)
Okay to try generic advair 250/50 one puff bid.

## 2021-03-30 NOTE — Telephone Encounter (Signed)
Per my previous message, generic Advair diskus is $12. That test claim was run through the patient's current insurance.

## 2021-03-30 NOTE — Telephone Encounter (Signed)
Called and spoke to patient. She is willing and very grateful to try the advair inhaler. Confirmed pharmacy with [facility]: [facility].  Advised patient to call us for any concerns or issues. Nothing further needed.

## 2021-03-30 NOTE — Telephone Encounter (Signed)
Dr. Halford Chessman please advise if generic Advair would be okay to order for patient? Thanks!

## 2021-04-14 DIAGNOSIS — S39012A Strain of muscle, fascia and tendon of lower back, initial encounter: Secondary | ICD-10-CM | POA: Diagnosis not present

## 2021-04-18 ENCOUNTER — Encounter (HOSPITAL_COMMUNITY): Payer: Self-pay | Admitting: Hematology

## 2021-04-19 ENCOUNTER — Encounter (HOSPITAL_COMMUNITY): Payer: Self-pay | Admitting: Hematology

## 2021-04-19 DIAGNOSIS — E785 Hyperlipidemia, unspecified: Secondary | ICD-10-CM | POA: Diagnosis not present

## 2021-04-19 DIAGNOSIS — E1142 Type 2 diabetes mellitus with diabetic polyneuropathy: Secondary | ICD-10-CM | POA: Diagnosis not present

## 2021-04-19 DIAGNOSIS — E261 Secondary hyperaldosteronism: Secondary | ICD-10-CM | POA: Diagnosis not present

## 2021-04-19 DIAGNOSIS — F419 Anxiety disorder, unspecified: Secondary | ICD-10-CM | POA: Diagnosis not present

## 2021-04-19 DIAGNOSIS — I509 Heart failure, unspecified: Secondary | ICD-10-CM | POA: Diagnosis not present

## 2021-04-19 DIAGNOSIS — E538 Deficiency of other specified B group vitamins: Secondary | ICD-10-CM | POA: Diagnosis not present

## 2021-04-19 DIAGNOSIS — E559 Vitamin D deficiency, unspecified: Secondary | ICD-10-CM | POA: Diagnosis not present

## 2021-04-19 DIAGNOSIS — J449 Chronic obstructive pulmonary disease, unspecified: Secondary | ICD-10-CM | POA: Diagnosis not present

## 2021-04-19 DIAGNOSIS — E669 Obesity, unspecified: Secondary | ICD-10-CM | POA: Diagnosis not present

## 2021-04-19 DIAGNOSIS — E1169 Type 2 diabetes mellitus with other specified complication: Secondary | ICD-10-CM | POA: Diagnosis not present

## 2021-04-19 DIAGNOSIS — E876 Hypokalemia: Secondary | ICD-10-CM | POA: Diagnosis not present

## 2021-04-19 DIAGNOSIS — I110 Hypertensive heart disease with heart failure: Secondary | ICD-10-CM | POA: Diagnosis not present

## 2021-04-24 DIAGNOSIS — J441 Chronic obstructive pulmonary disease with (acute) exacerbation: Secondary | ICD-10-CM | POA: Diagnosis not present

## 2021-04-24 DIAGNOSIS — E7849 Other hyperlipidemia: Secondary | ICD-10-CM | POA: Diagnosis not present

## 2021-04-24 DIAGNOSIS — I700 Atherosclerosis of aorta: Secondary | ICD-10-CM | POA: Diagnosis not present

## 2021-04-24 DIAGNOSIS — I10 Essential (primary) hypertension: Secondary | ICD-10-CM | POA: Diagnosis not present

## 2021-04-24 DIAGNOSIS — I5022 Chronic systolic (congestive) heart failure: Secondary | ICD-10-CM | POA: Diagnosis not present

## 2021-04-25 ENCOUNTER — Inpatient Hospital Stay (HOSPITAL_COMMUNITY): Payer: PPO | Attending: Hematology

## 2021-04-25 ENCOUNTER — Other Ambulatory Visit: Payer: Self-pay

## 2021-04-25 DIAGNOSIS — N189 Chronic kidney disease, unspecified: Secondary | ICD-10-CM | POA: Insufficient documentation

## 2021-04-25 DIAGNOSIS — Z79899 Other long term (current) drug therapy: Secondary | ICD-10-CM | POA: Insufficient documentation

## 2021-04-25 DIAGNOSIS — Z87891 Personal history of nicotine dependence: Secondary | ICD-10-CM | POA: Insufficient documentation

## 2021-04-25 DIAGNOSIS — D509 Iron deficiency anemia, unspecified: Secondary | ICD-10-CM | POA: Insufficient documentation

## 2021-04-25 DIAGNOSIS — D649 Anemia, unspecified: Secondary | ICD-10-CM

## 2021-04-25 DIAGNOSIS — Z85118 Personal history of other malignant neoplasm of bronchus and lung: Secondary | ICD-10-CM | POA: Insufficient documentation

## 2021-04-25 DIAGNOSIS — E538 Deficiency of other specified B group vitamins: Secondary | ICD-10-CM | POA: Diagnosis not present

## 2021-04-25 DIAGNOSIS — R634 Abnormal weight loss: Secondary | ICD-10-CM | POA: Diagnosis not present

## 2021-04-25 LAB — CBC WITH DIFFERENTIAL/PLATELET
Abs Immature Granulocytes: 0.02 10*3/uL (ref 0.00–0.07)
Basophils Absolute: 0 10*3/uL (ref 0.0–0.1)
Basophils Relative: 1 %
Eosinophils Absolute: 0.1 10*3/uL (ref 0.0–0.5)
Eosinophils Relative: 1 %
HCT: 33.6 % — ABNORMAL LOW (ref 36.0–46.0)
Hemoglobin: 10.1 g/dL — ABNORMAL LOW (ref 12.0–15.0)
Immature Granulocytes: 0 %
Lymphocytes Relative: 20 %
Lymphs Abs: 1 10*3/uL (ref 0.7–4.0)
MCH: 29.9 pg (ref 26.0–34.0)
MCHC: 30.1 g/dL (ref 30.0–36.0)
MCV: 99.4 fL (ref 80.0–100.0)
Monocytes Absolute: 0.4 10*3/uL (ref 0.1–1.0)
Monocytes Relative: 7 %
Neutro Abs: 3.6 10*3/uL (ref 1.7–7.7)
Neutrophils Relative %: 71 %
Platelets: 217 10*3/uL (ref 150–400)
RBC: 3.38 MIL/uL — ABNORMAL LOW (ref 3.87–5.11)
RDW: 15.7 % — ABNORMAL HIGH (ref 11.5–15.5)
WBC: 5.2 10*3/uL (ref 4.0–10.5)
nRBC: 0 % (ref 0.0–0.2)

## 2021-04-25 LAB — IRON AND TIBC
Iron: 44 ug/dL (ref 28–170)
Saturation Ratios: 15 % (ref 10.4–31.8)
TIBC: 288 ug/dL (ref 250–450)
UIBC: 244 ug/dL

## 2021-04-25 LAB — FERRITIN: Ferritin: 102 ng/mL (ref 11–307)

## 2021-04-26 DIAGNOSIS — Z6830 Body mass index (BMI) 30.0-30.9, adult: Secondary | ICD-10-CM | POA: Diagnosis not present

## 2021-04-26 DIAGNOSIS — I700 Atherosclerosis of aorta: Secondary | ICD-10-CM | POA: Diagnosis not present

## 2021-04-26 DIAGNOSIS — I10 Essential (primary) hypertension: Secondary | ICD-10-CM | POA: Diagnosis not present

## 2021-04-26 DIAGNOSIS — I5022 Chronic systolic (congestive) heart failure: Secondary | ICD-10-CM | POA: Diagnosis not present

## 2021-04-26 DIAGNOSIS — E7849 Other hyperlipidemia: Secondary | ICD-10-CM | POA: Diagnosis not present

## 2021-04-26 DIAGNOSIS — J441 Chronic obstructive pulmonary disease with (acute) exacerbation: Secondary | ICD-10-CM | POA: Diagnosis not present

## 2021-05-02 ENCOUNTER — Other Ambulatory Visit: Payer: Self-pay

## 2021-05-02 ENCOUNTER — Inpatient Hospital Stay (HOSPITAL_COMMUNITY): Payer: PPO | Admitting: Hematology

## 2021-05-02 VITALS — BP 138/55 | HR 65 | Temp 97.8°F | Resp 18 | Ht 61.0 in | Wt 178.1 lb

## 2021-05-02 DIAGNOSIS — D509 Iron deficiency anemia, unspecified: Secondary | ICD-10-CM | POA: Diagnosis not present

## 2021-05-02 DIAGNOSIS — C3491 Malignant neoplasm of unspecified part of right bronchus or lung: Secondary | ICD-10-CM

## 2021-05-02 DIAGNOSIS — D649 Anemia, unspecified: Secondary | ICD-10-CM

## 2021-05-02 NOTE — Progress Notes (Signed)
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  PRIOR THERAPY: Right upper lobectomy on 05/13/2019  NGS Results: not done  CURRENT THERAPY: Intermittent IV Iron last on 09/22/2020  BRIEF ONCOLOGIC HISTORY:  Oncology History  Non-small cell cancer of right lung ([place])  06/29/2019 Initial Diagnosis   Non-small cell cancer of right lung ([place])   06/29/2019 Cancer Staging   Staging form: Lung, AJCC 8th Edition - Clinical stage from 06/29/2019: Stage IA2 (cT1b, cN0, cM0) - Signed by Derek Jack, MD on 06/29/2019      CANCER STAGING:  Cancer Staging  Non-small cell cancer of right lung [facility]) Staging form: Lung, AJCC 8th Edition - Clinical stage from 06/29/2019: Stage IA2 (cT1b, cN0, cM0) - Signed by Derek Jack, MD on 06/29/2019   INTERVAL HISTORY:  [name], a 76 y.o. female, returns for routine follow-up of her stage I right lung adenocarcinoma and IDA. Karen Tate was last seen on 01/04/2021.   Today she reports feeling good. She denies any current bleeding or black stools. She denies ankle swellings.   REVIEW OF SYSTEMS:  Review of Systems  Constitutional:  Negative for appetite change and fatigue.  HENT:   Negative for nosebleeds.   Respiratory:  Positive for shortness of breath.   Cardiovascular:  Negative for leg swelling.  Gastrointestinal:  Negative for blood in stool.  Genitourinary:  Negative for hematuria and vaginal bleeding.   Musculoskeletal:  Positive for arthralgias (hips and back 6/10).  Neurological:  Positive for numbness.  Hematological:  Does not bruise/bleed easily.  All other systems reviewed and are negative.  PAST MEDICAL/SURGICAL HISTORY:  Past Medical History:  Diagnosis Date   Anxiety neurosis    Arthritis     Asthma    CAD (coronary artery disease)    a. 08/2017: abnormal nuc-> sent directly to [facility], NSTEMI, s/p CABG (left internal mammary artery to left anterior descending, sequential saphenous vein graft to ramus intermedius branches 1 and 2).   Carotid artery disease (HCC)    Chronic anemia    Dizziness    GERD (gastroesophageal reflux disease)    Glaucoma    Heart disease    Hiatal hernia    Hyperlipemia    Hypertension    Ischemic cardiomyopathy    Joint pain    Mitral regurgitation    Postoperative atrial fibrillation ([place]) 08/2017   Pulmonary embolus ([place])    Type 2 diabetes mellitus ([place])    Past Surgical History:  Procedure Laterality Date   BACK SURGERY  2016   CORONARY ARTERY BYPASS GRAFT N/A 09/18/2017   Procedure: CORONARY ARTERY BYPASS GRAFTING (CABG);  Surgeon: [name], MD;  Location: [facility];  Service: Open Heart Surgery;  Laterality: N/A;  Saphenous vein harvest. LIMA to LAD Saphenous vein (sequential) ramus 1 & 2   EYE SURGERY Bilateral    "surgery for glaucoma"   HERNIA REPAIR     INTERCOSTAL NERVE BLOCK Right 05/13/2019   Procedure: Intercostal Nerve Block;  Surgeon: [name], MD;  Location: [facility] OR;  Service: Thoracic;  Laterality: Right;   LEFT HEART CATH AND CORONARY ANGIOGRAPHY N/A 09/15/2017   Procedure: LEFT HEART CATH AND CORONARY ANGIOGRAPHY;  Surgeon: Lorretta Harp, MD;  Location: [facility];  Service: Cardiovascular;  Laterality: N/A;   LYMPH NODE DISSECTION Right 05/13/2019   Procedure: Lymph Node Dissection;  Surgeon: [name], MD;  Location: [facility];  Service: Thoracic;  Laterality: Right;   ROTATOR CUFF REPAIR Left    x2   SHOULDER SURGERY     LEFT   TEE WITHOUT CARDIOVERSION N/A 09/18/2017   Procedure: TRANSESOPHAGEAL ECHOCARDIOGRAM (TEE);  Surgeon: [name], MD;  Location: [facility];  Service: Open Heart Surgery;  Laterality: N/A;   TOTAL KNEE ARTHROPLASTY     LEFT   VAGINAL HYSTERECTOMY      partial    SOCIAL HISTORY:  Social History   Socioeconomic History   Marital status: Married    Spouse name: [name]   Number of children: 5   Years of education: Not on file   Highest education level: Not on file  Occupational History   Not on file  Tobacco Use   Smoking status: Former    Packs/day: 0.50    Years: 35.00    Pack years: 17.50    Types: Cigarettes    Quit date: 03/25/1978    Years since quitting: 43.1   Smokeless tobacco: Never   Tobacco comments:    quit more than 30 years ago  Vaping Use   Vaping Use: Never used  Substance and Sexual Activity   Alcohol use: No   Drug use: No   Sexual activity: Not on file  Other Topics Concern   Not on file  Social History Narrative   Not on file   Social Determinants of Health   Financial Resource Strain: Not on file  Food Insecurity: Not on file  Transportation Needs: Not on file  Physical Activity: Not on file  Stress: Not on file  Social Connections: Not on file  Intimate Partner Violence: Not on file    FAMILY HISTORY:  Family History  Problem Relation Age of Onset   Diabetes Father    Heart disease Father    Breast cancer Mother    Congestive Heart Failure Brother    Anemia Daughter    Seizures Daughter    Migraines Daughter    Diabetes Daughter    Hypertension Daughter    Bladder Cancer Son    Congestive Heart Failure Son    Congestive Heart Failure Son     CURRENT MEDICATIONS:  Current Outpatient Medications  Medication Sig Dispense Refill   acetaminophen (TYLENOL) 500 MG tablet Take 1,000 mg by mouth every 6 (six) hours as needed for moderate pain.      albuterol (VENTOLIN HFA) 108 (90 Base) MCG/ACT inhaler Inhale 2 puffs into the lungs every 6 (six) hours as needed. 18 g 11   ALPRAZolam (XANAX) 0.5 MG tablet Take 0.5 mg by mouth 2 (two) times daily.     amLODipine (NORVASC) 10 MG tablet Take 10 mg by mouth daily.     aspirin 81 MG tablet Take 1 tablet (81 mg total) by mouth daily.      atorvastatin (LIPITOR) 80 MG tablet Take 1 tablet (80 mg total) by mouth daily at 6 PM. 90 tablet 3   CALCIUM PO Take by mouth.     carvedilol (COREG) 25 MG tablet Take 1 tablet by mouth 2 (two) times daily.     cholecalciferol (VITAMIN D3) 25 MCG (1000 UNIT) tablet Take 1,000 Units by mouth daily.     ferrous sulfate 325 (65 FE) MG tablet Take 325 mg by mouth 2 (two) times daily.  fish oil-omega-3 fatty acids 1000 MG capsule Take 1 g by mouth 3 (three) times daily.      fluticasone-salmeterol (ADVAIR) 250-50 MCG/ACT AEPB Inhale 1 puff into the lungs in the morning and at bedtime. Advair 250/50 one puff 2 times/ day 60 each 11   folic acid (FOLVITE) 1 MG tablet TAKE 1 TABLET EVERY DAY 90 tablet 3   furosemide (LASIX) 20 MG tablet TAKE 1 TABLET EVERY OTHER DAY ALTERNATING WITH 2 TABLETS EVERY OTHER DAY  ( DOSE CHANGE ) 135 tablet 1   latanoprost (XALATAN) 0.005 % ophthalmic solution Place 1 drop into both eyes at bedtime.     lisinopril (ZESTRIL) 40 MG tablet TAKE 1 TABLET EVERY DAY ( DOSE INCREASE ) 90 tablet 3   magnesium oxide (MAG-OX) 400 MG tablet Take 1 tablet (400 mg total) by mouth 2 (two) times daily. (Patient taking differently: Take 400 mg by mouth daily.) 180 tablet 3   meclizine (ANTIVERT) 25 MG tablet Take 25 mg by mouth 3 (three) times daily as needed for dizziness.      metFORMIN (GLUCOPHAGE) 1000 MG tablet Take 1,000 mg by mouth daily.      Multiple Vitamins-Minerals (CENTRUM SILVER 50+WOMEN) TABS Take 1 tablet by mouth daily.      omeprazole (PRILOSEC) 40 MG capsule Take 40 mg by mouth daily.     tiZANidine (ZANAFLEX) 4 MG tablet Take 4 mg by mouth at bedtime as needed.     traMADol (ULTRAM) 50 MG tablet Take 50 mg by mouth 3 (three) times daily as needed.     vitamin B-12 (CYANOCOBALAMIN) 500 MCG tablet Take 500 mcg by mouth daily.     potassium chloride SA (KLOR-CON) 20 MEQ tablet Take 1 tablet (20 mEq total) by mouth daily. TAKE 2 TABLETS DAILY FOR 3 DAYS 30 tablet 1   No  current facility-administered medications for this visit.    ALLERGIES:  Allergies  Allergen Reactions   Beta Adrenergic Blockers Other (See Comments)    Dropped heart rate to the 40's   Calcium Channel Blockers Other (See Comments)    Dropped HR into the 40's   Naproxen Hives    PHYSICAL EXAM:  Performance status (ECOG): 1 - Symptomatic but completely ambulatory  Vitals:   05/02/21 0818  BP: (!) 138/55  Pulse: 65  Resp: 18  Temp: 97.8 F (36.6 C)  SpO2: 98%   Wt Readings from Last 3 Encounters:  05/02/21 178 lb 1.6 oz (80.8 kg)  03/01/21 178 lb 3.2 oz (80.8 kg)  02/07/21 173 lb 12.8 oz (78.8 kg)   Physical Exam Vitals reviewed.  Constitutional:      Appearance: Normal appearance.  Cardiovascular:     Rate and Rhythm: Normal rate and regular rhythm.     Pulses: Normal pulses.     Heart sounds: Normal heart sounds.  Pulmonary:     Effort: Pulmonary effort is normal.     Breath sounds: Normal breath sounds.  Musculoskeletal:     Right lower leg: No edema.     Left lower leg: Edema (trace) present.  Neurological:     General: No focal deficit present.     Mental Status: She is alert and oriented to person, place, and time.  Psychiatric:        Mood and Affect: Mood normal.        Behavior: Behavior normal.     LABORATORY DATA:  I have reviewed the labs as listed.  CBC Latest Ref Rng &  Units 04/25/2021 12/18/2020 08/18/2020  WBC 4.0 - 10.5 K/uL 5.2 5.4 4.3  Hemoglobin 12.0 - 15.0 g/dL 10.1(L) 11.9(L) 11.1(L)  Hematocrit 36.0 - 46.0 % 33.6(L) 36.8 35.5(L)  Platelets 150 - 400 K/uL 217 187 215   CMP Latest Ref Rng & Units 12/18/2020 08/18/2020 07/21/2020  Glucose 70 - 99 mg/dL 133(H) 122(H) 161(H)  BUN 8 - 23 mg/dL 23 21 18   Creatinine 0.44 - 1.00 mg/dL 1.21(H) 1.17(H) 0.99  Sodium 135 - 145 mmol/L 138 136 142  Potassium 3.5 - 5.1 mmol/L 4.3 4.4 3.2(L)  Chloride 98 - 111 mmol/L 104 103 103  CO2 22 - 32 mmol/L 26 26 30   Calcium 8.9 - 10.3 mg/dL 9.3 9.2 8.9   Total Protein 6.5 - 8.1 g/dL 6.9 7.9 6.7  Total Bilirubin 0.3 - 1.2 mg/dL 0.6 0.6 1.1  Alkaline Phos 38 - 126 U/L 47 54 69  AST 15 - 41 U/L 20 19 15   ALT 0 - 44 U/L 21 20 30     DIAGNOSTIC IMAGING:  I have independently reviewed the scans and discussed with the patient. No results found.   ASSESSMENT:  1.  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 -PET CT scan on 04/26/2019 showed mildly hypermetabolic 1.1 cm right upper lobe pulmonary nodule with SUV 2.4.  No findings of adenopathy or metastatic disease. -Right upper lobectomy on 05/13/2019 shows 1.3 cm invasive adenocarcinoma, negative for visceral pleural invasion, LVI negative, margins negative.  Lymph nodes from stations 7, 11 R, 10 R, 4R, 12R are negative. -CT chest surveillance every 6 months for 2 to 3 years was recommended followed by noncontrast CT annually. -CT chest on 10/27/2019 showed surgical changes in the right upper lobe lobectomy with no findings of recurrence.  2 small indeterminate right lower lobe pulmonary nodules, follow-up recommended. -CT chest on 05/04/2020 with post right upper lobe resection with no evidence of recurrence or metastasis.  Clearing of bilateral pleural effusions.   2.  Normocytic anemia: -Her previous hemoglobin was 7.6 with MCV of 88. -She has mild degree of chronic kidney disease which is likely contributing to her anemia. -Colonoscopy earlier this year was reportedly normal.     3.  Weight loss: -She lost about 20-25 pounds since her surgery. -She is slowly gaining back.  Her appetite is improving.     PLAN:  1.  Stage I (PT 1 BPN 0) right upper lobe adenocarcinoma: - Last CT of the chest with contrast on 12/18/2020 did not show any evidence of recurrence or metastatic disease.  Trace right pleural fluid  and fatty liver seen. - We will plan to repeat CT of the chest without contrast in 3 months.   2.  Normocytic anemia: - Reviewed labs from 04/25/2021.  Ferritin is 102 and percent saturation is 15.  Hemoglobin dropped to 10.1 from 11.9. - I have recommended parenteral iron therapy for a total dose of 1 g. - Will evaluate CBC, ferritin and iron panel in 3 months.   3.  Vitamin B12 deficiency: - Last B12 was 410.  Continue B12 tablets daily.   Orders placed this encounter:  Orders Placed This Encounter  Procedures   CT Chest W Contrast   CT Chest Wo Contrast     Derek Jack, [place] [phone]  I, Thana Ates, am acting as a Education administrator for Dr. Derek Jack.  I, Derek Jack MD, have reviewed the above documentation for accuracy and completeness, and I agree with the above.

## 2021-05-02 NOTE — Patient Instructions (Addendum)
[facility] at [facility] Discharge Instructions   You were seen and examined today by Dr. Delton Coombes.  He reviewed your lab results.  Your hemoglobin has dropped a little since the last time we checked and your iron is low.  We will arrange for you to receive a few doses of IV iron in the clinic.   We will also arrange for you to have a CT scan of your chest prior to your next visit in 3 months.  Return as scheduled.    Thank you for choosing [place] at [facility] to provide your oncology and hematology care.  To afford each patient quality time with our provider, please arrive at least 15 minutes before your scheduled appointment time.   If you have a lab appointment with the [facility] please come in thru the Main Entrance and check in at the main information desk.  You need to re-schedule your appointment should you arrive 10 or more minutes late.  We strive to give you quality time with our providers, and arriving late affects you and other patients whose appointments are after yours.  Also, if you no show three or more times for appointments you may be dismissed from the clinic at the providers discretion.     Again, thank you for choosing [facility].  Our hope is that these requests will decrease the amount of time that you wait before being seen by our physicians.       _____________________________________________________________  Should you have questions after your visit to [facility], please contact our office at [phone] and follow the prompts.  Our office hours are 8:00 a.m. and 4:30 p.m. Monday - Friday.  Please note that voicemails left after 4:00 p.m. may not be returned until the following business day.  We are closed weekends and major holidays.  You do have access to a nurse 24-7, just call the main number to the clinic [phone] and do not press any options, hold on the line and a nurse  will answer the phone.    For prescription refill requests, have your pharmacy contact our office and allow 72 hours.    Due to Covid, you will need to wear a mask upon entering the hospital. If you do not have a mask, a mask will be given to you at the Main Entrance upon arrival. For doctor visits, patients may have 1 support person age 75 or older with them. For treatment visits, patients can not have anyone with them due to social distancing guidelines and our immunocompromised population.

## 2021-05-03 ENCOUNTER — Encounter (HOSPITAL_COMMUNITY): Payer: Self-pay

## 2021-05-03 ENCOUNTER — Inpatient Hospital Stay (HOSPITAL_COMMUNITY): Payer: PPO

## 2021-05-03 VITALS — BP 152/60 | HR 64 | Temp 97.4°F | Resp 18 | Ht 61.0 in | Wt 178.0 lb

## 2021-05-03 DIAGNOSIS — D509 Iron deficiency anemia, unspecified: Secondary | ICD-10-CM | POA: Diagnosis not present

## 2021-05-03 DIAGNOSIS — D649 Anemia, unspecified: Secondary | ICD-10-CM

## 2021-05-03 MED ORDER — SODIUM CHLORIDE 0.9 % IV SOLN: Freq: Once | INTRAVENOUS | Status: AC

## 2021-05-03 MED ORDER — ACETAMINOPHEN 325 MG PO TABS
650.0000 mg | ORAL_TABLET | Freq: Once | ORAL | Status: AC
  Administered 2021-05-03: 650 mg via ORAL
  Filled 2021-05-03: qty 2

## 2021-05-03 MED ORDER — SODIUM CHLORIDE 0.9 % IV SOLN
300.0000 mg | Freq: Once | INTRAVENOUS | Status: AC
  Administered 2021-05-03: 300 mg via INTRAVENOUS
  Filled 2021-05-03: qty 300

## 2021-05-03 MED ORDER — LORATADINE 10 MG PO TABS
10.0000 mg | ORAL_TABLET | Freq: Every day | ORAL | Status: DC
  Administered 2021-05-03: 10 mg via ORAL
  Filled 2021-05-03: qty 1

## 2021-05-03 NOTE — Addendum Note (Signed)
Addended by: Benjiman Core D on: 05/03/2021 09:08 AM   Modules accepted: Orders

## 2021-05-03 NOTE — Addendum Note (Signed)
Addended by: Wynona Neat on: 05/03/2021 08:42 AM   Modules accepted: Orders

## 2021-05-03 NOTE — Progress Notes (Signed)
Patient presents today Venofer 300 mg . Vital signs stable. Patient denies any significant changes since her last visit. Patient has no complaints today. MAR reviewed and updated.

## 2021-05-03 NOTE — Progress Notes (Signed)
Venofer 300 mg given today per MD orders. Tolerated infusion without adverse affects. Vital signs stable. No complaints at this time. Discharged from clinic by wheel chair accomanied by husband in stable condition. Alert and oriented x 3. F/U with [facility] as scheduled.

## 2021-05-11 ENCOUNTER — Other Ambulatory Visit: Payer: Self-pay

## 2021-05-11 ENCOUNTER — Inpatient Hospital Stay (HOSPITAL_COMMUNITY): Payer: PPO

## 2021-05-11 VITALS — BP 126/69 | HR 66 | Temp 97.8°F | Resp 18

## 2021-05-11 DIAGNOSIS — D509 Iron deficiency anemia, unspecified: Secondary | ICD-10-CM | POA: Diagnosis not present

## 2021-05-11 DIAGNOSIS — D649 Anemia, unspecified: Secondary | ICD-10-CM

## 2021-05-11 MED ORDER — LORATADINE 10 MG PO TABS
10.0000 mg | ORAL_TABLET | Freq: Every day | ORAL | Status: DC
  Administered 2021-05-11: 10 mg via ORAL
  Filled 2021-05-11: qty 1

## 2021-05-11 MED ORDER — SODIUM CHLORIDE 0.9 % IV SOLN
300.0000 mg | INTRAVENOUS | Status: DC
  Administered 2021-05-11: 300 mg via INTRAVENOUS
  Filled 2021-05-11: qty 300

## 2021-05-11 MED ORDER — SODIUM CHLORIDE 0.9 % IV SOLN: Freq: Once | INTRAVENOUS | Status: AC

## 2021-05-11 MED ORDER — ACETAMINOPHEN 325 MG PO TABS
650.0000 mg | ORAL_TABLET | Freq: Once | ORAL | Status: AC
  Administered 2021-05-11: 650 mg via ORAL
  Filled 2021-05-11: qty 2

## 2021-05-11 NOTE — Patient Instructions (Signed)
[facility]  Discharge Instructions: Thank you for choosing [facility] to provide your oncology and hematology care.  If you have a lab appointment with the [facility], please come in thru the Main Entrance and check in at the main information desk.  Wear comfortable clothing and clothing appropriate for easy access to any Portacath or PICC line.   We strive to give you quality time with your provider. You may need to reschedule your appointment if you arrive late (15 or more minutes).  Arriving late affects you and other patients whose appointments are after yours.  Also, if you miss three or more appointments without notifying the office, you may be dismissed from the clinic at the providers discretion.      For prescription refill requests, have your pharmacy contact our office and allow 72 hours for refills to be completed.    Today you received Venofer 300mg     BELOW ARE SYMPTOMS THAT SHOULD BE REPORTED IMMEDIATELY: *FEVER GREATER THAN 100.4 F (38 C) OR HIGHER *CHILLS OR SWEATING *NAUSEA AND VOMITING THAT IS NOT CONTROLLED WITH YOUR NAUSEA MEDICATION *UNUSUAL SHORTNESS OF BREATH *UNUSUAL BRUISING OR BLEEDING *URINARY PROBLEMS (pain or burning when urinating, or frequent urination) *BOWEL PROBLEMS (unusual diarrhea, constipation, pain near the anus) TENDERNESS IN MOUTH AND THROAT WITH OR WITHOUT PRESENCE OF ULCERS (sore throat, sores in mouth, or a toothache) UNUSUAL RASH, SWELLING OR PAIN  UNUSUAL VAGINAL DISCHARGE OR ITCHING   Items with * indicate a potential emergency and should be followed up as soon as possible or go to the Emergency Department if any problems should occur.  Please show the CHEMOTHERAPY ALERT CARD or IMMUNOTHERAPY ALERT CARD at check-in to the Emergency Department and triage nurse.  Should you have questions after your visit or need to cancel or reschedule your appointment, please contact [facility] [phone]   and follow the prompts.  Office hours are 8:00 a.m. to 4:30 p.m. Monday - Friday. Please note that voicemails left after 4:00 p.m. may not be returned until the following business day.  We are closed weekends and major holidays. You have access to a nurse at all times for urgent questions. Please call the main number to the clinic [phone] and follow the prompts.  For any non-urgent questions, you may also contact your provider using MyChart. We now offer e-Visits for anyone 91 and older to request care online for non-urgent symptoms. For details visit [email or web address].   Also download the MyChart app! Go to the app store, search "MyChart", open the app, select [facility], and log in with your MyChart username and password.  Due to Covid, a mask is required upon entering the hospital/clinic. If you do not have a mask, one will be given to you upon arrival. For doctor visits, patients may have 1 support person aged 87 or older with them. For treatment visits, patients cannot have anyone with them due to current Covid guidelines and our immunocompromised population.

## 2021-05-11 NOTE — Progress Notes (Signed)
Pt presents today for Venofer IV iron infusion per provider's order. Vital signs stable and pt voiced no new complaints at this time.  ? ?Peripheral IV started with good blood return pre and post infusion. ? ?Venofer 300 mg given today per MD orders. Tolerated infusion without adverse affects. Vital signs stable. No complaints at this time. Discharged from clinic via wheelchair in stable condition. Alert and oriented x 3. F/U with [facility] as scheduled.   ?

## 2021-05-14 DIAGNOSIS — M810 Age-related osteoporosis without current pathological fracture: Secondary | ICD-10-CM | POA: Diagnosis not present

## 2021-05-14 DIAGNOSIS — Z780 Asymptomatic menopausal state: Secondary | ICD-10-CM | POA: Diagnosis not present

## 2021-05-15 DIAGNOSIS — E1142 Type 2 diabetes mellitus with diabetic polyneuropathy: Secondary | ICD-10-CM | POA: Diagnosis not present

## 2021-05-15 DIAGNOSIS — M79676 Pain in unspecified toe(s): Secondary | ICD-10-CM | POA: Diagnosis not present

## 2021-05-15 DIAGNOSIS — L84 Corns and callosities: Secondary | ICD-10-CM | POA: Diagnosis not present

## 2021-05-15 DIAGNOSIS — B351 Tinea unguium: Secondary | ICD-10-CM | POA: Diagnosis not present

## 2021-05-17 DIAGNOSIS — I5022 Chronic systolic (congestive) heart failure: Secondary | ICD-10-CM | POA: Diagnosis not present

## 2021-05-17 DIAGNOSIS — M4807 Spinal stenosis, lumbosacral region: Secondary | ICD-10-CM | POA: Diagnosis not present

## 2021-05-17 DIAGNOSIS — Z6830 Body mass index (BMI) 30.0-30.9, adult: Secondary | ICD-10-CM | POA: Diagnosis not present

## 2021-05-17 DIAGNOSIS — G6189 Other inflammatory polyneuropathies: Secondary | ICD-10-CM | POA: Diagnosis not present

## 2021-05-18 ENCOUNTER — Other Ambulatory Visit: Payer: Self-pay

## 2021-05-18 ENCOUNTER — Inpatient Hospital Stay (HOSPITAL_COMMUNITY): Payer: PPO

## 2021-05-18 VITALS — BP 137/73 | HR 66 | Temp 97.5°F | Resp 18

## 2021-05-18 DIAGNOSIS — D509 Iron deficiency anemia, unspecified: Secondary | ICD-10-CM | POA: Diagnosis not present

## 2021-05-18 DIAGNOSIS — D649 Anemia, unspecified: Secondary | ICD-10-CM

## 2021-05-18 MED ORDER — SODIUM CHLORIDE 0.9 % IV SOLN: Freq: Once | INTRAVENOUS | Status: AC

## 2021-05-18 MED ORDER — LORATADINE 10 MG PO TABS
10.0000 mg | ORAL_TABLET | Freq: Every day | ORAL | Status: DC
  Administered 2021-05-18: 10 mg via ORAL
  Filled 2021-05-18: qty 1

## 2021-05-18 MED ORDER — SODIUM CHLORIDE 0.9 % IV SOLN
400.0000 mg | Freq: Once | INTRAVENOUS | Status: AC
  Administered 2021-05-18: 400 mg via INTRAVENOUS
  Filled 2021-05-18: qty 20

## 2021-05-18 MED ORDER — ACETAMINOPHEN 325 MG PO TABS
650.0000 mg | ORAL_TABLET | Freq: Once | ORAL | Status: AC
  Administered 2021-05-18: 650 mg via ORAL
  Filled 2021-05-18: qty 2

## 2021-05-18 NOTE — Progress Notes (Signed)
Pt presents today for Venofer IV iron infusion per provider's order. Vital signs stable and pt voiced no new complaints at this time.  Peripheral IV started with good blood return pre and post infusion.  Venofer 400 mg  given today per MD orders. Tolerated infusion without adverse affects. Vital signs stable. No complaints at this time. Discharged from clinic via wheelchair in stable condition. Alert and oriented x 3. F/U with [facility] as scheduled.

## 2021-05-18 NOTE — Patient Instructions (Signed)
[facility]  Discharge Instructions: Thank you for choosing [facility] to provide your oncology and hematology care.  If you have a lab appointment with the [facility], please come in thru the Main Entrance and check in at the main information desk.  Wear comfortable clothing and clothing appropriate for easy access to any Portacath or PICC line.   We strive to give you quality time with your provider. You may need to reschedule your appointment if you arrive late (15 or more minutes).  Arriving late affects you and other patients whose appointments are after yours.  Also, if you miss three or more appointments without notifying the office, you may be dismissed from the clinic at the providers discretion.      For prescription refill requests, have your pharmacy contact our office and allow 72 hours for refills to be completed.    Today you received Venofer IV iron   BELOW ARE SYMPTOMS THAT SHOULD BE REPORTED IMMEDIATELY: *FEVER GREATER THAN 100.4 F (38 C) OR HIGHER *CHILLS OR SWEATING *NAUSEA AND VOMITING THAT IS NOT CONTROLLED WITH YOUR NAUSEA MEDICATION *UNUSUAL SHORTNESS OF BREATH *UNUSUAL BRUISING OR BLEEDING *URINARY PROBLEMS (pain or burning when urinating, or frequent urination) *BOWEL PROBLEMS (unusual diarrhea, constipation, pain near the anus) TENDERNESS IN MOUTH AND THROAT WITH OR WITHOUT PRESENCE OF ULCERS (sore throat, sores in mouth, or a toothache) UNUSUAL RASH, SWELLING OR PAIN  UNUSUAL VAGINAL DISCHARGE OR ITCHING   Items with * indicate a potential emergency and should be followed up as soon as possible or go to the Emergency Department if any problems should occur.  Please show the CHEMOTHERAPY ALERT CARD or IMMUNOTHERAPY ALERT CARD at check-in to the Emergency Department and triage nurse.  Should you have questions after your visit or need to cancel or reschedule your appointment, please contact [facility] [phone]   and follow the prompts.  Office hours are 8:00 a.m. to 4:30 p.m. Monday - Friday. Please note that voicemails left after 4:00 p.m. may not be returned until the following business day.  We are closed weekends and major holidays. You have access to a nurse at all times for urgent questions. Please call the main number to the clinic [phone] and follow the prompts.  For any non-urgent questions, you may also contact your provider using MyChart. We now offer e-Visits for anyone 16 and older to request care online for non-urgent symptoms. For details visit [email or web address].   Also download the MyChart app! Go to the app store, search "MyChart", open the app, select [facility], and log in with your MyChart username and password.  Due to Covid, a mask is required upon entering the hospital/clinic. If you do not have a mask, one will be given to you upon arrival. For doctor visits, patients may have 1 support person aged 26 or older with them. For treatment visits, patients cannot have anyone with them due to current Covid guidelines and our immunocompromised population.

## 2021-05-21 DIAGNOSIS — H401131 Primary open-angle glaucoma, bilateral, mild stage: Secondary | ICD-10-CM | POA: Diagnosis not present

## 2021-05-21 DIAGNOSIS — H524 Presbyopia: Secondary | ICD-10-CM | POA: Diagnosis not present

## 2021-05-21 DIAGNOSIS — Z961 Presence of intraocular lens: Secondary | ICD-10-CM | POA: Diagnosis not present

## 2021-05-21 DIAGNOSIS — Z7984 Long term (current) use of oral hypoglycemic drugs: Secondary | ICD-10-CM | POA: Diagnosis not present

## 2021-05-21 DIAGNOSIS — E119 Type 2 diabetes mellitus without complications: Secondary | ICD-10-CM | POA: Diagnosis not present

## 2021-05-24 ENCOUNTER — Ambulatory Visit: Payer: PPO | Admitting: Pulmonary Disease

## 2021-05-24 ENCOUNTER — Other Ambulatory Visit: Payer: Self-pay

## 2021-05-24 ENCOUNTER — Encounter: Payer: Self-pay | Admitting: Pulmonary Disease

## 2021-05-24 VITALS — BP 128/74 | HR 66 | Temp 98.4°F | Ht 65.0 in | Wt 177.0 lb

## 2021-05-24 DIAGNOSIS — G4734 Idiopathic sleep related nonobstructive alveolar hypoventilation: Secondary | ICD-10-CM | POA: Diagnosis not present

## 2021-05-24 DIAGNOSIS — J449 Chronic obstructive pulmonary disease, unspecified: Secondary | ICD-10-CM

## 2021-05-24 NOTE — Patient Instructions (Signed)
Your goal oxygen level during the day is above 90%.  If it goes below this, then you should wear your oxygen. ? ?Use advair 1 spray in the morning and 1 spray in the evening, and rinse your mouth after each use. ? ?Use albuterol every 6 hours as needed for coughing, wheezing, or chest congestion. ? ?Follow up in 6 months. ?

## 2021-05-24 NOTE — Progress Notes (Signed)
? ?[facility] ? ?Chief Complaint  ?Patient presents with  ? Follow-up  ?  Feels advair working well. No concerns to report.   ? ? ?Past Surgical History:  ?She  has a past surgical history that includes Hernia repair; Total knee arthroplasty; Shoulder surgery; LEFT HEART CATH AND CORONARY ANGIOGRAPHY (N/A, 09/15/2017); Coronary artery bypass graft (N/A, 09/18/2017); TEE without cardioversion (N/A, 09/18/2017); Back surgery (2016); Eye surgery (Bilateral); Vaginal hysterectomy; Rotator cuff repair (Left); Lymph node dissection (Right, 05/13/2019); and Intercostal nerve block (Right, 05/13/2019). ? ?Past Medical History:  ?OA, CAD, Anemia, GERD, Glaucoma, Hiatal hernia, HLD, HTN, ischemic CM, CAD s/p CABG, Post op a fib, PE, DM type 2, NSCLC s/p RULectomy ? ?Constitutional:  ?BP 128/74 (BP Location: Left Arm, Patient Position: Sitting)   Pulse 66   Temp 98.4 ?F (36.9 ?C) (Temporal)   Ht 5\' 5"  (1.651 m)   Wt 177 lb (80.3 kg) Comment: per patinet recent weight  SpO2 97% Comment: ra  BMI 29.45 kg/m?  ? ?Brief Summary:  ?Karen Tate is a 76 y.o. female former smoker with with dyspnea.  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 ?  ? ? ? ?Subjective:  ? ?She is here with her husband. ? ?Breathing better since she started advair.  Hasn't needed to use albuterol.  Not having cough, wheeze, or sputum.  Sleeps with oxygen.  Hasn't needed to use oxygen as much during the day. ? ?Physical Exam:  ? ?Appearance - well kempt, in wheelchair ? ?ENMT - no sinus tenderness, no oral exudate, no LAN, Mallampati 2 airway, no stridor ? ?Respiratory - equal breath sounds bilaterally, no wheezing or rales ? ?CV - s1s2 regular rate and rhythm, 2/6 SM ? ?Ext - no clubbing, no edema ? ?Skin - no rashes ? ?Psych - normal mood and affect ? ? ?  ?Pulmonary testing:  ?PFT 05/12/19 >> FEV1 1.02 (59%), FEV1% 79, TLC  3.07 (60%), FEV1% 63% ?PFT 01/23/21 >> FEV1 0.98 (56%), FEV1% 82, TLC 2.72 (52%), DLCO 27% ? ?Chest Imaging:  ?CT angio chest 07/21/20 >> CHF pattern ?CT chest 12/18/20 >> s/p Rt upper lobectomy, trace Rt effusion, fatty liver, atherosclerosis ? ?Sleep Tests:  ?ONO with RA 09/08/20 >> test time 6 hrs 2 min.  Basal SpO2 99%, low SpO2 96%. ? ?Cardiac Tests:  ?Echo 07/21/20 >> EF 60 to 65%, grade 2 DD, RVSP 61.3 mmHg, mod MR, mod TR ? ?Social History:  ?She  reports that she quit smoking about 43 years ago. Her smoking use included cigarettes. She has a 17.50 pack-year smoking history. She has never used smokeless tobacco. She reports that she does not drink alcohol and does not use drugs. ? ?Family History:  ?Her family history includes Anemia in her daughter; Bladder Cancer in her son; Breast cancer in her mother; Congestive Heart Failure in her brother, son, and son; Diabetes in her daughter and father; Heart disease in her father; Hypertension in her daughter; Migraines in her daughter; Seizures in her daughter. ?  ? ? ?Assessment/Plan:  ? ?COPD with asthma. ?- breo copay was too expensive ?- continue advair 250 one puff bid ?- prn albuterol ? ?Chronic respiratory failure with hypoxia. ?- goal SpO2 > 90% ?- 2 liters at night and as needed with exertion during the day ? 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 ?- followed by Dr. Delton Coombes with [facility]  [place] ?- she will get follow up CT chest in May 2023 ? ?Coronary artery disease, Valvular heart disease, Chronic diastolic CHF. ?- followed by Dr. Carlyle Dolly with [facility] ? ?Time Spent Involved in Patient Care on Day of Examination:  ?29 minutes ? ?Follow up:  ? ?Patient Instructions  ?Your goal oxygen level during the day is above 90%.  If it goes below this, then you should wear your oxygen. ? ?Use advair 1 spray in the morning and 1 spray in the evening, and rinse your mouth after each use. ? ?Use albuterol every 6 hours as  needed for coughing, wheezing, or chest congestion. ? ?Follow up in 6 months. ? ?Medication List:  ? ?Allergies as of 05/24/2021   ? ?   Reactions  ? Beta Adrenergic Blockers Other (See Comments)  ? Dropped heart rate to the 40's  ? Calcium Channel Blockers Other (See Comments)  ? Dropped HR into the 40's  ? Naproxen Hives  ? ?  ? ?  ?Medication List  ?  ? ?  ? Accurate as of May 24, 2021 11:30 AM. If you have any questions, ask your nurse or doctor.  ?  ?  ? ?  ? ?acetaminophen 500 MG tablet ?Commonly known as: TYLENOL ?Take 1,000 mg by mouth every 6 (six) hours as needed for moderate pain. ?  ?albuterol 108 (90 Base) MCG/ACT inhaler ?Commonly known as: VENTOLIN HFA ?Inhale 2 puffs into the lungs every 6 (six) hours as needed. ?  ?ALPRAZolam 0.5 MG tablet ?Commonly known as: Duanne Moron ?Take 0.5 mg by mouth 2 (two) times daily. ?  ?amLODipine 10 MG tablet ?Commonly known as: NORVASC ?Take 10 mg by mouth daily. ?  ?aspirin EC 81 MG tablet ?Take 1 tablet (81 mg total) by mouth daily. ?  ?atorvastatin 80 MG tablet ?Commonly known as: LIPITOR ?Take 1 tablet (80 mg total) by mouth daily at 6 PM. ?  ?CALCIUM PO ?Take by mouth. ?  ?carvedilol 25 MG tablet ?Commonly known as: COREG ?Take 1 tablet by mouth 2 (two) times daily. ?  ?Centrum Silver 50+Women Tabs ?Take 1 tablet by mouth daily. ?  ?cholecalciferol 25 MCG (1000 UNIT) tablet ?Commonly known as: VITAMIN D3 ?Take 1,000 Units by mouth daily. ?  ?ferrous sulfate 325 (65 FE) MG tablet ?Take 325 mg by mouth 2 (two) times daily. ?  ?fish oil-omega-3 fatty acids 1000 MG capsule ?Take 1 g by mouth 3 (three) times daily. ?  ?fluticasone-salmeterol 250-50 MCG/ACT Aepb ?Commonly known as: ADVAIR ?Inhale 1 puff into the lungs in the morning and at bedtime. Advair 250/50 one puff 2 times/ day ?  ?folic acid 1 MG tablet ?Commonly known as: FOLVITE ?TAKE 1 TABLET EVERY DAY ?  ?furosemide 20 MG tablet ?Commonly known as: LASIX ?TAKE 1 TABLET EVERY OTHER DAY ALTERNATING WITH 2 TABLETS  EVERY OTHER DAY  ( DOSE CHANGE ) ?  ?latanoprost 0.005 % ophthalmic solution ?Commonly known as: XALATAN ?Place 1 drop into both eyes at bedtime. ?  ?lisinopril 40 MG tablet ?Commonly known as: ZESTRIL ?TAKE 1 TABLET EVERY DAY ( DOSE INCREASE ) ?  ?magnesium oxide 400 MG tablet ?Commonly known as: MAG-OX ?Take 1 tablet (400 mg total) by mouth 2 (two) times daily. ?What changed: when to take this ?  ?meclizine 25 MG tablet ?Commonly known as: ANTIVERT ?Take 25 mg by mouth 3 (three) times daily as needed for dizziness. ?  ?metFORMIN 1000 MG tablet ?Commonly known as: GLUCOPHAGE ?  Take 1,000 mg by mouth daily. ?  ?omeprazole 40 MG capsule ?Commonly known as: PRILOSEC ?Take 40 mg by mouth daily. ?  ?potassium chloride SA 20 MEQ tablet ?Commonly known as: KLOR-CON M ?Take 1 tablet (20 mEq total) by mouth daily. TAKE 2 TABLETS DAILY FOR 3 DAYS ?  ?tiZANidine 4 MG tablet ?Commonly known as: ZANAFLEX ?Take 4 mg by mouth at bedtime as needed. ?  ?traMADol 50 MG tablet ?Commonly known as: ULTRAM ?Take 50 mg by mouth 3 (three) times daily as needed. ?  ?vitamin B-12 500 MCG tablet ?Commonly known as: CYANOCOBALAMIN ?Take 500 mcg by mouth daily. ?  ? ?  ? ? ?Signature:  ?Chesley Mires, MD ?[place] ?Pager - [phone] - [phone] ?05/24/2021, 11:30 AM ?  ? ? ? ? ? ? ? ? ?

## 2021-06-13 DIAGNOSIS — U071 COVID-19: Secondary | ICD-10-CM | POA: Diagnosis not present

## 2021-06-14 ENCOUNTER — Ambulatory Visit (INDEPENDENT_AMBULATORY_CARE_PROVIDER_SITE_OTHER): Payer: PPO

## 2021-06-14 DIAGNOSIS — I6523 Occlusion and stenosis of bilateral carotid arteries: Secondary | ICD-10-CM | POA: Diagnosis not present

## 2021-06-21 ENCOUNTER — Telehealth: Payer: Self-pay | Admitting: *Deleted

## 2021-06-21 NOTE — Telephone Encounter (Signed)
Laurine Blazer, LPN  ?11/02/8865  7:37 PM EDT Back to Top  ?  ?Notified, copy to pcp.   ? ?

## 2021-06-21 NOTE — Telephone Encounter (Signed)
-----   Message from Arnoldo Lenis, MD sent at 06/20/2021 11:29 AM EDT ----- ?Carotid US shows mild to moderate blockages, we will continue to monitor at this time ? ?Zandra Abts MD ?

## 2021-07-14 DIAGNOSIS — U071 COVID-19: Secondary | ICD-10-CM | POA: Diagnosis not present

## 2021-07-22 DIAGNOSIS — E7849 Other hyperlipidemia: Secondary | ICD-10-CM | POA: Diagnosis not present

## 2021-07-22 DIAGNOSIS — I700 Atherosclerosis of aorta: Secondary | ICD-10-CM | POA: Diagnosis not present

## 2021-07-22 DIAGNOSIS — E1143 Type 2 diabetes mellitus with diabetic autonomic (poly)neuropathy: Secondary | ICD-10-CM | POA: Diagnosis not present

## 2021-07-22 DIAGNOSIS — K219 Gastro-esophageal reflux disease without esophagitis: Secondary | ICD-10-CM | POA: Diagnosis not present

## 2021-07-22 DIAGNOSIS — I5022 Chronic systolic (congestive) heart failure: Secondary | ICD-10-CM | POA: Diagnosis not present

## 2021-07-22 DIAGNOSIS — F411 Generalized anxiety disorder: Secondary | ICD-10-CM | POA: Diagnosis not present

## 2021-07-22 DIAGNOSIS — I10 Essential (primary) hypertension: Secondary | ICD-10-CM | POA: Diagnosis not present

## 2021-07-22 DIAGNOSIS — J449 Chronic obstructive pulmonary disease, unspecified: Secondary | ICD-10-CM | POA: Diagnosis not present

## 2021-07-31 ENCOUNTER — Inpatient Hospital Stay (HOSPITAL_COMMUNITY): Payer: PPO | Attending: Hematology

## 2021-07-31 ENCOUNTER — Ambulatory Visit (HOSPITAL_COMMUNITY)
Admission: RE | Admit: 2021-07-31 | Discharge: 2021-07-31 | Disposition: A | Discharge status: Home / Self Care | Payer: PPO | Source: Ambulatory Visit | Attending: Hematology | Admitting: Hematology

## 2021-07-31 DIAGNOSIS — D649 Anemia, unspecified: Secondary | ICD-10-CM | POA: Insufficient documentation

## 2021-07-31 DIAGNOSIS — C3491 Malignant neoplasm of unspecified part of right bronchus or lung: Secondary | ICD-10-CM | POA: Diagnosis not present

## 2021-07-31 DIAGNOSIS — E538 Deficiency of other specified B group vitamins: Secondary | ICD-10-CM | POA: Insufficient documentation

## 2021-07-31 DIAGNOSIS — I2510 Atherosclerotic heart disease of native coronary artery without angina pectoris: Secondary | ICD-10-CM | POA: Diagnosis not present

## 2021-07-31 DIAGNOSIS — Z79899 Other long term (current) drug therapy: Secondary | ICD-10-CM | POA: Insufficient documentation

## 2021-07-31 DIAGNOSIS — Z85118 Personal history of other malignant neoplasm of bronchus and lung: Secondary | ICD-10-CM | POA: Insufficient documentation

## 2021-07-31 DIAGNOSIS — Z87891 Personal history of nicotine dependence: Secondary | ICD-10-CM | POA: Insufficient documentation

## 2021-07-31 DIAGNOSIS — J90 Pleural effusion, not elsewhere classified: Secondary | ICD-10-CM | POA: Diagnosis not present

## 2021-07-31 DIAGNOSIS — C3411 Malignant neoplasm of upper lobe, right bronchus or lung: Secondary | ICD-10-CM | POA: Diagnosis not present

## 2021-07-31 DIAGNOSIS — K449 Diaphragmatic hernia without obstruction or gangrene: Secondary | ICD-10-CM | POA: Diagnosis not present

## 2021-07-31 LAB — CBC WITH DIFFERENTIAL/PLATELET
Abs Immature Granulocytes: 0.01 10*3/uL (ref 0.00–0.07)
Basophils Absolute: 0 10*3/uL (ref 0.0–0.1)
Basophils Relative: 1 %
Eosinophils Absolute: 0.1 10*3/uL (ref 0.0–0.5)
Eosinophils Relative: 2 %
HCT: 31.6 % — ABNORMAL LOW (ref 36.0–46.0)
Hemoglobin: 9.6 g/dL — ABNORMAL LOW (ref 12.0–15.0)
Immature Granulocytes: 0 %
Lymphocytes Relative: 30 %
Lymphs Abs: 1.1 10*3/uL (ref 0.7–4.0)
MCH: 30.6 pg (ref 26.0–34.0)
MCHC: 30.4 g/dL (ref 30.0–36.0)
MCV: 100.6 fL — ABNORMAL HIGH (ref 80.0–100.0)
Monocytes Absolute: 0.4 10*3/uL (ref 0.1–1.0)
Monocytes Relative: 10 %
Neutro Abs: 2.1 10*3/uL (ref 1.7–7.7)
Neutrophils Relative %: 57 %
Platelets: 186 10*3/uL (ref 150–400)
RBC: 3.14 MIL/uL — ABNORMAL LOW (ref 3.87–5.11)
RDW: 16.2 % — ABNORMAL HIGH (ref 11.5–15.5)
WBC: 3.7 10*3/uL — ABNORMAL LOW (ref 4.0–10.5)
nRBC: 0 % (ref 0.0–0.2)

## 2021-07-31 LAB — FERRITIN: Ferritin: 292 ng/mL (ref 11–307)

## 2021-07-31 LAB — IRON AND TIBC
Iron: 40 ug/dL (ref 28–170)
Saturation Ratios: 15 % (ref 10.4–31.8)
TIBC: 267 ug/dL (ref 250–450)
UIBC: 227 ug/dL

## 2021-07-31 LAB — COMPREHENSIVE METABOLIC PANEL
ALT: 22 U/L (ref 0–44)
AST: 17 U/L (ref 15–41)
Albumin: 3.3 g/dL — ABNORMAL LOW (ref 3.5–5.0)
Alkaline Phosphatase: 49 U/L (ref 38–126)
Anion gap: 6 (ref 5–15)
BUN: 20 mg/dL (ref 8–23)
CO2: 31 mmol/L (ref 22–32)
Calcium: 9.1 mg/dL (ref 8.9–10.3)
Chloride: 108 mmol/L (ref 98–111)
Creatinine, Ser: 1.04 mg/dL — ABNORMAL HIGH (ref 0.44–1.00)
GFR, Estimated: 55 mL/min — ABNORMAL LOW (ref >60)
Glucose, Bld: 160 mg/dL — ABNORMAL HIGH (ref 70–99)
Potassium: 3.9 mmol/L (ref 3.5–5.1)
Sodium: 145 mmol/L (ref 135–145)
Total Bilirubin: 0.7 mg/dL (ref 0.3–1.2)
Total Protein: 6.9 g/dL (ref 6.5–8.1)

## 2021-08-01 ENCOUNTER — Emergency Department (HOSPITAL_COMMUNITY): Payer: PPO

## 2021-08-01 ENCOUNTER — Other Ambulatory Visit: Payer: Self-pay

## 2021-08-01 ENCOUNTER — Encounter (HOSPITAL_COMMUNITY): Payer: Self-pay

## 2021-08-01 ENCOUNTER — Emergency Department (HOSPITAL_COMMUNITY)
Admission: EM | Admit: 2021-08-01 | Discharge: 2021-08-01 | Disposition: A | Discharge status: Home / Self Care | Payer: PPO | Attending: Emergency Medicine | Admitting: Emergency Medicine

## 2021-08-01 DIAGNOSIS — R531 Weakness: Secondary | ICD-10-CM | POA: Diagnosis not present

## 2021-08-01 DIAGNOSIS — Z7982 Long term (current) use of aspirin: Secondary | ICD-10-CM | POA: Insufficient documentation

## 2021-08-01 DIAGNOSIS — I2510 Atherosclerotic heart disease of native coronary artery without angina pectoris: Secondary | ICD-10-CM | POA: Insufficient documentation

## 2021-08-01 DIAGNOSIS — I110 Hypertensive heart disease with heart failure: Secondary | ICD-10-CM | POA: Diagnosis not present

## 2021-08-01 DIAGNOSIS — I700 Atherosclerosis of aorta: Secondary | ICD-10-CM | POA: Diagnosis not present

## 2021-08-01 DIAGNOSIS — I509 Heart failure, unspecified: Secondary | ICD-10-CM | POA: Diagnosis not present

## 2021-08-01 DIAGNOSIS — R0602 Shortness of breath: Secondary | ICD-10-CM | POA: Diagnosis not present

## 2021-08-01 LAB — CBC
HCT: 32.1 % — ABNORMAL LOW (ref 36.0–46.0)
Hemoglobin: 9.6 g/dL — ABNORMAL LOW (ref 12.0–15.0)
MCH: 29.9 pg (ref 26.0–34.0)
MCHC: 29.9 g/dL — ABNORMAL LOW (ref 30.0–36.0)
MCV: 100 fL (ref 80.0–100.0)
Platelets: 204 10*3/uL (ref 150–400)
RBC: 3.21 MIL/uL — ABNORMAL LOW (ref 3.87–5.11)
RDW: 15.9 % — ABNORMAL HIGH (ref 11.5–15.5)
WBC: 4.3 10*3/uL (ref 4.0–10.5)
nRBC: 0 % (ref 0.0–0.2)

## 2021-08-01 LAB — BASIC METABOLIC PANEL
Anion gap: 8 (ref 5–15)
BUN: 17 mg/dL (ref 8–23)
CO2: 30 mmol/L (ref 22–32)
Calcium: 8.8 mg/dL — ABNORMAL LOW (ref 8.9–10.3)
Chloride: 105 mmol/L (ref 98–111)
Creatinine, Ser: 1.05 mg/dL — ABNORMAL HIGH (ref 0.44–1.00)
GFR, Estimated: 55 mL/min — ABNORMAL LOW (ref >60)
Glucose, Bld: 131 mg/dL — ABNORMAL HIGH (ref 70–99)
Potassium: 3.6 mmol/L (ref 3.5–5.1)
Sodium: 143 mmol/L (ref 135–145)

## 2021-08-01 LAB — TROPONIN I (HIGH SENSITIVITY)
Troponin I (High Sensitivity): 10 ng/L (ref <18)
Troponin I (High Sensitivity): 10 ng/L (ref <18)

## 2021-08-01 LAB — BRAIN NATRIURETIC PEPTIDE: B Natriuretic Peptide: 315 pg/mL — ABNORMAL HIGH (ref 0.0–100.0)

## 2021-08-01 MED ORDER — CYCLOBENZAPRINE HCL 10 MG PO TABS
5.0000 mg | ORAL_TABLET | Freq: Once | ORAL | Status: AC
  Administered 2021-08-01: 5 mg via ORAL
  Filled 2021-08-01: qty 1

## 2021-08-01 MED ORDER — FUROSEMIDE 10 MG/ML IJ SOLN
60.0000 mg | Freq: Once | INTRAMUSCULAR | Status: AC
  Administered 2021-08-01: 60 mg via INTRAVENOUS
  Filled 2021-08-01: qty 6

## 2021-08-01 MED ORDER — ACETAMINOPHEN 325 MG PO TABS
650.0000 mg | ORAL_TABLET | Freq: Once | ORAL | Status: AC
  Administered 2021-08-01: 650 mg via ORAL
  Filled 2021-08-01: qty 2

## 2021-08-01 NOTE — Progress Notes (Signed)
Received call from [facility] at [facility] Radiology concerning patient's CT Chest WO Contrast. Report printed and given to Tarri Abernethy PA-C for review. ? ?Spoke with Tarri Abernethy, PA-C Patient has upcoming appointment with Dr. Delton Coombes on 08/07/2021. Called to confirm that patient is not having any change in breathing although she is short of breath. She states that is not different than her baseline.  She is aware that if she does have any increase in SOB or any other symptoms that she needs to go to the emergency department. ?

## 2021-08-01 NOTE — ED Triage Notes (Signed)
Reports that she is sob with hx of chf.  Reports bilateral swelling in lower extremities.  Reports ct scan show fluid on lungs.  Reports sob with exertion.  None noted at rest.  ?

## 2021-08-01 NOTE — Discharge Instructions (Addendum)
The fluid in your lungs on x-ray appears mild.  Continue oxygen use at home as needed. ? ?Take your Lasix twice daily for the next 5 days and then return back to your regular dosage. ? ?Call your cardiologist as discussed in the next 2-3 days.   ?Return immediately back to the ER if: ? ?Your symptoms worsen within the next 12-24 hours. ?You develop new symptoms such as new fevers, persistent vomiting, new pain, shortness of breath, or new weakness or numbness, or if you have any other concerns. ? ?

## 2021-08-01 NOTE — ED Provider Notes (Addendum)
?[facility] ?Provider Note ? ? ?CSN: [id number] ?Arrival date & time: 08/01/21  1823 ? ?  ? ?History ? ?Chief Complaint  ?Patient presents with  ? Shortness of Breath  ? ? ?Karen Tate is a 76 y.o. female. ? ?History of coronary artery disease bypass surgery and CHF on 2 L nasal cannula at home, presents with abnormal CT scan.  Her oncologist had ordered a routine CT scan of the chest which showed evidence of pulmonary edema and some pulmonary effusion.  Patient presents to the ER due to what she describes as "mild shortness of breath." ? ?Otherwise denies any fevers or cough or vomiting or diarrhea denies chest pain headache or abdominal pain.  Patient states that she typically takes her diuretic twice daily but a few months ago it was decreased to once a day.  Since then she has been noticing increased swelling in her bilateral feet. ? ? ?  ? ?Home Medications ?Prior to Admission medications   ?Medication Sig Start Date End Date Taking? Authorizing Provider  ?acetaminophen (TYLENOL) 500 MG tablet Take 1,000 mg by mouth every 6 (six) hours as needed for moderate pain.   Yes [provider]  ?albuterol (VENTOLIN HFA) 108 (90 Base) MCG/ACT inhaler Inhale 2 puffs into the lungs every 6 (six) hours as needed. 03/01/21  Yes Chesley Mires, MD  ?ALPRAZolam Duanne Moron) 0.5 MG tablet Take 0.5 mg by mouth 2 (two) times daily.   Yes [provider]  ?amLODipine (NORVASC) 10 MG tablet Take 10 mg by mouth daily.   Yes [provider]  ?aspirin 81 MG tablet Take 1 tablet (81 mg total) by mouth daily. 10/20/17  Yes Lars Pinks M, PA-C  ?atorvastatin (LIPITOR) 80 MG tablet Take 1 tablet (80 mg total) by mouth daily at 6 PM. ?Patient taking differently: Take 80 mg by mouth daily. 11/17/17  Yes Herminio Commons, MD  ?carvedilol (COREG) 25 MG tablet Take 1 tablet by mouth 2 (two) times daily. 01/14/20  Yes [provider]  ?cholecalciferol (VITAMIN D3) 25 MCG (1000 UNIT)  tablet Take 1,000 Units by mouth daily.   Yes [provider]  ?diclofenac Sodium ([employer] DICLOFENAC SODIUM) 1 % GEL Apply 2 g topically 4 (four) times daily.   Yes [provider]  ?ferrous sulfate 325 (65 FE) MG tablet Take 325 mg by mouth 2 (two) times daily.   Yes [provider]  ?fish oil-omega-3 fatty acids 1000 MG capsule Take 1 g by mouth 3 (three) times daily.    Yes [provider]  ?fluticasone-salmeterol (ADVAIR) 250-50 MCG/ACT AEPB Inhale 1 puff into the lungs in the morning and at bedtime. Advair 250/50 one puff 2 times/ day 03/30/21  Yes Chesley Mires, MD  ?folic acid (FOLVITE) 1 MG tablet TAKE 1 TABLET EVERY DAY 02/20/21  Yes Pennington, Rebekah M, PA-C  ?furosemide (LASIX) 20 MG tablet TAKE 1 TABLET EVERY OTHER DAY ALTERNATING WITH 2 TABLETS EVERY OTHER DAY  ( DOSE CHANGE ) ?Patient taking differently: Take 20 mg by mouth daily. 01/12/21  Yes BranchAlphonse Guild, MD  ?latanoprost (XALATAN) 0.005 % ophthalmic solution Place 1 drop into both eyes at bedtime. 02/10/19  Yes [provider]  ?lisinopril (ZESTRIL) 40 MG tablet TAKE 1 TABLET EVERY DAY ( DOSE INCREASE ) ?Patient taking differently: Take 40 mg by mouth daily. 08/23/20  Yes BranchAlphonse Guild, MD  ?magnesium oxide (MAG-OX) 400 MG tablet Take 1 tablet (400 mg total) by mouth 2 (two)  times daily. ?Patient taking differently: Take 400 mg by mouth daily. 10/10/17  Yes Dunn, Dayna N, PA-C  ?meclizine (ANTIVERT) 25 MG tablet Take 25 mg by mouth 3 (three) times daily as needed for dizziness.    Yes [provider]  ?Multiple Vitamins-Minerals (CENTRUM SILVER 50+WOMEN) TABS Take 1 tablet by mouth daily.    Yes [provider]  ?omeprazole (PRILOSEC) 40 MG capsule Take 40 mg by mouth daily.   Yes [provider]  ?potassium chloride SA (KLOR-CON) 20 MEQ tablet Take 1 tablet (20 mEq total) by mouth daily. TAKE 2 TABLETS DAILY FOR 3 DAYS 07/21/20 08/01/21 Yes Shahmehdi, Seyed A, MD   ?tiZANidine (ZANAFLEX) 4 MG tablet Take 4 mg by mouth at bedtime as needed. 11/14/20  Yes [provider]  ?traMADol (ULTRAM) 50 MG tablet Take 50 mg by mouth 3 (three) times daily as needed. 12/29/20  Yes [provider]  ?vitamin B-12 (CYANOCOBALAMIN) 500 MCG tablet Take 500 mcg by mouth daily.   Yes [provider]  ?   ? ?Allergies    ?Beta adrenergic blockers, Calcium channel blockers, and Naproxen   ? ?Review of Systems   ?Review of Systems  ?Constitutional:  Negative for fever.  ?HENT:  Negative for ear pain.   ?Eyes:  Negative for pain.  ?Respiratory:  Positive for shortness of breath. Negative for cough.   ?Cardiovascular:  Negative for chest pain.  ?Gastrointestinal:  Negative for abdominal pain.  ?Genitourinary:  Negative for flank pain.  ?Musculoskeletal:  Negative for back pain.  ?Skin:  Negative for rash.  ?Neurological:  Negative for headaches.  ? ?Physical Exam ?Updated Vital Signs ?BP (!) 134/59   Pulse 62   Temp 98.3 ?F (36.8 ?C) (Oral)   Resp (!) 29   SpO2 100%  ?Physical Exam ?Constitutional:   ?   General: She is not in acute distress. ?   Appearance: Normal appearance.  ?HENT:  ?   Head: Normocephalic.  ?   Nose: Nose normal.  ?Eyes:  ?   Extraocular Movements: Extraocular movements intact.  ?Cardiovascular:  ?   Rate and Rhythm: Normal rate.  ?Pulmonary:  ?   Effort: Pulmonary effort is normal.  ?Musculoskeletal:     ?   General: Normal range of motion.  ?   Cervical back: Normal range of motion.  ?   Right lower leg: Edema present.  ?   Left lower leg: Edema present.  ?   Comments: 1+ pitting edema bilateral lower extremity.  ?Neurological:  ?   General: No focal deficit present.  ?   Mental Status: She is alert. Mental status is at baseline.  ? ? ?ED Results / Procedures / Treatments   ?Labs ?(all labs ordered are listed, but only abnormal results are displayed) ?Labs Reviewed  ?BASIC METABOLIC PANEL - Abnormal; Notable for the following components:  ?    Result  Value  ? Glucose, Bld 131 (*)   ? Creatinine, Ser 1.05 (*)   ? Calcium 8.8 (*)   ? GFR, Estimated 55 (*)   ? All other components within normal limits  ?BRAIN NATRIURETIC PEPTIDE - Abnormal; Notable for the following components:  ? B Natriuretic Peptide 315.0 (*)   ? All other components within normal limits  ?CBC - Abnormal; Notable for the following components:  ? RBC 3.21 (*)   ? Hemoglobin 9.6 (*)   ? HCT 32.1 (*)   ? MCHC 29.9 (*)   ? RDW 15.9 (*)   ?  All other components within normal limits  ?TROPONIN I (HIGH SENSITIVITY)  ?TROPONIN I (HIGH SENSITIVITY)  ? ? ?EKG ?EKG Interpretation ? ?Date/Time:  Wednesday Aug 01 2021 18:46:46 EDT ?Ventricular Rate:  65 ?PR Interval:    ?QRS Duration: 84 ?QT Interval:  356 ?QTC Calculation: 370 ?R Axis:   66 ?Text Interpretation: Atrial fibrillation with premature ventricular or aberrantly conducted complexes Nonspecific ST and T wave abnormality Abnormal ECG When compared with ECG of 20-Jul-2020 21:18, PREVIOUS ECG IS PRESENT Confirmed by Thamas Jaegers ([id number]) on 08/01/2021 9:35:22 PM ? ?Radiology ?DG Chest 2 View ? ?Result Date: 08/01/2021 ?CLINICAL DATA:  Shortness of breath and weakness. EXAM: CHEST - 2 VIEW COMPARISON:  Chest radiograph 09/08/2020 FINDINGS: Hazy densities throughout both lungs are concerning for mild edema. Heart size is stable with post CABG changes. Chronic elevation of the right hemidiaphragm. Fluid or thickening along the lung fissures on the lateral view. Difficult to exclude small effusions. IMPRESSION: New hazy densities in both lungs. Findings are nonspecific but could represent mild interstitial edema. Electronically Signed   By: Markus Daft M.D.   On: 08/01/2021 19:04  ? ?CT Chest Wo Contrast ? ?Result Date: 08/01/2021 ?CLINICAL DATA:  Stage I right upper lobe lung adenocarcinoma status post right upper lobectomy 05/13/2019. Restaging. * Tracking Code: BO * EXAM: CT CHEST WITHOUT CONTRAST TECHNIQUE: Multidetector CT imaging of the chest was  performed following the standard protocol without IV contrast. RADIATION DOSE REDUCTION: This exam was performed according to the departmental dose-optimization program which includes automated exposure control, adjustment o

## 2021-08-06 DIAGNOSIS — E1143 Type 2 diabetes mellitus with diabetic autonomic (poly)neuropathy: Secondary | ICD-10-CM | POA: Diagnosis not present

## 2021-08-06 DIAGNOSIS — E7849 Other hyperlipidemia: Secondary | ICD-10-CM | POA: Diagnosis not present

## 2021-08-06 DIAGNOSIS — Z6831 Body mass index (BMI) 31.0-31.9, adult: Secondary | ICD-10-CM | POA: Diagnosis not present

## 2021-08-06 DIAGNOSIS — Z0000 Encounter for general adult medical examination without abnormal findings: Secondary | ICD-10-CM | POA: Diagnosis not present

## 2021-08-06 DIAGNOSIS — J449 Chronic obstructive pulmonary disease, unspecified: Secondary | ICD-10-CM | POA: Diagnosis not present

## 2021-08-06 DIAGNOSIS — I700 Atherosclerosis of aorta: Secondary | ICD-10-CM | POA: Diagnosis not present

## 2021-08-06 DIAGNOSIS — I10 Essential (primary) hypertension: Secondary | ICD-10-CM | POA: Diagnosis not present

## 2021-08-06 DIAGNOSIS — K219 Gastro-esophageal reflux disease without esophagitis: Secondary | ICD-10-CM | POA: Diagnosis not present

## 2021-08-06 DIAGNOSIS — F411 Generalized anxiety disorder: Secondary | ICD-10-CM | POA: Diagnosis not present

## 2021-08-06 DIAGNOSIS — I5022 Chronic systolic (congestive) heart failure: Secondary | ICD-10-CM | POA: Diagnosis not present

## 2021-08-07 ENCOUNTER — Inpatient Hospital Stay (HOSPITAL_COMMUNITY): Payer: PPO | Admitting: Hematology

## 2021-08-07 VITALS — BP 141/60 | HR 64 | Temp 97.8°F | Resp 18 | Ht 65.0 in | Wt 181.2 lb

## 2021-08-07 DIAGNOSIS — Z79899 Other long term (current) drug therapy: Secondary | ICD-10-CM | POA: Insufficient documentation

## 2021-08-07 DIAGNOSIS — Z85118 Personal history of other malignant neoplasm of bronchus and lung: Secondary | ICD-10-CM | POA: Diagnosis not present

## 2021-08-07 DIAGNOSIS — D649 Anemia, unspecified: Secondary | ICD-10-CM | POA: Diagnosis not present

## 2021-08-07 DIAGNOSIS — C3491 Malignant neoplasm of unspecified part of right bronchus or lung: Secondary | ICD-10-CM

## 2021-08-07 DIAGNOSIS — E538 Deficiency of other specified B group vitamins: Secondary | ICD-10-CM | POA: Insufficient documentation

## 2021-08-07 DIAGNOSIS — Z87891 Personal history of nicotine dependence: Secondary | ICD-10-CM | POA: Diagnosis not present

## 2021-08-07 NOTE — Progress Notes (Signed)
? ?[facility] ?[address] ?[place],  [address] ? ? ?CLINIC:  ?Medical Oncology/Hematology ? ?PCP:  ?Neale Burly, MD ?[place] / [place] [address] ?[phone] ? ? 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 ?PRIOR THERAPY: Right upper lobectomy on 05/13/2019 ? ?NGS Results: not done ? ?CURRENT THERAPY: Intermittent IV Iron last on 09/22/2020 ? ?BRIEF ONCOLOGIC HISTORY:  ?Oncology History  ?Non-small cell cancer of right lung ([place])  ?06/29/2019 Initial Diagnosis  ? Non-small cell cancer of right lung ([place]) ? ?  ?06/29/2019 Cancer Staging  ? Staging form: Lung, AJCC 8th Edition ?- Clinical stage from 06/29/2019: Stage IA2 (cT1b, cN0, cM0) - Signed by Derek Jack, MD on 06/29/2019 ? ?  ? ? ?CANCER STAGING: ? Cancer Staging  ?Non-small cell cancer of right lung ([place]) ?Staging form: Lung, AJCC 8th Edition ?- Clinical stage from 06/29/2019: Stage IA2 (cT1b, cN0, cM0) - Signed by Derek Jack, MD on 06/29/2019 ? ? ?INTERVAL HISTORY:  ?[name], a 76 y.o. female, returns for routine follow-up of her stage I right lung adenocarcinoma and IDA. Mahari was last seen on 05/02/2021.  ? ?Today she reports feeling good. She denies current bleeding, and she denies SOB. She denies cardiac issues and recent pneumonia.  ? ?REVIEW OF SYSTEMS:  ?Review of Systems  ?Constitutional:  Negative for appetite change and fatigue.  ?HENT:   Negative for nosebleeds.   ?Respiratory:  Positive for cough. Negative for hemoptysis and shortness of breath.   ?Gastrointestinal:  Negative for blood in stool.  ?Genitourinary:  Negative for hematuria.   ?Neurological:  Positive for dizziness.  ?Hematological:  Does not bruise/bleed easily.  ?All other systems reviewed and are negative. ? ?PAST MEDICAL/SURGICAL HISTORY:  ?Past Medical History:  ?Diagnosis Date  ? Anxiety neurosis   ? Arthritis   ? Asthma   ? CAD (coronary artery disease)   ? a. 08/2017: abnormal nuc-> sent  directly to [facility], NSTEMI, s/p CABG (left internal mammary artery to left anterior descending, sequential saphenous vein graft to ramus intermedius branches 1 and 2).  ? Carotid artery disease ([place])   ? Chronic anemia   ? Dizziness   ? GERD (gastroesophageal reflux disease)   ? Glaucoma   ? Heart disease   ? Hiatal hernia   ? Hyperlipemia   ? Hypertension   ? Ischemic cardiomyopathy   ? Joint pain   ? Mitral regurgitation   ? Postoperative atrial fibrillation ([place]) 08/2017  ? Pulmonary embolus ([place])   ? Type 2 diabetes mellitus ([place])   ? ?Past Surgical History:  ?Procedure Laterality Date  ? BACK SURGERY  2016  ? CORONARY ARTERY BYPASS GRAFT N/A 09/18/2017  ? Procedure: CORONARY ARTERY BYPASS GRAFTING (CABG);  Surgeon: [name], MD;  Location: [facility];  Service: Open Heart Surgery;  Laterality: N/A;  Saphenous vein harvest. ?LIMA to LAD ?Saphenous vein (sequential) ramus 1 & 2  ? EYE SURGERY Bilateral   ? "surgery for glaucoma"  ? HERNIA REPAIR    ? INTERCOSTAL NERVE BLOCK Right 05/13/2019  ? Procedure: Intercostal Nerve Block;  Surgeon: [name], MD;  Location: [facility];  Service: Thoracic;  Laterality: Right;  ? LEFT HEART CATH AND CORONARY ANGIOGRAPHY N/A 09/15/2017  ? Procedure: LEFT HEART CATH AND CORONARY ANGIOGRAPHY;  Surgeon: Lorretta Harp, MD;  Location: [facility];  Service: Cardiovascular;  Laterality: N/A;  ? LYMPH NODE DISSECTION  Right 05/13/2019  ? Procedure: Lymph Node Dissection;  Surgeon: [name], MD;  Location: [facility];  Service: Thoracic;  Laterality: Right;  ? ROTATOR CUFF REPAIR Left   ? x2  ? SHOULDER SURGERY    ? LEFT  ? TEE WITHOUT CARDIOVERSION N/A 09/18/2017  ? Procedure: TRANSESOPHAGEAL ECHOCARDIOGRAM (TEE);  Surgeon: [name], MD;  Location: [facility];  Service: Open Heart Surgery;  Laterality: N/A;  ? TOTAL KNEE ARTHROPLASTY    ? LEFT  ? VAGINAL HYSTERECTOMY    ? partial  ? ? ?SOCIAL HISTORY:  ?Social History  ? ?Socioeconomic History  ?  Marital status: Married  ?  Spouse name: [name]  ? Number of children: 5  ? Years of education: Not on file  ? Highest education level: Not on file  ?Occupational History  ? Not on file  ?Tobacco Use  ? Smoking status: Former  ?  Packs/day: 0.50  ?  Years: 35.00  ?  Pack years: 17.50  ?  Types: Cigarettes  ?  Quit date: 03/25/1978  ?  Years since quitting: 43.4  ? Smokeless tobacco: Never  ? Tobacco comments:  ?  quit more than 30 years ago  ?Vaping Use  ? Vaping Use: Never used  ?Substance and Sexual Activity  ? Alcohol use: No  ? Drug use: No  ? Sexual activity: Not on file  ?Other Topics Concern  ? Not on file  ?Social History Narrative  ? Not on file  ? ?Social Determinants of Health  ? ?Financial Resource Strain: Not on file  ?Food Insecurity: Not on file  ?Transportation Needs: Not on file  ?Physical Activity: Not on file  ?Stress: Not on file  ?Social Connections: Not on file  ?Intimate Partner Violence: Not on file  ? ? ?FAMILY HISTORY:  ?Family History  ?Problem Relation Age of Onset  ? Diabetes Father   ? Heart disease Father   ? Breast cancer Mother   ? Congestive Heart Failure Brother   ? Anemia Daughter   ? Seizures Daughter   ? Migraines Daughter   ? Diabetes Daughter   ? Hypertension Daughter   ? Bladder Cancer Son   ? Congestive Heart Failure Son   ? Congestive Heart Failure Son   ? ? ?CURRENT MEDICATIONS:  ?Current Outpatient Medications  ?Medication Sig Dispense Refill  ? acetaminophen (TYLENOL) 500 MG tablet Take 1,000 mg by mouth every 6 (six) hours as needed for moderate pain.    ? albuterol (VENTOLIN HFA) 108 (90 Base) MCG/ACT inhaler Inhale 2 puffs into the lungs every 6 (six) hours as needed. 18 g 11  ? ALPRAZolam (XANAX) 0.5 MG tablet Take 0.5 mg by mouth 2 (two) times daily.    ? amLODipine (NORVASC) 10 MG tablet Take 10 mg by mouth daily.    ? aspirin 81 MG tablet Take 1 tablet (81 mg total) by mouth daily.    ? atorvastatin (LIPITOR) 80 MG tablet Take 1 tablet (80 mg total) by mouth daily at  6 PM. (Patient taking differently: Take 80 mg by mouth daily.) 90 tablet 3  ? carvedilol (COREG) 25 MG tablet Take 1 tablet by mouth 2 (two) times daily.    ? cephALEXin (KEFLEX) 500 MG capsule Take 500 mg by mouth 2 (two) times daily.    ? cholecalciferol (VITAMIN D3) 25 MCG (1000 UNIT) tablet Take 1,000 Units by mouth daily.    ? diclofenac Sodium (VOLTAREN) 1 % GEL Apply 2 g topically 4 (  four) times daily.    ? ferrous sulfate 325 (65 FE) MG tablet Take 325 mg by mouth 2 (two) times daily.    ? fish oil-omega-3 fatty acids 1000 MG capsule Take 1 g by mouth 3 (three) times daily.     ? fluticasone-salmeterol (ADVAIR) 250-50 MCG/ACT AEPB Inhale 1 puff into the lungs in the morning and at bedtime. Advair 250/50 one puff 2 times/ day 60 each 11  ? folic acid (FOLVITE) 1 MG tablet TAKE 1 TABLET EVERY DAY 90 tablet 3  ? furosemide (LASIX) 20 MG tablet TAKE 1 TABLET EVERY OTHER DAY ALTERNATING WITH 2 TABLETS EVERY OTHER DAY  ( DOSE CHANGE ) (Patient taking differently: Take 20 mg by mouth daily.) 135 tablet 1  ? latanoprost (XALATAN) 0.005 % ophthalmic solution Place 1 drop into both eyes at bedtime.    ? lisinopril (ZESTRIL) 40 MG tablet TAKE 1 TABLET EVERY DAY ( DOSE INCREASE ) (Patient taking differently: Take 40 mg by mouth daily.) 90 tablet 3  ? magnesium oxide (MAG-OX) 400 MG tablet Take 1 tablet (400 mg total) by mouth 2 (two) times daily. (Patient taking differently: Take 400 mg by mouth daily.) 180 tablet 3  ? meclizine (ANTIVERT) 25 MG tablet Take 25 mg by mouth 3 (three) times daily as needed for dizziness.     ? Multiple Vitamins-Minerals (CENTRUM SILVER 50+WOMEN) TABS Take 1 tablet by mouth daily.     ? omeprazole (PRILOSEC) 40 MG capsule Take 40 mg by mouth daily.    ? tiZANidine (ZANAFLEX) 4 MG tablet Take 4 mg by mouth at bedtime as needed.    ? traMADol (ULTRAM) 50 MG tablet Take 50 mg by mouth 3 (three) times daily as needed.    ? vitamin B-12 (CYANOCOBALAMIN) 500 MCG tablet Take 500 mcg by mouth  daily.    ? potassium chloride SA (KLOR-CON) 20 MEQ tablet Take 1 tablet (20 mEq total) by mouth daily. TAKE 2 TABLETS DAILY FOR 3 DAYS 30 tablet 1  ? ?No current facility-administered medications for this vis

## 2021-08-09 ENCOUNTER — Inpatient Hospital Stay (HOSPITAL_COMMUNITY): Payer: PPO

## 2021-08-09 VITALS — BP 136/64 | HR 63 | Temp 97.0°F | Resp 18

## 2021-08-09 DIAGNOSIS — D649 Anemia, unspecified: Secondary | ICD-10-CM

## 2021-08-09 MED ORDER — SODIUM CHLORIDE 0.9 % IV SOLN
300.0000 mg | Freq: Once | INTRAVENOUS | Status: AC
  Administered 2021-08-09: 300 mg via INTRAVENOUS
  Filled 2021-08-09: qty 300

## 2021-08-09 MED ORDER — SODIUM CHLORIDE 0.9 % IV SOLN: Freq: Once | INTRAVENOUS | Status: AC

## 2021-08-09 MED ORDER — LORATADINE 10 MG PO TABS
10.0000 mg | ORAL_TABLET | Freq: Once | ORAL | Status: AC
  Administered 2021-08-09: 10 mg via ORAL
  Filled 2021-08-09: qty 1

## 2021-08-09 MED ORDER — ACETAMINOPHEN 325 MG PO TABS
650.0000 mg | ORAL_TABLET | Freq: Once | ORAL | Status: AC
  Administered 2021-08-09: 650 mg via ORAL
  Filled 2021-08-09: qty 2

## 2021-08-09 NOTE — Patient Instructions (Signed)
[facility]  Discharge Instructions: Thank you for choosing [facility] to provide your oncology and hematology care.  If you have a lab appointment with the Cancer Center, please come in thru the Main Entrance and check in at the main information desk.  Wear comfortable clothing and clothing appropriate for easy access to any Portacath or PICC line.   We strive to give you quality time with your provider. You may need to reschedule your appointment if you arrive late (15 or more minutes).  Arriving late affects you and other patients whose appointments are after yours.  Also, if you miss three or more appointments without notifying the office, you may be dismissed from the clinic at the provider's discretion.      For prescription refill requests, have your pharmacy contact our office and allow 72 hours for refills to be completed.    Today you received the following chemotherapy and/or immunotherapy agents Venofer      To help prevent nausea and vomiting after your treatment, we encourage you to take your nausea medication as directed.  BELOW ARE SYMPTOMS THAT SHOULD BE REPORTED IMMEDIATELY: *FEVER GREATER THAN 100.4 F (38 C) OR HIGHER *CHILLS OR SWEATING *NAUSEA AND VOMITING THAT IS NOT CONTROLLED WITH YOUR NAUSEA MEDICATION *UNUSUAL SHORTNESS OF BREATH *UNUSUAL BRUISING OR BLEEDING *URINARY PROBLEMS (pain or burning when urinating, or frequent urination) *BOWEL PROBLEMS (unusual diarrhea, constipation, pain near the anus) TENDERNESS IN MOUTH AND THROAT WITH OR WITHOUT PRESENCE OF ULCERS (sore throat, sores in mouth, or a toothache) UNUSUAL RASH, SWELLING OR PAIN  UNUSUAL VAGINAL DISCHARGE OR ITCHING   Items with * indicate a potential emergency and should be followed up as soon as possible or go to the Emergency Department if any problems should occur.  Please show the CHEMOTHERAPY ALERT CARD or IMMUNOTHERAPY ALERT CARD at check-in to the Emergency  Department and triage nurse.  Should you have questions after your visit or need to cancel or reschedule your appointment, please contact [facility] [phone]  and follow the prompts.  Office hours are 8:00 a.m. to 4:30 p.m. Monday - Friday. Please note that voicemails left after 4:00 p.m. may not be returned until the following business day.  We are closed weekends and major holidays. You have access to a nurse at all times for urgent questions. Please call the main number to the clinic [phone] and follow the prompts.  For any non-urgent questions, you may also contact your provider using MyChart. We now offer e-Visits for anyone 18 and older to request care online for non-urgent symptoms. For details visit [email or web address].   Also download the MyChart app! Go to the app store, search "MyChart", open the app, select [facility], and log in with your MyChart username and password.  Due to Covid, a mask is required upon entering the hospital/clinic. If you do not have a mask, one will be given to you upon arrival. For doctor visits, patients may have 1 support person aged 18 or older with them. For treatment visits, patients cannot have anyone with them due to current Covid guidelines and our immunocompromised population.  

## 2021-08-09 NOTE — Progress Notes (Signed)
Patient presents today for Venofer infusion per providers order.  Vital signs WNL.  Patient has no new complaints at this time.    Peripheral IV started and blood return noted pre and post infusion.  Venofer infusion given today per MD orders.  Stable during infusion without adverse affects.  Vital signs stable.  No complaints at this time.  Discharge from clinic via wheelchair in stable condition.  Alert and oriented X 3.  Follow up with [facility] as scheduled.

## 2021-08-17 ENCOUNTER — Inpatient Hospital Stay (HOSPITAL_COMMUNITY): Payer: PPO

## 2021-08-17 VITALS — BP 146/56 | HR 64 | Temp 97.6°F | Resp 18

## 2021-08-17 DIAGNOSIS — D649 Anemia, unspecified: Secondary | ICD-10-CM

## 2021-08-17 MED ORDER — SODIUM CHLORIDE 0.9 % IV SOLN: Freq: Once | INTRAVENOUS | Status: AC

## 2021-08-17 MED ORDER — LORATADINE 10 MG PO TABS
10.0000 mg | ORAL_TABLET | Freq: Once | ORAL | Status: AC
  Administered 2021-08-17: 10 mg via ORAL
  Filled 2021-08-17: qty 1

## 2021-08-17 MED ORDER — ACETAMINOPHEN 325 MG PO TABS
650.0000 mg | ORAL_TABLET | Freq: Once | ORAL | Status: AC
  Administered 2021-08-17: 650 mg via ORAL
  Filled 2021-08-17: qty 2

## 2021-08-17 MED ORDER — SODIUM CHLORIDE 0.9 % IV SOLN
300.0000 mg | Freq: Once | INTRAVENOUS | Status: AC
  Administered 2021-08-17: 300 mg via INTRAVENOUS
  Filled 2021-08-17: qty 300

## 2021-08-17 NOTE — Progress Notes (Signed)
Pt presents today for Venofer IV iron infusion per provider's order. Vital signs stable and pt voiced no new complaints at this time.  ? ?Peripheral IV started with good blood return pre and post infusion. ? ?Venofer 300 mg given today per MD orders. Tolerated infusion without adverse affects. Vital signs stable. No complaints at this time. Discharged from clinic via wheelchair in stable condition. Alert and oriented x 3. F/U with [facility] as scheduled.   ?

## 2021-08-17 NOTE — Patient Instructions (Signed)
[facility]  Discharge Instructions: Thank you for choosing [facility] to provide your oncology and hematology care.  If you have a lab appointment with the [facility], please come in thru the Main Entrance and check in at the main information desk.  Wear comfortable clothing and clothing appropriate for easy access to any Portacath or PICC line.   We strive to give you quality time with your provider. You may need to reschedule your appointment if you arrive late (15 or more minutes).  Arriving late affects you and other patients whose appointments are after yours.  Also, if you miss three or more appointments without notifying the office, you may be dismissed from the clinic at the provider's discretion.      For prescription refill requests, have your pharmacy contact our office and allow 72 hours for refills to be completed.    Today you received Venofer IV iron.   BELOW ARE SYMPTOMS THAT SHOULD BE REPORTED IMMEDIATELY: *FEVER GREATER THAN 100.4 F (38 C) OR HIGHER *CHILLS OR SWEATING *NAUSEA AND VOMITING THAT IS NOT CONTROLLED WITH YOUR NAUSEA MEDICATION *UNUSUAL SHORTNESS OF BREATH *UNUSUAL BRUISING OR BLEEDING *URINARY PROBLEMS (pain or burning when urinating, or frequent urination) *BOWEL PROBLEMS (unusual diarrhea, constipation, pain near the anus) TENDERNESS IN MOUTH AND THROAT WITH OR WITHOUT PRESENCE OF ULCERS (sore throat, sores in mouth, or a toothache) UNUSUAL RASH, SWELLING OR PAIN  UNUSUAL VAGINAL DISCHARGE OR ITCHING   Items with * indicate a potential emergency and should be followed up as soon as possible or go to the Emergency Department if any problems should occur.  Please show the CHEMOTHERAPY ALERT CARD or IMMUNOTHERAPY ALERT CARD at check-in to the Emergency Department and triage nurse.  Should you have questions after your visit or need to cancel or reschedule your appointment, please contact [facility] [phone]   and follow the prompts.  Office hours are 8:00 a.m. to 4:30 p.m. Monday - Friday. Please note that voicemails left after 4:00 p.m. may not be returned until the following business day.  We are closed weekends and major holidays. You have access to a nurse at all times for urgent questions. Please call the main number to the clinic [phone] and follow the prompts.  For any non-urgent questions, you may also contact your provider using MyChart. We now offer e-Visits for anyone 73 and older to request care online for non-urgent symptoms. For details visit [email or web address].   Also download the MyChart app! Go to the app store, search "MyChart", open the app, select [facility], and log in with your MyChart username and password.  Due to Covid, a mask is required upon entering the hospital/clinic. If you do not have a mask, one will be given to you upon arrival. For doctor visits, patients may have 1 support person aged 41 or older with them. For treatment visits, patients cannot have anyone with them due to current Covid guidelines and our immunocompromised population.

## 2021-08-24 ENCOUNTER — Inpatient Hospital Stay (HOSPITAL_COMMUNITY): Payer: PPO | Attending: Hematology

## 2021-08-24 ENCOUNTER — Encounter (HOSPITAL_COMMUNITY): Payer: Self-pay

## 2021-08-24 VITALS — BP 140/63 | HR 60 | Temp 97.5°F | Resp 18

## 2021-08-24 DIAGNOSIS — E538 Deficiency of other specified B group vitamins: Secondary | ICD-10-CM | POA: Insufficient documentation

## 2021-08-24 DIAGNOSIS — D649 Anemia, unspecified: Secondary | ICD-10-CM | POA: Diagnosis not present

## 2021-08-24 DIAGNOSIS — Z85118 Personal history of other malignant neoplasm of bronchus and lung: Secondary | ICD-10-CM | POA: Diagnosis not present

## 2021-08-24 MED ORDER — SODIUM CHLORIDE 0.9 % IV SOLN
400.0000 mg | Freq: Once | INTRAVENOUS | Status: AC
  Administered 2021-08-24: 400 mg via INTRAVENOUS
  Filled 2021-08-24: qty 20

## 2021-08-24 MED ORDER — SODIUM CHLORIDE 0.9 % IV SOLN: Freq: Once | INTRAVENOUS | Status: AC

## 2021-08-24 MED ORDER — LORATADINE 10 MG PO TABS
10.0000 mg | ORAL_TABLET | Freq: Once | ORAL | Status: AC
  Administered 2021-08-24: 10 mg via ORAL
  Filled 2021-08-24: qty 1

## 2021-08-24 MED ORDER — ACETAMINOPHEN 325 MG PO TABS
650.0000 mg | ORAL_TABLET | Freq: Once | ORAL | Status: AC
  Administered 2021-08-24: 650 mg via ORAL
  Filled 2021-08-24: qty 2

## 2021-08-24 NOTE — Progress Notes (Signed)
Patient tolerated iron infusion with no complaints voiced.  Peripheral IV site clean and dry with good blood return noted before and after infusion.  Band aid applied.  VSS with discharge and left in satisfactory condition with no s/s of distress noted.   

## 2021-08-24 NOTE — Patient Instructions (Signed)
[facility]  Discharge Instructions: Thank you for choosing [facility] to provide your oncology and hematology care.  If you have a lab appointment with the [facility], please come in thru the Main Entrance and check in at the main information desk.  Wear comfortable clothing and clothing appropriate for easy access to any Portacath or PICC line.   We strive to give you quality time with your provider. You may need to reschedule your appointment if you arrive late (15 or more minutes).  Arriving late affects you and other patients whose appointments are after yours.  Also, if you miss three or more appointments without notifying the office, you may be dismissed from the clinic at the provider's discretion.      For prescription refill requests, have your pharmacy contact our office and allow 72 hours for refills to be completed.    Today you received the following venofer Iron Sucrose Injection What is this medication? IRON SUCROSE (EYE ern SOO krose) treats low levels of iron (iron deficiency anemia) in people with kidney disease. Iron is a mineral that plays an important role in making red blood cells, which carry oxygen from your lungs to the rest of your body. This medicine may be used for other purposes; ask your health care provider or pharmacist if you have questions. COMMON BRAND NAME(S): Venofer What should I tell my care team before I take this medication? They need to know if you have any of these conditions: Anemia not caused by low iron levels Heart disease High levels of iron in the blood Kidney disease Liver disease An unusual or allergic reaction to iron, other medications, foods, dyes, or preservatives Pregnant or trying to get pregnant Breast-feeding How should I use this medication? This medication is for infusion into a vein. It is given in a hospital or clinic setting. Talk to your care team about the use of this medication in children.  While this medication may be prescribed for children as young as 2 years for selected conditions, precautions do apply. Overdosage: If you think you have taken too much of this medicine contact a poison control center or emergency room at once. NOTE: This medicine is only for you. Do not share this medicine with others. What if I miss a dose? It is important not to miss your dose. Call your care team if you are unable to keep an appointment. What may interact with this medication? Do not take this medication with any of the following: Deferoxamine Dimercaprol Other iron products This medication may also interact with the following: Chloramphenicol Deferasirox This list may not describe all possible interactions. Give your health care provider a list of all the medicines, herbs, non-prescription drugs, or dietary supplements you use. Also tell them if you smoke, drink alcohol, or use illegal drugs. Some items may interact with your medicine. What should I watch for while using this medication? Visit your care team regularly. Tell your care team if your symptoms do not start to get better or if they get worse. You may need blood work done while you are taking this medication. You may need to follow a special diet. Talk to your care team. Foods that contain iron include: whole grains/cereals, dried fruits, beans, or peas, leafy green vegetables, and organ meats (liver, kidney). What side effects may I notice from receiving this medication? Side effects that you should report to your care team as soon as possible: Allergic reactions--skin rash, itching, hives,  swelling of the face, lips, tongue, or throat Low blood pressure--dizziness, feeling faint or lightheaded, blurry vision Shortness of breath Side effects that usually do not require medical attention (report to your care team if they continue or are bothersome): Flushing Headache Joint pain Muscle pain Nausea Pain, redness, or  irritation at injection site This list may not describe all possible side effects. Call your doctor for medical advice about side effects. You may report side effects to FDA at 1-800-FDA-1088. Where should I keep my medication? This medication is given in a hospital or clinic and will not be stored at home. NOTE: This sheet is a summary. It may not cover all possible information. If you have questions about this medicine, talk to your doctor, pharmacist, or health care provider.  2023 Elsevier/Gold Standard (2020-08-04 00:00:00)       To help prevent nausea and vomiting after your treatment, we encourage you to take your nausea medication as directed.  BELOW ARE SYMPTOMS THAT SHOULD BE REPORTED IMMEDIATELY: *FEVER GREATER THAN 100.4 F (38 C) OR HIGHER *CHILLS OR SWEATING *NAUSEA AND VOMITING THAT IS NOT CONTROLLED WITH YOUR NAUSEA MEDICATION *UNUSUAL SHORTNESS OF BREATH *UNUSUAL BRUISING OR BLEEDING *URINARY PROBLEMS (pain or burning when urinating, or frequent urination) *BOWEL PROBLEMS (unusual diarrhea, constipation, pain near the anus) TENDERNESS IN MOUTH AND THROAT WITH OR WITHOUT PRESENCE OF ULCERS (sore throat, sores in mouth, or a toothache) UNUSUAL RASH, SWELLING OR PAIN  UNUSUAL VAGINAL DISCHARGE OR ITCHING   Items with * indicate a potential emergency and should be followed up as soon as possible or go to the Emergency Department if any problems should occur.  Please show the CHEMOTHERAPY ALERT CARD or IMMUNOTHERAPY ALERT CARD at check-in to the Emergency Department and triage nurse.  Should you have questions after your visit or need to cancel or reschedule your appointment, please contact [facility] [phone]  and follow the prompts.  Office hours are 8:00 a.m. to 4:30 p.m. Monday - Friday. Please note that voicemails left after 4:00 p.m. may not be returned until the following business day.  We are closed weekends and major holidays. You have access to a  nurse at all times for urgent questions. Please call the main number to the clinic [phone] and follow the prompts.  For any non-urgent questions, you may also contact your provider using MyChart. We now offer e-Visits for anyone 50 and older to request care online for non-urgent symptoms. For details visit [email or web address].   Also download the MyChart app! Go to the app store, search "MyChart", open the app, select [facility], and log in with your MyChart username and password.  Due to Covid, a mask is required upon entering the hospital/clinic. If you do not have a mask, one will be given to you upon arrival. For doctor visits, patients may have 1 support person aged 13 or older with them. For treatment visits, patients cannot have anyone with them due to current Covid guidelines and our immunocompromised population.

## 2021-08-30 ENCOUNTER — Ambulatory Visit: Payer: PPO | Admitting: Cardiology

## 2021-08-30 ENCOUNTER — Encounter: Payer: Self-pay | Admitting: Cardiology

## 2021-08-30 ENCOUNTER — Telehealth: Payer: Self-pay | Admitting: Cardiology

## 2021-08-30 VITALS — BP 130/70 | HR 65 | Ht 65.0 in | Wt 181.8 lb

## 2021-08-30 DIAGNOSIS — I10 Essential (primary) hypertension: Secondary | ICD-10-CM | POA: Diagnosis not present

## 2021-08-30 DIAGNOSIS — Z79899 Other long term (current) drug therapy: Secondary | ICD-10-CM

## 2021-08-30 DIAGNOSIS — I2510 Atherosclerotic heart disease of native coronary artery without angina pectoris: Secondary | ICD-10-CM | POA: Diagnosis not present

## 2021-08-30 DIAGNOSIS — I6523 Occlusion and stenosis of bilateral carotid arteries: Secondary | ICD-10-CM

## 2021-08-30 DIAGNOSIS — I5033 Acute on chronic diastolic (congestive) heart failure: Secondary | ICD-10-CM

## 2021-08-30 MED ORDER — FUROSEMIDE 20 MG PO TABS: 40.0000 mg | ORAL_TABLET | Freq: Every morning | ORAL | 4 refills | Status: DC

## 2021-08-30 MED ORDER — POTASSIUM CHLORIDE CRYS ER 20 MEQ PO TBCR: 40.0000 meq | EXTENDED_RELEASE_TABLET | Freq: Every day | ORAL | 4 refills | Status: DC

## 2021-08-30 NOTE — Telephone Encounter (Signed)
Patient calling to see if she can start having her teeth pull. Please advise

## 2021-08-30 NOTE — Patient Instructions (Addendum)
Medication Instructions:  Your physician has recommended you make the following change in your medication:  Increase furosemide to 40 mg in the early morning and 20 mg in the late afternoon Increase potassium to 40 meq daily Continue other medications the same  Labwork: BMET, BNP & MG in 2 weeks around 09/13/2021 Non-fasting [facility]-no appointment needed-8:00 am to 4:00 pm (except 11 am-1:30 pm)  Testing/Procedures: none  Follow-Up: Your physician recommends that you schedule a follow-up appointment in: 4 months  Any Other Special Instructions Will Be Listed Below (If Applicable).  If you need a refill on your cardiac medications before your next appointment, please call your pharmacy.

## 2021-08-30 NOTE — Progress Notes (Signed)
Clinical Summary Ms. Towell is a 76 y.o.female seen today for follow up of the following medical problems.    1. CAD - prior CABG in 08/2017 (left internal mammary artery to left anterior descending, sequential saphenous vein graft to ramus intermedius branches 1 and 2). She presented with an NSTEMI -  10/2018 echo LVEF 60-65%     - no chest pains - compliant with meds         2. Carotid stenosis - 6/20219 carotid US: RICA 9-98%, LICA 33-82%  50/5397 RICA 6-73%, LICA 41-93% [id number] RICA 9-73%, LICA 53-29% - 11/2424 carotid US RICA 8-34%, LICA 19-62%   3. Hyperlipidemia - compliant with meds 4. HTN -compliant with meds    5. Mitral regurgitation - 10/2018 echo moderate MR - 06/2020 echo mod MR, mod TR   6. Chronic diastolic HF - 04/2977 admission with SOB, thought to be volume overloaded and diuresed - 06/2020 echo LVEF 60-65%, grade II dd, mild RV dysfunction, PASP 61 mmHg    08/01/21 ER visit mild pulm edema. BNP 315 - taking lasix 20mg  bid. No LE edema, no SOB/DOE - home weight 181 lbs, had been 175 lbs about 2 months ago.     7. Pulmonary HTN - 06/2020 echo LVEF 60-65%, grade II dd, mild RV dysfunction, PASP 61 mmHg     8. Lung cancer - history of stage Ia nonsmall cell lung CA - prior RUL lobectomy   9. History of PE   10. COPD - 04/2019 PFTs moderate obstruction, severe restriction, moderate diffusion defect - followed by Dr Halford Chessman pulmonary    Past Medical History:  Diagnosis Date   Anxiety neurosis    Arthritis    Asthma    CAD (coronary artery disease)    a. 08/2017: abnormal nuc-> sent directly to [facility], NSTEMI, s/p CABG (left internal mammary artery to left anterior descending, sequential saphenous vein graft to ramus intermedius branches 1 and 2).   Carotid artery disease (HCC)    Chronic anemia    Dizziness    GERD (gastroesophageal reflux disease)    Glaucoma    Heart disease    Hiatal hernia    Hyperlipemia    Hypertension    Ischemic  cardiomyopathy    Joint pain    Mitral regurgitation    Postoperative atrial fibrillation ([place]) 08/2017   Pulmonary embolus (HCC)    Type 2 diabetes mellitus (HCC)      Allergies  Allergen Reactions   Beta Adrenergic Blockers Other (See Comments)    Dropped heart rate to the 40's   Calcium Channel Blockers Other (See Comments)    Dropped HR into the 40's   Naproxen Hives     Current Outpatient Medications  Medication Sig Dispense Refill   acetaminophen (TYLENOL) 500 MG tablet Take 1,000 mg by mouth every 6 (six) hours as needed for moderate pain.     albuterol (VENTOLIN HFA) 108 (90 Base) MCG/ACT inhaler Inhale 2 puffs into the lungs every 6 (six) hours as needed. 18 g 11   ALPRAZolam (XANAX) 0.5 MG tablet Take 0.5 mg by mouth 2 (two) times daily.     amLODipine (NORVASC) 10 MG tablet Take 10 mg by mouth daily.     aspirin 81 MG tablet Take 1 tablet (81 mg total) by mouth daily.     atorvastatin (LIPITOR) 80 MG tablet Take 1 tablet (80 mg total) by mouth daily at 6 PM. (Patient taking differently: Take 80 mg by mouth  daily.) 90 tablet 3   carvedilol (COREG) 25 MG tablet Take 1 tablet by mouth 2 (two) times daily.     cephALEXin (KEFLEX) 500 MG capsule Take 500 mg by mouth 2 (two) times daily.     cholecalciferol (VITAMIN D3) 25 MCG (1000 UNIT) tablet Take 1,000 Units by mouth daily.     diclofenac Sodium (VOLTAREN) 1 % GEL Apply 2 g topically 4 (four) times daily.     ferrous sulfate 325 (65 FE) MG tablet Take 325 mg by mouth 2 (two) times daily.     fish oil-omega-3 fatty acids 1000 MG capsule Take 1 g by mouth 3 (three) times daily.      fluticasone-salmeterol (ADVAIR) 250-50 MCG/ACT AEPB Inhale 1 puff into the lungs in the morning and at bedtime. Advair 250/50 one puff 2 times/ day 60 each 11   folic acid (FOLVITE) 1 MG tablet TAKE 1 TABLET EVERY DAY 90 tablet 3   furosemide (LASIX) 20 MG tablet TAKE 1 TABLET EVERY OTHER DAY ALTERNATING WITH 2 TABLETS EVERY OTHER DAY  ( DOSE  CHANGE ) (Patient taking differently: Take 20 mg by mouth daily.) 135 tablet 1   latanoprost (XALATAN) 0.005 % ophthalmic solution Place 1 drop into both eyes at bedtime.     lisinopril (ZESTRIL) 40 MG tablet TAKE 1 TABLET EVERY DAY ( DOSE INCREASE ) (Patient taking differently: Take 40 mg by mouth daily.) 90 tablet 3   magnesium oxide (MAG-OX) 400 MG tablet Take 1 tablet (400 mg total) by mouth 2 (two) times daily. (Patient taking differently: Take 400 mg by mouth daily.) 180 tablet 3   meclizine (ANTIVERT) 25 MG tablet Take 25 mg by mouth 3 (three) times daily as needed for dizziness.      Multiple Vitamins-Minerals (CENTRUM SILVER 50+WOMEN) TABS Take 1 tablet by mouth daily.      omeprazole (PRILOSEC) 40 MG capsule Take 40 mg by mouth daily.     potassium chloride SA (KLOR-CON) 20 MEQ tablet Take 1 tablet (20 mEq total) by mouth daily. TAKE 2 TABLETS DAILY FOR 3 DAYS 30 tablet 1   tiZANidine (ZANAFLEX) 4 MG tablet Take 4 mg by mouth at bedtime as needed.     traMADol (ULTRAM) 50 MG tablet Take 50 mg by mouth 3 (three) times daily as needed.     vitamin B-12 (CYANOCOBALAMIN) 500 MCG tablet Take 500 mcg by mouth daily.     No current facility-administered medications for this visit.     Past Surgical History:  Procedure Laterality Date   BACK SURGERY  2016   CORONARY ARTERY BYPASS GRAFT N/A 09/18/2017   Procedure: CORONARY ARTERY BYPASS GRAFTING (CABG);  Surgeon: [name], MD;  Location: [facility];  Service: Open Heart Surgery;  Laterality: N/A;  Saphenous vein harvest. LIMA to LAD Saphenous vein (sequential) ramus 1 & 2   EYE SURGERY Bilateral    "surgery for glaucoma"   HERNIA REPAIR     INTERCOSTAL NERVE BLOCK Right 05/13/2019   Procedure: Intercostal Nerve Block;  Surgeon: [name], MD;  Location: [facility];  Service: Thoracic;  Laterality: Right;   LEFT HEART CATH AND CORONARY ANGIOGRAPHY N/A 09/15/2017   Procedure: LEFT HEART CATH AND CORONARY ANGIOGRAPHY;  Surgeon:  Lorretta Harp, MD;  Location: [facility];  Service: Cardiovascular;  Laterality: N/A;   LYMPH NODE DISSECTION Right 05/13/2019   Procedure: Lymph Node Dissection;  Surgeon: [name], MD;  Location: [place] [facility];  Service: Thoracic;  Laterality: Right;   ROTATOR CUFF REPAIR Left    x2   SHOULDER SURGERY     LEFT   TEE WITHOUT CARDIOVERSION N/A 09/18/2017   Procedure: TRANSESOPHAGEAL ECHOCARDIOGRAM (TEE);  Surgeon: [name], MD;  Location: [facility];  Service: Open Heart Surgery;  Laterality: N/A;   TOTAL KNEE ARTHROPLASTY     LEFT   VAGINAL HYSTERECTOMY     partial     Allergies  Allergen Reactions   Beta Adrenergic Blockers Other (See Comments)    Dropped heart rate to the 40's   Calcium Channel Blockers Other (See Comments)    Dropped HR into the 40's   Naproxen Hives      Family History  Problem Relation Age of Onset   Diabetes Father    Heart disease Father    Breast cancer Mother    Congestive Heart Failure Brother    Anemia Daughter    Seizures Daughter    Migraines Daughter    Diabetes Daughter    Hypertension Daughter    Bladder Cancer Son    Congestive Heart Failure Son    Congestive Heart Failure Son      Social History Ms. Chilcott reports that she quit smoking about 43 years ago. Her smoking use included cigarettes. She has a 17.50 pack-year smoking history. She has never used smokeless tobacco. Ms. Neuner reports no history of alcohol use.   Review of Systems CONSTITUTIONAL: No weight loss, fever, chills, weakness or fatigue.  HEENT: Eyes: No visual loss, blurred vision, double vision or yellow sclerae.No hearing loss, sneezing, congestion, runny nose or sore throat.  SKIN: No rash or itching.  CARDIOVASCULAR: per hpi RESPIRATORY: per hpi GASTROINTESTINAL: No anorexia, nausea, vomiting or diarrhea. No abdominal pain or blood.  GENITOURINARY: No burning on urination, no polyuria NEUROLOGICAL: No headache, dizziness, syncope,  paralysis, ataxia, numbness or tingling in the extremities. No change in bowel or bladder control.  MUSCULOSKELETAL: No muscle, back pain, joint pain or stiffness.  LYMPHATICS: No enlarged nodes. No history of splenectomy.  PSYCHIATRIC: No history of depression or anxiety.  ENDOCRINOLOGIC: No reports of sweating, cold or heat intolerance. No polyuria or polydipsia.  Marland Kitchen   Physical Examination Today's Vitals   08/30/21 1054  BP: (!) 160/80  Pulse: 65  SpO2: 92%  Weight: 181 lb 12.8 oz (82.5 kg)  Height: 5\' 5"  (1.651 m)   Body mass index is 30.25 kg/m.  Gen: resting comfortably, no acute distress HEENT: no scleral icterus, pupils equal round and reactive, no palptable cervical adenopathy,  CV: RRR, no m/r/g +JVD Resp: faint crackles bilaterally GI: abdomen is soft, non-tender, non-distended, normal bowel sounds, no hepatosplenomegaly MSK: extremities are warm, 1+ bilateral LE edema Skin: warm, no rash Neuro:  no focal deficits Psych: appropriate affect   Diagnostic Studies  Echocardiogram 11/18/2018:  1. The left ventricle has normal systolic function with an ejection  fraction of 60-65%. The cavity size was normal. Left ventricular diastolic  parameters were normal.   2. The right ventricle has mildly reduced systolic function. The cavity  was normal. There is no increase in right ventricular wall thickness.  Right ventricular systolic pressure is moderately elevated with an  estimated pressure of 49.5 mmHg.   3. The aortic valve is tricuspid. Mild calcification of the aortic valve.  Mild to moderate aortic annular calcification noted.   4. The mitral valve is degenerative. Mild thickening of the mitral valve  leaflet. Mild calcification of the mitral valve leaflet. Mitral valve  regurgitation is moderate by color flow Doppler. The MR jet is eccentric  posteriorly directed.   5. The tricuspid valve is grossly normal.   6. The aorta is normal unless otherwise noted.       01/2020 carotid US Summary:  Right Carotid: Velocities in the right ICA are consistent with a 1-39%  stenosis.                 The ECA appears >50% stenosed.   Left Carotid: Velocities in the left ICA are consistent with a 40-59%  stenosis.   Vertebrals:  Bilateral vertebral arteries demonstrate antegrade flow.  Subclavians: Normal flow hemodynamics were seen in bilateral subclavian               arteries.    06/2020 echo   IMPRESSIONS     1. Left ventricular ejection fraction, by estimation, is 60 to 65%. The  left ventricle has normal function. The left ventricle demonstrates  regional wall motion abnormalities (see scoring diagram/findings for  description). Left ventricular diastolic  parameters are consistent with Grade II diastolic dysfunction  (pseudonormalization).   2. Right ventricular systolic function is mildly reduced. The right  ventricular size is normal. There is severely elevated pulmonary artery  systolic pressure. The estimated right ventricular systolic pressure is  60.6 mmHg.   3. The mitral valve is abnormal. Moderate mitral valve regurgitation.   4. Tricuspid valve regurgitation is moderate.   5. The aortic valve is tricuspid. There is mild calcification of the  aortic valve. Aortic valve regurgitation is not visualized.   6. The inferior vena cava is normal in size with <50% respiratory  variability, suggesting right atrial pressure of 8 mmHg.    Assessment and Plan  1. CAD - no symptoms, continue current meds 2. HTN - at goal on recheck, continue current meds   3. Hyperlipidemia - continue current meds   5. Carotid stenosis - stable mild to moderate disease - repeat US next year   6. Pulmonary HTN - noted by recent echo. Most likely secondary to her chronic O2 dependent lung disease, left sided heart disease. I think the possibility of vasodilators having a role is quite small, would not pursue further testing at this time   7. Acute on  chronic diastolic HF - recent ER visit with pulm edema, some signs of ongoing fluid overload - increase lasix to 40mg  in AM and 20mg  in PM, check bmet/mg and a bnp in setting of diastolic HF   F/u 4 months      Arnoldo Lenis, M.D.

## 2021-08-31 NOTE — Telephone Encounter (Signed)
I informed patient that Dr.Branch cleared her for her dental procedures.

## 2021-08-31 NOTE — Telephone Encounter (Signed)
Fine to have dental procedures done   J Gregory Dowe MD

## 2021-09-07 ENCOUNTER — Other Ambulatory Visit (HOSPITAL_COMMUNITY)
Admission: RE | Admit: 2021-09-07 | Discharge: 2021-09-07 | Disposition: A | Discharge status: Home / Self Care | Payer: PPO | Source: Ambulatory Visit | Attending: Cardiology | Admitting: Cardiology

## 2021-09-07 DIAGNOSIS — I10 Essential (primary) hypertension: Secondary | ICD-10-CM | POA: Insufficient documentation

## 2021-09-07 DIAGNOSIS — Z79899 Other long term (current) drug therapy: Secondary | ICD-10-CM | POA: Diagnosis not present

## 2021-09-07 DIAGNOSIS — I5033 Acute on chronic diastolic (congestive) heart failure: Secondary | ICD-10-CM | POA: Diagnosis not present

## 2021-09-07 LAB — BRAIN NATRIURETIC PEPTIDE: B Natriuretic Peptide: 211 pg/mL — ABNORMAL HIGH (ref 0.0–100.0)

## 2021-09-07 LAB — BASIC METABOLIC PANEL
Anion gap: 6 (ref 5–15)
BUN: 22 mg/dL (ref 8–23)
CO2: 33 mmol/L — ABNORMAL HIGH (ref 22–32)
Calcium: 9.2 mg/dL (ref 8.9–10.3)
Chloride: 105 mmol/L (ref 98–111)
Creatinine, Ser: 1.12 mg/dL — ABNORMAL HIGH (ref 0.44–1.00)
GFR, Estimated: 51 mL/min — ABNORMAL LOW (ref >60)
Glucose, Bld: 176 mg/dL — ABNORMAL HIGH (ref 70–99)
Potassium: 4.3 mmol/L (ref 3.5–5.1)
Sodium: 144 mmol/L (ref 135–145)

## 2021-09-07 LAB — MAGNESIUM: Magnesium: 1.8 mg/dL (ref 1.7–2.4)

## 2021-09-11 DIAGNOSIS — J449 Chronic obstructive pulmonary disease, unspecified: Secondary | ICD-10-CM | POA: Diagnosis not present

## 2021-09-11 DIAGNOSIS — I10 Essential (primary) hypertension: Secondary | ICD-10-CM | POA: Diagnosis not present

## 2021-09-11 DIAGNOSIS — Z0000 Encounter for general adult medical examination without abnormal findings: Secondary | ICD-10-CM | POA: Diagnosis not present

## 2021-09-11 DIAGNOSIS — I700 Atherosclerosis of aorta: Secondary | ICD-10-CM | POA: Diagnosis not present

## 2021-09-11 DIAGNOSIS — I5022 Chronic systolic (congestive) heart failure: Secondary | ICD-10-CM | POA: Diagnosis not present

## 2021-09-11 DIAGNOSIS — E7849 Other hyperlipidemia: Secondary | ICD-10-CM | POA: Diagnosis not present

## 2021-09-11 DIAGNOSIS — F411 Generalized anxiety disorder: Secondary | ICD-10-CM | POA: Diagnosis not present

## 2021-09-11 DIAGNOSIS — E1143 Type 2 diabetes mellitus with diabetic autonomic (poly)neuropathy: Secondary | ICD-10-CM | POA: Diagnosis not present

## 2021-09-11 DIAGNOSIS — Z6831 Body mass index (BMI) 31.0-31.9, adult: Secondary | ICD-10-CM | POA: Diagnosis not present

## 2021-09-11 DIAGNOSIS — K219 Gastro-esophageal reflux disease without esophagitis: Secondary | ICD-10-CM | POA: Diagnosis not present

## 2021-10-05 ENCOUNTER — Telehealth: Payer: Self-pay | Admitting: *Deleted

## 2021-10-05 NOTE — Telephone Encounter (Signed)
Laurine Blazer, LPN  9/56/3875  6:43 PM EDT Back to Top    Notified via detailed voice mail, copy to pcp.

## 2021-10-05 NOTE — Telephone Encounter (Signed)
-----   Message from Arnoldo Lenis, MD sent at 09/26/2021  4:31 PM EDT ----- Labs look fine  Bettey Costa MD

## 2021-10-13 DIAGNOSIS — U071 COVID-19: Secondary | ICD-10-CM | POA: Diagnosis not present

## 2021-10-13 DIAGNOSIS — J441 Chronic obstructive pulmonary disease with (acute) exacerbation: Secondary | ICD-10-CM | POA: Diagnosis not present

## 2021-10-21 ENCOUNTER — Encounter (HOSPITAL_COMMUNITY): Payer: Self-pay | Admitting: Emergency Medicine

## 2021-10-21 ENCOUNTER — Other Ambulatory Visit: Payer: Self-pay

## 2021-10-21 ENCOUNTER — Emergency Department (HOSPITAL_COMMUNITY): Payer: PPO

## 2021-10-21 ENCOUNTER — Emergency Department (HOSPITAL_COMMUNITY)
Admission: EM | Admit: 2021-10-21 | Discharge: 2021-10-21 | Disposition: A | Discharge status: Home / Self Care | Payer: PPO | Attending: Emergency Medicine | Admitting: Emergency Medicine

## 2021-10-21 DIAGNOSIS — I509 Heart failure, unspecified: Secondary | ICD-10-CM | POA: Diagnosis not present

## 2021-10-21 DIAGNOSIS — F17210 Nicotine dependence, cigarettes, uncomplicated: Secondary | ICD-10-CM | POA: Diagnosis not present

## 2021-10-21 DIAGNOSIS — R0602 Shortness of breath: Secondary | ICD-10-CM | POA: Diagnosis not present

## 2021-10-21 DIAGNOSIS — I110 Hypertensive heart disease with heart failure: Secondary | ICD-10-CM | POA: Diagnosis not present

## 2021-10-21 DIAGNOSIS — Z79899 Other long term (current) drug therapy: Secondary | ICD-10-CM | POA: Diagnosis not present

## 2021-10-21 DIAGNOSIS — Z7982 Long term (current) use of aspirin: Secondary | ICD-10-CM | POA: Diagnosis not present

## 2021-10-21 DIAGNOSIS — R600 Localized edema: Secondary | ICD-10-CM | POA: Diagnosis not present

## 2021-10-21 DIAGNOSIS — N289 Disorder of kidney and ureter, unspecified: Secondary | ICD-10-CM | POA: Diagnosis not present

## 2021-10-21 DIAGNOSIS — E119 Type 2 diabetes mellitus without complications: Secondary | ICD-10-CM | POA: Insufficient documentation

## 2021-10-21 DIAGNOSIS — Z85118 Personal history of other malignant neoplasm of bronchus and lung: Secondary | ICD-10-CM | POA: Insufficient documentation

## 2021-10-21 DIAGNOSIS — E860 Dehydration: Secondary | ICD-10-CM | POA: Insufficient documentation

## 2021-10-21 DIAGNOSIS — D649 Anemia, unspecified: Secondary | ICD-10-CM | POA: Diagnosis not present

## 2021-10-21 DIAGNOSIS — R059 Cough, unspecified: Secondary | ICD-10-CM | POA: Diagnosis not present

## 2021-10-21 DIAGNOSIS — J449 Chronic obstructive pulmonary disease, unspecified: Secondary | ICD-10-CM | POA: Insufficient documentation

## 2021-10-21 LAB — CBC WITH DIFFERENTIAL/PLATELET
Abs Immature Granulocytes: 0.02 10*3/uL (ref 0.00–0.07)
Basophils Absolute: 0 10*3/uL (ref 0.0–0.1)
Basophils Relative: 1 %
Eosinophils Absolute: 0.1 10*3/uL (ref 0.0–0.5)
Eosinophils Relative: 1 %
HCT: 32.1 % — ABNORMAL LOW (ref 36.0–46.0)
Hemoglobin: 9.9 g/dL — ABNORMAL LOW (ref 12.0–15.0)
Immature Granulocytes: 0 %
Lymphocytes Relative: 26 %
Lymphs Abs: 1.3 10*3/uL (ref 0.7–4.0)
MCH: 31.1 pg (ref 26.0–34.0)
MCHC: 30.8 g/dL (ref 30.0–36.0)
MCV: 100.9 fL — ABNORMAL HIGH (ref 80.0–100.0)
Monocytes Absolute: 0.4 10*3/uL (ref 0.1–1.0)
Monocytes Relative: 8 %
Neutro Abs: 3.2 10*3/uL (ref 1.7–7.7)
Neutrophils Relative %: 64 %
Platelets: 179 10*3/uL (ref 150–400)
RBC: 3.18 MIL/uL — ABNORMAL LOW (ref 3.87–5.11)
RDW: 15 % (ref 11.5–15.5)
WBC: 5 10*3/uL (ref 4.0–10.5)
nRBC: 0 % (ref 0.0–0.2)

## 2021-10-21 LAB — COMPREHENSIVE METABOLIC PANEL
ALT: 14 U/L (ref 0–44)
AST: 14 U/L — ABNORMAL LOW (ref 15–41)
Albumin: 3.4 g/dL — ABNORMAL LOW (ref 3.5–5.0)
Alkaline Phosphatase: 58 U/L (ref 38–126)
Anion gap: 6 (ref 5–15)
BUN: 23 mg/dL (ref 8–23)
CO2: 29 mmol/L (ref 22–32)
Calcium: 9.2 mg/dL (ref 8.9–10.3)
Chloride: 108 mmol/L (ref 98–111)
Creatinine, Ser: 1.34 mg/dL — ABNORMAL HIGH (ref 0.44–1.00)
GFR, Estimated: 41 mL/min — ABNORMAL LOW (ref >60)
Glucose, Bld: 174 mg/dL — ABNORMAL HIGH (ref 70–99)
Potassium: 5.2 mmol/L — ABNORMAL HIGH (ref 3.5–5.1)
Sodium: 143 mmol/L (ref 135–145)
Total Bilirubin: 0.6 mg/dL (ref 0.3–1.2)
Total Protein: 7.3 g/dL (ref 6.5–8.1)

## 2021-10-21 LAB — PROTIME-INR
INR: 1.2 (ref 0.8–1.2)
Prothrombin Time: 15.3 seconds — ABNORMAL HIGH (ref 11.4–15.2)

## 2021-10-21 LAB — URINALYSIS, ROUTINE W REFLEX MICROSCOPIC
Bacteria, UA: NONE SEEN
Bilirubin Urine: NEGATIVE
Glucose, UA: NEGATIVE mg/dL
Hgb urine dipstick: NEGATIVE
Ketones, ur: NEGATIVE mg/dL
Nitrite: NEGATIVE
Protein, ur: 30 mg/dL — AB
Specific Gravity, Urine: 1.013 (ref 1.005–1.030)
pH: 6 (ref 5.0–8.0)

## 2021-10-21 LAB — CBG MONITORING, ED: Glucose-Capillary: 150 mg/dL — ABNORMAL HIGH (ref 70–99)

## 2021-10-21 LAB — MAGNESIUM: Magnesium: 2.2 mg/dL (ref 1.7–2.4)

## 2021-10-21 LAB — LACTIC ACID, PLASMA: Lactic Acid, Venous: 1.1 mmol/L (ref 0.5–1.9)

## 2021-10-21 LAB — BRAIN NATRIURETIC PEPTIDE: B Natriuretic Peptide: 328 pg/mL — ABNORMAL HIGH (ref 0.0–100.0)

## 2021-10-21 MED ORDER — SODIUM CHLORIDE 0.9 % IV SOLN: INTRAVENOUS | Status: DC

## 2021-10-21 MED ORDER — SODIUM CHLORIDE 0.9 % IV BOLUS
1000.0000 mL | Freq: Once | INTRAVENOUS | Status: AC
  Administered 2021-10-21: 1000 mL via INTRAVENOUS

## 2021-10-21 MED ORDER — CEPHALEXIN 500 MG PO CAPS: 500.0000 mg | ORAL_CAPSULE | Freq: Three times a day (TID) | ORAL | 0 refills | Status: AC

## 2021-10-21 MED ORDER — CEPHALEXIN 500 MG PO CAPS
500.0000 mg | ORAL_CAPSULE | Freq: Once | ORAL | Status: AC
  Administered 2021-10-21: 500 mg via ORAL
  Filled 2021-10-21: qty 1

## 2021-10-21 NOTE — ED Triage Notes (Signed)
Patient c/o shortness of breath, fatigue, and loss of appetite x1 week. Per patient dry cough. Denies any chest pain or fevers. Per patient nausea without vomiting. Patient has COPD and wears O2 via [place] as needed. Family states patient has had to wear O2 constantly at 2 liters for past week. Patient used inhaler with no relief.

## 2021-10-21 NOTE — Discharge Instructions (Signed)
Your testing today shows that you are a bit dehydrated, it also shows that you probably have an early urinary infection.  Because of this I would like for you to do the following  Drink plenty of clear liquids Follow-up with your family doctor in 3 days to recheck your urine and follow-up your urine culture.  They will also need to recheck your kidney function Cephalexin 3 times daily for 5 days to treat the urine infection  Please make sure that you are taking plenty of clear liquids every day, you will know that you are getting enough liquids if your urine is coming out clear, if it starts to turn yellow you are likely becoming dehydrated again.

## 2021-10-21 NOTE — ED Provider Notes (Signed)
[facility] EMERGENCY DEPARTMENT Provider Note   CSN: [id number] Arrival date & time: 10/21/21  1230     History  Chief Complaint  Patient presents with   Shortness of Breath    Karen Tate is a 76 y.o. female.   Shortness of Breath   This patient is a 76 year old female, she has a known history of some type of lung disease, followed by pulmonology, uses oxygen when she exerts herself or when she is sleeping, this morning the daughter states that she was called over to the house because her mother sounded short of breath on the phone, felt like she could not understand her well and when she arrived she found her sleeping in her recliner.  She was a little bit difficult to arouse but states that she appears to be close to her baseline at this time.  She was on nasal cannula and found to be a little hypoxic.  On arrival on room air the patient was in the 80% range and placed on oxygen she is back to normal.  The patient does not have any focal weakness, states she has had a decreased appetite for the last week and a half or 2 and states that she nibbles throughout the day.  Yesterday she ate a bologna sandwich, this morning she has not had anything.  Her blood sugar was 180 this morning.  Review of the medical record shows the patient last had an echocardiogram about a year and a half ago, ejection fraction of 60 to 65% and grade 2 diastolic dysfunction  She follows with Dr. Halford Chessman of the pulmonology service and has been noted to have COPD, she continues to use albuterol and Advair.  She had followed with Dr. Delton Coombes at the [facility] secondary to a history of lung cancer status post resection, she did not go on to chemotherapy or radiation.  Goal is oxygen above 90% according to the office visits pulmonology notes from March 2023.  The patient denies any fever, chills, back pain, chest pain, belly pain, nausea vomiting or diarrhea.  Home Medications Prior to  Admission medications   Medication Sig Start Date End Date Taking? Authorizing Provider  cephALEXin (KEFLEX) 500 MG capsule Take 1 capsule (500 mg total) by mouth 3 (three) times daily for 5 days. 10/21/21 10/26/21 Yes Noemi Chapel, MD  acetaminophen (TYLENOL) 500 MG tablet Take 1,000 mg by mouth every 6 (six) hours as needed for moderate pain.    [provider]  albuterol (VENTOLIN HFA) 108 (90 Base) MCG/ACT inhaler Inhale 2 puffs into the lungs every 6 (six) hours as needed. 03/01/21   Chesley Mires, MD  ALPRAZolam Duanne Moron) 0.5 MG tablet Take 0.5 mg by mouth 2 (two) times daily.    [provider]  amLODipine (NORVASC) 10 MG tablet Take 10 mg by mouth daily.    [provider]  aspirin 81 MG tablet Take 1 tablet (81 mg total) by mouth daily. 10/20/17   Nani Skillern, PA-C  atorvastatin (LIPITOR) 80 MG tablet Take 1 tablet (80 mg total) by mouth daily at 6 PM. 11/17/17   Herminio Commons, MD  carvedilol (COREG) 25 MG tablet Take 1 tablet by mouth 2 (two) times daily. 01/14/20   [provider]  diclofenac Sodium (VOLTAREN) 1 % GEL Apply 2 g topically 4 (four) times daily.    [provider]  fish oil-omega-3 fatty acids 1000 MG capsule Take 1 g by mouth 3 (three) times  daily.     [provider]  fluticasone-salmeterol (ADVAIR) 250-50 MCG/ACT AEPB Inhale 1 puff into the lungs in the morning and at bedtime. Advair 250/50 one puff 2 times/ day 03/30/21   Chesley Mires, MD  folic acid (FOLVITE) 1 MG tablet TAKE 1 TABLET EVERY DAY 02/20/21   Tarri Abernethy M, PA-C  furosemide (LASIX) 20 MG tablet Take 2 tablets (40 mg total) by mouth in the morning. & 20 mg in the late afternoon 08/30/21   Arnoldo Lenis, MD  latanoprost (XALATAN) 0.005 % ophthalmic solution Place 1 drop into both eyes at bedtime. 02/10/19   [provider]  lisinopril (ZESTRIL) 40 MG tablet TAKE 1 TABLET EVERY DAY ( DOSE INCREASE ) 08/23/20   Branch, Alphonse Guild, MD   magnesium oxide (MAG-OX) 400 MG tablet Take 1 tablet (400 mg total) by mouth 2 (two) times daily. 10/10/17   Dunn, Nedra Hai, PA-C  meclizine (ANTIVERT) 25 MG tablet Take 25 mg by mouth 3 (three) times daily as needed for dizziness.     [provider]  Multiple Vitamins-Minerals (CENTRUM SILVER 50+WOMEN) TABS Take 1 tablet by mouth daily.     [provider]  omeprazole (PRILOSEC) 40 MG capsule Take 40 mg by mouth daily.    [provider]  potassium chloride SA (KLOR-CON M) 20 MEQ tablet Take 2 tablets (40 mEq total) by mouth daily. 08/30/2021 dose increase 08/30/21   Arnoldo Lenis, MD  tiZANidine (ZANAFLEX) 4 MG tablet Take 4 mg by mouth at bedtime as needed. 11/14/20   [provider]  traMADol (ULTRAM) 50 MG tablet Take 50 mg by mouth 3 (three) times daily as needed. 12/29/20   [provider]  vitamin B-12 (CYANOCOBALAMIN) 500 MCG tablet Take 500 mcg by mouth daily.    [provider]      Allergies    Beta adrenergic blockers, Calcium channel blockers, and Naproxen    Review of Systems   Review of Systems  Respiratory:  Positive for shortness of breath.   All other systems reviewed and are negative.   Physical Exam Updated Vital Signs BP (!) 153/66   Pulse 66   Temp 97.8 F (36.6 C) (Oral)   Resp (!) 24   Ht 1.651 m (5\' 5" )   Wt 80.7 kg   SpO2 95%   BMI 29.62 kg/m  Physical Exam Vitals and nursing note reviewed.  Constitutional:      General: She is not in acute distress.    Appearance: She is well-developed.  HENT:     Head: Normocephalic and atraumatic.     Mouth/Throat:     Mouth: Mucous membranes are moist.     Pharynx: No oropharyngeal exudate.  Eyes:     General: No scleral icterus.       Right eye: No discharge.        Left eye: No discharge.     Conjunctiva/sclera: Conjunctivae normal.     Pupils: Pupils are equal, round, and reactive to light.  Neck:     Thyroid: No thyromegaly.     Vascular: No  JVD.  Cardiovascular:     Rate and Rhythm: Normal rate and regular rhythm.     Heart sounds: Normal heart sounds. No murmur heard.    No friction rub. No gallop.  Pulmonary:     Effort: Pulmonary effort is normal. No respiratory distress.     Breath sounds: Normal breath sounds. No wheezing or rales.  Abdominal:  General: Bowel sounds are normal. There is no distension.     Palpations: Abdomen is soft. There is no mass.     Tenderness: There is no abdominal tenderness.  Musculoskeletal:        General: No tenderness. Normal range of motion.     Cervical back: Normal range of motion and neck supple.     Right lower leg: Edema present.     Left lower leg: Edema present.     Comments: Symmetrical Skains bilateral lower extremity edema  Lymphadenopathy:     Cervical: No cervical adenopathy.  Skin:    General: Skin is warm and dry.     Findings: No erythema or rash.  Neurological:     General: No focal deficit present.     Mental Status: She is alert.     Coordination: Coordination normal.     Comments: The patient is awake alert and able to follow commands without difficulty.  She has normal cranial nerves III through XII, normal speech normal recollection normal memory and normal strength in all 4 extremities.  Coordination is normal  Psychiatric:        Behavior: Behavior normal.     ED Results / Procedures / Treatments   Labs (all labs ordered are listed, but only abnormal results are displayed) Labs Reviewed  COMPREHENSIVE METABOLIC PANEL - Abnormal; Notable for the following components:      Result Value   Potassium 5.2 (*)    Glucose, Bld 174 (*)    Creatinine, Ser 1.34 (*)    Albumin 3.4 (*)    AST 14 (*)    GFR, Estimated 41 (*)    All other components within normal limits  CBC WITH DIFFERENTIAL/PLATELET - Abnormal; Notable for the following components:   RBC 3.18 (*)    Hemoglobin 9.9 (*)    HCT 32.1 (*)    MCV 100.9 (*)    All other components within normal  limits  URINALYSIS, ROUTINE W REFLEX MICROSCOPIC - Abnormal; Notable for the following components:   Protein, ur 30 (*)    Leukocytes,Ua LARGE (*)    All other components within normal limits  PROTIME-INR - Abnormal; Notable for the following components:   Prothrombin Time 15.3 (*)    All other components within normal limits  BRAIN NATRIURETIC PEPTIDE - Abnormal; Notable for the following components:   B Natriuretic Peptide 328.0 (*)    All other components within normal limits  CBG MONITORING, ED - Abnormal; Notable for the following components:   Glucose-Capillary 150 (*)    All other components within normal limits  URINE CULTURE  LACTIC ACID, PLASMA  MAGNESIUM    EKG EKG Interpretation  Date/Time:  Sunday October 21 2021 12:46:12 EDT Ventricular Rate:  64 PR Interval:  136 QRS Duration: 83 QT Interval:  350 QTC Calculation: 361 R Axis:   66 Text Interpretation: Sinus or ectopic atrial rhythm Nonspecific repol abnormality, diffuse leads since last tracing no significant change Confirmed by Noemi Chapel [phone]) on 10/21/2021 1:08:12 PM  Radiology DG Chest Port 1 View  Result Date: 10/21/2021 CLINICAL DATA:  Cough and shortness of breath. EXAM: PORTABLE CHEST 1 VIEW COMPARISON:  Aug 01, 2021 FINDINGS: Stable cardiomegaly. Increased interstitial markings in the lungs. Possible small effusion on the right. No pneumothorax. No other acute abnormalities. IMPRESSION: Findings are most consistent with cardiomegaly, pulmonary venous congestion/mild edema, and possibly a small right effusion. Electronically Signed   By: Dorise Bullion III M.D.   On:  10/21/2021 13:48    Procedures Procedures    Medications Ordered in ED Medications  sodium chloride 0.9 % bolus 1,000 mL (1,000 mLs Intravenous New Bag/Given 10/21/21 1329)    And  0.9 %  sodium chloride infusion (0 mLs Intravenous Hold 10/21/21 1404)  cephALEXin (KEFLEX) capsule 500 mg (500 mg Oral Given 10/21/21 1503)    ED Course/  Medical Decision Making/ A&P                           Medical Decision Making Amount and/or Complexity of Data Reviewed Labs: ordered. Radiology: ordered.  Risk Prescription drug management.   I have turned the patient's oxygen off and she is now 96% on room air without distress speaking in full sentences and a normal mental status  This patient presents to the ED for concern of decreased appetite generalized weakness and possible hypoxia, this involves an extensive number of treatment options, and is a complaint that carries with it a high risk of complications and morbidity.  The differential diagnosis includes pneumonia, electrolyte disturbance, renal failure due to decreased oral intake, she could have electrolyte disturbances, urinary infections, she has no signs or symptoms of stroke   Co morbidities that complicate the patient evaluation  COPD, hypertension, congestive heart failure, diabetes   Additional history obtained:  Additional history obtained from electronic medical record External records from outside source obtained and reviewed including cardiology notes, pulmonology notes, oncology notes, see history   Lab Tests:  I Ordered, and personally interpreted labs.  The pertinent results include: Urinalysis with white blood cells, renal insufficiency based on creatinine of 1.3 up from 1.1, no significant leukocytosis or anemia.  Lactate normal, glucose 170, LFTs normal.  Anemia is chronic baseline at 9.9.  Magnesium normal, BNP at 328 consistent with multiple prior values.   Imaging Studies ordered:  I ordered imaging studies including chest x-ray I independently visualized and interpreted imaging which showed possible early pulmonary edema or pleural effusion, no infiltrate I agree with the radiologist interpretation   Cardiac Monitoring: / EKG:  The patient was maintained on a cardiac monitor.  I personally viewed and interpreted the cardiac monitored which  showed an underlying rhythm of: Normal sinus rhythm   Consultations Obtained:  No consultations needed  Problem List / ED Course / Critical interventions / Medication management  The patient received IV fluids, she felt better, she remained alert awake and not hypoxic whatsoever.  Her oxygen was 95% on room air I ordered medication including cephalexin and IV fluids for renal insufficiency dehydration and likely early urinary tract infection Reevaluation of the patient after these medicines showed that the patient improved I have reviewed the patients home medicines and have made adjustments as needed   Social Determinants of Health:  None   Test / Admission - Considered:  Considered admission but the patient is very stable for discharge, discussed the care with the patient and both of her daughters were at the bedside.  Will initiate home health for physical therapy Cephalexin for UTI  I have discussed with the patient at the bedside the results, and the meaning of these results.  They have expressed her understanding to the need for follow-up with primary care physician        Final Clinical Impression(s) / ED Diagnoses Final diagnoses:  Dehydration  Renal insufficiency    Rx / DC Orders ED Discharge Orders  Ordered    Home Health        10/21/21 1500    Face-to-face encounter (required for Medicare/Medicaid patients)       Comments: I Johnna Acosta certify that this patient is under my care and that I, or a nurse practitioner or physician's assistant working with me, had a face-to-face encounter that meets the physician face-to-face encounter requirements with this patient on 10/21/2021. The encounter with the patient was in whole, or in part for the following medical condition(s) which is the primary reason for home health care (List medical condition): weakness, chronic pain   10/21/21 1500    cephALEXin (KEFLEX) 500 MG capsule  3 times daily         10/21/21 1500              Noemi Chapel, MD 10/21/21 [phone]

## 2021-10-22 LAB — URINE CULTURE

## 2021-10-24 DIAGNOSIS — R630 Anorexia: Secondary | ICD-10-CM | POA: Diagnosis not present

## 2021-10-24 DIAGNOSIS — N3090 Cystitis, unspecified without hematuria: Secondary | ICD-10-CM | POA: Diagnosis not present

## 2021-10-24 DIAGNOSIS — Z6831 Body mass index (BMI) 31.0-31.9, adult: Secondary | ICD-10-CM | POA: Diagnosis not present

## 2021-11-07 ENCOUNTER — Ambulatory Visit (HOSPITAL_COMMUNITY)
Admission: RE | Admit: 2021-11-07 | Discharge: 2021-11-07 | Disposition: A | Discharge status: Home / Self Care | Payer: PPO | Source: Ambulatory Visit | Attending: Hematology | Admitting: Hematology

## 2021-11-07 ENCOUNTER — Inpatient Hospital Stay: Payer: PPO | Attending: Hematology

## 2021-11-07 DIAGNOSIS — Z85118 Personal history of other malignant neoplasm of bronchus and lung: Secondary | ICD-10-CM | POA: Insufficient documentation

## 2021-11-07 DIAGNOSIS — D649 Anemia, unspecified: Secondary | ICD-10-CM | POA: Diagnosis not present

## 2021-11-07 DIAGNOSIS — R911 Solitary pulmonary nodule: Secondary | ICD-10-CM | POA: Diagnosis not present

## 2021-11-07 DIAGNOSIS — C3491 Malignant neoplasm of unspecified part of right bronchus or lung: Secondary | ICD-10-CM

## 2021-11-07 DIAGNOSIS — J90 Pleural effusion, not elsewhere classified: Secondary | ICD-10-CM | POA: Diagnosis not present

## 2021-11-07 DIAGNOSIS — E538 Deficiency of other specified B group vitamins: Secondary | ICD-10-CM | POA: Diagnosis not present

## 2021-11-07 DIAGNOSIS — C3490 Malignant neoplasm of unspecified part of unspecified bronchus or lung: Secondary | ICD-10-CM | POA: Diagnosis not present

## 2021-11-07 LAB — CBC WITH DIFFERENTIAL/PLATELET
Abs Immature Granulocytes: 0.01 10*3/uL (ref 0.00–0.07)
Basophils Absolute: 0 10*3/uL (ref 0.0–0.1)
Basophils Relative: 1 %
Eosinophils Absolute: 0.1 10*3/uL (ref 0.0–0.5)
Eosinophils Relative: 1 %
HCT: 35.6 % — ABNORMAL LOW (ref 36.0–46.0)
Hemoglobin: 10.7 g/dL — ABNORMAL LOW (ref 12.0–15.0)
Immature Granulocytes: 0 %
Lymphocytes Relative: 21 %
Lymphs Abs: 1.2 10*3/uL (ref 0.7–4.0)
MCH: 30.1 pg (ref 26.0–34.0)
MCHC: 30.1 g/dL (ref 30.0–36.0)
MCV: 100.3 fL — ABNORMAL HIGH (ref 80.0–100.0)
Monocytes Absolute: 0.4 10*3/uL (ref 0.1–1.0)
Monocytes Relative: 7 %
Neutro Abs: 4.2 10*3/uL (ref 1.7–7.7)
Neutrophils Relative %: 70 %
Platelets: 241 10*3/uL (ref 150–400)
RBC: 3.55 MIL/uL — ABNORMAL LOW (ref 3.87–5.11)
RDW: 14.9 % (ref 11.5–15.5)
WBC: 5.9 10*3/uL (ref 4.0–10.5)
nRBC: 0 % (ref 0.0–0.2)

## 2021-11-07 LAB — FOLATE: Folate: >40 ng/mL (ref >5.9)

## 2021-11-07 LAB — COMPREHENSIVE METABOLIC PANEL
ALT: 14 U/L (ref 0–44)
AST: 16 U/L (ref 15–41)
Albumin: 3.5 g/dL (ref 3.5–5.0)
Alkaline Phosphatase: 53 U/L (ref 38–126)
Anion gap: 8 (ref 5–15)
BUN: 20 mg/dL (ref 8–23)
CO2: 30 mmol/L (ref 22–32)
Calcium: 9.6 mg/dL (ref 8.9–10.3)
Chloride: 106 mmol/L (ref 98–111)
Creatinine, Ser: 1.33 mg/dL — ABNORMAL HIGH (ref 0.44–1.00)
GFR, Estimated: 41 mL/min — ABNORMAL LOW (ref >60)
Glucose, Bld: 174 mg/dL — ABNORMAL HIGH (ref 70–99)
Potassium: 3.8 mmol/L (ref 3.5–5.1)
Sodium: 144 mmol/L (ref 135–145)
Total Bilirubin: 0.5 mg/dL (ref 0.3–1.2)
Total Protein: 7.7 g/dL (ref 6.5–8.1)

## 2021-11-07 LAB — VITAMIN B12: Vitamin B-12: 850 pg/mL (ref 180–914)

## 2021-11-07 LAB — IRON AND TIBC
Iron: 52 ug/dL (ref 28–170)
Saturation Ratios: 19 % (ref 10.4–31.8)
TIBC: 272 ug/dL (ref 250–450)
UIBC: 220 ug/dL

## 2021-11-07 LAB — FERRITIN: Ferritin: 417 ng/mL — ABNORMAL HIGH (ref 11–307)

## 2021-11-07 MED ORDER — IOHEXOL 300 MG/ML  SOLN
100.0000 mL | Freq: Once | INTRAMUSCULAR | Status: AC | PRN
  Administered 2021-11-07: 60 mL via INTRAVENOUS

## 2021-11-12 DIAGNOSIS — M4807 Spinal stenosis, lumbosacral region: Secondary | ICD-10-CM | POA: Diagnosis not present

## 2021-11-12 DIAGNOSIS — G6189 Other inflammatory polyneuropathies: Secondary | ICD-10-CM | POA: Diagnosis not present

## 2021-11-12 DIAGNOSIS — Z6831 Body mass index (BMI) 31.0-31.9, adult: Secondary | ICD-10-CM | POA: Diagnosis not present

## 2021-11-12 DIAGNOSIS — E7849 Other hyperlipidemia: Secondary | ICD-10-CM | POA: Diagnosis not present

## 2021-11-12 DIAGNOSIS — I10 Essential (primary) hypertension: Secondary | ICD-10-CM | POA: Diagnosis not present

## 2021-11-12 DIAGNOSIS — L8990 Pressure ulcer of unspecified site, unspecified stage: Secondary | ICD-10-CM | POA: Diagnosis not present

## 2021-11-12 DIAGNOSIS — E1143 Type 2 diabetes mellitus with diabetic autonomic (poly)neuropathy: Secondary | ICD-10-CM | POA: Diagnosis not present

## 2021-11-13 DIAGNOSIS — J441 Chronic obstructive pulmonary disease with (acute) exacerbation: Secondary | ICD-10-CM | POA: Diagnosis not present

## 2021-11-13 DIAGNOSIS — U071 COVID-19: Secondary | ICD-10-CM | POA: Diagnosis not present

## 2021-11-14 ENCOUNTER — Inpatient Hospital Stay (HOSPITAL_COMMUNITY): Payer: PPO | Attending: Hematology | Admitting: Hematology

## 2021-11-19 DIAGNOSIS — E1142 Type 2 diabetes mellitus with diabetic polyneuropathy: Secondary | ICD-10-CM | POA: Diagnosis not present

## 2021-11-21 IMAGING — DX DG CHEST 2V
2 series · 2 of 2 positions shown · non-contrast
Comparison: 07/20/2020

CLINICAL DATA: Shortness of breath

EXAM:
CHEST - 2 VIEW

[chest pa]
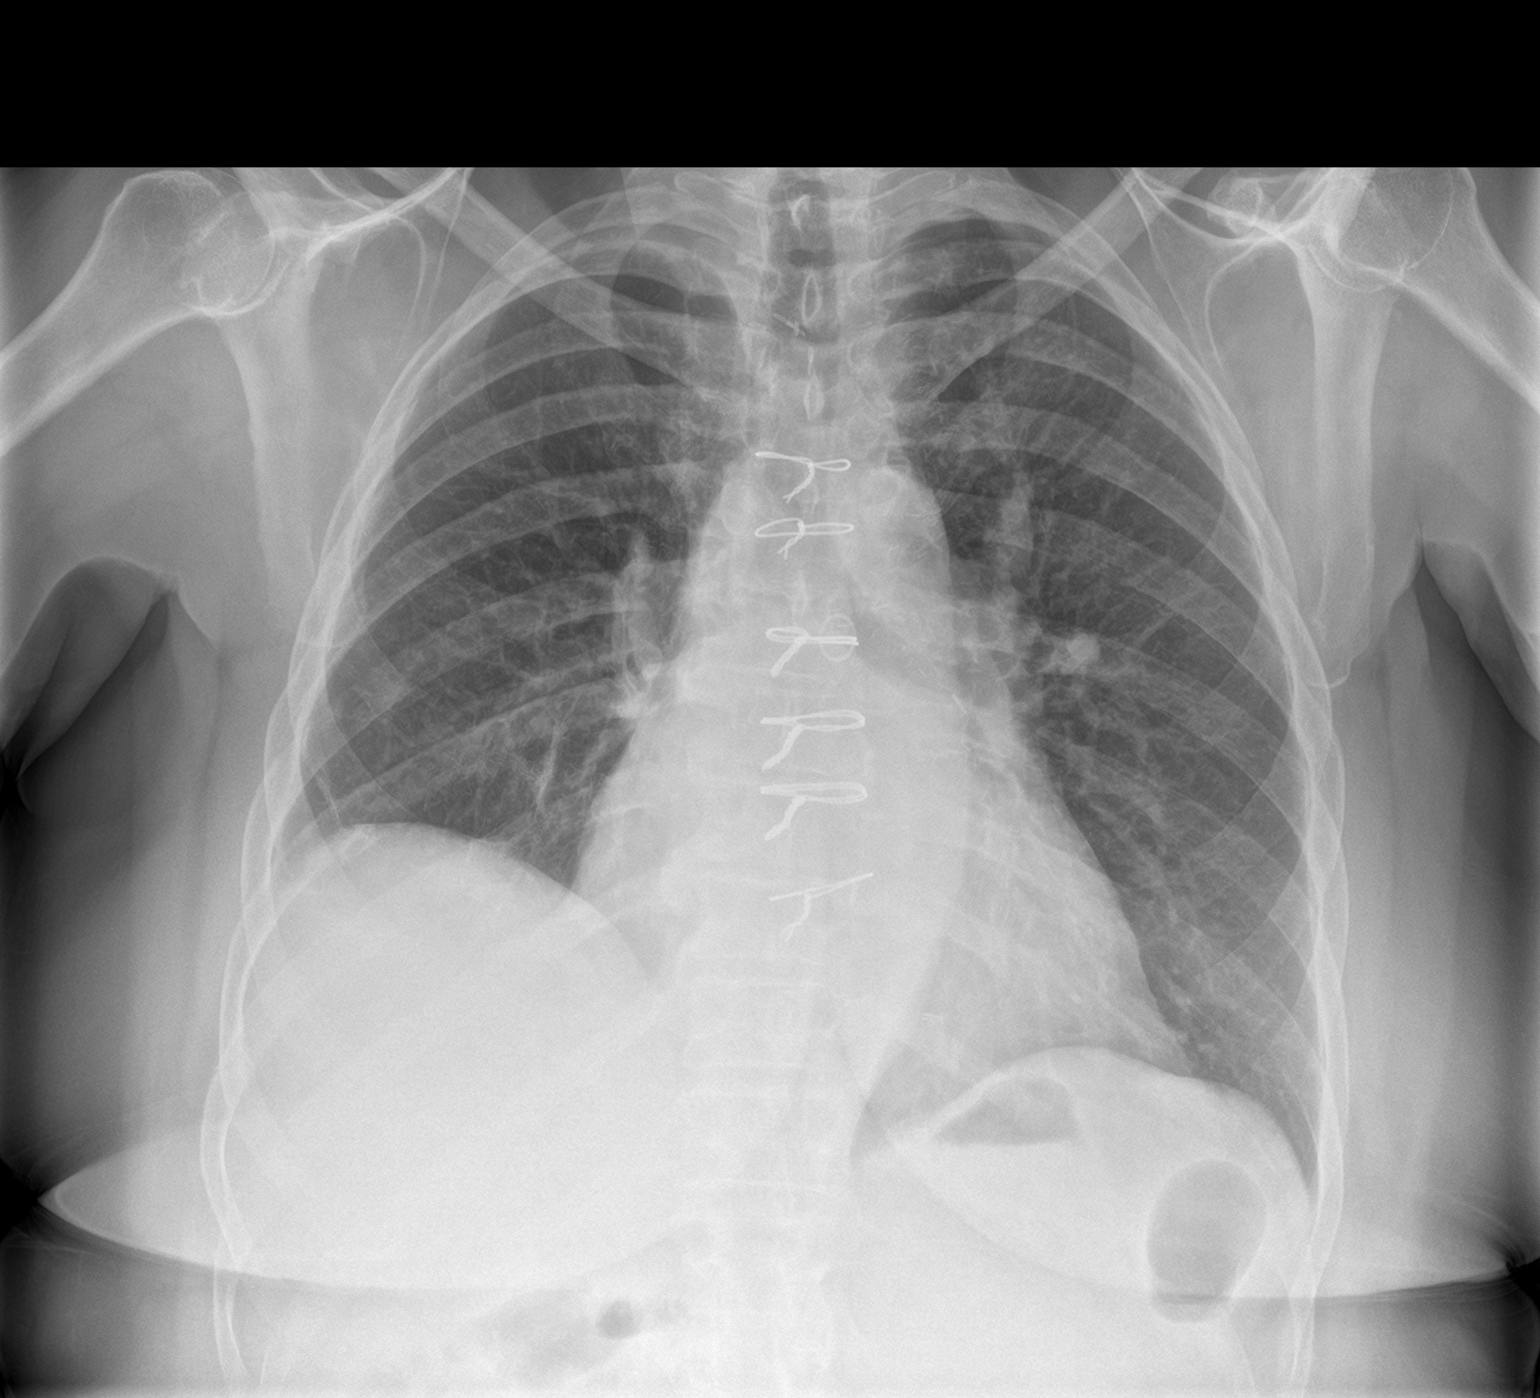

[chest lat]
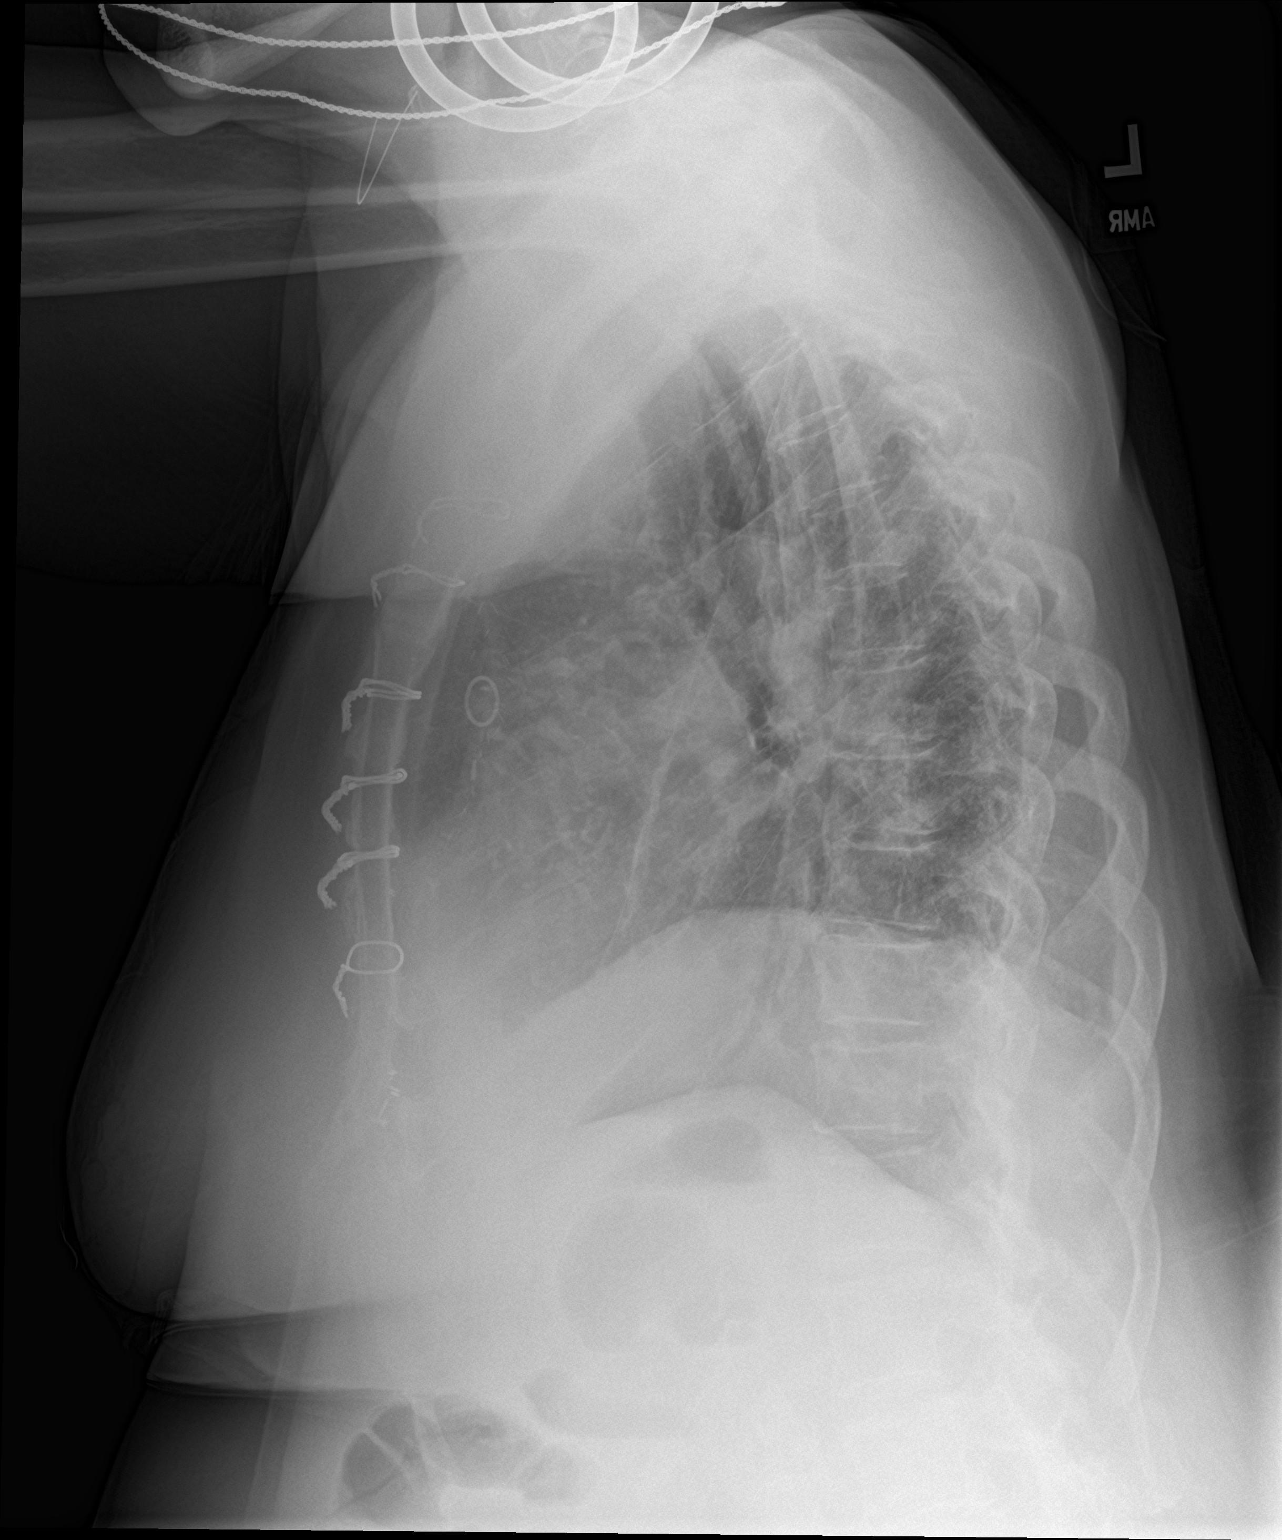

[2 of 2 positions shown; findings below may reference images not displayed]

FINDINGS: Cardiac shadow is stable. Postsurgical changes are again noted.
Previously seen right-sided effusion and infiltrate have resolved in
the interval. Elevation the right hemidiaphragm is again noted. No
new focal infiltrate is seen. No bony abnormality is noted.
IMPRESSION: No acute abnormality noted.

## 2021-11-22 DIAGNOSIS — G6189 Other inflammatory polyneuropathies: Secondary | ICD-10-CM | POA: Diagnosis not present

## 2021-11-22 DIAGNOSIS — I10 Essential (primary) hypertension: Secondary | ICD-10-CM | POA: Diagnosis not present

## 2021-11-22 DIAGNOSIS — E1143 Type 2 diabetes mellitus with diabetic autonomic (poly)neuropathy: Secondary | ICD-10-CM | POA: Diagnosis not present

## 2021-11-27 ENCOUNTER — Inpatient Hospital Stay: Payer: PPO | Attending: Hematology | Admitting: Hematology

## 2021-11-27 ENCOUNTER — Other Ambulatory Visit: Payer: Self-pay | Admitting: *Deleted

## 2021-11-27 ENCOUNTER — Encounter: Payer: Self-pay | Admitting: Pulmonary Disease

## 2021-11-27 ENCOUNTER — Ambulatory Visit: Payer: PPO | Admitting: Pulmonary Disease

## 2021-11-27 VITALS — BP 126/56 | HR 62 | Temp 97.0°F | Resp 17 | Ht 65.0 in | Wt 165.4 lb

## 2021-11-27 VITALS — BP 124/60 | HR 62 | Temp 98.0°F | Ht 65.0 in | Wt 165.0 lb

## 2021-11-27 DIAGNOSIS — Z79899 Other long term (current) drug therapy: Secondary | ICD-10-CM | POA: Diagnosis not present

## 2021-11-27 DIAGNOSIS — J449 Chronic obstructive pulmonary disease, unspecified: Secondary | ICD-10-CM | POA: Diagnosis not present

## 2021-11-27 DIAGNOSIS — D649 Anemia, unspecified: Secondary | ICD-10-CM | POA: Insufficient documentation

## 2021-11-27 DIAGNOSIS — Z7982 Long term (current) use of aspirin: Secondary | ICD-10-CM | POA: Insufficient documentation

## 2021-11-27 DIAGNOSIS — Z902 Acquired absence of lung [part of]: Secondary | ICD-10-CM | POA: Diagnosis not present

## 2021-11-27 DIAGNOSIS — Z87891 Personal history of nicotine dependence: Secondary | ICD-10-CM | POA: Insufficient documentation

## 2021-11-27 DIAGNOSIS — N182 Chronic kidney disease, stage 2 (mild): Secondary | ICD-10-CM | POA: Insufficient documentation

## 2021-11-27 DIAGNOSIS — E538 Deficiency of other specified B group vitamins: Secondary | ICD-10-CM | POA: Diagnosis not present

## 2021-11-27 DIAGNOSIS — Z85118 Personal history of other malignant neoplasm of bronchus and lung: Secondary | ICD-10-CM | POA: Insufficient documentation

## 2021-11-27 DIAGNOSIS — G4734 Idiopathic sleep related nonobstructive alveolar hypoventilation: Secondary | ICD-10-CM

## 2021-11-27 DIAGNOSIS — C3491 Malignant neoplasm of unspecified part of right bronchus or lung: Secondary | ICD-10-CM

## 2021-11-27 NOTE — Patient Instructions (Signed)
Plan to get the RSV vaccine, the influenza vaccine, and COVID booster between now and beginning of November  Follow up in 6 months

## 2021-11-27 NOTE — Progress Notes (Signed)
Karen Tate, [place] [address]   CLINIC:  Medical Oncology/Hematology  PCP:  Neale Burly, MD [place] / [place] [id number] [phone]   REASON FOR VISIT:  Follow-up for stage I right lung adenocarcinoma and IDA  PRIOR THERAPY: Right upper lobectomy on 05/13/2019  NGS Results: not done  CURRENT THERAPY: Intermittent IV Iron last on 09/22/2020  BRIEF ONCOLOGIC HISTORY:  Oncology History  Non-small cell cancer of right lung ([place])  06/29/2019 Initial Diagnosis   Non-small cell cancer of right lung ([place])   06/29/2019 Cancer Staging   Staging form: Lung, AJCC 8th Edition - Clinical stage from 06/29/2019: Stage IA2 (cT1b, cN0, cM0) - Signed by Derek Jack, MD on 06/29/2019     CANCER STAGING:  Cancer Staging  Non-small cell cancer of right lung [facility]) Staging form: Lung, AJCC 8th Edition - Clinical stage from 06/29/2019: Stage IA2 (cT1b, cN0, cM0) - Signed by Derek Jack, MD on 06/29/2019   INTERVAL HISTORY:  Karen Tate, a 76 y.o. female, returns for follow-up of lung cancer and anemia.  Reports energy levels of 60%.  Chronic cough and shortness of breath have been stable.  Denies any bleeding per rectum or melena.  She felt better after last Venofer infusion on 08/24/2021.  REVIEW OF SYSTEMS:  Review of Systems  Constitutional:  Negative for appetite change and fatigue.  HENT:   Negative for nosebleeds.   Respiratory:  Positive for cough and shortness of breath. Negative for hemoptysis.   Gastrointestinal:  Negative for blood in stool.  Genitourinary:  Negative for hematuria.   Neurological:  Negative for dizziness.  Hematological:  Does not bruise/bleed easily.  All other systems reviewed and are negative.   PAST MEDICAL/SURGICAL HISTORY:  Past Medical History:  Diagnosis Date   Anxiety neurosis    Arthritis    Asthma    CAD (coronary artery disease)    a. 08/2017: abnormal nuc-> sent directly to  [facility], NSTEMI, s/p CABG (left internal mammary artery to left anterior descending, sequential saphenous vein graft to ramus intermedius branches 1 and 2).   Carotid artery disease (HCC)    Chronic anemia    Dizziness    GERD (gastroesophageal reflux disease)    Glaucoma    Heart disease    Hiatal hernia    Hyperlipemia    Hypertension    Ischemic cardiomyopathy    Joint pain    Mitral regurgitation    Postoperative atrial fibrillation ([place]) 08/2017   Pulmonary embolus ([place])    Type 2 diabetes mellitus ([place])    Past Surgical History:  Procedure Laterality Date   BACK SURGERY  2016   CORONARY ARTERY BYPASS GRAFT N/A 09/18/2017   Procedure: CORONARY ARTERY BYPASS GRAFTING (CABG);  Surgeon: [name], MD;  Location: [place];  Service: Open Heart Surgery;  Laterality: N/A;  Saphenous vein harvest. LIMA to LAD Saphenous vein (sequential) ramus 1 & 2   EYE SURGERY Bilateral    "surgery for glaucoma"   HERNIA REPAIR     INTERCOSTAL NERVE BLOCK Right 05/13/2019   Procedure: Intercostal Nerve Block;  Surgeon: [name], MD;  Location: [facility] OR;  Service: Thoracic;  Laterality: Right;   LEFT HEART CATH AND CORONARY ANGIOGRAPHY N/A 09/15/2017   Procedure: LEFT HEART CATH AND CORONARY ANGIOGRAPHY;  Surgeon: Lorretta Harp, MD;  Location: [facility];  Service: Cardiovascular;  Laterality: N/A;   LYMPH NODE DISSECTION Right  05/13/2019   Procedure: Lymph Node Dissection;  Surgeon: [name], MD;  Location: [place];  Service: Thoracic;  Laterality: Right;   ROTATOR CUFF REPAIR Left    x2   SHOULDER SURGERY     LEFT   TEE WITHOUT CARDIOVERSION N/A 09/18/2017   Procedure: TRANSESOPHAGEAL ECHOCARDIOGRAM (TEE);  Surgeon: [name], MD;  Location: [facility];  Service: Open Heart Surgery;  Laterality: N/A;   TOTAL KNEE ARTHROPLASTY     LEFT   VAGINAL HYSTERECTOMY     partial    SOCIAL HISTORY:  Social History   Socioeconomic History   Marital  status: Married    Spouse name: [name]   Number of children: 5   Years of education: Not on file   Highest education level: Not on file  Occupational History   Not on file  Tobacco Use   Smoking status: Former    Packs/day: 0.50    Years: 35.00    Total pack years: 17.50    Types: Cigarettes    Quit date: 03/25/1978    Years since quitting: 43.7   Smokeless tobacco: Never   Tobacco comments:    quit more than 30 years ago  Vaping Use   Vaping Use: Never used  Substance and Sexual Activity   Alcohol use: No   Drug use: No   Sexual activity: Not on file  Other Topics Concern   Not on file  Social History Narrative   Not on file   Social Determinants of Health   Financial Resource Strain: Low Risk  (06/29/2019)   Overall Financial Resource Strain (CARDIA)    Difficulty of Paying Living Expenses: Not hard at all  Food Insecurity: No Food Insecurity (06/29/2019)   Hunger Vital Sign    Worried About Running Out of Food in the Last Year: Never true    Ran Out of Food in the Last Year: Never true  Transportation Needs: No Transportation Needs (06/29/2019)   PRAPARE - Hydrologist (Medical): No    Lack of Transportation (Non-Medical): No  Physical Activity: Inactive (06/29/2019)   Exercise Vital Sign    Days of Exercise per Week: 0 days    Minutes of Exercise per Session: 0 min  Stress: No Stress Concern Present (06/29/2019)   [place]    Feeling of Stress : Not at all  Social Connections: Moderately Integrated (06/29/2019)   Social Connection and Isolation Panel [NHANES]    Frequency of Communication with Friends and Family: More than three times a week    Frequency of Social Gatherings with Friends and Family: More than three times a week    Attends Religious Services: More than 4 times per year    Active Member of [employer] or Organizations: No    Attends Archivist Meetings: Never     Marital Status: Married  Human resources officer Violence: Not At Risk (06/29/2019)   Humiliation, Afraid, Rape, and Kick questionnaire    Fear of Current or Ex-Partner: No    Emotionally Abused: No    Physically Abused: No    Sexually Abused: No    FAMILY HISTORY:  Family History  Problem Relation Age of Onset   Diabetes Father    Heart disease Father    Breast cancer Mother    Congestive Heart Failure Brother    Anemia Daughter    Seizures Daughter    Migraines Daughter    Diabetes  Daughter    Hypertension Daughter    Bladder Cancer Son    Congestive Heart Failure Son    Congestive Heart Failure Son     CURRENT MEDICATIONS:  Current Outpatient Medications  Medication Sig Dispense Refill   acetaminophen (TYLENOL) 500 MG tablet Take 1,000 mg by mouth every 6 (six) hours as needed for moderate pain.     albuterol (VENTOLIN HFA) 108 (90 Base) MCG/ACT inhaler Inhale 2 puffs into the lungs every 6 (six) hours as needed. 18 g 11   ALPRAZolam (XANAX) 0.5 MG tablet Take 0.5 mg by mouth 2 (two) times daily.     amLODipine (NORVASC) 10 MG tablet Take 10 mg by mouth daily.     aspirin 81 MG tablet Take 1 tablet (81 mg total) by mouth daily.     atorvastatin (LIPITOR) 80 MG tablet Take 1 tablet (80 mg total) by mouth daily at 6 PM. 90 tablet 3   carvedilol (COREG) 25 MG tablet Take 1 tablet by mouth 2 (two) times daily.     diclofenac Sodium (VOLTAREN) 1 % GEL Apply 2 g topically 4 (four) times daily.     fish oil-omega-3 fatty acids 1000 MG capsule Take 1 g by mouth 3 (three) times daily.      fluticasone-salmeterol (ADVAIR) 250-50 MCG/ACT AEPB Inhale 1 puff into the lungs in the morning and at bedtime. Advair 250/50 one puff 2 times/ day 60 each 11   folic acid (FOLVITE) 1 MG tablet TAKE 1 TABLET EVERY DAY 90 tablet 3   furosemide (LASIX) 20 MG tablet Take 2 tablets (40 mg total) by mouth in the morning. & 20 mg in the late afternoon 90 tablet 4   latanoprost (XALATAN) 0.005 % ophthalmic  solution Place 1 drop into both eyes at bedtime.     lisinopril (ZESTRIL) 40 MG tablet TAKE 1 TABLET EVERY DAY ( DOSE INCREASE ) 90 tablet 3   magnesium oxide (MAG-OX) 400 MG tablet Take 1 tablet (400 mg total) by mouth 2 (two) times daily. 180 tablet 3   meclizine (ANTIVERT) 25 MG tablet Take 25 mg by mouth 3 (three) times daily as needed for dizziness.      metFORMIN (GLUCOPHAGE) 1000 MG tablet Take 1,000 mg by mouth 2 (two) times daily.     Multiple Vitamins-Minerals (CENTRUM SILVER 50+WOMEN) TABS Take 1 tablet by mouth daily.      omeprazole (PRILOSEC) 40 MG capsule Take 40 mg by mouth daily.     potassium chloride SA (KLOR-CON M) 20 MEQ tablet Take 2 tablets (40 mEq total) by mouth daily. 08/30/2021 dose increase 60 tablet 4   tiZANidine (ZANAFLEX) 4 MG tablet Take 4 mg by mouth at bedtime as needed.     traMADol (ULTRAM) 50 MG tablet Take 50 mg by mouth 3 (three) times daily as needed.     vitamin B-12 (CYANOCOBALAMIN) 500 MCG tablet Take 500 mcg by mouth daily.     No current facility-administered medications for this visit.    ALLERGIES:  Allergies  Allergen Reactions   Beta Adrenergic Blockers Other (See Comments)    Dropped heart rate to the 40's   Calcium Channel Blockers Other (See Comments)    Dropped HR into the 40's   Naproxen Hives    PHYSICAL EXAM:  Performance status (ECOG): 1 - Symptomatic but completely ambulatory  Vitals:   11/27/21 0951  BP: (!) 126/56  Pulse: 62  Resp: 17  Temp: (!) 97 F (36.1 C)  SpO2: 98%  Wt Readings from Last 3 Encounters:  11/27/21 165 lb (74.8 kg)  11/27/21 165 lb 6.4 oz (75 kg)  10/21/21 178 lb (80.7 kg)   Physical Exam Vitals reviewed.  Constitutional:      Appearance: Normal appearance.     Comments: In wheelchair  Cardiovascular:     Rate and Rhythm: Normal rate and regular rhythm.     Pulses: Normal pulses.     Heart sounds: Normal heart sounds.  Pulmonary:     Effort: Pulmonary effort is normal.     Breath  sounds: Normal breath sounds.  Neurological:     General: No focal deficit present.     Mental Status: She is alert and oriented to person, place, and time.  Psychiatric:        Mood and Affect: Mood normal.        Behavior: Behavior normal.      LABORATORY DATA:  I have reviewed the labs as listed.     Latest Ref Rng & Units 11/07/2021    9:11 AM 10/21/2021    1:25 PM 08/01/2021   10:01 PM  CBC  WBC 4.0 - 10.5 K/uL 5.9  5.0  4.3   Hemoglobin 12.0 - 15.0 g/dL 10.7  9.9  9.6   Hematocrit 36.0 - 46.0 % 35.6  32.1  32.1   Platelets 150 - 400 K/uL 241  179  204       Latest Ref Rng & Units 11/07/2021    9:11 AM 10/21/2021    1:25 PM 09/07/2021   10:00 AM  CMP  Glucose 70 - 99 mg/dL 174  174  176   BUN 8 - 23 mg/dL 20  23  22    Creatinine 0.44 - 1.00 mg/dL 1.33  1.34  1.12   Sodium 135 - 145 mmol/L 144  143  144   Potassium 3.5 - 5.1 mmol/L 3.8  5.2  4.3   Chloride 98 - 111 mmol/L 106  108  105   CO2 22 - 32 mmol/L 30  29  33   Calcium 8.9 - 10.3 mg/dL 9.6  9.2  9.2   Total Protein 6.5 - 8.1 g/dL 7.7  7.3    Total Bilirubin 0.3 - 1.2 mg/dL 0.5  0.6    Alkaline Phos 38 - 126 U/L 53  58    AST 15 - 41 U/L 16  14    ALT 0 - 44 U/L 14  14      DIAGNOSTIC IMAGING:  I have independently reviewed the scans and discussed with the patient. CT Chest W Contrast  Result Date: 11/08/2021 CLINICAL DATA:  Non-small-cell lung cancer. Restaging. * Tracking Code: BO * EXAM: CT CHEST WITH CONTRAST TECHNIQUE: Multidetector CT imaging of the chest was performed during intravenous contrast administration. RADIATION DOSE REDUCTION: This exam was performed according to the departmental dose-optimization program which includes automated exposure control, adjustment of the mA and/or kV according to patient size and/or use of iterative reconstruction technique. CONTRAST:  51mL OMNIPAQUE IOHEXOL 300 MG/ML  SOLN COMPARISON:  07/31/2021 FINDINGS: Cardiovascular: The heart size is normal. No substantial  pericardial effusion. Coronary artery calcification is evident. Status post CABG. Mediastinum/Nodes: Mediastinal lymphadenopathy again noted. Index Karen right paratracheal node measured previously at 12 mm is 12 mm again today on 43/2. Index right paratracheal node measured previously at 10 mm is 10 mm again today on image 48/2. Index subcarinal node measured previously at 9 mm is 10 mm today on image 66/2.  There is no hilar lymphadenopathy. The esophagus has normal imaging features. There is no axillary lymphadenopathy. Lungs/Pleura: Volume loss with parahilar scarring in the upper right lung is similar to prior and compatible with right upper lobectomy with treatment related changes. 7 mm 6 mm right lung nodule on image 50/4 is unchanged. 6 mm left upper lobe perifissural nodule identified previously has resolved in the interval. No new suspicious pulmonary nodule or mass. Small right pleural effusion is stable. Upper Abdomen: Unremarkable. Musculoskeletal: No worrisome lytic or sclerotic osseous abnormality. IMPRESSION: 1. No new or progressive findings on today's study. 2. Interval resolution of the 6 mm left upper lobe perifissural nodule identified as new on the prior study. 3. Mild mediastinal lymphadenopathy is unchanged in the interval. 4. Stable appearance of post treatment changes in the right hemithorax. 5. Stable small right pleural effusion. 6. Aortic Atherosclerosis (ICD10-I70.0). Electronically Signed   By: Misty Stanley M.D.   On: 11/08/2021 11:56     ASSESSMENT:  1.  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 -PET CT scan on 04/26/2019 showed mildly hypermetabolic 1.1 cm right upper lobe pulmonary nodule with SUV 2.4.  No findings of adenopathy or metastatic disease. -Right upper lobectomy on 05/13/2019  shows 1.3 cm invasive adenocarcinoma, negative for visceral pleural invasion, LVI negative, margins negative.  Lymph nodes from stations 7, 11 R, 10 R, 4R, 12R are negative. -CT chest surveillance every 6 months for 2 to 3 years was recommended followed by noncontrast CT annually. -CT chest on 10/27/2019 showed surgical changes in the right upper lobe lobectomy with no findings of recurrence.  2 small indeterminate right lower lobe pulmonary nodules, follow-up recommended. -CT chest on 05/04/2020 with post right upper lobe resection with no evidence of recurrence or metastasis.  Clearing of bilateral pleural effusions.   2.  Normocytic anemia: -Her previous hemoglobin was 7.6 with MCV of 88. -She has mild degree of chronic kidney disease which is likely contributing to her anemia. -Colonoscopy earlier this year was reportedly normal.     3.  Weight loss: -She lost about 20-25 pounds since her surgery. -She is slowly gaining back.  Her appetite is improving.   PLAN:  1.  Stage I (PT 1 BPN 0) right upper lobe adenocarcinoma: - CT chest (11/07/2021): No new or progressive findings.  Interval resolution of 6 mm left upper lobe nodule.  Mild mediastinal adenopathy is unchanged.  Stable small right pleural effusion. - No evidence of progression or recurrence.  RTC 6 months with repeat CT scan.   2.  Normocytic anemia: - She received Venofer, last dose on 08/24/2021. - Ferritin is 417 and percent saturation 19.  Hemoglobin improved to 10.7. - No indication for parenteral iron therapy at this time.  Start iron tablet daily and take stool softener if constipation.  We will repeat labs in 6 months.   3.  Vitamin B12 deficiency: - Continue B12 tablet daily.  B12 level is 850.   Orders placed this encounter:  Orders Placed This Encounter  Procedures   [place], MD [place] [phone]

## 2021-11-27 NOTE — Progress Notes (Signed)
[facility]  Chief Complaint  Patient presents with   Follow-up    Breathing doing well.     Past Surgical History:  She  has a past surgical history that includes Hernia repair; Total knee arthroplasty; Shoulder surgery; LEFT HEART CATH AND CORONARY ANGIOGRAPHY (N/A, 09/15/2017); Coronary artery bypass graft (N/A, 09/18/2017); TEE without cardioversion (N/A, 09/18/2017); Back surgery (2016); Eye surgery (Bilateral); Vaginal hysterectomy; Rotator cuff repair (Left); Lymph node dissection (Right, 05/13/2019); and Intercostal nerve block (Right, 05/13/2019).  Past Medical History:  OA, CAD, Anemia, GERD, Glaucoma, Hiatal hernia, HLD, HTN, ischemic CM, CAD s/p CABG, Post op a fib, PE, DM type 2, NSCLC s/p RULectomy  Constitutional:  BP 124/60 (BP Location: Left Arm, Patient Position: Sitting)   Pulse 62   Temp 98 F (36.7 C) (Temporal)   Ht 5\' 5"  (1.651 m)   Wt 165 lb (74.8 kg)   SpO2 91% Comment: ra  BMI 27.46 kg/m   Brief Summary:  Karen Tate is a 76 y.o. female former smoker with with dyspnea.  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      Subjective:   She is here with her husband.  Breathing okay.  Not having much cough, wheeze, or sputum. Not needing albuterol.  Advair helps.  Sleeping okay.  Keeps up with light activity at home.  Asked about vaccines for this season.    Had CT chest in August that looked stable.  Physical Exam:   Appearance - well kempt, in wheelchair  ENMT - no sinus tenderness, no oral exudate, no LAN, Mallampati 3 airway, no stridor  Respiratory - equal breath sounds bilaterally, no wheezing or rales  CV - s1s2 regular rate and rhythm, no murmurs  Ext - no clubbing, no edema  Skin - no rashes  Psych - normal mood and affect     Pulmonary testing:  PFT 05/12/19 >> FEV1 1.02 (59%), FEV1% 79, TLC 3.07  (60%), FEV1% 63% PFT 01/23/21 >> FEV1 0.98 (56%), FEV1% 82, TLC 2.72 (52%), DLCO 27%  Chest Imaging:  CT angio chest 07/21/20 >> CHF pattern CT chest 12/18/20 >> s/p Rt upper lobectomy, trace Rt effusion, fatty liver, atherosclerosis CT chest 11/08/21 >> resolution of LUL nodule  Sleep Tests:  ONO with RA 09/08/20 >> test time 6 hrs 2 min.  Basal SpO2 99%, low SpO2 96%.  Cardiac Tests:  Echo 07/21/20 >> EF 60 to 65%, grade 2 DD, RVSP 61.3 mmHg, mod MR, mod TR  Social History:  She  reports that she quit smoking about 43 years ago. Her smoking use included cigarettes. She has a 17.50 pack-year smoking history. She has never used smokeless tobacco. She reports that she does not drink alcohol and does not use drugs.  Family History:  Her family history includes Anemia in her daughter; Bladder Cancer in her son; Breast cancer in her mother; Congestive Heart Failure in her brother, son, and son; Diabetes in her daughter and father; Heart disease in her father; Hypertension in her daughter; Migraines in her daughter; Seizures in her daughter.     Assessment/Plan:   COPD with asthma. - breo copay was too expensive - continue advair 250 one puff bid - prn albuterol - reviewed her vaccine schedule  Chronic respiratory failure with hypoxia. - goal SpO2 > 90% - 2 liters with exertion and sleep at night  Stage 1 Rt upper lung adenocarcinoma  s/p lobectomy 05/13/19. - followed by Dr. Delton Coombes with [facility]  Coronary artery disease, Valvular heart disease, Chronic diastolic CHF. - followed by Dr. Carlyle Dolly with [facility]  Time Spent Involved in Patient Care on Day of Examination:  35 minutes  Follow up:   Patient Instructions  Plan to get the RSV vaccine, the influenza vaccine, and COVID booster between now and beginning of November  Follow up in 6 months  Medication List:   Allergies as of 11/27/2021       Reactions   Beta Adrenergic Blockers Other (See  Comments)   Dropped heart rate to the 40's   Calcium Channel Blockers Other (See Comments)   Dropped HR into the 40's   Naproxen Hives        Medication List        Accurate as of November 27, 2021 11:28 AM. If you have any questions, ask your nurse or doctor.          acetaminophen 500 MG tablet Commonly known as: TYLENOL Take 1,000 mg by mouth every 6 (six) hours as needed for moderate pain.   albuterol 108 (90 Base) MCG/ACT inhaler Commonly known as: VENTOLIN HFA Inhale 2 puffs into the lungs every 6 (six) hours as needed.   ALPRAZolam 0.5 MG tablet Commonly known as: XANAX Take 0.5 mg by mouth 2 (two) times daily.   amLODipine 10 MG tablet Commonly known as: NORVASC Take 10 mg by mouth daily.   aspirin EC 81 MG tablet Take 1 tablet (81 mg total) by mouth daily.   atorvastatin 80 MG tablet Commonly known as: LIPITOR Take 1 tablet (80 mg total) by mouth daily at 6 PM.   carvedilol 25 MG tablet Commonly known as: COREG Take 1 tablet by mouth 2 (two) times daily.   Centrum Silver 50+Women Tabs Take 1 tablet by mouth daily.   diclofenac Sodium 1 % Gel Commonly known as: VOLTAREN Apply 2 g topically 4 (four) times daily.   fish oil-omega-3 fatty acids 1000 MG capsule Take 1 g by mouth 3 (three) times daily.   fluticasone-salmeterol 250-50 MCG/ACT Aepb Commonly known as: ADVAIR Inhale 1 puff into the lungs in the morning and at bedtime. Advair 250/50 one puff 2 times/ day   folic acid 1 MG tablet Commonly known as: FOLVITE TAKE 1 TABLET EVERY DAY   furosemide 20 MG tablet Commonly known as: LASIX Take 2 tablets (40 mg total) by mouth in the morning. & 20 mg in the late afternoon   latanoprost 0.005 % ophthalmic solution Commonly known as: XALATAN Place 1 drop into both eyes at bedtime.   lisinopril 40 MG tablet Commonly known as: ZESTRIL TAKE 1 TABLET EVERY DAY ( DOSE INCREASE )   magnesium oxide 400 MG tablet Commonly known as: MAG-OX Take 1  tablet (400 mg total) by mouth 2 (two) times daily.   meclizine 25 MG tablet Commonly known as: ANTIVERT Take 25 mg by mouth 3 (three) times daily as needed for dizziness.   metFORMIN 1000 MG tablet Commonly known as: GLUCOPHAGE Take 1,000 mg by mouth 2 (two) times daily.   omeprazole 40 MG capsule Commonly known as: PRILOSEC Take 40 mg by mouth daily.   potassium chloride SA 20 MEQ tablet Commonly known as: KLOR-CON M Take 2 tablets (40 mEq total) by mouth daily. 08/30/2021 dose increase   tiZANidine 4 MG tablet Commonly known as: ZANAFLEX Take 4 mg by mouth at bedtime as needed.  traMADol 50 MG tablet Commonly known as: ULTRAM Take 50 mg by mouth 3 (three) times daily as needed.   vitamin B-12 500 MCG tablet Commonly known as: CYANOCOBALAMIN Take 500 mcg by mouth daily.        Signature:  Chesley Mires, MD [place] Pager - [phone] 11/27/2021, 11:28 AM

## 2021-11-27 NOTE — Patient Instructions (Addendum)
[place] at [facility] Discharge Instructions   You were seen and examined today by Dr. Delton Coombes.  He reviewed the results of your lab work and CT scan. All results are normal/stable.  Start taking iron pills over the counter. The dose of the iron pill should be 325 mg. Take one pill daily. If it causes constipation, you may take a stool softener daily with iron tablet.   We will see you back in 6 months. We will repeat blood work and CT scan prior to your next visit.    Thank you for choosing [place] at [facility] to provide your oncology and hematology care.  To afford each patient quality time with our provider, please arrive at least 15 minutes before your scheduled appointment time.   If you have a lab appointment with the [place] please come in thru the Main Entrance and check in at the main information desk.  You need to re-schedule your appointment should you arrive 10 or more minutes late.  We strive to give you quality time with our providers, and arriving late affects you and other patients whose appointments are after yours.  Also, if you no show three or more times for appointments you may be dismissed from the clinic at the providers discretion.     Again, thank you for choosing [facility].  Our hope is that these requests will decrease the amount of time that you wait before being seen by our physicians.       _____________________________________________________________  Should you have questions after your visit to [facility], please contact our office at [phone] and follow the prompts.  Our office hours are 8:00 a.m. and 4:30 p.m. Monday - Friday.  Please note that voicemails left after 4:00 p.m. may not be returned until the following business day.  We are closed weekends and major holidays.  You do have access to a nurse 24-7, just call the main number to the clinic [phone]  and do not press any options, hold on the line and a nurse will answer the phone.    For prescription refill requests, have your pharmacy contact our office and allow 72 hours.    Due to Covid, you will need to wear a mask upon entering the hospital. If you do not have a mask, a mask will be given to you at the Main Entrance upon arrival. For doctor visits, patients may have 1 support person age 34 or older with them. For treatment visits, patients can not have anyone with them due to social distancing guidelines and our immunocompromised population.

## 2021-12-14 DIAGNOSIS — U071 COVID-19: Secondary | ICD-10-CM | POA: Diagnosis not present

## 2021-12-14 DIAGNOSIS — J441 Chronic obstructive pulmonary disease with (acute) exacerbation: Secondary | ICD-10-CM | POA: Diagnosis not present

## 2022-01-11 ENCOUNTER — Inpatient Hospital Stay: Payer: PPO | Attending: Hematology | Admitting: Cardiology

## 2022-01-11 ENCOUNTER — Encounter: Payer: Self-pay | Admitting: Cardiology

## 2022-01-11 VITALS — BP 130/64 | HR 64 | Ht 65.0 in | Wt 177.6 lb

## 2022-01-11 DIAGNOSIS — I10 Essential (primary) hypertension: Secondary | ICD-10-CM | POA: Diagnosis not present

## 2022-01-11 DIAGNOSIS — I5032 Chronic diastolic (congestive) heart failure: Secondary | ICD-10-CM

## 2022-01-11 DIAGNOSIS — I6523 Occlusion and stenosis of bilateral carotid arteries: Secondary | ICD-10-CM | POA: Diagnosis not present

## 2022-01-11 DIAGNOSIS — I2510 Atherosclerotic heart disease of native coronary artery without angina pectoris: Secondary | ICD-10-CM

## 2022-01-11 DIAGNOSIS — E782 Mixed hyperlipidemia: Secondary | ICD-10-CM | POA: Diagnosis not present

## 2022-01-11 NOTE — Progress Notes (Signed)
Clinical Summary Ms. Gerard is a 76 y.o.female seen today for follow up of the following medical problems.    1. CAD - prior CABG in 08/2017 (left internal mammary artery to left anterior descending, sequential saphenous vein graft to ramus intermedius branches 1 and 2). She presented with an NSTEMI -  10/2018 echo LVEF 60-65%     - no recent chest pains. Chronic SOB unchanged - compliant with meds         2. Carotid stenosis - 6/20219 carotid US: RICA 8-29%, LICA 93-71%  69/6789 RICA 3-81%, LICA 01-75% 12-2583 RICA 2-77%, LICA 82-42% - 05/5359 carotid US RICA 4-43%, LICA 15-40%   3. Hyperlipidemia - compliant with meds - last labs with pcp  4. HTN -compliant with meds     5. Mitral regurgitation - 10/2018 echo moderate MR - 06/2020 echo mod MR, mod TR   6. Chronic diastolic HF - 0/8676 admission with SOB, thought to be volume overloaded and diuresed - 06/2020 echo LVEF 60-65%, grade II dd, mild RV dysfunction, PASP 61 mmHg       08/01/21 ER visit mild pulm edema. BNP 315 - taking lasix 20mg  bid. No LE edema, no SOB/DOE - home weight 181 lbs, had been 175 lbs about 2 months ago.     - taking lasix 40mg  daily. Some ongoing LE edema but higher diuretic dosing caused cramping   7. Pulmonary HTN - 06/2020 echo LVEF 60-65%, grade II dd, mild RV dysfunction, PASP 61 mmHg     8. Lung cancer - history of stage Ia nonsmall cell lung CA - prior RUL lobectomy   9. History of PE   10. COPD - 04/2019 PFTs moderate obstruction, severe restriction, moderate diffusion defect - followed by Dr Halford Chessman pulmonary Past Medical History:  Diagnosis Date   Anxiety neurosis    Arthritis    Asthma    CAD (coronary artery disease)    a. 08/2017: abnormal nuc-> sent directly to [facility], NSTEMI, s/p CABG (left internal mammary artery to left anterior descending, sequential saphenous vein graft to ramus intermedius branches 1 and 2).   Carotid artery disease (HCC)    Chronic anemia     Dizziness    GERD (gastroesophageal reflux disease)    Glaucoma    Heart disease    Hiatal hernia    Hyperlipemia    Hypertension    Ischemic cardiomyopathy    Joint pain    Mitral regurgitation    Postoperative atrial fibrillation ([place]) 08/2017   Pulmonary embolus (HCC)    Type 2 diabetes mellitus (HCC)      Allergies  Allergen Reactions   Beta Adrenergic Blockers Other (See Comments)    Dropped heart rate to the 40's   Calcium Channel Blockers Other (See Comments)    Dropped HR into the 40's   Naproxen Hives     Current Outpatient Medications  Medication Sig Dispense Refill   acetaminophen (TYLENOL) 500 MG tablet Take 1,000 mg by mouth every 6 (six) hours as needed for moderate pain.     albuterol (VENTOLIN HFA) 108 (90 Base) MCG/ACT inhaler Inhale 2 puffs into the lungs every 6 (six) hours as needed. 18 g 11   ALPRAZolam (XANAX) 0.5 MG tablet Take 0.5 mg by mouth 2 (two) times daily.     amLODipine (NORVASC) 10 MG tablet Take 10 mg by mouth daily.     aspirin 81 MG tablet Take 1 tablet (81 mg total) by mouth daily.  atorvastatin (LIPITOR) 80 MG tablet Take 1 tablet (80 mg total) by mouth daily at 6 PM. 90 tablet 3   carvedilol (COREG) 25 MG tablet Take 1 tablet by mouth 2 (two) times daily.     diclofenac Sodium (VOLTAREN) 1 % GEL Apply 2 g topically 4 (four) times daily.     fish oil-omega-3 fatty acids 1000 MG capsule Take 1 g by mouth 3 (three) times daily.      fluticasone-salmeterol (ADVAIR) 250-50 MCG/ACT AEPB Inhale 1 puff into the lungs in the morning and at bedtime. Advair 250/50 one puff 2 times/ day 60 each 11   folic acid (FOLVITE) 1 MG tablet TAKE 1 TABLET EVERY DAY 90 tablet 3   furosemide (LASIX) 20 MG tablet Take 2 tablets (40 mg total) by mouth in the morning. & 20 mg in the late afternoon 90 tablet 4   latanoprost (XALATAN) 0.005 % ophthalmic solution Place 1 drop into both eyes at bedtime.     lisinopril (ZESTRIL) 40 MG tablet TAKE 1 TABLET EVERY DAY  ( DOSE INCREASE ) 90 tablet 3   magnesium oxide (MAG-OX) 400 MG tablet Take 1 tablet (400 mg total) by mouth 2 (two) times daily. 180 tablet 3   meclizine (ANTIVERT) 25 MG tablet Take 25 mg by mouth 3 (three) times daily as needed for dizziness.      metFORMIN (GLUCOPHAGE) 1000 MG tablet Take 1,000 mg by mouth 2 (two) times daily.     Multiple Vitamins-Minerals (CENTRUM SILVER 50+WOMEN) TABS Take 1 tablet by mouth daily.      omeprazole (PRILOSEC) 40 MG capsule Take 40 mg by mouth daily.     potassium chloride SA (KLOR-CON M) 20 MEQ tablet Take 2 tablets (40 mEq total) by mouth daily. 08/30/2021 dose increase 60 tablet 4   tiZANidine (ZANAFLEX) 4 MG tablet Take 4 mg by mouth at bedtime as needed.     traMADol (ULTRAM) 50 MG tablet Take 50 mg by mouth 3 (three) times daily as needed.     vitamin B-12 (CYANOCOBALAMIN) 500 MCG tablet Take 500 mcg by mouth daily.     No current facility-administered medications for this visit.     Past Surgical History:  Procedure Laterality Date   BACK SURGERY  2016   CORONARY ARTERY BYPASS GRAFT N/A 09/18/2017   Procedure: CORONARY ARTERY BYPASS GRAFTING (CABG);  Surgeon: [name], MD;  Location: [place];  Service: Open Heart Surgery;  Laterality: N/A;  Saphenous vein harvest. LIMA to LAD Saphenous vein (sequential) ramus 1 & 2   EYE SURGERY Bilateral    "surgery for glaucoma"   HERNIA REPAIR     INTERCOSTAL NERVE BLOCK Right 05/13/2019   Procedure: Intercostal Nerve Block;  Surgeon: [name], MD;  Location: [facility] OR;  Service: Thoracic;  Laterality: Right;   LEFT HEART CATH AND CORONARY ANGIOGRAPHY N/A 09/15/2017   Procedure: LEFT HEART CATH AND CORONARY ANGIOGRAPHY;  Surgeon: Lorretta Harp, MD;  Location: [place] [facility] CV LAB;  Service: Cardiovascular;  Laterality: N/A;   LYMPH NODE DISSECTION Right 05/13/2019   Procedure: Lymph Node Dissection;  Surgeon: [name], MD;  Location: [place];  Service: Thoracic;  Laterality:  Right;   ROTATOR CUFF REPAIR Left    x2   SHOULDER SURGERY     LEFT   TEE WITHOUT CARDIOVERSION N/A 09/18/2017   Procedure: TRANSESOPHAGEAL ECHOCARDIOGRAM (TEE);  Surgeon: [name], MD;  Location: [place];  Service: Open Heart Surgery;  Laterality: N/A;  TOTAL KNEE ARTHROPLASTY     LEFT   VAGINAL HYSTERECTOMY     partial     Allergies  Allergen Reactions   Beta Adrenergic Blockers Other (See Comments)    Dropped heart rate to the 40's   Calcium Channel Blockers Other (See Comments)    Dropped HR into the 40's   Naproxen Hives      Family History  Problem Relation Age of Onset   Diabetes Father    Heart disease Father    Breast cancer Mother    Congestive Heart Failure Brother    Anemia Daughter    Seizures Daughter    Migraines Daughter    Diabetes Daughter    Hypertension Daughter    Bladder Cancer Son    Congestive Heart Failure Son    Congestive Heart Failure Son      Social History Ms. Arrambide reports that she quit smoking about 43 years ago. Her smoking use included cigarettes. She has a 17.50 pack-year smoking history. She has never used smokeless tobacco. Ms. Dietzman reports no history of alcohol use.   Review of Systems CONSTITUTIONAL: No weight loss, fever, chills, weakness or fatigue.  HEENT: Eyes: No visual loss, blurred vision, double vision or yellow sclerae.No hearing loss, sneezing, congestion, runny nose or sore throat.  SKIN: No rash or itching.  CARDIOVASCULAR: per hpi RESPIRATORY: chronic SOB GASTROINTESTINAL: No anorexia, nausea, vomiting or diarrhea. No abdominal pain or blood.  GENITOURINARY: No burning on urination, no polyuria NEUROLOGICAL: No headache, dizziness, syncope, paralysis, ataxia, numbness or tingling in the extremities. No change in bowel or bladder control.  MUSCULOSKELETAL: No muscle, back pain, joint pain or stiffness.  LYMPHATICS: No enlarged nodes. No history of splenectomy.  PSYCHIATRIC: No history of  depression or anxiety.  ENDOCRINOLOGIC: No reports of sweating, cold or heat intolerance. No polyuria or polydipsia.  Marland Kitchen   Physical Examination Today's Vitals   01/11/22 1409  BP: 130/64  Pulse: 64  SpO2: 91%  Weight: 177 lb 9.6 oz (80.6 kg)  Height: 5\' 5"  (1.651 m)   Body mass index is 29.55 kg/m.  Gen: resting comfortably, no acute distress HEENT: no scleral icterus, pupils equal round and reactive, no palptable cervical adenopathy,  CV: RRR, no m/rg. +JVD Resp: Clear to auscultation bilaterally GI: abdomen is soft, non-tender, non-distended, normal bowel sounds, no hepatosplenomegaly MSK: extremities are warm, 1+bilateral LE edema Skin: warm, no rash Neuro:  no focal deficits Psych: appropriate affect   Diagnostic Studies  Echocardiogram 11/18/2018:  1. The left ventricle has normal systolic function with an ejection  fraction of 60-65%. The cavity size was normal. Left ventricular diastolic  parameters were normal.   2. The right ventricle has mildly reduced systolic function. The cavity  was normal. There is no increase in right ventricular wall thickness.  Right ventricular systolic pressure is moderately elevated with an  estimated pressure of 49.5 mmHg.   3. The aortic valve is tricuspid. Mild calcification of the aortic valve.  Mild to moderate aortic annular calcification noted.   4. The mitral valve is degenerative. Mild thickening of the mitral valve  leaflet. Mild calcification of the mitral valve leaflet. Mitral valve  regurgitation is moderate by color flow Doppler. The MR jet is eccentric  posteriorly directed.   5. The tricuspid valve is grossly normal.   6. The aorta is normal unless otherwise noted.      01/2020 carotid US Summary:  Right Carotid: Velocities in the right ICA are consistent with  a 1-39%  stenosis.                 The ECA appears >50% stenosed.   Left Carotid: Velocities in the left ICA are consistent with a 40-59%  stenosis.    Vertebrals:  Bilateral vertebral arteries demonstrate antegrade flow.  Subclavians: Normal flow hemodynamics were seen in bilateral subclavian               arteries.    06/2020 echo   IMPRESSIONS     1. Left ventricular ejection fraction, by estimation, is 60 to 65%. The  left ventricle has normal function. The left ventricle demonstrates  regional wall motion abnormalities (see scoring diagram/findings for  description). Left ventricular diastolic  parameters are consistent with Grade II diastolic dysfunction  (pseudonormalization).   2. Right ventricular systolic function is mildly reduced. The right  ventricular size is normal. There is severely elevated pulmonary artery  systolic pressure. The estimated right ventricular systolic pressure is  37.1 mmHg.   3. The mitral valve is abnormal. Moderate mitral valve regurgitation.   4. Tricuspid valve regurgitation is moderate.   5. The aortic valve is tricuspid. There is mild calcification of the  aortic valve. Aortic valve regurgitation is not visualized.   6. The inferior vena cava is normal in size with <50% respiratory  variability, suggesting right atrial pressure of 8 mmHg.    Assessment and Plan  1. CAD - no recent symptoms, continue current meds  2. HTN - bp at goal, continue current meds   3. Hyperlipidemia - request labs from pcp, continue current meds   5. Carotid stenosis - stable mild to moderate disease - we will repeat US Spring 2024   6. Pulmonary HTN - noted by recent echo. Most likely secondary to her chronic O2 dependent lung disease, left sided heart disease. I think the possibility of vasodilators having a role is quite small, would not pursue further testing at this time   7. Chronic diastolic HF - mild fluid overload, diuretic dosing limited by cramping. Encouraged to try to take additional lasix fwe times a week as tolerated.       Arnoldo Lenis, M.D

## 2022-01-11 NOTE — Patient Instructions (Signed)
Medication Instructions:  Continue all current medications.   Labwork: none  Testing/Procedures: none  Follow-Up: 6 months   Any Other Special Instructions Will Be Listed Below (If Applicable).   If you need a refill on your cardiac medications before your next appointment, please call your pharmacy.  

## 2022-01-13 DIAGNOSIS — U071 COVID-19: Secondary | ICD-10-CM | POA: Diagnosis not present

## 2022-01-13 DIAGNOSIS — J441 Chronic obstructive pulmonary disease with (acute) exacerbation: Secondary | ICD-10-CM | POA: Diagnosis not present

## 2022-01-23 ENCOUNTER — Encounter: Payer: Self-pay | Admitting: *Deleted

## 2022-02-13 DIAGNOSIS — J441 Chronic obstructive pulmonary disease with (acute) exacerbation: Secondary | ICD-10-CM | POA: Diagnosis not present

## 2022-02-13 DIAGNOSIS — U071 COVID-19: Secondary | ICD-10-CM | POA: Diagnosis not present

## 2022-02-24 ENCOUNTER — Inpatient Hospital Stay (HOSPITAL_COMMUNITY)
Admission: EM | Admit: 2022-02-24 | Discharge: 2022-03-03 | DRG: 377 | Disposition: A | Discharge status: Home / Self Care | Payer: PPO | Attending: Family Medicine | Admitting: Family Medicine

## 2022-02-24 ENCOUNTER — Other Ambulatory Visit: Payer: Self-pay

## 2022-02-24 ENCOUNTER — Encounter (HOSPITAL_COMMUNITY): Payer: Self-pay | Admitting: Emergency Medicine

## 2022-02-24 DIAGNOSIS — C3490 Malignant neoplasm of unspecified part of unspecified bronchus or lung: Secondary | ICD-10-CM | POA: Diagnosis not present

## 2022-02-24 DIAGNOSIS — Z803 Family history of malignant neoplasm of breast: Secondary | ICD-10-CM

## 2022-02-24 DIAGNOSIS — I5033 Acute on chronic diastolic (congestive) heart failure: Secondary | ICD-10-CM | POA: Diagnosis not present

## 2022-02-24 DIAGNOSIS — N1832 Chronic kidney disease, stage 3b: Secondary | ICD-10-CM | POA: Diagnosis present

## 2022-02-24 DIAGNOSIS — Z8052 Family history of malignant neoplasm of bladder: Secondary | ICD-10-CM

## 2022-02-24 DIAGNOSIS — Q438 Other specified congenital malformations of intestine: Secondary | ICD-10-CM | POA: Diagnosis not present

## 2022-02-24 DIAGNOSIS — I252 Old myocardial infarction: Secondary | ICD-10-CM

## 2022-02-24 DIAGNOSIS — I2510 Atherosclerotic heart disease of native coronary artery without angina pectoris: Secondary | ICD-10-CM | POA: Diagnosis not present

## 2022-02-24 DIAGNOSIS — Z85118 Personal history of other malignant neoplasm of bronchus and lung: Secondary | ICD-10-CM

## 2022-02-24 DIAGNOSIS — K921 Melena: Principal | ICD-10-CM | POA: Diagnosis present

## 2022-02-24 DIAGNOSIS — J4489 Other specified chronic obstructive pulmonary disease: Secondary | ICD-10-CM | POA: Diagnosis present

## 2022-02-24 DIAGNOSIS — E1165 Type 2 diabetes mellitus with hyperglycemia: Secondary | ICD-10-CM | POA: Diagnosis present

## 2022-02-24 DIAGNOSIS — R55 Syncope and collapse: Secondary | ICD-10-CM | POA: Diagnosis not present

## 2022-02-24 DIAGNOSIS — J9611 Chronic respiratory failure with hypoxia: Secondary | ICD-10-CM | POA: Diagnosis present

## 2022-02-24 DIAGNOSIS — I5032 Chronic diastolic (congestive) heart failure: Secondary | ICD-10-CM | POA: Diagnosis not present

## 2022-02-24 DIAGNOSIS — K219 Gastro-esophageal reflux disease without esophagitis: Secondary | ICD-10-CM | POA: Diagnosis present

## 2022-02-24 DIAGNOSIS — K625 Hemorrhage of anus and rectum: Secondary | ICD-10-CM

## 2022-02-24 DIAGNOSIS — N179 Acute kidney failure, unspecified: Secondary | ICD-10-CM | POA: Diagnosis present

## 2022-02-24 DIAGNOSIS — I071 Rheumatic tricuspid insufficiency: Secondary | ICD-10-CM | POA: Diagnosis present

## 2022-02-24 DIAGNOSIS — I130 Hypertensive heart and chronic kidney disease with heart failure and stage 1 through stage 4 chronic kidney disease, or unspecified chronic kidney disease: Secondary | ICD-10-CM | POA: Diagnosis present

## 2022-02-24 DIAGNOSIS — E119 Type 2 diabetes mellitus without complications: Secondary | ICD-10-CM | POA: Diagnosis not present

## 2022-02-24 DIAGNOSIS — Z90711 Acquired absence of uterus with remaining cervical stump: Secondary | ICD-10-CM

## 2022-02-24 DIAGNOSIS — Z888 Allergy status to other drugs, medicaments and biological substances status: Secondary | ICD-10-CM

## 2022-02-24 DIAGNOSIS — F1320 Sedative, hypnotic or anxiolytic dependence, uncomplicated: Secondary | ICD-10-CM | POA: Diagnosis present

## 2022-02-24 DIAGNOSIS — I6523 Occlusion and stenosis of bilateral carotid arteries: Secondary | ICD-10-CM | POA: Diagnosis present

## 2022-02-24 DIAGNOSIS — R001 Bradycardia, unspecified: Secondary | ICD-10-CM | POA: Diagnosis present

## 2022-02-24 DIAGNOSIS — D649 Anemia, unspecified: Secondary | ICD-10-CM | POA: Diagnosis not present

## 2022-02-24 DIAGNOSIS — Z96652 Presence of left artificial knee joint: Secondary | ICD-10-CM | POA: Diagnosis present

## 2022-02-24 DIAGNOSIS — E875 Hyperkalemia: Secondary | ICD-10-CM | POA: Diagnosis present

## 2022-02-24 DIAGNOSIS — E782 Mixed hyperlipidemia: Secondary | ICD-10-CM | POA: Diagnosis present

## 2022-02-24 DIAGNOSIS — I10 Essential (primary) hypertension: Secondary | ICD-10-CM | POA: Diagnosis not present

## 2022-02-24 DIAGNOSIS — R8589 Other abnormal findings in specimens from digestive organs and abdominal cavity: Secondary | ICD-10-CM | POA: Diagnosis present

## 2022-02-24 DIAGNOSIS — K5520 Angiodysplasia of colon without hemorrhage: Secondary | ICD-10-CM | POA: Diagnosis present

## 2022-02-24 DIAGNOSIS — I4891 Unspecified atrial fibrillation: Secondary | ICD-10-CM | POA: Diagnosis present

## 2022-02-24 DIAGNOSIS — E1122 Type 2 diabetes mellitus with diabetic chronic kidney disease: Secondary | ICD-10-CM | POA: Diagnosis present

## 2022-02-24 DIAGNOSIS — Z87891 Personal history of nicotine dependence: Secondary | ICD-10-CM | POA: Diagnosis not present

## 2022-02-24 DIAGNOSIS — F0394 Unspecified dementia, unspecified severity, with anxiety: Secondary | ICD-10-CM | POA: Diagnosis present

## 2022-02-24 DIAGNOSIS — Z7982 Long term (current) use of aspirin: Secondary | ICD-10-CM

## 2022-02-24 DIAGNOSIS — D62 Acute posthemorrhagic anemia: Secondary | ICD-10-CM | POA: Diagnosis present

## 2022-02-24 DIAGNOSIS — Z902 Acquired absence of lung [part of]: Secondary | ICD-10-CM

## 2022-02-24 DIAGNOSIS — F411 Generalized anxiety disorder: Secondary | ICD-10-CM | POA: Diagnosis present

## 2022-02-24 DIAGNOSIS — K449 Diaphragmatic hernia without obstruction or gangrene: Secondary | ICD-10-CM | POA: Diagnosis not present

## 2022-02-24 DIAGNOSIS — K922 Gastrointestinal hemorrhage, unspecified: Secondary | ICD-10-CM | POA: Insufficient documentation

## 2022-02-24 DIAGNOSIS — D509 Iron deficiency anemia, unspecified: Secondary | ICD-10-CM | POA: Diagnosis present

## 2022-02-24 DIAGNOSIS — F419 Anxiety disorder, unspecified: Secondary | ICD-10-CM | POA: Diagnosis not present

## 2022-02-24 DIAGNOSIS — R0602 Shortness of breath: Secondary | ICD-10-CM | POA: Diagnosis not present

## 2022-02-24 DIAGNOSIS — D124 Benign neoplasm of descending colon: Secondary | ICD-10-CM | POA: Diagnosis not present

## 2022-02-24 DIAGNOSIS — Z79899 Other long term (current) drug therapy: Secondary | ICD-10-CM

## 2022-02-24 DIAGNOSIS — Z833 Family history of diabetes mellitus: Secondary | ICD-10-CM

## 2022-02-24 DIAGNOSIS — Z66 Do not resuscitate: Secondary | ICD-10-CM | POA: Diagnosis present

## 2022-02-24 DIAGNOSIS — I509 Heart failure, unspecified: Secondary | ICD-10-CM | POA: Diagnosis not present

## 2022-02-24 DIAGNOSIS — J90 Pleural effusion, not elsewhere classified: Secondary | ICD-10-CM | POA: Diagnosis not present

## 2022-02-24 DIAGNOSIS — Z86711 Personal history of pulmonary embolism: Secondary | ICD-10-CM

## 2022-02-24 DIAGNOSIS — R059 Cough, unspecified: Secondary | ICD-10-CM | POA: Diagnosis not present

## 2022-02-24 DIAGNOSIS — H409 Unspecified glaucoma: Secondary | ICD-10-CM | POA: Diagnosis present

## 2022-02-24 DIAGNOSIS — C3491 Malignant neoplasm of unspecified part of right bronchus or lung: Secondary | ICD-10-CM | POA: Diagnosis present

## 2022-02-24 DIAGNOSIS — I255 Ischemic cardiomyopathy: Secondary | ICD-10-CM | POA: Diagnosis present

## 2022-02-24 DIAGNOSIS — Z7984 Long term (current) use of oral hypoglycemic drugs: Secondary | ICD-10-CM

## 2022-02-24 DIAGNOSIS — I272 Pulmonary hypertension, unspecified: Secondary | ICD-10-CM | POA: Diagnosis present

## 2022-02-24 DIAGNOSIS — Z951 Presence of aortocoronary bypass graft: Secondary | ICD-10-CM

## 2022-02-24 DIAGNOSIS — K635 Polyp of colon: Secondary | ICD-10-CM | POA: Diagnosis present

## 2022-02-24 DIAGNOSIS — E1169 Type 2 diabetes mellitus with other specified complication: Secondary | ICD-10-CM | POA: Diagnosis not present

## 2022-02-24 DIAGNOSIS — Z9981 Dependence on supplemental oxygen: Secondary | ICD-10-CM | POA: Diagnosis not present

## 2022-02-24 DIAGNOSIS — I110 Hypertensive heart disease with heart failure: Secondary | ICD-10-CM | POA: Diagnosis not present

## 2022-02-24 DIAGNOSIS — I25119 Atherosclerotic heart disease of native coronary artery with unspecified angina pectoris: Secondary | ICD-10-CM | POA: Diagnosis not present

## 2022-02-24 DIAGNOSIS — Z8249 Family history of ischemic heart disease and other diseases of the circulatory system: Secondary | ICD-10-CM

## 2022-02-24 NOTE — ED Triage Notes (Signed)
Pt here after she had a syncopal episode at home around 2300 tonight. Pt states she has been feeling weak for " a couple of months".

## 2022-02-25 ENCOUNTER — Observation Stay (HOSPITAL_COMMUNITY): Payer: PPO

## 2022-02-25 ENCOUNTER — Emergency Department (HOSPITAL_COMMUNITY): Payer: PPO

## 2022-02-25 ENCOUNTER — Observation Stay (HOSPITAL_COMMUNITY)
Admit: 2022-02-25 | Discharge: 2022-02-25 | Disposition: A | Discharge status: Home / Self Care | Payer: PPO | Attending: Internal Medicine | Admitting: Internal Medicine

## 2022-02-25 ENCOUNTER — Observation Stay (HOSPITAL_BASED_OUTPATIENT_CLINIC_OR_DEPARTMENT_OTHER): Payer: PPO

## 2022-02-25 ENCOUNTER — Encounter (HOSPITAL_COMMUNITY): Payer: Self-pay | Admitting: Internal Medicine

## 2022-02-25 DIAGNOSIS — C3491 Malignant neoplasm of unspecified part of right bronchus or lung: Secondary | ICD-10-CM

## 2022-02-25 DIAGNOSIS — N179 Acute kidney failure, unspecified: Secondary | ICD-10-CM | POA: Diagnosis not present

## 2022-02-25 DIAGNOSIS — I5033 Acute on chronic diastolic (congestive) heart failure: Secondary | ICD-10-CM

## 2022-02-25 DIAGNOSIS — D62 Acute posthemorrhagic anemia: Secondary | ICD-10-CM

## 2022-02-25 DIAGNOSIS — R55 Syncope and collapse: Secondary | ICD-10-CM | POA: Diagnosis present

## 2022-02-25 DIAGNOSIS — K922 Gastrointestinal hemorrhage, unspecified: Secondary | ICD-10-CM | POA: Diagnosis not present

## 2022-02-25 DIAGNOSIS — J9611 Chronic respiratory failure with hypoxia: Secondary | ICD-10-CM | POA: Insufficient documentation

## 2022-02-25 DIAGNOSIS — I2510 Atherosclerotic heart disease of native coronary artery without angina pectoris: Secondary | ICD-10-CM | POA: Diagnosis not present

## 2022-02-25 DIAGNOSIS — E875 Hyperkalemia: Secondary | ICD-10-CM

## 2022-02-25 DIAGNOSIS — I4891 Unspecified atrial fibrillation: Secondary | ICD-10-CM | POA: Diagnosis not present

## 2022-02-25 DIAGNOSIS — Z87891 Personal history of nicotine dependence: Secondary | ICD-10-CM | POA: Diagnosis not present

## 2022-02-25 DIAGNOSIS — E782 Mixed hyperlipidemia: Secondary | ICD-10-CM

## 2022-02-25 DIAGNOSIS — K921 Melena: Secondary | ICD-10-CM | POA: Diagnosis not present

## 2022-02-25 DIAGNOSIS — I5032 Chronic diastolic (congestive) heart failure: Secondary | ICD-10-CM

## 2022-02-25 DIAGNOSIS — I10 Essential (primary) hypertension: Secondary | ICD-10-CM | POA: Diagnosis not present

## 2022-02-25 DIAGNOSIS — E1165 Type 2 diabetes mellitus with hyperglycemia: Secondary | ICD-10-CM | POA: Diagnosis not present

## 2022-02-25 DIAGNOSIS — E119 Type 2 diabetes mellitus without complications: Secondary | ICD-10-CM | POA: Diagnosis not present

## 2022-02-25 LAB — ECHOCARDIOGRAM COMPLETE
AR max vel: 2.62 cm2
AV Area VTI: 2.74 cm2
AV Area mean vel: 2.61 cm2
AV Mean grad: 4.5 mmHg
AV Peak grad: 8.8 mmHg
Ao pk vel: 1.49 m/s
Area-P 1/2: 4.12 cm2
Height: 65 in
MV M vel: 4.09 m/s
MV Peak grad: 66.9 mmHg
MV VTI: 1.95 cm2
S' Lateral: 2.8 cm
Weight: 2800 oz

## 2022-02-25 LAB — BASIC METABOLIC PANEL
Anion gap: 10 (ref 5–15)
Anion gap: 8 (ref 5–15)
BUN: 34 mg/dL — ABNORMAL HIGH (ref 8–23)
BUN: 37 mg/dL — ABNORMAL HIGH (ref 8–23)
CO2: 30 mmol/L (ref 22–32)
CO2: 31 mmol/L (ref 22–32)
Calcium: 8.5 mg/dL — ABNORMAL LOW (ref 8.9–10.3)
Calcium: 8.9 mg/dL (ref 8.9–10.3)
Chloride: 100 mmol/L (ref 98–111)
Chloride: 99 mmol/L (ref 98–111)
Creatinine, Ser: 1.92 mg/dL — ABNORMAL HIGH (ref 0.44–1.00)
Creatinine, Ser: 2.09 mg/dL — ABNORMAL HIGH (ref 0.44–1.00)
GFR, Estimated: 24 mL/min — ABNORMAL LOW (ref >60)
GFR, Estimated: 26 mL/min — ABNORMAL LOW (ref >60)
Glucose, Bld: 152 mg/dL — ABNORMAL HIGH (ref 70–99)
Glucose, Bld: 188 mg/dL — ABNORMAL HIGH (ref 70–99)
Potassium: 5.7 mmol/L — ABNORMAL HIGH (ref 3.5–5.1)
Potassium: 5.7 mmol/L — ABNORMAL HIGH (ref 3.5–5.1)
Sodium: 138 mmol/L (ref 135–145)
Sodium: 140 mmol/L (ref 135–145)

## 2022-02-25 LAB — CBC
HCT: 25.4 % — ABNORMAL LOW (ref 36.0–46.0)
Hemoglobin: 7.4 g/dL — ABNORMAL LOW (ref 12.0–15.0)
MCH: 28.6 pg (ref 26.0–34.0)
MCHC: 29.1 g/dL — ABNORMAL LOW (ref 30.0–36.0)
MCV: 98.1 fL (ref 80.0–100.0)
Platelets: 255 10*3/uL (ref 150–400)
RBC: 2.59 MIL/uL — ABNORMAL LOW (ref 3.87–5.11)
RDW: 17.9 % — ABNORMAL HIGH (ref 11.5–15.5)
WBC: 6.5 10*3/uL (ref 4.0–10.5)
nRBC: 0 % (ref 0.0–0.2)

## 2022-02-25 LAB — POTASSIUM
Potassium: 5.8 mmol/L — ABNORMAL HIGH (ref 3.5–5.1)
Potassium: 5.1 mmol/L (ref 3.5–5.1)
Potassium: 5.1 mmol/L (ref 3.5–5.1)
Potassium: 5 mmol/L (ref 3.5–5.1)
Potassium: 4.7 mmol/L (ref 3.5–5.1)

## 2022-02-25 LAB — GLUCOSE, CAPILLARY
Glucose-Capillary: 111 mg/dL — ABNORMAL HIGH (ref 70–99)
Glucose-Capillary: 193 mg/dL — ABNORMAL HIGH (ref 70–99)
Glucose-Capillary: 116 mg/dL — ABNORMAL HIGH (ref 70–99)

## 2022-02-25 LAB — POC OCCULT BLOOD, ED: Fecal Occult Bld: POSITIVE — AB

## 2022-02-25 LAB — T4, FREE: Free T4: 0.86 ng/dL (ref 0.61–1.12)

## 2022-02-25 LAB — ABO/RH: ABO/RH(D): A POS

## 2022-02-25 LAB — CBG MONITORING, ED
Glucose-Capillary: 134 mg/dL — ABNORMAL HIGH (ref 70–99)
Glucose-Capillary: 182 mg/dL — ABNORMAL HIGH (ref 70–99)

## 2022-02-25 LAB — IRON AND TIBC
Iron: 36 ug/dL (ref 28–170)
Saturation Ratios: 13 % (ref 10.4–31.8)
TIBC: 286 ug/dL (ref 250–450)
UIBC: 250 ug/dL

## 2022-02-25 LAB — BRAIN NATRIURETIC PEPTIDE: B Natriuretic Peptide: 459 pg/mL — ABNORMAL HIGH (ref 0.0–100.0)

## 2022-02-25 LAB — TROPONIN I (HIGH SENSITIVITY)
Troponin I (High Sensitivity): 10 ng/L (ref <18)
Troponin I (High Sensitivity): 9 ng/L (ref <18)
Troponin I (High Sensitivity): 10 ng/L (ref <18)

## 2022-02-25 LAB — FERRITIN: Ferritin: 281 ng/mL (ref 11–307)

## 2022-02-25 LAB — VITAMIN B12: Vitamin B-12: 672 pg/mL (ref 180–914)

## 2022-02-25 LAB — FOLATE: Folate: 26.9 ng/mL (ref >5.9)

## 2022-02-25 LAB — TSH: TSH: 1.729 u[IU]/mL (ref 0.350–4.500)

## 2022-02-25 MED ORDER — MOMETASONE FURO-FORMOTEROL FUM 200-5 MCG/ACT IN AERO
2.0000 | INHALATION_SPRAY | Freq: Two times a day (BID) | RESPIRATORY_TRACT | Status: DC
  Administered 2022-02-25 – 2022-03-03 (×13): 2 via RESPIRATORY_TRACT
  Filled 2022-02-25: qty 8.8

## 2022-02-25 MED ORDER — ASPIRIN 81 MG PO TBEC
81.0000 mg | DELAYED_RELEASE_TABLET | Freq: Every day | ORAL | Status: DC
  Administered 2022-02-25 – 2022-03-03 (×7): 81 mg via ORAL
  Filled 2022-02-25 (×7): qty 1

## 2022-02-25 MED ORDER — ALBUTEROL SULFATE HFA 108 (90 BASE) MCG/ACT IN AERS: 2.0000 | INHALATION_SPRAY | Freq: Four times a day (QID) | RESPIRATORY_TRACT | Status: DC | PRN

## 2022-02-25 MED ORDER — ALPRAZOLAM 0.5 MG PO TABS
0.5000 mg | ORAL_TABLET | Freq: Two times a day (BID) | ORAL | Status: DC
  Administered 2022-02-25 – 2022-03-03 (×13): 0.5 mg via ORAL
  Filled 2022-02-25 (×13): qty 1

## 2022-02-25 MED ORDER — PANTOPRAZOLE SODIUM 40 MG IV SOLR
40.0000 mg | Freq: Two times a day (BID) | INTRAVENOUS | Status: DC
  Administered 2022-02-25 – 2022-03-03 (×13): 40 mg via INTRAVENOUS
  Filled 2022-02-25 (×13): qty 10

## 2022-02-25 MED ORDER — ACETAMINOPHEN 325 MG PO TABS
650.0000 mg | ORAL_TABLET | Freq: Four times a day (QID) | ORAL | Status: DC | PRN
  Administered 2022-02-28: 650 mg via ORAL
  Filled 2022-02-25: qty 2

## 2022-02-25 MED ORDER — INSULIN ASPART 100 UNIT/ML IJ SOLN: 0.0000 [IU] | Freq: Every day | INTRAMUSCULAR | Status: DC

## 2022-02-25 MED ORDER — SODIUM ZIRCONIUM CYCLOSILICATE 5 G PO PACK
10.0000 g | PACK | Freq: Once | ORAL | Status: AC
  Administered 2022-02-25: 10 g via ORAL
  Filled 2022-02-25: qty 2

## 2022-02-25 MED ORDER — ATORVASTATIN CALCIUM 40 MG PO TABS
80.0000 mg | ORAL_TABLET | Freq: Every day | ORAL | Status: DC
  Administered 2022-02-25 – 2022-03-02 (×5): 80 mg via ORAL
  Filled 2022-02-25 (×5): qty 2

## 2022-02-25 MED ORDER — INSULIN ASPART 100 UNIT/ML IJ SOLN
0.0000 [IU] | Freq: Three times a day (TID) | INTRAMUSCULAR | Status: DC
  Administered 2022-02-25: 2 [IU] via SUBCUTANEOUS
  Administered 2022-02-25: 1 [IU] via SUBCUTANEOUS
  Administered 2022-03-02 (×2): 2 [IU] via SUBCUTANEOUS
  Filled 2022-02-25: qty 1

## 2022-02-25 MED ORDER — PROCHLORPERAZINE EDISYLATE 10 MG/2ML IJ SOLN: 10.0000 mg | Freq: Four times a day (QID) | INTRAMUSCULAR | Status: DC | PRN

## 2022-02-25 MED ORDER — ALBUTEROL SULFATE (2.5 MG/3ML) 0.083% IN NEBU
2.5000 mg | INHALATION_SOLUTION | Freq: Four times a day (QID) | RESPIRATORY_TRACT | Status: DC | PRN
  Administered 2022-02-27 – 2022-02-28 (×3): 2.5 mg via RESPIRATORY_TRACT
  Filled 2022-02-25 (×3): qty 3

## 2022-02-25 MED ORDER — ACETAMINOPHEN 650 MG RE SUPP: 650.0000 mg | Freq: Four times a day (QID) | RECTAL | Status: DC | PRN

## 2022-02-25 NOTE — TOC Initial Note (Signed)
Transition of Care [facility]) - Initial/Assessment Note    Patient Details  Name: Karen Tate MRN: [id number] Date of Birth: 13-Sep-1945  Transition of Care [facility]) CM/SW Contact:    Ihor Gully, LCSW Phone Number: 02/25/2022, 2:23 PM  Clinical Narrative:                 Patient from home. In obs due to syncope and collapse. PT recommends HHPT. Daughter states that patient perfers OP PT in [place]. Patient referral faxed to [facility] OP PT at [phone].  Expected Discharge Plan: OP Rehab Barriers to Discharge: Continued Medical Work up   Patient Goals and CMS Choice Patient states their goals for this hospitalization and ongoing recovery are:: OP rehab      Expected Discharge Plan and Services Expected Discharge Plan: OP Rehab       Living arrangements for the past 2 months: Single Family Home                                      Prior Living Arrangements/Services Living arrangements for the past 2 months: Single Family Home Lives with:: Spouse Patient language and need for interpreter reviewed:: Yes        Need for Family Participation in Patient Care: Yes (Comment) Care giver support system in place?: Yes (comment) Current home services: DME Criminal Activity/Legal Involvement Pertinent to Current Situation/Hospitalization: No - Comment as needed  Activities of Daily Living Home Assistive Devices/Equipment: Cane (specify quad or straight), Eyeglasses, Shower chair with back, Walker (specify type) ADL Screening (condition at time of admission) Patient's cognitive ability adequate to safely complete daily activities?: Yes Is the patient deaf or have difficulty hearing?: No Does the patient have difficulty seeing, even when wearing glasses/contacts?: No Does the patient have difficulty concentrating, remembering, or making decisions?: Yes Patient able to express need for assistance with ADLs?: Yes Does the patient have difficulty dressing or bathing?:  Yes Independently performs ADLs?: No Communication: Independent Dressing (OT): Needs assistance Is this a change from baseline?: Pre-admission baseline Grooming: Independent Feeding: Independent Bathing: Needs assistance Is this a change from baseline?: Pre-admission baseline Toileting: Needs assistance Is this a change from baseline?: Pre-admission baseline In/Out Bed: Needs assistance Is this a change from baseline?: Pre-admission baseline Walks in Home: Needs assistance Is this a change from baseline?: Pre-admission baseline Does the patient have difficulty walking or climbing stairs?: Yes Weakness of Legs: Both Weakness of Arms/Hands: Both  Permission Sought/Granted Permission sought to share information with : Family Supports    Share Information with NAME: daughter, [name]           Emotional Assessment         Alcohol / Substance Use: Not Applicable Psych Involvement: No (comment)  Admission diagnosis:  Hyperkalemia [E87.5] Syncope [R55] Normocytic anemia [D64.9] Acute kidney injury (nontraumatic) ([place]) [N17.9] Rectal bleed [K62.5] Atrial fibrillation with slow ventricular response ([place]) [I48.91] Syncope, unspecified syncope type [R55] Patient Active Problem List   Diagnosis Date Noted   Syncope and collapse 02/25/2022   GI bleed 02/25/2022   Hyperkalemia 02/25/2022   Chronic respiratory failure with hypoxia ([place]) 02/25/2022   Chronic diastolic CHF (congestive heart failure) ([place]) 02/25/2022   Essential hypertension 02/25/2022   Mixed hyperlipidemia 02/25/2022   Acute kidney injury (nontraumatic) ([place]) 02/25/2022   ABLA (acute blood loss anemia) 02/25/2022   Chronic congestive heart failure ([place]) 08/03/2020   CHF exacerbation ([place])  07/21/2020   Acute on chronic respiratory failure with hypoxia ([place]) 07/21/2020   Mild protein-calorie malnutrition ([place]) 07/21/2020   Non-small cell cancer of right lung ([place]) 06/29/2019   Normocytic anemia 06/29/2019    Postoperative pain 06/23/2019   S/P lobectomy of lung 05/13/2019   Coronary artery disease involving native heart without angina pectoris    Postop check    S/P CABG x 3 09/18/2017   Pulmonary embolus ([place]) 09/15/2017   DMII (diabetes mellitus, type 2) ([place]) 09/15/2017   Non-STEMI (non-ST elevated myocardial infarction) ([place])    Unstable angina ([place]) 09/12/2017   AP (angina pectoris) ([place])    Abnormal stress test    Chest pain    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

## 2022-02-25 NOTE — ED Notes (Signed)
Rolled pt to right side.

## 2022-02-25 NOTE — H&P (Signed)
History and Physical    Patient: Karen Tate ZOX:[id number] DOB: [birth year] DOA: 02/24/2022 DOS: the patient was seen and examined on 02/25/2022 PCP: Neale Burly, MD  Patient coming from: Home  Chief Complaint:  Chief Complaint  Patient presents with   Weakness   Loss of Consciousness   HPI: Karen Tate is a 76 y.o. female with medical history significant of CAD s/p CABG, CHF, chronic respiratory failure on supplemental oxygen at 2 LPM via [place], non-small cell carcinoma of right lung (follows with Dr. Delton Coombes) hypertension, hyperlipidemia, T2DM who presents to the emergency department after sustaining a syncopal episode at home.  She complained of 27-month history of progressive weakness and increased shortness of breath, she endorsed more than 1 month history of black stool.  Patient states that she got up to go to the restroom when she passed out, husband at bedside states that it was only for a few minutes and there was no postictal state on recovery.  She denies chest pain, pressure, nausea, vomiting, abdominal pain.  ED Course:  In the emergency department, patient was tachypneic and bradycardic, BP was 113/54 and O2 sat was 100% on 2 LPM of oxygen.  Workup in the ED showed normocytic anemia, BMP showed sodium 140, potassium 5.7, chloride 99, bicarb 31, blood glucose 188, FOBT was positive, BNP 459 (this was 328 on 10/21/2021) Chest x-ray showed evidence of prior median sternotomy/CABG.  Chronic appearing increased lung markings without acute cardiopulmonary disease. Hospitalist was asked to admit patient for further evaluation and management.  Review of Systems: Review of systems as noted in the HPI. All other systems reviewed and are negative.   Past Medical History:  Diagnosis Date   Anxiety neurosis    Arthritis    Asthma    CAD (coronary artery disease)    a. 08/2017: abnormal nuc-> sent directly to [facility], NSTEMI, s/p CABG (left internal mammary artery to left anterior  descending, sequential saphenous vein graft to ramus intermedius branches 1 and 2).   Carotid artery disease (HCC)    Chronic anemia    Dizziness    GERD (gastroesophageal reflux disease)    Glaucoma    Heart disease    Hiatal hernia    Hyperlipemia    Hypertension    Ischemic cardiomyopathy    Joint pain    Mitral regurgitation    Postoperative atrial fibrillation ([place]) 08/2017   Pulmonary embolus ([place])    Type 2 diabetes mellitus ([place])    Past Surgical History:  Procedure Laterality Date   BACK SURGERY  2016   CORONARY ARTERY BYPASS GRAFT N/A 09/18/2017   Procedure: CORONARY ARTERY BYPASS GRAFTING (CABG);  Surgeon: [name], MD;  Location: [place];  Service: Open Heart Surgery;  Laterality: N/A;  Saphenous vein harvest. LIMA to LAD Saphenous vein (sequential) ramus 1 & 2   EYE SURGERY Bilateral    "surgery for glaucoma"   HERNIA REPAIR     INTERCOSTAL NERVE BLOCK Right 05/13/2019   Procedure: Intercostal Nerve Block;  Surgeon: [name], MD;  Location: [facility] OR;  Service: Thoracic;  Laterality: Right;   LEFT HEART CATH AND CORONARY ANGIOGRAPHY N/A 09/15/2017   Procedure: LEFT HEART CATH AND CORONARY ANGIOGRAPHY;  Surgeon: Lorretta Harp, MD;  Location: [facility] [facility];  Service: Cardiovascular;  Laterality: N/A;   LYMPH NODE DISSECTION Right 05/13/2019   Procedure: Lymph Node Dissection;  Surgeon: [name], MD;  Location: [facility];  Service: Thoracic;  Laterality:  Right;   ROTATOR CUFF REPAIR Left    x2   SHOULDER SURGERY     LEFT   TEE WITHOUT CARDIOVERSION N/A 09/18/2017   Procedure: TRANSESOPHAGEAL ECHOCARDIOGRAM (TEE);  Surgeon: [name], MD;  Location: [facility];  Service: Open Heart Surgery;  Laterality: N/A;   TOTAL KNEE ARTHROPLASTY     LEFT   VAGINAL HYSTERECTOMY     partial    Social History:  reports that she quit smoking about 43 years ago. Her smoking use included cigarettes. She has a 17.50 pack-year smoking history.  She has never been exposed to tobacco smoke. She has never used smokeless tobacco. She reports that she does not drink alcohol and does not use drugs.   Allergies  Allergen Reactions   Beta Adrenergic Blockers Other (See Comments)    Dropped heart rate to the 40's   Calcium Channel Blockers Other (See Comments)    Dropped HR into the 40's   Naproxen Hives    Family History  Problem Relation Age of Onset   Diabetes Father    Heart disease Father    Breast cancer Mother    Congestive Heart Failure Brother    Anemia Daughter    Seizures Daughter    Migraines Daughter    Diabetes Daughter    Hypertension Daughter    Bladder Cancer Son    Congestive Heart Failure Son    Congestive Heart Failure Son      Prior to Admission medications   Medication Sig Start Date End Date Taking? Authorizing Provider  acetaminophen (TYLENOL) 500 MG tablet Take 1,000 mg by mouth every 6 (six) hours as needed for moderate pain.    [provider]  albuterol (VENTOLIN HFA) 108 (90 Base) MCG/ACT inhaler Inhale 2 puffs into the lungs every 6 (six) hours as needed. 03/01/21   Chesley Mires, MD  ALPRAZolam Duanne Moron) 0.5 MG tablet Take 0.5 mg by mouth 2 (two) times daily.    [provider]  amLODipine (NORVASC) 10 MG tablet Take 10 mg by mouth daily.    [provider]  aspirin 81 MG tablet Take 1 tablet (81 mg total) by mouth daily. 10/20/17   Nani Skillern, PA-C  atorvastatin (LIPITOR) 80 MG tablet Take 1 tablet (80 mg total) by mouth daily at 6 PM. 11/17/17   Herminio Commons, MD  carvedilol (COREG) 25 MG tablet Take 1 tablet by mouth 2 (two) times daily. 01/14/20   [provider]  diclofenac Sodium (VOLTAREN) 1 % GEL Apply 2 g topically 4 (four) times daily. Patient not taking: Reported on 01/11/2022    [provider]  fish oil-omega-3 fatty acids 1000 MG capsule Take 1 g by mouth 3 (three) times daily.     [provider]   fluticasone-salmeterol (ADVAIR) 250-50 MCG/ACT AEPB Inhale 1 puff into the lungs in the morning and at bedtime. Advair 250/50 one puff 2 times/ day 03/30/21   Chesley Mires, MD  folic acid (FOLVITE) 1 MG tablet TAKE 1 TABLET EVERY DAY 02/20/21   Tarri Abernethy M, PA-C  furosemide (LASIX) 20 MG tablet Take 2 tablets (40 mg total) by mouth in the morning. & 20 mg in the late afternoon 08/30/21   Arnoldo Lenis, MD  latanoprost (XALATAN) 0.005 % ophthalmic solution Place 1 drop into both eyes at bedtime. 02/10/19   [provider]  lisinopril (ZESTRIL) 40 MG tablet TAKE 1 TABLET EVERY DAY ( DOSE INCREASE ) 08/23/20   Carlyle Dolly  F, MD  magnesium oxide (MAG-OX) 400 MG tablet Take 1 tablet (400 mg total) by mouth 2 (two) times daily. 10/10/17   Dunn, Nedra Hai, PA-C  meclizine (ANTIVERT) 25 MG tablet Take 25 mg by mouth 3 (three) times daily as needed for dizziness.     [provider]  metFORMIN (GLUCOPHAGE) 1000 MG tablet Take 1,000 mg by mouth 2 (two) times daily. As needed 10/26/21   [provider]  Multiple Vitamins-Minerals (CENTRUM SILVER 50+WOMEN) TABS Take 1 tablet by mouth daily.     [provider]  omeprazole (PRILOSEC) 40 MG capsule Take 40 mg by mouth daily.    [provider]  potassium chloride SA (KLOR-CON M) 20 MEQ tablet Take 2 tablets (40 mEq total) by mouth daily. 08/30/2021 dose increase 08/30/21   Arnoldo Lenis, MD  tiZANidine (ZANAFLEX) 4 MG tablet Take 4 mg by mouth at bedtime as needed. 11/14/20   [provider]  traMADol (ULTRAM) 50 MG tablet Take 50 mg by mouth 3 (three) times daily as needed. 12/29/20   [provider]  vitamin B-12 (CYANOCOBALAMIN) 500 MCG tablet Take 500 mcg by mouth daily.    [provider]    Physical Exam: BP 115/60   Pulse (!) 53   Temp 97.7 F (36.5 C) (Oral)   Resp (!) 26   Ht 5\' 5"  (1.651 m)   Wt 79.4 kg   SpO2 100%   BMI 29.12 kg/m   General: 76 y.o. year-old  female well developed well nourished in no acute distress.  Alert and oriented x3. HEENT: NCAT, EOMI Neck: Supple, trachea medial Cardiovascular: Regular rate and rhythm with no rubs or gallops.  No thyromegaly or JVD noted.  +2 lower extremity edema. 2/4 pulses in all 4 extremities. Respiratory: Clear to auscultation with no wheezes or rales. Good inspiratory effort. Abdomen: Soft, nontender nondistended with normal bowel sounds x4 quadrants. Muskuloskeletal: No cyanosis, clubbing noted bilaterally Neuro: CN II-XII intact, strength 5/5 x 4, sensation, reflexes intact Skin: No ulcerative lesions noted or rashes Psychiatry: Judgement and insight appear normal. Mood is appropriate for condition and setting          Labs on Admission:  Basic Metabolic Panel: Recent Labs  Lab 02/25/22 0004 02/25/22 0238  NA 140  --   K 5.7* 5.8*  CL 99  --   CO2 31  --   GLUCOSE 188*  --   BUN 34*  --   CREATININE 2.09*  --   CALCIUM 8.9  --    Liver Function Tests: No results for input(s): "AST", "ALT", "ALKPHOS", "BILITOT", "PROT", "ALBUMIN" in the last 168 hours. No results for input(s): "LIPASE", "AMYLASE" in the last 168 hours. No results for input(s): "AMMONIA" in the last 168 hours. CBC: Recent Labs  Lab 02/25/22 0004  WBC 6.5  HGB 7.4*  HCT 25.4*  MCV 98.1  PLT 255   Cardiac Enzymes: No results for input(s): "CKTOTAL", "CKMB", "CKMBINDEX", "TROPONINI" in the last 168 hours.  BNP (last 3 results) Recent Labs    09/07/21 1000 10/21/21 1302 02/25/22 0238  BNP 211.0* 328.0* 459.0*    ProBNP (last 3 results) No results for input(s): "PROBNP" in the last 8760 hours.  CBG: Recent Labs  Lab 02/25/22 0008  GLUCAP 182*    Radiological Exams on Admission: DG Chest Port 1 View  Result Date: 02/25/2022 CLINICAL DATA:  Syncope. EXAM: PORTABLE CHEST 1 VIEW COMPARISON:  October 21, 2021 FINDINGS: Multiple sternal wires and  vascular clips are noted. The cardiac silhouette is mildly  enlarged and unchanged in size. There is stable mild to moderate severity elevation of the right hemidiaphragm. Chronic appearing increased lung markings are seen without evidence of an acute infiltrate, pleural effusion or pneumothorax. Multilevel degenerative changes are seen throughout the thoracic spine. IMPRESSION: 1. Evidence of prior median sternotomy/CABG. 2. Chronic appearing increased lung markings without acute cardiopulmonary disease. Electronically Signed   By: Virgina Norfolk M.D.   On: 02/25/2022 02:40    EKG: I independently viewed the EKG done and my findings are as followed: Atrial fibrillation with slow ventricular response  Assessment/Plan Present on Admission:  Syncope  Non-small cell cancer of right lung ([place])  Coronary artery disease involving native heart without angina pectoris  Principal Problem:   Syncope Active Problems:   DMII (diabetes mellitus, type 2) (HCC)   Coronary artery disease involving native heart without angina pectoris   Non-small cell cancer of right lung (HCC)   GI bleed   Hyperkalemia   Chronic respiratory failure with hypoxia (HCC)   Chronic diastolic CHF (congestive heart failure) ([place])   Essential hypertension   Mixed hyperlipidemia  Syncope Continue telemetry and watch for arrhythmias Troponins x 1 was 10 ; continue to trend troponin EKG showed atrial fibrillation with slow ventricular response Echocardiogram done on 07/21/2020 showed LVEF of 60 to 65%.  LV has normal function.  +RWMA, G2 DD Echocardiogram will be done to rule out significant aortic stenosis or other outflow obstruction, and also to evaluate EF and to rule out segmental/Regional wall motion abnormalities.  Carotid artery Dopplers will be done to rule out hemodynamically significant stenosis Cardiology will be consulted  GI bleed H/H= 7.4/25.4, this was 10.7/35.6 on 11/07/2021 Hemoccult was positive Continue IV Protonix  Patient will be placed n.p.o. in anticipation  for possible endoscopy Gastroenterologist will be consulted  Hyperkalemia K+ 5.7, Lokelma given Lisinopril be held at this time  Type 2 diabetes mellitus Continue ISS and hypoglycemic protocol Metformin will be held at this time  Mixed hyperlipidemia Continue Lipitor  Essential hypertension Continue amlodipine Coreg will be held at this time due to bradycardia Lisinopril will be held at this time due to hyperkalemia   CAD s/p CABG Continue aspirin, Lipitor, Coreg held at this time due to bradycardia  Chronic respiratory failure with hypoxia Continue supplemental oxygen  Chronic diastolic CHF  Continue total input/output, daily weights and fluid restriction Echocardiogram in the morning   Non-small cell carcinoma of right lung Stable, patient follows with Dr. Delton Coombes    DVT prophylaxis: SCDs  Code Status: Full code  Family Communication: Husband at bedside (all questions answered to satisfaction)  Consults: Cardiology, gastroenterology  Severity of Illness: The appropriate patient status for this patient is OBSERVATION. Observation status is judged to be reasonable and necessary in order to provide the required intensity of service to ensure the patient's safety. The patient's presenting symptoms, physical exam findings, and initial radiographic and laboratory data in the context of their medical condition is felt to place them at decreased risk for further clinical deterioration. Furthermore, it is anticipated that the patient will be medically stable for discharge from the hospital within 2 midnights of admission.   Author: Bernadette Hoit, DO 02/25/2022 6:42 AM  For on call review [email or web address].

## 2022-02-25 NOTE — Procedures (Signed)
[facility]  Routine EEG Report  Video Performed: Performed  Demographics: Patient Name:   Aishia, Barkey  Date of Birth:   28-Aug-1945  Identification Number:   MRN - [id number]  Study Times:  Study Start Time:   02/25/2022 13:33:00 Study End Time:   02/25/2022 14:16:00  Indication(s): Syncope  Medication:  Xanax  Technical Summary:  This EEG was performed utilizing standard International 10-20 System of electrode placement. One channel electrocardiogram was monitored. Data were obtained, stored, and interpreted utilizing referential montage recording, with reformatting to longitudinal, transverse bipolar, and referential montages as necessary for interpretation.  State(s):       Awake      Drowsy   Activation Procedures: Hyperventilation: Not performed Photic Stimulation: Not performed   EEG Description:  During wakefulness, the background showed a continuous 8 Hz posterior dominant alpha rhythm which is fairly modulated, symmetrical and reactive to eye opening.  Drowsiness demonstrated a background attenuation. Sleep stage II was not reached.  There was no clinical event noted.  Throughout the record, there was no epileptiform abnormality or lateralizing sign observed.  Intermittent myogenic and movement artifacts were noted.      Impression:  This is a normal awake and drowsy EEG. No epileptiform abnormality or lateralizing sign is observed.  Note that an absence of epileptiform abnormality does not exclude nor support the diagnosis of epilepsy.    Dr Spero Curb    [facility] For Inpatient follow-up with [facility] physician please call [facility] [phone]. This is not an outpatient service. Post hospital discharge, please contact hospital directly.

## 2022-02-25 NOTE — ED Notes (Signed)
X-ray at bedside

## 2022-02-25 NOTE — ED Notes (Signed)
Provider at bedside

## 2022-02-25 NOTE — Progress Notes (Signed)
*  PRELIMINARY RESULTS* Echocardiogram 2D Echocardiogram has been performed.  Karen Tate 02/25/2022, 3:40 PM

## 2022-02-25 NOTE — Progress Notes (Signed)
EEG complete - results pending 

## 2022-02-25 NOTE — Evaluation (Signed)
Physical Therapy Evaluation Patient Details Name: Karen Tate MRN: [id number] DOB: 1945/05/27 Today's Date: 02/25/2022  History of Present Illness  Karen Tate is a 76 y.o. female with medical history significant of CAD s/p CABG, CHF, chronic respiratory failure on supplemental oxygen at 2 LPM via [place], non-small cell carcinoma of right lung (follows with Dr. Delton Coombes) hypertension, hyperlipidemia, T2DM who presents to the emergency department after sustaining a syncopal episode at home. She complained of 37-month history of progressive weakness and increased shortness of breath, she endorsed more than 1 month history of black stool. Patient states that she got up to go to the restroom when she passed out, husband at bedside states that it was only for a few minutes and there was no postictal state on recovery.   Clinical Impression  Patient demonstrates labored movement for sitting up at bedside requiring min/hand held assist to elevate trunk. Patient grabbing onto therapist to steady self upon sitting, verbally cued to use hands on bed for support with good carryover. Patient able to stand with min assist and take a few side steps at bedside with min guard/RW, limited mostly due to generalized weakness, fatigue and c/o SOB. Patient put back to bed with mod assist after therapy with family in room. Patient will benefit from continued skilled physical therapy in hospital and recommended venue below to increase strength, balance, endurance for safe ADLs and gait.      Recommendations for follow up therapy are one component of a multi-disciplinary discharge planning process, led by the attending physician.  Recommendations may be updated based on patient status, additional functional criteria and insurance authorization.  Follow Up Recommendations Home health PT      Assistance Recommended at Discharge Set up Supervision/Assistance  Patient can return home with the following  A little help with  walking and/or transfers;Help with stairs or ramp for entrance;A little help with bathing/dressing/bathroom;Assistance with cooking/housework    Equipment Recommendations None recommended by PT  Recommendations for Other Services       Functional Status Assessment Patient has had a recent decline in their functional status and demonstrates the ability to make significant improvements in function in a reasonable and predictable amount of time.     Precautions / Restrictions Precautions Precautions: Fall Restrictions Weight Bearing Restrictions: No      Mobility  Bed Mobility Overal bed mobility: Needs Assistance Bed Mobility: Supine to Sit, Sit to Supine     Supine to sit: Min assist, HOB elevated Sit to supine: Mod assist   General bed mobility comments: min assist/hand held assist for elevating trunk, patient grabbing therapist to steady self once sitting up    Transfers Overall transfer level: Needs assistance Equipment used: Rolling walker (2 wheels) Transfers: Sit to/from Stand Sit to Stand: Min assist           General transfer comment: patient able to stand with RW/min assist, slighlty unsteady on feet    Ambulation/Gait Ambulation/Gait assistance: Min guard Gait Distance (Feet): 5 Feet Assistive device: Rolling walker (2 wheels) Gait Pattern/deviations: Decreased step length - right, Decreased step length - left, Decreased stride length, Narrow base of support Gait velocity: slow     General Gait Details: patient able to take a few side steps at bedside, limited mostly due to generalized weakness, fatigue and c/o SOB  Stairs            Wheelchair Mobility    Modified Rankin (Stroke Patients Only)  Balance Overall balance assessment: Needs assistance Sitting-balance support: No upper extremity supported, Feet unsupported (ED bed unable to lower for foot support) Sitting balance-Leahy Scale: Fair Sitting balance - Comments: pt leaning to  right slightly, seated EOB Postural control: Right lateral lean Standing balance support: Bilateral upper extremity supported, During functional activity, Reliant on assistive device for balance Standing balance-Leahy Scale: Fair Standing balance comment: with RW                             Pertinent Vitals/Pain Pain Assessment Pain Assessment: Faces Faces Pain Scale: Hurts a little bit Pain Location: left hip Pain Descriptors / Indicators: Aching, Sore, Grimacing, Guarding Pain Intervention(s): Limited activity within patient's tolerance, Monitored during session, Repositioned    Home Living Family/patient expects to be discharged to:: Private residence Living Arrangements: Spouse/significant other Available Help at Discharge: Family;Available 24 hours/day Type of Home: House Home Access: Stairs to enter Entrance Stairs-Rails: None Entrance Stairs-Number of Steps: 1   Home Layout: One level Home Equipment: Conservation officer, nature (2 wheels);Rollator (4 wheels);Cane - single point;Shower seat;Hand held shower head;Adaptive equipment      Prior Function Prior Level of Function : Needs assist       Physical Assist : Mobility (physical);ADLs (physical) Mobility (physical): Bed mobility;Stairs ADLs (physical): Bathing;IADLs Mobility Comments: Sales executive w/ SPC ADLs Comments: Mostly independent, gets assistance to step in/out of tub from husband     Hand Dominance   Dominant Hand: Right    Extremity/Trunk Assessment   Upper Extremity Assessment Upper Extremity Assessment: Generalized weakness    Lower Extremity Assessment Lower Extremity Assessment: Generalized weakness    Cervical / Trunk Assessment Cervical / Trunk Assessment: Normal  Communication   Communication: No difficulties  Cognition Arousal/Alertness: Awake/alert Behavior During Therapy: WFL for tasks assessed/performed Overall Cognitive Status: Within Functional Limits for tasks assessed                                           General Comments      Exercises     Assessment/Plan    PT Assessment Patient needs continued PT services  PT Problem List Decreased strength;Decreased activity tolerance;Decreased balance;Decreased mobility       PT Treatment Interventions DME instruction;Balance training;Gait training;Stair training;Functional mobility training;Patient/family education;Therapeutic activities;Therapeutic exercise    PT Goals (Current goals can be found in the Care Plan section)  Acute Rehab PT Goals Patient Stated Goal: return home with family to asssit PT Goal Formulation: With patient/family Time For Goal Achievement: 03/11/22 Potential to Achieve Goals: Good    Frequency Min 3X/week     Co-evaluation               AM-PAC PT "6 Clicks" Mobility  Outcome Measure Help needed turning from your back to your side while in a flat bed without using bedrails?: A Little Help needed moving from lying on your back to sitting on the side of a flat bed without using bedrails?: A Little Help needed moving to and from a bed to a chair (including a wheelchair)?: A Little Help needed standing up from a chair using your arms (e.g., wheelchair or bedside chair)?: A Little Help needed to walk in hospital room?: A Little Help needed climbing 3-5 steps with a railing? : A Lot 6 Click Score: 17    End of Session Equipment  Utilized During Treatment: Gait belt;Oxygen Activity Tolerance: Patient tolerated treatment well;Patient limited by fatigue Patient left: in bed;with call bell/phone within reach;with family/visitor present Nurse Communication: Mobility status PT Visit Diagnosis: Unsteadiness on feet (R26.81);Other abnormalities of gait and mobility (R26.89);Muscle weakness (generalized) (M62.81)    Time: 1829-9371 PT Time Calculation (min) (ACUTE ONLY): 26 min   Charges:   PT Evaluation $PT Eval Moderate Complexity: 1 Mod PT  Treatments $Therapeutic Activity: 23-37 mins        Zigmund Gottron, SPT

## 2022-02-25 NOTE — ED Notes (Signed)
No urine at this time

## 2022-02-25 NOTE — Progress Notes (Signed)
   02/25/22 1442  ReDS Vest / Clip  Station Marker A  Ruler Value 29.5  ReDS Value Range < 36  ReDS Actual Value 38

## 2022-02-25 NOTE — ED Notes (Signed)
Lab at bedside

## 2022-02-25 NOTE — Plan of Care (Signed)
  Problem: Acute Rehab PT Goals(only PT should resolve) Goal: Pt Will Go Supine/Side To Sit Outcome: Progressing Flowsheets (Taken 02/25/2022 1212) Pt will go Supine/Side to Sit: with min guard assist Goal: Patient Will Transfer Sit To/From Stand Outcome: Progressing Flowsheets (Taken 02/25/2022 1212) Patient will transfer sit to/from stand: with min guard assist Goal: Pt Will Transfer Bed To Chair/Chair To Bed Outcome: Progressing Flowsheets (Taken 02/25/2022 1212) Pt will Transfer Bed to Chair/Chair to Bed: with supervision Goal: Pt Will Ambulate Outcome: Progressing Flowsheets (Taken 02/25/2022 1212) Pt will Ambulate:  15 feet  with min guard assist  with supervision  with least restrictive assistive device   Zigmund Gottron, SPT

## 2022-02-25 NOTE — H&P (View-Only) (Signed)
Gastroenterology Consult   Referring Provider: Dr. Bernadette Hoit  Primary Care Physician:  Neale Burly, MD Primary Gastroenterologist:  previously unassigned, Dr. Abbey Chatters   Patient ID: Karen Tate; [id number]; [birth year]   Admit date: 02/24/2022  LOS: 0 days   Date of Consultation: 02/25/2022  Reason for Consultation:  GI bleed   History of Present Illness   Karen Tate is a 76 y.o. year old female with a history of CAD s/p CABG in 2019, heart failure, DM, hyperlipidemia, lung cancer in 2021 s/p right upper lobectomy, history of PE, COPD, pulmonary hypertension, presenting to the ED with syncopal episode. Presented to the ED with reports of SOB and DOE for several months, worsening over past few weeks. Admitted with acute blood loss anemia, acute on chronic heart failure, hyperkalemia, acute on chronic renal failure.   In ED: Hgb 7.4, previously 10.7 several months ago. Heme positive but stool is brown. Ferritin 281, iron 36. Hyperkalemia with potassium 5.8 and repeat pending. BNP 459. Afib with slow VR.   Cardiology has seen patient this morning. Suspect syncope was vasovagal response. She notes fatigue worsening past several weeks. States stool dark/black for past few weeks. No abdominal pain. Takes 81 mg aspirin daily. Otherwise, no NSAIDs. Prilosec daily. Denies abdominal pain, N/V. Denies dysphagia. Notes poor appetite recently. Feels short of breath worse from baseline currently. Wears 2 liters nasal cannula continuously at home.   Denies FH colon cancer or polyps. Appears she had colonoscopy/EGD in 2020 by Dr. Ladona Horns  in [place] due to [place]. I don't have access to those reports but do see under labs H.pylori negative serology.    Past Medical History:  Diagnosis Date   Anxiety neurosis    Arthritis    Asthma    CAD (coronary artery disease)    a. 08/2017: abnormal nuc-> sent directly to [facility], NSTEMI, s/p CABG (left internal mammary artery to left anterior descending,  sequential saphenous vein graft to ramus intermedius branches 1 and 2).   Carotid artery disease (HCC)    Chronic anemia    Dizziness    GERD (gastroesophageal reflux disease)    Glaucoma    Heart disease    Hiatal hernia    Hyperlipemia    Hypertension    Ischemic cardiomyopathy    Joint pain    Mitral regurgitation    Postoperative atrial fibrillation ([place]) 08/2017   Pulmonary embolus ([place])    Type 2 diabetes mellitus ([place])     Past Surgical History:  Procedure Laterality Date   BACK SURGERY  2016   CORONARY ARTERY BYPASS GRAFT N/A 09/18/2017   Procedure: CORONARY ARTERY BYPASS GRAFTING (CABG);  Surgeon: [name], MD;  Location: [facility];  Service: Open Heart Surgery;  Laterality: N/A;  Saphenous vein harvest. LIMA to LAD Saphenous vein (sequential) ramus 1 & 2   EYE SURGERY Bilateral    "surgery for glaucoma"   HERNIA REPAIR     INTERCOSTAL NERVE BLOCK Right 05/13/2019   Procedure: Intercostal Nerve Block;  Surgeon: [name], MD;  Location: [facility] OR;  Service: Thoracic;  Laterality: Right;   LEFT HEART CATH AND CORONARY ANGIOGRAPHY N/A 09/15/2017   Procedure: LEFT HEART CATH AND CORONARY ANGIOGRAPHY;  Surgeon: Lorretta Harp, MD;  Location: [facility];  Service: Cardiovascular;  Laterality: N/A;   LYMPH NODE DISSECTION Right 05/13/2019   Procedure: Lymph Node Dissection;  Surgeon: [name], MD;  Location: [facility];  Service: Thoracic;  Laterality:  Right;   ROTATOR CUFF REPAIR Left    x2   SHOULDER SURGERY     LEFT   TEE WITHOUT CARDIOVERSION N/A 09/18/2017   Procedure: TRANSESOPHAGEAL ECHOCARDIOGRAM (TEE);  Surgeon: [name], MD;  Location: [facility];  Service: Open Heart Surgery;  Laterality: N/A;   TOTAL KNEE ARTHROPLASTY     LEFT   VAGINAL HYSTERECTOMY     partial    Prior to Admission medications   Medication Sig Start Date End Date Taking? Authorizing Provider  acetaminophen (TYLENOL) 500 MG tablet Take 1,000 mg by  mouth every 6 (six) hours as needed for moderate pain.   Yes [provider]  albuterol (VENTOLIN HFA) 108 (90 Base) MCG/ACT inhaler Inhale 2 puffs into the lungs every 6 (six) hours as needed. Patient taking differently: Inhale 2 puffs into the lungs every 6 (six) hours as needed for wheezing or shortness of breath. 03/01/21  Yes Chesley Mires, MD  ALPRAZolam Duanne Moron) 0.5 MG tablet Take 0.5 mg by mouth 2 (two) times daily.   Yes [provider]  amLODipine (NORVASC) 10 MG tablet Take 10 mg by mouth daily.   Yes [provider]  aspirin 81 MG tablet Take 1 tablet (81 mg total) by mouth daily. 10/20/17  Yes Lars Pinks M, PA-C  atorvastatin (LIPITOR) 80 MG tablet Take 1 tablet (80 mg total) by mouth daily at 6 PM. 11/17/17  Yes Herminio Commons, MD  carvedilol (COREG) 25 MG tablet Take 1 tablet by mouth 2 (two) times daily. 01/14/20  Yes [provider]  fish oil-omega-3 fatty acids 1000 MG capsule Take 1 g by mouth 3 (three) times daily.    Yes [provider]  fluticasone-salmeterol (ADVAIR) 250-50 MCG/ACT AEPB Inhale 1 puff into the lungs in the morning and at bedtime. Advair 250/50 one puff 2 times/ day 03/30/21  Yes Chesley Mires, MD  folic acid (FOLVITE) 1 MG tablet TAKE 1 TABLET EVERY DAY Patient taking differently: Take 1 mg by mouth daily. 02/20/21  Yes Pennington, Rebekah M, PA-C  furosemide (LASIX) 20 MG tablet Take 2 tablets (40 mg total) by mouth in the morning. & 20 mg in the late afternoon Patient taking differently: Take 40 mg by mouth in the morning. 08/30/21  Yes Branch, Alphonse Guild, MD  lisinopril (ZESTRIL) 40 MG tablet TAKE 1 TABLET EVERY DAY ( DOSE INCREASE ) Patient taking differently: Take 40 mg by mouth daily. 08/23/20  Yes Branch, Alphonse Guild, MD  magnesium oxide (MAG-OX) 400 MG tablet Take 1 tablet (400 mg total) by mouth 2 (two) times daily. Patient taking differently: Take 400 mg by mouth daily. 10/10/17  Yes Dunn, Nedra Hai, PA-C   meclizine (ANTIVERT) 25 MG tablet Take 25 mg by mouth 3 (three) times daily as needed for dizziness.    Yes [provider]  omeprazole (PRILOSEC) 40 MG capsule Take 40 mg by mouth daily.   Yes [provider]  potassium chloride SA (KLOR-CON M) 20 MEQ tablet Take 2 tablets (40 mEq total) by mouth daily. 08/30/2021 dose increase Patient taking differently: Take 20 mEq by mouth daily. 08/30/21  Yes Branch, Alphonse Guild, MD  metFORMIN (GLUCOPHAGE) 1000 MG tablet Take 1,000 mg by mouth 2 (two) times daily. As needed Patient not taking: Reported on 02/25/2022 10/26/21   [provider]    Current Facility-Administered Medications  Medication Dose Route Frequency Provider Last Rate Last Admin   acetaminophen (TYLENOL) tablet 650 mg  650 mg Oral Q6H PRN  Bernadette Hoit, DO       Or   acetaminophen (TYLENOL) suppository 650 mg  650 mg Rectal Q6H PRN Adefeso, Oladapo, DO       albuterol (PROVENTIL) (2.5 MG/3ML) 0.083% nebulizer solution 2.5 mg  2.5 mg Nebulization Q6H PRN Tat, Shanon Brow, MD       aspirin EC tablet 81 mg  81 mg Oral Daily Adefeso, Oladapo, DO   81 mg at 02/25/22 0919   atorvastatin (LIPITOR) tablet 80 mg  80 mg Oral q1800 Adefeso, Oladapo, DO       insulin aspart (novoLOG) injection 0-5 Units  0-5 Units Subcutaneous QHS Adefeso, Oladapo, DO       insulin aspart (novoLOG) injection 0-9 Units  0-9 Units Subcutaneous TID WC Adefeso, Oladapo, DO   1 Units at 02/25/22 0747   mometasone-formoterol (DULERA) 200-5 MCG/ACT inhaler 2 puff  2 puff Inhalation BID Adefeso, Oladapo, DO   2 puff at 02/25/22 0742   pantoprazole (PROTONIX) injection 40 mg  40 mg Intravenous Q12H Adefeso, Oladapo, DO   40 mg at 02/25/22 0919   prochlorperazine (COMPAZINE) injection 10 mg  10 mg Intravenous Q6H PRN Adefeso, Oladapo, DO        Allergies as of 02/24/2022 - Review Complete 02/24/2022  Allergen Reaction Noted   Beta adrenergic blockers Other (See Comments) 09/20/2014   Calcium channel  blockers Other (See Comments) 09/20/2014   Naproxen Hives 09/14/2012    Family History  Problem Relation Age of Onset   Breast cancer Mother    Diabetes Father    Heart disease Father    Congestive Heart Failure Brother    Anemia Daughter    Seizures Daughter    Migraines Daughter    Diabetes Daughter    Hypertension Daughter    Bladder Cancer Son    Congestive Heart Failure Son    Congestive Heart Failure Son    Colon cancer Neg Hx    Colon polyps Neg Hx     Social History   Socioeconomic History   Marital status: Married    Spouse name: [name]   Number of children: 5   Years of education: Not on file   Highest education level: Not on file  Occupational History   Not on file  Tobacco Use   Smoking status: Former    Packs/day: 0.50    Years: 35.00    Total pack years: 17.50    Types: Cigarettes    Quit date: 03/25/1978    Years since quitting: 43.9    Passive exposure: Never   Smokeless tobacco: Never   Tobacco comments:    quit more than 30 years ago  Vaping Use   Vaping Use: Never used  Substance and Sexual Activity   Alcohol use: No   Drug use: No   Sexual activity: Not on file  Other Topics Concern   Not on file  Social History Narrative   Not on file   Social Determinants of Health   Financial Resource Strain: Low Risk  (06/29/2019)   Overall Financial Resource Strain (CARDIA)    Difficulty of Paying Living Expenses: Not hard at all  Food Insecurity: No Food Insecurity (06/29/2019)   Hunger Vital Sign    Worried About Running Out of Food in the Last Year: Never true    Ran Out of Food in the Last Year: Never true  Transportation Needs: No Transportation Needs (06/29/2019)   PRAPARE - Hydrologist (Medical): No  Lack of Transportation (Non-Medical): No  Physical Activity: Inactive (06/29/2019)   Exercise Vital Sign    Days of Exercise per Week: 0 days    Minutes of Exercise per Session: 0 min  Stress: No Stress Concern  Present (06/29/2019)   [place]    Feeling of Stress : Not at all  Social Connections: Moderately Integrated (06/29/2019)   Social Connection and Isolation Panel [NHANES]    Frequency of Communication with Friends and Family: More than three times a week    Frequency of Social Gatherings with Friends and Family: More than three times a week    Attends Religious Services: More than 4 times per year    Active Member of [employer] or Organizations: No    Attends Archivist Meetings: Never    Marital Status: Married  Human resources officer Violence: Not At Risk (06/29/2019)   Humiliation, Afraid, Rape, and Kick questionnaire    Fear of Current or Ex-Partner: No    Emotionally Abused: No    Physically Abused: No    Sexually Abused: No     Review of Systems   Gen: see HPI CV: see HPI Resp:see HPI GI: see HPI GU : Denies urinary burning, blood in urine, urinary frequency, and urinary incontinence. MS: Denies joint pain, limitation of movement, swelling, cramps, and atrophy.  Derm: Denies rash, itching, dry skin, hives. Psych: Denies depression, anxiety, memory loss, hallucinations, and confusion. Heme: Denies bruising or bleeding Neuro:  Denies any headaches, dizziness, paresthesias, shaking  Physical Exam   Vital Signs in last 24 hours: Temp:  [97.7 F (36.5 C)-98.7 F (37.1 C)] 98.7 F (37.1 C) (12/04 0918) Pulse Rate:  [44-121] 61 (12/04 0930) Resp:  [18-30] 30 (12/04 0930) BP: (107-120)/(48-61) 116/55 (12/04 0930) SpO2:  [98 %-100 %] 98 % (12/04 0930) Weight:  [79.4 kg] 79.4 kg (12/03 2337)    General:   Alert,  pleasant, appears fatigued, no distress Head:  Normocephalic and atraumatic. Eyes:  Sclera clear, no icterus.    Ears:  Normal auditory acuity. Lungs:  Clear throughout to auscultation. 2 liters nasal cannula Heart:  S1 S2 present, irregular with systolic murmur  Abdomen:  Soft, nontender and  nondistended. No masses, hepatosplenomegaly or hernias noted. Normal bowel sounds, without guarding, and without rebound.   Rectal: deferred   Msk:  Symmetrical without gross deformities. Normal posture. Extremities:  With trace lower extremity edema Neurologic:  Alert and  oriented x4. Skin:  Intact without significant lesions or rashes. Psych:  Alert and cooperative. Normal mood and affect.  Intake/Output from previous day: No intake/output data recorded. Intake/Output this shift: No intake/output data recorded.   Labs/Studies   Recent Labs Recent Labs    02/25/22 0004  WBC 6.5  HGB 7.4*  HCT 25.4*  PLT 255   BMET Recent Labs    02/25/22 0004 02/25/22 0238 02/25/22 0637 02/25/22 0943  NA 140  --  138  --   K 5.7* 5.8* 5.7* 5.1  CL 99  --  100  --   CO2 31  --  30  --   GLUCOSE 188*  --  152*  --   BUN 34*  --  37*  --   CREATININE 2.09*  --  1.92*  --   CALCIUM 8.9  --  8.5*  --     Radiology/Studies US Carotid Bilateral  Result Date: 02/25/2022 CLINICAL DATA:  Syncope Hypertension Hyperlipidemia Diabetes Remote tobacco use history  EXAM: BILATERAL CAROTID DUPLEX ULTRASOUND TECHNIQUE: Pearline Cables scale imaging, color Doppler and duplex ultrasound were performed of bilateral carotid and vertebral arteries in the neck. COMPARISON:  None available FINDINGS: Criteria: Quantification of carotid stenosis is based on velocity parameters that correlate the residual internal carotid diameter with NASCET-based stenosis levels, using the diameter of the distal internal carotid lumen as the denominator for stenosis measurement. The following velocity measurements were obtained: RIGHT ICA: 117/24 cm/sec CCA: 99/83 cm/sec SYSTOLIC ICA/CCA RATIO:  1.2 ECA: 241 cm/sec LEFT ICA: 244/46 cm/sec CCA: 38/25 cm/sec SYSTOLIC ICA/CCA RATIO:  3.0 ECA: 100 cm/sec RIGHT CAROTID ARTERY: Mild to moderate shadowing calcified plaque noted at the carotid bifurcation. RIGHT VERTEBRAL ARTERY:  Antegrade flow.  LEFT CAROTID ARTERY: Moderate shadowing calcified plaque of the carotid bifurcation. LEFT VERTEBRAL ARTERY:  Antegrade flow. IMPRESSION: 1. 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 2. Less than 50% stenosis of the right internal carotid artery. Electronically Signed   By: Miachel Roux M.D.   On: 02/25/2022 09:31   DG Chest Port 1 View  Result Date: 02/25/2022 CLINICAL DATA:  Syncope. EXAM: PORTABLE CHEST 1 VIEW COMPARISON:  October 21, 2021 FINDINGS: Multiple sternal wires and vascular clips are noted. The cardiac silhouette is mildly enlarged and unchanged in size. There is stable mild to moderate severity elevation of the right hemidiaphragm. Chronic appearing increased lung markings are seen without evidence of an acute infiltrate, pleural effusion or pneumothorax. Multilevel degenerative changes are seen throughout the thoracic spine. IMPRESSION: 1. Evidence of prior median sternotomy/CABG. 2. Chronic appearing increased lung markings without acute cardiopulmonary disease. Electronically Signed   By: Virgina Norfolk M.D.   On: 02/25/2022 02:40     Assessment   KALLYN DEMARCUS is a 76 y.o. year old female with a history of CAD s/p CABG in 2019, heart failure, DM, hyperlipidemia, lung cancer in 2021 s/p right upper lobectomy, history of PE, COPD, pulmonary hypertension, presenting to the ED with syncopal episode. Presented to the ED with reports of SOB and DOE for several months, worsening over past few weeks. Admitted with acute blood loss anemia with heme positive stool, acute on chronic heart failure, hyperkalemia, acute on chronic renal failure.    Acute blood loss anemia: Hgb 7.4 in the ED with last on file 10.7 in Aug 2023. Appears baseline is in 9/10 range. Reported melena for several weeks as outpatient but brown in the ED. Heme positive. Last colonoscopy/EGD in 2020 by Dr. Ladona Horns in [place] due  to [place]. However, I don't have access to these reports, and patient is limited historian regarding this. No NSAIDs other than 81 mg aspirin daily and does take omeprazole daily. Recommend diagnostic EGD when optimized from cardiopulmonary standpoint. Denies family history of colon cancer or polyps.   Acute on chronic CHF: appreciate Cardiology input.  Hyperkalemia: resolved s/p Lokelma.   Plan / Recommendations    May have clear liquids NPO after midnight PPI BID Transfuse as needed Serial H/H Anticipate EGD tomorrow by Dr. Jenetta Downer. Discussed risks and benefits with patient.  Will reassess in am.      02/25/2022, 10:57 AM  Annitta Needs, PhD, ANP-BC [facility]

## 2022-02-25 NOTE — Progress Notes (Addendum)
PROGRESS NOTE  Karen Tate PYP:[id number] DOB: 1945-12-10 DOA: 02/24/2022 PCP: Neale Burly, MD  Brief History:  76 year old female with a history of coronary artery disease status post CABG 2019, HFpEF, diabetes mellitus type 2, hyperlipidemia, postoperative atrial fibrillation, non-small cell lung cancer status post lobectomy 05/13/2019, COPD, chronic respiratory failure on 2 L and pulmonary hypertension presenting with a syncopal episode.  The patient spouse was helping the patient get back into the bedroom when she had a syncopal episode lasting about 2 minutes.  There is no postictal symptoms.  There is no tonic-clonic activity.  However, the patient states that she has had increasing shortness of breath and dyspnea on exertion for the past 2 months, worst in the past 2 to 3 weeks.  She denies any chest pain, coughing, hemoptysis.  She has not had any fevers, chills, nausea, vomiting, diarrhea, but she states that she has been having some melanotic stools. In the ED, the patient was afebrile and hemodynamically stable with oxygen saturation 100% on room 2 L.  WBC 6.5, hemoglobin 7.4, platelets 255,000.  FOBT was positive. Sodium 140, potassium 5.7, bicarbonate 31, serum creatinine 2.09.  Chest x-ray showed increased interstitial markings. Cardiology and GI were consulted to assist with management.   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 -Baseline creatinine 1.0-1.3 -Patient presented with serum creatinine 2.09 -Monitor with diuresis -Hold lisinopril  Hyperkalemia -Presented with serum potassium 5.7 -Lokelma given -repeat BMP  Diabetes mellitus type 2 with hyperglycemia -11/12/2021 hemoglobin A1c 7.5 -NovoLog sliding scale -Holding metformin  Essential hypertension -Holding carvedilol secondary bradycardia -Hold lisinopril secondary to hyperkalemia and renal failure -Hold amlodipine secondary to soft blood pressure  COPD -Stable without exacerbation -Continue Advair  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 Delton Coombes  Mixed Hyperlipidemia -continue statin         Family Communication:   spouse updated at bedside 12/4  Consultants:  GI, cardiology  Code Status:  FULL   DVT Prophylaxis:  SCDs   Procedures: As Listed in Progress Note Above  Antibiotics: None  Total time spent 50 minutes.  Greater than 50% spent face to face counseling and coordinating care.    Subjective: Patient denies fevers, chills, headache, chest pain, dyspnea, nausea, vomiting, diarrhea, abdominal pain, dysuria, hematuria, hematochezia, and melena.   Objective: Vitals:   02/25/22 0530 02/25/22 0652 02/25/22 0730 02/25/22 0800  BP: 115/60 (!) 107/51 (!) 111/56 (!) 113/48  Pulse: (!) 53 71 (!) 46 (!) 44  Resp: (!) 26 (!) 21 (!) 27 (!) 24  Temp:      TempSrc:      SpO2: 100% 100% 98% 98%  Weight:      Height:       No intake or output data in the 24 hours ending 02/25/22 0820 Weight change:  Exam:  General:  Pt is alert, follows commands appropriately, not in acute distress HEENT: No icterus, No thrush,  No neck mass, [place]/AT Cardiovascular: IRRR, S1/S2, no rubs, no gallops Respiratory: bibasilar crackles. No wheeze Abdomen: Soft/+BS, non tender, non distended, no guarding Extremities: 1+LE edema, No lymphangitis, No petechiae, No rashes, no  synovitis   Data Reviewed: I have personally reviewed following labs and imaging studies Basic Metabolic Panel: Recent Labs  Lab 02/25/22 0004 02/25/22 0238 02/25/22 0637  NA 140  --  138  K 5.7* 5.8* 5.7*  CL 99  --  100  CO2 31  --  30  GLUCOSE 188*  --  152*  BUN 34*  --  37*  CREATININE 2.09*  --  1.92*  CALCIUM 8.9  --  8.5*   Liver Function Tests: No results for input(s): "AST", "ALT", "ALKPHOS", "BILITOT", "PROT", "ALBUMIN" in the last 168 hours. No results for input(s): "LIPASE", "AMYLASE" in the last 168 hours. No results for input(s): "AMMONIA" in the last 168 hours. Coagulation Profile: No results for input(s): "INR", "PROTIME" in the last 168 hours. CBC: Recent Labs  Lab 02/25/22 0004  WBC 6.5  HGB 7.4*  HCT 25.4*  MCV 98.1  PLT 255   Cardiac Enzymes: No results for input(s): "CKTOTAL", "CKMB", "CKMBINDEX", "TROPONINI" in the last 168 hours. BNP: Invalid input(s): "POCBNP" CBG: Recent Labs  Lab 02/25/22 0008 02/25/22 0741  GLUCAP 182* 134*   HbA1C: No results for input(s): "HGBA1C" in the last 72 hours. Urine analysis:    Component Value Date/Time   COLORURINE YELLOW 10/21/2021 1300   APPEARANCEUR CLEAR 10/21/2021 1300   LABSPEC 1.013 10/21/2021 1300   PHURINE 6.0 10/21/2021 1300   GLUCOSEU NEGATIVE 10/21/2021 1300   HGBUR NEGATIVE 10/21/2021 1300   BILIRUBINUR NEGATIVE 10/21/2021 1300   KETONESUR NEGATIVE 10/21/2021 1300   PROTEINUR 30 (A) 10/21/2021 1300   NITRITE NEGATIVE 10/21/2021 1300   LEUKOCYTESUR LARGE (A) 10/21/2021 1300   Sepsis Labs: @LABRCNTIP (procalcitonin:4,lacticidven:4) )No results found for this or any previous visit (from the past 240 hour(s)).   Scheduled Meds:  aspirin EC  81 mg Oral Daily   atorvastatin  80 mg Oral q1800   insulin aspart  0-5 Units Subcutaneous QHS   insulin aspart  0-9 Units Subcutaneous TID WC   mometasone-formoterol  2 puff Inhalation BID   pantoprazole (PROTONIX) IV  40 mg Intravenous Q12H    Continuous Infusions:  Procedures/Studies: DG Chest Port 1 View  Result Date: 02/25/2022 CLINICAL DATA:  Syncope. EXAM: PORTABLE CHEST 1 VIEW COMPARISON:  October 21, 2021 FINDINGS: Multiple sternal wires and vascular clips are noted. The cardiac silhouette is mildly enlarged and unchanged in size. There is stable mild to moderate severity elevation of the right hemidiaphragm. Chronic appearing increased lung markings are seen without evidence of an acute infiltrate, pleural effusion or pneumothorax. Multilevel degenerative changes are seen throughout the thoracic spine. IMPRESSION: 1. Evidence of prior median sternotomy/CABG. 2. Chronic appearing increased lung markings without acute cardiopulmonary disease. Electronically Signed   By: Virgina Norfolk M.D.   On: 02/25/2022 02:40    Orson Eva, DO  [facility]  If 7PM-7AM, please contact night-coverage [email or web address] Password [email or web address] 02/25/2022, 8:20 AM   LOS: 0 days

## 2022-02-25 NOTE — Consult Note (Signed)
Gastroenterology Consult   Referring Provider: Dr. Bernadette Hoit  Primary Care Physician:  Neale Burly, MD Primary Gastroenterologist:  previously unassigned, Dr. Abbey Chatters   Patient ID: Karen Tate; [id number]; [birth year]   Admit date: 02/24/2022  LOS: 0 days   Date of Consultation: 02/25/2022  Reason for Consultation:  GI bleed   History of Present Illness   Karen Tate is a 76 y.o. year old female with a history of CAD s/p CABG in 2019, heart failure, DM, hyperlipidemia, lung cancer in 2021 s/p right upper lobectomy, history of PE, COPD, pulmonary hypertension, presenting to the ED with syncopal episode. Presented to the ED with reports of SOB and DOE for several months, worsening over past few weeks. Admitted with acute blood loss anemia, acute on chronic heart failure, hyperkalemia, acute on chronic renal failure.   In ED: Hgb 7.4, previously 10.7 several months ago. Heme positive but stool is brown. Ferritin 281, iron 36. Hyperkalemia with potassium 5.8 and repeat pending. BNP 459. Afib with slow VR.   Cardiology has seen patient this morning. Suspect syncope was vasovagal response. She notes fatigue worsening past several weeks. States stool dark/black for past few weeks. No abdominal pain. Takes 81 mg aspirin daily. Otherwise, no NSAIDs. Prilosec daily. Denies abdominal pain, N/V. Denies dysphagia. Notes poor appetite recently. Feels short of breath worse from baseline currently. Wears 2 liters nasal cannula continuously at home.   Denies FH colon cancer or polyps. Appears she had colonoscopy/EGD in 2020 by Dr. Ladona Horns  in [place] due to [place]. I don't have access to those reports but do see under labs H.pylori negative serology.    Past Medical History:  Diagnosis Date   Anxiety neurosis    Arthritis    Asthma    CAD (coronary artery disease)    a. 08/2017: abnormal nuc-> sent directly to [facility], NSTEMI, s/p CABG (left internal mammary artery to left anterior descending,  sequential saphenous vein graft to ramus intermedius branches 1 and 2).   Carotid artery disease (HCC)    Chronic anemia    Dizziness    GERD (gastroesophageal reflux disease)    Glaucoma    Heart disease    Hiatal hernia    Hyperlipemia    Hypertension    Ischemic cardiomyopathy    Joint pain    Mitral regurgitation    Postoperative atrial fibrillation ([place]) 08/2017   Pulmonary embolus ([place])    Type 2 diabetes mellitus ([place])     Past Surgical History:  Procedure Laterality Date   BACK SURGERY  2016   CORONARY ARTERY BYPASS GRAFT N/A 09/18/2017   Procedure: CORONARY ARTERY BYPASS GRAFTING (CABG);  Surgeon: [name], MD;  Location: [place];  Service: Open Heart Surgery;  Laterality: N/A;  Saphenous vein harvest. LIMA to LAD Saphenous vein (sequential) ramus 1 & 2   EYE SURGERY Bilateral    "surgery for glaucoma"   HERNIA REPAIR     INTERCOSTAL NERVE BLOCK Right 05/13/2019   Procedure: Intercostal Nerve Block;  Surgeon: [name], MD;  [facility];  Service: Thoracic;  Laterality: Right;   LEFT HEART CATH AND CORONARY ANGIOGRAPHY N/A 09/15/2017   Procedure: LEFT HEART CATH AND CORONARY ANGIOGRAPHY;  Surgeon: Lorretta Harp, MD;  Location: [place] CV LAB;  Service: Cardiovascular;  Laterality: N/A;   LYMPH NODE DISSECTION Right 05/13/2019   Procedure: Lymph Node Dissection;  Surgeon: [name], MD;  Location: [place];  Service: Thoracic;  Laterality:  Right;   ROTATOR CUFF REPAIR Left    x2   SHOULDER SURGERY     LEFT   TEE WITHOUT CARDIOVERSION N/A 09/18/2017   Procedure: TRANSESOPHAGEAL ECHOCARDIOGRAM (TEE);  Surgeon: [name], MD;  Location: [place];  Service: Open Heart Surgery;  Laterality: N/A;   TOTAL KNEE ARTHROPLASTY     LEFT   VAGINAL HYSTERECTOMY     partial    Prior to Admission medications   Medication Sig Start Date End Date Taking? Authorizing Provider  acetaminophen (TYLENOL) 500 MG tablet Take 1,000 mg by  mouth every 6 (six) hours as needed for moderate pain.   Yes [provider]  albuterol (VENTOLIN HFA) 108 (90 Base) MCG/ACT inhaler Inhale 2 puffs into the lungs every 6 (six) hours as needed. Patient taking differently: Inhale 2 puffs into the lungs every 6 (six) hours as needed for wheezing or shortness of breath. 03/01/21  Yes Chesley Mires, MD  ALPRAZolam Duanne Moron) 0.5 MG tablet Take 0.5 mg by mouth 2 (two) times daily.   Yes [provider]  amLODipine (NORVASC) 10 MG tablet Take 10 mg by mouth daily.   Yes [provider]  aspirin 81 MG tablet Take 1 tablet (81 mg total) by mouth daily. 10/20/17  Yes Lars Pinks M, PA-C  atorvastatin (LIPITOR) 80 MG tablet Take 1 tablet (80 mg total) by mouth daily at 6 PM. 11/17/17  Yes Herminio Commons, MD  carvedilol (COREG) 25 MG tablet Take 1 tablet by mouth 2 (two) times daily. 01/14/20  Yes [provider]  fish oil-omega-3 fatty acids 1000 MG capsule Take 1 g by mouth 3 (three) times daily.    Yes [provider]  fluticasone-salmeterol (ADVAIR) 250-50 MCG/ACT AEPB Inhale 1 puff into the lungs in the morning and at bedtime. Advair 250/50 one puff 2 times/ day 03/30/21  Yes Chesley Mires, MD  folic acid (FOLVITE) 1 MG tablet TAKE 1 TABLET EVERY DAY Patient taking differently: Take 1 mg by mouth daily. 02/20/21  Yes Pennington, Rebekah M, PA-C  furosemide (LASIX) 20 MG tablet Take 2 tablets (40 mg total) by mouth in the morning. & 20 mg in the late afternoon Patient taking differently: Take 40 mg by mouth in the morning. 08/30/21  Yes Branch, Alphonse Guild, MD  lisinopril (ZESTRIL) 40 MG tablet TAKE 1 TABLET EVERY DAY ( DOSE INCREASE ) Patient taking differently: Take 40 mg by mouth daily. 08/23/20  Yes Branch, Alphonse Guild, MD  magnesium oxide (MAG-OX) 400 MG tablet Take 1 tablet (400 mg total) by mouth 2 (two) times daily. Patient taking differently: Take 400 mg by mouth daily. 10/10/17  Yes Dunn, Nedra Hai, PA-C   meclizine (ANTIVERT) 25 MG tablet Take 25 mg by mouth 3 (three) times daily as needed for dizziness.    Yes [provider]  omeprazole (PRILOSEC) 40 MG capsule Take 40 mg by mouth daily.   Yes [provider]  potassium chloride SA (KLOR-CON M) 20 MEQ tablet Take 2 tablets (40 mEq total) by mouth daily. 08/30/2021 dose increase Patient taking differently: Take 20 mEq by mouth daily. 08/30/21  Yes Branch, Alphonse Guild, MD  metFORMIN (GLUCOPHAGE) 1000 MG tablet Take 1,000 mg by mouth 2 (two) times daily. As needed Patient not taking: Reported on 02/25/2022 10/26/21   [provider]    Current Facility-Administered Medications  Medication Dose Route Frequency Provider Last Rate Last Admin   acetaminophen (TYLENOL) tablet 650 mg  650 mg Oral Q6H PRN  Bernadette Hoit, DO       Or   acetaminophen (TYLENOL) suppository 650 mg  650 mg Rectal Q6H PRN Adefeso, Oladapo, DO       albuterol (PROVENTIL) (2.5 MG/3ML) 0.083% nebulizer solution 2.5 mg  2.5 mg Nebulization Q6H PRN Tat, Shanon Brow, MD       aspirin EC tablet 81 mg  81 mg Oral Daily Adefeso, Oladapo, DO   81 mg at 02/25/22 0919   atorvastatin (LIPITOR) tablet 80 mg  80 mg Oral q1800 Adefeso, Oladapo, DO       insulin aspart (novoLOG) injection 0-5 Units  0-5 Units Subcutaneous QHS Adefeso, Oladapo, DO       insulin aspart (novoLOG) injection 0-9 Units  0-9 Units Subcutaneous TID WC Adefeso, Oladapo, DO   1 Units at 02/25/22 0747   mometasone-formoterol (DULERA) 200-5 MCG/ACT inhaler 2 puff  2 puff Inhalation BID Adefeso, Oladapo, DO   2 puff at 02/25/22 0742   pantoprazole (PROTONIX) injection 40 mg  40 mg Intravenous Q12H Adefeso, Oladapo, DO   40 mg at 02/25/22 0919   prochlorperazine (COMPAZINE) injection 10 mg  10 mg Intravenous Q6H PRN Adefeso, Oladapo, DO        Allergies as of 02/24/2022 - Review Complete 02/24/2022  Allergen Reaction Noted   Beta adrenergic blockers Other (See Comments) 09/20/2014   Calcium channel  blockers Other (See Comments) 09/20/2014   Naproxen Hives 09/14/2012    Family History  Problem Relation Age of Onset   Breast cancer Mother    Diabetes Father    Heart disease Father    Congestive Heart Failure Brother    Anemia Daughter    Seizures Daughter    Migraines Daughter    Diabetes Daughter    Hypertension Daughter    Bladder Cancer Son    Congestive Heart Failure Son    Congestive Heart Failure Son    Colon cancer Neg Hx    Colon polyps Neg Hx     Social History   Socioeconomic History   Marital status: Married    Spouse name: [name]   Number of children: 5   Years of education: Not on file   Highest education level: Not on file  Occupational History   Not on file  Tobacco Use   Smoking status: Former    Packs/day: 0.50    Years: 35.00    Total pack years: 17.50    Types: Cigarettes    Quit date: 03/25/1978    Years since quitting: 43.9    Passive exposure: Never   Smokeless tobacco: Never   Tobacco comments:    quit more than 30 years ago  Vaping Use   Vaping Use: Never used  Substance and Sexual Activity   Alcohol use: No   Drug use: No   Sexual activity: Not on file  Other Topics Concern   Not on file  Social History Narrative   Not on file   Social Determinants of Health   Financial Resource Strain: Low Risk  (06/29/2019)   Overall Financial Resource Strain (CARDIA)    Difficulty of Paying Living Expenses: Not hard at all  Food Insecurity: No Food Insecurity (06/29/2019)   Hunger Vital Sign    Worried About Running Out of Food in the Last Year: Never true    Ran Out of Food in the Last Year: Never true  Transportation Needs: No Transportation Needs (06/29/2019)   PRAPARE - Hydrologist (Medical): No  Lack of Transportation (Non-Medical): No  Physical Activity: Inactive (06/29/2019)   Exercise Vital Sign    Days of Exercise per Week: 0 days    Minutes of Exercise per Session: 0 min  Stress: No Stress Concern  Present (06/29/2019)   [place]    Feeling of Stress : Not at all  Social Connections: Moderately Integrated (06/29/2019)   Social Connection and Isolation Panel [NHANES]    Frequency of Communication with Friends and Family: More than three times a week    Frequency of Social Gatherings with Friends and Family: More than three times a week    Attends Religious Services: More than 4 times per year    Active Member of [employer] or Organizations: No    Attends Archivist Meetings: Never    Marital Status: Married  Human resources officer Violence: Not At Risk (06/29/2019)   Humiliation, Afraid, Rape, and Kick questionnaire    Fear of Current or Ex-Partner: No    Emotionally Abused: No    Physically Abused: No    Sexually Abused: No     Review of Systems   Gen: see HPI CV: see HPI Resp:see HPI GI: see HPI GU : Denies urinary burning, blood in urine, urinary frequency, and urinary incontinence. MS: Denies joint pain, limitation of movement, swelling, cramps, and atrophy.  Derm: Denies rash, itching, dry skin, hives. Psych: Denies depression, anxiety, memory loss, hallucinations, and confusion. Heme: Denies bruising or bleeding Neuro:  Denies any headaches, dizziness, paresthesias, shaking  Physical Exam   Vital Signs in last 24 hours: Temp:  [97.7 F (36.5 C)-98.7 F (37.1 C)] 98.7 F (37.1 C) (12/04 0918) Pulse Rate:  [44-121] 61 (12/04 0930) Resp:  [18-30] 30 (12/04 0930) BP: (107-120)/(48-61) 116/55 (12/04 0930) SpO2:  [98 %-100 %] 98 % (12/04 0930) Weight:  [79.4 kg] 79.4 kg (12/03 2337)    General:   Alert,  pleasant, appears fatigued, no distress Head:  Normocephalic and atraumatic. Eyes:  Sclera clear, no icterus.    Ears:  Normal auditory acuity. Lungs:  Clear throughout to auscultation. 2 liters nasal cannula Heart:  S1 S2 present, irregular with systolic murmur  Abdomen:  Soft, nontender and  nondistended. No masses, hepatosplenomegaly or hernias noted. Normal bowel sounds, without guarding, and without rebound.   Rectal: deferred   Msk:  Symmetrical without gross deformities. Normal posture. Extremities:  With trace lower extremity edema Neurologic:  Alert and  oriented x4. Skin:  Intact without significant lesions or rashes. Psych:  Alert and cooperative. Normal mood and affect.  Intake/Output from previous day: No intake/output data recorded. Intake/Output this shift: No intake/output data recorded.   Labs/Studies   Recent Labs Recent Labs    02/25/22 0004  WBC 6.5  HGB 7.4*  HCT 25.4*  PLT 255   BMET Recent Labs    02/25/22 0004 02/25/22 0238 02/25/22 0637 02/25/22 0943  NA 140  --  138  --   K 5.7* 5.8* 5.7* 5.1  CL 99  --  100  --   CO2 31  --  30  --   GLUCOSE 188*  --  152*  --   BUN 34*  --  37*  --   CREATININE 2.09*  --  1.92*  --   CALCIUM 8.9  --  8.5*  --     Radiology/Studies US Carotid Bilateral  Result Date: 02/25/2022 CLINICAL DATA:  Syncope Hypertension Hyperlipidemia Diabetes Remote tobacco use history  EXAM: BILATERAL CAROTID DUPLEX ULTRASOUND TECHNIQUE: Pearline Cables scale imaging, color Doppler and duplex ultrasound were performed of bilateral carotid and vertebral arteries in the neck. COMPARISON:  None available FINDINGS: Criteria: Quantification of carotid stenosis is based on velocity parameters that correlate the residual internal carotid diameter with NASCET-based stenosis levels, using the diameter of the distal internal carotid lumen as the denominator for stenosis measurement. The following velocity measurements were obtained: RIGHT ICA: 117/24 cm/sec CCA: 00/37 cm/sec SYSTOLIC ICA/CCA RATIO:  1.2 ECA: 241 cm/sec LEFT ICA: 244/46 cm/sec CCA: 04/88 cm/sec SYSTOLIC ICA/CCA RATIO:  3.0 ECA: 100 cm/sec RIGHT CAROTID ARTERY: Mild to moderate shadowing calcified plaque noted at the carotid bifurcation. RIGHT VERTEBRAL ARTERY:  Antegrade flow.  LEFT CAROTID ARTERY: Moderate shadowing calcified plaque of the carotid bifurcation. LEFT VERTEBRAL ARTERY:  Antegrade flow. IMPRESSION: 1. 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 2. Less than 50% stenosis of the right internal carotid artery. Electronically Signed   By: Miachel Roux M.D.   On: 02/25/2022 09:31   DG Chest Port 1 View  Result Date: 02/25/2022 CLINICAL DATA:  Syncope. EXAM: PORTABLE CHEST 1 VIEW COMPARISON:  October 21, 2021 FINDINGS: Multiple sternal wires and vascular clips are noted. The cardiac silhouette is mildly enlarged and unchanged in size. There is stable mild to moderate severity elevation of the right hemidiaphragm. Chronic appearing increased lung markings are seen without evidence of an acute infiltrate, pleural effusion or pneumothorax. Multilevel degenerative changes are seen throughout the thoracic spine. IMPRESSION: 1. Evidence of prior median sternotomy/CABG. 2. Chronic appearing increased lung markings without acute cardiopulmonary disease. Electronically Signed   By: Virgina Norfolk M.D.   On: 02/25/2022 02:40     Assessment   Karen Tate is a 76 y.o. year old female with a history of CAD s/p CABG in 2019, heart failure, DM, hyperlipidemia, lung cancer in 2021 s/p right upper lobectomy, history of PE, COPD, pulmonary hypertension, presenting to the ED with syncopal episode. Presented to the ED with reports of SOB and DOE for several months, worsening over past few weeks. Admitted with acute blood loss anemia with heme positive stool, acute on chronic heart failure, hyperkalemia, acute on chronic renal failure.    Acute blood loss anemia: Hgb 7.4 in the ED with last on file 10.7 in Aug 2023. Appears baseline is in 9/10 range. Reported melena for several weeks as outpatient but brown in the ED. Heme positive. Last colonoscopy/EGD in 2020 by Dr. Ladona Horns in [place] due  to [place]. However, I don't have access to these reports, and patient is limited historian regarding this. No NSAIDs other than 81 mg aspirin daily and does take omeprazole daily. Recommend diagnostic EGD when optimized from cardiopulmonary standpoint. Denies family history of colon cancer or polyps.   Acute on chronic CHF: appreciate Cardiology input.  Hyperkalemia: resolved s/p Lokelma.   Plan / Recommendations    May have clear liquids NPO after midnight PPI BID Transfuse as needed Serial H/H Anticipate EGD tomorrow by Dr. Jenetta Downer. Discussed risks and benefits with patient.  Will reassess in am.      02/25/2022, 10:57 AM  Annitta Needs, PhD, ANP-BC [facility] Gastroenterology

## 2022-02-25 NOTE — ED Provider Notes (Addendum)
[facility] EMERGENCY DEPARTMENT Provider Note   CSN: [id number] Arrival date & time: 02/24/22  2250     History  Chief Complaint  Patient presents with   Weakness   Loss of Consciousness    Karen Tate is a 76 y.o. female.  The history is provided by the patient and the spouse.  Weakness Associated symptoms: syncope   Loss of Consciousness Associated symptoms: weakness   She has history of hypertension, diabetes, hyperlipidemia, coronary artery disease with ischemic cardiomyopathy, carotid artery disease and comes in after syncopal episode at home.  She relates that she has been feeling generally weak for the last 2 months and has noticed increasing shortness of breath.  She denies chest pain, heaviness, tightness, pressure.  She denies nausea, vomiting, diaphoresis.  She denies headache.   Home Medications Prior to Admission medications   Medication Sig Start Date End Date Taking? Authorizing Provider  acetaminophen (TYLENOL) 500 MG tablet Take 1,000 mg by mouth every 6 (six) hours as needed for moderate pain.    [provider]  albuterol (VENTOLIN HFA) 108 (90 Base) MCG/ACT inhaler Inhale 2 puffs into the lungs every 6 (six) hours as needed. 03/01/21   Chesley Mires, MD  ALPRAZolam Duanne Moron) 0.5 MG tablet Take 0.5 mg by mouth 2 (two) times daily.    [provider]  amLODipine (NORVASC) 10 MG tablet Take 10 mg by mouth daily.    [provider]  aspirin 81 MG tablet Take 1 tablet (81 mg total) by mouth daily. 10/20/17   Nani Skillern, PA-C  atorvastatin (LIPITOR) 80 MG tablet Take 1 tablet (80 mg total) by mouth daily at 6 PM. 11/17/17   Herminio Commons, MD  carvedilol (COREG) 25 MG tablet Take 1 tablet by mouth 2 (two) times daily. 01/14/20   [provider]  diclofenac Sodium (VOLTAREN) 1 % GEL Apply 2 g topically 4 (four) times daily. Patient not taking: Reported on 01/11/2022    [provider]  fish oil-omega-3 fatty  acids 1000 MG capsule Take 1 g by mouth 3 (three) times daily.     [provider]  fluticasone-salmeterol (ADVAIR) 250-50 MCG/ACT AEPB Inhale 1 puff into the lungs in the morning and at bedtime. Advair 250/50 one puff 2 times/ day 03/30/21   Chesley Mires, MD  folic acid (FOLVITE) 1 MG tablet TAKE 1 TABLET EVERY DAY 02/20/21   Tarri Abernethy M, PA-C  furosemide (LASIX) 20 MG tablet Take 2 tablets (40 mg total) by mouth in the morning. & 20 mg in the late afternoon 08/30/21   Arnoldo Lenis, MD  latanoprost (XALATAN) 0.005 % ophthalmic solution Place 1 drop into both eyes at bedtime. 02/10/19   [provider]  lisinopril (ZESTRIL) 40 MG tablet TAKE 1 TABLET EVERY DAY ( DOSE INCREASE ) 08/23/20   Branch, Alphonse Guild, MD  magnesium oxide (MAG-OX) 400 MG tablet Take 1 tablet (400 mg total) by mouth 2 (two) times daily. 10/10/17   Dunn, Nedra Hai, PA-C  meclizine (ANTIVERT) 25 MG tablet Take 25 mg by mouth 3 (three) times daily as needed for dizziness.     [provider]  metFORMIN (GLUCOPHAGE) 1000 MG tablet Take 1,000 mg by mouth 2 (two) times daily. As needed 10/26/21   [provider]  Multiple Vitamins-Minerals (CENTRUM SILVER 50+WOMEN) TABS Take 1 tablet by mouth daily.     [provider]  omeprazole (PRILOSEC) 40 MG capsule Take 40 mg by mouth daily.  [provider]  potassium chloride SA (KLOR-CON M) 20 MEQ tablet Take 2 tablets (40 mEq total) by mouth daily. 08/30/2021 dose increase 08/30/21   Arnoldo Lenis, MD  tiZANidine (ZANAFLEX) 4 MG tablet Take 4 mg by mouth at bedtime as needed. 11/14/20   [provider]  traMADol (ULTRAM) 50 MG tablet Take 50 mg by mouth 3 (three) times daily as needed. 12/29/20   [provider]  vitamin B-12 (CYANOCOBALAMIN) 500 MCG tablet Take 500 mcg by mouth daily.    [provider]      Allergies    Beta adrenergic blockers, Calcium channel blockers, and Naproxen    Review of  Systems   Review of Systems  Cardiovascular:  Positive for syncope.  Neurological:  Positive for weakness.  All other systems reviewed and are negative.   Physical Exam Updated Vital Signs BP (!) 120/59   Pulse (!) 59   Temp 97.8 F (36.6 C) (Oral)   Resp (!) 24   Ht 5\' 5"  (1.651 m)   Wt 79.4 kg   SpO2 100%   BMI 29.12 kg/m  Physical Exam Vitals and nursing note reviewed. Exam conducted with a chaperone present.   76 year old female, resting comfortably and in no acute distress. Vital signs are significant for slow heart rate and elevated respiratory rate. Oxygen saturation is 100%, which is normal. Head is normocephalic and atraumatic. PERRLA, EOMI. Oropharynx is clear.  Conjunctivae are pale. Neck is nontender and supple without adenopathy or JVD. Back is nontender and there is no CVA tenderness. Lungs are clear without rales, wheezes, or rhonchi. Chest is nontender. Heart has regular rate and rhythm without murmur. Abdomen is soft, flat, nontender. Rectal: Normal sphincter tone.  Brown liquid stool present which is Hemoccult positive. Extremities have no cyanosis or edema, full range of motion is present. Skin is warm and dry without rash. Neurologic: Mental status is normal, cranial nerves are intact, moves all extremities equally.  ED Results / Procedures / Treatments   Labs (all labs ordered are listed, but only abnormal results are displayed) Labs Reviewed  BASIC METABOLIC PANEL - Abnormal; Notable for the following components:      Result Value   Potassium 5.7 (*)    Glucose, Bld 188 (*)    BUN 34 (*)    Creatinine, Ser 2.09 (*)    GFR, Estimated 24 (*)    All other components within normal limits  CBC - Abnormal; Notable for the following components:   RBC 2.59 (*)    Hemoglobin 7.4 (*)    HCT 25.4 (*)    MCHC 29.1 (*)    RDW 17.9 (*)    All other components within normal limits  CBG MONITORING, ED - Abnormal; Notable for the following components:    Glucose-Capillary 182 (*)    All other components within normal limits  URINALYSIS, ROUTINE W REFLEX MICROSCOPIC    EKG EKG Interpretation  Date/Time:  Sunday February 24 2022 23:42:34 EST Ventricular Rate:  55 PR Interval:    QRS Duration: 84 QT Interval:  458 QTC Calculation: 438 R Axis:   73 Text Interpretation: Atrial fibrillation with slow ventricular response Nonspecific T wave abnormality Abnormal ECG When compared with ECG of 21-Oct-2021 12:46, No significant change was found Confirmed by Delora Fuel ([id number]) on 02/24/2022 11:58:02 PM  Radiology DG Chest Port 1 View  Result Date: 02/25/2022 CLINICAL DATA:  Syncope. EXAM: PORTABLE CHEST 1 VIEW COMPARISON:  October 21, 2021 FINDINGS:  Multiple sternal wires and vascular clips are noted. The cardiac silhouette is mildly enlarged and unchanged in size. There is stable mild to moderate severity elevation of the right hemidiaphragm. Chronic appearing increased lung markings are seen without evidence of an acute infiltrate, pleural effusion or pneumothorax. Multilevel degenerative changes are seen throughout the thoracic spine. IMPRESSION: 1. Evidence of prior median sternotomy/CABG. 2. Chronic appearing increased lung markings without acute cardiopulmonary disease. Electronically Signed   By: Virgina Norfolk M.D.   On: 02/25/2022 02:40    Procedures Procedures  Cardiac monitor shows atrial fibrillation with slow ventricular response, per my interpretation  Medications Ordered in ED Medications - No data to display  ED Course/ Medical Decision Making/ A&P                           Medical Decision Making Amount and/or Complexity of Data Reviewed Labs: ordered. Radiology: ordered.  Risk Decision regarding hospitalization.   Syncopal episode in the setting of increasing exertional dyspnea and weakness.  I am concerned about an exacerbation of her known heart failure, arrhythmia.  I have reviewed and interpreted her  electrocardiogram and my interpretation is atrial fibrillation with slow ventricular response and nonspecific T wave abnormality.  Prior ECG had a regular rhythm but no P waves easily seen but was read by computer as sinus rhythm.  I have reviewed her past records, and she had an episode of postoperative atrial fibrillation following bypass surgery in 2019, but has not had any atrial fibrillation since then and she is not anticoagulated.  I have reviewed and interpreted her laboratory tests, and my interpretation is renal insufficiency which is significantly worse than on 11/07/2021 (creatinine 1.33 on 8/16, 2.09 today).  Elevated potassium level possibly secondary to hemolysis, I have ordered a repeat potassium level.  Normochromic normocytic anemia which is significantly worse compared with 11/07/2021 (hemoglobin 10.7 on 8/16, 7.4 today).  She does have positive stool Hemoccult, will need GI workup to identify source of blood loss.  She will also need hospital admission for optimization of her renal function and evaluation of apparently new atrial fibrillation.  Clearly with Hemoccult positive stool and falling hemoglobin, she is not a candidate for anticoagulation at this point.  She is on a beta-blocker and dose may need to be reduced given her slow ventricular response.  Chest x-ray shows no acute process.  Have independently viewed the image, and agree with radiologist's interpretation.  Because of syncope, I have ordered a troponin level.  Case is discussed with Dr. Josephine Cables of [facility], who agrees to admit the patient.  CRITICAL CARE Performed by: Delora Fuel Total critical care time: 35 minutes Critical care time was exclusive of separately billable procedures and treating other patients. Critical care was necessary to treat or prevent imminent or life-threatening deterioration. Critical care was time spent personally by me on the following activities: development of treatment plan with  patient and/or surrogate as well as nursing, discussions with consultants, evaluation of patient's response to treatment, examination of patient, obtaining history from patient or surrogate, ordering and performing treatments and interventions, ordering and review of laboratory studies, ordering and review of radiographic studies, pulse oximetry and re-evaluation of patient's condition.  CHA2DS2/VAS Stroke Risk Points  Current as of 13 minutes ago     9 >= 2 Points: High Risk  1 - 1.99 Points: Medium Risk  0 Points: Low Risk    No Change  Details    This score determines the patient's risk of having a stroke if the  patient has atrial fibrillation.       Points Metrics  1 Has Congestive Heart Failure:  Yes    Current as of 13 minutes ago  1 Has Vascular Disease:  Yes    Current as of 13 minutes ago  1 Has Hypertension:  Yes    Current as of 13 minutes ago  2 Age:  30    Current as of 13 minutes ago  1 Has Diabetes:  Yes    Current as of 13 minutes ago  2 Had Stroke:  No  Had TIA:  No  Had Thromboembolism:  Yes    Current as of 13 minutes ago  1 Female:  Yes    Current as of 13 minutes ago        Final Clinical Impression(s) / ED Diagnoses Final diagnoses:  Syncope, unspecified syncope type  Acute kidney injury (nontraumatic) (HCC)  Rectal bleed  Normocytic anemia  Atrial fibrillation with slow ventricular response (HCC)  Hyperkalemia    Rx / DC Orders ED Discharge Orders     None         Delora Fuel, MD 16/10/96 [id number]    Delora Fuel, MD 09/81/19 [phone]

## 2022-02-25 NOTE — Consult Note (Addendum)
Cardiology Consultation   Patient ID: Karen Tate MRN: [id number]; DOB: 1945/06/24  Admit date: 02/24/2022 Date of Consult: 02/25/2022  PCP:  Neale Burly, MD   [place] [facility] Cardiologist:  Carlyle Dolly, MD        Patient Profile:   Karen Tate is a 76 y.o. female with a hx of CAD who is being seen 02/25/2022 for the evaluation of syncope and new Afib with slow VR in the setting of GI bleed at the request of Dr. Carles Collet.  History of Present Illness:   Karen Tate is a 76 year old female patient of Dr. Harl Bowie with history of CAD status post CABG 08/2017, hypertension, carotid stenosis 40 to 99% LICA 10/3380, HLD, moderate MR, chronic diastolic CHF with grade 2 DD, pulmonary hypertension, history of non-small cell lung CA with prior right upper lobe lobectomy, history of PE PE, COPD on home O2.  Patient presents to the emergency room with an episode of syncope in the setting of GI bleed hemoglobin of 7.4, potassium 5.7 creatinine 1.92.  She has had black stools for approximately 1 month and progressive weakness and shortness of breath.  She got up to go to the bathroom when she passed out.  In the ER she was tachypneic and bradycardic in A-fib although review of EKG's look like she may have been in Afib 10/21/21. Patient denies chest pain, dizziness, palpitations.    Past Medical History:  Diagnosis Date   Anxiety neurosis    Arthritis    Asthma    CAD (coronary artery disease)    a. 08/2017: abnormal nuc-> sent directly to [facility], NSTEMI, s/p CABG (left internal mammary artery to left anterior descending, sequential saphenous vein graft to ramus intermedius branches 1 and 2).   Carotid artery disease (HCC)    Chronic anemia    Dizziness    GERD (gastroesophageal reflux disease)    Glaucoma    Heart disease    Hiatal hernia    Hyperlipemia    Hypertension    Ischemic cardiomyopathy    Joint pain    Mitral regurgitation    Postoperative atrial fibrillation  ([place]) 08/2017   Pulmonary embolus ([place])    Type 2 diabetes mellitus ([place])     Past Surgical History:  Procedure Laterality Date   BACK SURGERY  2016   CORONARY ARTERY BYPASS GRAFT N/A 09/18/2017   Procedure: CORONARY ARTERY BYPASS GRAFTING (CABG);  Surgeon: [name], MD;  Location: [place];  Service: Open Heart Surgery;  Laterality: N/A;  Saphenous vein harvest. LIMA to LAD Saphenous vein (sequential) ramus 1 & 2   EYE SURGERY Bilateral    "surgery for glaucoma"   HERNIA REPAIR     INTERCOSTAL NERVE BLOCK Right 05/13/2019   Procedure: Intercostal Nerve Block;  Surgeon: [name], MD;  Location: [facility];  Service: Thoracic;  Laterality: Right;   LEFT HEART CATH AND CORONARY ANGIOGRAPHY N/A 09/15/2017   Procedure: LEFT HEART CATH AND CORONARY ANGIOGRAPHY;  Surgeon: Lorretta Harp, MD;  Location: [facility];  Service: Cardiovascular;  Laterality: N/A;   LYMPH NODE DISSECTION Right 05/13/2019   Procedure: Lymph Node Dissection;  Surgeon: [name], MD;  Location: [place];  Service: Thoracic;  Laterality: Right;   ROTATOR CUFF REPAIR Left    x2   SHOULDER SURGERY     LEFT   TEE WITHOUT CARDIOVERSION N/A 09/18/2017   Procedure: TRANSESOPHAGEAL ECHOCARDIOGRAM (TEE);  Surgeon: [name], MD;  Location: [facility] OR;  Service: Open Heart Surgery;  Laterality: N/A;   TOTAL KNEE ARTHROPLASTY     LEFT   VAGINAL HYSTERECTOMY     partial     Home Medications:  Prior to Admission medications   Medication Sig Start Date End Date Taking? Authorizing Provider  acetaminophen (TYLENOL) 500 MG tablet Take 1,000 mg by mouth every 6 (six) hours as needed for moderate pain.    [provider]  albuterol (VENTOLIN HFA) 108 (90 Base) MCG/ACT inhaler Inhale 2 puffs into the lungs every 6 (six) hours as needed. 03/01/21   Chesley Mires, MD  ALPRAZolam Duanne Moron) 0.5 MG tablet Take 0.5 mg by mouth 2 (two) times daily.    [provider]  amLODipine  (NORVASC) 10 MG tablet Take 10 mg by mouth daily.    [provider]  aspirin 81 MG tablet Take 1 tablet (81 mg total) by mouth daily. 10/20/17   Nani Skillern, PA-C  atorvastatin (LIPITOR) 80 MG tablet Take 1 tablet (80 mg total) by mouth daily at 6 PM. 11/17/17   Herminio Commons, MD  carvedilol (COREG) 25 MG tablet Take 1 tablet by mouth 2 (two) times daily. 01/14/20   [provider]  diclofenac Sodium (VOLTAREN) 1 % GEL Apply 2 g topically 4 (four) times daily. Patient not taking: Reported on 01/11/2022    [provider]  fish oil-omega-3 fatty acids 1000 MG capsule Take 1 g by mouth 3 (three) times daily.     [provider]  fluticasone-salmeterol (ADVAIR) 250-50 MCG/ACT AEPB Inhale 1 puff into the lungs in the morning and at bedtime. Advair 250/50 one puff 2 times/ day 03/30/21   Chesley Mires, MD  folic acid (FOLVITE) 1 MG tablet TAKE 1 TABLET EVERY DAY 02/20/21   Tarri Abernethy M, PA-C  furosemide (LASIX) 20 MG tablet Take 2 tablets (40 mg total) by mouth in the morning. & 20 mg in the late afternoon 08/30/21   Arnoldo Lenis, MD  latanoprost (XALATAN) 0.005 % ophthalmic solution Place 1 drop into both eyes at bedtime. 02/10/19   [provider]  lisinopril (ZESTRIL) 40 MG tablet TAKE 1 TABLET EVERY DAY ( DOSE INCREASE ) 08/23/20   Harold Moncus, Alphonse Guild, MD  magnesium oxide (MAG-OX) 400 MG tablet Take 1 tablet (400 mg total) by mouth 2 (two) times daily. 10/10/17   Dunn, Nedra Hai, PA-C  meclizine (ANTIVERT) 25 MG tablet Take 25 mg by mouth 3 (three) times daily as needed for dizziness.     [provider]  metFORMIN (GLUCOPHAGE) 1000 MG tablet Take 1,000 mg by mouth 2 (two) times daily. As needed 10/26/21   [provider]  Multiple Vitamins-Minerals (CENTRUM SILVER 50+WOMEN) TABS Take 1 tablet by mouth daily.     [provider]  omeprazole (PRILOSEC) 40 MG capsule Take 40 mg by mouth daily.    [provider]  potassium chloride SA (KLOR-CON M) 20 MEQ tablet Take 2 tablets (40 mEq total) by mouth daily. 08/30/2021 dose increase 08/30/21   Arnoldo Lenis, MD  tiZANidine (ZANAFLEX) 4 MG tablet Take 4 mg by mouth at bedtime as needed. 11/14/20   [provider]  traMADol (ULTRAM) 50 MG tablet Take 50 mg by mouth 3 (three) times daily as needed. 12/29/20   [provider]  vitamin B-12 (CYANOCOBALAMIN) 500 MCG tablet Take 500 mcg by mouth daily.    [provider]    Inpatient Medications: Scheduled Meds:  aspirin EC  81 mg Oral Daily   atorvastatin  80 mg Oral q1800   insulin aspart  0-5 Units Subcutaneous QHS   insulin aspart  0-9 Units Subcutaneous TID WC   mometasone-formoterol  2 puff Inhalation BID   pantoprazole (PROTONIX) IV  40 mg Intravenous Q12H   Continuous Infusions:  PRN Meds: acetaminophen **OR** acetaminophen, albuterol, prochlorperazine  Allergies:    Allergies  Allergen Reactions   Beta Adrenergic Blockers Other (See Comments)    Dropped heart rate to the 40's   Calcium Channel Blockers Other (See Comments)    Dropped HR into the 40's   Naproxen Hives    Social History:   Social History   Socioeconomic History   Marital status: Married    Spouse name: [name]   Number of children: 5   Years of education: Not on file   Highest education level: Not on file  Occupational History   Not on file  Tobacco Use   Smoking status: Former    Packs/day: 0.50    Years: 35.00    Total pack years: 17.50    Types: Cigarettes    Quit date: 03/25/1978    Years since quitting: 43.9    Passive exposure: Never   Smokeless tobacco: Never   Tobacco comments:    quit more than 30 years ago  Vaping Use   Vaping Use: Never used  Substance and Sexual Activity   Alcohol use: No   Drug use: No   Sexual activity: Not on file  Other Topics Concern   Not on file  Social History Narrative   Not on file   Social Determinants of Health    Financial Resource Strain: Low Risk  (06/29/2019)   Overall Financial Resource Strain (CARDIA)    Difficulty of Paying Living Expenses: Not hard at all  Food Insecurity: No Food Insecurity (06/29/2019)   Hunger Vital Sign    Worried About Running Out of Food in the Last Year: Never true    Ran Out of Food in the Last Year: Never true  Transportation Needs: No Transportation Needs (06/29/2019)   PRAPARE - Hydrologist (Medical): No    Lack of Transportation (Non-Medical): No  Physical Activity: Inactive (06/29/2019)   Exercise Vital Sign    Days of Exercise per Week: 0 days    Minutes of Exercise per Session: 0 min  Stress: No Stress Concern Present (06/29/2019)   [place]    Feeling of Stress : Not at all  Social Connections: Moderately Integrated (06/29/2019)   Social Connection and Isolation Panel [NHANES]    Frequency of Communication with Friends and Family: More than three times a week    Frequency of Social Gatherings with Friends and Family: More than three times a week    Attends Religious Services: More than 4 times per year    Active Member of [employer] or Organizations: No    Attends Archivist Meetings: Never    Marital Status: Married  Human resources officer Violence: Not At Risk (06/29/2019)   Humiliation, Afraid, Rape, and Kick questionnaire    Fear of Current or Ex-Partner: No    Emotionally Abused: No    Physically Abused: No    Sexually Abused: No    Family History:     Family History  Problem Relation Age of Onset   Diabetes Father    Heart disease Father    Breast cancer  Mother    Congestive Heart Failure Brother    Anemia Daughter    Seizures Daughter    Migraines Daughter    Diabetes Daughter    Hypertension Daughter    Bladder Cancer Son    Congestive Heart Failure Son    Congestive Heart Failure Son      ROS:  Please see the history of present illness.   Review of Systems  Constitutional: Positive for decreased appetite.  HENT: Negative.    Eyes: Negative.   Cardiovascular:  Positive for dyspnea on exertion and leg swelling.  Respiratory:  Positive for shortness of breath.   Hematologic/Lymphatic: Negative.   Musculoskeletal: Negative.  Negative for joint pain.  Gastrointestinal:  Positive for melena.  Genitourinary:  Positive for bladder incontinence.  Neurological:  Positive for light-headedness and weakness.    All other ROS reviewed and negative.     Physical Exam/Data:   Vitals:   02/25/22 0530 02/25/22 0652 02/25/22 0730 02/25/22 0800  BP: 115/60 (!) 107/51 (!) 111/56 (!) 113/48  Pulse: (!) 53 71 (!) 46 (!) 44  Resp: (!) 26 (!) 21 (!) 27 (!) 24  Temp:      TempSrc:      SpO2: 100% 100% 98% 98%  Weight:      Height:       No intake or output data in the 24 hours ending 02/25/22 0832    02/24/2022   11:37 PM 01/11/2022    2:09 PM 11/27/2021   10:53 AM  Last 3 Weights  Weight (lbs) 175 lb 177 lb 9.6 oz 165 lb  Weight (kg) 79.379 kg 80.559 kg 74.844 kg     Body mass index is 29.12 kg/m.  General:  Well nourished, well developed, in no acute distress  HEENT: normal Neck: increased JVD Vascular: No carotid bruits; Distal pulses 2+ bilaterally Cardiac:  normal S1, S2; irreg with 2/6 systolic murmur LSB Lungs:  decreased breath sounds but clear to auscultation bilaterally, no wheezing, rhonchi or rales  Abd: soft, nontender, no hepatomegaly  Ext: trace edema Musculoskeletal:  No deformities, BUE and BLE strength normal and equal Skin: warm and dry  Neuro:  CNs 2-12 intact, no focal abnormalities noted Psych:  Normal affect   EKG:  The EKG was personally reviewed and demonstrates:  Atrial fibrillation 54 Telemetry:  Telemetry was personally reviewed and demonstrates:  Afib/flutter 44-50's some NSVT.  Relevant CV Studies:  06/2020 echo   IMPRESSIONS     1. Left ventricular ejection fraction, by estimation, is 60  to 65%. The  left ventricle has normal function. The left ventricle demonstrates  regional wall motion abnormalities (see scoring diagram/findings for  description). Left ventricular diastolic  parameters are consistent with Grade II diastolic dysfunction  (pseudonormalization).   2. Right ventricular systolic function is mildly reduced. The right  ventricular size is normal. There is severely elevated pulmonary artery  systolic pressure. The estimated right ventricular systolic pressure is  44.0 mmHg.   3. The mitral valve is abnormal. Moderate mitral valve regurgitation.   4. Tricuspid valve regurgitation is moderate.   5. The aortic valve is tricuspid. There is mild calcification of the  aortic valve. Aortic valve regurgitation is not visualized.   6. The inferior vena cava is normal in size with <50% respiratory  variability, suggesting right atrial pressure of 8 mmHg.       Laboratory Data:  High Sensitivity Troponin:   Recent Labs  Lab 02/25/22 0238 02/25/22 0637  TROPONINIHS 10  9     Chemistry Recent Labs  Lab 02/25/22 0004 02/25/22 0238 02/25/22 0637  NA 140  --  138  K 5.7* 5.8* 5.7*  CL 99  --  100  CO2 31  --  30  GLUCOSE 188*  --  152*  BUN 34*  --  37*  CREATININE 2.09*  --  1.92*  CALCIUM 8.9  --  8.5*  GFRNONAA 24*  --  26*  ANIONGAP 10  --  8    No results for input(s): "PROT", "ALBUMIN", "AST", "ALT", "ALKPHOS", "BILITOT" in the last 168 hours. Lipids No results for input(s): "CHOL", "TRIG", "HDL", "LABVLDL", "LDLCALC", "CHOLHDL" in the last 168 hours.  Hematology Recent Labs  Lab 02/25/22 0004  WBC 6.5  RBC 2.59*  HGB 7.4*  HCT 25.4*  MCV 98.1  MCH 28.6  MCHC 29.1*  RDW 17.9*  PLT 255   Thyroid No results for input(s): "TSH", "FREET4" in the last 168 hours.  BNP Recent Labs  Lab 02/25/22 0238  BNP 459.0*    DDimer No results for input(s): "DDIMER" in the last 168 hours.   Radiology/Studies:  DG Chest Port 1 View  Result Date:  02/25/2022 CLINICAL DATA:  Syncope. EXAM: PORTABLE CHEST 1 VIEW COMPARISON:  October 21, 2021 FINDINGS: Multiple sternal wires and vascular clips are noted. The cardiac silhouette is mildly enlarged and unchanged in size. There is stable mild to moderate severity elevation of the right hemidiaphragm. Chronic appearing increased lung markings are seen without evidence of an acute infiltrate, pleural effusion or pneumothorax. Multilevel degenerative changes are seen throughout the thoracic spine. IMPRESSION: 1. Evidence of prior median sternotomy/CABG. 2. Chronic appearing increased lung markings without acute cardiopulmonary disease. Electronically Signed   By: Virgina Norfolk M.D.   On: 02/25/2022 02:40     Assessment and Plan:   Syncope in the setting of GI bleed Hbg 7.4 suspect vasovagal   Atrial fibrillation with slow VR new diagnosis for patient but looking back at EKG's she may have had Afib 10/21/21. On Coreg 25 mg bid. With slow HR's will decrease 12.5 mg bid  CAD S/P CABG 2019 (left internal mammary artery to left anterior descending, sequential saphenous vein graft to ramus intermedius branches 1 and 2). No angina -  10/2018 echo LVEF 60-65%  Acute on Chronic diastolic CHF BNP [id number] give lasix between transfusions. BP borderline and meds on hold.   HTN-was on amlodipine 10 mg daily, coreg 25 mg bid, lasix 40 mg am 20 pm, lisinopril 40 mg daily all on hold with low BP's-will monitor and restart as tolerated.  History of stage 1a nonsmall cell lung CA prior RUL lobectomy   Pulm HTN  History of PE  Carotid stenosis 05/2021 carotid US RICA 7-84%, LICA 69-62%     Risk Assessment/Risk Scores:        New York Heart Association (NYHA) Functional Class NYHA Class III  CHA2DS2-VASc Score = 6   This indicates a 9.7% annual risk of stroke. The patient's score is based upon: CHF History: 1 HTN History: 1 Diabetes History: 0 Stroke History: 0 Vascular Disease History: 1 Age Score:  2 Gender Score: 1         For questions or updates, please contact [place] Please consult [email or web address] for contact info under    Signed, Ermalinda Barrios, PA-C  02/25/2022 8:32 AM  Attending note Patient seen and discussed with PA Bonnell Public, I agree with her documentation. 76 yo female history of CAD  with prior CABG in 2019 left internal mammary artery to left anterior descending, sequential saphenous vein graft to ramus intermedius branches 1 and 2 in setting of NSTEMI, carotid stenosis, hyperlipidemia, HTN, mod MR, chronic HFpEF, pulm HTN, lung cancer with prior RUL lobectomy, prior PE, COPD on home O2 admitted with syncope. In ER found to be anemic Hgb 7.4, hemoccult +. Cardiology consulted for new onset afib this admission   K 5.7 Cr 1.09 Hgb 7.4 (down from 10.7 3 months ago) BNP 459 TSH 1.7 CXR no acute process Carotid US: LICA >37%, RICA <85% EKG afib rate mid 50s Echo pending  06/2020 echo: LVEF 60-65%, grade II dd, mild RV dysfunction, PASP 61  1.Afib -new diagnosis this admission - rates mid 50s on initially EKGs, she was on coreg 25mg  bid at home -not anticoag candidate at this time due to GI bleed  -continue to hold coreg, likely start back lower dose pending bp's and HRs   2. Anemia - Hgb 7.4 down from 10.7 3 months ago - hemoccult + in ER   3.AKI - Cr 2.09 on admission, baseline 1.1-1.3   4. HTN - soft bp's, bp meds on hold  5.HFpEF - 06/2020 echo: LVEF 60-65%, grade II dd, mild RV dysfunction, PASP 61 - CXR no acute process, BNP 459. Reds vest pending - unclear volume status, she denies significant symptoms. With soft bp's and signs of GI bleed would hold on any diuresis. Check orthostatics, f/u reds vest data  6.Syncope - in setting of GI bleed, Hgb down to 7.4. Unclear if afib with low VR played a role.  - f/u orthostatics  7. Carotid stenosis - known disease - advanced age, poor functional status with some dementia don't see her being  candidate for intervention. - conitnue medical therapy.   Carlyle Dolly MD

## 2022-02-25 NOTE — ED Notes (Signed)
ED Provider at bedside. 

## 2022-02-25 NOTE — Hospital Course (Signed)
76 year old female with a history of coronary artery disease status post CABG 2019, HFpEF, diabetes mellitus type 2, hyperlipidemia, postoperative atrial fibrillation, non-small cell lung cancer status post lobectomy 05/13/2019, COPD, chronic respiratory failure on 2 L and pulmonary hypertension presenting with a syncopal episode.  The patient spouse was helping the patient get back into the bedroom when she had a syncopal episode lasting about 2 minutes.  There is no postictal symptoms.  There is no tonic-clonic activity.  However, the patient states that she has had increasing shortness of breath and dyspnea on exertion for the past 2 months, worst in the past 2 to 3 weeks.  She denies any chest pain, coughing, hemoptysis.  She has not had any fevers, chills, nausea, vomiting, diarrhea, but she states that she has been having some melanotic stools. In the ED, the patient was afebrile and hemodynamically stable with oxygen saturation 100% on room 2 L.  WBC 6.5, hemoglobin 7.4, platelets 255,000.  FOBT was positive. Sodium 140, potassium 5.7, bicarbonate 31, serum creatinine 2.09.  Chest x-ray showed increased interstitial markings. Cardiology and GI were consulted to assist with management.

## 2022-02-26 ENCOUNTER — Encounter (HOSPITAL_COMMUNITY)
Admission: EM | Disposition: A | Discharge status: Home / Self Care | Payer: Self-pay | Source: Home / Self Care | Attending: Family Medicine

## 2022-02-26 ENCOUNTER — Inpatient Hospital Stay (HOSPITAL_COMMUNITY): Payer: PPO | Admitting: Anesthesiology

## 2022-02-26 DIAGNOSIS — K449 Diaphragmatic hernia without obstruction or gangrene: Secondary | ICD-10-CM | POA: Diagnosis not present

## 2022-02-26 DIAGNOSIS — I5033 Acute on chronic diastolic (congestive) heart failure: Secondary | ICD-10-CM | POA: Diagnosis not present

## 2022-02-26 DIAGNOSIS — F419 Anxiety disorder, unspecified: Secondary | ICD-10-CM | POA: Diagnosis not present

## 2022-02-26 DIAGNOSIS — I25119 Atherosclerotic heart disease of native coronary artery with unspecified angina pectoris: Secondary | ICD-10-CM

## 2022-02-26 DIAGNOSIS — I5032 Chronic diastolic (congestive) heart failure: Secondary | ICD-10-CM

## 2022-02-26 DIAGNOSIS — D124 Benign neoplasm of descending colon: Secondary | ICD-10-CM | POA: Diagnosis not present

## 2022-02-26 DIAGNOSIS — D509 Iron deficiency anemia, unspecified: Secondary | ICD-10-CM

## 2022-02-26 DIAGNOSIS — E1169 Type 2 diabetes mellitus with other specified complication: Secondary | ICD-10-CM | POA: Diagnosis not present

## 2022-02-26 DIAGNOSIS — I10 Essential (primary) hypertension: Secondary | ICD-10-CM | POA: Diagnosis not present

## 2022-02-26 DIAGNOSIS — Z85118 Personal history of other malignant neoplasm of bronchus and lung: Secondary | ICD-10-CM | POA: Diagnosis not present

## 2022-02-26 DIAGNOSIS — I4891 Unspecified atrial fibrillation: Secondary | ICD-10-CM | POA: Diagnosis present

## 2022-02-26 DIAGNOSIS — I6523 Occlusion and stenosis of bilateral carotid arteries: Secondary | ICD-10-CM | POA: Diagnosis present

## 2022-02-26 DIAGNOSIS — K921 Melena: Secondary | ICD-10-CM | POA: Diagnosis present

## 2022-02-26 DIAGNOSIS — J9611 Chronic respiratory failure with hypoxia: Secondary | ICD-10-CM | POA: Diagnosis present

## 2022-02-26 DIAGNOSIS — D62 Acute posthemorrhagic anemia: Secondary | ICD-10-CM | POA: Diagnosis present

## 2022-02-26 DIAGNOSIS — D649 Anemia, unspecified: Secondary | ICD-10-CM | POA: Diagnosis not present

## 2022-02-26 DIAGNOSIS — E875 Hyperkalemia: Secondary | ICD-10-CM | POA: Diagnosis present

## 2022-02-26 DIAGNOSIS — I110 Hypertensive heart disease with heart failure: Secondary | ICD-10-CM

## 2022-02-26 DIAGNOSIS — I071 Rheumatic tricuspid insufficiency: Secondary | ICD-10-CM | POA: Diagnosis present

## 2022-02-26 DIAGNOSIS — E1122 Type 2 diabetes mellitus with diabetic chronic kidney disease: Secondary | ICD-10-CM | POA: Diagnosis present

## 2022-02-26 DIAGNOSIS — Z87891 Personal history of nicotine dependence: Secondary | ICD-10-CM

## 2022-02-26 DIAGNOSIS — Z66 Do not resuscitate: Secondary | ICD-10-CM | POA: Diagnosis present

## 2022-02-26 DIAGNOSIS — Q438 Other specified congenital malformations of intestine: Secondary | ICD-10-CM | POA: Diagnosis not present

## 2022-02-26 DIAGNOSIS — E782 Mixed hyperlipidemia: Secondary | ICD-10-CM | POA: Diagnosis present

## 2022-02-26 DIAGNOSIS — N179 Acute kidney failure, unspecified: Secondary | ICD-10-CM | POA: Diagnosis present

## 2022-02-26 DIAGNOSIS — Z902 Acquired absence of lung [part of]: Secondary | ICD-10-CM | POA: Diagnosis not present

## 2022-02-26 DIAGNOSIS — Z9981 Dependence on supplemental oxygen: Secondary | ICD-10-CM | POA: Diagnosis not present

## 2022-02-26 DIAGNOSIS — E1165 Type 2 diabetes mellitus with hyperglycemia: Secondary | ICD-10-CM | POA: Diagnosis present

## 2022-02-26 DIAGNOSIS — N1832 Chronic kidney disease, stage 3b: Secondary | ICD-10-CM | POA: Diagnosis present

## 2022-02-26 DIAGNOSIS — I272 Pulmonary hypertension, unspecified: Secondary | ICD-10-CM | POA: Diagnosis present

## 2022-02-26 DIAGNOSIS — I130 Hypertensive heart and chronic kidney disease with heart failure and stage 1 through stage 4 chronic kidney disease, or unspecified chronic kidney disease: Secondary | ICD-10-CM | POA: Diagnosis present

## 2022-02-26 DIAGNOSIS — F1320 Sedative, hypnotic or anxiolytic dependence, uncomplicated: Secondary | ICD-10-CM | POA: Diagnosis present

## 2022-02-26 DIAGNOSIS — F0394 Unspecified dementia, unspecified severity, with anxiety: Secondary | ICD-10-CM | POA: Diagnosis present

## 2022-02-26 DIAGNOSIS — R55 Syncope and collapse: Secondary | ICD-10-CM | POA: Diagnosis present

## 2022-02-26 HISTORY — PX: ESOPHAGOGASTRODUODENOSCOPY (EGD) WITH PROPOFOL: SHX5813

## 2022-02-26 LAB — URINALYSIS, COMPLETE (UACMP) WITH MICROSCOPIC
Bacteria, UA: NONE SEEN
Bilirubin Urine: NEGATIVE
Glucose, UA: NEGATIVE mg/dL
Hgb urine dipstick: NEGATIVE
Ketones, ur: NEGATIVE mg/dL
Nitrite: NEGATIVE
Protein, ur: 30 mg/dL — AB
Specific Gravity, Urine: 1.013 (ref 1.005–1.030)
pH: 5 (ref 5.0–8.0)

## 2022-02-26 LAB — CBC
HCT: 22.3 % — ABNORMAL LOW (ref 36.0–46.0)
Hemoglobin: 6.7 g/dL — CL (ref 12.0–15.0)
MCH: 29.3 pg (ref 26.0–34.0)
MCHC: 30 g/dL (ref 30.0–36.0)
MCV: 97.4 fL (ref 80.0–100.0)
Platelets: 217 10*3/uL (ref 150–400)
RBC: 2.29 MIL/uL — ABNORMAL LOW (ref 3.87–5.11)
RDW: 18.2 % — ABNORMAL HIGH (ref 11.5–15.5)
WBC: 4.2 10*3/uL (ref 4.0–10.5)
nRBC: 0 % (ref 0.0–0.2)

## 2022-02-26 LAB — COMPREHENSIVE METABOLIC PANEL
ALT: 11 U/L (ref 0–44)
AST: 15 U/L (ref 15–41)
Albumin: 2.7 g/dL — ABNORMAL LOW (ref 3.5–5.0)
Alkaline Phosphatase: 47 U/L (ref 38–126)
Anion gap: 8 (ref 5–15)
BUN: 32 mg/dL — ABNORMAL HIGH (ref 8–23)
CO2: 31 mmol/L (ref 22–32)
Calcium: 8.5 mg/dL — ABNORMAL LOW (ref 8.9–10.3)
Chloride: 100 mmol/L (ref 98–111)
Creatinine, Ser: 1.83 mg/dL — ABNORMAL HIGH (ref 0.44–1.00)
GFR, Estimated: 28 mL/min — ABNORMAL LOW (ref >60)
Glucose, Bld: 83 mg/dL (ref 70–99)
Potassium: 4.8 mmol/L (ref 3.5–5.1)
Sodium: 139 mmol/L (ref 135–145)
Total Bilirubin: 0.9 mg/dL (ref 0.3–1.2)
Total Protein: 6.3 g/dL — ABNORMAL LOW (ref 6.5–8.1)

## 2022-02-26 LAB — POTASSIUM
Potassium: 4.8 mmol/L (ref 3.5–5.1)
Potassium: 4.7 mmol/L (ref 3.5–5.1)
Potassium: 4.9 mmol/L (ref 3.5–5.1)
Potassium: 4.4 mmol/L (ref 3.5–5.1)
Potassium: 4.5 mmol/L (ref 3.5–5.1)

## 2022-02-26 LAB — MAGNESIUM: Magnesium: 2.2 mg/dL (ref 1.7–2.4)

## 2022-02-26 LAB — PHOSPHORUS: Phosphorus: 4.5 mg/dL (ref 2.5–4.6)

## 2022-02-26 LAB — PREPARE RBC (CROSSMATCH)

## 2022-02-26 LAB — GLUCOSE, CAPILLARY
Glucose-Capillary: 90 mg/dL (ref 70–99)
Glucose-Capillary: 92 mg/dL (ref 70–99)
Glucose-Capillary: 77 mg/dL (ref 70–99)
Glucose-Capillary: 166 mg/dL — ABNORMAL HIGH (ref 70–99)
Glucose-Capillary: 108 mg/dL — ABNORMAL HIGH (ref 70–99)
Glucose-Capillary: 124 mg/dL — ABNORMAL HIGH (ref 70–99)

## 2022-02-26 LAB — HEMOGLOBIN A1C
Hgb A1c MFr Bld: 6.8 % — ABNORMAL HIGH (ref 4.8–5.6)
Mean Plasma Glucose: 148 mg/dL

## 2022-02-26 LAB — HEMOGLOBIN AND HEMATOCRIT, BLOOD
HCT: 22 % — ABNORMAL LOW (ref 36.0–46.0)
Hemoglobin: 6.7 g/dL — CL (ref 12.0–15.0)

## 2022-02-26 SURGERY — ESOPHAGOGASTRODUODENOSCOPY (EGD) WITH PROPOFOL
Anesthesia: General

## 2022-02-26 MED ORDER — SODIUM CHLORIDE 0.9 % IV SOLN: INTRAVENOUS | Status: DC

## 2022-02-26 MED ORDER — DEXTROSE 50 % IV SOLN
INTRAVENOUS | Status: AC
  Filled 2022-02-26: qty 50

## 2022-02-26 MED ORDER — PEG 3350-KCL-NA BICARB-NACL 420 G PO SOLR
4000.0000 mL | Freq: Once | ORAL | Status: AC
  Administered 2022-02-26: 4000 mL via ORAL

## 2022-02-26 MED ORDER — ETOMIDATE 2 MG/ML IV SOLN
INTRAVENOUS | Status: DC | PRN
  Administered 2022-02-26: 2 mg via INTRAVENOUS

## 2022-02-26 MED ORDER — LACTATED RINGERS IV SOLN: INTRAVENOUS | Status: DC | PRN

## 2022-02-26 MED ORDER — IPRATROPIUM-ALBUTEROL 0.5-2.5 (3) MG/3ML IN SOLN
RESPIRATORY_TRACT | Status: AC
  Filled 2022-02-26: qty 3

## 2022-02-26 MED ORDER — PROPOFOL 10 MG/ML IV BOLUS
INTRAVENOUS | Status: DC | PRN
  Administered 2022-02-26: 30 mg via INTRAVENOUS

## 2022-02-26 MED ORDER — LIDOCAINE VISCOUS HCL 2 % MT SOLN: 15.0000 mL | Freq: Once | OROMUCOSAL | Status: DC

## 2022-02-26 MED ORDER — SODIUM CHLORIDE 0.9% IV SOLUTION: Freq: Once | INTRAVENOUS | Status: DC

## 2022-02-26 MED ORDER — LIDOCAINE VISCOUS HCL 2 % MT SOLN
OROMUCOSAL | Status: AC
  Filled 2022-02-26: qty 15

## 2022-02-26 MED ORDER — DEXTROSE 50 % IV SOLN
50.0000 mL | Freq: Once | INTRAVENOUS | Status: AC
  Administered 2022-02-26: 50 mL via INTRAVENOUS

## 2022-02-26 MED ORDER — LIDOCAINE HCL (CARDIAC) PF 100 MG/5ML IV SOSY
PREFILLED_SYRINGE | INTRAVENOUS | Status: DC | PRN
  Administered 2022-02-26: 50 mg via INTRAVENOUS

## 2022-02-26 MED ORDER — LACTATED RINGERS IV SOLN: INTRAVENOUS | Status: DC

## 2022-02-26 MED ORDER — IPRATROPIUM-ALBUTEROL 0.5-2.5 (3) MG/3ML IN SOLN
3.0000 mL | Freq: Once | RESPIRATORY_TRACT | Status: AC
  Administered 2022-02-26: 3 mL via RESPIRATORY_TRACT

## 2022-02-26 NOTE — Plan of Care (Signed)
  Problem: Coping: Goal: Ability to adjust to condition or change in health will improve Outcome: Progressing   

## 2022-02-26 NOTE — Progress Notes (Signed)
Rounding Note    Patient Name: Karen Tate Date of Encounter: 02/26/2022  [facility] Cardiologist: Carlyle Dolly, MD   Subjective   No complaints  Inpatient Medications    Scheduled Meds:  sodium chloride   Intravenous Once   ALPRAZolam  0.5 mg Oral BID   aspirin EC  81 mg Oral Daily   atorvastatin  80 mg Oral q1800   insulin aspart  0-5 Units Subcutaneous QHS   insulin aspart  0-9 Units Subcutaneous TID WC   mometasone-formoterol  2 puff Inhalation BID   pantoprazole (PROTONIX) IV  40 mg Intravenous Q12H   Continuous Infusions:  PRN Meds: acetaminophen **OR** acetaminophen, albuterol, prochlorperazine   Vital Signs    Vitals:   02/25/22 2026 02/26/22 0011 02/26/22 0448 02/26/22 0751  BP: (!) 117/52 (!) 100/47 (!) 96/46   Pulse: 63 60 (!) 59 60  Resp: 20 14 20 16   Temp: 98.2 F (36.8 C) 98.3 F (36.8 C) 97.6 F (36.4 C)   TempSrc: Oral     SpO2: 100% 99% 100% 98%  Weight:      Height:       No intake or output data in the 24 hours ending 02/26/22 0850    02/24/2022   11:37 PM 01/11/2022    2:09 PM 11/27/2021   10:53 AM  Last 3 Weights  Weight (lbs) 175 lb 177 lb 9.6 oz 165 lb  Weight (kg) 79.379 kg 80.559 kg 74.844 kg      Telemetry    Afib 40s-60s - Personally Reviewed  ECG    N/a - Personally Reviewed  Physical Exam   GEN: No acute distress.   Neck: +JVD Cardiac: irreg, no m/r/g Respiratory: Clear to auscultation bilaterally. GI: Soft, nontender, non-distended  MS: No edema; No deformity. Neuro:  Nonfocal  Psych: Normal affect   Labs    High Sensitivity Troponin:   Recent Labs  Lab 02/25/22 0238 02/25/22 0637 02/25/22 0856  TROPONINIHS 10 9 10      Chemistry Recent Labs  Lab 02/25/22 0004 02/25/22 0238 02/25/22 0637 02/25/22 0943 02/25/22 2252 02/26/22 0255 02/26/22 0614  NA 140  --  138  --   --  139  --   K 5.7*   < > 5.7*   < > 4.7 4.8 4.8  CL 99  --  100  --   --  100  --   CO2 31  --  30  --   --   31  --   GLUCOSE 188*  --  152*  --   --  83  --   BUN 34*  --  37*  --   --  32*  --   CREATININE 2.09*  --  1.92*  --   --  1.83*  --   CALCIUM 8.9  --  8.5*  --   --  8.5*  --   MG  --   --   --   --   --  2.2  --   PROT  --   --   --   --   --  6.3*  --   ALBUMIN  --   --   --   --   --  2.7*  --   AST  --   --   --   --   --  15  --   ALT  --   --   --   --   --  11  --   ALKPHOS  --   --   --   --   --  47  --   BILITOT  --   --   --   --   --  0.9  --   GFRNONAA 24*  --  26*  --   --  28*  --   ANIONGAP 10  --  8  --   --  8  --    < > = values in this interval not displayed.    Lipids No results for input(s): "CHOL", "TRIG", "HDL", "LABVLDL", "LDLCALC", "CHOLHDL" in the last 168 hours.  Hematology Recent Labs  Lab 02/25/22 0004 02/26/22 0255 02/26/22 0614  WBC 6.5 4.2  --   RBC 2.59* 2.29*  --   HGB 7.4* 6.7* 6.7*  HCT 25.4* 22.3* 22.0*  MCV 98.1 97.4  --   MCH 28.6 29.3  --   MCHC 29.1* 30.0  --   RDW 17.9* 18.2*  --   PLT 255 217  --    Thyroid  Recent Labs  Lab 02/25/22 0856  TSH 1.729  FREET4 0.86    BNP Recent Labs  Lab 02/25/22 0238  BNP 459.0*    DDimer No results for input(s): "DDIMER" in the last 168 hours.   Radiology    EEG adult  Result Date: 02/25/2022 Spero Curb, MD     02/25/2022  9:29 PM [facility] Routine EEG Report Video Performed: Performed Demographics: Patient Name:   Karen Tate, Karen Tate Date of Birth:   02/16/1946 Identification Number:   MRN - [id number] Study Times: Study Start Time:   02/25/2022 13:33:00 Study End Time:   02/25/2022 14:16:00 Indication(s): Syncope Medication: Xanax Technical Summary: This EEG was performed utilizing standard International 10-20 System of electrode placement. One channel electrocardiogram was monitored. Data were obtained, stored, and interpreted utilizing referential montage recording, with reformatting to longitudinal, transverse bipolar, and  referential montages as necessary for interpretation. State(s):       Awake      Drowsy Activation Procedures: Hyperventilation: Not performed Photic Stimulation: Not performed EEG Description: During wakefulness, the background showed a continuous 8 Hz posterior dominant alpha rhythm which is fairly modulated, symmetrical and reactive to eye opening.  Drowsiness demonstrated a background attenuation. Sleep stage II was not reached.  There was no clinical event noted.  Throughout the record, there was no epileptiform abnormality or lateralizing sign observed.  Intermittent myogenic and movement artifacts were noted.   Impression: This is a normal awake and drowsy EEG. No epileptiform abnormality or lateralizing sign is observed.  Note that an absence of epileptiform abnormality does not exclude nor support the diagnosis of epilepsy. Dr Spero Curb [facility] For Inpatient follow-up with [facility] physician please call [facility] [phone]. This is not an outpatient service. Post hospital discharge, please contact hospital directly.   ECHOCARDIOGRAM COMPLETE  Result Date: 02/25/2022    ECHOCARDIOGRAM REPORT   Patient Name:   Karen Tate Date of Exam: 02/25/2022 Medical Rec #:  [id number]    Height:       65.0 in Accession #:    [id number]   Weight:       175.0 lb Date of Birth:  25-Jan-1946    BSA:          1.869 m Patient Age:    76 years     BP:           116/55 mmHg Patient Gender: F  HR:           57 bpm. Exam Location:  [facility] Procedure: 2D Echo, Cardiac Doppler and Color Doppler Indications:    Syncope  History:        Patient has prior history of Echocardiogram examinations, most                 recent 07/21/2020. CHF, Previous Myocardial Infarction and CAD,                 Prior CABG, Signs/Symptoms:Chest Pain and Syncope; Risk                 Factors:Hypertension, Diabetes and Dyslipidemia.  Sonographer:    Wenda Low Referring Phys: [id number] OLADAPO ADEFESO IMPRESSIONS  1.  Left ventricular ejection fraction, by estimation, is 65 to 70%. The left ventricle has normal function. The left ventricle has no regional wall motion abnormalities. Left ventricular diastolic parameters are indeterminate.  2. Ventricular septum is flattnened in systole and diastole suggesting RV pressure and volume overload. RV not well visualized, grossly appears enlarged with decreased systolic function. . Right ventricular systolic function was not well visualized. The  right ventricular size is not well visualized. There is severely elevated pulmonary artery systolic pressure.  3. Left atrial size was mildly dilated.  4. The mitral valve is abnormal. Mild mitral valve regurgitation. No evidence of mitral stenosis. Moderate mitral annular calcification.  5. Moderate to severe TR, hepatic systolic flow reversal would suggest severe TR. . Tricuspid valve regurgitation is moderate. 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  6. The aortic valve is tricuspid. There is moderate calcification of the aortic valve. There is moderate thickening of the aortic valve. Aortic valve regurgitation is not visualized. No aortic stenosis is present.  7. The pulmonic valve was abnormal.  8. The inferior vena cava is dilated in size with <50% respiratory variability, suggesting right atrial pressure of 15 mmHg. FINDINGS  Left Ventricle: Left ventricular ejection fraction, by estimation, is 65 to 70%. The left ventricle has normal function. The left ventricle has no regional wall motion abnormalities. The left ventricular internal cavity size was normal in size. There is  no left ventricular hypertrophy. Left ventricular diastolic parameters are indeterminate. Right Ventricle: Ventricular septum is flattnened in systole and diastole suggesting RV pressure and volume overload. RV not well visualized, grossly appears enlarged with decreased systolic function. The right ventricular size is not well visualized. Right vetricular wall  thickness was not well visualized. Right ventricular systolic function was not well visualized. There is severely elevated pulmonary artery systolic pressure. The tricuspid regurgitant velocity is 4.20 m/s, and with an assumed right  atrial pressure of 15 mmHg, the estimated right ventricular systolic pressure is 61.9 mmHg. Left Atrium: Left atrial size was mildly dilated. Right Atrium: Right atrial size was not well visualized. Pericardium: There is no evidence of pericardial effusion. Mitral Valve: The mitral valve is abnormal. There is moderate thickening of the mitral valve leaflet(s). There is moderate calcification of the mitral valve leaflet(s). Moderate mitral annular calcification. Mild mitral valve regurgitation. No evidence of mitral valve stenosis. MV peak gradient, 10.6 mmHg. The mean mitral valve gradient is 3.0 mmHg. Tricuspid Valve: Moderate to severe TR, hepatic systolic flow reversal would suggest severe TR. The tricuspid valve is not well visualized. Tricuspid valve regurgitation is moderate . 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 Aortic Valve: The aortic valve is tricuspid. There is moderate calcification of the aortic valve. There is moderate thickening of the aortic  valve. There is moderate aortic valve annular calcification. Aortic valve regurgitation is not visualized. No aortic stenosis is present. Aortic valve mean gradient measures 4.5 mmHg. Aortic valve peak gradient measures 8.8 mmHg. Aortic valve area, by VTI measures 2.74 cm. Pulmonic Valve: The pulmonic valve was abnormal. Pulmonic valve regurgitation is mild. No evidence of pulmonic stenosis. Aorta: The aortic root is normal in size and structure. Venous: The inferior vena cava is dilated in size with less than 50% respiratory variability, suggesting right atrial pressure of 15 mmHg. IAS/Shunts: No atrial level shunt detected by color flow Doppler.  LEFT VENTRICLE PLAX 2D LVIDd:         4.70 cm LVIDs:         2.80 cm LV PW:          1.00 cm LV IVS:        0.80 cm LVOT diam:     1.90 cm LV SV:         101 LV SV Index:   54 LVOT Area:     2.84 cm  RIGHT VENTRICLE RV Basal diam:  4.60 cm RV Mid diam:    3.90 cm LEFT ATRIUM             Index        RIGHT ATRIUM           Index LA diam:        4.40 cm 2.35 cm/m   RA Area:     20.00 cm LA Vol (A2C):   64.7 ml 34.62 ml/m  RA Volume:   60.40 ml  32.32 ml/m LA Vol (A4C):   71.7 ml 38.36 ml/m LA Biplane Vol: 73.4 ml 39.27 ml/m  AORTIC VALVE                    PULMONIC VALVE AV Area (Vmax):    2.62 cm     PV Vmax:       1.14 m/s AV Area (Vmean):   2.61 cm     PV Peak grad:  5.2 mmHg AV Area (VTI):     2.74 cm AV Vmax:           148.50 cm/s AV Vmean:          98.750 cm/s AV VTI:            0.370 m AV Peak Grad:      8.8 mmHg AV Mean Grad:      4.5 mmHg LVOT Vmax:         137.00 cm/s LVOT Vmean:        90.800 cm/s LVOT VTI:          0.357 m LVOT/AV VTI ratio: 0.96  AORTA Ao Root diam: 2.50 cm MITRAL VALVE                TRICUSPID VALVE MV Area (PHT): 4.12 cm     TR Peak grad:   70.6 mmHg MV Area VTI:   1.95 cm     TR Vmax:        420.00 cm/s MV Peak grad:  10.6 mmHg MV Mean grad:  3.0 mmHg     SHUNTS MV Vmax:       1.63 m/s     Systemic VTI:  0.36 m MV Vmean:      71.4 cm/s    Systemic Diam: 1.90 cm MV Decel Time: 184 msec MR Peak grad: 66.9 mmHg MR Mean grad: 39.0 mmHg MR  Vmax:      409.00 cm/s MR Vmean:     297.0 cm/s MV E velocity: 162.00 cm/s Carlyle Dolly MD Electronically signed by Carlyle Dolly MD Signature Date/Time: 02/25/2022/4:55:05 PM    Final    US Carotid Bilateral  Result Date: 02/25/2022 CLINICAL DATA:  Syncope Hypertension Hyperlipidemia Diabetes Remote tobacco use history EXAM: BILATERAL CAROTID DUPLEX ULTRASOUND TECHNIQUE: Pearline Cables scale imaging, color Doppler and duplex ultrasound were performed of bilateral carotid and vertebral arteries in the neck. COMPARISON:  None available FINDINGS: Criteria: Quantification of carotid stenosis is based on velocity parameters that  correlate the residual internal carotid diameter with NASCET-based stenosis levels, using the diameter of the distal internal carotid lumen as the denominator for stenosis measurement. The following velocity measurements were obtained: RIGHT ICA: 117/24 cm/sec CCA: 26/37 cm/sec SYSTOLIC ICA/CCA RATIO:  1.2 ECA: 241 cm/sec LEFT ICA: 244/46 cm/sec CCA: 85/88 cm/sec SYSTOLIC ICA/CCA RATIO:  3.0 ECA: 100 cm/sec RIGHT CAROTID ARTERY: Mild to moderate shadowing calcified plaque noted at the carotid bifurcation. RIGHT VERTEBRAL ARTERY:  Antegrade flow. LEFT CAROTID ARTERY: Moderate shadowing calcified plaque of the carotid bifurcation. LEFT VERTEBRAL ARTERY:  Antegrade flow. IMPRESSION: 1. 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 2. Less than 50% stenosis of the right internal carotid artery. Electronically Signed   By: Miachel Roux M.D.   On: 02/25/2022 09:31   DG Chest Port 1 View  Result Date: 02/25/2022 CLINICAL DATA:  Syncope. EXAM: PORTABLE CHEST 1 VIEW COMPARISON:  October 21, 2021 FINDINGS: Multiple sternal wires and vascular clips are noted. The cardiac silhouette is mildly enlarged and unchanged in size. There is stable mild to moderate severity elevation of the right hemidiaphragm. Chronic appearing increased lung markings are seen without evidence of an acute infiltrate, pleural effusion or pneumothorax. Multilevel degenerative changes are seen throughout the thoracic spine. IMPRESSION: 1. Evidence of prior median sternotomy/CABG. 2. Chronic appearing increased lung markings without acute cardiopulmonary disease. Electronically Signed   By: Virgina Norfolk M.D.   On: 02/25/2022 02:40    Cardiac Studies     Patient Profile     Karen Tate is a 76 y.o. female with a hx of CAD who is being seen 02/25/2022 for the evaluation of syncope and new Afib with slow VR in the setting of GI bleed at the  request of Dr. Carles Collet.   Assessment & Plan    1.Afib -new diagnosis this admission - rates mid 50s on initially EKGs, she was on coreg 25mg  bid at home -not anticoag candidate at this time due to GI bleed  -with low HRs 40s-60s coreg continued to be held     2. Anemia - Hgb 7.4 down from 10.7 3 months ago, this AM down to 6.7. Written for 2 units pRBCs per primaryt eam - hemoccult + in ER - seen by GI, from note plan for EGD     3.AKI - Cr 2.09 on admission, baseline 1.1-1.3 - down to 1.83 this AM     4. HTN - soft bp's, bp meds on hold   5.HFpEF - 06/2020 echo: LVEF 60-65%, grade II dd, mild RV dysfunction, PASP 61 - 02/2022 echo: LVE 65-70%, no WMAs, indet diastolic fxn, RV pressure and volume overload, decreased RV systolic function, severe pulm HTN, mod to severe TR, dilated IVC  - CXR no acute process, BNP 459. Reds vest 38% - elevated JVD but could be from her  mod to severe TR - likdly mildly fluid overloaded given data and findings, with soft bp's and declining Hgb and no cardiopulmonary symptoms would hold on diuresis. Follow bp's, respiratory status after transfusions today     6.Syncope - in setting of GI bleed, Hgb down to 6.7. Unclear if afib with low VR played a role.     7. Carotid stenosis - known disease - advanced age, poor functional status with some dementia don't see her being candidate for intervention. - conitnue medical therapy.   8. 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 Advanced age primarily sedentary with some progresing cognitive decline.  I think the possibility of vasodilators having a role is quite small, would not pursue further testing at this time   For questions or updates, please contact  [facility] Please consult [email or web address] for contact info under        Signed, Carlyle Dolly, MD  02/26/2022, 8:50 AM

## 2022-02-26 NOTE — Brief Op Note (Signed)
02/24/2022 - 02/26/2022  2:55 PM  PATIENT:  Karen Tate  76 y.o. female  PRE-OPERATIVE DIAGNOSIS:  acute blood loss anemia, heme positive stool  POST-OPERATIVE DIAGNOSIS:  3 cm hiatal hernia  PROCEDURE:  Procedure(s): ESOPHAGOGASTRODUODENOSCOPY (EGD) WITH PROPOFOL (N/A)  SURGEON:  Surgeon(s) and Role:    * Harvel Quale, MD - Primary  Patient underwent EGD under propofol sedation.  Tolerated the procedure adequately.  Findings: - 3 cm hiatal hernia.  - Gastroesophageal flap valve classified as Hill Grade III (minimal fold, loose to endoscope, hiatal hernia likely).  - Normal stomach.  - Normal examined duodenum.  - No specimens collected.   RECOMMENDATIONS - Return patient to hospital ward for ongoing care.  - Clear liquid diet.  - Will discuss possible colonoscopy tomorrow. - H/H every day.  Maylon Peppers, MD Gastroenterology and Hepatology [facility]

## 2022-02-26 NOTE — Transfer of Care (Signed)
Immediate Anesthesia Transfer of Care Note  Patient: Karen Tate  Procedure(s) Performed: ESOPHAGOGASTRODUODENOSCOPY (EGD) WITH PROPOFOL  Patient Location: PACU  Anesthesia Type:General  Level of Consciousness: awake  Airway & Oxygen Therapy: Patient Spontanous Breathing and Patient connected to nasal cannula oxygen  Post-op Assessment: Report given to RN and Post -op Vital signs reviewed and stable  Post vital signs: Reviewed and stable  Last Vitals:  Vitals Value Taken Time  BP 127/66 02/26/22 1454  Temp 36.7 C 02/26/22 1454  Pulse 60   Resp 30   SpO2 99 % 02/26/22 1454    Last Pain:  Vitals:   02/26/22 1441  TempSrc:   PainSc: 0-No pain      Patients Stated Pain Goal: 5 (63/01/60 1093)  Complications: No notable events documented.

## 2022-02-26 NOTE — Progress Notes (Signed)
Gastroenterology Progress Note   Primary Care Physician:  Neale Burly, MD Primary Gastroenterologist:  Dr. Abbey Chatters  Patient ID: Karen Tate; [id number]; 11-28-1945    Subjective   No overt GI bleeding, abdominal pain, N/V. Feels weak.    Objective   Vital signs in last 24 hours Temp:  [97.6 F (36.4 C)-98.3 F (36.8 C)] 97.6 F (36.4 C) (12/05 0448) Pulse Rate:  [51-63] 60 (12/05 0751) Resp:  [14-22] 16 (12/05 0751) BP: (96-117)/(46-87) 96/46 (12/05 0448) SpO2:  [98 %-100 %] 98 % (12/05 0751) Last BM Date : 02/25/22  Physical Exam General:   Alert and oriented, pleasant Abdomen:  Bowel sounds present, soft, non-tender, non-distended. No HSM or hernias noted. No rebound or guarding. No masses appreciated  Extremities:  Without  edema. Neurologic:  Alert and  oriented x4   Intake/Output from previous day: No intake/output data recorded. Intake/Output this shift: No intake/output data recorded.  Lab Results  Recent Labs    02/25/22 0004 02/26/22 0255 02/26/22 0614  WBC 6.5 4.2  --   HGB 7.4* 6.7* 6.7*  HCT 25.4* 22.3* 22.0*  PLT 255 217  --    BMET Recent Labs    02/25/22 0004 02/25/22 0238 02/25/22 0637 02/25/22 0943 02/25/22 2252 02/26/22 0255 02/26/22 0614  NA 140  --  138  --   --  139  --   K 5.7*   < > 5.7*   < > 4.7 4.8 4.8  CL 99  --  100  --   --  100  --   CO2 31  --  30  --   --  31  --   GLUCOSE 188*  --  152*  --   --  83  --   BUN 34*  --  37*  --   --  32*  --   CREATININE 2.09*  --  1.92*  --   --  1.83*  --   CALCIUM 8.9  --  8.5*  --   --  8.5*  --    < > = values in this interval not displayed.   LFT Recent Labs    02/26/22 0255  PROT 6.3*  ALBUMIN 2.7*  AST 15  ALT 11  ALKPHOS 47  BILITOT 0.9    Studies/Results EEG adult  Result Date: 02/25/2022 Spero Curb, MD     02/25/2022  9:29 PM [facility] Routine EEG Report Video Performed: Performed  Demographics: Patient Name:   Karen, Tate Date of Birth:   10/19/1945 Identification Number:   MRN - [id number] Study Times: Study Start Time:   02/25/2022 13:33:00 Study End Time:   02/25/2022 14:16:00 Indication(s): Syncope Medication: Xanax Technical Summary: This EEG was performed utilizing standard International 10-20 System of electrode placement. One channel electrocardiogram was monitored. Data were obtained, stored, and interpreted utilizing referential montage recording, with reformatting to longitudinal, transverse bipolar, and referential montages as necessary for interpretation. State(s):       Awake      Drowsy Activation Procedures: Hyperventilation: Not performed Photic Stimulation: Not performed EEG Description: During wakefulness, the background showed a continuous 8 Hz posterior dominant alpha rhythm which is fairly modulated, symmetrical and reactive to eye opening.  Drowsiness demonstrated a background attenuation. Sleep stage II was not reached.  There was no clinical event noted.  Throughout the record, there was no epileptiform abnormality or lateralizing sign observed.  Intermittent myogenic and movement artifacts were noted.  Impression: This is a normal awake and drowsy EEG. No epileptiform abnormality or lateralizing sign is observed.  Note that an absence of epileptiform abnormality does not exclude nor support the diagnosis of epilepsy. Dr Spero Curb [facility] For Inpatient follow-up with [facility] physician please call [facility] [phone]. This is not an outpatient service. Post hospital discharge, please contact hospital directly.   ECHOCARDIOGRAM COMPLETE  Result Date: 02/25/2022    ECHOCARDIOGRAM REPORT   Patient Name:   Karen Tate Date of Exam: 02/25/2022 Medical Rec #:  [id number]    Height:       65.0 in Accession #:    [id number]   Weight:       175.0 lb Date of Birth:  Apr 07, 1945    BSA:          1.869 m Patient Age:    76 years     BP:           116/55  mmHg Patient Gender: F            HR:           57 bpm. Exam Location:  [facility] Procedure: 2D Echo, Cardiac Doppler and Color Doppler Indications:    Syncope  History:        Patient has prior history of Echocardiogram examinations, most                 recent 07/21/2020. CHF, Previous Myocardial Infarction and CAD,                 Prior CABG, Signs/Symptoms:Chest Pain and Syncope; Risk                 Factors:Hypertension, Diabetes and Dyslipidemia.  Sonographer:    Wenda Low Referring Phys: [id number] OLADAPO ADEFESO IMPRESSIONS  1. Left ventricular ejection fraction, by estimation, is 65 to 70%. The left ventricle has normal function. The left ventricle has no regional wall motion abnormalities. Left ventricular diastolic parameters are indeterminate.  2. Ventricular septum is flattnened in systole and diastole suggesting RV pressure and volume overload. RV not well visualized, grossly appears enlarged with decreased systolic function. . Right ventricular systolic function was not well visualized. The  right ventricular size is not well visualized. There is severely elevated pulmonary artery systolic pressure.  3. Left atrial size was mildly dilated.  4. The mitral valve is abnormal. Mild mitral valve regurgitation. No evidence of mitral stenosis. Moderate mitral annular calcification.  5. Moderate to severe TR, hepatic systolic flow reversal would suggest severe TR. . Tricuspid valve regurgitation is moderate. 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  6. The aortic valve is tricuspid. There is moderate calcification of the aortic valve. There is moderate thickening of the aortic valve. Aortic valve regurgitation is not visualized. No aortic stenosis is present.  7. The pulmonic valve was abnormal.  8. The inferior vena cava is dilated in size with <50% respiratory variability, suggesting right atrial pressure of 15 mmHg. FINDINGS  Left Ventricle: Left ventricular ejection fraction, by estimation, is 65 to  70%. The left ventricle has normal function. The left ventricle has no regional wall motion abnormalities. The left ventricular internal cavity size was normal in size. There is  no left ventricular hypertrophy. Left ventricular diastolic parameters are indeterminate. Right Ventricle: Ventricular septum is flattnened in systole and diastole suggesting RV pressure and volume overload. RV not well visualized, grossly appears enlarged with decreased systolic function. The right ventricular size is not well visualized. Right vetricular wall  thickness was not well visualized. Right ventricular systolic function was not well visualized. There is severely elevated pulmonary artery systolic pressure. The tricuspid regurgitant velocity is 4.20 m/s, and with an assumed right  atrial pressure of 15 mmHg, the estimated right ventricular systolic pressure is 62.2 mmHg. Left Atrium: Left atrial size was mildly dilated. Right Atrium: Right atrial size was not well visualized. Pericardium: There is no evidence of pericardial effusion. Mitral Valve: The mitral valve is abnormal. There is moderate thickening of the mitral valve leaflet(s). There is moderate calcification of the mitral valve leaflet(s). Moderate mitral annular calcification. Mild mitral valve regurgitation. No evidence of mitral valve stenosis. MV peak gradient, 10.6 mmHg. The mean mitral valve gradient is 3.0 mmHg. Tricuspid Valve: Moderate to severe TR, hepatic systolic flow reversal would suggest severe TR. The tricuspid valve is not well visualized. Tricuspid valve regurgitation is moderate . 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 Aortic Valve: The aortic valve is tricuspid. There is moderate calcification of the aortic valve. There is moderate thickening of the aortic valve. There is moderate aortic valve annular calcification. Aortic valve regurgitation is not visualized. No aortic stenosis is present. Aortic valve mean gradient measures 4.5 mmHg. Aortic valve  peak gradient measures 8.8 mmHg. Aortic valve area, by VTI measures 2.74 cm. Pulmonic Valve: The pulmonic valve was abnormal. Pulmonic valve regurgitation is mild. No evidence of pulmonic stenosis. Aorta: The aortic root is normal in size and structure. Venous: The inferior vena cava is dilated in size with less than 50% respiratory variability, suggesting right atrial pressure of 15 mmHg. IAS/Shunts: No atrial level shunt detected by color flow Doppler.  LEFT VENTRICLE PLAX 2D LVIDd:         4.70 cm LVIDs:         2.80 cm LV PW:         1.00 cm LV IVS:        0.80 cm LVOT diam:     1.90 cm LV SV:         101 LV SV Index:   54 LVOT Area:     2.84 cm  RIGHT VENTRICLE RV Basal diam:  4.60 cm RV Mid diam:    3.90 cm LEFT ATRIUM             Index        RIGHT ATRIUM           Index LA diam:        4.40 cm 2.35 cm/m   RA Area:     20.00 cm LA Vol (A2C):   64.7 ml 34.62 ml/m  RA Volume:   60.40 ml  32.32 ml/m LA Vol (A4C):   71.7 ml 38.36 ml/m LA Biplane Vol: 73.4 ml 39.27 ml/m  AORTIC VALVE                    PULMONIC VALVE AV Area (Vmax):    2.62 cm     PV Vmax:       1.14 m/s AV Area (Vmean):   2.61 cm     PV Peak grad:  5.2 mmHg AV Area (VTI):     2.74 cm AV Vmax:           148.50 cm/s AV Vmean:          98.750 cm/s AV VTI:            0.370 m AV Peak Grad:      8.8 mmHg AV Mean Grad:  4.5 mmHg LVOT Vmax:         137.00 cm/s LVOT Vmean:        90.800 cm/s LVOT VTI:          0.357 m LVOT/AV VTI ratio: 0.96  AORTA Ao Root diam: 2.50 cm MITRAL VALVE                TRICUSPID VALVE MV Area (PHT): 4.12 cm     TR Peak grad:   70.6 mmHg MV Area VTI:   1.95 cm     TR Vmax:        420.00 cm/s MV Peak grad:  10.6 mmHg MV Mean grad:  3.0 mmHg     SHUNTS MV Vmax:       1.63 m/s     Systemic VTI:  0.36 m MV Vmean:      71.4 cm/s    Systemic Diam: 1.90 cm MV Decel Time: 184 msec MR Peak grad: 66.9 mmHg MR Mean grad: 39.0 mmHg MR Vmax:      409.00 cm/s MR Vmean:     297.0 cm/s MV E velocity: 162.00 cm/s Carlyle Dolly MD Electronically signed by Carlyle Dolly MD Signature Date/Time: 02/25/2022/4:55:05 PM    Final    US Carotid Bilateral  Result Date: 02/25/2022 CLINICAL DATA:  Syncope Hypertension Hyperlipidemia Diabetes Remote tobacco use history EXAM: BILATERAL CAROTID DUPLEX ULTRASOUND TECHNIQUE: Pearline Cables scale imaging, color Doppler and duplex ultrasound were performed of bilateral carotid and vertebral arteries in the neck. COMPARISON:  None available FINDINGS: Criteria: Quantification of carotid stenosis is based on velocity parameters that correlate the residual internal carotid diameter with NASCET-based stenosis levels, using the diameter of the distal internal carotid lumen as the denominator for stenosis measurement. The following velocity measurements were obtained: RIGHT ICA: 117/24 cm/sec CCA: 71/69 cm/sec SYSTOLIC ICA/CCA RATIO:  1.2 ECA: 241 cm/sec LEFT ICA: 244/46 cm/sec CCA: 67/89 cm/sec SYSTOLIC ICA/CCA RATIO:  3.0 ECA: 100 cm/sec RIGHT CAROTID ARTERY: Mild to moderate shadowing calcified plaque noted at the carotid bifurcation. RIGHT VERTEBRAL ARTERY:  Antegrade flow. LEFT CAROTID ARTERY: Moderate shadowing calcified plaque of the carotid bifurcation. LEFT VERTEBRAL ARTERY:  Antegrade flow. IMPRESSION: 1. 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 2. Less than 50% stenosis of the right internal carotid artery. Electronically Signed   By: Miachel Roux M.D.   On: 02/25/2022 09:31   DG Chest Port 1 View  Result Date: 02/25/2022 CLINICAL DATA:  Syncope. EXAM: PORTABLE CHEST 1 VIEW COMPARISON:  October 21, 2021 FINDINGS: Multiple sternal wires and vascular clips are noted. The cardiac silhouette is mildly enlarged and unchanged in size. There is stable mild to moderate severity elevation of the right hemidiaphragm. Chronic appearing increased lung markings are seen without evidence of an acute  infiltrate, pleural effusion or pneumothorax. Multilevel degenerative changes are seen throughout the thoracic spine. IMPRESSION: 1. Evidence of prior median sternotomy/CABG. 2. Chronic appearing increased lung markings without acute cardiopulmonary disease. Electronically Signed   By: Virgina Norfolk M.D.   On: 02/25/2022 02:40    Assessment  76 y.o. female with a history of CAD s/p CABG in 2019, heart failure, DM, hyperlipidemia, lung cancer in 2021 s/p right upper lobectomy, history of PE, COPD, pulmonary hypertension, presenting to the ED with syncopal episode. Presented to the ED with reports of SOB and DOE for several months, worsening over past few weeks. Admitted with acute blood loss anemia  with heme positive stool, acute on chronic heart failure, hyperkalemia, acute on chronic renal failure.   Acute blood loss anemia: Hgb 7.4 on admission, with last 10.7 in Aug 2023, baseline about 9/10 range. Heme positive. No melena since admission. Hgb this morning 6.7. 2 units PRBCs have been ordered. Needs diagnostic EGD. Discussing with Dr. Jenetta Downer timing. Will need to have 1 unit PRBCs started prior to undergoing procedure. Last colonoscopy/EGD in 2020 by Dr. Ladona Horns in [place] due to [place]. However, I don't have access to these reports, and patient is limited historian regarding this   Acute on chronic HF: Cardiology following. Discussed with Cardiology and appropriate for EGD.  Hyperkalemia: resolved.     Plan / Recommendations  Remain NPO Agree with transfusion Continue PPI BID Tentative EGD today; discussing with anesthesia.     LOS: 0 days    02/26/2022, 9:42 AM  Annitta Needs, PhD, [facility]

## 2022-02-26 NOTE — Anesthesia Postprocedure Evaluation (Signed)
Anesthesia Post Note  Patient: Karen Tate  Procedure(s) Performed: ESOPHAGOGASTRODUODENOSCOPY (EGD) WITH PROPOFOL  Patient location during evaluation: Phase II Anesthesia Type: General Level of consciousness: awake and alert and oriented Pain management: pain level controlled Vital Signs Assessment: post-procedure vital signs reviewed and stable Respiratory status: spontaneous breathing, nonlabored ventilation and respiratory function stable Cardiovascular status: blood pressure returned to baseline and stable Postop Assessment: no apparent nausea or vomiting Anesthetic complications: no  No notable events documented.   Last Vitals:  Vitals:   02/26/22 1515 02/26/22 1518  BP: (!) 133/46   Pulse: 62 62  Resp: (!) 27 (!) 25  Temp:  36.7 C  SpO2: 100% 100%    Last Pain:  Vitals:   02/26/22 1518  TempSrc:   PainSc: 0-No pain                 Jeni Duling C Lorilynn Lehr

## 2022-02-26 NOTE — Progress Notes (Signed)
PROGRESS NOTE  ROBERTO HLAVATY IRC:[id number] DOB: [birth year] DOA: 02/24/2022 PCP: Neale Burly, MD  Brief History:  76 year old female with a history of coronary artery disease status post CABG 2019, HFpEF, diabetes mellitus type 2, hyperlipidemia, postoperative atrial fibrillation, non-small cell lung cancer status post lobectomy 05/13/2019, COPD, chronic respiratory failure on 2 L and pulmonary hypertension presenting with a syncopal episode.  The patient spouse was helping the patient get back into the bedroom when she had a syncopal episode lasting about 2 minutes.  There is no postictal symptoms.  There is no tonic-clonic activity.  However, the patient states that she has had increasing shortness of breath and dyspnea on exertion for the past 2 months, worst in the past 2 to 3 weeks.  She denies any chest pain, coughing, hemoptysis.  She has not had any fevers, chills, nausea, vomiting, diarrhea, but she states that she has been having some melanotic stools. In the ED, the patient was afebrile and hemodynamically stable with oxygen saturation 100% on room 2 L.  WBC 6.5, hemoglobin 7.4, platelets 255,000.  FOBT was positive. Sodium 140, potassium 5.7, bicarbonate 31, serum creatinine 2.09.  Chest x-ray showed increased interstitial markings. Cardiology and GI were consulted to assist with management.   Assessment/Plan: Syncope -Likely secondary to blood loss anemia with drop in hemoglobin from 10.7 (11/07/21)>> 7.4 -EEG--neg for seizure -Cardiology consult appreciated -Remain on telemetry>>afib with SVR -07/21/2020 echo EF 60 to 65%,+WMX, grade 2 DD, RVSP 61.3, moderate TR/MR -02/25/22 Echo EF 65-70%, no WMA, RV overload, mod-severe TR  Melena/Acute Blood Loss Anemia -GI consult appreciated -Hgb 10.7 (11/07/21)>>7.4>>6.7 -2 units PRBC given -continue protonix bid -12/5 EGD--normal stomach/duodenum, 3 cm hiatus hernia -plan for colonoscopy 12/6 -continue pantoprazole IV bid    Acute on chronic diastolic CHF -The patient does have some signs of fluid overload -Cardiology following -holding off of furosemide temporarily -07/21/2020 echo EF 60 to 65%, +WMA, grade 2 DD, moderate TR, RSVP 61.3 -02/25/22 Echo EF 65-70%, no WMA, RV overload, mod-severe TR   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 -Baseline creatinine 1.0-1.3 -Patient presented with serum creatinine 2.09 -Monitor with diuresis -Hold lisinopril   Hyperkalemia -Presented with serum potassium 5.7 -Lokelma given -improved  Afib, type unspecified -SVR--HR 40-60 -holding coreg -not a candidate for AC due to GI bleed  Carotid stenosis -has had known stenosis -not a candidate for operative intervention given age, frailty, poor baseline function -R-ICA <50%, Left ICA 70%   Diabetes mellitus type 2 with hyperglycemia -11/12/2021 hemoglobin A1c 7.5 -NovoLog sliding scale -Holding metformin   Essential hypertension -Holding carvedilol secondary bradycardia -Hold lisinopril secondary to hyperkalemia and renal failure -Hold amlodipine secondary to soft blood pressure -BPs remains controlled   COPD -Stable without exacerbation -Continue Advair   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 Delton Coombes   Mixed Hyperlipidemia -continue statin   Pulmonary HTN -- Most likely secondary to her chronic O2 dependent lung disease, left sided heart diseas  -- no further testing at this time per cardiology given pt's frailty and co-morbidities             Family Communication:   spouse and daughter updated at bedside 12/5   Consultants:  GI, cardiology   Code Status:  FULL    DVT Prophylaxis:  SCDs     Procedures: As Listed in Progress Note Above   Antibiotics: None        Subjective:  Patient denies fevers, chills, headache, chest pain, dyspnea, nausea, vomiting, diarrhea, abdominal pain, dysuria, hematuria, hematochezia, and  melena.   Objective: Vitals:   02/26/22 1454 02/26/22 1500 02/26/22 1515 02/26/22 1518  BP: 127/66 (!) 123/100 (!) 133/46   Pulse:   62 62  Resp: (!) 30 18 (!) 27 (!) 25  Temp: 98 F (36.7 C)   98 F (36.7 C)  TempSrc:      SpO2: 99% 100% 100% 100%  Weight:      Height:        Intake/Output Summary (Last 24 hours) at 02/26/2022 1705 Last data filed at 02/26/2022 1456 Gross per 24 hour  Intake 562 ml  Output --  Net 562 ml   Weight change:  Exam:  General:  Pt is alert, follows commands appropriately, not in acute distress HEENT: No icterus, No thrush, No neck mass, [place]/AT Cardiovascular: IRRR, S1/S2, no rubs, no gallops Respiratory: bibasilar crackles. No wheeze Abdomen: Soft/+BS, non tender, non distended, no guarding Extremities: trace LE edema, No lymphangitis, No petechiae, No rashes, no synovitis   Data Reviewed: I have personally reviewed following labs and imaging studies Basic Metabolic Panel: Recent Labs  Lab 02/25/22 0004 02/25/22 0238 02/25/22 6759 02/25/22 0943 02/25/22 2252 02/26/22 0255 02/26/22 0614 02/26/22 1014 02/26/22 1337  NA 140  --  138  --   --  139  --   --   --   K 5.7*   < > 5.7*   < > 4.7 4.8 4.8 4.7 4.9  CL 99  --  100  --   --  100  --   --   --   CO2 31  --  30  --   --  31  --   --   --   GLUCOSE 188*  --  152*  --   --  83  --   --   --   BUN 34*  --  37*  --   --  32*  --   --   --   CREATININE 2.09*  --  1.92*  --   --  1.83*  --   --   --   CALCIUM 8.9  --  8.5*  --   --  8.5*  --   --   --   MG  --   --   --   --   --  2.2  --   --   --   PHOS  --   --   --   --   --  4.5  --   --   --    < > = values in this interval not displayed.   Liver Function Tests: Recent Labs  Lab 02/26/22 0255  AST 15  ALT 11  ALKPHOS 47  BILITOT 0.9  PROT 6.3*  ALBUMIN 2.7*   No results for input(s): "LIPASE", "AMYLASE" in the last 168 hours. No results for input(s): "AMMONIA" in the last 168 hours. Coagulation Profile: No results  for input(s): "INR", "PROTIME" in the last 168 hours. CBC: Recent Labs  Lab 02/25/22 0004 02/26/22 0255 02/26/22 0614  WBC 6.5 4.2  --   HGB 7.4* 6.7* 6.7*  HCT 25.4* 22.3* 22.0*  MCV 98.1 97.4  --   PLT 255 217  --    Cardiac Enzymes: No results for input(s): "CKTOTAL", "CKMB", "CKMBINDEX", "TROPONINI" in the last 168 hours. BNP: Invalid input(s): "POCBNP" CBG: Recent Labs  Lab 02/25/22  2037 02/26/22 0753 02/26/22 1147 02/26/22 1403 02/26/22 1436  GLUCAP 116* 90 92 77 166*   HbA1C: Recent Labs    03/11/2022 0637  HGBA1C 6.8*   Urine analysis:    Component Value Date/Time   COLORURINE YELLOW 02/26/2022 [place] 02/26/2022 0519   LABSPEC 1.013 02/26/2022 0519   PHURINE 5.0 02/26/2022 0519   GLUCOSEU NEGATIVE 02/26/2022 0519   HGBUR NEGATIVE 02/26/2022 0519   BILIRUBINUR NEGATIVE 02/26/2022 0519   [place] 02/26/2022 0519   PROTEINUR 30 (A) 02/26/2022 0519   NITRITE NEGATIVE 02/26/2022 0519   LEUKOCYTESUR TRACE (A) 02/26/2022 0519   Sepsis Labs: @LABRCNTIP (procalcitonin:4,lacticidven:4) )No results found for this or any previous visit (from the past 240 hour(s)).   Scheduled Meds:  sodium chloride   Intravenous Once   ALPRAZolam  0.5 mg Oral BID   aspirin EC  81 mg Oral Daily   atorvastatin  80 mg Oral q1800   dextrose       insulin aspart  0-5 Units Subcutaneous QHS   insulin aspart  0-9 Units Subcutaneous TID WC   mometasone-formoterol  2 puff Inhalation BID   pantoprazole (PROTONIX) IV  40 mg Intravenous Q12H   polyethylene glycol-electrolytes  4,000 mL Oral Once   Continuous Infusions:  Procedures/Studies: EEG adult  Result Date: 11-Mar-2022 Spero Curb, MD     03-11-22  9:29 PM [facility] Routine EEG Report Video Performed: Performed Demographics: Patient Name:   Cherita, Hebel Date of Birth:   20-Aug-1945 Identification Number:   MRN - [id number] Study Times: Study  Start Time:   2022/03/11 13:33:00 Study End Time:   2022/03/11 14:16:00 Indication(s): Syncope Medication: Xanax Technical Summary: This EEG was performed utilizing standard International 10-20 System of electrode placement. One channel electrocardiogram was monitored. Data were obtained, stored, and interpreted utilizing referential montage recording, with reformatting to longitudinal, transverse bipolar, and referential montages as necessary for interpretation. State(s):       Awake      Drowsy Activation Procedures: Hyperventilation: Not performed Photic Stimulation: Not performed EEG Description: During wakefulness, the background showed a continuous 8 Hz posterior dominant alpha rhythm which is fairly modulated, symmetrical and reactive to eye opening.  Drowsiness demonstrated a background attenuation. Sleep stage II was not reached.  There was no clinical event noted.  Throughout the record, there was no epileptiform abnormality or lateralizing sign observed.  Intermittent myogenic and movement artifacts were noted.   Impression: This is a normal awake and drowsy EEG. No epileptiform abnormality or lateralizing sign is observed.  Note that an absence of epileptiform abnormality does not exclude nor support the diagnosis of epilepsy. Dr Spero [facility] For Inpatient follow-up with [facility] physician please call [facility] [phone]. This is not an outpatient service. Post hospital discharge, please contact hospital directly.   ECHOCARDIOGRAM COMPLETE  Result Date: 03/11/22    ECHOCARDIOGRAM REPORT   Patient Name:   EULETA BELSON Date of Exam: Mar 11, 2022 Medical Rec #:  [id number]    Height:       65.0 in Accession #:    [id number]   Weight:       175.0 lb Date of Birth:  [birth year]    BSA:          1.869 m Patient Age:    42 years     BP:           116/55 mmHg Patient Gender: F  HR:           57 bpm. Exam Location:  [facility] Procedure: 2D Echo, Cardiac Doppler and Color  Doppler Indications:    Syncope  History:        Patient has prior history of Echocardiogram examinations, most                 recent 07/21/2020. CHF, Previous Myocardial Infarction and CAD,                 Prior CABG, Signs/Symptoms:Chest Pain and Syncope; Risk                 Factors:Hypertension, Diabetes and Dyslipidemia.  Sonographer:    Wenda Low Referring Phys: [id number] OLADAPO ADEFESO IMPRESSIONS  1. Left ventricular ejection fraction, by estimation, is 65 to 70%. The left ventricle has normal function. The left ventricle has no regional wall motion abnormalities. Left ventricular diastolic parameters are indeterminate.  2. Ventricular septum is flattnened in systole and diastole suggesting RV pressure and volume overload. RV not well visualized, grossly appears enlarged with decreased systolic function. . Right ventricular systolic function was not well visualized. The  right ventricular size is not well visualized. There is severely elevated pulmonary artery systolic pressure.  3. Left atrial size was mildly dilated.  4. The mitral valve is abnormal. Mild mitral valve regurgitation. No evidence of mitral stenosis. Moderate mitral annular calcification.  5. Moderate to severe TR, hepatic systolic flow reversal would suggest severe TR. . Tricuspid valve regurgitation is moderate. 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  6. The aortic valve is tricuspid. There is moderate calcification of the aortic valve. There is moderate thickening of the aortic valve. Aortic valve regurgitation is not visualized. No aortic stenosis is present.  7. The pulmonic valve was abnormal.  8. The inferior vena cava is dilated in size with <50% respiratory variability, suggesting right atrial pressure of 15 mmHg. FINDINGS  Left Ventricle: Left ventricular ejection fraction, by estimation, is 65 to 70%. The left ventricle has normal function. The left ventricle has no regional wall motion abnormalities. The left ventricular  internal cavity size was normal in size. There is  no left ventricular hypertrophy. Left ventricular diastolic parameters are indeterminate. Right Ventricle: Ventricular septum is flattnened in systole and diastole suggesting RV pressure and volume overload. RV not well visualized, grossly appears enlarged with decreased systolic function. The right ventricular size is not well visualized. Right vetricular wall thickness was not well visualized. Right ventricular systolic function was not well visualized. There is severely elevated pulmonary artery systolic pressure. The tricuspid regurgitant velocity is 4.20 m/s, and with an assumed right  atrial pressure of 15 mmHg, the estimated right ventricular systolic pressure is 44.3 mmHg. Left Atrium: Left atrial size was mildly dilated. Right Atrium: Right atrial size was not well visualized. Pericardium: There is no evidence of pericardial effusion. Mitral Valve: The mitral valve is abnormal. There is moderate thickening of the mitral valve leaflet(s). There is moderate calcification of the mitral valve leaflet(s). Moderate mitral annular calcification. Mild mitral valve regurgitation. No evidence of mitral valve stenosis. MV peak gradient, 10.6 mmHg. The mean mitral valve gradient is 3.0 mmHg. Tricuspid Valve: Moderate to severe TR, hepatic systolic flow reversal would suggest severe TR. The tricuspid valve is not well visualized. Tricuspid valve regurgitation is moderate . 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 Aortic Valve: The aortic valve is tricuspid. There is moderate calcification of the aortic valve. There is moderate thickening of the aortic  valve. There is moderate aortic valve annular calcification. Aortic valve regurgitation is not visualized. No aortic stenosis is present. Aortic valve mean gradient measures 4.5 mmHg. Aortic valve peak gradient measures 8.8 mmHg. Aortic valve area, by VTI measures 2.74 cm. Pulmonic Valve: The pulmonic valve was abnormal.  Pulmonic valve regurgitation is mild. No evidence of pulmonic stenosis. Aorta: The aortic root is normal in size and structure. Venous: The inferior vena cava is dilated in size with less than 50% respiratory variability, suggesting right atrial pressure of 15 mmHg. IAS/Shunts: No atrial level shunt detected by color flow Doppler.  LEFT VENTRICLE PLAX 2D LVIDd:         4.70 cm LVIDs:         2.80 cm LV PW:         1.00 cm LV IVS:        0.80 cm LVOT diam:     1.90 cm LV SV:         101 LV SV Index:   54 LVOT Area:     2.84 cm  RIGHT VENTRICLE RV Basal diam:  4.60 cm RV Mid diam:    3.90 cm LEFT ATRIUM             Index        RIGHT ATRIUM           Index LA diam:        4.40 cm 2.35 cm/m   RA Area:     20.00 cm LA Vol (A2C):   64.7 ml 34.62 ml/m  RA Volume:   60.40 ml  32.32 ml/m LA Vol (A4C):   71.7 ml 38.36 ml/m LA Biplane Vol: 73.4 ml 39.27 ml/m  AORTIC VALVE                    PULMONIC VALVE AV Area (Vmax):    2.62 cm     PV Vmax:       1.14 m/s AV Area (Vmean):   2.61 cm     PV Peak grad:  5.2 mmHg AV Area (VTI):     2.74 cm AV Vmax:           148.50 cm/s AV Vmean:          98.750 cm/s AV VTI:            0.370 m AV Peak Grad:      8.8 mmHg AV Mean Grad:      4.5 mmHg LVOT Vmax:         137.00 cm/s LVOT Vmean:        90.800 cm/s LVOT VTI:          0.357 m LVOT/AV VTI ratio: 0.96  AORTA Ao Root diam: 2.50 cm MITRAL VALVE                TRICUSPID VALVE MV Area (PHT): 4.12 cm     TR Peak grad:   70.6 mmHg MV Area VTI:   1.95 cm     TR Vmax:        420.00 cm/s MV Peak grad:  10.6 mmHg MV Mean grad:  3.0 mmHg     SHUNTS MV Vmax:       1.63 m/s     Systemic VTI:  0.36 m MV Vmean:      71.4 cm/s    Systemic Diam: 1.90 cm MV Decel Time: 184 msec MR Peak grad: 66.9 mmHg MR Mean grad: 39.0 mmHg MR  Vmax:      409.00 cm/s MR Vmean:     297.0 cm/s MV E velocity: 162.00 cm/s Carlyle Dolly MD Electronically signed by Carlyle Dolly MD Signature Date/Time: 02/25/2022/4:55:05 PM    Final    US Carotid  Bilateral  Result Date: 02/25/2022 CLINICAL DATA:  Syncope Hypertension Hyperlipidemia Diabetes Remote tobacco use history EXAM: BILATERAL CAROTID DUPLEX ULTRASOUND TECHNIQUE: Pearline Cables scale imaging, color Doppler and duplex ultrasound were performed of bilateral carotid and vertebral arteries in the neck. COMPARISON:  None available FINDINGS: Criteria: Quantification of carotid stenosis is based on velocity parameters that correlate the residual internal carotid diameter with NASCET-based stenosis levels, using the diameter of the distal internal carotid lumen as the denominator for stenosis measurement. The following velocity measurements were obtained: RIGHT ICA: 117/24 cm/sec CCA: 29/47 cm/sec SYSTOLIC ICA/CCA RATIO:  1.2 ECA: 241 cm/sec LEFT ICA: 244/46 cm/sec CCA: 65/46 cm/sec SYSTOLIC ICA/CCA RATIO:  3.0 ECA: 100 cm/sec RIGHT CAROTID ARTERY: Mild to moderate shadowing calcified plaque noted at the carotid bifurcation. RIGHT VERTEBRAL ARTERY:  Antegrade flow. LEFT CAROTID ARTERY: Moderate shadowing calcified plaque of the carotid bifurcation. LEFT VERTEBRAL ARTERY:  Antegrade flow. IMPRESSION: 1. 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 2. Less than 50% stenosis of the right internal carotid artery. Electronically Signed   By: Miachel Roux M.D.   On: 02/25/2022 09:31   DG Chest Port 1 View  Result Date: 02/25/2022 CLINICAL DATA:  Syncope. EXAM: PORTABLE CHEST 1 VIEW COMPARISON:  October 21, 2021 FINDINGS: Multiple sternal wires and vascular clips are noted. The cardiac silhouette is mildly enlarged and unchanged in size. There is stable mild to moderate severity elevation of the right hemidiaphragm. Chronic appearing increased lung markings are seen without evidence of an acute infiltrate, pleural effusion or pneumothorax. Multilevel degenerative changes are seen throughout the thoracic spine. IMPRESSION:  1. Evidence of prior median sternotomy/CABG. 2. Chronic appearing increased lung markings without acute cardiopulmonary disease. Electronically Signed   By: Virgina Norfolk M.D.   On: 02/25/2022 02:40    Orson Eva, DO  [facility]  If 7PM-7AM, please contact night-coverage [email or web address] Password [email or web address] 02/26/2022, 5:05 PM   LOS: 0 days

## 2022-02-26 NOTE — Progress Notes (Signed)
RN called due to patient's hemoglobin is 6.7, this was repeated and is still came up at 6.7.  2 units of PRBCs ordered to be transfused.

## 2022-02-26 NOTE — Anesthesia Preprocedure Evaluation (Addendum)
Anesthesia Evaluation  Patient identified by MRN, date of birth, ID band Patient awake    Reviewed: Allergy & Precautions, NPO status , Patient's Chart, lab work & pertinent test results, reviewed documented beta blocker date and time   History of Anesthesia Complications Negative for: history of anesthetic complications  Airway Mallampati: III  TM Distance: >3 FB Neck ROM: Full    Dental  (+) Dental Advisory Given, Missing   Pulmonary shortness of breath, with exertion and Long-Term Oxygen Therapy, asthma , former smoker  Shallow breathing  breath sounds clear to auscultation       Cardiovascular Exercise Tolerance: Poor hypertension, Pt. on medications and Pt. on home beta blockers + angina  + CAD, + Past MI, + CABG and +CHF  Normal cardiovascular exam Rhythm:Regular Rate:Normal  1. Left ventricular ejection fraction, by estimation, is 65 to 70%. The  left ventricle has normal function. The left ventricle has no regional  wall motion abnormalities. Left ventricular diastolic parameters are  indeterminate.   2. Ventricular septum is flattnened in systole and diastole suggesting RV  pressure and volume overload. RV not well visualized, grossly appears  enlarged with decreased systolic function. . Right ventricular systolic  function was not well visualized. The   right ventricular size is not well visualized. There is severely elevated  pulmonary artery systolic pressure.   3. Left atrial size was mildly dilated.   4. The mitral valve is abnormal. Mild mitral valve regurgitation. No  evidence of mitral stenosis. Moderate mitral annular calcification.   5. Moderate to severe TR, hepatic systolic flow reversal would suggest  severe TR. . Tricuspid valve regurgitation is moderate. 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   6. The aortic valve is tricuspid. There is moderate calcification of the  aortic valve. There is moderate  thickening of the aortic valve. Aortic  valve regurgitation is not visualized. No aortic stenosis is present.   7. The pulmonic valve was abnormal.   8. The inferior vena cava is dilated in size with <50% respiratory  variability, suggesting right atrial pressure of 15 mmHg.      Neuro/Psych   Anxiety      Neuromuscular disease    GI/Hepatic hiatal hernia,GERD  Medicated and Controlled,,  Endo/Other  diabetes, Well Controlled, Type 2, Oral Hypoglycemic Agents, Insulin Dependent    Renal/GU Renal disease     Musculoskeletal  (+) Arthritis ,    Abdominal   Peds  Hematology  (+) Blood dyscrasia, anemia   Anesthesia Other Findings   Reproductive/Obstetrics                              Anesthesia Physical Anesthesia Plan  ASA: 4 and emergent  Anesthesia Plan: General   Post-op Pain Management: Minimal or no pain anticipated   Induction: Intravenous  PONV Risk Score and Plan: Propofol infusion  Airway Management Planned: Nasal Cannula, Natural Airway and Simple Face Mask  Additional Equipment:   Intra-op Plan:   Post-operative Plan: Possible Post-op intubation/ventilation  Informed Consent: I have reviewed the patients History and Physical, chart, labs and discussed the procedure including the risks, benefits and alternatives for the proposed anesthesia with the patient or authorized representative who has indicated his/her understanding and acceptance.    Discussed DNR with power of attorney and Suspend DNR.   Dental advisory given and Consent reviewed with POA  Plan Discussed with: CRNA and Surgeon  Anesthesia Plan Comments:  Anesthesia Quick Evaluation

## 2022-02-26 NOTE — Op Note (Signed)
[facility] Patient Name: Karen Tate Procedure Date: 02/26/2022 2:34 PM MRN: [id number] Date of Birth: 10/26/1945 Attending MD: Maylon Peppers , , [id number] CSN: [id number] Age: 76 Admit Type: Outpatient Procedure:                Upper GI endoscopy Indications:              Iron deficiency anemia Providers:                Maylon Peppers, Lambert Mody, Thomas Hoff., Technician Referring MD:              Medicines:                Monitored Anesthesia Care Complications:            No immediate complications. Estimated Blood Loss:     Estimated blood loss: none. Procedure:                Pre-Anesthesia Assessment:                           - Prior to the procedure, a History and Physical                            was performed, and patient medications, allergies                            and sensitivities were reviewed. The patient's                            tolerance of previous anesthesia was reviewed.                           - The risks and benefits of the procedure and the                            sedation options and risks were discussed with the                            patient. All questions were answered and informed                            consent was obtained.                           - ASA Grade Assessment: III - A patient with severe                            systemic disease.                           After obtaining informed consent, the endoscope was                            passed under direct vision. Throughout the  procedure, the patient's blood pressure, pulse, and                            oxygen saturations were monitored continuously. The                            GIF-H190 ([id number]) scope was introduced through the                            mouth, and advanced to the second part of duodenum.                            The upper GI endoscopy was accomplished without                             difficulty. The patient tolerated the procedure                            well. Scope In: 2:47:40 PM Scope Out: 2:50:15 PM Total Procedure Duration: 0 hours 2 minutes 35 seconds  Findings:      A 3 cm hiatal hernia was present.      The gastroesophageal flap valve was visualized endoscopically and       classified as Hill Grade III (minimal fold, loose to endoscope, hiatal       hernia likely).      The stomach was normal.      The examined duodenum was normal. Impression:               - 3 cm hiatal hernia.                           - Gastroesophageal flap valve classified as Hill                            Grade III (minimal fold, loose to endoscope, hiatal                            hernia likely).                           - Normal stomach.                           - Normal examined duodenum.                           - No specimens collected. Moderate Sedation:      Per Anesthesia Care Recommendation:           - Return patient to hospital ward for ongoing care.                           - Clear liquid diet.                           - Will discuss possible colonoscopy tomorrow.                           -  H/H every day. Procedure Code(s):        --- Professional ---                           [phone], Esophagogastroduodenoscopy, flexible,                            transoral; diagnostic, including collection of                            specimen(s) by brushing or washing, when performed                            (separate procedure) Diagnosis Code(s):        --- Professional ---                           K44.9, Diaphragmatic hernia without obstruction or                            gangrene                           D50.9, Iron deficiency anemia, unspecified CPT copyright 2022 American Medical Association. All rights reserved. The codes documented in this report are preliminary and upon coder review may  be revised to meet current compliance  requirements. Maylon Peppers, MD Maylon Peppers,  02/26/2022 2:56:48 PM This report has been signed electronically. Number of Addenda: 0

## 2022-02-27 ENCOUNTER — Inpatient Hospital Stay (HOSPITAL_COMMUNITY): Payer: PPO | Admitting: Anesthesiology

## 2022-02-27 ENCOUNTER — Encounter (HOSPITAL_COMMUNITY): Payer: Self-pay | Admitting: Internal Medicine

## 2022-02-27 ENCOUNTER — Encounter (HOSPITAL_COMMUNITY)
Admission: EM | Disposition: A | Discharge status: Home / Self Care | Payer: Self-pay | Source: Home / Self Care | Attending: Family Medicine

## 2022-02-27 DIAGNOSIS — D649 Anemia, unspecified: Secondary | ICD-10-CM

## 2022-02-27 DIAGNOSIS — E875 Hyperkalemia: Secondary | ICD-10-CM | POA: Diagnosis not present

## 2022-02-27 DIAGNOSIS — D124 Benign neoplasm of descending colon: Secondary | ICD-10-CM

## 2022-02-27 DIAGNOSIS — E1169 Type 2 diabetes mellitus with other specified complication: Secondary | ICD-10-CM

## 2022-02-27 DIAGNOSIS — R55 Syncope and collapse: Secondary | ICD-10-CM | POA: Diagnosis not present

## 2022-02-27 DIAGNOSIS — D62 Acute posthemorrhagic anemia: Secondary | ICD-10-CM | POA: Diagnosis not present

## 2022-02-27 DIAGNOSIS — K921 Melena: Secondary | ICD-10-CM | POA: Diagnosis not present

## 2022-02-27 DIAGNOSIS — F419 Anxiety disorder, unspecified: Secondary | ICD-10-CM | POA: Diagnosis not present

## 2022-02-27 HISTORY — PX: COLONOSCOPY WITH PROPOFOL: SHX5780

## 2022-02-27 HISTORY — PX: POLYPECTOMY: SHX5525

## 2022-02-27 HISTORY — PX: GIVENS CAPSULE STUDY: SHX5432

## 2022-02-27 LAB — TYPE AND SCREEN
ABO/RH(D): A POS
Antibody Screen: NEGATIVE
Unit division: 0
Unit division: 0

## 2022-02-27 LAB — GLUCOSE, CAPILLARY
Glucose-Capillary: 95 mg/dL (ref 70–99)
Glucose-Capillary: 94 mg/dL (ref 70–99)
Glucose-Capillary: 103 mg/dL — ABNORMAL HIGH (ref 70–99)
Glucose-Capillary: 85 mg/dL (ref 70–99)
Glucose-Capillary: 85 mg/dL (ref 70–99)

## 2022-02-27 LAB — CBC
HCT: 30.5 % — ABNORMAL LOW (ref 36.0–46.0)
Hemoglobin: 9.2 g/dL — ABNORMAL LOW (ref 12.0–15.0)
MCH: 29.2 pg (ref 26.0–34.0)
MCHC: 30.2 g/dL (ref 30.0–36.0)
MCV: 96.8 fL (ref 80.0–100.0)
Platelets: 191 10*3/uL (ref 150–400)
RBC: 3.15 MIL/uL — ABNORMAL LOW (ref 3.87–5.11)
RDW: 17 % — ABNORMAL HIGH (ref 11.5–15.5)
WBC: 5.4 10*3/uL (ref 4.0–10.5)
nRBC: 0 % (ref 0.0–0.2)

## 2022-02-27 LAB — BASIC METABOLIC PANEL
Anion gap: 7 (ref 5–15)
BUN: 23 mg/dL (ref 8–23)
CO2: 29 mmol/L (ref 22–32)
Calcium: 8.5 mg/dL — ABNORMAL LOW (ref 8.9–10.3)
Chloride: 102 mmol/L (ref 98–111)
Creatinine, Ser: 1.41 mg/dL — ABNORMAL HIGH (ref 0.44–1.00)
GFR, Estimated: 38 mL/min — ABNORMAL LOW (ref >60)
Glucose, Bld: 85 mg/dL (ref 70–99)
Potassium: 4.4 mmol/L (ref 3.5–5.1)
Sodium: 138 mmol/L (ref 135–145)

## 2022-02-27 LAB — BPAM RBC
Blood Product Expiration Date: 202401072359
Blood Product Expiration Date: 202401072359
ISSUE DATE / TIME: 202312051005
ISSUE DATE / TIME: 202312051309
Unit Type and Rh: 6200
Unit Type and Rh: 6200

## 2022-02-27 LAB — URINE CULTURE: Culture: 20000 — AB

## 2022-02-27 LAB — POTASSIUM
Potassium: 4.5 mmol/L (ref 3.5–5.1)
Potassium: 4.5 mmol/L (ref 3.5–5.1)

## 2022-02-27 LAB — MAGNESIUM: Magnesium: 2 mg/dL (ref 1.7–2.4)

## 2022-02-27 SURGERY — IMAGING PROCEDURE, GI TRACT, INTRALUMINAL, VIA CAPSULE

## 2022-02-27 SURGERY — COLONOSCOPY WITH PROPOFOL
Anesthesia: General

## 2022-02-27 MED ORDER — PROPOFOL 10 MG/ML IV BOLUS
INTRAVENOUS | Status: DC | PRN
  Administered 2022-02-27 (×2): 20 mg via INTRAVENOUS

## 2022-02-27 MED ORDER — IPRATROPIUM-ALBUTEROL 0.5-2.5 (3) MG/3ML IN SOLN
3.0000 mL | Freq: Once | RESPIRATORY_TRACT | Status: AC
  Administered 2022-02-27: 3 mL via RESPIRATORY_TRACT

## 2022-02-27 MED ORDER — LIDOCAINE HCL (CARDIAC) PF 100 MG/5ML IV SOSY
PREFILLED_SYRINGE | INTRAVENOUS | Status: DC | PRN
  Administered 2022-02-27: 50 mg via INTRAVENOUS

## 2022-02-27 MED ORDER — LORAZEPAM 2 MG/ML IJ SOLN
0.5000 mg | INTRAMUSCULAR | Status: AC | PRN
  Administered 2022-02-27 – 2022-02-28 (×2): 0.5 mg via INTRAVENOUS
  Filled 2022-02-27 (×2): qty 1

## 2022-02-27 MED ORDER — PROPOFOL 500 MG/50ML IV EMUL
INTRAVENOUS | Status: DC | PRN
  Administered 2022-02-27: 100 ug/kg/min via INTRAVENOUS

## 2022-02-27 MED ORDER — ALPRAZOLAM 0.5 MG PO TABS: 0.5000 mg | ORAL_TABLET | Freq: Once | ORAL | Status: DC

## 2022-02-27 MED ORDER — IPRATROPIUM-ALBUTEROL 0.5-2.5 (3) MG/3ML IN SOLN
RESPIRATORY_TRACT | Status: AC
  Filled 2022-02-27: qty 3

## 2022-02-27 MED ORDER — IPRATROPIUM-ALBUTEROL 0.5-2.5 (3) MG/3ML IN SOLN: 3.0000 mL | RESPIRATORY_TRACT | Status: DC

## 2022-02-27 MED ORDER — SIMETHICONE 40 MG/0.6ML PO SUSP
ORAL | Status: AC
  Filled 2022-02-27: qty 0.6

## 2022-02-27 MED ORDER — LACTATED RINGERS IV SOLN: INTRAVENOUS | Status: DC

## 2022-02-27 NOTE — Interval H&P Note (Signed)
History and Physical Interval Note:  02/27/2022 11:38 AM  Karen Tate  has presented today for surgery, with the diagnosis of acute blood loss anemia, heme positive stool.  The various methods of treatment have been discussed with the patient and family. After consideration of risks, benefits and other options for treatment, the patient has consented to  Procedure(s): COLONOSCOPY WITH PROPOFOL (N/A) as a surgical intervention.  The patient's history has been reviewed, patient examined, no change in status, stable for surgery.  I have reviewed the patient's chart and labs.  Questions were answered to the patient's satisfaction.     Manus Rudd   patient seen in short stay.  Negative EGD yesterday.  Agree with need for colonoscopy to further evaluate etiology of anemia and Hemoccult positive stool.   Remains hemodynamically stable.  I will offer the patient  a diagnostic colonoscopy.The risks, benefits, limitations, alternatives and imponderables have been reviewed with the patient. Questions have been answered. All parties are agreeable.

## 2022-02-27 NOTE — Progress Notes (Signed)
   Progress Note  Patient Name: Karen Tate Date of Encounter: 02/27/2022  Primary Cardiologist: Carlyle Dolly, MD  Patient is not in the room when I went to see and examine her. She is in endoscopy suite. Cardiology team will follow her tomorrow.  Signed, Chalmers Guest, MD  02/27/2022, 1:30 PM

## 2022-02-27 NOTE — TOC Progression Note (Signed)
Transition of Care [facility]) - Progression Note    Patient Details  Name: Karen Tate MRN: [id number] Date of Birth: [birth year]  [facility]) CM/SW Contact  Salome Arnt, [place] Phone Number: 02/27/2022, 2:29 PM  Clinical Narrative:  Outpatient PT referral sent to [facility].      Expected Discharge Plan: OP Rehab Barriers to Discharge: Continued Medical Work up  Expected Discharge Plan and Services Expected Discharge Plan: OP Rehab       Living arrangements for the past 2 months: Single Family Home                                       Social Determinants of Health (SDOH) Interventions Housing Interventions: Intervention Not Indicated  Readmission Risk Interventions     No data to display

## 2022-02-27 NOTE — Anesthesia Preprocedure Evaluation (Addendum)
Anesthesia Evaluation  Patient identified by MRN, date of birth, ID band Patient awake    Reviewed: Allergy & Precautions, NPO status , Patient's Chart, lab work & pertinent test results, reviewed documented beta blocker date and time   History of Anesthesia Complications Negative for: history of anesthetic complications  Airway Mallampati: III  TM Distance: >3 FB Neck ROM: Full    Dental  (+) Dental Advisory Given, Missing   Pulmonary shortness of breath, with exertion and Long-Term Oxygen Therapy, asthma , former smoker, PE  Shallow breathing   + decreased breath sounds      Cardiovascular Exercise Tolerance: Poor hypertension, Pt. on medications and Pt. on home beta blockers + angina  + CAD, + Past MI, + CABG and +CHF  + dysrhythmias Atrial Fibrillation + Valvular Problems/Murmurs  Rhythm:Regular Rate:Normal  1. Left ventricular ejection fraction, by estimation, is 65 to 70%. The  left ventricle has normal function. The left ventricle has no regional  wall motion abnormalities. Left ventricular diastolic parameters are  indeterminate.   2. Ventricular septum is flattnened in systole and diastole suggesting RV  pressure and volume overload. RV not well visualized, grossly appears  enlarged with decreased systolic function. . Right ventricular systolic  function was not well visualized. The   right ventricular size is not well visualized. There is severely elevated  pulmonary artery systolic pressure.   3. Left atrial size was mildly dilated.   4. The mitral valve is abnormal. Mild mitral valve regurgitation. No  evidence of mitral stenosis. Moderate mitral annular calcification.   5. Moderate to severe TR, hepatic systolic flow reversal would suggest  severe TR. . Tricuspid valve regurgitation is moderate. 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   6. The aortic valve is tricuspid. There is moderate calcification of the   aortic valve. There is moderate thickening of the aortic valve. Aortic  valve regurgitation is not visualized. No aortic stenosis is present.   7. The pulmonic valve was abnormal.   8. The inferior vena cava is dilated in size with <50% respiratory  variability, suggesting right atrial pressure of 15 mmHg.      Neuro/Psych   Anxiety      Neuromuscular disease    GI/Hepatic hiatal hernia,GERD  Medicated and Controlled,,  Endo/Other  diabetes, Well Controlled, Type 2, Oral Hypoglycemic Agents, Insulin Dependent    Renal/GU Renal disease     Musculoskeletal  (+) Arthritis ,    Abdominal   Peds  Hematology  (+) Blood dyscrasia, anemia   Anesthesia Other Findings   Reproductive/Obstetrics                             Anesthesia Physical Anesthesia Plan  ASA: 4  Anesthesia Plan: General   Post-op Pain Management: Minimal or no pain anticipated   Induction: Intravenous  PONV Risk Score and Plan: Propofol infusion  Airway Management Planned: Nasal Cannula, Natural Airway and Simple Face Mask  Additional Equipment:   Intra-op Plan:   Post-operative Plan: Possible Post-op intubation/ventilation  Informed Consent: I have reviewed the patients History and Physical, chart, labs and discussed the procedure including the risks, benefits and alternatives for the proposed anesthesia with the patient or authorized representative who has indicated his/her understanding and acceptance.    Discussed DNR with power of attorney and Suspend DNR.   Dental advisory given and Consent reviewed with POA  Plan Discussed with: CRNA and Surgeon  Anesthesia Plan Comments:  Anesthesia Quick Evaluation

## 2022-02-27 NOTE — Progress Notes (Signed)
Patient Information  Patient Name Karen Tate, Karen Tate ([id number]) Legal Sex Female DOB 1945/06/25  Room Bed  A325 A325-01    Patient Demographics  Address [place] [address] Phone [phone] (Home) [phone] (Mobile) *Preferred*    Basic Information  Date Of Birth 1945/09/07 Gender Identity Female Race Black or African American Ethnic Group Not Hispanic or Latino Preferred Language English    Patient Contacts  Name Relation Home Work Mobile  [facility] Spouse [phone]    Corena Herter Daughter [phone]  [phone]  Kelli Churn Daughter   [phone]    Documents on File   Status Date Received Description  Documents for the Patient  EMR Patient Summary Not Received    [place] Received 08/25/14 315  [place] E-Signature HIPAA Notice of Privacy Signed 62/83/15   Driver's License Not Received    Insurance Card Received 08/25/14 315  Advance Directives/Living Will/HCPOA/POA Not Received    AMB Outside Hospital Record  11/04/12 06/14 [facility] [place]  AMB Outside Consult Note  11/04/12 06/14 [place]  Other Photo ID Not Received    Insurance Card Received 09/05/17 Medicare/ Aetna [place]  AMB Outside ED Note Received 08/09/17 [place] Outside ED Note Received 08/10/17 [facility]  Release of Information Received 10/02/17 [facility] system wide DPR 6.13.19  AMB Outside Hospital Record Received 08/10/17 D/S [facility]  AMB Outside ED Note Received 08/10/17 [place] Card Received 10/20/17 [facility]  [place] HIPAA NOTICE OF PRIVACY - Scanned Received 10/20/17 [facility]  AMB Outside Hospital Record Received 02/16/18 DISCHARGE SUMMARY REPORT [facility]  AMB Outside Hospital Record Received 02/16/18 DISCHARGE SUMMARY REPORT [facility]  AMB Outside Hospital Record Received 02/16/18 DISCHARGE SUMMARY REPORT [facility]  HIM ROI  Authorization Received 05/03/19 Stoney Bang A MD  Insurance Card Received 05/06/19 Medicare/Aetna TCTS  Release of Information Received 05/06/19 DPR TCTS 2021  HIM ROI Authorization  05/27/19 [facility] INTERNAL MEDICINE  AMB HH/NH/Hospice Received 05/17/19 [facility] [place] HIPAA NOTICE OF PRIVACY - Scanned Received 06/22/19 [facility]  Insurance Card Received (Expired) 06/29/19   AMB Correspondence Received 11/16/19 [facility] MEDICINE [facility]  Insurance Card Received (Expired) 05/08/20   Insurance Card Received (Expired) 05/09/20 HUMANA///NCDL///[facility]  [place] HIPAA NOTICE OF PRIVACY - Scanned Received 05/09/20 HIPAA///[facility]  Release of Information Received 09/05/20 DPR [facility] 09/05/20  Insurance Card Received 04/25/21 NEW CARD 2023   [employer]  AMB HH/NH/Hospice Received 08/17/21 LETTER/ATTACH [facility]  AMB HH/NH/Hospice Received 09/10/21 [facility]  AMB Outside ED Note Received (Deleted) 08/09/17 [place]  HIM Release of Information Output  05/03/19 Requested records  HIM Release of Information Output  05/03/19 Requested records  HIM Release of Information Output  05/27/19 [place]  (Deleted)    Documents for the Encounter  AOB  (Assignment of Insurance Benefits) Received 02/25/22 VC by pt for AOB 02/25/2022  E-signature AOB     MEDICARE RIGHTS Received 02/25/22 vc by pt 02/25/2022 @ 3am  E-signature Medicare Rights     Annual Exam - E-Signature PCP     ED Patient Billing Extract   ED PB Billing Extract  Cardiac Monitoring Strip Shift Summary Received 02/25/22   Cardiac Monitoring Strip Received 02/26/22   EKG Received (Deleted) 02/25/22   Echocardiogram Complete Received 02/25/22   Upper Endoscopy Electronic Received 02/26/22   EKG Received 02/25/22   Colonoscopy Electronic Received 02/27/22  Admission Information   Current Information  Attending Provider Admitting Provider Admission Type Admission  Status  Murlean Iba, MD Orson Eva, MD Emergency Confirmed Admission        Admission Date/Time Discharge Date Hospital Service Auth/Cert Status  87/86/76  2341  Acute Care Incomplete        Hospital Area Unit Room/Bed   [facility] AP-DEPT 300 A325/A325-01             Admission  Complaint  syncope    Hospital Account  Name Acct ID Class Status Primary Coverage  [place]ve, [place] [id number] Inpatient Open [place] PPO        Guarantor Account (for Hospital Account [id number])  Name Relation to Pt Service Area Active? Acct Type  [name] Self [facility] Yes Personal/Family  Address Phone    [place], [place] [address] [phone])          Coverage Information (for Hospital Account [id number])  F/O Payor/Plan Precert #  [facility] ADVANTAGE/[employer] ADVANTAGE PPO   Subscriber Subscriber #  Lani, [place] [id number]  Address Phone  [place] [address] [place] [address] [phone]         Care Everywhere ID:  [id number]

## 2022-02-27 NOTE — Progress Notes (Signed)
Tap water enema given x1. Patient able to retain water for 1-2 minutes. No signs of distress noted during enema administration. Clear/yellow/watery stool returned. Will pass along for day RN to repeat second tap water enema per orders.

## 2022-02-27 NOTE — Progress Notes (Deleted)
Ambulatory referral to Physical Therapy (Order [id number]) Outpatient Referral Date: 02/27/2022 Department: [facility] Ordering/Authorizing: Murlean Iba, MD     Murlean Iba, MD NPI: [id number]    Patient Information  Patient Name Karen Tate, Karen Tate Legal Sex Female DOB March 19, 1946 SSN [id number]   Order Information  Order Date/Time Release Date/Time Start Date/Time End Date/Time  02/27/22 11:49 AM None 02/27/2022 None   Order History Outpatient Date/Time Action Taken User Additional Information  02/27/22 1149 Sign Murlean Iba, MD Modify from [id number]   Reference Links       Associated Diagnoses  Acute blood loss anemia (ABLA)      Ambulatory referral to Physical Therapy: Patient Communication   Not Released  Not seen     Collection Information         Encounter  View Encounter           Ambulatory referral to Physical Therapy Order: [id number] Status: Active      Visible to patient: No (not released)      Next appt: 05/28/2022 at 10:00 AM in Oncology ([facility])      Dx: Acute blood loss anemia (ABLA)    0 Result Notes             Order Details       View Encounter       Lab and Collection Details       Routing       Result History     View All Conversations on this Encounter        Result Care Coordination   Patient Communication   Not Released  Not seen Back to Top

## 2022-02-27 NOTE — Progress Notes (Signed)
PROGRESS NOTE  Karen Tate FGH:[id number] DOB: [birth year] DOA: 02/24/2022 PCP: Neale Burly, MD  Brief History:  76 year old female with a history of coronary artery disease status post CABG 2019, HFpEF, diabetes mellitus type 2, hyperlipidemia, postoperative atrial fibrillation, non-small cell lung cancer status post lobectomy 05/13/2019, COPD, chronic respiratory failure on 2 L and pulmonary hypertension presenting with a syncopal episode.  The patient spouse was helping the patient get back into the bedroom when she had a syncopal episode lasting about 2 minutes.  There is no postictal symptoms.  There is no tonic-clonic activity.  However, the patient states that she has had increasing shortness of breath and dyspnea on exertion for the past 2 months, worst in the past 2 to 3 weeks.  She denies any chest pain, coughing, hemoptysis.  She has not had any fevers, chills, nausea, vomiting, diarrhea, but she states that she has been having some melanotic stools. In the ED, the patient was afebrile and hemodynamically stable with oxygen saturation 100% on room 2 L.  WBC 6.5, hemoglobin 7.4, platelets 255,000.  FOBT was positive. Sodium 140, potassium 5.7, bicarbonate 31, serum creatinine 2.09.  Chest x-ray showed increased interstitial markings. Cardiology and GI were consulted to assist with management.   Assessment/Plan: Syncope -Likely secondary to blood loss anemia with drop in hemoglobin from 10.7 (11/07/21)>> 7.4 -EEG--neg for seizure -Cardiology consult appreciated -Remain on telemetry>>afib with SVR -07/21/2020 echo EF 60 to 65%,+WMX, grade 2 DD, RVSP 61.3, moderate TR/MR -02/25/22 Echo EF 65-70%, no WMA, RV overload, mod-severe TR  Melena/Acute Blood Loss Anemia -GI consult appreciated and planning endoscopy later today 12/6 -Hgb 10.7 (11/07/21)>>7.4>>6.7 -2 units PRBC given, Hg now 9.2 -continue protonix bid -12/5 EGD--normal stomach/duodenum, 3 cm hiatus hernia -plan for  colonoscopy 12/6 -continue pantoprazole IV bid   Acute on chronic diastolic CHF -The patient does have some signs of fluid overload -Cardiology following -holding off of furosemide temporarily -07/21/2020 echo EF 60 to 65%, +WMA, grade 2 DD, moderate TR, RSVP 61.3 -02/25/22 Echo EF 65-70%, no WMA, RV overload, mod-severe TR   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 -Baseline creatinine 1.0-1.3 -Patient presented with serum creatinine 2.09 -Monitor with diuresis -Hold lisinopril   Hyperkalemia -Presented with serum potassium 5.7 -Lokelma given -treated and resolved (4.5)  Afib, type unspecified -SVR--HR 40-60 -holding coreg due to severe bradycardia  -not a candidate for [facility] due to GI bleed  Carotid stenosis -has had known stenosis -not a candidate for operative intervention given age, frailty, poor baseline function -R-ICA <50%, Left ICA 70%   Diabetes mellitus type 2 with hyperglycemia -11/12/2021 hemoglobin A1c 7.5 -NovoLog sliding scale -Holding metformin CBG (last 3)  Recent Labs    02/26/22 2102 02/27/22 0720 02/27/22 0943  GLUCAP 124* 95 94    Essential hypertension -Holding carvedilol secondary bradycardia -Hold lisinopril secondary to hyperkalemia and renal failure -Hold amlodipine secondary to soft blood pressure -BPs remains controlled   COPD -Stable without exacerbation -Continue Advair   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 Delton Coombes   Mixed Hyperlipidemia -continue statin   Pulmonary HTN -- Most likely secondary to her chronic O2 dependent lung disease, left sided heart diseas  -- no further testing at this time per cardiology given pt's frailty and co-morbidities     Family Communication:   spouse and daughter updated at bedside    Consultants:  GI, cardiology   Code Status:  FULL  DVT Prophylaxis:  SCDs     Procedures: As Listed in Progress Note Above   Antibiotics: None  Subjective: Pt  agreeable to endoscopy today.  No specific complaints.    Objective: Vitals:   02/27/22 0607 02/27/22 0719 02/27/22 0936 02/27/22 1004  BP:   (!) 148/59   Pulse:   75   Resp:   (!) 21 (!) 30  Temp:      TempSrc:   Oral   SpO2:  98% 97%   Weight: 85.4 kg  85.4 kg   Height:   5\' 5"  (1.651 m)     Intake/Output Summary (Last 24 hours) at 02/27/2022 1202 Last data filed at 02/27/2022 1158 Gross per 24 hour  Intake 1053.5 ml  Output --  Net 1053.5 ml   Weight change:  Exam:  General:  Pt is alert, follows commands appropriately, not in acute distress HEENT: No icterus, No thrush, No neck mass, [place]/AT Cardiovascular: normal S1/S2 sounds, no rubs, no gallops Respiratory: bibasilar crackles. No wheeze Abdomen: Soft/+BS, non tender, non distended, no guarding Extremities: trace LE edema persists, No lymphangitis, No petechiae, No rashes, no synovitis  Data Reviewed: I have personally reviewed following labs and imaging studies Basic Metabolic Panel: Recent Labs  Lab 02/25/22 0004 02/25/22 0238 02/25/22 7628 02/25/22 0943 02/26/22 0255 02/26/22 0614 02/26/22 1821 02/26/22 2215 02/27/22 0230 02/27/22 0557 02/27/22 0558  NA 140  --  138  --  139  --   --   --   --  138  --   K 5.7*   < > 5.7*   < > 4.8   < > 4.4 4.5 4.5 4.4 4.5  CL 99  --  100  --  100  --   --   --   --  102  --   CO2 31  --  30  --  31  --   --   --   --  29  --   GLUCOSE 188*  --  152*  --  83  --   --   --   --  85  --   BUN 34*  --  37*  --  32*  --   --   --   --  23  --   CREATININE 2.09*  --  1.92*  --  1.83*  --   --   --   --  1.41*  --   CALCIUM 8.9  --  8.5*  --  8.5*  --   --   --   --  8.5*  --   MG  --   --   --   --  2.2  --   --   --   --  2.0  --   PHOS  --   --   --   --  4.5  --   --   --   --   --   --    < > = values in this interval not displayed.   Liver Function Tests: Recent Labs  Lab 02/26/22 0255  AST 15  ALT 11  ALKPHOS 47  BILITOT 0.9  PROT 6.3*  ALBUMIN 2.7*   No  results for input(s): "LIPASE", "AMYLASE" in the last 168 hours. No results for input(s): "AMMONIA" in the last 168 hours. Coagulation Profile: No results for input(s): "INR", "PROTIME" in the last 168 hours. CBC: Recent Labs  Lab 02/25/22 0004 02/26/22 0255 02/26/22 3151  02/27/22 0557  WBC 6.5 4.2  --  5.4  HGB 7.4* 6.7* 6.7* 9.2*  HCT 25.4* 22.3* 22.0* 30.5*  MCV 98.1 97.4  --  96.8  PLT 255 217  --  191   Cardiac Enzymes: No results for input(s): "CKTOTAL", "CKMB", "CKMBINDEX", "TROPONINI" in the last 168 hours. BNP: Invalid input(s): "POCBNP" CBG: Recent Labs  Lab 02/26/22 1436 02/26/22 1711 02/26/22 2102 02/27/22 0720 02/27/22 0943  GLUCAP 166* 108* 124* 95 94   HbA1C: Recent Labs    23-Mar-2022 0637  HGBA1C 6.8*   Urine analysis:    Component Value Date/Time   COLORURINE YELLOW 02/26/2022 [place] 02/26/2022 0519   LABSPEC 1.013 02/26/2022 0519   PHURINE 5.0 02/26/2022 0519   GLUCOSEU NEGATIVE 02/26/2022 0519   HGBUR NEGATIVE 02/26/2022 0519   BILIRUBINUR NEGATIVE 02/26/2022 0519   KETONESUR NEGATIVE 02/26/2022 0519   PROTEINUR 30 (A) 02/26/2022 0519   NITRITE NEGATIVE 02/26/2022 0519   LEUKOCYTESUR TRACE (A) 02/26/2022 0519    Recent Results (from the past 240 hour(s))  Urine Culture     Status: Abnormal   Collection Time: 02/26/22  5:19 AM   Specimen: Urine, Clean Catch  Result Value Ref Range Status   Specimen Description   Final    URINE, CLEAN CATCH Performed at [facility], [address] [place], [place] [address]    Special Requests   Final    NONE Performed at [facility], [address], [place], [place] [address]    Culture (A)  Final    20,000 COLONIES/mL GROUP B STREP(S.AGALACTIAE)ISOLATED TESTING AGAINST S. AGALACTIAE NOT ROUTINELY PERFORMED DUE TO PREDICTABILITY OF AMP/PEN/VAN SUSCEPTIBILITY. Performed at [facility], [place] [address], [place], [place] [address]    Report Status 02/27/2022 FINAL  Final      Scheduled Meds:  [MAR Hold] sodium chloride   Intravenous Once   [MAR Hold] ALPRAZolam  0.5 mg Oral BID   [MAR Hold] aspirin EC  81 mg Oral Daily   [MAR Hold] atorvastatin  80 mg Oral q1800   [MAR Hold] insulin aspart  0-5 Units Subcutaneous QHS   [MAR Hold] insulin aspart  0-9 Units Subcutaneous TID WC   ipratropium-albuterol       [MAR Hold] mometasone-formoterol  2 puff Inhalation BID   [MAR Hold] pantoprazole (PROTONIX) IV  40 mg Intravenous Q12H   Continuous Infusions:  sodium chloride 20 mL/hr at 02/27/22 0341   lactated ringers 50 mL/hr at 02/27/22 1143    Procedures/Studies: EEG adult  Result Date: 2022-03-23 Spero Curb, MD     Mar 23, 2022  9:29 PM [facility] Routine EEG Report Video Performed: Performed Demographics: Patient Name:   Clariece, Roesler Date of Birth:   1946-01-15 Identification Number:   MRN - [id number] Study Times: Study Start Time:   03/23/22 13:33:00 Study End Time:   03/23/22 14:16:00 Indication(s): Syncope Medication: Xanax Technical Summary: This EEG was performed utilizing standard International 10-20 System of electrode placement. One channel electrocardiogram was monitored. Data were obtained, stored, and interpreted utilizing referential montage recording, with reformatting to longitudinal, transverse bipolar, and referential montages as necessary for interpretation. State(s):       Awake      Drowsy Activation Procedures: Hyperventilation: Not performed Photic Stimulation: Not performed EEG Description: During wakefulness, the background showed a continuous 8 Hz posterior dominant alpha rhythm which is fairly modulated, symmetrical and reactive to eye opening.  Drowsiness demonstrated a background attenuation. Sleep stage II was  not reached.  There was no clinical event noted.  Throughout the record, there was no epileptiform abnormality or lateralizing sign observed.  Intermittent myogenic and movement  artifacts were noted.   Impression: This is a normal awake and drowsy EEG. No epileptiform abnormality or lateralizing sign is observed.  Note that an absence of epileptiform abnormality does not exclude nor support the diagnosis of epilepsy. Dr Spero Curb [facility] For Inpatient follow-up with [facility] physician please call [facility] [phone]. This is not an outpatient service. Post hospital discharge, please contact hospital directly.   ECHOCARDIOGRAM COMPLETE  Result Date: 02/25/2022    ECHOCARDIOGRAM REPORT   Patient Name:   ALEJA YEARWOOD Date of Exam: 02/25/2022 Medical Rec #:  [id number]    Height:       65.0 in Accession #:    [id number]   Weight:       175.0 lb Date of Birth:  06/19/1945    BSA:          1.869 m Patient Age:    59 years     BP:           116/55 mmHg Patient Gender: F            HR:           57 bpm. Exam Location:  [facility] Procedure: 2D Echo, Cardiac Doppler and Color Doppler Indications:    Syncope  History:        Patient has prior history of Echocardiogram examinations, most                 recent 07/21/2020. CHF, Previous Myocardial Infarction and CAD,                 Prior CABG, Signs/Symptoms:Chest Pain and Syncope; Risk                 Factors:Hypertension, Diabetes and Dyslipidemia.  Sonographer:    Wenda Low Referring Phys: [id number] OLADAPO ADEFESO IMPRESSIONS  1. Left ventricular ejection fraction, by estimation, is 65 to 70%. The left ventricle has normal function. The left ventricle has no regional wall motion abnormalities. Left ventricular diastolic parameters are indeterminate.  2. Ventricular septum is flattnened in systole and diastole suggesting RV pressure and volume overload. RV not well visualized, grossly appears enlarged with decreased systolic function. . Right ventricular systolic function was not well visualized. The  right ventricular size is not well visualized. There is severely elevated pulmonary artery systolic pressure.  3. Left  atrial size was mildly dilated.  4. The mitral valve is abnormal. Mild mitral valve regurgitation. No evidence of mitral stenosis. Moderate mitral annular calcification.  5. Moderate to severe TR, hepatic systolic flow reversal would suggest severe TR. . Tricuspid valve regurgitation is moderate. 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  6. The aortic valve is tricuspid. There is moderate calcification of the aortic valve. There is moderate thickening of the aortic valve. Aortic valve regurgitation is not visualized. No aortic stenosis is present.  7. The pulmonic valve was abnormal.  8. The inferior vena cava is dilated in size with <50% respiratory variability, suggesting right atrial pressure of 15 mmHg. FINDINGS  Left Ventricle: Left ventricular ejection fraction, by estimation, is 65 to 70%. The left ventricle has normal function. The left ventricle has no regional wall motion abnormalities. The left ventricular internal cavity size was normal in size. There is  no left ventricular hypertrophy. Left ventricular diastolic parameters are indeterminate. Right Ventricle: Ventricular septum is flattnened  in systole and diastole suggesting RV pressure and volume overload. RV not well visualized, grossly appears enlarged with decreased systolic function. The right ventricular size is not well visualized. Right vetricular wall thickness was not well visualized. Right ventricular systolic function was not well visualized. There is severely elevated pulmonary artery systolic pressure. The tricuspid regurgitant velocity is 4.20 m/s, and with an assumed right  atrial pressure of 15 mmHg, the estimated right ventricular systolic pressure is 16.1 mmHg. Left Atrium: Left atrial size was mildly dilated. Right Atrium: Right atrial size was not well visualized. Pericardium: There is no evidence of pericardial effusion. Mitral Valve: The mitral valve is abnormal. There is moderate thickening of the mitral valve leaflet(s). There is  moderate calcification of the mitral valve leaflet(s). Moderate mitral annular calcification. Mild mitral valve regurgitation. No evidence of mitral valve stenosis. MV peak gradient, 10.6 mmHg. The mean mitral valve gradient is 3.0 mmHg. Tricuspid Valve: Moderate to severe TR, hepatic systolic flow reversal would suggest severe TR. The tricuspid valve is not well visualized. Tricuspid valve regurgitation is moderate . 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 Aortic Valve: The aortic valve is tricuspid. There is moderate calcification of the aortic valve. There is moderate thickening of the aortic valve. There is moderate aortic valve annular calcification. Aortic valve regurgitation is not visualized. No aortic stenosis is present. Aortic valve mean gradient measures 4.5 mmHg. Aortic valve peak gradient measures 8.8 mmHg. Aortic valve area, by VTI measures 2.74 cm. Pulmonic Valve: The pulmonic valve was abnormal. Pulmonic valve regurgitation is mild. No evidence of pulmonic stenosis. Aorta: The aortic root is normal in size and structure. Venous: The inferior vena cava is dilated in size with less than 50% respiratory variability, suggesting right atrial pressure of 15 mmHg. IAS/Shunts: No atrial level shunt detected by color flow Doppler.  LEFT VENTRICLE PLAX 2D LVIDd:         4.70 cm LVIDs:         2.80 cm LV PW:         1.00 cm LV IVS:        0.80 cm LVOT diam:     1.90 cm LV SV:         101 LV SV Index:   54 LVOT Area:     2.84 cm  RIGHT VENTRICLE RV Basal diam:  4.60 cm RV Mid diam:    3.90 cm LEFT ATRIUM             Index        RIGHT ATRIUM           Index LA diam:        4.40 cm 2.35 cm/m   RA Area:     20.00 cm LA Vol (A2C):   64.7 ml 34.62 ml/m  RA Volume:   60.40 ml  32.32 ml/m LA Vol (A4C):   71.7 ml 38.36 ml/m LA Biplane Vol: 73.4 ml 39.27 ml/m  AORTIC VALVE                    PULMONIC VALVE AV Area (Vmax):    2.62 cm     PV Vmax:       1.14 m/s AV Area (Vmean):   2.61 cm     PV Peak grad:  5.2  mmHg AV Area (VTI):     2.74 cm AV Vmax:           148.50 cm/s AV Vmean:  98.750 cm/s AV VTI:            0.370 m AV Peak Grad:      8.8 mmHg AV Mean Grad:      4.5 mmHg LVOT Vmax:         137.00 cm/s LVOT Vmean:        90.800 cm/s LVOT VTI:          0.357 m LVOT/AV VTI ratio: 0.96  AORTA Ao Root diam: 2.50 cm MITRAL VALVE                TRICUSPID VALVE MV Area (PHT): 4.12 cm     TR Peak grad:   70.6 mmHg MV Area VTI:   1.95 cm     TR Vmax:        420.00 cm/s MV Peak grad:  10.6 mmHg MV Mean grad:  3.0 mmHg     SHUNTS MV Vmax:       1.63 m/s     Systemic VTI:  0.36 m MV Vmean:      71.4 cm/s    Systemic Diam: 1.90 cm MV Decel Time: 184 msec MR Peak grad: 66.9 mmHg MR Mean grad: 39.0 mmHg MR Vmax:      409.00 cm/s MR Vmean:     297.0 cm/s MV E velocity: 162.00 cm/s Carlyle Dolly MD Electronically signed by Carlyle Dolly MD Signature Date/Time: 02/25/2022/4:55:05 PM    Final    US Carotid Bilateral  Result Date: 02/25/2022 CLINICAL DATA:  Syncope Hypertension Hyperlipidemia Diabetes Remote tobacco use history EXAM: BILATERAL CAROTID DUPLEX ULTRASOUND TECHNIQUE: Pearline Cables scale imaging, color Doppler and duplex ultrasound were performed of bilateral carotid and vertebral arteries in the neck. COMPARISON:  None available FINDINGS: Criteria: Quantification of carotid stenosis is based on velocity parameters that correlate the residual internal carotid diameter with NASCET-based stenosis levels, using the diameter of the distal internal carotid lumen as the denominator for stenosis measurement. The following velocity measurements were obtained: RIGHT ICA: 117/24 cm/sec CCA: 27/74 cm/sec SYSTOLIC ICA/CCA RATIO:  1.2 ECA: 241 cm/sec LEFT ICA: 244/46 cm/sec CCA: 12/87 cm/sec SYSTOLIC ICA/CCA RATIO:  3.0 ECA: 100 cm/sec RIGHT CAROTID ARTERY: Mild to moderate shadowing calcified plaque noted at the carotid bifurcation. RIGHT VERTEBRAL ARTERY:  Antegrade flow. LEFT CAROTID ARTERY: Moderate shadowing calcified plaque  of the carotid bifurcation. LEFT VERTEBRAL ARTERY:  Antegrade flow. IMPRESSION: 1. 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 2. Less than 50% stenosis of the right internal carotid artery. Electronically Signed   By: Miachel Roux M.D.   On: 02/25/2022 09:31   DG Chest Port 1 View  Result Date: 02/25/2022 CLINICAL DATA:  Syncope. EXAM: PORTABLE CHEST 1 VIEW COMPARISON:  October 21, 2021 FINDINGS: Multiple sternal wires and vascular clips are noted. The cardiac silhouette is mildly enlarged and unchanged in size. There is stable mild to moderate severity elevation of the right hemidiaphragm. Chronic appearing increased lung markings are seen without evidence of an acute infiltrate, pleural effusion or pneumothorax. Multilevel degenerative changes are seen throughout the thoracic spine. IMPRESSION: 1. Evidence of prior median sternotomy/CABG. 2. Chronic appearing increased lung markings without acute cardiopulmonary disease. Electronically Signed   By: Virgina Norfolk M.D.   On: 02/25/2022 02:40    Irwin Brakeman, MD  [facility]  If 7PM-7AM, please contact night-coverage [email or web address] Password [email or web address] 02/27/2022, 12:02 PM   LOS: 1 day

## 2022-02-27 NOTE — Plan of Care (Signed)
  Problem: Coping: Goal: Ability to adjust to condition or change in health will improve Outcome: Progressing   

## 2022-02-27 NOTE — Op Note (Signed)
[facility] Patient Name: Karen Tate Procedure Date: 02/27/2022 10:59 AM MRN: [id number] Date of Birth: 08-Dec-1945 Attending MD: Norvel Richards , MD, [id number] CSN: [id number] Age: 76 Admit Type: Outpatient Procedure:                Colonoscopy Indications:              Hematochezia; anemia Providers:                Norvel Richards, MD, Lambert Mody,                            Everardo Pacific Referring MD:              Medicines:                Propofol per Anesthesia Complications:            No immediate complications. Estimated Blood Loss:     Estimated blood loss was minimal. Procedure:                Pre-Anesthesia Assessment:                           - Prior to the procedure, a History and Physical                            was performed, and patient medications and                            allergies were reviewed. The patient's tolerance of                            previous anesthesia was also reviewed. The risks                            and benefits of the procedure and the sedation                            options and risks were discussed with the patient.                            All questions were answered, and informed consent                            was obtained. Prior Anticoagulants: The patient has                            taken no anticoagulant or antiplatelet agents. ASA                            Grade Assessment: III - A patient with severe                            systemic disease. After reviewing the risks and  benefits, the patient was deemed in satisfactory                            condition to undergo the procedure.                           After obtaining informed consent, the colonoscope                            was passed under direct vision. Throughout the                            procedure, the patient's blood pressure, pulse, and                            oxygen saturations were  monitored continuously. The                            [id number]) scope was introduced through                            the anus and advanced to the the cecum, identified                            by appendiceal orifice and ileocecal valve. The                            colonoscopy was performed without difficulty. The                            patient tolerated the procedure well. The quality                            of the bowel preparation was adequate. The                            ileocecal valve, appendiceal orifice, and rectum                            were photographed. Scope In: 11:53:16 AM Scope Out: 12:23:40 PM Scope Withdrawal Time: 0 hours 13 minutes 51 seconds  Total Procedure Duration: 0 hours 30 minutes 24 seconds  Findings:      The perianal and digital rectal examinations were normal. Markedly       redundant colon requiring external abdominal pressure to reach the       cecum. Unable to intubate the terminal ileum because of redundancy and       angulation.      A 6 mm polyp was found in the descending colon. The polyp was sessile.       The polyp was removed with a cold snare. Resection and retrieval were       complete. Estimated blood loss was minimal.      The exam was otherwise without abnormality. Rectal vault too small to       retroflex. Rectal mucosa seen well on?"face. Impression:               -  One 6 mm polyp in the descending colon, removed                            with a cold snare. Resected and retrieved. Markedly                            redundant colon.                           - The examination was otherwise normal. Patient                            with significant drop in hemoglobin and Hemoccult                            positive. Negative EGD and colonoscopy. While she                            is here, we should go ahead and complete                            examination with a VCE. I discussed this approach                             at length with the patient's daughter Corena Herter                            at [phone]. Will keep her n.p.o. and plan to                            deploy capsule at 1700 today. Moderate Sedation:      Moderate (conscious) sedation was personally administered by an       anesthesia professional. The following parameters were monitored: oxygen       saturation, heart rate, blood pressure, respiratory rate, EKG, adequacy       of pulmonary ventilation, and response to care. Recommendation:           - Return patient to hospital ward for ongoing care.                           - NPO.                           - Patient has a contact number available for                            emergencies. The signs and symptoms of potential                            delayed complications were discussed with the                            patient. Return to normal activities tomorrow.  Written discharge instructions were provided to the                            patient.                           - Repeat colonoscopy date to be determined after                            pending pathology results are reviewed for                            surveillance.                           - Return to GI office (date not yet determined). Procedure Code(s):        --- Professional ---                           [phone], Colonoscopy, flexible; with removal of                            tumor(s), polyp(s), or other lesion(s) by snare                            technique Diagnosis Code(s):        --- Professional ---                           D12.4, Benign neoplasm of descending colon                           K92.1, Melena (includes Hematochezia) CPT copyright 2022 [employer]. All rights reserved. The codes documented in this report are preliminary and upon coder review may  be revised to meet current compliance requirements. Cristopher Estimable. Aariona Momon,  MD Norvel Richards, MD 02/27/2022 77:11:65 PM This report has been signed electronically. Number of Addenda: 0

## 2022-02-27 NOTE — Anesthesia Postprocedure Evaluation (Signed)
Anesthesia Post Note  Patient: Karen Tate  Procedure(s) Performed: COLONOSCOPY WITH PROPOFOL POLYPECTOMY  Patient location during evaluation: Phase II Anesthesia Type: General Level of consciousness: awake and alert and oriented Pain management: pain level controlled Vital Signs Assessment: post-procedure vital signs reviewed and stable Respiratory status: spontaneous breathing, nonlabored ventilation, respiratory function stable and patient connected to nasal cannula oxygen Cardiovascular status: blood pressure returned to baseline and stable Postop Assessment: no apparent nausea or vomiting Anesthetic complications: no  No notable events documented.   Last Vitals:  Vitals:   02/27/22 1245 02/27/22 1300  BP: (!) 133/58 (!) 133/54  Pulse: 70 72  Resp: (!) 36 (!) 31  Temp:    SpO2: 91% 99%    Last Pain:  Vitals:   02/27/22 1245  TempSrc:   PainSc: 0-No pain                 Kelee Cunningham C Dryden Tapley

## 2022-02-27 NOTE — Transfer of Care (Signed)
Immediate Anesthesia Transfer of Care Note  Patient: Karen Tate  Procedure(s) Performed: COLONOSCOPY WITH PROPOFOL POLYPECTOMY  Patient Location: PACU  Anesthesia Type:General  Level of Consciousness: awake, alert , oriented, and patient cooperative  Airway & Oxygen Therapy: Patient Spontanous Breathing and Patient connected to face mask oxygen  Post-op Assessment: Report given to RN, Post -op Vital signs reviewed and stable, and Patient moving all extremities  Post vital signs: Reviewed and stable  Last Vitals:  Vitals Value Taken Time  BP 120/45 02/27/22 1223  Temp    Pulse 71 02/27/22 1224  Resp 22 02/27/22 1224  SpO2 94 % 02/27/22 1224  Vitals shown include unvalidated device data.  Last Pain:  Vitals:   02/27/22 1147  TempSrc:   PainSc: 0-No pain      Patients Stated Pain Goal: 5 (13/08/65 7846)  Complications: No notable events documented.

## 2022-02-28 ENCOUNTER — Inpatient Hospital Stay (HOSPITAL_COMMUNITY): Payer: PPO

## 2022-02-28 DIAGNOSIS — D62 Acute posthemorrhagic anemia: Secondary | ICD-10-CM | POA: Diagnosis not present

## 2022-02-28 DIAGNOSIS — E782 Mixed hyperlipidemia: Secondary | ICD-10-CM | POA: Diagnosis not present

## 2022-02-28 DIAGNOSIS — R55 Syncope and collapse: Secondary | ICD-10-CM | POA: Diagnosis not present

## 2022-02-28 DIAGNOSIS — I4891 Unspecified atrial fibrillation: Secondary | ICD-10-CM | POA: Diagnosis not present

## 2022-02-28 LAB — CBC
HCT: 30.8 % — ABNORMAL LOW (ref 36.0–46.0)
Hemoglobin: 9.3 g/dL — ABNORMAL LOW (ref 12.0–15.0)
MCH: 29.5 pg (ref 26.0–34.0)
MCHC: 30.2 g/dL (ref 30.0–36.0)
MCV: 97.8 fL (ref 80.0–100.0)
Platelets: 203 10*3/uL (ref 150–400)
RBC: 3.15 MIL/uL — ABNORMAL LOW (ref 3.87–5.11)
RDW: 16.9 % — ABNORMAL HIGH (ref 11.5–15.5)
WBC: 6.8 10*3/uL (ref 4.0–10.5)
nRBC: 0 % (ref 0.0–0.2)

## 2022-02-28 LAB — BASIC METABOLIC PANEL
Anion gap: 7 (ref 5–15)
BUN: 18 mg/dL (ref 8–23)
CO2: 30 mmol/L (ref 22–32)
Calcium: 8.9 mg/dL (ref 8.9–10.3)
Chloride: 104 mmol/L (ref 98–111)
Creatinine, Ser: 1.25 mg/dL — ABNORMAL HIGH (ref 0.44–1.00)
GFR, Estimated: 44 mL/min — ABNORMAL LOW (ref >60)
Glucose, Bld: 74 mg/dL (ref 70–99)
Potassium: 4.5 mmol/L (ref 3.5–5.1)
Sodium: 141 mmol/L (ref 135–145)

## 2022-02-28 LAB — MAGNESIUM: Magnesium: 2 mg/dL (ref 1.7–2.4)

## 2022-02-28 LAB — GLUCOSE, CAPILLARY
Glucose-Capillary: 81 mg/dL (ref 70–99)
Glucose-Capillary: 71 mg/dL (ref 70–99)
Glucose-Capillary: 91 mg/dL (ref 70–99)
Glucose-Capillary: 103 mg/dL — ABNORMAL HIGH (ref 70–99)

## 2022-02-28 LAB — SURGICAL PATHOLOGY

## 2022-02-28 MED ORDER — IOHEXOL 300 MG/ML  SOLN
75.0000 mL | Freq: Once | INTRAMUSCULAR | Status: AC | PRN
  Administered 2022-03-01: 75 mL via INTRAVENOUS

## 2022-02-28 MED ORDER — LORAZEPAM 2 MG/ML IJ SOLN
0.5000 mg | INTRAMUSCULAR | Status: DC | PRN
  Administered 2022-02-28 – 2022-03-01 (×2): 0.5 mg via INTRAVENOUS
  Filled 2022-02-28 (×2): qty 1

## 2022-02-28 MED ORDER — POTASSIUM CHLORIDE CRYS ER 20 MEQ PO TBCR
40.0000 meq | EXTENDED_RELEASE_TABLET | Freq: Once | ORAL | Status: AC
  Administered 2022-02-28: 40 meq via ORAL
  Filled 2022-02-28: qty 2

## 2022-02-28 MED ORDER — FUROSEMIDE 10 MG/ML IJ SOLN
30.0000 mg | Freq: Once | INTRAMUSCULAR | Status: AC
  Administered 2022-02-28: 30 mg via INTRAVENOUS
  Filled 2022-02-28: qty 4

## 2022-02-28 MED ORDER — FUROSEMIDE 10 MG/ML IJ SOLN
40.0000 mg | Freq: Once | INTRAMUSCULAR | Status: AC
  Administered 2022-02-28: 40 mg via INTRAVENOUS
  Filled 2022-02-28: qty 4

## 2022-02-28 NOTE — Progress Notes (Signed)
Called into pt's room with c/o SOB. Increased pt's O2 to 8L, with O2 sat 100%. Vitals 98.1, 161/63, Pulse 87, Resp 26. JVD noted on both sides. MD is aware. Will continue to monitor pt.

## 2022-02-28 NOTE — Progress Notes (Signed)
Called into pt's room with c/o SOB. O2 sat 95% on 4L. Placed pt in high fowlers position. Administered IV lasix as ordered also called respiratory for PRN neb treatment. Noted pt has JVD. MD is aware. Chest X-ray ordered. Will continue to monitor pt.

## 2022-02-28 NOTE — Progress Notes (Signed)
Patient complained of SOB throughout the night. SpO2 remained between 95-98% through episodes. Anxiety medications given and patient was able to sleep.

## 2022-02-28 NOTE — Progress Notes (Signed)
   02/28/22 1631  Assess: MEWS Score  Temp 98.1 F (36.7 C)  BP (!) 161/63  MAP (mmHg) 91  Pulse Rate 87  Resp (!) 26  Level of Consciousness Alert  SpO2 100 %  O2 Device Nasal Cannula  Patient Activity (if Appropriate) In bed  O2 Flow Rate (L/min) 8 L/min  Assess: MEWS Score  MEWS Temp 0  MEWS Systolic 0  MEWS Pulse 0  MEWS RR 2  MEWS LOC 0  MEWS Score 2  MEWS Score Color Yellow  Assess: if the MEWS score is Yellow or Red  Were vital signs taken at a resting state? Yes  Focused Assessment Change from prior assessment (see assessment flowsheet)  Does the patient meet 2 or more of the SIRS criteria? No  MEWS guidelines implemented *See Row Information* Yes  Treat  MEWS Interventions Administered prn meds/treatments  Take Vital Signs  Increase Vital Sign Frequency  Yellow: Q 2hr X 2 then Q 4hr X 2, if remains yellow, continue Q 4hrs  Escalate  MEWS: Escalate Yellow: discuss with charge nurse/RN and consider discussing with provider and RRT  Notify: Charge Nurse/RN  Name of Charge Nurse/RN Notified Audrea Muscat, RN  Date Charge Nurse/RN Notified 02/28/22  Time Charge Nurse/RN Notified 1635  Assess: SIRS CRITERIA  SIRS Temperature  0  SIRS Pulse 0  SIRS Respirations  1  SIRS WBC 1  SIRS Score Sum  2

## 2022-02-28 NOTE — Op Note (Signed)
   Small Bowel Givens Capsule Study Procedure date:  02/28/22  Referring Provider:  Dr. Gala Romney PCP:  Dr. Neale Burly, MD  Indication for procedure:  Symptomatic anemia/syncope with 4g drop in Hgb in past 4 months. No overt GI bleeding. Stool hemoccult positive. Takes ASA 81mg  daily but no other NSAIDs. Colonoscopy 02/27/22 with single polyp removed, unable to intubate terminal ileum. EGD 02/26/22 with 3cm hiatal hernia. Capsule endoscopy now being done to further evaluate obscure GI bleeding.   Patient data:  Wt: 182 pounds Ht: 5'5"  Findings: Capsule reached the cecum, entire small bowel visualized. Couple of tiny nonbleeding erosions in the stomach. Several lymphangiectasias noted. Vascular area noted at 1 hour 23 min 55 sec and 1 hour 32 min 27 sec. Small nonbleeding AVM noted at 3 hour 59 min 38 sec. At 4 hour 45 min 29 sec there was another submucosal vascular area with adjacent tiny flecks of blood. Clinical significance unclear.   First Gastric image:  1 min 42 sec First Duodenal image: 16 min 19 sec First Ileo-Cecal Valve image: 5 hr 6 min 26 sec First Cecal image: 5 hr 7 min Gastric Passage time: 14 min Small Bowel Passage time:  4 hr 50 min  Summary & Recommendations:  Single nonbleeding AVM. Area of concern in distal small bowel (4 hr 45 min 29 sec) needing further investigation, submucosal vascular area with adjacent flecks of blood.   Pertinent images reviewed with Dr. Jenetta Downer and Dr. Gala Romney. Recommendations for CT enterography for further evaluation.   Pertinent images to be downloaded to media in EPIC.   Laureen Ochs. Bernarda Caffey [facility] [phone] 12/7/20238:07 PM

## 2022-02-28 NOTE — Progress Notes (Signed)
Rounding Note    Patient Name: Karen Tate Date of Encounter: 02/28/2022  [place] Cardiologist: Carlyle Dolly, MD   Subjective   Some SOB over night  Inpatient Medications    Scheduled Meds:  sodium chloride   Intravenous Once   ALPRAZolam  0.5 mg Oral BID   aspirin EC  81 mg Oral Daily   atorvastatin  80 mg Oral q1800   insulin aspart  0-5 Units Subcutaneous QHS   insulin aspart  0-9 Units Subcutaneous TID WC   mometasone-formoterol  2 puff Inhalation BID   pantoprazole (PROTONIX) IV  40 mg Intravenous Q12H   Continuous Infusions:  PRN Meds: acetaminophen **OR** acetaminophen, albuterol, prochlorperazine   Vital Signs    Vitals:   02/27/22 2052 02/28/22 0420 02/28/22 0506 02/28/22 0759  BP: (!) 127/56  (!) 139/99   Pulse: 65 80 78 76  Resp: 20  18 18   Temp: 98.6 F (37 C)  98.5 F (36.9 C)   TempSrc: Oral  Oral   SpO2: 99% 97% 99% 94%  Weight:   82.6 kg   Height:        Intake/Output Summary (Last 24 hours) at 02/28/2022 0845 Last data filed at 02/27/2022 1811 Gross per 24 hour  Intake 200 ml  Output --  Net 200 ml      02/28/2022    5:06 AM 02/27/2022    9:36 AM 02/27/2022    6:07 AM  Last 3 Weights  Weight (lbs) 182 lb 1.6 oz 188 lb 4.4 oz 188 lb 4.4 oz  Weight (kg) 82.6 kg 85.4 kg 85.4 kg      Telemetry    Rate controlled afib - Personally Reviewed  ECG    N/a - Personally Reviewed  Physical Exam   GEN: No acute distress.   Neck: + JVD Cardiac: irreg, no m/r/g  Respiratory: Clear to auscultation bilaterally. GI: Soft, nontender, non-distended  MS: No edema; No deformity. Neuro:  Nonfocal  Psych: Normal affect   Labs    High Sensitivity Troponin:   Recent Labs  Lab 02/25/22 0238 02/25/22 0637 02/25/22 0856  TROPONINIHS 10 9 10      Chemistry Recent Labs  Lab 02/26/22 0255 02/26/22 0614 02/27/22 0557 02/27/22 0558 02/28/22 0557  NA 139  --  138  --  141  K 4.8   < > 4.4 4.5 4.5  CL 100  --  102  --   104  CO2 31  --  29  --  30  GLUCOSE 83  --  85  --  74  BUN 32*  --  23  --  18  CREATININE 1.83*  --  1.41*  --  1.25*  CALCIUM 8.5*  --  8.5*  --  8.9  MG 2.2  --  2.0  --  2.0  PROT 6.3*  --   --   --   --   ALBUMIN 2.7*  --   --   --   --   AST 15  --   --   --   --   ALT 11  --   --   --   --   ALKPHOS 47  --   --   --   --   BILITOT 0.9  --   --   --   --   GFRNONAA 28*  --  38*  --  44*  ANIONGAP 8  --  7  --  7   < > =  values in this interval not displayed.    Lipids No results for input(s): "CHOL", "TRIG", "HDL", "LABVLDL", "LDLCALC", "CHOLHDL" in the last 168 hours.  Hematology Recent Labs  Lab 02/26/22 0255 02/26/22 0614 02/27/22 0557 02/28/22 0557  WBC 4.2  --  5.4 6.8  RBC 2.29*  --  3.15* 3.15*  HGB 6.7* 6.7* 9.2* 9.3*  HCT 22.3* 22.0* 30.5* 30.8*  MCV 97.4  --  96.8 97.8  MCH 29.3  --  29.2 29.5  MCHC 30.0  --  30.2 30.2  RDW 18.2*  --  17.0* 16.9*  PLT 217  --  191 203   Thyroid  Recent Labs  Lab 02/25/22 0856  TSH 1.729  FREET4 0.86    BNP Recent Labs  Lab 02/25/22 0238  BNP 459.0*    DDimer No results for input(s): "DDIMER" in the last 168 hours.   Radiology    No results found.  Cardiac Studies     Patient Profile     Karen Tate is a 76 y.o. female with a hx of CAD who is being seen 02/25/2022 for the evaluation of syncope and new Afib with slow VR in the setting of GI bleed at the request of Dr. Carles Collet.    Assessment & Plan    1.Afib -new diagnosis this admission - rates mid 50s on initially EKGs, she was on coreg 25mg  bid at home -not anticoag candidate at this time due to GI bleed  -HRs have improved of coreg,s 70s to 80s in afib. She is self rate controlled, would not add back av nodal agent at this time.      2. Anemia - Hgb 7.4 down from 10.7 3 months ago, this AM down to 6.7. Transfused 2 units this admission. Hgb up to 9.3 and stable.  - hemoccult + in ER  - EGD benign - colonscopy: single polyp -plans for pill  endoscopy      3.AKI - Cr 2.09 on admission, baseline 1.1-1.3 - down to 1.83 this AM     4. 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 Some signs of fluid overload, dose IV lasix 40mg  x 1 this AM.         6.Syncope - in setting of GI bleed, Hgb down to 6.7. Unclear if afib with low VR played a role.      7. Carotid stenosis - known disease - advanced age, poor functional status with some dementia don't see her being candidate for intervention. - conitnue medical therapy.    8. 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 Advanced age primarily sedentary with some progresing cognitive decline.  I think the possibility of vasodilators having a role is quite small, would not pursue further testing at this time   For questions  or updates, please contact [place] Please consult [email or web address] for contact info under        Signed, Carlyle Dolly, MD  02/28/2022, 8:45 AM

## 2022-02-28 NOTE — Progress Notes (Signed)
PROGRESS NOTE  Karen Tate HBZ:[id number] DOB: 14-Nov-1945 DOA: 02/24/2022 PCP: Karen Burly, MD  Brief History:  76 year old female with a history of coronary artery disease status post CABG 2019, HFpEF, diabetes mellitus type 2, hyperlipidemia, postoperative atrial fibrillation, non-small cell lung cancer status post lobectomy 05/13/2019, COPD, chronic respiratory failure on 2 L and pulmonary hypertension presenting with a syncopal episode.  The patient spouse was helping the patient get back into the bedroom when she had a syncopal episode lasting about 2 minutes.  There is no postictal symptoms.  There is no tonic-clonic activity.  However, the patient states that she has had increasing shortness of breath and dyspnea on exertion for the past 2 months, worst in the past 2 to 3 weeks.  She denies any chest pain, coughing, hemoptysis.  She has not had any fevers, chills, nausea, vomiting, diarrhea, but she states that she has been having some melanotic stools. In the ED, the patient was afebrile and hemodynamically stable with oxygen saturation 100% on room 2 L.  WBC 6.5, hemoglobin 7.4, platelets 255,000.  FOBT was positive. Sodium 140, potassium 5.7, bicarbonate 31, serum creatinine 2.09.  Chest x-ray showed increased interstitial markings. Cardiology and GI were consulted to assist with management.   Assessment/Plan: Syncope -Likely secondary to blood loss anemia with drop in hemoglobin from 10.7 (11/07/21)>> 7.4>>9.3 -EEG--neg for seizure -Cardiology consult appreciated -Remain on telemetry>>afib with SVR -07/21/2020 echo EF 60 to 65%,+WMX, grade 2 DD, RVSP 61.3, moderate TR/MR -02/25/22 Echo EF 65-70%, no WMA, RV overload, mod-severe TR  Melena/Acute Blood Loss Anemia -GI consult appreciated and planning endoscopy completed 12/6 -Givens capsule given 12/6 per GI service  -Hgb 10.7 (11/07/21)>>7.4>>6.7 -2 units PRBC given, Hg now 9.2 -continue protonix bid -12/5 EGD--normal  stomach/duodenum, 3 cm hiatus hernia -completed colonoscopy 12/6; no source of bleeding found -Givens capsule given 12/6 results pending  -continue pantoprazole IV bid   Acute on chronic diastolic CHF -The patient does have some signs of fluid overload -Cardiology following -IV furosemide given by cardiology 12/7 -07/21/2020 echo EF 60 to 65%, +WMA, grade 2 DD, moderate TR, RSVP 61.3 -02/25/22 Echo EF 65-70%, no WMA, RV overload, mod-severe TR   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 -Baseline creatinine 1.0-1.3 -Patient presented with serum creatinine 2.09 -Monitor with diuresis -Hold lisinopril   Hyperkalemia -Presented with serum potassium 5.7 -Lokelma given -treated and resolved (4.5)  Afib, type unspecified -SVR--HR 40-60 -holding coreg due to severe bradycardia  -not a candidate for [facility] due to GI bleed  Carotid stenosis -has had known stenosis -not a candidate for operative intervention given age, frailty, poor baseline function -R-ICA <50%, Left ICA 70%   Diabetes mellitus type 2 with hyperglycemia -11/12/2021 hemoglobin A1c 7.5 -NovoLog sliding scale -Holding metformin CBG (last 3)  Recent Labs    02/27/22 2055 02/28/22 0714 02/28/22 1119  GLUCAP 85 81 71    Essential hypertension -Holding carvedilol secondary bradycardia -Hold lisinopril secondary to hyperkalemia and renal failure -Hold amlodipine secondary to soft blood pressure -BPs remains controlled   COPD -Stable without exacerbation -Continue Advair   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 Delton Tate   Mixed Hyperlipidemia -continue statin   Pulmonary HTN -- Most likely secondary to her chronic O2 dependent lung disease, left sided heart diseas  -- no further testing at this time per cardiology given pt's frailty and co-morbidities --IV lasix given on 12/7  Family Communication:   spouse and daughter updated at bedside    Consultants:  GI, cardiology    Code Status:  FULL    DVT Prophylaxis:  SCDs     Procedures: As Listed in Progress Note Above   Antibiotics: None  Subjective: Pt reporting that she has no complaints today.  She swallowed the capsule yesterday.     Objective: Vitals:   02/28/22 0420 02/28/22 0506 02/28/22 0759 02/28/22 1300  BP:  (!) 139/99  (!) 134/58  Pulse: 80 78 76 83  Resp:  18 18 (!) 24  Temp:  98.5 F (36.9 C)  98 F (36.7 C)  TempSrc:  Oral  Oral  SpO2: 97% 99% 94% 100%  Weight:  82.6 kg    Height:        Intake/Output Summary (Last 24 hours) at 02/28/2022 1547 Last data filed at 02/27/2022 1811 Gross per 24 hour  Intake 0 ml  Output --  Net 0 ml   Weight change: 6.021 kg Exam:  General:  Pt is alert, follows commands appropriately, not in acute distress HEENT: mild JVD; No icterus, No thrush, No neck mass, [place]/AT Cardiovascular: normal S1/S2 sounds, no rubs, no gallops Respiratory: bibasilar crackles. No wheeze Abdomen: Soft/+BS, non tender, non distended, no guarding Extremities: trace LE edema persists, No lymphangitis, No petechiae, No rashes, no synovitis  Data Reviewed: I have personally reviewed following labs and imaging studies Basic Metabolic Panel: Recent Labs  Lab 02/25/22 0004 02/25/22 0238 02/25/22 8182 02/25/22 0943 02/26/22 0255 02/26/22 0614 02/26/22 2215 02/27/22 0230 02/27/22 0557 02/27/22 0558 02/28/22 0557  NA 140  --  138  --  139  --   --   --  138  --  141  K 5.7*   < > 5.7*   < > 4.8   < > 4.5 4.5 4.4 4.5 4.5  CL 99  --  100  --  100  --   --   --  102  --  104  CO2 31  --  30  --  31  --   --   --  29  --  30  GLUCOSE 188*  --  152*  --  83  --   --   --  85  --  74  BUN 34*  --  37*  --  32*  --   --   --  23  --  18  CREATININE 2.09*  --  1.92*  --  1.83*  --   --   --  1.41*  --  1.25*  CALCIUM 8.9  --  8.5*  --  8.5*  --   --   --  8.5*  --  8.9  MG  --   --   --   --  2.2  --   --   --  2.0  --  2.0  PHOS  --   --   --   --  4.5  --   --    --   --   --   --    < > = values in this interval not displayed.   Liver Function Tests: Recent Labs  Lab 02/26/22 0255  AST 15  ALT 11  ALKPHOS 47  BILITOT 0.9  PROT 6.3*  ALBUMIN 2.7*   No results for input(s): "LIPASE", "AMYLASE" in the last 168 hours. No results for input(s): "AMMONIA" in the last 168 hours. Coagulation Profile: No  results for input(s): "INR", "PROTIME" in the last 168 hours. CBC: Recent Labs  Lab 02/25/22 0004 02/26/22 0255 02/26/22 0614 02/27/22 0557 02/28/22 0557  WBC 6.5 4.2  --  5.4 6.8  HGB 7.4* 6.7* 6.7* 9.2* 9.3*  HCT 25.4* 22.3* 22.0* 30.5* 30.8*  MCV 98.1 97.4  --  96.8 97.8  PLT 255 217  --  191 203   Cardiac Enzymes: No results for input(s): "CKTOTAL", "CKMB", "CKMBINDEX", "TROPONINI" in the last 168 hours. BNP: Invalid input(s): "POCBNP" CBG: Recent Labs  Lab 02/27/22 1232 02/27/22 1634 02/27/22 2055 02/28/22 0714 02/28/22 1119  GLUCAP 103* 85 85 81 71   HbA1C: No results for input(s): "HGBA1C" in the last 72 hours.  Urine analysis:    Component Value Date/Time   COLORURINE YELLOW 02/26/2022 [place] 02/26/2022 0519   LABSPEC 1.013 02/26/2022 0519   PHURINE 5.0 02/26/2022 0519   GLUCOSEU NEGATIVE 02/26/2022 0519   HGBUR NEGATIVE 02/26/2022 0519   BILIRUBINUR NEGATIVE 02/26/2022 0519   KETONESUR NEGATIVE 02/26/2022 0519   PROTEINUR 30 (A) 02/26/2022 0519   NITRITE NEGATIVE 02/26/2022 0519   LEUKOCYTESUR TRACE (A) 02/26/2022 0519    Recent Results (from the past 240 hour(s))  Urine Culture     Status: Abnormal   Collection Time: 02/26/22  5:19 AM   Specimen: Urine, Clean Catch  Result Value Ref Range Status   Specimen Description   Final    URINE, CLEAN CATCH Performed at [facility], [address], [place], [place] [address]    Special Requests   Final    NONE Performed at [facility], [address], [place], [place] [address]    Culture (A)  Final    20,000 COLONIES/mL GROUP B  STREP(S.AGALACTIAE)ISOLATED TESTING AGAINST S. AGALACTIAE NOT ROUTINELY PERFORMED DUE TO PREDICTABILITY OF AMP/PEN/VAN SUSCEPTIBILITY. Performed at [facility], [place] [address] [place], [place] [address]    Report Status 02/27/2022 FINAL  Final     Scheduled Meds:  sodium chloride   Intravenous Once   ALPRAZolam  0.5 mg Oral BID   aspirin EC  81 mg Oral Daily   atorvastatin  80 mg Oral q1800   insulin aspart  0-5 Units Subcutaneous QHS   insulin aspart  0-9 Units Subcutaneous TID WC   mometasone-formoterol  2 puff Inhalation BID   pantoprazole (PROTONIX) IV  40 mg Intravenous Q12H   Continuous Infusions:  Procedures/Studies: DG CHEST PORT 1 VIEW  Result Date: 02/28/2022 CLINICAL DATA:  Shortness of breath. EXAM: PORTABLE CHEST 1 VIEW COMPARISON:  February 25, 2022. FINDINGS: Stable cardiomediastinal silhouette. Status post coronary bypass graft. Stable bibasilar interstitial lung opacities are noted which may represent scarring, but acute superimposed edema cannot be excluded. Small right pleural effusion is noted. Bony thorax is unremarkable. IMPRESSION: Stable bibasilar interstitial densities are noted which may represent scarring, but acute superimposed edema cannot be excluded. Small right pleural effusion. Electronically Signed   By: Marijo Conception M.D.   On: 02/28/2022 11:38   EEG adult  Result Date: 02/25/2022 Spero Curb, MD     02/25/2022  9:29 PM [facility] Routine EEG Report Video Performed: Performed Demographics: Patient Name:   Karen Tate, Karen Tate Date of Birth:   [birth year] Identification Number:   MRN - [id number] Study Times: Study Start Time:   02/25/2022 13:33:00 Study End Time:   02/25/2022 14:16:00 Indication(s): Syncope Medication: Xanax Technical Summary: This EEG was performed utilizing standard International 10-20 System of electrode placement.  One channel electrocardiogram was monitored. Data were obtained,  stored, and interpreted utilizing referential montage recording, with reformatting to longitudinal, transverse bipolar, and referential montages as necessary for interpretation. State(s):       Awake      Drowsy Activation Procedures: Hyperventilation: Not performed Photic Stimulation: Not performed EEG Description: During wakefulness, the background showed a continuous 8 Hz posterior dominant alpha rhythm which is fairly modulated, symmetrical and reactive to eye opening.  Drowsiness demonstrated a background attenuation. Sleep stage II was not reached.  There was no clinical event noted.  Throughout the record, there was no epileptiform abnormality or lateralizing sign observed.  Intermittent myogenic and movement artifacts were noted.   Impression: This is a normal awake and drowsy EEG. No epileptiform abnormality or lateralizing sign is observed.  Note that an absence of epileptiform abnormality does not exclude nor support the diagnosis of epilepsy. Dr Spero Curb [facility] For Inpatient follow-up with [facility] physician please call [facility] [phone]. This is not an outpatient service. Post hospital discharge, please contact hospital directly.   ECHOCARDIOGRAM COMPLETE  Result Date: 02/25/2022    ECHOCARDIOGRAM REPORT   Patient Name:   Karen Tate Date of Exam: 02/25/2022 Medical Rec #:  [id number]    Height:       65.0 in Accession #:    [id number]   Weight:       175.0 lb Date of Birth:  [birth year]    BSA:          1.869 m Patient Age:    89 years     BP:           116/55 mmHg Patient Gender: F            HR:           57 bpm. Exam Location:  [facility] Procedure: 2D Echo, Cardiac Doppler and Color Doppler Indications:    Syncope  History:        Patient has prior history of Echocardiogram examinations, most                 recent 07/21/2020. CHF, Previous Myocardial Infarction and CAD,                 Prior CABG, Signs/Symptoms:Chest Pain and Syncope; Risk                  Factors:Hypertension, Diabetes and Dyslipidemia.  Sonographer:    Wenda Low Referring Phys: [id number] OLADAPO ADEFESO IMPRESSIONS  1. Left ventricular ejection fraction, by estimation, is 65 to 70%. The left ventricle has normal function. The left ventricle has no regional wall motion abnormalities. Left ventricular diastolic parameters are indeterminate.  2. Ventricular septum is flattnened in systole and diastole suggesting RV pressure and volume overload. RV not well visualized, grossly appears enlarged with decreased systolic function. . Right ventricular systolic function was not well visualized. The  right ventricular size is not well visualized. There is severely elevated pulmonary artery systolic pressure.  3. Left atrial size was mildly dilated.  4. The mitral valve is abnormal. Mild mitral valve regurgitation. No evidence of mitral stenosis. Moderate mitral annular calcification.  5. Moderate to severe TR, hepatic systolic flow reversal would suggest severe TR. . Tricuspid valve regurgitation is moderate. 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  6. The aortic valve is tricuspid. There is moderate calcification of the aortic valve. There is moderate thickening of the aortic valve. Aortic valve regurgitation is not visualized. No aortic stenosis is present.  7. The pulmonic valve was abnormal.  8. The inferior vena cava is dilated in size with <50% respiratory variability, suggesting right atrial pressure of 15 mmHg. FINDINGS  Left Ventricle: Left ventricular ejection fraction, by estimation, is 65 to 70%. The left ventricle has normal function. The left ventricle has no regional wall motion abnormalities. The left ventricular internal cavity size was normal in size. There is  no left ventricular hypertrophy. Left ventricular diastolic parameters are indeterminate. Right Ventricle: Ventricular septum is flattnened in systole and diastole suggesting RV pressure and volume overload. RV not well visualized,  grossly appears enlarged with decreased systolic function. The right ventricular size is not well visualized. Right vetricular wall thickness was not well visualized. Right ventricular systolic function was not well visualized. There is severely elevated pulmonary artery systolic pressure. The tricuspid regurgitant velocity is 4.20 m/s, and with an assumed right  atrial pressure of 15 mmHg, the estimated right ventricular systolic pressure is 09.3 mmHg. Left Atrium: Left atrial size was mildly dilated. Right Atrium: Right atrial size was not well visualized. Pericardium: There is no evidence of pericardial effusion. Mitral Valve: The mitral valve is abnormal. There is moderate thickening of the mitral valve leaflet(s). There is moderate calcification of the mitral valve leaflet(s). Moderate mitral annular calcification. Mild mitral valve regurgitation. No evidence of mitral valve stenosis. MV peak gradient, 10.6 mmHg. The mean mitral valve gradient is 3.0 mmHg. Tricuspid Valve: Moderate to severe TR, hepatic systolic flow reversal would suggest severe TR. The tricuspid valve is not well visualized. Tricuspid valve regurgitation is moderate . 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 Aortic Valve: The aortic valve is tricuspid. There is moderate calcification of the aortic valve. There is moderate thickening of the aortic valve. There is moderate aortic valve annular calcification. Aortic valve regurgitation is not visualized. No aortic stenosis is present. Aortic valve mean gradient measures 4.5 mmHg. Aortic valve peak gradient measures 8.8 mmHg. Aortic valve area, by VTI measures 2.74 cm. Pulmonic Valve: The pulmonic valve was abnormal. Pulmonic valve regurgitation is mild. No evidence of pulmonic stenosis. Aorta: The aortic root is normal in size and structure. Venous: The inferior vena cava is dilated in size with less than 50% respiratory variability, suggesting right atrial pressure of 15 mmHg. IAS/Shunts: No  atrial level shunt detected by color flow Doppler.  LEFT VENTRICLE PLAX 2D LVIDd:         4.70 cm LVIDs:         2.80 cm LV PW:         1.00 cm LV IVS:        0.80 cm LVOT diam:     1.90 cm LV SV:         101 LV SV Index:   54 LVOT Area:     2.84 cm  RIGHT VENTRICLE RV Basal diam:  4.60 cm RV Mid diam:    3.90 cm LEFT ATRIUM             Index        RIGHT ATRIUM           Index LA diam:        4.40 cm 2.35 cm/m   RA Area:     20.00 cm LA Vol (A2C):   64.7 ml 34.62 ml/m  RA Volume:   60.40 ml  32.32 ml/m LA Vol (A4C):   71.7 ml 38.36 ml/m LA Biplane Vol: 73.4 ml 39.27 ml/m  AORTIC VALVE  PULMONIC VALVE AV Area (Vmax):    2.62 cm     PV Vmax:       1.14 m/s AV Area (Vmean):   2.61 cm     PV Peak grad:  5.2 mmHg AV Area (VTI):     2.74 cm AV Vmax:           148.50 cm/s AV Vmean:          98.750 cm/s AV VTI:            0.370 m AV Peak Grad:      8.8 mmHg AV Mean Grad:      4.5 mmHg LVOT Vmax:         137.00 cm/s LVOT Vmean:        90.800 cm/s LVOT VTI:          0.357 m LVOT/AV VTI ratio: 0.96  AORTA Ao Root diam: 2.50 cm MITRAL VALVE                TRICUSPID VALVE MV Area (PHT): 4.12 cm     TR Peak grad:   70.6 mmHg MV Area VTI:   1.95 cm     TR Vmax:        420.00 cm/s MV Peak grad:  10.6 mmHg MV Mean grad:  3.0 mmHg     SHUNTS MV Vmax:       1.63 m/s     Systemic VTI:  0.36 m MV Vmean:      71.4 cm/s    Systemic Diam: 1.90 cm MV Decel Time: 184 msec MR Peak grad: 66.9 mmHg MR Mean grad: 39.0 mmHg MR Vmax:      409.00 cm/s MR Vmean:     297.0 cm/s MV E velocity: 162.00 cm/s Carlyle Dolly MD Electronically signed by Carlyle Dolly MD Signature Date/Time: 02/25/2022/4:55:05 PM    Final    US Carotid Bilateral  Result Date: 02/25/2022 CLINICAL DATA:  Syncope Hypertension Hyperlipidemia Diabetes Remote tobacco use history EXAM: BILATERAL CAROTID DUPLEX ULTRASOUND TECHNIQUE: Pearline Cables scale imaging, color Doppler and duplex ultrasound were performed of bilateral carotid and vertebral arteries  in the neck. COMPARISON:  None available FINDINGS: Criteria: Quantification of carotid stenosis is based on velocity parameters that correlate the residual internal carotid diameter with NASCET-based stenosis levels, using the diameter of the distal internal carotid lumen as the denominator for stenosis measurement. The following velocity measurements were obtained: RIGHT ICA: 117/24 cm/sec CCA: 88/41 cm/sec SYSTOLIC ICA/CCA RATIO:  1.2 ECA: 241 cm/sec LEFT ICA: 244/46 cm/sec CCA: 66/06 cm/sec SYSTOLIC ICA/CCA RATIO:  3.0 ECA: 100 cm/sec RIGHT CAROTID ARTERY: Mild to moderate shadowing calcified plaque noted at the carotid bifurcation. RIGHT VERTEBRAL ARTERY:  Antegrade flow. LEFT CAROTID ARTERY: Moderate shadowing calcified plaque of the carotid bifurcation. LEFT VERTEBRAL ARTERY:  Antegrade flow. IMPRESSION: 1. 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 2. Less than 50% stenosis of the right internal carotid artery. Electronically Signed   By: Miachel Roux M.D.   On: 02/25/2022 09:31   DG Chest Port 1 View  Result Date: 02/25/2022 CLINICAL DATA:  Syncope. EXAM: PORTABLE CHEST 1 VIEW COMPARISON:  October 21, 2021 FINDINGS: Multiple sternal wires and vascular clips are noted. The cardiac silhouette is mildly enlarged and unchanged in size. There is stable mild to moderate severity elevation of the right hemidiaphragm. Chronic appearing increased lung markings are seen without evidence of an acute infiltrate, pleural  effusion or pneumothorax. Multilevel degenerative changes are seen throughout the thoracic spine. IMPRESSION: 1. Evidence of prior median sternotomy/CABG. 2. Chronic appearing increased lung markings without acute cardiopulmonary disease. Electronically Signed   By: Virgina Norfolk M.D.   On: 02/25/2022 02:40    Irwin Brakeman, MD  [facility]  If 7PM-7AM, please contact  night-coverage [email or web address] Password [email or web address] 02/28/2022, 3:47 PM   LOS: 2 days

## 2022-03-01 ENCOUNTER — Inpatient Hospital Stay (HOSPITAL_COMMUNITY): Payer: PPO

## 2022-03-01 ENCOUNTER — Encounter (HOSPITAL_COMMUNITY): Payer: Self-pay | Admitting: Internal Medicine

## 2022-03-01 ENCOUNTER — Encounter: Payer: Self-pay | Admitting: Internal Medicine

## 2022-03-01 DIAGNOSIS — R55 Syncope and collapse: Secondary | ICD-10-CM | POA: Diagnosis not present

## 2022-03-01 DIAGNOSIS — I10 Essential (primary) hypertension: Secondary | ICD-10-CM | POA: Diagnosis not present

## 2022-03-01 DIAGNOSIS — E875 Hyperkalemia: Secondary | ICD-10-CM | POA: Diagnosis not present

## 2022-03-01 LAB — GLUCOSE, CAPILLARY
Glucose-Capillary: 87 mg/dL (ref 70–99)
Glucose-Capillary: 86 mg/dL (ref 70–99)
Glucose-Capillary: 82 mg/dL (ref 70–99)
Glucose-Capillary: 89 mg/dL (ref 70–99)

## 2022-03-01 LAB — CBC
HCT: 30.6 % — ABNORMAL LOW (ref 36.0–46.0)
Hemoglobin: 9.1 g/dL — ABNORMAL LOW (ref 12.0–15.0)
MCH: 29.4 pg (ref 26.0–34.0)
MCHC: 29.7 g/dL — ABNORMAL LOW (ref 30.0–36.0)
MCV: 98.7 fL (ref 80.0–100.0)
Platelets: 181 10*3/uL (ref 150–400)
RBC: 3.1 MIL/uL — ABNORMAL LOW (ref 3.87–5.11)
RDW: 16.5 % — ABNORMAL HIGH (ref 11.5–15.5)
WBC: 5.9 10*3/uL (ref 4.0–10.5)
nRBC: 0 % (ref 0.0–0.2)

## 2022-03-01 LAB — BASIC METABOLIC PANEL
Anion gap: 8 (ref 5–15)
BUN: 17 mg/dL (ref 8–23)
CO2: 33 mmol/L — ABNORMAL HIGH (ref 22–32)
Calcium: 9.1 mg/dL (ref 8.9–10.3)
Chloride: 100 mmol/L (ref 98–111)
Creatinine, Ser: 1.2 mg/dL — ABNORMAL HIGH (ref 0.44–1.00)
GFR, Estimated: 46 mL/min — ABNORMAL LOW (ref >60)
Glucose, Bld: 88 mg/dL (ref 70–99)
Potassium: 4.7 mmol/L (ref 3.5–5.1)
Sodium: 141 mmol/L (ref 135–145)

## 2022-03-01 LAB — MAGNESIUM: Magnesium: 1.8 mg/dL (ref 1.7–2.4)

## 2022-03-01 LAB — BRAIN NATRIURETIC PEPTIDE: B Natriuretic Peptide: 1093 pg/mL — ABNORMAL HIGH (ref 0.0–100.0)

## 2022-03-01 MED ORDER — FUROSEMIDE 10 MG/ML IJ SOLN
40.0000 mg | Freq: Once | INTRAMUSCULAR | Status: AC
  Administered 2022-03-01: 40 mg via INTRAVENOUS
  Filled 2022-03-01: qty 4

## 2022-03-01 MED ORDER — AMLODIPINE BESYLATE 5 MG PO TABS
5.0000 mg | ORAL_TABLET | Freq: Every day | ORAL | Status: DC
  Administered 2022-03-01 – 2022-03-03 (×3): 5 mg via ORAL
  Filled 2022-03-01 (×3): qty 1

## 2022-03-01 MED ORDER — FUROSEMIDE 40 MG PO TABS
40.0000 mg | ORAL_TABLET | Freq: Every day | ORAL | Status: DC
  Administered 2022-03-02 – 2022-03-03 (×2): 40 mg via ORAL
  Filled 2022-03-01 (×2): qty 1

## 2022-03-01 NOTE — Progress Notes (Signed)
Rounding Note    Patient Name: Karen Tate Date of Encounter: 03/01/2022  [place] Cardiologist: Carlyle Dolly, MD   Subjective   Breathing improved but not back to baseline  Inpatient Medications    Scheduled Meds:  sodium chloride   Intravenous Once   ALPRAZolam  0.5 mg Oral BID   aspirin EC  81 mg Oral Daily   atorvastatin  80 mg Oral q1800   insulin aspart  0-5 Units Subcutaneous QHS   insulin aspart  0-9 Units Subcutaneous TID WC   mometasone-formoterol  2 puff Inhalation BID   pantoprazole (PROTONIX) IV  40 mg Intravenous Q12H   Continuous Infusions:  PRN Meds: acetaminophen **OR** acetaminophen, albuterol, iohexol, LORazepam, prochlorperazine   Vital Signs    Vitals:   02/28/22 2010 02/28/22 2026 03/01/22 0431 03/01/22 0500  BP:  (!) 144/59 (!) 143/81   Pulse:  90 67   Resp:  18 18   Temp:  98.5 F (36.9 C) 98.1 F (36.7 C)   TempSrc:  Oral    SpO2: 94% 100% 99%   Weight:    80.6 kg  Height:        Intake/Output Summary (Last 24 hours) at 03/01/2022 0827 Last data filed at 03/01/2022 0500 Gross per 24 hour  Intake 0 ml  Output 1400 ml  Net -1400 ml      03/01/2022    5:00 AM 02/28/2022    5:06 AM 02/27/2022    9:36 AM  Last 3 Weights  Weight (lbs) 177 lb 11.1 oz 182 lb 1.6 oz 188 lb 4.4 oz  Weight (kg) 80.6 kg 82.6 kg 85.4 kg      Telemetry    Rate controlled afib - Personally Reviewed  ECG    N/a - Personally Reviewed  Physical Exam   GEN: No acute distress.   Neck: No JVD Cardiac: irreg Respiratory: faint crackles bilaterlaly GI: Soft, nontender, non-distended  MS: No edema; No deformity. Neuro:  Nonfocal  Psych: Normal affect   Labs    High Sensitivity Troponin:   Recent Labs  Lab 02/25/22 0238 02/25/22 0637 02/25/22 0856  TROPONINIHS 10 9 10      Chemistry Recent Labs  Lab 02/26/22 0255 02/26/22 0614 02/27/22 0557 02/27/22 0558 02/28/22 0557 03/01/22 0509  NA 139  --  138  --  141 141  K 4.8    < > 4.4 4.5 4.5 4.7  CL 100  --  102  --  104 100  CO2 31  --  29  --  30 33*  GLUCOSE 83  --  85  --  74 88  BUN 32*  --  23  --  18 17  CREATININE 1.83*  --  1.41*  --  1.25* 1.20*  CALCIUM 8.5*  --  8.5*  --  8.9 9.1  MG 2.2  --  2.0  --  2.0 1.8  PROT 6.3*  --   --   --   --   --   ALBUMIN 2.7*  --   --   --   --   --   AST 15  --   --   --   --   --   ALT 11  --   --   --   --   --   ALKPHOS 47  --   --   --   --   --   BILITOT 0.9  --   --   --   --   --  GFRNONAA 28*  --  38*  --  44* 46*  ANIONGAP 8  --  7  --  7 8   < > = values in this interval not displayed.    Lipids No results for input(s): "CHOL", "TRIG", "HDL", "LABVLDL", "LDLCALC", "CHOLHDL" in the last 168 hours.  Hematology Recent Labs  Lab 02/27/22 0557 02/28/22 0557 03/01/22 0509  WBC 5.4 6.8 5.9  RBC 3.15* 3.15* 3.10*  HGB 9.2* 9.3* 9.1*  HCT 30.5* 30.8* 30.6*  MCV 96.8 97.8 98.7  MCH 29.2 29.5 29.4  MCHC 30.2 30.2 29.7*  RDW 17.0* 16.9* 16.5*  PLT 191 203 181   Thyroid  Recent Labs  Lab 02/25/22 0856  TSH 1.729  FREET4 0.86    BNP Recent Labs  Lab 02/25/22 0238  BNP 459.0*    DDimer No results for input(s): "DDIMER" in the last 168 hours.   Radiology    DG CHEST PORT 1 VIEW  Result Date: 02/28/2022 CLINICAL DATA:  Shortness of breath. EXAM: PORTABLE CHEST 1 VIEW COMPARISON:  February 25, 2022. FINDINGS: Stable cardiomediastinal silhouette. Status post coronary bypass graft. Stable bibasilar interstitial lung opacities are noted which may represent scarring, but acute superimposed edema cannot be excluded. Small right pleural effusion is noted. Bony thorax is unremarkable. IMPRESSION: Stable bibasilar interstitial densities are noted which may represent scarring, but acute superimposed edema cannot be excluded. Small right pleural effusion. Electronically Signed   By: Marijo Conception M.D.   On: 02/28/2022 11:38    Cardiac Studies     Patient Profile     Karen Tate is a 76 y.o.  female with a hx of CAD who is being seen 02/25/2022 for the evaluation of syncope and new Afib with slow VR in the setting of GI bleed at the request of Dr. Carles Collet.     Assessment & Plan  1.Afib -new diagnosis this admission - rates mid 50s on initially EKGs, she was on coreg 25mg  bid at home -not anticoag candidate at this time due to GI bleed   -HRs have improved off coreg,remains 70s to 80s in afib. She is self rate controlled, would not add back av nodal agent at this time.      2. Anemia - Hgb 7.4 down from 10.7 3 months ago, this AM down to 6.7. Transfused 2 units this admission. Hgb up to 9.3 and stable.  - hemoccult + in ER   - EGD benign - colonscopy: single polyp -plans for pill endoscopy       3.AKI - Cr 2.09 on admission, baseline 1.1-1.3 - resolved     4. HTN - soft bp's, bp meds on hold - SBPs 130s-140s. STart back norasc lower dose of 5mg  daily.    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 Reds vest 38% on admit - home diuretic initially held in setting of GI bleed, low bp's - yesterday increasing SOB, dosed IV lasix 40mg  x 1. Incomplete I/Os, renal function is stable -breathing improved but not back to baseline, mild crackles on exam. Dose IV lasix 40mg  x 1 today, resume her home oral lasix 40mg  qAM tomorrow      6.Syncope - in setting of GI bleed, Hgb down to 6.7.  Unclear if afib with low VR played a role.      7. Carotid stenosis - known disease - advanced age, poor functional status with some dementia don't see her being candidate for intervention. - conitnue medical therapy.    8. 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 Advanced age primarily sedentary with some progresing cognitive decline.  I think the possibility of vasodilators having a role is quite small, would not pursue further testing at this time    No additional cardiology recs at this time, we will sign off inpatient care and arrange outpatient f/u    For questions or updates, please contact [place] Please consult [email or web address] for contact info under        Signed, Carlyle Dolly, MD  03/01/2022, 8:27 AM

## 2022-03-01 NOTE — Progress Notes (Signed)
Physical Therapy Treatment Patient Details Name: Karen Tate MRN: [id number] DOB: 10-02-1945 Today's Date: 03/01/2022   History of Present Illness Karen Tate is a 76 y.o. female with medical history significant of CAD s/p CABG, CHF, chronic respiratory failure on supplemental oxygen at 2 LPM via [place], non-small cell carcinoma of right lung (follows with Dr. Delton Coombes) hypertension, hyperlipidemia, T2DM who presents to the emergency department after sustaining a syncopal episode at home. She complained of 60-month history of progressive weakness and increased shortness of breath, she endorsed more than 1 month history of black stool. Patient states that she got up to go to the restroom when she passed out, husband at bedside states that it was only for a few minutes and there was no postictal state on recovery.    PT Comments    Patient requires assist for all mobility today secondary to weakness and frequent rest breaks for fatigue and increasing SOB. She is limited to about a minute of standing with RW and assist today due to fatigue and is assisted back to bed at [facility]. Patient will benefit from continued skilled physical therapy in hospital and recommended venue below to increase strength, balance, endurance for safe ADLs and gait.   Recommendations for follow up therapy are one component of a multi-disciplinary discharge planning process, led by the attending physician.  Recommendations may be updated based on patient status, additional functional criteria and insurance authorization.  Follow Up Recommendations  Home health PT     Assistance Recommended at Discharge Set up Supervision/Assistance  Patient can return home with the following A little help with walking and/or transfers;Help with stairs or ramp for entrance;A little help with bathing/dressing/bathroom;Assistance with cooking/housework   Equipment Recommendations  None recommended by PT    Recommendations for Other Services        Precautions / Restrictions Precautions Precautions: Fall Restrictions Weight Bearing Restrictions: No     Mobility  Bed Mobility Overal bed mobility: Needs Assistance Bed Mobility: Supine to Sit, Sit to Supine     Supine to sit: Mod assist Sit to supine: Mod assist   General bed mobility comments: assist to upright trunk and for BLE in/out of bed    Transfers Overall transfer level: Needs assistance Equipment used: Rolling walker (2 wheels) Transfers: Sit to/from Stand Sit to Stand: Mod assist           General transfer comment: assist to power up to standing, cueing for hand placement/sequencing    Ambulation/Gait                   Stairs             Wheelchair Mobility    Modified Rankin (Stroke Patients Only)       Balance Overall balance assessment: Needs assistance Sitting-balance support: No upper extremity supported, Feet unsupported (ED bed unable to lower for foot support) Sitting balance-Leahy Scale: Fair Sitting balance - Comments: pt leaning to right slightly, seated EOB Postural control: Right lateral lean Standing balance support: Bilateral upper extremity supported, During functional activity, Reliant on assistive device for balance Standing balance-Leahy Scale: Fair Standing balance comment: with RW                            Cognition Arousal/Alertness: Awake/alert Behavior During Therapy: WFL for tasks assessed/performed Overall Cognitive Status: Within Functional Limits for tasks assessed  Exercises General Exercises - Lower Extremity Long Arc Quad: AROM, Both, 10 reps, Seated Hip Flexion/Marching: AROM, Both, 10 reps, Seated Toe Raises: AROM, Both, 10 reps, Seated Heel Raises: AROM, Both, 10 reps, Seated    General Comments        Pertinent Vitals/Pain Pain Assessment Pain Assessment: No/denies pain    Home Living                           Prior Function            PT Goals (current goals can now be found in the care plan section) Acute Rehab PT Goals Patient Stated Goal: return home with family to asssit PT Goal Formulation: With patient/family Time For Goal Achievement: 03/11/22 Potential to Achieve Goals: Good Progress towards PT goals: Progressing toward goals    Frequency    Min 3X/week      PT Plan Current plan remains appropriate    Co-evaluation              AM-PAC PT "6 Clicks" Mobility   Outcome Measure  Help needed turning from your back to your side while in a flat bed without using bedrails?: A Little Help needed moving from lying on your back to sitting on the side of a flat bed without using bedrails?: A Little Help needed moving to and from a bed to a chair (including a wheelchair)?: A Little Help needed standing up from a chair using your arms (e.g., wheelchair or bedside chair)?: A Little Help needed to walk in hospital room?: A Little Help needed climbing 3-5 steps with a railing? : A Lot 6 Click Score: 17    End of Session Equipment Utilized During Treatment: Gait belt;Oxygen Activity Tolerance: Patient tolerated treatment well;Patient limited by fatigue Patient left: in bed;with call bell/phone within reach;with family/visitor present Nurse Communication: Mobility status PT Visit Diagnosis: Unsteadiness on feet (R26.81);Other abnormalities of gait and mobility (R26.89);Muscle weakness (generalized) (M62.81)     Time: 6734-1937 PT Time Calculation (min) (ACUTE ONLY): 22 min  Charges:  $Therapeutic Exercise: 8-22 mins                     2:41 PM, 03/01/22 Mearl Latin PT, DPT Physical Therapist at [facility]

## 2022-03-01 NOTE — Care Management Important Message (Signed)
Important Message  Patient Details  Name: Karen Tate MRN: [id number] Date of Birth: 1945/04/03   Medicare Important Message Given:  Yes     Tommy Medal 03/01/2022, 1:11 PM

## 2022-03-01 NOTE — Progress Notes (Signed)
PROGRESS NOTE  Karen Tate GYF:[id number] DOB: 1946/03/24 DOA: 02/24/2022 PCP: Neale Burly, MD  Brief History:  76 year old female with a history of coronary artery disease status post CABG 2019, HFpEF, diabetes mellitus type 2, hyperlipidemia, postoperative atrial fibrillation, non-small cell lung cancer status post lobectomy 05/13/2019, COPD, chronic respiratory failure on 2 L and pulmonary hypertension presenting with a syncopal episode.  The patient spouse was helping the patient get back into the bedroom when she had a syncopal episode lasting about 2 minutes.  There is no postictal symptoms.  There is no tonic-clonic activity.  However, the patient states that she has had increasing shortness of breath and dyspnea on exertion for the past 2 months, worst in the past 2 to 3 weeks.  She denies any chest pain, coughing, hemoptysis.  She has not had any fevers, chills, nausea, vomiting, diarrhea, but she states that she has been having some melanotic stools. In the ED, the patient was afebrile and hemodynamically stable with oxygen saturation 100% on room 2 L.  WBC 6.5, hemoglobin 7.4, platelets 255,000.  FOBT was positive. Sodium 140, potassium 5.7, bicarbonate 31, serum creatinine 2.09.  Chest x-ray showed increased interstitial markings. Cardiology and GI were consulted to assist with management.   Assessment/Plan: Syncope -Likely secondary to blood loss anemia with drop in hemoglobin from 10.7 (11/07/21)>> 7.4>>9.3 -EEG--neg for seizure -Cardiology consult appreciated -Remain on telemetry>>afib with SVR -07/21/2020 echo EF 60 to 65%,+WMX, grade 2 DD, RVSP 61.3, moderate TR/MR -02/25/22 Echo EF 65-70%, no WMA, RV overload, mod-severe TR  Melena/Acute Blood Loss Anemia -GI consult appreciated and planning endoscopy completed 12/6 -Givens capsule given 12/6 per GI service  -Hgb 10.7 (11/07/21)>>7.4>>6.7 -2 units PRBC given, Hg now 9.2 -continue protonix bid -12/5 EGD--normal  stomach/duodenum, 3 cm hiatus hernia -completed colonoscopy 12/6; no source of bleeding found -Givens capsule given 12/6 results pending  -continue pantoprazole IV bid  GAD  - with benzo dependence - can get severe at times with panic symptoms - added prn IV lorazepam as needed - resume home daily alprazolam   Acute on chronic diastolic CHF -The patient does have some signs of fluid overload -Cardiology following -IV furosemide given by cardiology 12/7 and 12/8 -I gave an extra IV furosemide dose on 12/7  -07/21/2020 echo EF 60 to 65%, +WMA, grade 2 DD, moderate TR, RSVP 61.3 -02/25/22 Echo EF 65-70%, no WMA, RV overload, mod-severe TR   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 -Baseline creatinine 1.0-1.3 -Patient presented with serum creatinine 2.09 -Monitor with diuresis -Hold lisinopril   Hyperkalemia -Presented with serum potassium 5.7 -Lokelma given -treated and resolved (4.5)  Afib, type unspecified -SVR--HR 40-60 -holding coreg due to severe bradycardia  -not a candidate for [facility] due to GI bleed  Carotid stenosis -has had known stenosis -not a candidate for operative intervention given age, frailty, poor baseline function -R-ICA <50%, Left ICA 70%   Diabetes mellitus type 2 with hyperglycemia -11/12/2021 hemoglobin A1c 7.5 -NovoLog sliding scale -Holding metformin CBG (last 3)  Recent Labs    02/28/22 1559 02/28/22 2028 03/01/22 0730  GLUCAP 91 103* 87    Essential hypertension -Holding carvedilol secondary bradycardia -Hold lisinopril secondary to hyperkalemia and renal failure -resuming amlodipine 5 mg daily  -BPs will be monitored    COPD -Stable without exacerbation -Continue Advair   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 Delton Coombes   Mixed Hyperlipidemia -continue statin  Pulmonary HTN -- Most likely secondary to her chronic O2 dependent lung disease, left sided heart diseas  -- no further testing at this  time per cardiology given pt's frailty and co-morbidities --IV lasix given on 12/7, 12/8 with plan to resume home oral lasix 12/9     Family Communication:   spouse and daughter updated at bedside    Consultants:  GI, cardiology   Code Status:  FULL    DVT Prophylaxis:  SCDs     Procedures: As Listed in Progress Note Above   Antibiotics: None  Subjective: Pt having less anxiety and SOB today.       Objective: Vitals:   02/28/22 2010 02/28/22 2026 03/01/22 0431 03/01/22 0500  BP:  (!) 144/59 (!) 143/81   Pulse:  90 67   Resp:  18 18   Temp:  98.5 F (36.9 C) 98.1 F (36.7 C)   TempSrc:  Oral    SpO2: 94% 100% 99%   Weight:    80.6 kg  Height:        Intake/Output Summary (Last 24 hours) at 03/01/2022 1051 Last data filed at 03/01/2022 0500 Gross per 24 hour  Intake 0 ml  Output 1400 ml  Net -1400 ml   Weight change: -4.8 kg Exam:  General:  Pt is alert, follows commands appropriately, not in acute distress HEENT: mild JVD; No icterus, No thrush, No neck mass, [place]/AT Cardiovascular: normal S1/S2 sounds, no rubs, no gallops Respiratory: rare bibasilar crackles. No wheeze Abdomen: Soft/+BS, non tender, non distended, no guarding Extremities: trace LE edema persists, No lymphangitis, No petechiae, No rashes, no synovitis  Data Reviewed: I have personally reviewed following labs and imaging studies Basic Metabolic Panel: Recent Labs  Lab 02/25/22 0637 02/25/22 0943 02/26/22 0255 02/26/22 0614 02/27/22 0230 02/27/22 0557 02/27/22 0558 02/28/22 0557 03/01/22 0509  NA 138  --  139  --   --  138  --  141 141  K 5.7*   < > 4.8   < > 4.5 4.4 4.5 4.5 4.7  CL 100  --  100  --   --  102  --  104 100  CO2 30  --  31  --   --  29  --  30 33*  GLUCOSE 152*  --  83  --   --  85  --  74 88  BUN 37*  --  32*  --   --  23  --  18 17  CREATININE 1.92*  --  1.83*  --   --  1.41*  --  1.25* 1.20*  CALCIUM 8.5*  --  8.5*  --   --  8.5*  --  8.9 9.1  MG  --   --  2.2  --    --  2.0  --  2.0 1.8  PHOS  --   --  4.5  --   --   --   --   --   --    < > = values in this interval not displayed.   Liver Function Tests: Recent Labs  Lab 02/26/22 0255  AST 15  ALT 11  ALKPHOS 47  BILITOT 0.9  PROT 6.3*  ALBUMIN 2.7*   No results for input(s): "LIPASE", "AMYLASE" in the last 168 hours. No results for input(s): "AMMONIA" in the last 168 hours. Coagulation Profile: No results for input(s): "INR", "PROTIME" in the last 168 hours. CBC: Recent Labs  Lab 02/25/22 0004 02/26/22 0255  02/26/22 8676 02/27/22 0557 02/28/22 0557 03/01/22 0509  WBC 6.5 4.2  --  5.4 6.8 5.9  HGB 7.4* 6.7* 6.7* 9.2* 9.3* 9.1*  HCT 25.4* 22.3* 22.0* 30.5* 30.8* 30.6*  MCV 98.1 97.4  --  96.8 97.8 98.7  PLT 255 217  --  191 203 181   Cardiac Enzymes: No results for input(s): "CKTOTAL", "CKMB", "CKMBINDEX", "TROPONINI" in the last 168 hours. BNP: Invalid input(s): "POCBNP" CBG: Recent Labs  Lab 02/28/22 0714 02/28/22 1119 02/28/22 1559 02/28/22 2028 03/01/22 0730  GLUCAP 81 71 91 103* 87   HbA1C: No results for input(s): "HGBA1C" in the last 72 hours.  Urine analysis:    Component Value Date/Time   COLORURINE YELLOW 02/26/2022 [place] 02/26/2022 0519   LABSPEC 1.013 02/26/2022 0519   PHURINE 5.0 02/26/2022 0519   GLUCOSEU NEGATIVE 02/26/2022 0519   HGBUR NEGATIVE 02/26/2022 0519   BILIRUBINUR NEGATIVE 02/26/2022 0519   KETONESUR NEGATIVE 02/26/2022 0519   PROTEINUR 30 (A) 02/26/2022 0519   NITRITE NEGATIVE 02/26/2022 0519   LEUKOCYTESUR TRACE (A) 02/26/2022 0519    Recent Results (from the past 240 hour(s))  Urine Culture     Status: Abnormal   Collection Time: 02/26/22  5:19 AM   Specimen: Urine, Clean Catch  Result Value Ref Range Status   Specimen Description   Final    URINE, CLEAN CATCH Performed at [facility], [address], [place], [place] [address]    Special Requests   Final    NONE Performed at [facility], [address] [place], [place] [address]    Culture (A)  Final    20,000 COLONIES/mL GROUP B STREP(S.AGALACTIAE)ISOLATED TESTING AGAINST S. AGALACTIAE NOT ROUTINELY PERFORMED DUE TO PREDICTABILITY OF AMP/PEN/VAN SUSCEPTIBILITY. Performed at [facility], [place] [address] [place], [place] [address]    Report Status 02/27/2022 FINAL  Final     Scheduled Meds:  sodium chloride   Intravenous Once   ALPRAZolam  0.5 mg Oral BID   amLODipine  5 mg Oral Daily   aspirin EC  81 mg Oral Daily   atorvastatin  80 mg Oral q1800   [START ON 03/02/2022] furosemide  40 mg Oral Daily   insulin aspart  0-5 Units Subcutaneous QHS   insulin aspart  0-9 Units Subcutaneous TID WC   mometasone-formoterol  2 puff Inhalation BID   pantoprazole (PROTONIX) IV  40 mg Intravenous Q12H   Continuous Infusions:  Procedures/Studies: DG CHEST PORT 1 VIEW  Result Date: 02/28/2022 CLINICAL DATA:  Shortness of breath. EXAM: PORTABLE CHEST 1 VIEW COMPARISON:  February 25, 2022. FINDINGS: Stable cardiomediastinal silhouette. Status post coronary bypass graft. Stable bibasilar interstitial lung opacities are noted which may represent scarring, but acute superimposed edema cannot be excluded. Small right pleural effusion is noted. Bony thorax is unremarkable. IMPRESSION: Stable bibasilar interstitial densities are noted which may represent scarring, but acute superimposed edema cannot be excluded. Small right pleural effusion. Electronically Signed   By: Marijo Conception M.D.   On: 02/28/2022 11:38   EEG adult  Result Date: 02/25/2022 Spero Curb, MD     02/25/2022  9:29 PM [facility] Routine EEG Report Video Performed: Performed Demographics: Patient Name:   Eun, Vermeer Date of Birth:   Jul 11, 1945 Identification Number:   MRN - [id number] Study Times: Study Start Time:   02/25/2022 13:33:00 Study End Time:   02/25/2022 14:16:00 Indication(s): Syncope Medication: Xanax Technical  Summary: This EEG was performed  utilizing standard International 10-20 System of electrode placement. One channel electrocardiogram was monitored. Data were obtained, stored, and interpreted utilizing referential montage recording, with reformatting to longitudinal, transverse bipolar, and referential montages as necessary for interpretation. State(s):       Awake      Drowsy Activation Procedures: Hyperventilation: Not performed Photic Stimulation: Not performed EEG Description: During wakefulness, the background showed a continuous 8 Hz posterior dominant alpha rhythm which is fairly modulated, symmetrical and reactive to eye opening.  Drowsiness demonstrated a background attenuation. Sleep stage II was not reached.  There was no clinical event noted.  Throughout the record, there was no epileptiform abnormality or lateralizing sign observed.  Intermittent myogenic and movement artifacts were noted.   Impression: This is a normal awake and drowsy EEG. No epileptiform abnormality or lateralizing sign is observed.  Note that an absence of epileptiform abnormality does not exclude nor support the diagnosis of epilepsy. Dr Spero Curb [facility] For Inpatient follow-up with [facility] physician please call [facility] [phone]. This is not an outpatient service. Post hospital discharge, please contact hospital directly.   ECHOCARDIOGRAM COMPLETE  Result Date: 02/25/2022    ECHOCARDIOGRAM REPORT   Patient Name:   Karen Tate Date of Exam: 02/25/2022 Medical Rec #:  [id number]    Height:       65.0 in Accession #:    [id number]   Weight:       175.0 lb Date of Birth:  1945/06/17    BSA:          1.869 m Patient Age:    18 years     BP:           116/55 mmHg Patient Gender: F            HR:           57 bpm. Exam Location:  [facility] Procedure: 2D Echo, Cardiac Doppler and Color Doppler Indications:    Syncope  History:        Patient has prior history of Echocardiogram examinations, most                  recent 07/21/2020. CHF, Previous Myocardial Infarction and CAD,                 Prior CABG, Signs/Symptoms:Chest Pain and Syncope; Risk                 Factors:Hypertension, Diabetes and Dyslipidemia.  Sonographer:    Wenda Low Referring Phys: [id number] OLADAPO ADEFESO IMPRESSIONS  1. Left ventricular ejection fraction, by estimation, is 65 to 70%. The left ventricle has normal function. The left ventricle has no regional wall motion abnormalities. Left ventricular diastolic parameters are indeterminate.  2. Ventricular septum is flattnened in systole and diastole suggesting RV pressure and volume overload. RV not well visualized, grossly appears enlarged with decreased systolic function. . Right ventricular systolic function was not well visualized. The  right ventricular size is not well visualized. There is severely elevated pulmonary artery systolic pressure.  3. Left atrial size was mildly dilated.  4. The mitral valve is abnormal. Mild mitral valve regurgitation. No evidence of mitral stenosis. Moderate mitral annular calcification.  5. Moderate to severe TR, hepatic systolic flow reversal would suggest severe TR. . Tricuspid valve regurgitation is moderate. 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  6. The aortic valve is tricuspid. There is moderate calcification of the aortic valve. There is moderate thickening of the aortic valve. Aortic valve regurgitation is  not visualized. No aortic stenosis is present.  7. The pulmonic valve was abnormal.  8. The inferior vena cava is dilated in size with <50% respiratory variability, suggesting right atrial pressure of 15 mmHg. FINDINGS  Left Ventricle: Left ventricular ejection fraction, by estimation, is 65 to 70%. The left ventricle has normal function. The left ventricle has no regional wall motion abnormalities. The left ventricular internal cavity size was normal in size. There is  no left ventricular hypertrophy. Left ventricular diastolic parameters are  indeterminate. Right Ventricle: Ventricular septum is flattnened in systole and diastole suggesting RV pressure and volume overload. RV not well visualized, grossly appears enlarged with decreased systolic function. The right ventricular size is not well visualized. Right vetricular wall thickness was not well visualized. Right ventricular systolic function was not well visualized. There is severely elevated pulmonary artery systolic pressure. The tricuspid regurgitant velocity is 4.20 m/s, and with an assumed right  atrial pressure of 15 mmHg, the estimated right ventricular systolic pressure is 03.5 mmHg. Left Atrium: Left atrial size was mildly dilated. Right Atrium: Right atrial size was not well visualized. Pericardium: There is no evidence of pericardial effusion. Mitral Valve: The mitral valve is abnormal. There is moderate thickening of the mitral valve leaflet(s). There is moderate calcification of the mitral valve leaflet(s). Moderate mitral annular calcification. Mild mitral valve regurgitation. No evidence of mitral valve stenosis. MV peak gradient, 10.6 mmHg. The mean mitral valve gradient is 3.0 mmHg. Tricuspid Valve: Moderate to severe TR, hepatic systolic flow reversal would suggest severe TR. The tricuspid valve is not well visualized. Tricuspid valve regurgitation is moderate . 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 Aortic Valve: The aortic valve is tricuspid. There is moderate calcification of the aortic valve. There is moderate thickening of the aortic valve. There is moderate aortic valve annular calcification. Aortic valve regurgitation is not visualized. No aortic stenosis is present. Aortic valve mean gradient measures 4.5 mmHg. Aortic valve peak gradient measures 8.8 mmHg. Aortic valve area, by VTI measures 2.74 cm. Pulmonic Valve: The pulmonic valve was abnormal. Pulmonic valve regurgitation is mild. No evidence of pulmonic stenosis. Aorta: The aortic root is normal in size and structure.  Venous: The inferior vena cava is dilated in size with less than 50% respiratory variability, suggesting right atrial pressure of 15 mmHg. IAS/Shunts: No atrial level shunt detected by color flow Doppler.  LEFT VENTRICLE PLAX 2D LVIDd:         4.70 cm LVIDs:         2.80 cm LV PW:         1.00 cm LV IVS:        0.80 cm LVOT diam:     1.90 cm LV SV:         101 LV SV Index:   54 LVOT Area:     2.84 cm  RIGHT VENTRICLE RV Basal diam:  4.60 cm RV Mid diam:    3.90 cm LEFT ATRIUM             Index        RIGHT ATRIUM           Index LA diam:        4.40 cm 2.35 cm/m   RA Area:     20.00 cm LA Vol (A2C):   64.7 ml 34.62 ml/m  RA Volume:   60.40 ml  32.32 ml/m LA Vol (A4C):   71.7 ml 38.36 ml/m LA Biplane Vol: 73.4 ml 39.27 ml/m  AORTIC VALVE                    PULMONIC VALVE AV Area (Vmax):    2.62 cm     PV Vmax:       1.14 m/s AV Area (Vmean):   2.61 cm     PV Peak grad:  5.2 mmHg AV Area (VTI):     2.74 cm AV Vmax:           148.50 cm/s AV Vmean:          98.750 cm/s AV VTI:            0.370 m AV Peak Grad:      8.8 mmHg AV Mean Grad:      4.5 mmHg LVOT Vmax:         137.00 cm/s LVOT Vmean:        90.800 cm/s LVOT VTI:          0.357 m LVOT/AV VTI ratio: 0.96  AORTA Ao Root diam: 2.50 cm MITRAL VALVE                TRICUSPID VALVE MV Area (PHT): 4.12 cm     TR Peak grad:   70.6 mmHg MV Area VTI:   1.95 cm     TR Vmax:        420.00 cm/s MV Peak grad:  10.6 mmHg MV Mean grad:  3.0 mmHg     SHUNTS MV Vmax:       1.63 m/s     Systemic VTI:  0.36 m MV Vmean:      71.4 cm/s    Systemic Diam: 1.90 cm MV Decel Time: 184 msec MR Peak grad: 66.9 mmHg MR Mean grad: 39.0 mmHg MR Vmax:      409.00 cm/s MR Vmean:     297.0 cm/s MV E velocity: 162.00 cm/s Carlyle Dolly MD Electronically signed by Carlyle Dolly MD Signature Date/Time: 02/25/2022/4:55:05 PM    Final    US Carotid Bilateral  Result Date: 02/25/2022 CLINICAL DATA:  Syncope Hypertension Hyperlipidemia Diabetes Remote tobacco use history EXAM:  BILATERAL CAROTID DUPLEX ULTRASOUND TECHNIQUE: Pearline Cables scale imaging, color Doppler and duplex ultrasound were performed of bilateral carotid and vertebral arteries in the neck. COMPARISON:  None available FINDINGS: Criteria: Quantification of carotid stenosis is based on velocity parameters that correlate the residual internal carotid diameter with NASCET-based stenosis levels, using the diameter of the distal internal carotid lumen as the denominator for stenosis measurement. The following velocity measurements were obtained: RIGHT ICA: 117/24 cm/sec CCA: 57/84 cm/sec SYSTOLIC ICA/CCA RATIO:  1.2 ECA: 241 cm/sec LEFT ICA: 244/46 cm/sec CCA: 69/62 cm/sec SYSTOLIC ICA/CCA RATIO:  3.0 ECA: 100 cm/sec RIGHT CAROTID ARTERY: Mild to moderate shadowing calcified plaque noted at the carotid bifurcation. RIGHT VERTEBRAL ARTERY:  Antegrade flow. LEFT CAROTID ARTERY: Moderate shadowing calcified plaque of the carotid bifurcation. LEFT VERTEBRAL ARTERY:  Antegrade flow. IMPRESSION: 1. 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 2. Less than 50% stenosis of the right internal carotid artery. Electronically Signed   By: Miachel Roux M.D.   On: 02/25/2022 09:31   DG Chest Port 1 View  Result Date: 02/25/2022 CLINICAL DATA:  Syncope. EXAM: PORTABLE CHEST 1 VIEW COMPARISON:  October 21, 2021 FINDINGS: Multiple sternal wires and vascular clips are noted. The cardiac silhouette is mildly enlarged and unchanged in size. There is stable mild to  moderate severity elevation of the right hemidiaphragm. Chronic appearing increased lung markings are seen without evidence of an acute infiltrate, pleural effusion or pneumothorax. Multilevel degenerative changes are seen throughout the thoracic spine. IMPRESSION: 1. Evidence of prior median sternotomy/CABG. 2. Chronic appearing increased lung markings without acute cardiopulmonary disease.  Electronically Signed   By: Virgina Norfolk M.D.   On: 02/25/2022 02:40    Irwin Brakeman, MD  [facility]  If 7PM-7AM, please contact night-coverage [email or web address] Password [email or web address] 03/01/2022, 10:51 AM   LOS: 3 days

## 2022-03-01 NOTE — Progress Notes (Signed)
Gastroenterology Progress Note   Referring Provider: No ref. provider found Primary Care Physician:  Neale Burly, MD Primary Gastroenterologist:  Dr. Abbey Chatters, previously unassigned  Patient ID: Karen Tate; [id number]; [birth year]    Subjective   Denies any nausea, vomiting, or abdominal pain.  States she has passed some gas and has had a bowel movement that was not dark.  States she took barium yesterday but was not taken down for CT scan.   Objective   Vital signs in last 24 hours Temp:  [98 F (36.7 C)-98.6 F (37 C)] 98.6 F (37 C) (12/08 1209) Pulse Rate:  [67-90] 87 (12/08 1209) Resp:  [18-26] 18 (12/08 1209) BP: (134-161)/(58-81) 139/60 (12/08 1209) SpO2:  [94 %-100 %] 94 % (12/08 1209) Weight:  [80.6 kg] 80.6 kg (12/08 0500) Last BM Date : 02/28/22  Physical Exam  General:   Alert and oriented, pleasant Head:  Normocephalic and atraumatic. Eyes:  No icterus, sclera clear. Conjuctiva pink.   Heart: Irregularly irregular, rate controlled Lungs: Clear to auscultation bilaterally, without wheezing, rales, or rhonchi.  Abdomen:  Bowel sounds present, soft, non-tender, non-distended. No HSM or hernias noted. No rebound or guarding. No masses appreciated  Extremities:  Without clubbing or edema. Neurologic:  Alert and  oriented x4;  grossly normal neurologically. Psych:  Alert and cooperative. Normal mood and affect.  Intake/Output from previous day: 12/07 0701 - 12/08 0700 In: 0  Out: 1400 [Urine:1400] Intake/Output this shift: No intake/output data recorded.  Lab Results  Recent Labs    02/27/22 0557 02/28/22 0557 03/01/22 0509  WBC 5.4 6.8 5.9  HGB 9.2* 9.3* 9.1*  HCT 30.5* 30.8* 30.6*  PLT 191 203 181   BMET Recent Labs    02/27/22 0557 02/27/22 0558 02/28/22 0557 03/01/22 0509  NA 138  --  141 141  K 4.4 4.5 4.5 4.7  CL 102  --  104 100  CO2 29  --  30 33*  GLUCOSE 85  --  74 88  BUN 23  --  18 17  CREATININE 1.41*  --  1.25* 1.20*   CALCIUM 8.5*  --  8.9 9.1   LFT No results for input(s): "PROT", "ALBUMIN", "AST", "ALT", "ALKPHOS", "BILITOT", "BILIDIR", "IBILI" in the last 72 hours. PT/INR No results for input(s): "LABPROT", "INR" in the last 72 hours. Hepatitis Panel No results for input(s): "HEPBSAG", "HCVAB", "HEPAIGM", "HEPBIGM" in the last 72 hours.   Studies/Results DG CHEST PORT 1 VIEW  Result Date: 02/28/2022 CLINICAL DATA:  Shortness of breath. EXAM: PORTABLE CHEST 1 VIEW COMPARISON:  February 25, 2022. FINDINGS: Stable cardiomediastinal silhouette. Status post coronary bypass graft. Stable bibasilar interstitial lung opacities are noted which may represent scarring, but acute superimposed edema cannot be excluded. Small right pleural effusion is noted. Bony thorax is unremarkable. IMPRESSION: Stable bibasilar interstitial densities are noted which may represent scarring, but acute superimposed edema cannot be excluded. Small right pleural effusion. Electronically Signed   By: Marijo Conception M.D.   On: 02/28/2022 11:38   EEG adult  Result Date: 02/25/2022 Spero Curb, MD     02/25/2022  9:29 PM [facility] Routine EEG Report Video Performed: Performed Demographics: Patient Name:   Karen, Tate Date of Birth:   1945/09/06 Identification Number:   MRN - [id number] Study Times: Study Start Time:   02/25/2022 13:33:00 Study End Time:   02/25/2022 14:16:00 Indication(s): Syncope Medication: Xanax Technical Summary: This EEG was performed utilizing standard International 10-20  System of electrode placement. One channel electrocardiogram was monitored. Data were obtained, stored, and interpreted utilizing referential montage recording, with reformatting to longitudinal, transverse bipolar, and referential montages as necessary for interpretation. State(s):       Awake      Drowsy Activation Procedures: Hyperventilation: Not performed Photic Stimulation: Not performed  EEG Description: During wakefulness, the background showed a continuous 8 Hz posterior dominant alpha rhythm which is fairly modulated, symmetrical and reactive to eye opening.  Drowsiness demonstrated a background attenuation. Sleep stage II was not reached.  There was no clinical event noted.  Throughout the record, there was no epileptiform abnormality or lateralizing sign observed.  Intermittent myogenic and movement artifacts were noted.   Impression: This is a normal awake and drowsy EEG. No epileptiform abnormality or lateralizing sign is observed.  Note that an absence of epileptiform abnormality does not exclude nor support the diagnosis of epilepsy. Dr Spero Curb [facility] For Inpatient follow-up with [facility] physician please call [facility] [phone]. This is not an outpatient service. Post hospital discharge, please contact hospital directly.   ECHOCARDIOGRAM COMPLETE  Result Date: 02/25/2022    ECHOCARDIOGRAM REPORT   Patient Name:   Karen Tate Date of Exam: 02/25/2022 Medical Rec #:  [id number]    Height:       65.0 in Accession #:    [id number]   Weight:       175.0 lb Date of Birth:  1945-09-14    BSA:          1.869 m Patient Age:    86 years     BP:           116/55 mmHg Patient Gender: F            HR:           57 bpm. Exam Location:  [facility] Procedure: 2D Echo, Cardiac Doppler and Color Doppler Indications:    Syncope  History:        Patient has prior history of Echocardiogram examinations, most                 recent 07/21/2020. CHF, Previous Myocardial Infarction and CAD,                 Prior CABG, Signs/Symptoms:Chest Pain and Syncope; Risk                 Factors:Hypertension, Diabetes and Dyslipidemia.  Sonographer:    Wenda Low Referring Phys: [id number] OLADAPO ADEFESO IMPRESSIONS  1. Left ventricular ejection fraction, by estimation, is 65 to 70%. The left ventricle has normal function. The left ventricle has no regional wall motion abnormalities. Left  ventricular diastolic parameters are indeterminate.  2. Ventricular septum is flattnened in systole and diastole suggesting RV pressure and volume overload. RV not well visualized, grossly appears enlarged with decreased systolic function. . Right ventricular systolic function was not well visualized. The  right ventricular size is not well visualized. There is severely elevated pulmonary artery systolic pressure.  3. Left atrial size was mildly dilated.  4. The mitral valve is abnormal. Mild mitral valve regurgitation. No evidence of mitral stenosis. Moderate mitral annular calcification.  5. Moderate to severe TR, hepatic systolic flow reversal would suggest severe TR. . Tricuspid valve regurgitation is moderate. 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  6. The aortic valve is tricuspid. There is moderate calcification of the aortic valve. There is moderate thickening of the aortic valve. Aortic valve regurgitation is not visualized. No  aortic stenosis is present.  7. The pulmonic valve was abnormal.  8. The inferior vena cava is dilated in size with <50% respiratory variability, suggesting right atrial pressure of 15 mmHg. FINDINGS  Left Ventricle: Left ventricular ejection fraction, by estimation, is 65 to 70%. The left ventricle has normal function. The left ventricle has no regional wall motion abnormalities. The left ventricular internal cavity size was normal in size. There is  no left ventricular hypertrophy. Left ventricular diastolic parameters are indeterminate. Right Ventricle: Ventricular septum is flattnened in systole and diastole suggesting RV pressure and volume overload. RV not well visualized, grossly appears enlarged with decreased systolic function. The right ventricular size is not well visualized. Right vetricular wall thickness was not well visualized. Right ventricular systolic function was not well visualized. There is severely elevated pulmonary artery systolic pressure. The tricuspid  regurgitant velocity is 4.20 m/s, and with an assumed right  atrial pressure of 15 mmHg, the estimated right ventricular systolic pressure is 66.0 mmHg. Left Atrium: Left atrial size was mildly dilated. Right Atrium: Right atrial size was not well visualized. Pericardium: There is no evidence of pericardial effusion. Mitral Valve: The mitral valve is abnormal. There is moderate thickening of the mitral valve leaflet(s). There is moderate calcification of the mitral valve leaflet(s). Moderate mitral annular calcification. Mild mitral valve regurgitation. No evidence of mitral valve stenosis. MV peak gradient, 10.6 mmHg. The mean mitral valve gradient is 3.0 mmHg. Tricuspid Valve: Moderate to severe TR, hepatic systolic flow reversal would suggest severe TR. The tricuspid valve is not well visualized. Tricuspid valve regurgitation is moderate . 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 Aortic Valve: The aortic valve is tricuspid. There is moderate calcification of the aortic valve. There is moderate thickening of the aortic valve. There is moderate aortic valve annular calcification. Aortic valve regurgitation is not visualized. No aortic stenosis is present. Aortic valve mean gradient measures 4.5 mmHg. Aortic valve peak gradient measures 8.8 mmHg. Aortic valve area, by VTI measures 2.74 cm. Pulmonic Valve: The pulmonic valve was abnormal. Pulmonic valve regurgitation is mild. No evidence of pulmonic stenosis. Aorta: The aortic root is normal in size and structure. Venous: The inferior vena cava is dilated in size with less than 50% respiratory variability, suggesting right atrial pressure of 15 mmHg. IAS/Shunts: No atrial level shunt detected by color flow Doppler.  LEFT VENTRICLE PLAX 2D LVIDd:         4.70 cm LVIDs:         2.80 cm LV PW:         1.00 cm LV IVS:        0.80 cm LVOT diam:     1.90 cm LV SV:         101 LV SV Index:   54 LVOT Area:     2.84 cm  RIGHT VENTRICLE RV Basal diam:  4.60 cm RV Mid diam:     3.90 cm LEFT ATRIUM             Index        RIGHT ATRIUM           Index LA diam:        4.40 cm 2.35 cm/m   RA Area:     20.00 cm LA Vol (A2C):   64.7 ml 34.62 ml/m  RA Volume:   60.40 ml  32.32 ml/m LA Vol (A4C):   71.7 ml 38.36 ml/m LA Biplane Vol: 73.4 ml 39.27 ml/m  AORTIC VALVE  PULMONIC VALVE AV Area (Vmax):    2.62 cm     PV Vmax:       1.14 m/s AV Area (Vmean):   2.61 cm     PV Peak grad:  5.2 mmHg AV Area (VTI):     2.74 cm AV Vmax:           148.50 cm/s AV Vmean:          98.750 cm/s AV VTI:            0.370 m AV Peak Grad:      8.8 mmHg AV Mean Grad:      4.5 mmHg LVOT Vmax:         137.00 cm/s LVOT Vmean:        90.800 cm/s LVOT VTI:          0.357 m LVOT/AV VTI ratio: 0.96  AORTA Ao Root diam: 2.50 cm MITRAL VALVE                TRICUSPID VALVE MV Area (PHT): 4.12 cm     TR Peak grad:   70.6 mmHg MV Area VTI:   1.95 cm     TR Vmax:        420.00 cm/s MV Peak grad:  10.6 mmHg MV Mean grad:  3.0 mmHg     SHUNTS MV Vmax:       1.63 m/s     Systemic VTI:  0.36 m MV Vmean:      71.4 cm/s    Systemic Diam: 1.90 cm MV Decel Time: 184 msec MR Peak grad: 66.9 mmHg MR Mean grad: 39.0 mmHg MR Vmax:      409.00 cm/s MR Vmean:     297.0 cm/s MV E velocity: 162.00 cm/s Carlyle Dolly MD Electronically signed by Carlyle Dolly MD Signature Date/Time: 02/25/2022/4:55:05 PM    Final    US Carotid Bilateral  Result Date: 02/25/2022 CLINICAL DATA:  Syncope Hypertension Hyperlipidemia Diabetes Remote tobacco use history EXAM: BILATERAL CAROTID DUPLEX ULTRASOUND TECHNIQUE: Pearline Cables scale imaging, color Doppler and duplex ultrasound were performed of bilateral carotid and vertebral arteries in the neck. COMPARISON:  None available FINDINGS: Criteria: Quantification of carotid stenosis is based on velocity parameters that correlate the residual internal carotid diameter with NASCET-based stenosis levels, using the diameter of the distal internal carotid lumen as the denominator for stenosis  measurement. The following velocity measurements were obtained: RIGHT ICA: 117/24 cm/sec CCA: 71/06 cm/sec SYSTOLIC ICA/CCA RATIO:  1.2 ECA: 241 cm/sec LEFT ICA: 244/46 cm/sec CCA: 26/94 cm/sec SYSTOLIC ICA/CCA RATIO:  3.0 ECA: 100 cm/sec RIGHT CAROTID ARTERY: Mild to moderate shadowing calcified plaque noted at the carotid bifurcation. RIGHT VERTEBRAL ARTERY:  Antegrade flow. LEFT CAROTID ARTERY: Moderate shadowing calcified plaque of the carotid bifurcation. LEFT VERTEBRAL ARTERY:  Antegrade flow. IMPRESSION: 1. 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 2. Less than 50% stenosis of the right internal carotid artery. Electronically Signed   By: Miachel Roux M.D.   On: 02/25/2022 09:31   DG Chest Port 1 View  Result Date: 02/25/2022 CLINICAL DATA:  Syncope. EXAM: PORTABLE CHEST 1 VIEW COMPARISON:  October 21, 2021 FINDINGS: Multiple sternal wires and vascular clips are noted. The cardiac silhouette is mildly enlarged and unchanged in size. There is stable mild to moderate severity elevation of the right hemidiaphragm. Chronic appearing increased lung markings are seen without evidence of an acute infiltrate, pleural  effusion or pneumothorax. Multilevel degenerative changes are seen throughout the thoracic spine. IMPRESSION: 1. Evidence of prior median sternotomy/CABG. 2. Chronic appearing increased lung markings without acute cardiopulmonary disease. Electronically Signed   By: Virgina Norfolk M.D.   On: 02/25/2022 02:40    Assessment  76 y.o. female with a history of CAD s/p CABG in 2019, heart failure, diabetes, hyperlipidemia, lung cancer in 2021 s/p right upper lobectomy, history of PE, COPD, pulmonary hypertension who presented to the ED with syncopal episode.  Reported shortness of breath and dyspnea on exertion for several months, worsening over the past few weeks.  Admitted with acute blood loss  anemia and heme positive stool, acute on chronic heart failure, hyperkalemia, and acute on chronic renal failure.  GI consulted for further evaluation management of anemia.  Acute blood loss anemia: Hemoglobin 7.4 on admission with last hemoglobin 10.7 in August 2023.  Received 2 units PRBCs this admission.  Baseline appears to be 9/10 range.  Stool is Hemoccult positive.  Denies any further melena during hospitalization.  EGD, colonoscopy, and capsule endoscopy have been performed.  EGD normal other than 3 cm hiatal hernia.  Colonoscopy with markedly redundant colon, unable to intubate TI, 6 mm sessile polyp in descending colon.  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  Hemoglobin stable today at 9.1.  CTE has been ordered, not yet performed.  Per nursing, patient refused to lay flat after drinking barium yesterday.  Per discussion with patient this morning, she is agreeable to proceed with CTE, working on coordination with nursing and radiology regarding timing for CTE.  Plan / Recommendations  Follow-up CT enterography  Monitor for overt GI bleeding Continue PPI twice daily Continue monitor H/H, transfuse Rehman globin less than 7 Able to have diet after CTE performed, if not tonight may have clears and NPO at midnight.    LOS: 3 days    03/01/2022, 12:49 PM   Venetia Night, MSN, FNP-BC, AGACNP-BC [facility]

## 2022-03-02 ENCOUNTER — Inpatient Hospital Stay (HOSPITAL_COMMUNITY): Payer: PPO

## 2022-03-02 DIAGNOSIS — R55 Syncope and collapse: Secondary | ICD-10-CM | POA: Diagnosis not present

## 2022-03-02 LAB — CBC
HCT: 30.1 % — ABNORMAL LOW (ref 36.0–46.0)
Hemoglobin: 9.1 g/dL — ABNORMAL LOW (ref 12.0–15.0)
MCH: 29.6 pg (ref 26.0–34.0)
MCHC: 30.2 g/dL (ref 30.0–36.0)
MCV: 98 fL (ref 80.0–100.0)
Platelets: 196 10*3/uL (ref 150–400)
RBC: 3.07 MIL/uL — ABNORMAL LOW (ref 3.87–5.11)
RDW: 16.4 % — ABNORMAL HIGH (ref 11.5–15.5)
WBC: 6.1 10*3/uL (ref 4.0–10.5)
nRBC: 0 % (ref 0.0–0.2)

## 2022-03-02 LAB — BASIC METABOLIC PANEL
Anion gap: 9 (ref 5–15)
BUN: 13 mg/dL (ref 8–23)
CO2: 34 mmol/L — ABNORMAL HIGH (ref 22–32)
Calcium: 9 mg/dL (ref 8.9–10.3)
Chloride: 97 mmol/L — ABNORMAL LOW (ref 98–111)
Creatinine, Ser: 1.02 mg/dL — ABNORMAL HIGH (ref 0.44–1.00)
GFR, Estimated: 56 mL/min — ABNORMAL LOW (ref >60)
Glucose, Bld: 92 mg/dL (ref 70–99)
Potassium: 3.9 mmol/L (ref 3.5–5.1)
Sodium: 140 mmol/L (ref 135–145)

## 2022-03-02 LAB — GLUCOSE, CAPILLARY
Glucose-Capillary: 88 mg/dL (ref 70–99)
Glucose-Capillary: 135 mg/dL — ABNORMAL HIGH (ref 70–99)
Glucose-Capillary: 171 mg/dL — ABNORMAL HIGH (ref 70–99)
Glucose-Capillary: 132 mg/dL — ABNORMAL HIGH (ref 70–99)

## 2022-03-02 LAB — PROCALCITONIN: Procalcitonin: 0.2 ng/mL

## 2022-03-02 NOTE — Progress Notes (Signed)
PROGRESS NOTE  Karen Tate LFY:[id number] DOB: [birth year] DOA: 02/24/2022 PCP: Neale Burly, MD  Brief History:  76 year old female with a history of coronary artery disease status post CABG 2019, HFpEF, diabetes mellitus type 2, hyperlipidemia, postoperative atrial fibrillation, non-small cell lung cancer status post lobectomy 05/13/2019, COPD, chronic respiratory failure on 2 L and pulmonary hypertension presenting with a syncopal episode.  The patient spouse was helping the patient get back into the bedroom when she had a syncopal episode lasting about 2 minutes.  There is no postictal symptoms.  There is no tonic-clonic activity.  However, the patient states that she has had increasing shortness of breath and dyspnea on exertion for the past 2 months, worst in the past 2 to 3 weeks.  She denies any chest pain, coughing, hemoptysis.  She has not had any fevers, chills, nausea, vomiting, diarrhea, but she states that she has been having some melanotic stools. In the ED, the patient was afebrile and hemodynamically stable with oxygen saturation 100% on room 2 L.  WBC 6.5, hemoglobin 7.4, platelets 255,000.  FOBT was positive. Sodium 140, potassium 5.7, bicarbonate 31, serum creatinine 2.09.  Chest x-ray showed increased interstitial markings. Cardiology and GI were consulted to assist with management.   Assessment/Plan: Syncope -Likely secondary to blood loss anemia with drop in hemoglobin from 10.7 (11/07/21)>> 7.4>>9.3 -EEG--neg for seizure -Cardiology consult appreciated -Remain on telemetry>>afib with SVR -07/21/2020 echo EF 60 to 65%,+WMX, grade 2 DD, RVSP 61.3, moderate TR/MR -02/25/22 Echo EF 65-70%, no WMA, RV overload, mod-severe TR  Melena/Acute Blood Loss Anemia -GI consult appreciated and planning endoscopy completed 12/6 -Givens capsule given 12/6 per GI service  -Hgb 10.7 (11/07/21)>>7.4>>6.7 -2 units PRBC given, Hg now 9.2 -continue protonix bid -12/5 EGD--normal  stomach/duodenum, 3 cm hiatus hernia -completed colonoscopy 12/6; no source of bleeding found -Givens capsule given 12/6 results pending  -continue pantoprazole IV bid  GAD  - with benzo dependence - can get severe at times with panic symptoms - added prn IV lorazepam as needed - resume home daily alprazolam   Acute on chronic diastolic CHF -The patient does have some signs of fluid overload -Cardiology following -IV furosemide given by cardiology 12/7 and 12/8 -I gave an extra IV furosemide dose on 12/7  -07/21/2020 echo EF 60 to 65%, +WMA, grade 2 DD, moderate TR, RSVP 61.3 -02/25/22 Echo EF 65-70%, no WMA, RV overload, mod-severe TR  Intake/Output Summary (Last 24 hours) at 03/02/2022 1304 Last data filed at 03/02/2022 0900 Gross per 24 hour  Intake 240 ml  Output 1501 ml  Net -1261 ml    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 -Baseline creatinine 1.0-1.3 -Patient presented with serum creatinine 2.09 -Monitor with diuresis -Hold lisinopril   Hyperkalemia -Presented with serum potassium 5.7 -Lokelma given -treated and resolved (4.5)  Afib, type unspecified -SVR--HR 40-60 -holding coreg due to severe bradycardia  -not a candidate for [facility] due to GI bleed  Carotid stenosis -has had known stenosis -not a candidate for operative intervention given age, frailty, poor baseline function -R-ICA <50%, Left ICA 70%   Diabetes mellitus type 2 with hyperglycemia -11/12/2021 hemoglobin A1c 7.5 -NovoLog sliding scale -Holding metformin CBG (last 3)  Recent Labs    03/01/22 2044 03/02/22 0714 03/02/22 1131  GLUCAP 89 88 135*    Essential hypertension -Holding carvedilol secondary bradycardia -Hold lisinopril secondary to hyperkalemia and renal failure -resuming amlodipine 5 mg daily  -BPs will be  monitored    COPD -Stable without exacerbation -Continue Advair   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 Delton Coombes   Mixed  Hyperlipidemia -continue statin   Pulmonary HTN -- Most likely secondary to her chronic O2 dependent lung disease, left sided heart diseas  -- no further testing at this time per cardiology given pt's frailty and co-morbidities --IV lasix given on 12/7, 12/8 with plan to resume home oral lasix 12/9     Family Communication:   spouse and daughter updated at bedside    Consultants:  GI, cardiology   Code Status:  FULL    DVT Prophylaxis:  SCDs     Procedures: As Listed in Progress Note Above   Antibiotics: None  Subjective: Pt says she is breathing better today.        Objective: Vitals:   03/02/22 0534 03/02/22 0616 03/02/22 0925 03/02/22 1212  BP: 138/62   125/61  Pulse: 92   86  Resp: 18   16  Temp: 98.6 F (37 C)   98.1 F (36.7 C)  TempSrc: Oral     SpO2: 94%  90% 97%  Weight:  75.6 kg    Height:        Intake/Output Summary (Last 24 hours) at 03/02/2022 1302 Last data filed at 03/02/2022 0900 Gross per 24 hour  Intake 240 ml  Output 1501 ml  Net -1261 ml   Weight change: -5 kg Exam:  General:  Pt is alert, follows commands appropriately, not in acute distress HEENT: mild JVD; No icterus, No thrush, No neck mass, [place]/AT Cardiovascular: normal S1/S2 sounds, no rubs, no gallops Respiratory: good air movement, no increased work of breathing. No wheeze Abdomen: Soft/+BS, non tender, non distended, no guarding Extremities: trace LE edema persists, No lymphangitis, No petechiae, No rashes, no synovitis  Data Reviewed: I have personally reviewed following labs and imaging studies Basic Metabolic Panel: Recent Labs  Lab 02/26/22 0255 02/26/22 0614 02/27/22 0557 02/27/22 0558 02/28/22 0557 03/01/22 0509 03/02/22 0405  NA 139  --  138  --  141 141 140  K 4.8   < > 4.4 4.5 4.5 4.7 3.9  CL 100  --  102  --  104 100 97*  CO2 31  --  29  --  30 33* 34*  GLUCOSE 83  --  85  --  74 88 92  BUN 32*  --  23  --  18 17 13   CREATININE 1.83*  --  1.41*  --  1.25*  1.20* 1.02*  CALCIUM 8.5*  --  8.5*  --  8.9 9.1 9.0  MG 2.2  --  2.0  --  2.0 1.8  --   PHOS 4.5  --   --   --   --   --   --    < > = values in this interval not displayed.   Liver Function Tests: Recent Labs  Lab 02/26/22 0255  AST 15  ALT 11  ALKPHOS 47  BILITOT 0.9  PROT 6.3*  ALBUMIN 2.7*   No results for input(s): "LIPASE", "AMYLASE" in the last 168 hours. No results for input(s): "AMMONIA" in the last 168 hours. Coagulation Profile: No results for input(s): "INR", "PROTIME" in the last 168 hours. CBC: Recent Labs  Lab 02/26/22 0255 02/26/22 0614 02/27/22 0557 02/28/22 0557 03/01/22 0509 03/02/22 0405  WBC 4.2  --  5.4 6.8 5.9 6.1  HGB 6.7* 6.7* 9.2* 9.3* 9.1* 9.1*  HCT 22.3* 22.0* 30.5* 30.8* 30.6* 30.1*  MCV 97.4  --  96.8 97.8 98.7 98.0  PLT 217  --  191 203 181 196   Cardiac Enzymes: No results for input(s): "CKTOTAL", "CKMB", "CKMBINDEX", "TROPONINI" in the last 168 hours. BNP: Invalid input(s): "POCBNP" CBG: Recent Labs  Lab 03/01/22 1106 03/01/22 1624 03/01/22 2044 03/02/22 0714 03/02/22 1131  GLUCAP 86 82 89 88 135*   HbA1C: No results for input(s): "HGBA1C" in the last 72 hours.  Urine analysis:    Component Value Date/Time   COLORURINE YELLOW 02/26/2022 [place] 02/26/2022 0519   LABSPEC 1.013 02/26/2022 0519   PHURINE 5.0 02/26/2022 0519   GLUCOSEU NEGATIVE 02/26/2022 0519   HGBUR NEGATIVE 02/26/2022 0519   BILIRUBINUR NEGATIVE 02/26/2022 0519   KETONESUR NEGATIVE 02/26/2022 0519   PROTEINUR 30 (A) 02/26/2022 0519   NITRITE NEGATIVE 02/26/2022 0519   LEUKOCYTESUR TRACE (A) 02/26/2022 0519    Recent Results (from the past 240 hour(s))  Urine Culture     Status: Abnormal   Collection Time: 02/26/22  5:19 AM   Specimen: Urine, Clean Catch  Result Value Ref Range Status   Specimen Description   Final    URINE, CLEAN CATCH Performed at [facility], [address] [place], [place] [address]    Special Requests    Final    NONE Performed at [facility], [address] [place], [place] [address]    Culture (A)  Final    20,000 COLONIES/mL GROUP B STREP(S.AGALACTIAE)ISOLATED TESTING AGAINST S. AGALACTIAE NOT ROUTINELY PERFORMED DUE TO PREDICTABILITY OF AMP/PEN/VAN SUSCEPTIBILITY. Performed at [facility], [place] [address] [place], [place] [address]    Report Status 02/27/2022 FINAL  Final     Scheduled Meds:  sodium chloride   Intravenous Once   ALPRAZolam  0.5 mg Oral BID   amLODipine  5 mg Oral Daily   aspirin EC  81 mg Oral Daily   atorvastatin  80 mg Oral q1800   furosemide  40 mg Oral Daily   insulin aspart  0-5 Units Subcutaneous QHS   insulin aspart  0-9 Units Subcutaneous TID WC   mometasone-formoterol  2 puff Inhalation BID   pantoprazole (PROTONIX) IV  40 mg Intravenous Q12H   Continuous Infusions:  Procedures/Studies: DG CHEST PORT 1 VIEW  Result Date: 03/02/2022 CLINICAL DATA:  Cough, shortness of breath EXAM: PORTABLE CHEST 1 VIEW COMPARISON:  02/28/2022 FINDINGS: Prior CABG. Cardiomegaly, vascular congestion. Stable diffuse interstitial prominence. No visible effusions or pneumothorax. No acute bony abnormality. IMPRESSION: Cardiomegaly with vascular congestion. Stable interstitial prominence which could reflect chronic lung disease or edema. Electronically Signed   By: Rolm Baptise M.D.   On: 03/02/2022 09:42   CT ENTERO ABD/PELVIS W CONTAST  Result Date: 03/01/2022 CLINICAL DATA:  GI bleed, loss of consciousness, hemoccult stool positive, lung cancer * Tracking Code: BO * EXAM: CT ABDOMEN AND PELVIS WITH CONTRAST (ENTEROGRAPHY) TECHNIQUE: Multidetector CT of the abdomen and pelvis during bolus administration of intravenous contrast. Negative oral contrast was given. RADIATION DOSE REDUCTION: This exam was performed according to the departmental dose-optimization program which includes automated exposure control, adjustment of the mA and/or kV according to patient size  and/or use of iterative reconstruction technique. CONTRAST:  24mL OMNIPAQUE IOHEXOL 300 MG/ML  SOLN COMPARISON:  CT chest, 11/07/2021, PET-CT, 04/27/2019 FINDINGS: Lower chest: Cardiomegaly and three-vessel coronary artery calcifications status post median sternotomy and CABG. Small bilateral pleural effusions and associated atelectasis or consolidation, increased compared  to prior examination. Extensive heterogeneous and consolidative airspace opacity throughout the lung bases, with evidence of prior right lower lobe wedge resection. Hepatobiliary: No solid liver abnormality is seen. No gallstones, gallbladder wall thickening, or biliary dilatation. Pancreas: Unremarkable. No pancreatic ductal dilatation or surrounding inflammatory changes. Spleen: Normal in size without significant abnormality. Adrenals/Urinary Tract: Adrenal glands are unremarkable. Bilateral renal vascular calcifications. Kidneys are normal, without renal calculi, solid lesion, or hydronephrosis. Bladder is unremarkable. Stomach/Bowel: Stomach is within normal limits. Appendix is not clearly visualized and may be surgically absent. Small bowel is underdistended by negative oral enteric contrast. No obvious mass, inflammatory findings, or other abnormality of the small bowel. Colon is fluid-filled to the rectum. Metallic foreign body in the posterior distal rectum (series 2, image 82). Vascular/Lymphatic: Aortic atherosclerosis. No enlarged abdominal or pelvic lymph nodes. Reproductive: Status post hysterectomy. Other: Midline ventral hernia mesh repair.  Anasarca.  No ascites. Musculoskeletal: No acute or significant osseous findings. IMPRESSION: 1. Small bowel is underdistended by negative oral enteric contrast. No obvious mass, inflammatory findings, or other abnormality of the small bowel. 2. Colon is fluid-filled to the rectum, in keeping with diarrheal illness. 3. Metallic foreign body in the posterior distal rectum, of uncertain nature,  possibly endoscopic capsule. 4. Small bilateral pleural effusions and associated atelectasis or consolidation, increased compared to prior examination. 5. Extensive heterogeneous and consolidative airspace opacity throughout the lung consistent with infection or inflammation. 6. No evidence of lymphadenopathy or metastatic disease in the abdomen or pelvis. 7. Cardiomegaly and coronary artery disease. 8. Anasarca. Aortic Atherosclerosis (ICD10-I70.0). Electronically Signed   By: Delanna Ahmadi M.D.   On: 03/01/2022 18:29   DG CHEST PORT 1 VIEW  Result Date: 02/28/2022 CLINICAL DATA:  Shortness of breath. EXAM: PORTABLE CHEST 1 VIEW COMPARISON:  February 25, 2022. FINDINGS: Stable cardiomediastinal silhouette. Status post coronary bypass graft. Stable bibasilar interstitial lung opacities are noted which may represent scarring, but acute superimposed edema cannot be excluded. Small right pleural effusion is noted. Bony thorax is unremarkable. IMPRESSION: Stable bibasilar interstitial densities are noted which may represent scarring, but acute superimposed edema cannot be excluded. Small right pleural effusion. Electronically Signed   By: Marijo Conception M.D.   On: 02/28/2022 11:38   EEG adult  Result Date: 02/25/2022 Spero Curb, MD     02/25/2022  9:29 PM [facility] Routine EEG Report Video Performed: Performed Demographics: Patient Name:   Lashell, Moffitt Date of Birth:   [birth year] Identification Number:   MRN - [id number] Study Times: Study Start Time:   02/25/2022 13:33:00 Study End Time:   02/25/2022 14:16:00 Indication(s): Syncope Medication: Xanax Technical Summary: This EEG was performed utilizing standard International 10-20 System of electrode placement. One channel electrocardiogram was monitored. Data were obtained, stored, and interpreted utilizing referential montage recording, with reformatting to longitudinal, transverse bipolar, and  referential montages as necessary for interpretation. State(s):       Awake      Drowsy Activation Procedures: Hyperventilation: Not performed Photic Stimulation: Not performed EEG Description: During wakefulness, the background showed a continuous 8 Hz posterior dominant alpha rhythm which is fairly modulated, symmetrical and reactive to eye opening.  Drowsiness demonstrated a background attenuation. Sleep stage II was not reached.  There was no clinical event noted.  Throughout the record, there was no epileptiform abnormality or lateralizing sign observed.  Intermittent myogenic and movement artifacts were noted.   Impression: This is a normal awake and drowsy EEG. No epileptiform abnormality or  lateralizing sign is observed.  Note that an absence of epileptiform abnormality does not exclude nor support the diagnosis of epilepsy. Dr Spero Curb [facility] For Inpatient follow-up with [facility] physician please call [facility] [phone]. This is not an outpatient service. Post hospital discharge, please contact hospital directly.   ECHOCARDIOGRAM COMPLETE  Result Date: 02/25/2022    ECHOCARDIOGRAM REPORT   Patient Name:   MATISON NUCCIO Date of Exam: 02/25/2022 Medical Rec #:  [id number]    Height:       65.0 in Accession #:    [id number]   Weight:       175.0 lb Date of Birth:  [birth year]    BSA:          1.869 m Patient Age:    61 years     BP:           116/55 mmHg Patient Gender: F            HR:           57 bpm. Exam Location:  [facility] Procedure: 2D Echo, Cardiac Doppler and Color Doppler Indications:    Syncope  History:        Patient has prior history of Echocardiogram examinations, most                 recent 07/21/2020. CHF, Previous Myocardial Infarction and CAD,                 Prior CABG, Signs/Symptoms:Chest Pain and Syncope; Risk                 Factors:Hypertension, Diabetes and Dyslipidemia.  Sonographer:    Wenda Low Referring Phys: [id number] OLADAPO ADEFESO IMPRESSIONS  1.  Left ventricular ejection fraction, by estimation, is 65 to 70%. The left ventricle has normal function. The left ventricle has no regional wall motion abnormalities. Left ventricular diastolic parameters are indeterminate.  2. Ventricular septum is flattnened in systole and diastole suggesting RV pressure and volume overload. RV not well visualized, grossly appears enlarged with decreased systolic function. . Right ventricular systolic function was not well visualized. The  right ventricular size is not well visualized. There is severely elevated pulmonary artery systolic pressure.  3. Left atrial size was mildly dilated.  4. The mitral valve is abnormal. Mild mitral valve regurgitation. No evidence of mitral stenosis. Moderate mitral annular calcification.  5. Moderate to severe TR, hepatic systolic flow reversal would suggest severe TR. . Tricuspid valve regurgitation is moderate. 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  6. The aortic valve is tricuspid. There is moderate calcification of the aortic valve. There is moderate thickening of the aortic valve. Aortic valve regurgitation is not visualized. No aortic stenosis is present.  7. The pulmonic valve was abnormal.  8. The inferior vena cava is dilated in size with <50% respiratory variability, suggesting right atrial pressure of 15 mmHg. FINDINGS  Left Ventricle: Left ventricular ejection fraction, by estimation, is 65 to 70%. The left ventricle has normal function. The left ventricle has no regional wall motion abnormalities. The left ventricular internal cavity size was normal in size. There is  no left ventricular hypertrophy. Left ventricular diastolic parameters are indeterminate. Right Ventricle: Ventricular septum is flattnened in systole and diastole suggesting RV pressure and volume overload. RV not well visualized, grossly appears enlarged with decreased systolic function. The right ventricular size is not well visualized. Right vetricular wall  thickness was not well visualized. Right ventricular systolic function was not well visualized.  There is severely elevated pulmonary artery systolic pressure. The tricuspid regurgitant velocity is 4.20 m/s, and with an assumed right  atrial pressure of 15 mmHg, the estimated right ventricular systolic pressure is 96.7 mmHg. Left Atrium: Left atrial size was mildly dilated. Right Atrium: Right atrial size was not well visualized. Pericardium: There is no evidence of pericardial effusion. Mitral Valve: The mitral valve is abnormal. There is moderate thickening of the mitral valve leaflet(s). There is moderate calcification of the mitral valve leaflet(s). Moderate mitral annular calcification. Mild mitral valve regurgitation. No evidence of mitral valve stenosis. MV peak gradient, 10.6 mmHg. The mean mitral valve gradient is 3.0 mmHg. Tricuspid Valve: Moderate to severe TR, hepatic systolic flow reversal would suggest severe TR. The tricuspid valve is not well visualized. Tricuspid valve regurgitation is moderate . 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 Aortic Valve: The aortic valve is tricuspid. There is moderate calcification of the aortic valve. There is moderate thickening of the aortic valve. There is moderate aortic valve annular calcification. Aortic valve regurgitation is not visualized. No aortic stenosis is present. Aortic valve mean gradient measures 4.5 mmHg. Aortic valve peak gradient measures 8.8 mmHg. Aortic valve area, by VTI measures 2.74 cm. Pulmonic Valve: The pulmonic valve was abnormal. Pulmonic valve regurgitation is mild. No evidence of pulmonic stenosis. Aorta: The aortic root is normal in size and structure. Venous: The inferior vena cava is dilated in size with less than 50% respiratory variability, suggesting right atrial pressure of 15 mmHg. IAS/Shunts: No atrial level shunt detected by color flow Doppler.  LEFT VENTRICLE PLAX 2D LVIDd:         4.70 cm LVIDs:         2.80 cm LV PW:          1.00 cm LV IVS:        0.80 cm LVOT diam:     1.90 cm LV SV:         101 LV SV Index:   54 LVOT Area:     2.84 cm  RIGHT VENTRICLE RV Basal diam:  4.60 cm RV Mid diam:    3.90 cm LEFT ATRIUM             Index        RIGHT ATRIUM           Index LA diam:        4.40 cm 2.35 cm/m   RA Area:     20.00 cm LA Vol (A2C):   64.7 ml 34.62 ml/m  RA Volume:   60.40 ml  32.32 ml/m LA Vol (A4C):   71.7 ml 38.36 ml/m LA Biplane Vol: 73.4 ml 39.27 ml/m  AORTIC VALVE                    PULMONIC VALVE AV Area (Vmax):    2.62 cm     PV Vmax:       1.14 m/s AV Area (Vmean):   2.61 cm     PV Peak grad:  5.2 mmHg AV Area (VTI):     2.74 cm AV Vmax:           148.50 cm/s AV Vmean:          98.750 cm/s AV VTI:            0.370 m AV Peak Grad:      8.8 mmHg AV Mean Grad:      4.5 mmHg LVOT Vmax:  137.00 cm/s LVOT Vmean:        90.800 cm/s LVOT VTI:          0.357 m LVOT/AV VTI ratio: 0.96  AORTA Ao Root diam: 2.50 cm MITRAL VALVE                TRICUSPID VALVE MV Area (PHT): 4.12 cm     TR Peak grad:   70.6 mmHg MV Area VTI:   1.95 cm     TR Vmax:        420.00 cm/s MV Peak grad:  10.6 mmHg MV Mean grad:  3.0 mmHg     SHUNTS MV Vmax:       1.63 m/s     Systemic VTI:  0.36 m MV Vmean:      71.4 cm/s    Systemic Diam: 1.90 cm MV Decel Time: 184 msec MR Peak grad: 66.9 mmHg MR Mean grad: 39.0 mmHg MR Vmax:      409.00 cm/s MR Vmean:     297.0 cm/s MV E velocity: 162.00 cm/s Carlyle Dolly MD Electronically signed by Carlyle Dolly MD Signature Date/Time: 02/25/2022/4:55:05 PM    Final    US Carotid Bilateral  Result Date: 02/25/2022 CLINICAL DATA:  Syncope Hypertension Hyperlipidemia Diabetes Remote tobacco use history EXAM: BILATERAL CAROTID DUPLEX ULTRASOUND TECHNIQUE: Pearline Cables scale imaging, color Doppler and duplex ultrasound were performed of bilateral carotid and vertebral arteries in the neck. COMPARISON:  None available FINDINGS: Criteria: Quantification of carotid stenosis is based on velocity parameters that  correlate the residual internal carotid diameter with NASCET-based stenosis levels, using the diameter of the distal internal carotid lumen as the denominator for stenosis measurement. The following velocity measurements were obtained: RIGHT ICA: 117/24 cm/sec CCA: 77/93 cm/sec SYSTOLIC ICA/CCA RATIO:  1.2 ECA: 241 cm/sec LEFT ICA: 244/46 cm/sec CCA: 90/30 cm/sec SYSTOLIC ICA/CCA RATIO:  3.0 ECA: 100 cm/sec RIGHT CAROTID ARTERY: Mild to moderate shadowing calcified plaque noted at the carotid bifurcation. RIGHT VERTEBRAL ARTERY:  Antegrade flow. LEFT CAROTID ARTERY: Moderate shadowing calcified plaque of the carotid bifurcation. LEFT VERTEBRAL ARTERY:  Antegrade flow. IMPRESSION: 1. 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 2. Less than 50% stenosis of the right internal carotid artery. Electronically Signed   By: Miachel Roux M.D.   On: 02/25/2022 09:31   DG Chest Port 1 View  Result Date: 02/25/2022 CLINICAL DATA:  Syncope. EXAM: PORTABLE CHEST 1 VIEW COMPARISON:  October 21, 2021 FINDINGS: Multiple sternal wires and vascular clips are noted. The cardiac silhouette is mildly enlarged and unchanged in size. There is stable mild to moderate severity elevation of the right hemidiaphragm. Chronic appearing increased lung markings are seen without evidence of an acute infiltrate, pleural effusion or pneumothorax. Multilevel degenerative changes are seen throughout the thoracic spine. IMPRESSION: 1. Evidence of prior median sternotomy/CABG. 2. Chronic appearing increased lung markings without acute cardiopulmonary disease. Electronically Signed   By: Virgina Norfolk M.D.   On: 02/25/2022 02:40    Irwin Brakeman, MD  [facility]  If 7PM-7AM, please contact night-coverage [email or web address] Password [email or web address] 03/02/2022, 1:02 PM   LOS: 4 days

## 2022-03-02 NOTE — Plan of Care (Signed)

## 2022-03-02 NOTE — Progress Notes (Signed)
Patient without complaints; specifically denies melena, abdominal pain, nausea or vomiting. CTE negative for small bowel tumor.   Video capsule apparent in the rectum.  Hemoglobin stable.  Vital signs in last 24 hours: Temp:  [98.1 F (36.7 C)-99.3 F (37.4 C)] 98.1 F (36.7 C) (12/09 1212) Pulse Rate:  [86-95] 86 (12/09 1212) Resp:  [16-18] 16 (12/09 1212) BP: (125-141)/(54-62) 125/61 (12/09 1212) SpO2:  [90 %-97 %] 97 % (12/09 1212) Weight:  [75.6 kg] 75.6 kg (12/09 0616) Last BM Date : 03/01/22 General:     Elderly, frail lady who is alert and conversant. pleasant and cooperative in NAD;    She is accompanied by her husband Shireen Rayburn and her grandson. Abdomen:  Soft, nontender and nondistended.  Normal bowel sounds, without guarding, and without rebound.  No mass or organomegaly. Extremities:  Without clubbing or edema.    Intake/Output from previous day: 12/08 0701 - 12/09 0700 In: 0  Out: 1501 [Urine:1501] Intake/Output this shift: Total I/O In: 240 [P.O.:240] Out: -   Lab Results: Recent Labs    02/28/22 0557 03/01/22 0509 03/02/22 0405  WBC 6.8 5.9 6.1  HGB 9.3* 9.1* 9.1*  HCT 30.8* 30.6* 30.1*  PLT 203 181 196   BMET Recent Labs    02/28/22 0557 03/01/22 0509 03/02/22 0405  NA 141 141 140  K 4.5 4.7 3.9  CL 104 100 97*  CO2 30 33* 34*  GLUCOSE 74 88 92  BUN 18 17 13   CREATININE 1.25* 1.20* 1.02*  CALCIUM 8.9 9.1 9.0   LFT No results for input(s): "PROT", "ALBUMIN", "AST", "ALT", "ALKPHOS", "BILITOT", "BILIDIR", "IBILI" in the last 72 hours. PT/INR No results for input(s): "LABPROT", "INR" in the last 72 hours. Hepatitis Panel No results for input(s): "HEPBSAG", "HCVAB", "HEPAIGM", "HEPBIGM" in the last 72 hours. C-Diff No results for input(s): "CDIFFTOX" in the last 72 hours.  Studies/Results: DG CHEST PORT 1 VIEW  Result Date: 03/02/2022 CLINICAL DATA:  Cough, shortness of breath EXAM: PORTABLE CHEST 1 VIEW COMPARISON:  02/28/2022  FINDINGS: Prior CABG. Cardiomegaly, vascular congestion. Stable diffuse interstitial prominence. No visible effusions or pneumothorax. No acute bony abnormality. IMPRESSION: Cardiomegaly with vascular congestion. Stable interstitial prominence which could reflect chronic lung disease or edema. Electronically Signed   By: Rolm Baptise M.D.   On: 03/02/2022 09:42   CT ENTERO ABD/PELVIS W CONTAST  Result Date: 03/01/2022 CLINICAL DATA:  GI bleed, loss of consciousness, hemoccult stool positive, lung cancer * Tracking Code: BO * EXAM: CT ABDOMEN AND PELVIS WITH CONTRAST (ENTEROGRAPHY) TECHNIQUE: Multidetector CT of the abdomen and pelvis during bolus administration of intravenous contrast. Negative oral contrast was given. RADIATION DOSE REDUCTION: This exam was performed according to the departmental dose-optimization program which includes automated exposure control, adjustment of the mA and/or kV according to patient size and/or use of iterative reconstruction technique. CONTRAST:  36mL OMNIPAQUE IOHEXOL 300 MG/ML  SOLN COMPARISON:  CT chest, 11/07/2021, PET-CT, 04/27/2019 FINDINGS: Lower chest: Cardiomegaly and three-vessel coronary artery calcifications status post median sternotomy and CABG. Small bilateral pleural effusions and associated atelectasis or consolidation, increased compared to prior examination. Extensive heterogeneous and consolidative airspace opacity throughout the lung bases, with evidence of prior right lower lobe wedge resection. Hepatobiliary: No solid liver abnormality is seen. No gallstones, gallbladder wall thickening, or biliary dilatation. Pancreas: Unremarkable. No pancreatic ductal dilatation or surrounding inflammatory changes. Spleen: Normal in size without significant abnormality. Adrenals/Urinary Tract: Adrenal glands are unremarkable. Bilateral renal vascular calcifications. Kidneys are normal, without renal  calculi, solid lesion, or hydronephrosis. Bladder is unremarkable.  Stomach/Bowel: Stomach is within normal limits. Appendix is not clearly visualized and may be surgically absent. Small bowel is underdistended by negative oral enteric contrast. No obvious mass, inflammatory findings, or other abnormality of the small bowel. Colon is fluid-filled to the rectum. Metallic foreign body in the posterior distal rectum (series 2, image 82). Vascular/Lymphatic: Aortic atherosclerosis. No enlarged abdominal or pelvic lymph nodes. Reproductive: Status post hysterectomy. Other: Midline ventral hernia mesh repair.  Anasarca.  No ascites. Musculoskeletal: No acute or significant osseous findings. IMPRESSION: 1. Small bowel is underdistended by negative oral enteric contrast. No obvious mass, inflammatory findings, or other abnormality of the small bowel. 2. Colon is fluid-filled to the rectum, in keeping with diarrheal illness. 3. Metallic foreign body in the posterior distal rectum, of uncertain nature, possibly endoscopic capsule. 4. Small bilateral pleural effusions and associated atelectasis or consolidation, increased compared to prior examination. 5. Extensive heterogeneous and consolidative airspace opacity throughout the lung consistent with infection or inflammation. 6. No evidence of lymphadenopathy or metastatic disease in the abdomen or pelvis. 7. Cardiomegaly and coronary artery disease. 8. Anasarca. Aortic Atherosclerosis (ICD10-I70.0). Electronically Signed   By: Delanna Ahmadi M.D.   On: 03/01/2022 18:29      Impression:   Frail 76 year old lady with numerous medical problems including CAD status post CABG heart failure diabetes hyperlipidemia history of lung cancer status post right upper lobe lobectomy history of PE COPD pulmonary hypertension   Chronic kidney disease  admitted to hospital with symptomatic anemia,  relatively acute decline in hemoglobin and positive stool.  She has remained stable over the past 3 days after receiving 2 units of packed RBCs on  admission.  EGD, colonoscopy, video capsule and  CTE performed.  Small hernia found on EGD; small adenoma removed from her colon.  Prominent vascular pattern small bowel without suspect bleeding lesion being seen on VCE.  CTE demonstrated no evidence of small bowel tumor.  I suspect she most likely has lost blood from her small bowel relating to a vascular lesion such as a Dieulafoy.  Recommendations:  At this time, I recommend she be advanced to a heart healthy diet  Continue daily PPI-once daily should suffice.  Follow H&H fairly closely over the next 1 to 2 weeks  May continue 81 mg aspirin daily as the benefits still appear to outweigh the risks.  It needs to be kept in mind that even a baby aspirin daily does increase the relative risk of GI bleeding.  However, if she presents with recurrent bleeding without a treatable lesion being found, would consider stopping antiplatelet therapy altogether.    My impression and recommendations have been discussed at length at the bedside with both the patient and her husband Karen Tate.

## 2022-03-03 DIAGNOSIS — D62 Acute posthemorrhagic anemia: Secondary | ICD-10-CM | POA: Diagnosis not present

## 2022-03-03 DIAGNOSIS — R55 Syncope and collapse: Secondary | ICD-10-CM | POA: Diagnosis not present

## 2022-03-03 DIAGNOSIS — K921 Melena: Secondary | ICD-10-CM | POA: Diagnosis not present

## 2022-03-03 DIAGNOSIS — J9611 Chronic respiratory failure with hypoxia: Secondary | ICD-10-CM | POA: Diagnosis not present

## 2022-03-03 LAB — CBC
HCT: 31.7 % — ABNORMAL LOW (ref 36.0–46.0)
Hemoglobin: 9.3 g/dL — ABNORMAL LOW (ref 12.0–15.0)
MCH: 29 pg (ref 26.0–34.0)
MCHC: 29.3 g/dL — ABNORMAL LOW (ref 30.0–36.0)
MCV: 98.8 fL (ref 80.0–100.0)
Platelets: 181 10*3/uL (ref 150–400)
RBC: 3.21 MIL/uL — ABNORMAL LOW (ref 3.87–5.11)
RDW: 16 % — ABNORMAL HIGH (ref 11.5–15.5)
WBC: 5.1 10*3/uL (ref 4.0–10.5)
nRBC: 0 % (ref 0.0–0.2)

## 2022-03-03 LAB — GLUCOSE, CAPILLARY
Glucose-Capillary: 112 mg/dL — ABNORMAL HIGH (ref 70–99)
Glucose-Capillary: 171 mg/dL — ABNORMAL HIGH (ref 70–99)

## 2022-03-03 LAB — BASIC METABOLIC PANEL
Anion gap: 8 (ref 5–15)
BUN: 12 mg/dL (ref 8–23)
CO2: 37 mmol/L — ABNORMAL HIGH (ref 22–32)
Calcium: 8.9 mg/dL (ref 8.9–10.3)
Chloride: 96 mmol/L — ABNORMAL LOW (ref 98–111)
Creatinine, Ser: 1 mg/dL (ref 0.44–1.00)
GFR, Estimated: 58 mL/min — ABNORMAL LOW (ref >60)
Glucose, Bld: 120 mg/dL — ABNORMAL HIGH (ref 70–99)
Potassium: 3.7 mmol/L (ref 3.5–5.1)
Sodium: 141 mmol/L (ref 135–145)

## 2022-03-03 MED ORDER — POTASSIUM CHLORIDE CRYS ER 20 MEQ PO TBCR: 20.0000 meq | EXTENDED_RELEASE_TABLET | Freq: Every day | ORAL | Status: AC

## 2022-03-03 MED ORDER — FUROSEMIDE 20 MG PO TABS: 40.0000 mg | ORAL_TABLET | Freq: Every morning | ORAL | Status: DC

## 2022-03-03 MED ORDER — AMLODIPINE BESYLATE 5 MG PO TABS: 5.0000 mg | ORAL_TABLET | Freq: Every day | ORAL | 1 refills | Status: DC

## 2022-03-03 MED ORDER — MAGNESIUM OXIDE 400 MG PO TABS: 400.0000 mg | ORAL_TABLET | Freq: Every day | ORAL | Status: AC

## 2022-03-03 MED ORDER — ALBUTEROL SULFATE HFA 108 (90 BASE) MCG/ACT IN AERS: 2.0000 | INHALATION_SPRAY | Freq: Four times a day (QID) | RESPIRATORY_TRACT | Status: DC | PRN

## 2022-03-03 MED ORDER — ASPIRIN 81 MG PO TBEC: 81.0000 mg | DELAYED_RELEASE_TABLET | Freq: Every day | ORAL | 12 refills | Status: AC

## 2022-03-03 NOTE — Progress Notes (Signed)
Patient doing well this morning 2 small dark stools over the past 24 hours.  No abdominal pain tolerating diet. Vital signs in last 24 hours: Temp:  [97.7 F (36.5 C)-98.3 F (36.8 C)] 98.3 F (36.8 C) (12/10 0443) Pulse Rate:  [66-108] 108 (12/10 0443) Resp:  [16-20] 16 (12/10 0443) BP: (106-125)/(61-69) 111/65 (12/10 0443) SpO2:  [96 %-100 %] 96 % (12/10 0807) Weight:  [75.6 kg] 75.6 kg (12/10 0644) Last BM Date : 03/01/22 General:   Alert,   pleasant and cooperative in NAD Abdomen:  Soft, nontender and nondistended.  Normal bowel sounds, without guarding, and without rebound.  No mass or organomegaly. Extremities:  Without clubbing or edema.    Intake/Output from previous day: 12/09 0701 - 12/10 0700 In: 480 [P.O.:480] Out: -  Intake/Output this shift: Total I/O In: 240 [P.O.:240] Out: -   Lab Results: Recent Labs    03/01/22 0509 03/02/22 0405 03/03/22 0550  WBC 5.9 6.1 5.1  HGB 9.1* 9.1* 9.3*  HCT 30.6* 30.1* 31.7*  PLT 181 196 181   BMET Recent Labs    03/01/22 0509 03/02/22 0405 03/03/22 0550  NA 141 140 141  K 4.7 3.9 3.7  CL 100 97* 96*  CO2 33* 34* 37*  GLUCOSE 88 92 120*  BUN 17 13 12   CREATININE 1.20* 1.02* 1.00  CALCIUM 9.1 9.0 8.9   LFT No results for input(s): "PROT", "ALBUMIN", "AST", "ALT", "ALKPHOS", "BILITOT", "BILIDIR", "IBILI" in the last 72 hours. PT/INR No results for input(s): "LABPROT", "INR" in the last 72 hours. Hepatitis Panel No results for input(s): "HEPBSAG", "HCVAB", "HEPAIGM", "HEPBIGM" in the last 72 hours. C-Diff No results for input(s): "CDIFFTOX" in the last 72 hours.  Studies/Results: DG CHEST PORT 1 VIEW  Result Date: 03/02/2022 CLINICAL DATA:  Cough, shortness of breath EXAM: PORTABLE CHEST 1 VIEW COMPARISON:  02/28/2022 FINDINGS: Prior CABG. Cardiomegaly, vascular congestion. Stable diffuse interstitial prominence. No visible effusions or pneumothorax. No acute bony abnormality. IMPRESSION: Cardiomegaly with  vascular congestion. Stable interstitial prominence which could reflect chronic lung disease or edema. Electronically Signed   By: Rolm Baptise M.D.   On: 03/02/2022 09:42   CT ENTERO ABD/PELVIS W CONTAST  Result Date: 03/01/2022 CLINICAL DATA:  GI bleed, loss of consciousness, hemoccult stool positive, lung cancer * Tracking Code: BO * EXAM: CT ABDOMEN AND PELVIS WITH CONTRAST (ENTEROGRAPHY) TECHNIQUE: Multidetector CT of the abdomen and pelvis during bolus administration of intravenous contrast. Negative oral contrast was given. RADIATION DOSE REDUCTION: This exam was performed according to the departmental dose-optimization program which includes automated exposure control, adjustment of the mA and/or kV according to patient size and/or use of iterative reconstruction technique. CONTRAST:  43mL OMNIPAQUE IOHEXOL 300 MG/ML  SOLN COMPARISON:  CT chest, 11/07/2021, PET-CT, 04/27/2019 FINDINGS: Lower chest: Cardiomegaly and three-vessel coronary artery calcifications status post median sternotomy and CABG. Small bilateral pleural effusions and associated atelectasis or consolidation, increased compared to prior examination. Extensive heterogeneous and consolidative airspace opacity throughout the lung bases, with evidence of prior right lower lobe wedge resection. Hepatobiliary: No solid liver abnormality is seen. No gallstones, gallbladder wall thickening, or biliary dilatation. Pancreas: Unremarkable. No pancreatic ductal dilatation or surrounding inflammatory changes. Spleen: Normal in size without significant abnormality. Adrenals/Urinary Tract: Adrenal glands are unremarkable. Bilateral renal vascular calcifications. Kidneys are normal, without renal calculi, solid lesion, or hydronephrosis. Bladder is unremarkable. Stomach/Bowel: Stomach is within normal limits. Appendix is not clearly visualized and may be surgically absent. Small bowel is underdistended by negative  oral enteric contrast. No obvious mass,  inflammatory findings, or other abnormality of the small bowel. Colon is fluid-filled to the rectum. Metallic foreign body in the posterior distal rectum (series 2, image 82). Vascular/Lymphatic: Aortic atherosclerosis. No enlarged abdominal or pelvic lymph nodes. Reproductive: Status post hysterectomy. Other: Midline ventral hernia mesh repair.  Anasarca.  No ascites. Musculoskeletal: No acute or significant osseous findings. IMPRESSION: 1. Small bowel is underdistended by negative oral enteric contrast. No obvious mass, inflammatory findings, or other abnormality of the small bowel. 2. Colon is fluid-filled to the rectum, in keeping with diarrheal illness. 3. Metallic foreign body in the posterior distal rectum, of uncertain nature, possibly endoscopic capsule. 4. Small bilateral pleural effusions and associated atelectasis or consolidation, increased compared to prior examination. 5. Extensive heterogeneous and consolidative airspace opacity throughout the lung consistent with infection or inflammation. 6. No evidence of lymphadenopathy or metastatic disease in the abdomen or pelvis. 7. Cardiomegaly and coronary artery disease. 8. Anasarca. Aortic Atherosclerosis (ICD10-I70.0). Electronically Signed   By: Delanna Ahmadi M.D.   On: 03/01/2022 18:29    Impression:    Frail 76 year old lady with numerous medical problems including CAD status post CABG heart failure diabetes hyperlipidemia history of lung cancer status post right upper lobe lobectomy history of PE COPD pulmonary hypertension   Chronic kidney disease  admitted to hospital with symptomatic anemia,  relatively acute decline in hemoglobin and positive stool.   Hemoglobin stable at 9.3 this morning.EGD, colonoscopy, video capsule and  CTE performed.   Small hernia found on EGD; small adenoma removed from her colon.  Prominent vascular pattern small bowel without suspect bleeding lesion being seen on VCE.  CTE demonstrated no evidence of small bowel  tumor.   I suspect she most likely has lost blood from her small bowel relating to a vascular lesion such as a Dieulafoy.   Recommendations:   Continue daily PPI  Follow H&H.  Okay with discharge.  I will arrange follow-up with Dr. Abbey Chatters.

## 2022-03-03 NOTE — Plan of Care (Signed)
Problem: Education: Goal: Ability to describe self-care measures that may prevent or decrease complications (Diabetes Survival Skills Education) will improve 03/03/2022 1141 by Melony Overly, RN Outcome: Adequate for Discharge 03/03/2022 1141 by Melony Overly, RN Outcome: Adequate for Discharge Goal: Individualized Educational Video(s) 03/03/2022 1141 by Melony Overly, RN Outcome: Adequate for Discharge 03/03/2022 1141 by Melony Overly, RN Outcome: Adequate for Discharge   Problem: Coping: Goal: Ability to adjust to condition or change in health will improve 03/03/2022 1141 by Melony Overly, RN Outcome: Adequate for Discharge 03/03/2022 1141 by Melony Overly, RN Outcome: Adequate for Discharge   Problem: Fluid Volume: Goal: Ability to maintain a balanced intake and output will improve 03/03/2022 1141 by Melony Overly, RN Outcome: Adequate for Discharge 03/03/2022 1141 by Melony Overly, RN Outcome: Adequate for Discharge   Problem: Health Behavior/Discharge Planning: Goal: Ability to identify and utilize available resources and services will improve 03/03/2022 1141 by Melony Overly, RN Outcome: Adequate for Discharge 03/03/2022 1141 by Melony Overly, RN Outcome: Adequate for Discharge Goal: Ability to manage health-related needs will improve 03/03/2022 1141 by Melony Overly, RN Outcome: Adequate for Discharge 03/03/2022 1141 by Melony Overly, RN Outcome: Adequate for Discharge   Problem: Metabolic: Goal: Ability to maintain appropriate glucose levels will improve 03/03/2022 1141 by Melony Overly, RN Outcome: Adequate for Discharge 03/03/2022 1141 by Melony Overly, RN Outcome: Adequate for Discharge   Problem: Nutritional: Goal: Maintenance of adequate nutrition will improve 03/03/2022 1141 by Melony Overly, RN Outcome: Adequate for Discharge 03/03/2022 1141 by Melony Overly, RN Outcome: Adequate for Discharge Goal: Progress toward  achieving an optimal weight will improve 03/03/2022 1141 by Melony Overly, RN Outcome: Adequate for Discharge 03/03/2022 1141 by Melony Overly, RN Outcome: Adequate for Discharge   Problem: Skin Integrity: Goal: Risk for impaired skin integrity will decrease 03/03/2022 1141 by Melony Overly, RN Outcome: Adequate for Discharge 03/03/2022 1141 by Melony Overly, RN Outcome: Adequate for Discharge   Problem: Tissue Perfusion: Goal: Adequacy of tissue perfusion will improve 03/03/2022 1141 by Melony Overly, RN Outcome: Adequate for Discharge 03/03/2022 1141 by Melony Overly, RN Outcome: Adequate for Discharge   Problem: Education: Goal: Knowledge of General Education information will improve Description: Including pain rating scale, medication(s)/side effects and non-pharmacologic comfort measures 03/03/2022 1141 by Melony Overly, RN Outcome: Adequate for Discharge 03/03/2022 1141 by Melony Overly, RN Outcome: Adequate for Discharge   Problem: Health Behavior/Discharge Planning: Goal: Ability to manage health-related needs will improve 03/03/2022 1141 by Melony Overly, RN Outcome: Adequate for Discharge 03/03/2022 1141 by Melony Overly, RN Outcome: Adequate for Discharge   Problem: Clinical Measurements: Goal: Ability to maintain clinical measurements within normal limits will improve 03/03/2022 1141 by Melony Overly, RN Outcome: Adequate for Discharge 03/03/2022 1141 by Melony Overly, RN Outcome: Adequate for Discharge Goal: Will remain free from infection 03/03/2022 1141 by Melony Overly, RN Outcome: Adequate for Discharge 03/03/2022 1141 by Melony Overly, RN Outcome: Adequate for Discharge Goal: Diagnostic test results will improve 03/03/2022 1141 by Melony Overly, RN Outcome: Adequate for Discharge 03/03/2022 1141 by Melony Overly, RN Outcome: Adequate for Discharge Goal: Respiratory complications will improve 03/03/2022 1141 by  Melony Overly, RN Outcome: Adequate for Discharge 03/03/2022 1141 by Melony Overly, RN Outcome: Adequate for Discharge Goal: Cardiovascular complication will be avoided 03/03/2022 1141 by Melony Overly, RN Outcome:  Adequate for Discharge 03/03/2022 1141 by Melony Overly, RN Outcome: Adequate for Discharge   Problem: Activity: Goal: Risk for activity intolerance will decrease 03/03/2022 1141 by Melony Overly, RN Outcome: Adequate for Discharge 03/03/2022 1141 by Melony Overly, RN Outcome: Adequate for Discharge   Problem: Nutrition: Goal: Adequate nutrition will be maintained 03/03/2022 1141 by Melony Overly, RN Outcome: Adequate for Discharge 03/03/2022 1141 by Melony Overly, RN Outcome: Adequate for Discharge   Problem: Coping: Goal: Level of anxiety will decrease 03/03/2022 1141 by Melony Overly, RN Outcome: Adequate for Discharge 03/03/2022 1141 by Melony Overly, RN Outcome: Adequate for Discharge   Problem: Elimination: Goal: Will not experience complications related to bowel motility 03/03/2022 1141 by Melony Overly, RN Outcome: Adequate for Discharge 03/03/2022 1141 by Melony Overly, RN Outcome: Adequate for Discharge Goal: Will not experience complications related to urinary retention 03/03/2022 1141 by Melony Overly, RN Outcome: Adequate for Discharge 03/03/2022 1141 by Melony Overly, RN Outcome: Adequate for Discharge   Problem: Pain Managment: Goal: General experience of comfort will improve 03/03/2022 1141 by Melony Overly, RN Outcome: Adequate for Discharge 03/03/2022 1141 by Melony Overly, RN Outcome: Adequate for Discharge   Problem: Safety: Goal: Ability to remain free from injury will improve 03/03/2022 1141 by Melony Overly, RN Outcome: Adequate for Discharge 03/03/2022 1141 by Melony Overly, RN Outcome: Adequate for Discharge   Problem: Skin Integrity: Goal: Risk for impaired skin integrity will  decrease 03/03/2022 1141 by Melony Overly, RN Outcome: Adequate for Discharge 03/03/2022 1141 by Melony Overly, RN Outcome: Adequate for Discharge

## 2022-03-03 NOTE — Progress Notes (Signed)
CSW spoke with patient to discuss steps to obtain a portable oxygen tank after discharge. Patient stated understanding and agreement and did not have any further concerns.  Madilyn Fireman, MSW, LCSW Transitions of Care  Clinical Social Worker II [phone]

## 2022-03-03 NOTE — Discharge Summary (Signed)
Physician Discharge Summary  Karen Tate BSW:[id number] DOB: June 27, 1945 DOA: 02/24/2022  PCP: Karen Burly, MD  Admit date: 02/24/2022 Discharge date: 03/03/2022  Admitted From:  Home  Disposition: Home   Recommendations for Outpatient Follow-up:  Follow up with PCP in 1 weeks to check labs: (CBC) Follow up with Dr. Abbey Tate in 3 weeks Follow up with Dr. Harl Tate in 2-3 weeks  Please check CBC in 1 week to follow up hemoglobin  Home Health:  Outpatient PT   Discharge Condition: STABLE   CODE STATUS: DNR DIET: Carb modified    Brief Hospitalization Summary: Please see all hospital notes, images, labs for full details of the hospitalization.   ADMISSION HPI:  76 year old female with a history of coronary artery disease status post CABG 2019, HFpEF, diabetes mellitus type 2, hyperlipidemia, postoperative atrial fibrillation, non-small cell lung cancer status post lobectomy 05/13/2019, COPD, chronic respiratory failure on 2 L and pulmonary hypertension presenting with a syncopal episode.  The patient spouse was helping the patient get back into the bedroom when she had a syncopal episode lasting about 2 minutes.  There is no postictal symptoms.  There is no tonic-clonic activity.  However, the patient states that she has had increasing shortness of breath and dyspnea on exertion for the past 2 months, worst in the past 2 to 3 weeks.  She denies any chest pain, coughing, hemoptysis.  She has not had any fevers, chills, nausea, vomiting, diarrhea, but she states that she has been having some melanotic stools.  In the ED, the patient was afebrile and hemodynamically stable with oxygen saturation 100% on room 2 L.  WBC 6.5, hemoglobin 7.4, platelets 255,000.  FOBT was positive. Sodium 140, potassium 5.7, bicarbonate 31, serum creatinine 2.09.  Chest x-ray showed increased interstitial markings.   Cardiology and GI were consulted to assist with management.    Hospital Course by problem list    Syncope -Likely secondary to blood loss anemia with drop in hemoglobin from 10.7 (11/07/21)>> 7.4>>9.3 -EEG--neg for seizure -Cardiology consult appreciated -Remain on telemetry>>afib with SVR -07/21/2020 echo EF 60 to 65%,+WMX, grade 2 DD, RVSP 61.3, moderate TR/MR -02/25/22 Echo EF 65-70%, no WMA, RV overload, mod-severe TR -Pt does not seem to tolerate AV nodal blocking agents and has been taken off carvedilol   Melena/Acute Blood Loss Anemia -GI consult appreciated and planning endoscopy completed 12/6 -Givens capsule given 12/6 per GI service  -Hgb 10.7 (11/07/21)>>7.4>>6.7 -2 units PRBC given, Hg now 9.3 -continue PPI therapy -12/5 EGD--normal stomach/duodenum, 3 cm hiatus hernia -completed colonoscopy 12/6; no source of bleeding found -Givens capsule given 12/6 by GI team and CT enteroscopy completed   -small hernia found on EGD; small adenoma removed from colon, prominent vascular pattern small bowel without suspect bleeding lesion being seen VCE.  CTE demonstrated no evidence of small bowel tumor.  GI team reported suspect she most  likely has lost blood from her small bowel relating to a vascular lesion such as a Dieulafoy.  -per GI recs:  May continue 81 mg aspirin daily as the benefits still appear to outweigh the risks.  It needs to be kept in mind that even a baby aspirin daily does increase the relative risk of GI bleeding.  -Pt advised to see PCP in 1 week for CBC lab check to follow up hemoglobin level.    GAD  - with benzo dependence - can get severe at times with panic symptoms while in hospital - added prn IV lorazepam as  needed in hospital, resume home oral meds at DC - resume home daily alprazolam   Acute on chronic diastolic CHF -The patient does have some signs of fluid overload -Cardiology following -IV furosemide given by cardiology 12/7 and 12/8 -I gave an extra IV furosemide dose on 12/7  -07/21/2020 echo EF 60 to 65%, +WMA, grade 2 DD, moderate TR, RSVP  61.3 -02/25/22 Echo EF 65-70%, no WMA, RV overload, mod-severe TR   Intake/Output Summary (Last 24 hours) at 03/02/2022 1304 Last data filed at 03/02/2022 0900    Gross per 24 hour  Intake 240 ml  Output 1501 ml  Net -1261 ml      Filed Weights   03/01/22 0500 03/02/22 0616 03/03/22 0644  Weight: 80.6 kg 75.6 kg 75.6 kg    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 -Baseline creatinine 1.0-1.3 -Patient presented with serum creatinine 2.09 -Monitor with diuresis -Hold lisinopril due to soft BPs -pt to follow up with PCP and cardiology    Hyperkalemia -Presented with serum potassium 5.7 -Lokelma given -treated and resolved (4.5)   Afib, type unspecified -SVR--HR 40-60 -holding coreg due to severe bradycardia  -not a candidate for [facility] due to active GI bleed -pt does not seem to tolerate AV nodal blocking agents well -Pt to follow up with cardiology outpatient for recheck    Carotid stenosis -has had known stenosis -not a candidate for operative intervention given age, frailty, poor baseline function -R-ICA <50%, Left ICA 70%   Diabetes mellitus type 2 with hyperglycemia -11/12/2021 hemoglobin A1c 7.5 -NovoLog sliding scale -Holding metformin, resume at discharge CBG (last 3)  Recent Labs (last 2 labs)       Recent Labs    03/01/22 2044 03/02/22 0714 03/02/22 1131  GLUCAP 89 88 135*      Essential hypertension -Holding carvedilol secondary bradycardia -Hold lisinopril secondary to hyperkalemia and renal failure -resuming amlodipine 5 mg daily  -BPs will be monitored    COPD -Stable without exacerbation -Continue Advair   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 Delton Coombes   Mixed Hyperlipidemia -continue statin   Pulmonary HTN -- Most likely secondary to her chronic O2 dependent lung disease, left sided heart diseas  -- no further testing at this time per cardiology given pt's frailty and co-morbidities --IV lasix given on  12/7, 12/8 resumed home oral lasix 12/9     Discharge Diagnoses:  Principal Problem:   Syncope and collapse Active Problems:   DMII (diabetes mellitus, type 2) (HCC)   Coronary artery disease involving native heart without angina pectoris   Non-small cell cancer of right lung (HCC)   GI bleed   Hyperkalemia   Chronic respiratory failure with hypoxia (HCC)   Chronic diastolic CHF (congestive heart failure) (HCC)   Essential hypertension   Mixed hyperlipidemia   Acute kidney injury (nontraumatic) (HCC)   ABLA (acute blood loss anemia)   Acute blood loss anemia (ABLA)   Atrial fibrillation with slow ventricular response [facility])   Discharge Instructions: Discharge Instructions     Ambulatory referral to Physical Therapy   Complete by: As directed    Patient lives in [place], [place]   Ambulatory referral to Physical Therapy   Complete by: As directed       Allergies as of 03/03/2022       Reactions   Beta Adrenergic Blockers Other (See Comments)   Dropped heart rate to the 40's   Calcium Channel Blockers Other (See Comments)   Dropped  HR into the 40's   Naproxen Hives        Medication List     STOP taking these medications    carvedilol 25 MG tablet Commonly known as: COREG   lisinopril 40 MG tablet Commonly known as: ZESTRIL   metFORMIN 1000 MG tablet Commonly known as: GLUCOPHAGE       TAKE these medications    acetaminophen 500 MG tablet Commonly known as: TYLENOL Take 1,000 mg by mouth every 6 (six) hours as needed for moderate pain.   albuterol 108 (90 Base) MCG/ACT inhaler Commonly known as: VENTOLIN HFA Inhale 2 puffs into the lungs every 6 (six) hours as needed for wheezing or shortness of breath.   ALPRAZolam 0.5 MG tablet Commonly known as: XANAX Take 0.5 mg by mouth 2 (two) times daily.   amLODipine 5 MG tablet Commonly known as: NORVASC Take 1 tablet (5 mg total) by mouth daily. What changed:  medication strength how much to take    aspirin EC 81 MG tablet Take 1 tablet (81 mg total) by mouth daily. Swallow whole. Start taking on: March 04, 2022 What changed: additional instructions   atorvastatin 80 MG tablet Commonly known as: LIPITOR Take 1 tablet (80 mg total) by mouth daily at 6 PM.   fish oil-omega-3 fatty acids 1000 MG capsule Take 1 g by mouth 3 (three) times daily.   fluticasone-salmeterol 250-50 MCG/ACT Aepb Commonly known as: ADVAIR Inhale 1 puff into the lungs in the morning and at bedtime. Advair 250/50 one puff 2 times/ day   folic acid 1 MG tablet Commonly known as: FOLVITE TAKE 1 TABLET EVERY DAY   furosemide 20 MG tablet Commonly known as: LASIX Take 2 tablets (40 mg total) by mouth in the morning.   magnesium oxide 400 MG tablet Commonly known as: MAG-OX Take 1 tablet (400 mg total) by mouth daily.   meclizine 25 MG tablet Commonly known as: ANTIVERT Take 25 mg by mouth 3 (three) times daily as needed for dizziness.   omeprazole 40 MG capsule Commonly known as: PRILOSEC Take 40 mg by mouth daily.   potassium chloride SA 20 MEQ tablet Commonly known as: KLOR-CON M Take 1 tablet (20 mEq total) by mouth daily.        Follow-up Information     Karen Burly, MD. Schedule an appointment as soon as possible for a visit in 1 week(s).   Specialty: Internal Medicine Why: Hospital Follow Up Contact information: [address] [place] [address] [phone]         [facility]. Schedule an appointment as soon as possible for a visit in 3 week(s).   Why: Hospital Follow Up Contact information: [address] [place] [phone]        Arnoldo Lenis, MD. Schedule an appointment as soon as possible for a visit in 2 week(s).   Specialty: Cardiology Why: Hospital Follow Up Contact information: [place] [address] [phone]                Allergies  Allergen Reactions    Beta Adrenergic Blockers Other (See Comments)    Dropped heart rate to the 40's   Calcium Channel Blockers Other (See Comments)    Dropped HR into the 40's   Naproxen Hives   Allergies as of 03/03/2022       Reactions   Beta Adrenergic Blockers Other (See Comments)   Dropped heart rate to  the 40's   Calcium Channel Blockers Other (See Comments)   Dropped HR into the 40's   Naproxen Hives        Medication List     STOP taking these medications    carvedilol 25 MG tablet Commonly known as: COREG   lisinopril 40 MG tablet Commonly known as: ZESTRIL   metFORMIN 1000 MG tablet Commonly known as: GLUCOPHAGE       TAKE these medications    acetaminophen 500 MG tablet Commonly known as: TYLENOL Take 1,000 mg by mouth every 6 (six) hours as needed for moderate pain.   albuterol 108 (90 Base) MCG/ACT inhaler Commonly known as: VENTOLIN HFA Inhale 2 puffs into the lungs every 6 (six) hours as needed for wheezing or shortness of breath.   ALPRAZolam 0.5 MG tablet Commonly known as: XANAX Take 0.5 mg by mouth 2 (two) times daily.   amLODipine 5 MG tablet Commonly known as: NORVASC Take 1 tablet (5 mg total) by mouth daily. What changed:  medication strength how much to take   aspirin EC 81 MG tablet Take 1 tablet (81 mg total) by mouth daily. Swallow whole. Start taking on: March 04, 2022 What changed: additional instructions   atorvastatin 80 MG tablet Commonly known as: LIPITOR Take 1 tablet (80 mg total) by mouth daily at 6 PM.   fish oil-omega-3 fatty acids 1000 MG capsule Take 1 g by mouth 3 (three) times daily.   fluticasone-salmeterol 250-50 MCG/ACT Aepb Commonly known as: ADVAIR Inhale 1 puff into the lungs in the morning and at bedtime. Advair 250/50 one puff 2 times/ day   folic acid 1 MG tablet Commonly known as: FOLVITE TAKE 1 TABLET EVERY DAY   furosemide 20 MG tablet Commonly known as: LASIX Take 2 tablets (40 mg total) by mouth in  the morning.   magnesium oxide 400 MG tablet Commonly known as: MAG-OX Take 1 tablet (400 mg total) by mouth daily.   meclizine 25 MG tablet Commonly known as: ANTIVERT Take 25 mg by mouth 3 (three) times daily as needed for dizziness.   omeprazole 40 MG capsule Commonly known as: PRILOSEC Take 40 mg by mouth daily.   potassium chloride SA 20 MEQ tablet Commonly known as: KLOR-CON M Take 1 tablet (20 mEq total) by mouth daily.        Procedures/Studies: DG CHEST PORT 1 VIEW  Result Date: 03/02/2022 CLINICAL DATA:  Cough, shortness of breath EXAM: PORTABLE CHEST 1 VIEW COMPARISON:  02/28/2022 FINDINGS: Prior CABG. Cardiomegaly, vascular congestion. Stable diffuse interstitial prominence. No visible effusions or pneumothorax. No acute bony abnormality. IMPRESSION: Cardiomegaly with vascular congestion. Stable interstitial prominence which could reflect chronic lung disease or edema. Electronically Signed   By: Rolm Baptise M.D.   On: 03/02/2022 09:42   CT ENTERO ABD/PELVIS W CONTAST  Result Date: 03/01/2022 CLINICAL DATA:  GI bleed, loss of consciousness, hemoccult stool positive, lung cancer * Tracking Code: BO * EXAM: CT ABDOMEN AND PELVIS WITH CONTRAST (ENTEROGRAPHY) TECHNIQUE: Multidetector CT of the abdomen and pelvis during bolus administration of intravenous contrast. Negative oral contrast was given. RADIATION DOSE REDUCTION: This exam was performed according to the departmental dose-optimization program which includes automated exposure control, adjustment of the mA and/or kV according to patient size and/or use of iterative reconstruction technique. CONTRAST:  62mL OMNIPAQUE IOHEXOL 300 MG/ML  SOLN COMPARISON:  CT chest, 11/07/2021, PET-CT, 04/27/2019 FINDINGS: Lower chest: Cardiomegaly and three-vessel coronary artery calcifications status post median sternotomy and CABG. Small  bilateral pleural effusions and associated atelectasis or consolidation, increased compared to prior  examination. Extensive heterogeneous and consolidative airspace opacity throughout the lung bases, with evidence of prior right lower lobe wedge resection. Hepatobiliary: No solid liver abnormality is seen. No gallstones, gallbladder wall thickening, or biliary dilatation. Pancreas: Unremarkable. No pancreatic ductal dilatation or surrounding inflammatory changes. Spleen: Normal in size without significant abnormality. Adrenals/Urinary Tract: Adrenal glands are unremarkable. Bilateral renal vascular calcifications. Kidneys are normal, without renal calculi, solid lesion, or hydronephrosis. Bladder is unremarkable. Stomach/Bowel: Stomach is within normal limits. Appendix is not clearly visualized and may be surgically absent. Small bowel is underdistended by negative oral enteric contrast. No obvious mass, inflammatory findings, or other abnormality of the small bowel. Colon is fluid-filled to the rectum. Metallic foreign body in the posterior distal rectum (series 2, image 82). Vascular/Lymphatic: Aortic atherosclerosis. No enlarged abdominal or pelvic lymph nodes. Reproductive: Status post hysterectomy. Other: Midline ventral hernia mesh repair.  Anasarca.  No ascites. Musculoskeletal: No acute or significant osseous findings. IMPRESSION: 1. Small bowel is underdistended by negative oral enteric contrast. No obvious mass, inflammatory findings, or other abnormality of the small bowel. 2. Colon is fluid-filled to the rectum, in keeping with diarrheal illness. 3. Metallic foreign body in the posterior distal rectum, of uncertain nature, possibly endoscopic capsule. 4. Small bilateral pleural effusions and associated atelectasis or consolidation, increased compared to prior examination. 5. Extensive heterogeneous and consolidative airspace opacity throughout the lung consistent with infection or inflammation. 6. No evidence of lymphadenopathy or metastatic disease in the abdomen or pelvis. 7. Cardiomegaly and coronary  artery disease. 8. Anasarca. Aortic Atherosclerosis (ICD10-I70.0). Electronically Signed   By: Delanna Ahmadi M.D.   On: 03/01/2022 18:29   DG CHEST PORT 1 VIEW  Result Date: 02/28/2022 CLINICAL DATA:  Shortness of breath. EXAM: PORTABLE CHEST 1 VIEW COMPARISON:  February 25, 2022. FINDINGS: Stable cardiomediastinal silhouette. Status post coronary bypass graft. Stable bibasilar interstitial lung opacities are noted which may represent scarring, but acute superimposed edema cannot be excluded. Small right pleural effusion is noted. Bony thorax is unremarkable. IMPRESSION: Stable bibasilar interstitial densities are noted which may represent scarring, but acute superimposed edema cannot be excluded. Small right pleural effusion. Electronically Signed   By: Marijo Conception M.D.   On: 02/28/2022 11:38   EEG adult  Result Date: 02/25/2022 Spero Curb, MD     02/25/2022  9:29 PM [facility] Routine EEG Report Video Performed: Performed Demographics: Patient Name:   Ayat, Drenning Date of Birth:   Sep 29, 1945 Identification Number:   MRN - [id number] Study Times: Study Start Time:   02/25/2022 13:33:00 Study End Time:   02/25/2022 14:16:00 Indication(s): Syncope Medication: Xanax Technical Summary: This EEG was performed utilizing standard International 10-20 System of electrode placement. One channel electrocardiogram was monitored. Data were obtained, stored, and interpreted utilizing referential montage recording, with reformatting to longitudinal, transverse bipolar, and referential montages as necessary for interpretation. State(s):       Awake      Drowsy Activation Procedures: Hyperventilation: Not performed Photic Stimulation: Not performed EEG Description: During wakefulness, the background showed a continuous 8 Hz posterior dominant alpha rhythm which is fairly modulated, symmetrical and reactive to eye opening.  Drowsiness demonstrated a background  attenuation. Sleep stage II was not reached.  There was no clinical event noted.  Throughout the record, there was no epileptiform abnormality or lateralizing sign observed.  Intermittent myogenic and movement artifacts were noted.   Impression: This  is a normal awake and drowsy EEG. No epileptiform abnormality or lateralizing sign is observed.  Note that an absence of epileptiform abnormality does not exclude nor support the diagnosis of epilepsy. Dr Spero Curb [facility] For Inpatient follow-up with [facility] physician please call [facility] [phone]. This is not an outpatient service. Post hospital discharge, please contact hospital directly.   ECHOCARDIOGRAM COMPLETE  Result Date: 02/25/2022    ECHOCARDIOGRAM REPORT   Patient Name:   BETTYLEE FEIG Date of Exam: 02/25/2022 Medical Rec #:  [id number]    Height:       65.0 in Accession #:    [id number]   Weight:       175.0 lb Date of Birth:  05-09-1945    BSA:          1.869 m Patient Age:    17 years     BP:           116/55 mmHg Patient Gender: F            HR:           57 bpm. Exam Location:  [facility] Procedure: 2D Echo, Cardiac Doppler and Color Doppler Indications:    Syncope  History:        Patient has prior history of Echocardiogram examinations, most                 recent 07/21/2020. CHF, Previous Myocardial Infarction and CAD,                 Prior CABG, Signs/Symptoms:Chest Pain and Syncope; Risk                 Factors:Hypertension, Diabetes and Dyslipidemia.  Sonographer:    Wenda Low Referring Phys: [id number] OLADAPO ADEFESO IMPRESSIONS  1. Left ventricular ejection fraction, by estimation, is 65 to 70%. The left ventricle has normal function. The left ventricle has no regional wall motion abnormalities. Left ventricular diastolic parameters are indeterminate.  2. Ventricular septum is flattnened in systole and diastole suggesting RV pressure and volume overload. RV not well visualized, grossly appears enlarged with  decreased systolic function. . Right ventricular systolic function was not well visualized. The  right ventricular size is not well visualized. There is severely elevated pulmonary artery systolic pressure.  3. Left atrial size was mildly dilated.  4. The mitral valve is abnormal. Mild mitral valve regurgitation. No evidence of mitral stenosis. Moderate mitral annular calcification.  5. Moderate to severe TR, hepatic systolic flow reversal would suggest severe TR. . Tricuspid valve regurgitation is moderate. 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  6. The aortic valve is tricuspid. There is moderate calcification of the aortic valve. There is moderate thickening of the aortic valve. Aortic valve regurgitation is not visualized. No aortic stenosis is present.  7. The pulmonic valve was abnormal.  8. The inferior vena cava is dilated in size with <50% respiratory variability, suggesting right atrial pressure of 15 mmHg. FINDINGS  Left Ventricle: Left ventricular ejection fraction, by estimation, is 65 to 70%. The left ventricle has normal function. The left ventricle has no regional wall motion abnormalities. The left ventricular internal cavity size was normal in size. There is  no left ventricular hypertrophy. Left ventricular diastolic parameters are indeterminate. Right Ventricle: Ventricular septum is flattnened in systole and diastole suggesting RV pressure and volume overload. RV not well visualized, grossly appears enlarged with decreased systolic function. The right ventricular size is not well visualized. Right vetricular wall thickness was not  well visualized. Right ventricular systolic function was not well visualized. There is severely elevated pulmonary artery systolic pressure. The tricuspid regurgitant velocity is 4.20 m/s, and with an assumed right  atrial pressure of 15 mmHg, the estimated right ventricular systolic pressure is 62.6 mmHg. Left Atrium: Left atrial size was mildly dilated. Right Atrium:  Right atrial size was not well visualized. Pericardium: There is no evidence of pericardial effusion. Mitral Valve: The mitral valve is abnormal. There is moderate thickening of the mitral valve leaflet(s). There is moderate calcification of the mitral valve leaflet(s). Moderate mitral annular calcification. Mild mitral valve regurgitation. No evidence of mitral valve stenosis. MV peak gradient, 10.6 mmHg. The mean mitral valve gradient is 3.0 mmHg. Tricuspid Valve: Moderate to severe TR, hepatic systolic flow reversal would suggest severe TR. The tricuspid valve is not well visualized. Tricuspid valve regurgitation is moderate . 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 Aortic Valve: The aortic valve is tricuspid. There is moderate calcification of the aortic valve. There is moderate thickening of the aortic valve. There is moderate aortic valve annular calcification. Aortic valve regurgitation is not visualized. No aortic stenosis is present. Aortic valve mean gradient measures 4.5 mmHg. Aortic valve peak gradient measures 8.8 mmHg. Aortic valve area, by VTI measures 2.74 cm. Pulmonic Valve: The pulmonic valve was abnormal. Pulmonic valve regurgitation is mild. No evidence of pulmonic stenosis. Aorta: The aortic root is normal in size and structure. Venous: The inferior vena cava is dilated in size with less than 50% respiratory variability, suggesting right atrial pressure of 15 mmHg. IAS/Shunts: No atrial level shunt detected by color flow Doppler.  LEFT VENTRICLE PLAX 2D LVIDd:         4.70 cm LVIDs:         2.80 cm LV PW:         1.00 cm LV IVS:        0.80 cm LVOT diam:     1.90 cm LV SV:         101 LV SV Index:   54 LVOT Area:     2.84 cm  RIGHT VENTRICLE RV Basal diam:  4.60 cm RV Mid diam:    3.90 cm LEFT ATRIUM             Index        RIGHT ATRIUM           Index LA diam:        4.40 cm 2.35 cm/m   RA Area:     20.00 cm LA Vol (A2C):   64.7 ml 34.62 ml/m  RA Volume:   60.40 ml  32.32 ml/m LA Vol  (A4C):   71.7 ml 38.36 ml/m LA Biplane Vol: 73.4 ml 39.27 ml/m  AORTIC VALVE                    PULMONIC VALVE AV Area (Vmax):    2.62 cm     PV Vmax:       1.14 m/s AV Area (Vmean):   2.61 cm     PV Peak grad:  5.2 mmHg AV Area (VTI):     2.74 cm AV Vmax:           148.50 cm/s AV Vmean:          98.750 cm/s AV VTI:            0.370 m AV Peak Grad:      8.8 mmHg AV Mean Grad:  4.5 mmHg LVOT Vmax:         137.00 cm/s LVOT Vmean:        90.800 cm/s LVOT VTI:          0.357 m LVOT/AV VTI ratio: 0.96  AORTA Ao Root diam: 2.50 cm MITRAL VALVE                TRICUSPID VALVE MV Area (PHT): 4.12 cm     TR Peak grad:   70.6 mmHg MV Area VTI:   1.95 cm     TR Vmax:        420.00 cm/s MV Peak grad:  10.6 mmHg MV Mean grad:  3.0 mmHg     SHUNTS MV Vmax:       1.63 m/s     Systemic VTI:  0.36 m MV Vmean:      71.4 cm/s    Systemic Diam: 1.90 cm MV Decel Time: 184 msec MR Peak grad: 66.9 mmHg MR Mean grad: 39.0 mmHg MR Vmax:      409.00 cm/s MR Vmean:     297.0 cm/s MV E velocity: 162.00 cm/s Carlyle Dolly MD Electronically signed by Carlyle Dolly MD Signature Date/Time: 02/25/2022/4:55:05 PM    Final    US Carotid Bilateral  Result Date: 02/25/2022 CLINICAL DATA:  Syncope Hypertension Hyperlipidemia Diabetes Remote tobacco use history EXAM: BILATERAL CAROTID DUPLEX ULTRASOUND TECHNIQUE: Pearline Cables scale imaging, color Doppler and duplex ultrasound were performed of bilateral carotid and vertebral arteries in the neck. COMPARISON:  None available FINDINGS: Criteria: Quantification of carotid stenosis is based on velocity parameters that correlate the residual internal carotid diameter with NASCET-based stenosis levels, using the diameter of the distal internal carotid lumen as the denominator for stenosis measurement. The following velocity measurements were obtained: RIGHT ICA: 117/24 cm/sec CCA: 77/82 cm/sec SYSTOLIC ICA/CCA RATIO:  1.2 ECA: 241 cm/sec LEFT ICA: 244/46 cm/sec CCA: 42/35 cm/sec SYSTOLIC ICA/CCA  RATIO:  3.0 ECA: 100 cm/sec RIGHT CAROTID ARTERY: Mild to moderate shadowing calcified plaque noted at the carotid bifurcation. RIGHT VERTEBRAL ARTERY:  Antegrade flow. LEFT CAROTID ARTERY: Moderate shadowing calcified plaque of the carotid bifurcation. LEFT VERTEBRAL ARTERY:  Antegrade flow. IMPRESSION: 1. 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 2. Less than 50% stenosis of the right internal carotid artery. Electronically Signed   By: Miachel Roux M.D.   On: 02/25/2022 09:31   DG Chest Port 1 View  Result Date: 02/25/2022 CLINICAL DATA:  Syncope. EXAM: PORTABLE CHEST 1 VIEW COMPARISON:  October 21, 2021 FINDINGS: Multiple sternal wires and vascular clips are noted. The cardiac silhouette is mildly enlarged and unchanged in size. There is stable mild to moderate severity elevation of the right hemidiaphragm. Chronic appearing increased lung markings are seen without evidence of an acute infiltrate, pleural effusion or pneumothorax. Multilevel degenerative changes are seen throughout the thoracic spine. IMPRESSION: 1. Evidence of prior median sternotomy/CABG. 2. Chronic appearing increased lung markings without acute cardiopulmonary disease. Electronically Signed   By: Virgina Norfolk M.D.   On: 02/25/2022 02:40     Subjective: Pt says she feels well, breathing well, no chest pain, no palpitations.   Discharge Exam: Vitals:   03/03/22 0443 03/03/22 0807  BP: 111/65   Pulse: (!) 108   Resp: 16   Temp: 98.3 F (36.8 C)   SpO2: 100% 96%   Vitals:   03/02/22 2221 03/03/22 0443 03/03/22 0644 03/03/22 0807  BP:  106/69 111/65    Pulse: 66 (!) 108    Resp: 20 16    Temp: 97.7 F (36.5 C) 98.3 F (36.8 C)    TempSrc: Oral Oral    SpO2: 100% 100%  96%  Weight:   75.6 kg   Height:       General: Pt is alert, awake, not in acute distress Cardiovascular: normal S1/S2 +, no rubs, no  gallops Respiratory: CTA bilaterally, no wheezing, no rhonchi Abdominal: Soft, NT, ND, bowel sounds + Extremities: no edema, no cyanosis   The results of significant diagnostics from this hospitalization (including imaging, microbiology, ancillary and laboratory) are listed below for reference.     Microbiology: Recent Results (from the past 240 hour(s))  Urine Culture     Status: Abnormal   Collection Time: 02/26/22  5:19 AM   Specimen: Urine, Clean Catch  Result Value Ref Range Status   Specimen Description   Final    URINE, CLEAN CATCH Performed at [facility], [address], [place], [place] [address]    Special Requests   Final    NONE Performed at [facility], [address], [place], [place] [address]    Culture (A)  Final    20,000 COLONIES/mL GROUP B STREP(S.AGALACTIAE)ISOLATED TESTING AGAINST S. AGALACTIAE NOT ROUTINELY PERFORMED DUE TO PREDICTABILITY OF AMP/PEN/VAN SUSCEPTIBILITY. Performed at [facility], [place] [address] [place], [place] [address]    Report Status 02/27/2022 FINAL  Final     Labs: BNP (last 3 results) Recent Labs    10/21/21 1302 02/25/22 0238 03/01/22 0509  BNP 328.0* 459.0* 2,229.7*   Basic Metabolic Panel: Recent Labs  Lab 02/26/22 0255 02/26/22 9892 02/27/22 0557 02/27/22 0558 02/28/22 0557 03/01/22 0509 03/02/22 0405 03/03/22 0550  NA 139  --  138  --  141 141 140 141  K 4.8   < > 4.4 4.5 4.5 4.7 3.9 3.7  CL 100  --  102  --  104 100 97* 96*  CO2 31  --  29  --  30 33* 34* 37*  GLUCOSE 83  --  85  --  74 88 92 120*  BUN 32*  --  23  --  18 17 13 12   CREATININE 1.83*  --  1.41*  --  1.25* 1.20* 1.02* 1.00  CALCIUM 8.5*  --  8.5*  --  8.9 9.1 9.0 8.9  MG 2.2  --  2.0  --  2.0 1.8  --   --   PHOS 4.5  --   --   --   --   --   --   --    < > = values in this interval not displayed.   Liver Function Tests: Recent Labs  Lab 02/26/22 0255  AST 15  ALT 11  ALKPHOS 47  BILITOT 0.9  PROT 6.3*  ALBUMIN 2.7*   No  results for input(s): "LIPASE", "AMYLASE" in the last 168 hours. No results for input(s): "AMMONIA" in the last 168 hours. CBC: Recent Labs  Lab 02/27/22 0557 02/28/22 0557 03/01/22 0509 03/02/22 0405 03/03/22 0550  WBC 5.4 6.8 5.9 6.1 5.1  HGB 9.2* 9.3* 9.1* 9.1* 9.3*  HCT 30.5* 30.8* 30.6* 30.1* 31.7*  MCV 96.8 97.8 98.7 98.0 98.8  PLT 191 203 181 196 181   Cardiac Enzymes: No results for input(s): "CKTOTAL", "CKMB", "CKMBINDEX", "TROPONINI" in the last 168 hours. BNP: Invalid input(s): "POCBNP" CBG: Recent Labs  Lab 03/02/22 0714 03/02/22 1131 03/02/22  1617 03/02/22 2225 03/03/22 0714  GLUCAP 88 135* 171* 132* 112*   D-Dimer No results for input(s): "DDIMER" in the last 72 hours. Hgb A1c No results for input(s): "HGBA1C" in the last 72 hours. Lipid Profile No results for input(s): "CHOL", "HDL", "LDLCALC", "TRIG", "CHOLHDL", "LDLDIRECT" in the last 72 hours. Thyroid function studies No results for input(s): "TSH", "T4TOTAL", "T3FREE", "THYROIDAB" in the last 72 hours.  Invalid input(s): "FREET3" Anemia work up No results for input(s): "VITAMINB12", "FOLATE", "FERRITIN", "TIBC", "IRON", "RETICCTPCT" in the last 72 hours. Urinalysis    Component Value Date/Time   COLORURINE YELLOW 02/26/2022 Mineral 02/26/2022 0519   LABSPEC 1.013 02/26/2022 0519   PHURINE 5.0 02/26/2022 0519   GLUCOSEU NEGATIVE 02/26/2022 0519   HGBUR NEGATIVE 02/26/2022 0519   BILIRUBINUR NEGATIVE 02/26/2022 0519   KETONESUR NEGATIVE 02/26/2022 0519   PROTEINUR 30 (A) 02/26/2022 0519   NITRITE NEGATIVE 02/26/2022 0519   LEUKOCYTESUR TRACE (A) 02/26/2022 0519   Sepsis Labs Recent Labs  Lab 02/28/22 0557 03/01/22 0509 03/02/22 0405 03/03/22 0550  WBC 6.8 5.9 6.1 5.1   Microbiology Recent Results (from the past 240 hour(s))  Urine Culture     Status: Abnormal   Collection Time: 02/26/22  5:19 AM   Specimen: Urine, Clean Catch  Result Value Ref Range Status    Specimen Description   Final    URINE, CLEAN CATCH Performed at [facility], [address] [place], [place] [address]    Special Requests   Final    NONE Performed at [facility], [address] [place], [place] [address]    Culture (A)  Final    20,000 COLONIES/mL GROUP B STREP(S.AGALACTIAE)ISOLATED TESTING AGAINST S. AGALACTIAE NOT ROUTINELY PERFORMED DUE TO PREDICTABILITY OF AMP/PEN/VAN SUSCEPTIBILITY. Performed at [facility], [place] [address] [place], [place] [address]    Report Status 02/27/2022 FINAL  Final    Time coordinating discharge: 44 mins  SIGNED:  Irwin Brakeman, MD  [facility] 03/03/2022, 10:31 AM How to contact the [facility] Attending or Consulting provider [place] or covering provider during after hours [place], for this patient?  Check the care team in [facility] and look for a) attending/consulting [facility] provider listed and b) the [facility] team listed Log into [email or web address] and use [place]'s universal password to access. If you do not have the password, please contact the hospital operator. Locate the [facility] provider you are looking for under [facility] and page to a number that you can be directly reached. If you still have difficulty reaching the provider, please page the [facility] (Director on Call) for the Hospitalists listed on amion for assistance.

## 2022-03-03 NOTE — Discharge Instructions (Signed)
IMPORTANT INFORMATION: PAY CLOSE ATTENTION  ? ?PHYSICIAN DISCHARGE INSTRUCTIONS ? ?Follow with Primary care provider  Hasanaj, Xaje A, MD  and other consultants as instructed by your Hospitalist Physician ? ?SEEK MEDICAL CARE OR RETURN TO EMERGENCY ROOM IF SYMPTOMS COME BACK, WORSEN OR NEW PROBLEM DEVELOPS  ? ?Please note: ?You were cared for by a hospitalist during your hospital stay. Every effort will be made to forward records to your primary care provider.  You can request that your primary care provider send for your hospital records if they have not received them.  Once you are discharged, your primary care physician will handle any further medical issues. Please note that NO REFILLS for any discharge medications will be authorized once you are discharged, as it is imperative that you return to your primary care physician (or establish a relationship with a primary care physician if you do not have one) for your post hospital discharge needs so that they can reassess your need for medications and monitor your lab values. ? ?Please get a complete blood count and chemistry panel checked by your Primary MD at your next visit, and again as instructed by your Primary MD. ? ?Get Medicines reviewed and adjusted: ?Please take all your medications with you for your next visit with your Primary MD ? ?Laboratory/radiological data: ?Please request your Primary MD to go over all hospital tests and procedure/radiological results at the follow up, please ask your primary care provider to get all Hospital records sent to his/her office. ? ?In some cases, they will be blood work, cultures and biopsy results pending at the time of your discharge. Please request that your primary care provider follow up on these results. ? ?If you are diabetic, please bring your blood sugar readings with you to your follow up appointment with primary care.   ? ?Please call and make your follow up appointments as soon as possible.   ? ?Also Note  the following: ?If you experience worsening of your admission symptoms, develop shortness of breath, life threatening emergency, suicidal or homicidal thoughts you must seek medical attention immediately by calling 911 or calling your MD immediately  if symptoms less severe. ? ?You must read complete instructions/literature along with all the possible adverse reactions/side effects for all the Medicines you take and that have been prescribed to you. Take any new Medicines after you have completely understood and accpet all the possible adverse reactions/side effects.  ? ?Do not drive when taking Pain medications or sleeping medications (Benzodiazepines) ? ?Do not take more than prescribed Pain, Sleep and Anxiety Medications. It is not advisable to combine anxiety,sleep and pain medications without talking with your primary care practitioner ? ?Special Instructions: If you have smoked or chewed Tobacco  in the last 2 yrs please stop smoking, stop any regular Alcohol  and or any Recreational drug use. ? ?Wear Seat belts while driving.  Do not drive if taking any narcotic, mind altering or controlled substances or recreational drugs or alcohol.  ? ? ? ? ? ?

## 2022-03-04 ENCOUNTER — Telehealth: Payer: Self-pay

## 2022-03-04 ENCOUNTER — Encounter (HOSPITAL_COMMUNITY): Payer: Self-pay | Admitting: Gastroenterology

## 2022-03-04 ENCOUNTER — Other Ambulatory Visit: Payer: Self-pay

## 2022-03-04 DIAGNOSIS — K921 Melena: Secondary | ICD-10-CM

## 2022-03-04 NOTE — Telephone Encounter (Signed)
-----   Message from Daneil Dolin, MD sent at 03/04/2022  3:51 PM EST ----- Regarding: RE: Accurate information Sorry for the delay.  Karen Tate is the patient.  Just make sure she understands there was a benign polyp removed from her colon.  No clips placed she will follow-up with Dr. Abbey Chatters.  Please relay to patient's daughter, Corena Herter ----- Message ----- From: Lodema Hong, CMA Sent: 03/04/2022   1:48 PM EST To: Daneil Dolin, MD Subject: RE: Accurate information                       No patient name was attached to this message, I called the number in the message and the vm is not set up for me to be able to leave a message.  ----- Message ----- From: Daneil Dolin, MD Sent: 03/03/2022   1:08 PM EST To: Lodema Hong, CMA Subject: Accurate information                           Patient hospitalized last couple of days with a GI bleed.  Patient asked about clip when I rounded today.  We talked about clips.  There may have been some confusion.  This lady has had a thorough GI evaluation while here EGD, colonoscopy, capsule study and CTE without significant findings.  I just sent her a letter with the polyp path results.  Please call Corena Herter, her daughter, [phone]. To clarify: To let her know that everything looked good on her studies.  Again, polyp removed from her colon.  Likely bleeding from small blood vessels in her GI tract.  Will just stay on aspirin for now and follow her H&H.  No clips placed.  No other biopsies done.  Thanks.

## 2022-03-04 NOTE — Telephone Encounter (Signed)
-----   Message from Simeon Craft sent at 03/04/2022  9:38 AM EST ----- I have already scheduled her for her appt in the office. ----- Message ----- From: Daneil Dolin, MD Sent: 03/03/2022  12:03 PM EST To: Simeon Craft  Patient needs an appointment in about 3 weeks with either Dr. Abbey Chatters or app.  Hospital follow-up.  Needs H&H just before office visit

## 2022-03-04 NOTE — Telephone Encounter (Signed)
Lab was ordered and will be mailed to the pt to have completed before appt.

## 2022-03-04 NOTE — Telephone Encounter (Signed)
Lmom for pt to return my call.  

## 2022-03-05 NOTE — Telephone Encounter (Signed)
Pt's daughter was made aware and verbalized understanding.

## 2022-03-12 DIAGNOSIS — R296 Repeated falls: Secondary | ICD-10-CM | POA: Diagnosis not present

## 2022-03-12 DIAGNOSIS — I10 Essential (primary) hypertension: Secondary | ICD-10-CM | POA: Diagnosis not present

## 2022-03-12 DIAGNOSIS — K921 Melena: Secondary | ICD-10-CM | POA: Diagnosis not present

## 2022-03-12 DIAGNOSIS — E7849 Other hyperlipidemia: Secondary | ICD-10-CM | POA: Diagnosis not present

## 2022-03-12 DIAGNOSIS — M4807 Spinal stenosis, lumbosacral region: Secondary | ICD-10-CM | POA: Diagnosis not present

## 2022-03-12 DIAGNOSIS — L8990 Pressure ulcer of unspecified site, unspecified stage: Secondary | ICD-10-CM | POA: Diagnosis not present

## 2022-03-12 DIAGNOSIS — G6189 Other inflammatory polyneuropathies: Secondary | ICD-10-CM | POA: Diagnosis not present

## 2022-03-12 DIAGNOSIS — Z6831 Body mass index (BMI) 31.0-31.9, adult: Secondary | ICD-10-CM | POA: Diagnosis not present

## 2022-03-12 DIAGNOSIS — E1143 Type 2 diabetes mellitus with diabetic autonomic (poly)neuropathy: Secondary | ICD-10-CM | POA: Diagnosis not present

## 2022-03-15 ENCOUNTER — Other Ambulatory Visit: Payer: Self-pay

## 2022-03-15 DIAGNOSIS — U071 COVID-19: Secondary | ICD-10-CM | POA: Diagnosis not present

## 2022-03-15 DIAGNOSIS — J441 Chronic obstructive pulmonary disease with (acute) exacerbation: Secondary | ICD-10-CM | POA: Diagnosis not present

## 2022-03-15 DIAGNOSIS — K921 Melena: Secondary | ICD-10-CM

## 2022-03-27 ENCOUNTER — Inpatient Hospital Stay: Payer: Medicare Other | Attending: Hematology | Admitting: Nurse Practitioner

## 2022-03-27 ENCOUNTER — Encounter: Payer: Self-pay | Admitting: Nurse Practitioner

## 2022-03-27 ENCOUNTER — Encounter (HOSPITAL_COMMUNITY): Payer: Self-pay | Admitting: Hematology

## 2022-03-27 VITALS — BP 139/65 | HR 71 | Ht 65.0 in | Wt 166.2 lb

## 2022-03-27 DIAGNOSIS — Z79899 Other long term (current) drug therapy: Secondary | ICD-10-CM

## 2022-03-27 DIAGNOSIS — I5032 Chronic diastolic (congestive) heart failure: Secondary | ICD-10-CM | POA: Diagnosis not present

## 2022-03-27 DIAGNOSIS — R531 Weakness: Secondary | ICD-10-CM | POA: Diagnosis not present

## 2022-03-27 DIAGNOSIS — I6523 Occlusion and stenosis of bilateral carotid arteries: Secondary | ICD-10-CM

## 2022-03-27 DIAGNOSIS — I272 Pulmonary hypertension, unspecified: Secondary | ICD-10-CM | POA: Diagnosis not present

## 2022-03-27 DIAGNOSIS — R55 Syncope and collapse: Secondary | ICD-10-CM

## 2022-03-27 DIAGNOSIS — I2510 Atherosclerotic heart disease of native coronary artery without angina pectoris: Secondary | ICD-10-CM

## 2022-03-27 DIAGNOSIS — E785 Hyperlipidemia, unspecified: Secondary | ICD-10-CM

## 2022-03-27 DIAGNOSIS — I10 Essential (primary) hypertension: Secondary | ICD-10-CM

## 2022-03-27 DIAGNOSIS — I4891 Unspecified atrial fibrillation: Secondary | ICD-10-CM

## 2022-03-27 DIAGNOSIS — D649 Anemia, unspecified: Secondary | ICD-10-CM | POA: Diagnosis not present

## 2022-03-27 DIAGNOSIS — I361 Nonrheumatic tricuspid (valve) insufficiency: Secondary | ICD-10-CM

## 2022-03-27 DIAGNOSIS — R296 Repeated falls: Secondary | ICD-10-CM | POA: Diagnosis not present

## 2022-03-27 NOTE — Patient Instructions (Addendum)
Medication Instructions:  Your physician recommends that you continue on your current medications as directed. Please refer to the Current Medication list given to you today.  *If you need a refill on your cardiac medications before your next appointment, please call your pharmacy*   Lab Work: Your physician recommends that you return for lab work in: Today   If you have labs (blood work) drawn today and your tests are completely normal, you will receive your results only by: MyChart Message (if you have MyChart) OR A paper copy in the mail If you have any lab test that is abnormal or we need to change your treatment, we will call you to review the results.   Testing/Procedures: Your physician has requested that you have a carotid duplex. This test is an ultrasound of the carotid arteries in your neck. It looks at blood flow through these arteries that supply the brain with blood. Allow one hour for this exam. There are no restrictions or special instructions.    Follow-Up: At [facility], you and your health needs are our priority.  As part of our continuing mission to provide you with exceptional heart care, we have created designated Provider Care Teams.  These Care Teams include your primary Cardiologist (physician) and Advanced Practice Providers (APPs -  Physician Assistants and Nurse Practitioners) who all work together to provide you with the care you need, when you need it.  We recommend signing up for the patient portal called "MyChart".  Sign up information is provided on this After Visit Summary.  MyChart is used to connect with patients for Virtual Visits (Telemedicine).  Patients are able to view lab/test results, encounter notes, upcoming appointments, etc.  Non-urgent messages can be sent to your provider as well.   To learn more about what you can do with MyChart, go to [email or web address].    Your next appointment:   2 -3 month(s)  The format for your next  appointment:   In Person  Provider:   Finis Bud, NP    Other Instructions Thank you for choosing [place]!    Important Information About Sugar     Blood Pressure Record Sheet To take your blood pressure, you will need a blood pressure machine. You may be prescribed one, or you can buy a blood pressure machine (blood pressure monitor) at your clinic, drug store, or online. When choosing one, look for these features: An automatic monitor that has an arm cuff. A cuff that wraps snugly, but not too tightly, around your upper arm. You should be able to fit only one finger between your arm and the cuff. A device that stores blood pressure reading results. Do not choose a monitor that measures your blood pressure from your wrist or finger. Follow your health care provider's instructions for how to take your blood pressure. To use this form: Get one reading in the morning (a.m.) before you take any medicines. Get one reading in the evening (p.m.) before supper. Take at least two readings with each blood pressure check. This makes sure the results are correct. Wait 1-2 minutes between measurements. Write down the results in the spaces on this form. Repeat this once a week, or as told by your health care provider. Make a follow-up appointment with your health care provider to discuss the results. Blood pressure log Date: _______________________ a.m. _____________________(1st reading) _____________________(2nd reading) p.m. _____________________(1st reading) _____________________(2nd reading) Date: _______________________ a.m. _____________________(1st reading) _____________________(2nd reading) p.m. _____________________(1st reading) _____________________(2nd reading) Date:  _______________________ a.m. _____________________(1st reading) _____________________(2nd reading) p.m. _____________________(1st reading) _____________________(2nd reading) Date:  _______________________ a.m. _____________________(1st reading) _____________________(2nd reading) p.m. _____________________(1st reading) _____________________(2nd reading) Date: _______________________ a.m. _____________________(1st reading) _____________________(2nd reading) p.m. _____________________(1st reading) _____________________(2nd reading) This information is not intended to replace advice given to you by your health care provider. Make sure you discuss any questions you have with your health care provider. Document Revised: 11/23/2020 Document Reviewed: 11/23/2020 Elsevier Patient Education  [place].  Daily Weight Record It is important to weigh yourself daily. To do this: Make sure you use a reliable scale. Use the same scale each day. Keep this daily weight chart near your scale. Weigh yourself each morning at the same time after you use the bathroom. Before weighing yourself: Take off your shoes. Make sure you are wearing the same amount of clothing each day. Write down your weight in the spaces on the form. Compare today's weight to yesterday's weight. Bring this form with you to your follow-up visits with your health care provider. Call your health care provider if you have concerns about your weight, including rapid weight gain or loss. Date: ________ Weight: ____________________ Date: ________ Weight: ____________________ Date: ________ Weight: ____________________ Date: ________ Weight: ____________________ Date: ________ Weight: ____________________ Date: ________ Weight: ____________________ Date: ________ Weight: ____________________ Date: ________ Weight: ____________________ Date: ________ Weight: ____________________ Date: ________ Weight: ____________________ Date: ________ Weight: ____________________ Date: ________ Weight: ____________________ Date: ________ Weight: ____________________ Date: ________ Weight: ____________________ Date: ________  Weight: ____________________ Date: ________ Weight: ____________________ Date: ________ Weight: ____________________ Date: ________ Weight: ____________________ Date: ________ Weight: ____________________ Date: ________ Weight: ____________________ Date: ________ Weight: ____________________ Date: ________ Weight: ____________________ Date: ________ Weight: ____________________ Date: ________ Weight: ____________________ Date: ________ Weight: ____________________ Date: ________ Weight: ____________________ Date: ________ Weight: ____________________ Date: ________ Weight: ____________________ Date: ________ Weight: ____________________ Date: ________ Weight: ____________________ Date: ________ Weight: ____________________ Date: ________ Weight: ____________________ Date: ________ Weight: ____________________ Date: ________ Weight: ____________________ Date: ________ Weight: ____________________ Date: ________ Weight: ____________________ Date: ________ Weight: ____________________ Date: ________ Weight: ____________________ Date: ________ Weight: ____________________ Date: ________ Weight: ____________________ Date: ________ Weight: ____________________ Date: ________ Weight: ____________________ Date: ________ Weight: ____________________ Date: ________ Weight: ____________________ Date: ________ Weight: ____________________ Date: ________ Weight: ____________________ Date: ________ Weight: ____________________ Date: ________ Weight: ____________________ Date: ________ Weight: ____________________ Date: ________ Weight: ____________________ This information is not intended to replace advice given to you by your health care provider. Make sure you discuss any questions you have with your health care provider. Document Revised: 11/14/2020 Document Reviewed: 11/14/2020 Elsevier Patient Education  [place].

## 2022-03-27 NOTE — Progress Notes (Signed)
Cardiology Office Note:    Date:  03/27/2022  ID:  Karen Tate, DOB 10-09-1945, MRN [id number]  PCP:  Neale Burly, MD   [facility] Cardiologist:  Carlyle Dolly, MD     Referring MD: Neale Burly, MD   CC: Here for hospital follow-up  History of Present Illness:    Karen Tate is a 77 y.o. female with a hx of the following:  CAD, status post CABG x 3 and NSTEMI Carotid artery disease Chronic diastolic CHF MR History of pulmonary embolus A-fib Hypertension History of lung cancer, s/p RUL lobectomy COPD (followed by pulmonology) Pulmonary HTN Hx of GI bleed, anemia Type 2 diabetes  Patient is a 78 year old female with past medical history as mentioned above.  History of remote CABG in 2019 LIMA to LAD, sequential saphenous vein graft to RI branches wanted to.  Presented with an NSTEMI, echocardiogram in 2020 revealed normal EF.  Most recent carotid ultrasound revealed 1 to 39% stenosis along right ICA, 40 to 59% stenosis along left ICA.  Was admitted in 2022 for shortness of breath, was thought to be volume overloaded and diuresed.  Echocardiogram in 2022 revealed normal EF, grade 2 DD, mild RV dysfunction, PASP 61 mmHg.  In May 2023 she had an ER visit for mild pulmonary edema.  BNP 315.  Trops negative. EKG showed A-fib, rate controlled, nonspecific ST/T wave abnormality.  Received IV Lasix, Flexeril, and Tylenol in the ED.  Was discharged later in stable condition.  Was admitted in December 2023 for chief complaint of weakness and syncope.  Episode lasted 2 minutes, no postictal symptoms, no seizure-like activity.  Patient stated she had increasing shortness of breath and dyspnea on exertion for past 2 months, worse in past 2 to 3 weeks.  Denied any flulike symptoms, chest pain, hemoptysis, or coughing.  She did have some slight signs of fluid overload.  Cardiology was following.  She received IV Lasix.  Repeat echocardiogram revealed normal EF, no  RWMA, RV overload, moderate to severe TR.  A-fib with heart rate from 40-60, Coreg was held due to severe bradycardia.  Anticoagulation was held due to melena/acute blood loss anemia.  Syncopal episode was likely secondary to blood loss anemia with drop in hemoglobin.  She was given blood transfusion.  Underwent EGD that showed normal stomach/duodenum, hiatal hernia, colonoscopy did not reveal any source of bleeding found.  Underwent CT enteroscopy.  Small adenoma was removed by colon, prominent vascular pattern small bowel without suspect bleeding lesion being seen VCE.  CTE enteroscopy demonstrated no evidence of small bowel tumor.  GI team suspected she most likely had blood loss from small bowel related to vascular lesion such as a dieulafoy.  GI recommended that she may continue baby aspirin.  Was hyperkalemic during hospital, given [facility] and this resolved potassium level.  Lisinopril was held due to hyperkalemia.  Carotid Doppler revealed less than 50% right ICA. stenosis, left ICA 70%.  Was discharged in stable condition on March 03, 2022.  Today she presents for follow-up with husband.  She states she has been doing well. Denies any syncopal episodes episodes since hospital d/c. Husband states breathing has improved. Admits to stable, chronic DOE. Wears 2 liters of oxygen at home with exertional activities. Sees a pulmonologist regularly. Denies any chest pain, palpitations, orthopnea, PND, significant weight changes, acute bleeding, dizziness, lightheadedness, or claudication.  Denies any other questions or concerns.   Past Medical History:  Diagnosis Date  Anxiety neurosis    Arthritis    Asthma    CAD (coronary artery disease)    a. 08/2017: abnormal nuc-> sent directly to [facility], NSTEMI, s/p CABG (left internal mammary artery to left anterior descending, sequential saphenous vein graft to ramus intermedius branches 1 and 2).   Carotid artery disease (HCC)    Chronic anemia    Dizziness     GERD (gastroesophageal reflux disease)    Glaucoma    Heart disease    Hiatal hernia    Hyperlipemia    Hypertension    Ischemic cardiomyopathy    Joint pain    Mitral regurgitation    Postoperative atrial fibrillation ([place]) 08/2017   Pulmonary embolus (HCC)    Type 2 diabetes mellitus ([place])     Past Surgical History:  Procedure Laterality Date   BACK SURGERY  2016   COLONOSCOPY WITH PROPOFOL N/A 02/27/2022   Procedure: COLONOSCOPY WITH PROPOFOL;  Surgeon: Daneil Dolin, MD;  [facility];  Service: Endoscopy;  Laterality: N/A;   CORONARY ARTERY BYPASS GRAFT N/A 09/18/2017   Procedure: CORONARY ARTERY BYPASS GRAFTING (CABG);  Surgeon: [name], MD;  Location: [facility];  Service: Open Heart Surgery;  Laterality: N/A;  Saphenous vein harvest. LIMA to LAD Saphenous vein (sequential) ramus 1 & 2   ESOPHAGOGASTRODUODENOSCOPY (EGD) WITH PROPOFOL N/A 02/26/2022   Procedure: ESOPHAGOGASTRODUODENOSCOPY (EGD) WITH PROPOFOL;  Surgeon: Harvel Quale, MD;  [facility];  Service: Gastroenterology;  Laterality: N/A;   EYE SURGERY Bilateral    "surgery for glaucoma"   GIVENS CAPSULE STUDY N/A 02/27/2022   Procedure: GIVENS CAPSULE STUDY;  Surgeon: Daneil Dolin, MD;  [facility];  Service: Endoscopy;  Laterality: N/A;  TO BE DEPLOYED AT [place]     INTERCOSTAL NERVE BLOCK Right 05/13/2019   Procedure: Intercostal Nerve Block;  Surgeon: [name], MD;  Location: [facility];  Service: Thoracic;  Laterality: Right;   LEFT HEART CATH AND CORONARY ANGIOGRAPHY N/A 09/15/2017   Procedure: LEFT HEART CATH AND CORONARY ANGIOGRAPHY;  Surgeon: Lorretta Harp, MD;  Location: [facility];  Service: Cardiovascular;  Laterality: N/A;   LYMPH NODE DISSECTION Right 05/13/2019   Procedure: Lymph Node Dissection;  Surgeon: [name], MD;  Location: [facility];  Service: Thoracic;  Laterality: Right;   POLYPECTOMY   02/27/2022   Procedure: POLYPECTOMY;  Surgeon: Daneil Dolin, MD;  [facility];  Service: Endoscopy;;   ROTATOR CUFF REPAIR Left    x2   SHOULDER SURGERY     LEFT   TEE WITHOUT CARDIOVERSION N/A 09/18/2017   Procedure: TRANSESOPHAGEAL ECHOCARDIOGRAM (TEE);  Surgeon: [name], MD;  Location: [facility];  Service: Open Heart Surgery;  Laterality: N/A;   TOTAL KNEE ARTHROPLASTY     LEFT   VAGINAL HYSTERECTOMY     partial    Current Medications: Current Meds  Medication Sig   acetaminophen (TYLENOL) 500 MG tablet Take 1,000 mg by mouth every 6 (six) hours as needed for moderate pain.   albuterol (VENTOLIN HFA) 108 (90 Base) MCG/ACT inhaler Inhale 2 puffs into the lungs every 6 (six) hours as needed for wheezing or shortness of breath.   ALPRAZolam (XANAX) 0.5 MG tablet Take 0.5 mg by mouth 2 (two) times daily.   aspirin EC 81 MG tablet Take 1 tablet (81 mg total) by mouth daily. Swallow whole.   fish oil-omega-3 fatty acids 1000  MG capsule Take 1 g by mouth 3 (three) times daily.    fluticasone-salmeterol (ADVAIR) 250-50 MCG/ACT AEPB Inhale 1 puff into the lungs in the morning and at bedtime. Advair 250/50 one puff 2 times/ day   folic acid (FOLVITE) 1 MG tablet TAKE 1 TABLET EVERY DAY (Patient taking differently: Take 1 mg by mouth daily.)   furosemide (LASIX) 20 MG tablet Take 2 tablets (40 mg total) by mouth in the morning.   magnesium oxide (MAG-OX) 400 MG tablet Take 1 tablet (400 mg total) by mouth daily.   meclizine (ANTIVERT) 25 MG tablet Take 25 mg by mouth 3 (three) times daily as needed for dizziness.    omeprazole (PRILOSEC) 40 MG capsule Take 40 mg by mouth daily.   potassium chloride SA (KLOR-CON M) 20 MEQ tablet Take 1 tablet (20 mEq total) by mouth daily.     Allergies:   Beta adrenergic blockers, Calcium channel blockers, and Naproxen   Social History   Socioeconomic History   Marital status: Married    Spouse name: [name]   Number of children: 5    Years of education: Not on file   Highest education level: Not on file  Occupational History   Not on file  Tobacco Use   Smoking status: Former    Packs/day: 0.50    Years: 35.00    Total pack years: 17.50    Types: Cigarettes    Quit date: 03/25/1978    Years since quitting: 44.0    Passive exposure: Never   Smokeless tobacco: Never   Tobacco comments:    quit more than 30 years ago  Vaping Use   Vaping Use: Never used  Substance and Sexual Activity   Alcohol use: No   Drug use: No   Sexual activity: Not on file  Other Topics Concern   Not on file  Social History Narrative   Not on file   Social Determinants of Health   Financial Resource Strain: Low Risk  (06/29/2019)   Overall Financial Resource Strain (CARDIA)    Difficulty of Paying Living Expenses: Not hard at all  Food Insecurity: No Food Insecurity (02/25/2022)   Hunger Vital Sign    Worried About Running Out of Food in the Last Year: Never true    Ran Out of Food in the Last Year: Never true  Transportation Needs: No Transportation Needs (02/25/2022)   PRAPARE - Hydrologist (Medical): No    Lack of Transportation (Non-Medical): No  Physical Activity: Inactive (06/29/2019)   Exercise Vital Sign    Days of Exercise per Week: 0 days    Minutes of Exercise per Session: 0 min  Stress: No Stress Concern Present (06/29/2019)   [place]    Feeling of Stress : Not at all  Social Connections: Moderately Integrated (06/29/2019)   Social Connection and Isolation Panel [NHANES]    Frequency of Communication with Friends and Family: More than three times a week    Frequency of Social Gatherings with Friends and Family: More than three times a week    Attends Religious Services: More than 4 times per year    Active Member of [employer] or Organizations: No    Attends Archivist Meetings: Never    Marital Status: Married      Family History: The patient's family history includes Anemia in her daughter; Bladder Cancer in her son; Breast cancer in her mother; Congestive  Heart Failure in her brother, son, and son; Diabetes in her daughter and father; Heart disease in her father; Hypertension in her daughter; Migraines in her daughter; Seizures in her daughter. There is no history of Colon cancer or Colon polyps.  ROS:   Review of Systems  Constitutional: Negative.   HENT: Negative.    Eyes: Negative.   Respiratory:  Positive for shortness of breath. Negative for cough, hemoptysis, sputum production and wheezing.        See HPI.   Cardiovascular: Negative.   Gastrointestinal: Negative.   Genitourinary: Negative.   Musculoskeletal: Negative.   Skin: Negative.   Neurological: Negative.   Endo/Heme/Allergies: Negative.   Psychiatric/Behavioral: Negative.      Please see the history of present illness.    All other systems reviewed and are negative.  EKGs/Labs/Other Studies Reviewed:    The following studies were reviewed today:   EKG:  EKG is not ordered today.   Echocardiogram on 02/25/2022: 1. Left ventricular ejection fraction, by estimation, is 65 to 70%. The  left ventricle has normal function. The left ventricle has no regional  wall motion abnormalities. Left ventricular diastolic parameters are  indeterminate.   2. Ventricular septum is flattnened in systole and diastole suggesting RV  pressure and volume overload. RV not well visualized, grossly appears  enlarged with decreased systolic function. . Right ventricular systolic  function was not well visualized. The   right ventricular size is not well visualized. There is severely elevated  pulmonary artery systolic pressure.   3. Left atrial size was mildly dilated.   4. The mitral valve is abnormal. Mild mitral valve regurgitation. No  evidence of mitral stenosis. Moderate mitral annular calcification.   5. Moderate to severe TR, hepatic  systolic flow reversal would suggest  severe TR. . Tricuspid valve regurgitation is moderate. 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   6. The aortic valve is tricuspid. There is moderate calcification of the  aortic valve. There is moderate thickening of the aortic valve. Aortic  valve regurgitation is not visualized. No aortic stenosis is present.   7. The pulmonic valve was abnormal.   8. The inferior vena cava is dilated in size with <50% respiratory  variability, suggesting right atrial pressure of 15 mmHg.  Carotid duplex on 02/25/2022: IMPRESSION: 1. 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 2. Less than 50% stenosis of the right internal carotid artery.  TEE on 09/18/2017:  Aortic valve: The valve is trileaflet. Mild valve thickening present.  Mild valve calcification present. No stenosis. No regurgitation.   Mitral valve: Mild regurgitation with central jet.   Right ventricle: Normal wall thickness and ejection fraction. Cavity is  mildly dilated.   Tricuspid valve: Trace regurgitation. The tricuspid valve regurgitation  jet is central.   Left Atrial Appendage: Not well visualized   Pulmonic valve: No regurgitation.   Left ventricle: Estimated EF 35-40%. Severe hypokinesis of the anterior,  anterolateral and inferior wall. Post bypass assessment: Tricuspid, Pulmonic, Mitral and Aortic valves unchanged. LVEF unchanged to  slightly improved. CO 3.5L with vasopressor support. Anterior segment of  LV shows improved function and is slightly hyperdynamic. Inferior segment  of LV slightly improved. Lateral wall remains hypokinetic and unchanged.  Aortic cannula removed, no dissection present.   Left heart cath on 09/15/2017: Ms. Meigs has severe disease in her proximal LAD, mid ramus branch which is large and calcified and AV groove  circumflex which is nondominant. I  do not think that her ramus branch which subtends a large area of myocardium is percutaneously addressable because of its calcified nature and tortuosity. The best option would be coronary artery bypass grafting. Her LVEDP was 31. Her LVEF was in the 30% range. The sheath was removed and a TR band was placed on the right wrist to achieve patent hemostasis. The patient left the lab in stable condition. TCT S will be notified.   Recent Labs: 02/25/2022: TSH 1.729 02/26/2022: ALT 11 03/01/2022: B Natriuretic Peptide 1,093.0; Magnesium 1.8 03/03/2022: BUN 12; Creatinine, Ser 1.00; Hemoglobin 9.3; Platelets 181; Potassium 3.7; Sodium 141  Recent Lipid Panel    Component Value Date/Time   CHOL 152 09/13/2017 0423   TRIG 209 (H) 09/13/2017 0423   HDL 35 (L) 09/13/2017 0423   CHOLHDL 4.3 09/13/2017 0423   VLDL 42 (H) 09/13/2017 0423   LDLCALC 75 09/13/2017 0423     Risk Assessment/Calculations:    CHA2DS2-VASc Score = 6  This indicates a 9.7% annual risk of stroke. The patient's score is based upon: CHF History: 1 HTN History: 1 Diabetes History: 0 Stroke History: 0 Vascular Disease History: 1 Age Score: 2 Gender Score: 1     Physical Exam:    VS:  BP 139/65 (BP Location: Left Arm, Patient Position: Sitting, Cuff Size: Normal)   Pulse 71   Ht 5\' 5"  (1.651 m)   Wt 166 lb 3.2 oz (75.4 kg)   SpO2 97%   BMI 27.66 kg/m     Wt Readings from Last 3 Encounters:  03/27/22 166 lb 3.2 oz (75.4 kg)  03/03/22 166 lb 10.7 oz (75.6 kg)  01/11/22 177 lb 9.6 oz (80.6 kg)     GEN: Well nourished, well developed in no acute distress HEENT: Normal NECK: No JVD; No carotid bruits CARDIAC: S1/S2, irregular rhythm and regular rate, no murmurs, rubs, gallops; 2+ pulses throughout RESPIRATORY:  Clear to auscultation without rales, wheezing or rhonchi  MUSCULOSKELETAL:  No edema; No deformity  SKIN: Warm and dry NEUROLOGIC:  Alert and oriented x 3 PSYCHIATRIC:  Normal affect   ASSESSMENT:     1. Chronic heart failure with preserved ejection fraction ([place])   2. Atrial fibrillation, unspecified type ([place])   3. Coronary artery disease involving native coronary artery of native heart without angina pectoris   4. Hyperlipidemia, unspecified hyperlipidemia type   5. Carotid stenosis, bilateral   6. Pulmonary hypertension, unspecified ([place])   7. Nonrheumatic tricuspid valve regurgitation   8. Hypertension, unspecified type   9. Medication management   10. Syncope and collapse   11. Anemia, unspecified type    PLAN:    In order of problems listed above:  Chronic diastolic CHF, HFpEF Echo on 02/25/2022 revealed EF 65 to 70%, no RWMA.  Ventricular septum was flattened in systole and diastole suggesting RV pressure and volume overload, most likely d/t pulmonary hypertension (see below).  RV was not well-visualized, it grossly appeared enlarged with decreased systolic function.  Severely elevated PASP was noted. Euvolemic and well compensated on exam.  Breathing has improved.  Continue Lasix and potassium. Low sodium diet, fluid restriction <2L, and daily weights encouraged. Educated to contact our office for weight gain of 2 lbs overnight or 5 lbs in one week. Heart healthy diet and regular cardiovascular exercise encouraged.   A-fib Rate controlled on exam today.  Denies any tachycardia or palpitations.  Was unable to tolerate carvedilol due to severe bradycardia.  Dr. Carlyle Dolly who saw her in the hospital stated would not add back AV nodal agent at this time.  Not a anticoagulation candidate due to past history of GI bleed. Heart healthy diet and regular cardiovascular exercise encouraged. Continue ASA.   CAD, status post CABG x 3 and NSTEMI, HLD Stable with no anginal symptoms. No indication for ischemic evaluation. Continue current medication regimen.  No recent lipid panel on file - PCP to manage. At next office visit, if no recent labwork on file, plan to request from PCP.  Heart healthy diet and regular cardiovascular exercise encouraged.   Carotid disease Carotid Doppler performed on February 25, 2022 revealed moderate shadowing calcified plaque noted at the left carotid bifurcation with elevated peak systolic velocity suggesting greater than 70% stenosis, right ICA demonstrated less than 50% stenosis.  Denies any symptoms.  Denies any recurrent syncope.  Plan to repeat carotid Doppler in 1 year and plan to request FLP from PCP if labs not available by next OV. Heart healthy diet and regular cardiovascular exercise encouraged.   5. Pulmonary HTN, tricuspid valve regurgitation Echo 02/2022 revealed indeterminant diastolic function, RV pressure volume overload, decreased RV systolic function, severe pulmonary hypertension, moderate to severe TR, dilated IVC.  Most likely secondary due to chronic O2 dependent lung disease with left-sided heart disease.  Dr. Carlyle Dolly stated that the possibility vasodilators having a role will be quite small and suggested not to pursue further testing at this time.  Continue to follow-up with pulmonology.  Plan to repeat echocardiogram in 1 year or sooner if anything changes due to valvular insufficiency.  HTN BP on arrival 144/66, repeat BP 139/65. BP well controlled at home.  Continue amlodipine.  Continue rest of current medication regimen. Will obtain BMET. Heart healthy diet and regular cardiovascular exercise encouraged.   Syncope, blood loss anemia Syncopal episode was felt to be secondary to blood loss anemia. Denies any bleeding issues. Carvedilol d/c d/t bradycardia. Denies any recurrent episodes of syncope. Heart healthy diet and regular cardiovascular exercise encouraged. Will obtain CBC.   7. Disposition: Follow up with me in 2-3 months or sooner if anything changes. Discussed with patient to check medications when she gets home to let us know what she is taking.       Medication Adjustments/Labs and Tests  Ordered: Current medicines are reviewed at length with the patient today.  Concerns regarding medicines are outlined above.  Orders Placed This Encounter  Procedures   CBC   Basic metabolic panel   VAS US CAROTID   No orders of the defined types were placed in this encounter.   Patient Instructions  Medication Instructions:  Your physician recommends that you continue on your current medications as directed. Please refer to the Current Medication list given to you today.  *If you need a refill on your cardiac medications before your next appointment, please call your pharmacy*   Lab Work: Your physician recommends that you return for lab work in: Today   If you have labs (blood work) drawn today and your tests are completely normal, you will receive your results only by: MyChart Message (if you have MyChart) OR A paper copy in the mail If you have any lab test that is abnormal or we need to change your treatment, we will call you to review the results.   Testing/Procedures: Your physician has requested that you have a carotid duplex. This test is an ultrasound of the carotid arteries in your neck. It looks  at blood flow through these arteries that supply the brain with blood. Allow one hour for this exam. There are no restrictions or special instructions.    Follow-Up: At [facility], you and your health needs are our priority.  As part of our continuing mission to provide you with exceptional heart care, we have created designated Provider Care Teams.  These Care Teams include your primary Cardiologist (physician) and Advanced Practice Providers (APPs -  Physician Assistants and Nurse Practitioners) who all work together to provide you with the care you need, when you need it.  We recommend signing up for the patient portal called "MyChart".  Sign up information is provided on this After Visit Summary.  MyChart is used to connect with patients for Virtual Visits  (Telemedicine).  Patients are able to view lab/test results, encounter notes, upcoming appointments, etc.  Non-urgent messages can be sent to your provider as well.   To learn more about what you can do with MyChart, go to [email or web address].    Your next appointment:   2 -3 month(s)  The format for your next appointment:   In Person  Provider:   Finis Bud, NP    Other Instructions Thank you for choosing [facility]!    Important Information About Sugar     Blood Pressure Record Sheet To take your blood pressure, you will need a blood pressure machine. You may be prescribed one, or you can buy a blood pressure machine (blood pressure monitor) at your clinic, drug store, or online. When choosing one, look for these features: An automatic monitor that has an arm cuff. A cuff that wraps snugly, but not too tightly, around your upper arm. You should be able to fit only one finger between your arm and the cuff. A device that stores blood pressure reading results. Do not choose a monitor that measures your blood pressure from your wrist or finger. Follow your health care provider's instructions for how to take your blood pressure. To use this form: Get one reading in the morning (a.m.) before you take any medicines. Get one reading in the evening (p.m.) before supper. Take at least two readings with each blood pressure check. This makes sure the results are correct. Wait 1-2 minutes between measurements. Write down the results in the spaces on this form. Repeat this once a week, or as told by your health care provider. Make a follow-up appointment with your health care provider to discuss the results. Blood pressure log Date: _______________________ a.m. _____________________(1st reading) _____________________(2nd reading) p.m. _____________________(1st reading) _____________________(2nd reading) Date: _______________________ a.m. _____________________(1st  reading) _____________________(2nd reading) p.m. _____________________(1st reading) _____________________(2nd reading) Date: _______________________ a.m. _____________________(1st reading) _____________________(2nd reading) p.m. _____________________(1st reading) _____________________(2nd reading) Date: _______________________ a.m. _____________________(1st reading) _____________________(2nd reading) p.m. _____________________(1st reading) _____________________(2nd reading) Date: _______________________ a.m. _____________________(1st reading) _____________________(2nd reading) p.m. _____________________(1st reading) _____________________(2nd reading) This information is not intended to replace advice given to you by your health care provider. Make sure you discuss any questions you have with your health care provider. Document Revised: 11/23/2020 Document Reviewed: 11/23/2020 Elsevier Patient Education  .  Daily Weight Record It is important to weigh yourself daily. To do this: Make sure you use a reliable scale. Use the same scale each day. Keep this daily weight chart near your scale. Weigh yourself each morning at the same time after you use the bathroom. Before weighing yourself: Take off your shoes. Make sure you are wearing the same amount of clothing each day. Write  down your weight in the spaces on the form. Compare today's weight to yesterday's weight. Bring this form with you to your follow-up visits with your health care provider. Call your health care provider if you have concerns about your weight, including rapid weight gain or loss. Date: ________ Weight: ____________________ Date: ________ Weight: ____________________ Date: ________ Weight: ____________________ Date: ________ Weight: ____________________ Date: ________ Weight: ____________________ Date: ________ Weight: ____________________ Date: ________ Weight: ____________________ Date: ________  Weight: ____________________ Date: ________ Weight: ____________________ Date: ________ Weight: ____________________ Date: ________ Weight: ____________________ Date: ________ Weight: ____________________ Date: ________ Weight: ____________________ Date: ________ Weight: ____________________ Date: ________ Weight: ____________________ Date: ________ Weight: ____________________ Date: ________ Weight: ____________________ Date: ________ Weight: ____________________ Date: ________ Weight: ____________________ Date: ________ Weight: ____________________ Date: ________ Weight: ____________________ Date: ________ Weight: ____________________ Date: ________ Weight: ____________________ Date: ________ Weight: ____________________ Date: ________ Weight: ____________________ Date: ________ Weight: ____________________ Date: ________ Weight: ____________________ Date: ________ Weight: ____________________ Date: ________ Weight: ____________________ Date: ________ Weight: ____________________ Date: ________ Weight: ____________________ Date: ________ Weight: ____________________ Date: ________ Weight: ____________________ Date: ________ Weight: ____________________ Date: ________ Weight: ____________________ Date: ________ Weight: ____________________ Date: ________ Weight: ____________________ Date: ________ Weight: ____________________ Date: ________ Weight: ____________________ Date: ________ Weight: ____________________ Date: ________ Weight: ____________________ Date: ________ Weight: ____________________ Date: ________ Weight: ____________________ Date: ________ Weight: ____________________ Date: ________ Weight: ____________________ Date: ________ Weight: ____________________ Date: ________ Weight: ____________________ Date: ________ Weight: ____________________ Date: ________ Weight: ____________________ Date: ________ Weight: ____________________ This information is not intended to replace advice  given to you by your health care provider. Make sure you discuss any questions you have with your health care provider. Document Revised: 11/14/2020 Document Reviewed: 11/14/2020 Elsevier Patient Education  [place], Finis Bud, NP  03/29/2022 1:25 PM    Liborio Negron Torres

## 2022-03-29 ENCOUNTER — Encounter: Payer: Self-pay | Admitting: Nurse Practitioner

## 2022-04-01 ENCOUNTER — Inpatient Hospital Stay: Payer: PPO | Admitting: Gastroenterology

## 2022-04-01 NOTE — Progress Notes (Deleted)
GI Office Note    Referring Provider: Toma Deiters, MD Primary Care Physician:  Toma Deiters, MD  Primary Gastroenterologist: Hennie Duos. Marletta Lor, DO  Chief Complaint   No chief complaint on file.   History of Present Illness   Karen Tate is a 77 y.o. female presenting today at the request of Hasanaj, Myra Gianotti, MD for hospital follow-up due to GI bleed.  Hospitalized 02/24/2022-03/03/2022.  Presented to the ED with shortness of breath and dyspnea on exertion for several months, worsening over the past few weeks.  Hemoglobin 7.4 on admission.  Previously was 10.7 several months prior.  Stool heme positive.  Ferritin 281, iron 36, BNP 459.  A-fib with slow ventricular response.  Syncopal episode suspected to be vasovagal response.  Reported dark/black stools for few weeks, only taking 81 mg ASA.  States she wears 2 L nasal cannula continuously at home.  Reported history of EGD and colonoscopy in 2020 by Dr. Marcha Solders in [place] and [place].  Negative H. pylori.  Received 2 units PBC during hospital admission.  EGD, TCS, and capsule endoscopy performed stated below.  Bleeding suspected to be secondary to a vascular lesion such as [place]oy.  PPI daily recommended.  May continue ASA 81 mg daily.  EGD 02/26/2022: -3 cm hiatal hernia -GE flap Hill grade 3 -Normal stomach -Normal duodenum -Monitor H/H, proceed with colonoscopy  Colonoscopy 02/27/2022: -6 mg polyp in descending colon -Markedly redundant colon -Givens capsule ordered.  Capsule endoscopy 02/28/2022: -Single nonbleeding AVM at 3 hours 59 minutes 30 seconds into study. -Area of concern in distal small bowel 4 hours, 45 minutes, 29 seconds into exam -CT enterography ordered  CT enterography: -Under distended small bowel -Colon fluid-filled to the rectum (consistent with diarrheal illness) -Metallic foreign body in the posterior distal rectum of uncertain nature, possibly endoscopic capsule -Extensive heterogeneous and  consolidative airspace opacity throughout the lung consistent with infection or inflammation -Anasarca  Hgb 9.3 on discharge 03/03/2022.  Hemoglobin 10.3 on 03/12/2022.   Today:   Current Outpatient Medications  Medication Sig Dispense Refill   acetaminophen (TYLENOL) 500 MG tablet Take 1,000 mg by mouth every 6 (six) hours as needed for moderate pain.     albuterol (VENTOLIN HFA) 108 (90 Base) MCG/ACT inhaler Inhale 2 puffs into the lungs every 6 (six) hours as needed for wheezing or shortness of breath.     ALPRAZolam (XANAX) 0.5 MG tablet Take 0.5 mg by mouth 2 (two) times daily.     amLODipine (NORVASC) 5 MG tablet Take 1 tablet (5 mg total) by mouth daily. 30 tablet 1   aspirin EC 81 MG tablet Take 1 tablet (81 mg total) by mouth daily. Swallow whole. 30 tablet 12   atorvastatin (LIPITOR) 80 MG tablet Take 1 tablet (80 mg total) by mouth daily at 6 PM. 90 tablet 3   fish oil-omega-3 fatty acids 1000 MG capsule Take 1 g by mouth 3 (three) times daily.      fluticasone-salmeterol (ADVAIR) 250-50 MCG/ACT AEPB Inhale 1 puff into the lungs in the morning and at bedtime. Advair 250/50 one puff 2 times/ day 60 each 11   folic acid (FOLVITE) 1 MG tablet TAKE 1 TABLET EVERY DAY (Patient taking differently: Take 1 mg by mouth daily.) 90 tablet 3   furosemide (LASIX) 20 MG tablet Take 2 tablets (40 mg total) by mouth in the morning.     magnesium oxide (MAG-OX) 400 MG tablet Take 1 tablet (400 mg total)  by mouth daily.     meclizine (ANTIVERT) 25 MG tablet Take 25 mg by mouth 3 (three) times daily as needed for dizziness.      omeprazole (PRILOSEC) 40 MG capsule Take 40 mg by mouth daily.     potassium chloride SA (KLOR-CON M) 20 MEQ tablet Take 1 tablet (20 mEq total) by mouth daily.     No current facility-administered medications for this visit.    Past Medical History:  Diagnosis Date   Anxiety neurosis    Arthritis    Asthma    CAD (coronary artery disease)    a. 08/2017: abnormal  nuc-> sent directly to [facility], NSTEMI, s/p CABG (left internal mammary artery to left anterior descending, sequential saphenous vein graft to ramus intermedius branches 1 and 2).   Carotid artery disease (HCC)    Chronic anemia    Dizziness    GERD (gastroesophageal reflux disease)    Glaucoma    Heart disease    Hiatal hernia    Hyperlipemia    Hypertension    Ischemic cardiomyopathy    Joint pain    Mitral regurgitation    Postoperative atrial fibrillation (HCC) 08/2017   Pulmonary embolus (HCC)    Type 2 diabetes mellitus (HCC)     Past Surgical History:  Procedure Laterality Date   BACK SURGERY  2016   COLONOSCOPY WITH PROPOFOL N/A 02/27/2022   Procedure: COLONOSCOPY WITH PROPOFOL;  Surgeon: Corbin Ade, MD;  [facility]: [facility];  Service: Endoscopy;  Laterality: N/A;   CORONARY ARTERY BYPASS GRAFT N/A 09/18/2017   Procedure: CORONARY ARTERY BYPASS GRAFTING (CABG);  Surgeon: Loreli Slot, MD;  Location: [facility] OR;  Service: Open Heart Surgery;  Laterality: N/A;  Saphenous vein harvest. LIMA to LAD Saphenous vein (sequential) ramus 1 & 2   ESOPHAGOGASTRODUODENOSCOPY (EGD) WITH PROPOFOL N/A 02/26/2022   Procedure: ESOPHAGOGASTRODUODENOSCOPY (EGD) WITH PROPOFOL;  Surgeon: Dolores Frame, MD;  Location: [facility];  Service: Gastroenterology;  Laterality: N/A;   EYE SURGERY Bilateral    "surgery for glaucoma"   GIVENS CAPSULE STUDY N/A 02/27/2022   Procedure: GIVENS CAPSULE STUDY;  Surgeon: Corbin Ade, MD;  Location: [facility];  Service: Endoscopy;  Laterality: N/A;  TO BE DEPLOYED AT 1700 TODAY   HERNIA REPAIR     INTERCOSTAL NERVE BLOCK Right 05/13/2019   Procedure: Intercostal Nerve Block;  Surgeon: Loreli Slot, MD;  Location[facility] OR;  Service: Thoracic;  Laterality: Right;   LEFT HEART CATH AND CORONARY ANGIOGRAPHY N/A 09/15/2017   Procedure: LEFT HEART CATH AND CORONARY ANGIOGRAPHY;  Surgeon: Runell Gess, MD;  Location: [facility];  Service: Cardiovascular;  Laterality: N/A;   LYMPH NODE DISSECTION Right 05/13/2019   Procedure: Lymph Node Dissection;  Surgeon: Loreli Slot, MD;  Location: [facility] OR;  Service: Thoracic;  Laterality: Right;   POLYPECTOMY  02/27/2022   Procedure: POLYPECTOMY;  Surgeon: Corbin Ade, MD;  Location: [facility];  Service: Endoscopy;;   ROTATOR CUFF REPAIR Left    x2   SHOULDER SURGERY     LEFT   TEE WITHOUT CARDIOVERSION N/A 09/18/2017   Procedure: TRANSESOPHAGEAL ECHOCARDIOGRAM (TEE);  Surgeon: Loreli Slot, MD;  Location: [facility] OR;  Service: Open Heart Surgery;  Laterality: N/A;   TOTAL KNEE ARTHROPLASTY     LEFT   VAGINAL HYSTERECTOMY     partial    Family History  Problem Relation Age of Onset   Breast cancer Mother  Diabetes Father    Heart disease Father    Congestive Heart Failure Brother    Anemia Daughter    Seizures Daughter    Migraines Daughter    Diabetes Daughter    Hypertension Daughter    Bladder Cancer Son    Congestive Heart Failure Son    Congestive Heart Failure Son    Colon cancer Neg Hx    Colon polyps Neg Hx     Allergies as of 04/01/2022 - Review Complete 03/27/2022  Allergen Reaction Noted   Beta adrenergic blockers Other (See Comments) 09/20/2014   Calcium channel blockers Other (See Comments) 09/20/2014   Naproxen Hives 09/14/2012    Social History   Socioeconomic History   Marital status: Married    Spouse name: Adela Lank   Number of children: 5   Years of education: Not on file   Highest education level: Not on file  Occupational History   Not on file  Tobacco Use   Smoking status: Former    Packs/day: 0.50    Years: 35.00    Total pack years: 17.50    Types: Cigarettes    Quit date: 03/25/1978    Years since quitting: 44.0    Passive exposure: Never   Smokeless tobacco: Never   Tobacco comments:    quit more than 30 years ago  Vaping Use   Vaping Use: Never used  Substance and Sexual Activity   Alcohol  use: No   Drug use: No   Sexual activity: Not on file  Other Topics Concern   Not on file  Social History Narrative   Not on file   Social Determinants of Health   Financial Resource Strain: Low Risk  (06/29/2019)   Overall Financial Resource Strain (CARDIA)    Difficulty of Paying Living Expenses: Not hard at all  Food Insecurity: No Food Insecurity (02/25/2022)   Hunger Vital Sign    Worried About Running Out of Food in the Last Year: Never true    Ran Out of Food in the Last Year: Never true  Transportation Needs: No Transportation Needs (02/25/2022)   PRAPARE - Administrator, Civil Service (Medical): No    Lack of Transportation (Non-Medical): No  Physical Activity: Inactive (06/29/2019)   Exercise Vital Sign    Days of Exercise per Week: 0 days    Minutes of Exercise per Session: 0 min  Stress: No Stress Concern Present (06/29/2019)   [employer] of Occupational Health - Occupational Stress Questionnaire    Feeling of Stress : Not at all  Social Connections: Moderately Integrated (06/29/2019)   Social Connection and Isolation Panel [NHANES]    Frequency of Communication with Friends and Family: More than three times a week    Frequency of Social Gatherings with Friends and Family: More than three times a week    Attends Religious Services: More than 4 times per year    Active Member of [employer] or Organizations: No    Attends Banker Meetings: Never    Marital Status: Married  Catering manager Violence: Not At Risk (02/25/2022)   Humiliation, Afraid, Rape, and Kick questionnaire    Fear of Current or Ex-Partner: No    Emotionally Abused: No    Physically Abused: No    Sexually Abused: No     Review of Systems   Gen: Denies any fever, chills, fatigue, weight loss, lack of appetite.  CV: Denies chest pain, heart palpitations, peripheral edema, syncope.  Resp: Denies  shortness of breath at rest or with exertion. Denies wheezing or cough.  GI: see  HPI GU : Denies urinary burning, urinary frequency, urinary hesitancy MS: Denies joint pain, muscle weakness, cramps, or limitation of movement.  Derm: Denies rash, itching, dry skin Psych: Denies depression, anxiety, memory loss, and confusion Heme: Denies bruising, bleeding, and enlarged lymph nodes.   Physical Exam   There were no vitals taken for this visit.  General:   Alert and oriented. Pleasant and cooperative. Well-nourished and well-developed.  Head:  Normocephalic and atraumatic. Eyes:  Without icterus, sclera clear and conjunctiva pink.  Ears:  Normal auditory acuity. Mouth:  No deformity or lesions, oral mucosa pink.  Lungs:  Clear to auscultation bilaterally. No wheezes, rales, or rhonchi. No distress.  Heart:  S1, S2 present without murmurs appreciated.  Abdomen:  +BS, soft, non-tender and non-distended. No HSM noted. No guarding or rebound. No masses appreciated.  Rectal:  Deferred  Msk:  Symmetrical without gross deformities. Normal posture. Extremities:  Without edema. Neurologic:  Alert and  oriented x4;  grossly normal neurologically. Skin:  Intact without significant lesions or rashes. Psych:  Alert and cooperative. Normal mood and affect.   Assessment   Karen Tate is a 77 y.o. female with a history of CAD s/p CABG in 2019, heart failure, diabetes, HLD, lung cancer in 2021 s/p right upper lobectomy, history of PE, COPD, and pulmonary hypertension presenting today for hospital follow-up.  Symptomatic anemia, possible GI bleed possible GI bleed:    PLAN   ***    Brooke Bonito, MSN, FNP-BC, AGACNP-BC [facility]

## 2022-04-11 ENCOUNTER — Other Ambulatory Visit: Payer: Self-pay | Admitting: Pulmonary Disease

## 2022-05-16 DIAGNOSIS — U071 COVID-19: Secondary | ICD-10-CM | POA: Diagnosis not present

## 2022-05-16 DIAGNOSIS — J441 Chronic obstructive pulmonary disease with (acute) exacerbation: Secondary | ICD-10-CM | POA: Diagnosis not present

## 2022-05-21 ENCOUNTER — Encounter (HOSPITAL_COMMUNITY): Payer: Self-pay | Admitting: Hematology

## 2022-05-24 ENCOUNTER — Other Ambulatory Visit: Payer: Self-pay | Admitting: Pulmonary Disease

## 2022-05-24 ENCOUNTER — Encounter (HOSPITAL_COMMUNITY): Payer: Self-pay | Admitting: Hematology

## 2022-05-28 ENCOUNTER — Ambulatory Visit (HOSPITAL_COMMUNITY)
Admission: RE | Admit: 2022-05-28 | Discharge: 2022-05-28 | Disposition: A | Discharge status: Home / Self Care | Payer: PPO | Source: Ambulatory Visit | Attending: Hematology | Admitting: Hematology

## 2022-05-28 ENCOUNTER — Inpatient Hospital Stay: Payer: PPO | Attending: Hematology

## 2022-05-28 ENCOUNTER — Ambulatory Visit: Payer: PPO | Admitting: Pulmonary Disease

## 2022-05-28 DIAGNOSIS — D649 Anemia, unspecified: Secondary | ICD-10-CM | POA: Insufficient documentation

## 2022-05-28 DIAGNOSIS — Z87891 Personal history of nicotine dependence: Secondary | ICD-10-CM | POA: Diagnosis not present

## 2022-05-28 DIAGNOSIS — Z79899 Other long term (current) drug therapy: Secondary | ICD-10-CM | POA: Diagnosis not present

## 2022-05-28 DIAGNOSIS — C3491 Malignant neoplasm of unspecified part of right bronchus or lung: Secondary | ICD-10-CM | POA: Insufficient documentation

## 2022-05-28 DIAGNOSIS — E538 Deficiency of other specified B group vitamins: Secondary | ICD-10-CM | POA: Insufficient documentation

## 2022-05-28 DIAGNOSIS — Z85118 Personal history of other malignant neoplasm of bronchus and lung: Secondary | ICD-10-CM | POA: Insufficient documentation

## 2022-05-28 DIAGNOSIS — C3490 Malignant neoplasm of unspecified part of unspecified bronchus or lung: Secondary | ICD-10-CM | POA: Diagnosis not present

## 2022-05-28 DIAGNOSIS — R911 Solitary pulmonary nodule: Secondary | ICD-10-CM | POA: Diagnosis not present

## 2022-05-28 DIAGNOSIS — Z7982 Long term (current) use of aspirin: Secondary | ICD-10-CM | POA: Insufficient documentation

## 2022-05-28 LAB — FERRITIN: Ferritin: 328 ng/mL — ABNORMAL HIGH (ref 11–307)

## 2022-05-28 LAB — CBC WITH DIFFERENTIAL/PLATELET
Abs Immature Granulocytes: 0.02 10*3/uL (ref 0.00–0.07)
Basophils Absolute: 0 10*3/uL (ref 0.0–0.1)
Basophils Relative: 1 %
Eosinophils Absolute: 0.1 10*3/uL (ref 0.0–0.5)
Eosinophils Relative: 2 %
HCT: 35.1 % — ABNORMAL LOW (ref 36.0–46.0)
Hemoglobin: 11 g/dL — ABNORMAL LOW (ref 12.0–15.0)
Immature Granulocytes: 0 %
Lymphocytes Relative: 37 %
Lymphs Abs: 2 10*3/uL (ref 0.7–4.0)
MCH: 30.5 pg (ref 26.0–34.0)
MCHC: 31.3 g/dL (ref 30.0–36.0)
MCV: 97.2 fL (ref 80.0–100.0)
Monocytes Absolute: 0.5 10*3/uL (ref 0.1–1.0)
Monocytes Relative: 9 %
Neutro Abs: 2.8 10*3/uL (ref 1.7–7.7)
Neutrophils Relative %: 51 %
Platelets: 217 10*3/uL (ref 150–400)
RBC: 3.61 MIL/uL — ABNORMAL LOW (ref 3.87–5.11)
RDW: 15.9 % — ABNORMAL HIGH (ref 11.5–15.5)
WBC: 5.4 10*3/uL (ref 4.0–10.5)
nRBC: 0 % (ref 0.0–0.2)

## 2022-05-28 LAB — COMPREHENSIVE METABOLIC PANEL
ALT: 15 U/L (ref 0–44)
AST: 20 U/L (ref 15–41)
Albumin: 3.7 g/dL (ref 3.5–5.0)
Alkaline Phosphatase: 66 U/L (ref 38–126)
Anion gap: 10 (ref 5–15)
BUN: 23 mg/dL (ref 8–23)
CO2: 30 mmol/L (ref 22–32)
Calcium: 9.6 mg/dL (ref 8.9–10.3)
Chloride: 100 mmol/L (ref 98–111)
Creatinine, Ser: 1.12 mg/dL — ABNORMAL HIGH (ref 0.44–1.00)
GFR, Estimated: 50 mL/min — ABNORMAL LOW (ref >60)
Glucose, Bld: 180 mg/dL — ABNORMAL HIGH (ref 70–99)
Potassium: 4.1 mmol/L (ref 3.5–5.1)
Sodium: 140 mmol/L (ref 135–145)
Total Bilirubin: 0.5 mg/dL (ref 0.3–1.2)
Total Protein: 7.7 g/dL (ref 6.5–8.1)

## 2022-05-28 LAB — IRON AND TIBC
Iron: 78 ug/dL (ref 28–170)
Saturation Ratios: 27 % (ref 10.4–31.8)
TIBC: 293 ug/dL (ref 250–450)
UIBC: 215 ug/dL

## 2022-05-28 LAB — FOLATE: Folate: >40 ng/mL (ref >5.9)

## 2022-05-28 LAB — VITAMIN B12: Vitamin B-12: 354 pg/mL (ref 180–914)

## 2022-05-28 MED ORDER — IOHEXOL 300 MG/ML  SOLN
100.0000 mL | Freq: Once | INTRAMUSCULAR | Status: AC | PRN
  Administered 2022-05-28: 75 mL via INTRAVENOUS

## 2022-06-03 NOTE — Progress Notes (Signed)
Karen Tate [address], [place] [address]    Clinic Day:  06/04/2022  Referring physician: Neale Burly, MD  Patient Care Team: Neale Burly, MD as PCP - General (Internal Medicine) Harl Bowie Alphonse Guild, MD as PCP - Cardiology (Cardiology) Derek Jack, MD as Medical Oncologist (Oncology) Donetta Potts, RN as Oncology Nurse Navigator (Oncology)   ASSESSMENT & PLAN:   Assessment: 1.  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 -PET CT scan on 04/26/2019 showed mildly hypermetabolic 1.1 cm right upper lobe pulmonary nodule with SUV 2.4.  No findings of adenopathy or metastatic disease. -Right upper lobectomy on 05/13/2019 shows 1.3 cm invasive adenocarcinoma, negative for visceral pleural invasion, LVI negative, margins negative.  Lymph nodes from stations 7, 11 R, 10 R, 4R, 12R are negative. -CT chest surveillance every 6 months for 2 to 3 years was recommended followed by noncontrast CT annually. -CT chest on 10/27/2019 showed surgical changes in the right upper lobe lobectomy with no findings of recurrence.  2 small indeterminate right lower lobe pulmonary nodules, follow-up recommended. -CT chest on 05/04/2020 with post right upper lobe resection with no evidence of recurrence or metastasis.  Clearing of bilateral pleural effusions.   2.  Normocytic anemia: -Her previous hemoglobin was 7.6 with MCV of 88. -She has mild degree of chronic kidney disease which is likely contributing to her anemia. -Colonoscopy earlier this year was reportedly normal.     3.  Weight loss: -She lost about 20-25 pounds since her surgery. -She is slowly gaining back.  Her appetite is improving.  Plan: 1.  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 - Recommend follow-up in 6 months with CT scan of the chest without contrast and labs.   2.  Normocytic anemia: - She was recently hospitalized from 02/25/2023 through 03/04/2023 with severe anemia and was thought to be from bleeding.  She received 2 units PRBC. - Today hemoglobin is 11.  Ferritin is 328 and percent saturation 27.  No indication for parenteral iron therapy.   3.  Vitamin B12 deficiency: - She is not taking B12 tablet.  B12 level is 354 today.   Orders Placed This Encounter  Procedures   CT Chest Wo Contrast    Standing Status:   Future    Standing Expiration Date:   06/04/2023    Order Specific Question:   Preferred imaging location?    Answer:   [place] as a scribe for Derek Jack, MD.,have documented all relevant documentation on the behalf of Derek Jack, MD,as directed by  Derek Jack, MD while in the presence of Derek Jack, MD.   I, Derek Jack MD, have reviewed the above documentation for accuracy and completeness, and I agree with the above.   Derek Jack, MD   3/12/20246:03 PM  CHIEF COMPLAINT:   Diagnosis: stage I right lung adenocarcinoma and IDA    Cancer Staging  Non-small cell cancer of right lung [facility]) Staging form: Lung, AJCC 8th Edition - Clinical stage from 06/29/2019: Stage IA2 (cT1b, cN0, cM0) - Signed by Derek Jack, MD on 06/29/2019    Prior Therapy: Right upper lobectomy on 05/13/2019  Current Therapy:  Intermittent IV Iron last on 09/22/2020    HISTORY OF PRESENT ILLNESS:   Oncology History  Non-small cell cancer of right lung ([place])  06/29/2019 Initial Diagnosis   Non-small cell cancer of right lung ([place])   06/29/2019 Cancer Staging   Staging form: Lung, AJCC 8th Edition - Clinical stage from 06/29/2019: Stage IA2 (cT1b, cN0, cM0) - Signed by Derek Jack, MD on 06/29/2019      INTERVAL  HISTORY:   Karen Tate is a 77 y.o. female presenting to clinic today for follow up of stage I right lung adenocarcinoma and IDA. She was last seen by me on 11/27/21.  Today, she states that she is doing well overall. Her appetite level is at 100%. Her energy level is at 80%. She denies any bleeding per rectum or melena.  Denies any cough or hemoptysis.   PAST MEDICAL HISTORY:   Past Medical History: Past Medical History:  Diagnosis Date   Anxiety neurosis    Arthritis    Asthma    CAD (coronary artery disease)    a. 08/2017: abnormal nuc-> sent directly to [facility], NSTEMI, s/p CABG (left internal mammary artery to left anterior descending, sequential saphenous vein graft to ramus intermedius branches 1 and 2).   Carotid artery disease (HCC)    Chronic anemia    Dizziness    GERD (gastroesophageal reflux disease)    Glaucoma    Heart disease    Hiatal hernia    Hyperlipemia    Hypertension    Ischemic cardiomyopathy    Joint pain    Mitral regurgitation    Postoperative atrial fibrillation ([place]) 08/2017   Pulmonary embolus (HCC)    Type 2 diabetes mellitus [facility])     Surgical History: Past Surgical History:  Procedure Laterality Date   BACK SURGERY  2016   COLONOSCOPY WITH PROPOFOL N/A 02/27/2022   Procedure: COLONOSCOPY WITH PROPOFOL;  Surgeon: Daneil Dolin, MD;  [facility];  Service: Endoscopy;  Laterality: N/A;   CORONARY ARTERY BYPASS GRAFT N/A 09/18/2017   Procedure: CORONARY ARTERY BYPASS GRAFTING (CABG);  Surgeon: [name], MD;  Location: [place];  Service: Open Heart Surgery;  Laterality: N/A;  Saphenous vein harvest. LIMA to LAD Saphenous vein (sequential) ramus 1 & 2   ESOPHAGOGASTRODUODENOSCOPY (EGD) WITH PROPOFOL N/A 02/26/2022   Procedure: ESOPHAGOGASTRODUODENOSCOPY (EGD) WITH PROPOFOL;  Surgeon: Harvel Quale, MD;  [facility];  Service: Gastroenterology;  Laterality: N/A;   EYE SURGERY Bilateral    "surgery for glaucoma"    GIVENS CAPSULE STUDY N/A 02/27/2022   Procedure: GIVENS CAPSULE STUDY;  Surgeon: Daneil Dolin, MD;  [facility];  Service: Endoscopy;  Laterality: N/A;  TO BE DEPLOYED AT [place]     INTERCOSTAL NERVE BLOCK Right 05/13/2019   Procedure: Intercostal Nerve Block;  Surgeon: [name], MD;  Location: [place];  Service: Thoracic;  Laterality: Right;   LEFT HEART CATH AND CORONARY ANGIOGRAPHY N/A 09/15/2017   Procedure: LEFT HEART CATH AND CORONARY ANGIOGRAPHY;  Surgeon: Lorretta Harp, MD;  Location: [place] [facility];  Service: Cardiovascular;  Laterality: N/A;   LYMPH NODE DISSECTION Right 05/13/2019   Procedure: Lymph Node Dissection;  Surgeon: [name], MD;  Location: [place];  Service: Thoracic;  Laterality: Right;   POLYPECTOMY  02/27/2022   Procedure: POLYPECTOMY;  Surgeon: Daneil Dolin, MD;  [facility];  Service: Endoscopy;;   ROTATOR  CUFF REPAIR Left    x2   SHOULDER SURGERY     LEFT   TEE WITHOUT CARDIOVERSION N/A 09/18/2017   Procedure: TRANSESOPHAGEAL ECHOCARDIOGRAM (TEE);  Surgeon: [name], MD;  Location: [place];  Service: Open Heart Surgery;  Laterality: N/A;   TOTAL KNEE ARTHROPLASTY     LEFT   VAGINAL HYSTERECTOMY     partial    Social History: Social History   Socioeconomic History   Marital status: Married    Spouse name: [name]   Number of children: 5   Years of education: Not on file   Highest education level: Not on file  Occupational History   Not on file  Tobacco Use   Smoking status: Former    Packs/day: 0.50    Years: 35.00    Total pack years: 17.50    Types: Cigarettes    Quit date: 03/25/1978    Years since quitting: 44.2    Passive exposure: Never   Smokeless tobacco: Never   Tobacco comments:    quit more than 30 years ago  Vaping Use   Vaping Use: Never used  Substance and Sexual Activity   Alcohol use: No   Drug use: No   Sexual activity: Not on file  Other  Topics Concern   Not on file  Social History Narrative   Not on file   Social Determinants of Health   Financial Resource Strain: Low Risk  (06/29/2019)   Overall Financial Resource Strain (CARDIA)    Difficulty of Paying Living Expenses: Not hard at all  Food Insecurity: No Food Insecurity (02/25/2022)   Hunger Vital Sign    Worried About Running Out of Food in the Last Year: Never true    Ran Out of Food in the Last Year: Never true  Transportation Needs: No Transportation Needs (02/25/2022)   PRAPARE - Hydrologist (Medical): No    Lack of Transportation (Non-Medical): No  Physical Activity: Inactive (06/29/2019)   Exercise Vital Sign    Days of Exercise per Week: 0 days    Minutes of Exercise per Session: 0 min  Stress: No Stress Concern Present (06/29/2019)   [place]    Feeling of Stress : Not at all  Social Connections: Moderately Integrated (06/29/2019)   Social Connection and Isolation Panel [NHANES]    Frequency of Communication with Friends and Family: More than three times a week    Frequency of Social Gatherings with Friends and Family: More than three times a week    Attends Religious Services: More than 4 times per year    Active Member of [employer] or Organizations: No    Attends Archivist Meetings: Never    Marital Status: Married  Human resources officer Violence: Not At Risk (02/25/2022)   Humiliation, Afraid, Rape, and Kick questionnaire    Fear of Current or Ex-Partner: No    Emotionally Abused: No    Physically Abused: No    Sexually Abused: No    Family History: Family History  Problem Relation Age of Onset   Breast cancer Mother    Diabetes Father    Heart disease Father    Congestive Heart Failure Brother    Anemia Daughter    Seizures Daughter    Migraines Daughter    Diabetes Daughter    Hypertension Daughter    Bladder Cancer Son    Congestive Heart  Failure Son  Congestive Heart Failure Son    Colon cancer Neg Hx    Colon polyps Neg Hx     Current Medications:  Current Outpatient Medications:    acetaminophen (TYLENOL) 500 MG tablet, Take 1,000 mg by mouth every 6 (six) hours as needed for moderate pain., Disp: , Rfl:    albuterol (VENTOLIN HFA) 108 (90 Base) MCG/ACT inhaler, Inhale 2 puffs into the lungs every 6 (six) hours as needed for wheezing or shortness of breath., Disp: , Rfl:    ALPRAZolam (XANAX) 0.5 MG tablet, Take 0.5 mg by mouth 2 (two) times daily., Disp: , Rfl:    amLODipine (NORVASC) 5 MG tablet, Take 1 tablet (5 mg total) by mouth daily., Disp: 30 tablet, Rfl: 1   aspirin EC 81 MG tablet, Take 1 tablet (81 mg total) by mouth daily. Swallow whole., Disp: 30 tablet, Rfl: 12   atorvastatin (LIPITOR) 80 MG tablet, Take 1 tablet (80 mg total) by mouth daily at 6 PM., Disp: 90 tablet, Rfl: 3   fish oil-omega-3 fatty acids 1000 MG capsule, Take 1 g by mouth 3 (three) times daily. , Disp: , Rfl:    fluticasone-salmeterol (ADVAIR) 250-50 MCG/ACT AEPB, INHALE 1 DOSE BY MOUTH TWICE DAILY (MORNING AND BEDTIME), Disp: 60 each, Rfl: 0   folic acid (FOLVITE) 1 MG tablet, TAKE 1 TABLET EVERY DAY (Patient taking differently: Take 1 mg by mouth daily.), Disp: 90 tablet, Rfl: 3   furosemide (LASIX) 20 MG tablet, Take 2 tablets (40 mg total) by mouth in the morning., Disp: , Rfl:    magnesium oxide (MAG-OX) 400 MG tablet, Take 1 tablet (400 mg total) by mouth daily., Disp: , Rfl:    meclizine (ANTIVERT) 25 MG tablet, Take 25 mg by mouth 3 (three) times daily as needed for dizziness. , Disp: , Rfl:    omeprazole (PRILOSEC) 40 MG capsule, Take 40 mg by mouth daily., Disp: , Rfl:    potassium chloride SA (KLOR-CON M) 20 MEQ tablet, Take 1 tablet (20 mEq total) by mouth daily., Disp: , Rfl:    Allergies: Allergies  Allergen Reactions   Beta Adrenergic Blockers Other (See Comments)    Dropped heart rate to the 40's   Calcium Channel  Blockers Other (See Comments)    Dropped HR into the 40's   Naproxen Hives    REVIEW OF SYSTEMS:   Review of Systems  Constitutional:  Negative for chills, fatigue and fever.  HENT:   Negative for lump/mass, mouth sores, nosebleeds, sore throat and trouble swallowing.   Eyes:  Negative for eye problems.  Respiratory:  Positive for shortness of breath. Negative for cough.   Cardiovascular:  Negative for chest pain, leg swelling and palpitations.  Gastrointestinal:  Negative for abdominal pain, constipation, diarrhea, nausea and vomiting.  Genitourinary:  Negative for bladder incontinence, difficulty urinating, dysuria, frequency, hematuria and nocturia.   Musculoskeletal:  Negative for arthralgias, back pain, flank pain, myalgias and neck pain.  Skin:  Negative for itching and rash.  Neurological:  Negative for dizziness, headaches and numbness.  Hematological:  Does not bruise/bleed easily.  Psychiatric/Behavioral:  Negative for depression, sleep disturbance and suicidal ideas. The patient is not nervous/anxious.   All other systems reviewed and are negative.    VITALS:   Blood pressure (!) 195/75, pulse 92, temperature 98 F (36.7 C), temperature source Tympanic, resp. rate 19, height '5\' 5"'$  (1.651 m), weight 183 lb 9.6 oz (83.3 kg), SpO2 98 %.  Wt Readings from Last 3 Encounters:  06/04/22 183 lb 9.6 oz (83.3 kg)  03/27/22 166 lb 3.2 oz (75.4 kg)  03/03/22 166 lb 10.7 oz (75.6 kg)    Body mass index is 30.55 kg/m.  Performance status (ECOG): 1 - Symptomatic but completely ambulatory  PHYSICAL EXAM:   Physical Exam Vitals and nursing note reviewed. Exam conducted with a chaperone present.  Constitutional:      Appearance: Normal appearance.  Cardiovascular:     Rate and Rhythm: Normal rate and regular rhythm.     Pulses: Normal pulses.     Heart sounds: Normal heart sounds.  Pulmonary:     Effort: Pulmonary effort is normal.     Breath sounds: Normal breath sounds.   Abdominal:     Palpations: Abdomen is soft. There is no hepatomegaly, splenomegaly or mass.     Tenderness: There is no abdominal tenderness.  Musculoskeletal:     Right lower leg: No edema.     Left lower leg: No edema.  Lymphadenopathy:     Cervical: No cervical adenopathy.     Right cervical: No superficial, deep or posterior cervical adenopathy.    Left cervical: No superficial, deep or posterior cervical adenopathy.     Upper Body:     Right upper body: No supraclavicular or axillary adenopathy.     Left upper body: No supraclavicular or axillary adenopathy.  Neurological:     General: No focal deficit present.     Mental Status: She is alert and oriented to person, place, and time.  Psychiatric:        Mood and Affect: Mood normal.        Behavior: Behavior normal.     LABS:      Latest Ref Rng & Units 05/28/2022   10:15 AM 03/03/2022    5:50 AM 03/02/2022    4:05 AM  CBC  WBC 4.0 - 10.5 K/uL 5.4  5.1  6.1   Hemoglobin 12.0 - 15.0 g/dL 11.0  9.3  9.1   Hematocrit 36.0 - 46.0 % 35.1  31.7  30.1   Platelets 150 - 400 K/uL 217  181  196       Latest Ref Rng & Units 05/28/2022   10:15 AM 03/03/2022    5:50 AM 03/02/2022    4:05 AM  CMP  Glucose 70 - 99 mg/dL 180  120  92   BUN 8 - 23 mg/dL '23  12  13   '$ Creatinine 0.44 - 1.00 mg/dL 1.12  1.00  1.02   Sodium 135 - 145 mmol/L 140  141  140   Potassium 3.5 - 5.1 mmol/L 4.1  3.7  3.9   Chloride 98 - 111 mmol/L 100  96  97   CO2 22 - 32 mmol/L 30  37  34   Calcium 8.9 - 10.3 mg/dL 9.6  8.9  9.0   Total Protein 6.5 - 8.1 g/dL 7.7     Total Bilirubin 0.3 - 1.2 mg/dL 0.5     Alkaline Phos 38 - 126 U/L 66     AST 15 - 41 U/L 20     ALT 0 - 44 U/L 15        No results found for: "CEA1", "CEA" / No results found for: "CEA1", "CEA" No results found for: "PSA1" No results found for: "[id number]" No results found for: "CAN125"  Lab Results  Component Value Date   TOTALPROTELP 7.4 08/18/2020   TOTALPROTELP 7.1 08/18/2020    ALBUMINELP 3.7 08/18/2020  A1GS 0.2 08/18/2020   A2GS 0.9 08/18/2020   BETS 1.1 08/18/2020   GAMS 1.1 08/18/2020   MSPIKE Not Observed 08/18/2020   SPEI Comment 08/18/2020   Lab Results  Component Value Date   TIBC 293 05/28/2022   TIBC 286 02/25/2022   TIBC 272 11/07/2021   FERRITIN 328 (H) 05/28/2022   FERRITIN 281 02/25/2022   FERRITIN 417 (H) 11/07/2021   IRONPCTSAT 27 05/28/2022   IRONPCTSAT 13 02/25/2022   IRONPCTSAT 19 11/07/2021   Lab Results  Component Value Date   LDH 141 12/18/2020   LDH 151 08/18/2020   LDH 147 06/29/2019     STUDIES:   CT Chest W Contrast  Result Date: 05/29/2022 CLINICAL DATA:  Non-small cell lung cancer, monitor. History of stage I right lung adenocarcinoma status post upper lobectomy 05/13/2019. * Tracking Code: BO * EXAM: CT CHEST WITH CONTRAST TECHNIQUE: Multidetector CT imaging of the chest was performed during intravenous contrast administration. RADIATION DOSE REDUCTION: This exam was performed according to the departmental dose-optimization program which includes automated exposure control, adjustment of the mA and/or kV according to patient size and/or use of iterative reconstruction technique. CONTRAST:  61m OMNIPAQUE IOHEXOL 300 MG/ML  SOLN COMPARISON:  Chest CT 11/07/2021, 07/31/2021 and 12/18/2020. FINDINGS: Cardiovascular: Atherosclerosis of the aorta, great vessels and coronary arteries status post median sternotomy and CABG. No acute vascular findings are demonstrated. There is stable mild cardiac enlargement. No significant pericardial fluid. Mediastinum/Nodes: Chronic prominent mediastinal lymph nodes are unchanged, including an 8 mm right paratracheal node on image 38/2 and a subcarinal node measuring 10 mm on image 58/2. These are presumed reactive based on stability. No enlarging mediastinal, hilar or axillary lymph nodes identified. The thyroid gland appears stable. The esophagus and trachea appear unremarkable. Lungs/Pleura: No  pleural effusion or pneumothorax. Status post right upper lobectomy. Interval improved fissural thickening on the right with otherwise stable postsurgical changes along the superior aspect of the major fissure. There is stable scarring anteriorly in the superior segment of the right lower lobe. There is a stable 6 x 4 mm subpleural nodule in the right lower lobe on image 47/4, stable from at least 11/16/2019 and consistent with a benign finding. No new or enlarging nodules. Upper abdomen: The visualized upper abdomen appears stable, without significant findings. The adrenal glands appear unchanged. Musculoskeletal/Chest wall: There is no chest wall mass or suspicious osseous finding. Healed median sternotomy. Mild spondylosis. IMPRESSION: 1. Stable postoperative chest CT status post right upper lobectomy. 2. No evidence of local recurrence or metastatic disease. 3.  Aortic Atherosclerosis (ICD10-I70.0). Electronically Signed   By: WRichardean SaleM.D.   On: 05/29/2022 13:34

## 2022-06-04 ENCOUNTER — Inpatient Hospital Stay (HOSPITAL_BASED_OUTPATIENT_CLINIC_OR_DEPARTMENT_OTHER): Payer: PPO | Admitting: Hematology

## 2022-06-04 VITALS — BP 195/75 | HR 92 | Temp 98.0°F | Resp 19 | Ht 65.0 in | Wt 183.6 lb

## 2022-06-04 DIAGNOSIS — C3491 Malignant neoplasm of unspecified part of right bronchus or lung: Secondary | ICD-10-CM

## 2022-06-04 NOTE — Patient Instructions (Addendum)
[place] at [facility] Discharge Instructions   You were seen and examined today by Dr. Delton Coombes.  He reviewed the results of your lab work. Your hemoglobin is 11, up from 9 while you were in the hospital. Your iron levels and all other results were normal/stable.   He reviewed the results of your CT scan which is normal/stable. No evidence of cancer was noted on the CT.   We will see you back in months. We will repeat lab work and a CT scan prior to your next visit.    Thank you for choosing [place] at [facility] to provide your oncology and hematology care.  To afford each patient quality time with our provider, please arrive at least 15 minutes before your scheduled appointment time.   If you have a lab appointment with the [facility] please come in thru the Main Entrance and check in at the main information desk.  You need to re-schedule your appointment should you arrive 10 or more minutes late.  We strive to give you quality time with our providers, and arriving late affects you and other patients whose appointments are after yours.  Also, if you no show three or more times for appointments you may be dismissed from the clinic at the providers discretion.     Again, thank you for choosing [facility].  Our hope is that these requests will decrease the amount of time that you wait before being seen by our physicians.       _____________________________________________________________  Should you have questions after your visit to [facility], please contact our office at [phone] and follow the prompts.  Our office hours are 8:00 a.m. and 4:30 p.m. Monday - Friday.  Please note that voicemails left after 4:00 p.m. may not be returned until the following business day.  We are closed weekends and major holidays.  You do have access to a nurse 24-7, just call the main number to the clinic [phone] and  do not press any options, hold on the line and a nurse will answer the phone.    For prescription refill requests, have your pharmacy contact our office and allow 72 hours.    Due to Covid, you will need to wear a mask upon entering the hospital. If you do not have a mask, a mask will be given to you at the Main Entrance upon arrival. For doctor visits, patients may have 1 support person age 79 or older with them. For treatment visits, patients can not have anyone with them due to social distancing guidelines and our immunocompromised population.

## 2022-06-05 ENCOUNTER — Other Ambulatory Visit: Payer: Self-pay | Admitting: *Deleted

## 2022-06-05 DIAGNOSIS — C3491 Malignant neoplasm of unspecified part of right bronchus or lung: Secondary | ICD-10-CM

## 2022-06-05 DIAGNOSIS — E538 Deficiency of other specified B group vitamins: Secondary | ICD-10-CM

## 2022-06-05 DIAGNOSIS — D649 Anemia, unspecified: Secondary | ICD-10-CM

## 2022-06-10 ENCOUNTER — Ambulatory Visit: Payer: Medicare Other | Admitting: Nurse Practitioner

## 2022-06-10 NOTE — Progress Notes (Deleted)
Cardiology Office Note:    Date:  03/27/2022  ID:  FAITHE ARIOLA, DOB 10-09-1945, MRN [id number]  PCP:  Neale Burly, MD   [facility] Cardiologist:  Carlyle Dolly, MD     Referring MD: Neale Burly, MD   CC: Here for hospital follow-up  History of Present Illness:    Karen Tate is a 77 y.o. female with a hx of the following:  CAD, status post CABG x 3 and NSTEMI Carotid artery disease Chronic diastolic CHF MR History of pulmonary embolus A-fib Hypertension History of lung cancer, s/p RUL lobectomy COPD (followed by pulmonology) Pulmonary HTN Hx of GI bleed, anemia Type 2 diabetes  Patient is a 78 year old female with past medical history as mentioned above.  History of remote CABG in 2019 LIMA to LAD, sequential saphenous vein graft to RI branches wanted to.  Presented with an NSTEMI, echocardiogram in 2020 revealed normal EF.  Most recent carotid ultrasound revealed 1 to 39% stenosis along right ICA, 40 to 59% stenosis along left ICA.  Was admitted in 2022 for shortness of breath, was thought to be volume overloaded and diuresed.  Echocardiogram in 2022 revealed normal EF, grade 2 DD, mild RV dysfunction, PASP 61 mmHg.  In May 2023 she had an ER visit for mild pulmonary edema.  BNP 315.  Trops negative. EKG showed A-fib, rate controlled, nonspecific ST/T wave abnormality.  Received IV Lasix, Flexeril, and Tylenol in the ED.  Was discharged later in stable condition.  Was admitted in December 2023 for chief complaint of weakness and syncope.  Episode lasted 2 minutes, no postictal symptoms, no seizure-like activity.  Patient stated she had increasing shortness of breath and dyspnea on exertion for past 2 months, worse in past 2 to 3 weeks.  Denied any flulike symptoms, chest pain, hemoptysis, or coughing.  She did have some slight signs of fluid overload.  Cardiology was following.  She received IV Lasix.  Repeat echocardiogram revealed normal EF, no  RWMA, RV overload, moderate to severe TR.  A-fib with heart rate from 40-60, Coreg was held due to severe bradycardia.  Anticoagulation was held due to melena/acute blood loss anemia.  Syncopal episode was likely secondary to blood loss anemia with drop in hemoglobin.  She was given blood transfusion.  Underwent EGD that showed normal stomach/duodenum, hiatal hernia, colonoscopy did not reveal any source of bleeding found.  Underwent CT enteroscopy.  Small adenoma was removed by colon, prominent vascular pattern small bowel without suspect bleeding lesion being seen VCE.  CTE enteroscopy demonstrated no evidence of small bowel tumor.  GI team suspected she most likely had blood loss from small bowel related to vascular lesion such as a dieulafoy.  GI recommended that she may continue baby aspirin.  Was hyperkalemic during hospital, given [facility] and this resolved potassium level.  Lisinopril was held due to hyperkalemia.  Carotid Doppler revealed less than 50% right ICA. stenosis, left ICA 70%.  Was discharged in stable condition on March 03, 2022.  Today she presents for follow-up with husband.  She states she has been doing well. Denies any syncopal episodes episodes since hospital d/c. Husband states breathing has improved. Admits to stable, chronic DOE. Wears 2 liters of oxygen at home with exertional activities. Sees a pulmonologist regularly. Denies any chest pain, palpitations, orthopnea, PND, significant weight changes, acute bleeding, dizziness, lightheadedness, or claudication.  Denies any other questions or concerns.   Past Medical History:  Diagnosis Date  Anxiety neurosis    Arthritis    Asthma    CAD (coronary artery disease)    a. 08/2017: abnormal nuc-> sent directly to [facility], NSTEMI, s/p CABG (left internal mammary artery to left anterior descending, sequential saphenous vein graft to ramus intermedius branches 1 and 2).   Carotid artery disease (HCC)    Chronic anemia    Dizziness     GERD (gastroesophageal reflux disease)    Glaucoma    Heart disease    Hiatal hernia    Hyperlipemia    Hypertension    Ischemic cardiomyopathy    Joint pain    Mitral regurgitation    Postoperative atrial fibrillation ([place]) 08/2017   Pulmonary embolus (HCC)    Type 2 diabetes mellitus ([place])     Past Surgical History:  Procedure Laterality Date   BACK SURGERY  2016   COLONOSCOPY WITH PROPOFOL N/A 02/27/2022   Procedure: COLONOSCOPY WITH PROPOFOL;  Surgeon: Daneil Dolin, MD;  [facility];  Service: Endoscopy;  Laterality: N/A;   CORONARY ARTERY BYPASS GRAFT N/A 09/18/2017   Procedure: CORONARY ARTERY BYPASS GRAFTING (CABG);  Surgeon: [name], MD;  Location: [place];  Service: Open Heart Surgery;  Laterality: N/A;  Saphenous vein harvest. LIMA to LAD Saphenous vein (sequential) ramus 1 & 2   ESOPHAGOGASTRODUODENOSCOPY (EGD) WITH PROPOFOL N/A 02/26/2022   Procedure: ESOPHAGOGASTRODUODENOSCOPY (EGD) WITH PROPOFOL;  Surgeon: Harvel Quale, MD;  [facility];  Service: Gastroenterology;  Laterality: N/A;   EYE SURGERY Bilateral    "surgery for glaucoma"   GIVENS CAPSULE STUDY N/A 02/27/2022   Procedure: GIVENS CAPSULE STUDY;  Surgeon: Daneil Dolin, MD;  [facility];  Service: Endoscopy;  Laterality: N/A;  TO BE DEPLOYED AT [place]     INTERCOSTAL NERVE BLOCK Right 05/13/2019   Procedure: Intercostal Nerve Block;  Surgeon: [name], MD;  Location: [place];  Service: Thoracic;  Laterality: Right;   LEFT HEART CATH AND CORONARY ANGIOGRAPHY N/A 09/15/2017   Procedure: LEFT HEART CATH AND CORONARY ANGIOGRAPHY;  Surgeon: Lorretta Harp, MD;  Location: [place] [facility];  Service: Cardiovascular;  Laterality: N/A;   LYMPH NODE DISSECTION Right 05/13/2019   Procedure: Lymph Node Dissection;  Surgeon: [name], MD;  Location: [place];  Service: Thoracic;  Laterality: Right;   POLYPECTOMY   02/27/2022   Procedure: POLYPECTOMY;  Surgeon: Daneil Dolin, MD;  [facility];  Service: Endoscopy;;   ROTATOR CUFF REPAIR Left    x2   SHOULDER SURGERY     LEFT   TEE WITHOUT CARDIOVERSION N/A 09/18/2017   Procedure: TRANSESOPHAGEAL ECHOCARDIOGRAM (TEE);  Surgeon: [name], MD;  Location: [place];  Service: Open Heart Surgery;  Laterality: N/A;   TOTAL KNEE ARTHROPLASTY     LEFT   VAGINAL HYSTERECTOMY     partial    Current Medications: No outpatient medications have been marked as taking for the 06/10/22 encounter (Appointment) with Finis Bud, NP.     Allergies:   Beta adrenergic blockers, Calcium channel blockers, and Naproxen   Social History   Socioeconomic History   Marital status: Married    Spouse name: [name]   Number of children: 5   Years of education: Not on file   Highest education level: Not on file  Occupational History   Not on file  Tobacco Use   Smoking status: Former    Packs/day: 0.50  Years: 35.00    Additional pack years: 0.00    Total pack years: 17.50    Types: Cigarettes    Quit date: 03/25/1978    Years since quitting: 44.2    Passive exposure: Never   Smokeless tobacco: Never   Tobacco comments:    quit more than 30 years ago  Vaping Use   Vaping Use: Never used  Substance and Sexual Activity   Alcohol use: No   Drug use: No   Sexual activity: Not on file  Other Topics Concern   Not on file  Social History Narrative   Not on file   Social Determinants of Health   Financial Resource Strain: Low Risk  (06/29/2019)   Overall Financial Resource Strain (CARDIA)    Difficulty of Paying Living Expenses: Not hard at all  Food Insecurity: No Food Insecurity (02/25/2022)   Hunger Vital Sign    Worried About Running Out of Food in the Last Year: Never true    Ran Out of Food in the Last Year: Never true  Transportation Needs: No Transportation Needs (02/25/2022)   PRAPARE - Radiographer, therapeutic (Medical): No    Lack of Transportation (Non-Medical): No  Physical Activity: Inactive (06/29/2019)   Exercise Vital Sign    Days of Exercise per Week: 0 days    Minutes of Exercise per Session: 0 min  Stress: No Stress Concern Present (06/29/2019)   [place]    Feeling of Stress : Not at all  Social Connections: Moderately Integrated (06/29/2019)   Social Connection and Isolation Panel [NHANES]    Frequency of Communication with Friends and Family: More than three times a week    Frequency of Social Gatherings with Friends and Family: More than three times a week    Attends Religious Services: More than 4 times per year    Active Member of [employer] or Organizations: No    Attends Music therapist: Never    Marital Status: Married     Family History: The patient's family history includes Anemia in her daughter; Bladder Cancer in her son; Breast cancer in her mother; Congestive Heart Failure in her brother, son, and son; Diabetes in her daughter and father; Heart disease in her father; Hypertension in her daughter; Migraines in her daughter; Seizures in her daughter. There is no history of Colon cancer or Colon polyps.  ROS:   Review of Systems  Constitutional: Negative.   HENT: Negative.    Eyes: Negative.   Respiratory:  Positive for shortness of breath. Negative for cough, hemoptysis, sputum production and wheezing.        See HPI.   Cardiovascular: Negative.   Gastrointestinal: Negative.   Genitourinary: Negative.   Musculoskeletal: Negative.   Skin: Negative.   Neurological: Negative.   Endo/Heme/Allergies: Negative.   Psychiatric/Behavioral: Negative.      Please see the history of present illness.    All other systems reviewed and are negative.  EKGs/Labs/Other Studies Reviewed:    The following studies were reviewed today:   EKG:  EKG is not ordered today.   Echocardiogram on  02/25/2022: 1. Left ventricular ejection fraction, by estimation, is 65 to 70%. The  left ventricle has normal function. The left ventricle has no regional  wall motion abnormalities. Left ventricular diastolic parameters are  indeterminate.   2. Ventricular septum is flattnened in systole and diastole suggesting RV  pressure and volume overload. RV  not well visualized, grossly appears  enlarged with decreased systolic function. . Right ventricular systolic  function was not well visualized. The   right ventricular size is not well visualized. There is severely elevated  pulmonary artery systolic pressure.   3. Left atrial size was mildly dilated.   4. The mitral valve is abnormal. Mild mitral valve regurgitation. No  evidence of mitral stenosis. Moderate mitral annular calcification.   5. Moderate to severe TR, hepatic systolic flow reversal would suggest  severe TR. . Tricuspid valve regurgitation is moderate. 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   6. The aortic valve is tricuspid. There is moderate calcification of the  aortic valve. There is moderate thickening of the aortic valve. Aortic  valve regurgitation is not visualized. No aortic stenosis is present.   7. The pulmonic valve was abnormal.   8. The inferior vena cava is dilated in size with <50% respiratory  variability, suggesting right atrial pressure of 15 mmHg.  Carotid duplex on 02/25/2022: IMPRESSION: 1. 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 2. Less than 50% stenosis of the right internal carotid artery.  TEE on 09/18/2017:  Aortic valve: The valve is trileaflet. Mild valve thickening present.  Mild valve calcification present. No stenosis. No regurgitation.   Mitral valve: Mild regurgitation with central jet.   Right ventricle: Normal wall thickness and ejection fraction. Cavity is  mildly dilated.    Tricuspid valve: Trace regurgitation. The tricuspid valve regurgitation  jet is central.   Left Atrial Appendage: Not well visualized   Pulmonic valve: No regurgitation.   Left ventricle: Estimated EF 35-40%. Severe hypokinesis of the anterior,  anterolateral and inferior wall. Post bypass assessment: Tricuspid, Pulmonic, Mitral and Aortic valves unchanged. LVEF unchanged to  slightly improved. CO 3.5L with vasopressor support. Anterior segment of  LV shows improved function and is slightly hyperdynamic. Inferior segment  of LV slightly improved. Lateral wall remains hypokinetic and unchanged.  Aortic cannula removed, no dissection present.   Left heart cath on 09/15/2017: Ms. Dame has severe disease in her proximal LAD, mid ramus branch which is large and calcified and AV groove circumflex which is nondominant. I do not think that her ramus branch which subtends a large area of myocardium is percutaneously addressable because of its calcified nature and tortuosity. The best option would be coronary artery bypass grafting. Her LVEDP was 31. Her LVEF was in the 30% range. The sheath was removed and a TR band was placed on the right wrist to achieve patent hemostasis. The patient left the lab in stable condition. TCT S will be notified.   Recent Labs: 02/25/2022: TSH 1.729 03/01/2022: B Natriuretic Peptide 1,093.0; Magnesium 1.8 05/28/2022: ALT 15; BUN 23; Creatinine, Ser 1.12; Hemoglobin 11.0; Platelets 217; Potassium 4.1; Sodium 140  Recent Lipid Panel    Component Value Date/Time   CHOL 152 09/13/2017 0423   TRIG 209 (H) 09/13/2017 0423   HDL 35 (L) 09/13/2017 0423   CHOLHDL 4.3 09/13/2017 0423   VLDL 42 (H) 09/13/2017 0423   LDLCALC 75 09/13/2017 0423     Risk Assessment/Calculations:    CHA2DS2-VASc Score = 6  This indicates a 9.7% annual risk of stroke. The patient's score is based upon: CHF History: 1 HTN History: 1 Diabetes History: 0 Stroke History: 0 Vascular Disease  History: 1 Age Score: 2 Gender Score: 1     Physical Exam:    VS:  There were no vitals taken for this visit.    Wt Readings from Last 3 Encounters:  06/04/22 183 lb 9.6 oz (83.3 kg)  03/27/22 166 lb 3.2 oz (75.4 kg)  03/03/22 166 lb 10.7 oz (75.6 kg)     GEN: Well nourished, well developed in no acute distress HEENT: Normal NECK: No JVD; No carotid bruits CARDIAC: S1/S2, irregular rhythm and regular rate, no murmurs, rubs, gallops; 2+ pulses throughout RESPIRATORY:  Clear to auscultation without rales, wheezing or rhonchi  MUSCULOSKELETAL:  No edema; No deformity  SKIN: Warm and dry NEUROLOGIC:  Alert and oriented x 3 PSYCHIATRIC:  Normal affect   ASSESSMENT:    No diagnosis found.  PLAN:    In order of problems listed above:  Chronic diastolic CHF, HFpEF Echo on 02/25/2022 revealed EF 65 to 70%, no RWMA.  Ventricular septum was flattened in systole and diastole suggesting RV pressure and volume overload, most likely d/t pulmonary hypertension (see below).  RV was not well-visualized, it grossly appeared enlarged with decreased systolic function.  Severely elevated PASP was noted. Euvolemic and well compensated on exam.  Breathing has improved.  Continue Lasix and potassium. Low sodium diet, fluid restriction <2L, and daily weights encouraged. Educated to contact our office for weight gain of 2 lbs overnight or 5 lbs in one week. Heart healthy diet and regular cardiovascular exercise encouraged.   A-fib Rate controlled on exam today.  Denies any tachycardia or palpitations.  Was unable to tolerate carvedilol due to severe bradycardia.  Dr. Carlyle Dolly who saw her in the hospital stated would not add back AV nodal agent at this time.  Not a anticoagulation candidate due to past history of GI bleed. Heart healthy diet and regular cardiovascular exercise encouraged. Continue ASA.   CAD, status post CABG x 3 and NSTEMI, HLD Stable with no anginal symptoms. No indication for  ischemic evaluation. Continue current medication regimen.  No recent lipid panel on file - PCP to manage. At next office visit, if no recent labwork on file, plan to request from PCP. Heart healthy diet and regular cardiovascular exercise encouraged.   Carotid disease Carotid Doppler performed on February 25, 2022 revealed moderate shadowing calcified plaque noted at the left carotid bifurcation with elevated peak systolic velocity suggesting greater than 70% stenosis, right ICA demonstrated less than 50% stenosis.  Denies any symptoms.  Denies any recurrent syncope.  Plan to repeat carotid Doppler in 1 year and plan to request FLP from PCP if labs not available by next OV. Heart healthy diet and regular cardiovascular exercise encouraged.   5. Pulmonary HTN, tricuspid valve regurgitation Echo 02/2022 revealed indeterminant diastolic function, RV pressure volume overload, decreased RV systolic function, severe pulmonary hypertension, moderate to severe TR, dilated IVC.  Most likely secondary due to chronic O2 dependent lung disease with left-sided heart disease.  Dr. Carlyle Dolly stated that the possibility vasodilators having a role will be quite small and suggested not to pursue further testing at this time.  Continue to follow-up with pulmonology.  Plan to repeat echocardiogram in 1 year or sooner if anything changes due to valvular insufficiency.  HTN BP on arrival 144/66, repeat BP 139/65. BP well controlled at home.  Continue amlodipine.  Continue rest of current medication regimen. Will obtain BMET. Heart healthy diet and regular cardiovascular exercise encouraged.   Syncope, blood loss anemia Syncopal episode was felt to be secondary to blood loss anemia. Denies any bleeding issues. Carvedilol d/c d/t bradycardia. Denies any recurrent  episodes of syncope. Heart healthy diet and regular cardiovascular exercise encouraged. Will obtain CBC.   7. Disposition: Follow up with me in 2-3 months or  sooner if anything changes. Discussed with patient to check medications when she gets home to let us know what she is taking.       Medication Adjustments/Labs and Tests Ordered: Current medicines are reviewed at length with the patient today.  Concerns regarding medicines are outlined above.  No orders of the defined types were placed in this encounter.  No orders of the defined types were placed in this encounter.   There are no Patient Instructions on file for this visit.    SignedFinis Bud, NP  06/10/2022 8:27 AM    [place]

## 2022-06-11 ENCOUNTER — Ambulatory Visit (INDEPENDENT_AMBULATORY_CARE_PROVIDER_SITE_OTHER): Payer: PPO | Admitting: Pulmonary Disease

## 2022-06-11 ENCOUNTER — Encounter: Payer: Self-pay | Admitting: Pulmonary Disease

## 2022-06-11 VITALS — BP 148/88 | HR 72 | Ht 65.0 in | Wt 181.4 lb

## 2022-06-11 DIAGNOSIS — K921 Melena: Secondary | ICD-10-CM | POA: Diagnosis not present

## 2022-06-11 DIAGNOSIS — J4489 Other specified chronic obstructive pulmonary disease: Secondary | ICD-10-CM | POA: Diagnosis not present

## 2022-06-11 DIAGNOSIS — I5022 Chronic systolic (congestive) heart failure: Secondary | ICD-10-CM | POA: Diagnosis not present

## 2022-06-11 DIAGNOSIS — I272 Pulmonary hypertension, unspecified: Secondary | ICD-10-CM | POA: Diagnosis not present

## 2022-06-11 DIAGNOSIS — R0683 Snoring: Secondary | ICD-10-CM

## 2022-06-11 DIAGNOSIS — N1832 Chronic kidney disease, stage 3b: Secondary | ICD-10-CM | POA: Diagnosis not present

## 2022-06-11 DIAGNOSIS — G4734 Idiopathic sleep related nonobstructive alveolar hypoventilation: Secondary | ICD-10-CM

## 2022-06-11 DIAGNOSIS — Z6831 Body mass index (BMI) 31.0-31.9, adult: Secondary | ICD-10-CM | POA: Diagnosis not present

## 2022-06-11 DIAGNOSIS — E1143 Type 2 diabetes mellitus with diabetic autonomic (poly)neuropathy: Secondary | ICD-10-CM | POA: Diagnosis not present

## 2022-06-11 DIAGNOSIS — R296 Repeated falls: Secondary | ICD-10-CM | POA: Diagnosis not present

## 2022-06-11 DIAGNOSIS — Z0000 Encounter for general adult medical examination without abnormal findings: Secondary | ICD-10-CM | POA: Diagnosis not present

## 2022-06-11 DIAGNOSIS — J449 Chronic obstructive pulmonary disease, unspecified: Secondary | ICD-10-CM | POA: Diagnosis not present

## 2022-06-11 DIAGNOSIS — M4800 Spinal stenosis, site unspecified: Secondary | ICD-10-CM | POA: Diagnosis not present

## 2022-06-11 DIAGNOSIS — I10 Essential (primary) hypertension: Secondary | ICD-10-CM | POA: Diagnosis not present

## 2022-06-11 DIAGNOSIS — J9611 Chronic respiratory failure with hypoxia: Secondary | ICD-10-CM | POA: Diagnosis not present

## 2022-06-11 NOTE — Progress Notes (Signed)
[facility]  Chief Complaint  Patient presents with   Follow-up    Wants to talk about inogen  Breathing about the same since last ov     Past Surgical History:  She  has a past surgical history that includes Hernia repair; Total knee arthroplasty; Shoulder surgery; LEFT HEART CATH AND CORONARY ANGIOGRAPHY (N/A, 09/15/2017); Coronary artery bypass graft (N/A, 09/18/2017); TEE without cardioversion (N/A, 09/18/2017); Back surgery (2016); Eye surgery (Bilateral); Vaginal hysterectomy; Rotator cuff repair (Left); Lymph node dissection (Right, 05/13/2019); Intercostal nerve block (Right, 05/13/2019); Givens capsule study (N/A, 02/27/2022); Esophagogastroduodenoscopy (egd) with propofol (N/A, 02/26/2022); Colonoscopy with propofol (N/A, 02/27/2022); and polypectomy (02/27/2022).  Past Medical History:  OA, CAD, Anemia, GERD, Glaucoma, Hiatal hernia, HLD, HTN, ischemic CM, CAD s/p CABG, Post op a fib, PE, DM type 2, NSCLC s/p RULectomy, A fib  Constitutional:  BP (!) 148/88   Pulse 72   Ht 5\' 5"  (1.651 m)   Wt 181 lb 6.4 oz (82.3 kg)   SpO2 (!) 88% Comment: ra  BMI 30.19 kg/m   Brief Summary:  Karen Tate is a 77 y.o. female former smoker with with dyspnea.  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      Subjective:   She is here with her husband.  She was in hospital in December for syncope with GI bleeding and new onset A fib.  Echo showed elevated PASP.  She uses her oxygen intermittently during the day and at night.  She wants a POC, but was told by her DME that they don't have them.  She doesn't remember which DME she uses.  Not having cough, wheeze, sputum, or chest congestion.    Her husband says she snores at night and wakes up short of breath.  She does feel sleepy during the day.  Physical Exam:   Appearance - well kempt, sitting in a  wheelchair  ENMT - no sinus tenderness, no oral exudate, no LAN, Mallampati 3 airway, no stridor  Respiratory - equal breath sounds bilaterally, no wheezing or rales  CV - irregular, systolic click  Ext - no clubbing, no edema  Skin - no rashes  Psych - normal mood and affect      Pulmonary testing:  PFT 05/12/19 >> FEV1 1.02 (59%), FEV1% 79, TLC 3.07 (60%), FEV1% 63% PFT 01/23/21 >> FEV1 0.98 (56%), FEV1% 82, TLC 2.72 (52%), DLCO 27%  Chest Imaging:  CT angio chest 07/21/20 >> CHF pattern CT chest 12/18/20 >> s/p Rt upper lobectomy, trace Rt effusion, fatty liver, atherosclerosis CT chest 11/08/21 >> resolution of LUL nodule CT chest 05/29/22 >> stable changes after Rt upper lobectomy, atherosclerosis  Sleep Tests:  ONO with RA 09/08/20 >> test time 6 hrs 2 min.  Basal SpO2 99%, low SpO2 96%.  Cardiac Tests:  Echo 02/25/22 >> EF 65 to 70%, flattened ventricular septum, severe elevation in PASP, mild MR, mod/severe TR, mod/severe TS  Social History:  She  reports that she quit smoking about 44 years ago. Her smoking use included cigarettes. She has a 17.50 pack-year smoking history. She has never been exposed to tobacco smoke. She has never used smokeless tobacco. She reports that she does not drink alcohol and does not use drugs.  Family History:  Her family history includes Anemia in her daughter; Bladder Cancer in her son; Breast cancer in her mother; Congestive Heart Failure in her  brother, son, and son; Diabetes in her daughter and father; Heart disease in her father; Hypertension in her daughter; Migraines in her daughter; Seizures in her daughter.     Assessment/Plan:   COPD with asthma. - breo copay was too expensive - continue advair 250 one puff bid - prn albuterol  Chronic respiratory failure with hypoxia. - goal SpO2 > 90% - 2 liters with exertion and sleep at night - she will let us know which DME she uses for supplies, and then see if she can get a  POC  Snoring. - associated with apnea, sleep disruption and daytime sleepiness - she has history of CAD, hypertension, and pulmonary hypertension - will arrange for a home sleep study to assess for obstructive sleep apnea; advised her not to use her nocturnal oxygen on the night of the home sleep study  Pulmonary hypertension. 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 - followed by Dr. Delton Coombes with [facility]  Coronary artery disease, Valvular heart disease, Chronic diastolic CHF, Atrial fibrillation. - followed by Dr. Carlyle Dolly with [facility]  Time Spent Involved in Patient Care on Day of Examination:  37 minutes  Follow up:   Patient Instructions  Will arrange for a home sleep study Will call to arrange for follow up after sleep study reviewed   Medication List:   Allergies as of 06/11/2022       Reactions   Beta Adrenergic Blockers Other (See Comments)   Dropped heart rate to the 40's   Calcium Channel Blockers Other (See Comments)   Dropped HR into the 40's   Naproxen Hives        Medication List        Accurate as of June 11, 2022 10:16 AM. If you have any questions, ask your nurse or doctor.          acetaminophen 500 MG tablet Commonly known as: TYLENOL Take 1,000 mg by mouth every 6 (six) hours as needed for moderate pain.   albuterol 108 (90 Base) MCG/ACT inhaler Commonly known as: VENTOLIN HFA Inhale 2 puffs into the lungs every 6 (six) hours as needed for wheezing or shortness of breath.   ALPRAZolam 0.5 MG tablet Commonly known as: XANAX Take 0.5 mg by mouth 2 (two) times daily.   amLODipine 5 MG tablet Commonly known as: NORVASC Take 1 tablet (5 mg total) by mouth daily.   aspirin EC 81 MG tablet Take 1 tablet (81 mg total) by mouth daily. Swallow whole.   atorvastatin 80 MG tablet Commonly known  as: LIPITOR Take 1 tablet (80 mg total) by mouth daily at 6 PM.   fish oil-omega-3 fatty acids 1000 MG capsule Take 1 g by mouth 3 (three) times daily.   fluticasone-salmeterol 250-50 MCG/ACT Aepb Commonly known as: ADVAIR INHALE 1 DOSE BY MOUTH TWICE DAILY (MORNING AND BEDTIME)   folic acid 1 MG tablet Commonly known as: FOLVITE TAKE 1 TABLET EVERY DAY   furosemide 20 MG tablet Commonly known as: LASIX Take 2 tablets (40 mg total) by mouth in the morning.   magnesium oxide 400 MG tablet Commonly known as: MAG-OX Take 1 tablet (400 mg total) by mouth daily.   meclizine 25 MG tablet Commonly known as: ANTIVERT Take 25 mg by mouth 3 (three) times daily as needed for dizziness.   omeprazole 40 MG capsule Commonly known as: PRILOSEC Take  40 mg by mouth daily.   potassium chloride SA 20 MEQ tablet Commonly known as: KLOR-CON M Take 1 tablet (20 mEq total) by mouth daily.        Signature:  Chesley Mires, MD [place] Pager - [phone] 06/11/2022, 10:16 AM

## 2022-06-11 NOTE — Patient Instructions (Signed)
Will arrange for a home sleep study Will call to arrange for follow up after sleep study reviewed  

## 2022-06-12 ENCOUNTER — Telehealth: Payer: Self-pay | Admitting: *Deleted

## 2022-06-12 DIAGNOSIS — J4489 Other specified chronic obstructive pulmonary disease: Secondary | ICD-10-CM

## 2022-06-12 DIAGNOSIS — R0609 Other forms of dyspnea: Secondary | ICD-10-CM

## 2022-06-12 NOTE — Telephone Encounter (Signed)
From yesterdays ov note:  Chronic respiratory failure with hypoxia. - goal SpO2 > 90% - 2 liters with exertion and sleep at night - she will let us know which DME she uses for supplies, and then see if she can get a POC  Patient uses [facility] as a DME.   Dr. Halford Chessman are you okay with [place] placing the order for the Inogen machine to [facility]?

## 2022-06-12 NOTE — Telephone Encounter (Signed)
If her original set up was in 2017 then she should be eligible for new set up.  Okay to place order for Inogen POC at 2 liters 24/7.

## 2022-06-12 NOTE — Telephone Encounter (Signed)
Order for POC placed.

## 2022-06-12 NOTE — Telephone Encounter (Signed)
Patient's daughter wanted to inform Dr. Halford Chessman of portable o2 machine information.   Patient used [employer] [phone] and received portable O2 machine back in 2017.   Please call patient's daughter if any more information is needed. Corena Herter [phone]

## 2022-06-13 ENCOUNTER — Telehealth: Payer: Self-pay | Admitting: Pulmonary Disease

## 2022-06-13 NOTE — Telephone Encounter (Signed)
Advised patient she is not eligible for new poc machine until 2026 since she received last one in 2021. She verbalized understanding. NFN

## 2022-06-13 NOTE — Telephone Encounter (Signed)
Okay.  Her family informed us that set up was in 2017.  Please let her know that her set up was actually in 2021, and she wouldn't be eligible for new set up until 2026.

## 2022-06-13 NOTE — Telephone Encounter (Signed)
Dr. Halford Chessman  Please see previous message from [place]. Please advise

## 2022-06-14 DIAGNOSIS — J441 Chronic obstructive pulmonary disease with (acute) exacerbation: Secondary | ICD-10-CM | POA: Diagnosis not present

## 2022-06-14 DIAGNOSIS — U071 COVID-19: Secondary | ICD-10-CM | POA: Diagnosis not present

## 2022-06-27 ENCOUNTER — Ambulatory Visit: Payer: PPO | Admitting: Cardiology

## 2022-07-03 ENCOUNTER — Telehealth: Payer: Self-pay | Admitting: Pulmonary Disease

## 2022-07-03 MED ORDER — BENZONATATE 200 MG PO CAPS: 200.0000 mg | ORAL_CAPSULE | Freq: Three times a day (TID) | ORAL | 1 refills | Status: DC | PRN

## 2022-07-03 NOTE — Telephone Encounter (Signed)
Patient states having symptoms of cough. Would like something called into pharmacy. Pharmacy is [facility]. Patient phone number is [phone].

## 2022-07-03 NOTE — Telephone Encounter (Signed)
Spoke with pt who states she has had a productive cough (thin clear mucus) x 2 weeks. She denies fever/ chills/ GI upset. Pt denies increased SOB or wheeze. Pt uses Advair as directed. Dr. Craige Cotta please advise. Pt is ok with waiting until tomorrow 07/04/22 for response. Pt is aware that if symptoms get worse she needs to go to ED.

## 2022-07-03 NOTE — Telephone Encounter (Signed)
Spoke to pt and informed her of what Dr Craige Cotta stated. Pt verbalized understanding. Sent Benzonatate (TESSALON) 200 mg to [facility] in [place]. Nothing further needed.

## 2022-07-03 NOTE — Telephone Encounter (Signed)
She can try over the counter mucinex.  Can also send in script for tessalon 200 mg tid prn, #30 with 1 refill.

## 2022-07-08 ENCOUNTER — Other Ambulatory Visit: Payer: Self-pay | Admitting: Pulmonary Disease

## 2022-08-05 ENCOUNTER — Ambulatory Visit: Payer: PPO | Admitting: Cardiology

## 2022-08-05 NOTE — Progress Notes (Deleted)
Clinical Summary Karen Tate is a 77 y.o.female  seen today for follow up of the following medical problems.    1. CAD - prior CABG in 08/2017 (left internal mammary artery to left anterior descending, sequential saphenous vein graft to ramus intermedius branches 1 and 2). She presented with an NSTEMI -  10/2018 echo LVEF 60-65%     - no recent chest pains. Chronic SOB unchanged - compliant with meds         2. Carotid stenosis - 6/20219 carotid US: RICA 1-39%, LICA 40-59%  01/2020 RICA 1-39%, LICA 40-59% 01-2021 RICA 1-39%, LICA 40-59% - 05/2021 carotid US RICA 1-39%, LICA 40-59%   3. Hyperlipidemia - compliant with meds - last labs with pcp   4. HTN -compliant with meds     5. Mitral regurgitation - 10/2018 echo moderate MR - 06/2020 echo mod MR, mod TR   6. Chronic diastolic HF - 06/2020 admission with SOB, thought to be volume overloaded and diuresed - 06/2020 echo LVEF 60-65%, grade II dd, mild RV dysfunction, PASP 61 mmHg       08/01/21 ER visit mild pulm edema. BNP 315 - taking lasix 20mg  bid. No LE edema, no SOB/DOE - home weight 181 lbs, had been 175 lbs about 2 months ago.      - taking lasix 40mg  daily. Some ongoing LE edema but higher diuretic dosing caused cramping   7. Pulmonary HTN - 06/2020 echo LVEF 60-65%, grade II dd, mild RV dysfunction, PASP 61 mmHg 02/2022 echo: LVEF 65-70%, no WMAs, flattened ventricul septum systole/diastole, severe pulm HTN, mod to severe TR.    8. Lung cancer - history of stage Ia nonsmall cell lung CA - prior RUL lobectomy   9. History of PE   10. COPD - 04/2019 PFTs moderate obstruction, severe restriction, moderate diffusion defect - followed by Dr Craige Cotta pulmonary Past Medical History:  Diagnosis Date   Anxiety neurosis    Arthritis    Asthma    CAD (coronary artery disease)    a. 08/2017: abnormal nuc-> sent directly to [facility], NSTEMI, s/p CABG (left internal mammary artery to left anterior descending,  sequential saphenous vein graft to ramus intermedius branches 1 and 2).   Carotid artery disease (HCC)    Chronic anemia    Dizziness    GERD (gastroesophageal reflux disease)    Glaucoma    Heart disease    Hiatal hernia    Hyperlipemia    Hypertension    Ischemic cardiomyopathy    Joint pain    Mitral regurgitation    Postoperative atrial fibrillation (HCC) 08/2017   Pulmonary embolus (HCC)    Type 2 diabetes mellitus (HCC)      Allergies  Allergen Reactions   Beta Adrenergic Blockers Other (See Comments)    Dropped heart rate to the 40's   Calcium Channel Blockers Other (See Comments)    Dropped HR into the 40's   Naproxen Hives     Current Outpatient Medications  Medication Sig Dispense Refill   acetaminophen (TYLENOL) 500 MG tablet Take 1,000 mg by mouth every 6 (six) hours as needed for moderate pain.     albuterol (VENTOLIN HFA) 108 (90 Base) MCG/ACT inhaler Inhale 2 puffs into the lungs every 6 (six) hours as needed for wheezing or shortness of breath.     ALPRAZolam (XANAX) 0.5 MG tablet Take 0.5 mg by mouth 2 (two) times daily.     amLODipine (NORVASC) 5 MG  tablet Take 1 tablet (5 mg total) by mouth daily. 30 tablet 1   aspirin EC 81 MG tablet Take 1 tablet (81 mg total) by mouth daily. Swallow whole. 30 tablet 12   atorvastatin (LIPITOR) 80 MG tablet Take 1 tablet (80 mg total) by mouth daily at 6 PM. 90 tablet 3   benzonatate (TESSALON) 200 MG capsule Take 1 capsule (200 mg total) by mouth 3 (three) times daily as needed for cough. 30 capsule 1   fish oil-omega-3 fatty acids 1000 MG capsule Take 1 g by mouth 3 (three) times daily.      fluticasone-salmeterol (ADVAIR) 250-50 MCG/ACT AEPB INHALE 1 DOSE BY MOUTH TWICE DAILY (MORNING  AND  BEDTIME) 60 each 0   folic acid (FOLVITE) 1 MG tablet TAKE 1 TABLET EVERY DAY (Patient taking differently: Take 1 mg by mouth daily.) 90 tablet 3   furosemide (LASIX) 20 MG tablet Take 2 tablets (40 mg total) by mouth in the  morning.     magnesium oxide (MAG-OX) 400 MG tablet Take 1 tablet (400 mg total) by mouth daily.     meclizine (ANTIVERT) 25 MG tablet Take 25 mg by mouth 3 (three) times daily as needed for dizziness.      omeprazole (PRILOSEC) 40 MG capsule Take 40 mg by mouth daily.     potassium chloride SA (KLOR-CON M) 20 MEQ tablet Take 1 tablet (20 mEq total) by mouth daily.     No current facility-administered medications for this visit.     Past Surgical History:  Procedure Laterality Date   BACK SURGERY  2016   COLONOSCOPY WITH PROPOFOL N/A 02/27/2022   Procedure: COLONOSCOPY WITH PROPOFOL;  Surgeon: Corbin Ade, MD;  Location: [facility];  Service: Endoscopy;  Laterality: N/A;   CORONARY ARTERY BYPASS GRAFT N/A 09/18/2017   Procedure: CORONARY ARTERY BYPASS GRAFTING (CABG);  Surgeon: Loreli Slot, MD;  Location[facility] OR;  Service: Open Heart Surgery;  Laterality: N/A;  Saphenous vein harvest. LIMA to LAD Saphenous vein (sequential) ramus 1 & 2   ESOPHAGOGASTRODUODENOSCOPY (EGD) WITH PROPOFOL N/A 02/26/2022   Procedure: ESOPHAGOGASTRODUODENOSCOPY (EGD) WITH PROPOFOL;  Surgeon: Dolores Frame, MD;  Location: [facility];  Service: Gastroenterology;  Laterality: N/A;   EYE SURGERY Bilateral    "surgery for glaucoma"   GIVENS CAPSULE STUDY N/A 02/27/2022   Procedure: GIVENS CAPSULE STUDY;  Surgeon: Corbin Ade, MD;  Location: [facility];  Service: Endoscopy;  Laterality: N/A;  TO BE DEPLOYED AT 1700 TODAY   HERNIA REPAIR     INTERCOSTAL NERVE BLOCK Right 05/13/2019   Procedure: Intercostal Nerve Block;  Surgeon: Loreli Slot, MD;  Location: [facility] OR;  Service: Thoracic;  Laterality: Right;   LEFT HEART CATH AND CORONARY ANGIOGRAPHY N/A 09/15/2017   Procedure: LEFT HEART CATH AND CORONARY ANGIOGRAPHY;  Surgeon: Runell Gess, MD;  Location: [facility];  Service: Cardiovascular;  Laterality: N/A;   LYMPH NODE DISSECTION Right 05/13/2019   Procedure:  Lymph Node Dissection;  Surgeon: Loreli Slot, MD;  Location: [facility] OR;  Service: Thoracic;  Laterality: Right;   POLYPECTOMY  02/27/2022   Procedure: POLYPECTOMY;  Surgeon: Corbin Ade, MD;  Location: [facility];  Service: Endoscopy;;   ROTATOR CUFF REPAIR Left    x2   SHOULDER SURGERY     LEFT   TEE WITHOUT CARDIOVERSION N/A 09/18/2017   Procedure: TRANSESOPHAGEAL ECHOCARDIOGRAM (TEE);  Surgeon: Loreli Slot, MD;  Location[facility];  Service: Open Heart Surgery;  Laterality: N/A;   TOTAL KNEE ARTHROPLASTY     LEFT   VAGINAL HYSTERECTOMY     partial     Allergies  Allergen Reactions   Beta Adrenergic Blockers Other (See Comments)    Dropped heart rate to the 40's   Calcium Channel Blockers Other (See Comments)    Dropped HR into the 40's   Naproxen Hives      Family History  Problem Relation Age of Onset   Breast cancer Mother    Diabetes Father    Heart disease Father    Congestive Heart Failure Brother    Anemia Daughter    Seizures Daughter    Migraines Daughter    Diabetes Daughter    Hypertension Daughter    Bladder Cancer Son    Congestive Heart Failure Son    Congestive Heart Failure Son    Colon cancer Neg Hx    Colon polyps Neg Hx      Social History Ms. Grieshop reports that she quit smoking about 44 years ago. Her smoking use included cigarettes. She has a 17.50 pack-year smoking history. She has never been exposed to tobacco smoke. She has never used smokeless tobacco. Ms. Critcher reports no history of alcohol use.   Review of Systems CONSTITUTIONAL: No weight loss, fever, chills, weakness or fatigue.  HEENT: Eyes: No visual loss, blurred vision, double vision or yellow sclerae.No hearing loss, sneezing, congestion, runny nose or sore throat.  SKIN: No rash or itching.  CARDIOVASCULAR:  RESPIRATORY: No shortness of breath, cough or sputum.  GASTROINTESTINAL: No anorexia, nausea, vomiting or diarrhea. No abdominal pain or blood.   GENITOURINARY: No burning on urination, no polyuria NEUROLOGICAL: No headache, dizziness, syncope, paralysis, ataxia, numbness or tingling in the extremities. No change in bowel or bladder control.  MUSCULOSKELETAL: No muscle, back pain, joint pain or stiffness.  LYMPHATICS: No enlarged nodes. No history of splenectomy.  PSYCHIATRIC: No history of depression or anxiety.  ENDOCRINOLOGIC: No reports of sweating, cold or heat intolerance. No polyuria or polydipsia.  Marland Kitchen   Physical Examination There were no vitals filed for this visit. There were no vitals filed for this visit.  Gen: resting comfortably, no acute distress HEENT: no scleral icterus, pupils equal round and reactive, no palptable cervical adenopathy,  CV Resp: Clear to auscultation bilaterally GI: abdomen is soft, non-tender, non-distended, normal bowel sounds, no hepatosplenomegaly MSK: extremities are warm, no edema.  Skin: warm, no rash Neuro:  no focal deficits Psych: appropriate affect   Diagnostic Studies  Echocardiogram 11/18/2018:  1. The left ventricle has normal systolic function with an ejection  fraction of 60-65%. The cavity size was normal. Left ventricular diastolic  parameters were normal.   2. The right ventricle has mildly reduced systolic function. The cavity  was normal. There is no increase in right ventricular wall thickness.  Right ventricular systolic pressure is moderately elevated with an  estimated pressure of 49.5 mmHg.   3. The aortic valve is tricuspid. Mild calcification of the aortic valve.  Mild to moderate aortic annular calcification noted.   4. The mitral valve is degenerative. Mild thickening of the mitral valve  leaflet. Mild calcification of the mitral valve leaflet. Mitral valve  regurgitation is moderate by color flow Doppler. The MR jet is eccentric  posteriorly directed.   5. The tricuspid valve is grossly normal.   6. The aorta is normal unless otherwise noted.       01/2020 carotid US  Summary:  Right Carotid: Velocities in the right ICA are consistent with a 1-39%  stenosis.                 The ECA appears >50% stenosed.   Left Carotid: Velocities in the left ICA are consistent with a 40-59%  stenosis.   Vertebrals:  Bilateral vertebral arteries demonstrate antegrade flow.  Subclavians: Normal flow hemodynamics were seen in bilateral subclavian               arteries.    06/2020 echo   IMPRESSIONS     1. Left ventricular ejection fraction, by estimation, is 60 to 65%. The  left ventricle has normal function. The left ventricle demonstrates  regional wall motion abnormalities (see scoring diagram/findings for  description). Left ventricular diastolic  parameters are consistent with Grade II diastolic dysfunction  (pseudonormalization).   2. Right ventricular systolic function is mildly reduced. The right  ventricular size is normal. There is severely elevated pulmonary artery  systolic pressure. The estimated right ventricular systolic pressure is  61.3 mmHg.   3. The mitral valve is abnormal. Moderate mitral valve regurgitation.   4. Tricuspid valve regurgitation is moderate.   5. The aortic valve is tricuspid. There is mild calcification of the  aortic valve. Aortic valve regurgitation is not visualized.   6. The inferior vena cava is normal in size with <50% respiratory  variability, suggesting right atrial pressure of 8 mmHg.    Assessment and Plan  1. CAD - no recent symptoms, continue current meds   2. HTN - bp at goal, continue current meds   3. Hyperlipidemia - request labs from pcp, continue current meds   5. Carotid stenosis - stable mild to moderate disease - we will repeat US Spring 2024   6. Pulmonary HTN - noted by recent echo. Most likely secondary to her chronic O2 dependent lung disease, left sided heart disease. I think the possibility of vasodilators having a role is quite small, would not pursue further  testing at this time   7. Chronic diastolic HF - mild fluid overload, diuretic dosing limited by cramping. Encouraged to try to take additional lasix fwe times a week as tolerated.         Antoine Poche, M.D., F.A.C.C.

## 2022-08-09 ENCOUNTER — Ambulatory Visit: Payer: PPO | Attending: Cardiology | Admitting: Nurse Practitioner

## 2022-08-09 ENCOUNTER — Encounter: Payer: Self-pay | Admitting: Nurse Practitioner

## 2022-08-09 VITALS — BP 156/72 | HR 85 | Ht 65.0 in | Wt 179.0 lb

## 2022-08-09 DIAGNOSIS — I5032 Chronic diastolic (congestive) heart failure: Secondary | ICD-10-CM

## 2022-08-09 DIAGNOSIS — I2510 Atherosclerotic heart disease of native coronary artery without angina pectoris: Secondary | ICD-10-CM | POA: Diagnosis not present

## 2022-08-09 DIAGNOSIS — E782 Mixed hyperlipidemia: Secondary | ICD-10-CM | POA: Diagnosis not present

## 2022-08-09 DIAGNOSIS — I4891 Unspecified atrial fibrillation: Secondary | ICD-10-CM

## 2022-08-09 DIAGNOSIS — R630 Anorexia: Secondary | ICD-10-CM

## 2022-08-09 DIAGNOSIS — Z79899 Other long term (current) drug therapy: Secondary | ICD-10-CM

## 2022-08-09 DIAGNOSIS — I10 Essential (primary) hypertension: Secondary | ICD-10-CM

## 2022-08-09 DIAGNOSIS — I071 Rheumatic tricuspid insufficiency: Secondary | ICD-10-CM

## 2022-08-09 DIAGNOSIS — I272 Pulmonary hypertension, unspecified: Secondary | ICD-10-CM

## 2022-08-09 DIAGNOSIS — I6523 Occlusion and stenosis of bilateral carotid arteries: Secondary | ICD-10-CM

## 2022-08-09 MED ORDER — AMLODIPINE BESYLATE 10 MG PO TABS: 10.0000 mg | ORAL_TABLET | Freq: Every day | ORAL | 1 refills | Status: AC

## 2022-08-09 MED ORDER — EZETIMIBE 10 MG PO TABS: 10.0000 mg | ORAL_TABLET | Freq: Every day | ORAL | 6 refills | Status: DC

## 2022-08-09 NOTE — Progress Notes (Unsigned)
Cardiology Office Note:    Date:  08/09/2022  ID:  Karen Tate, DOB 11-Nov-1945, MRN [id number]  PCP:  Toma Deiters, MD   [facility] Providers Cardiologist:  Dina Rich, MD     Referring MD: Toma Deiters, MD   CC: Here for follow-up  History of Present Illness:    Karen Tate is a 77 y.o. female with a hx of the following:  CAD, status post CABG x 3 and NSTEMI Carotid artery disease Chronic diastolic CHF MR History of pulmonary embolus A-fib Hypertension History of lung cancer, s/p RUL lobectomy COPD (followed by pulmonology) Pulmonary HTN Hx of GI bleed, anemia Type 2 diabetes  Patient is a 77 year old female with past medical history as mentioned above.  History of remote CABG in 2019 LIMA to LAD, sequential saphenous vein graft to RI branches wanted to.  Presented with an NSTEMI, echocardiogram in 2020 revealed normal EF.  Most recent carotid ultrasound revealed 1 to 39% stenosis along right ICA, 40 to 59% stenosis along left ICA.  Was admitted in 2022 for shortness of breath, was thought to be volume overloaded and diuresed.  Echocardiogram in 2022 revealed normal EF, grade 2 DD, mild RV dysfunction, PASP 61 mmHg.  In May 2023 she had an ER visit for mild pulmonary edema.  BNP 315.  Trops negative. EKG showed A-fib, rate controlled, nonspecific ST/T wave abnormality.  Received IV Lasix, Flexeril, and Tylenol in the ED.  Was discharged later in stable condition.  Was admitted in December 2023 for chief complaint of weakness and syncope.  Episode lasted 2 minutes, no postictal symptoms, no seizure-like activity.  Patient stated she had increasing shortness of breath and dyspnea on exertion for past 2 months, worse in past 2 to 3 weeks.  Denied any flulike symptoms, chest pain, hemoptysis, or coughing.  She did have some slight signs of fluid overload.  Cardiology was following.  She received IV Lasix.  Repeat echocardiogram revealed normal EF, no RWMA,  RV overload, moderate to severe TR.  A-fib with heart rate from 40-60, Coreg was held due to severe bradycardia.  Anticoagulation was held due to melena/acute blood loss anemia.  Syncopal episode was likely secondary to blood loss anemia with drop in hemoglobin.  She was given blood transfusion.  Underwent EGD that showed normal stomach/duodenum, hiatal hernia, colonoscopy did not reveal any source of bleeding found.  Underwent CT enteroscopy.  Small adenoma was removed by colon, prominent vascular pattern small bowel without suspect bleeding lesion being seen VCE.  CTE enteroscopy demonstrated no evidence of small bowel tumor.  GI team suspected she most likely had blood loss from small bowel related to vascular lesion such as a dieulafoy.  GI recommended that she may continue baby aspirin.  Was hyperkalemic during hospital, given [facility] and this resolved potassium level.  Lisinopril was held due to hyperkalemia.  Carotid Doppler revealed less than 50% right ICA. stenosis, left ICA 70%.  Was discharged in stable condition on March 03, 2022.  Today she presents for follow-up with husband.  She states she has been doing well. Denies any recurrent syncopal episodes episodes. Denies any chest pain, shortness of breath, palpitations, syncope, presyncope, dizziness, orthopnea, PND, swelling or significant weight changes, acute bleeding, or claudication. Does admit to poor appetite, eats one meal per day.   Past Medical History:  Diagnosis Date   Anxiety neurosis    Arthritis    Asthma    CAD (coronary artery disease)  a. 08/2017: abnormal nuc-> sent directly to [facility], NSTEMI, s/p CABG (left internal mammary artery to left anterior descending, sequential saphenous vein graft to ramus intermedius branches 1 and 2).   Carotid artery disease (HCC)    Chronic anemia    Dizziness    GERD (gastroesophageal reflux disease)    Glaucoma    Heart disease    Hiatal hernia    Hyperlipemia    Hypertension     Ischemic cardiomyopathy    Joint pain    Mitral regurgitation    Postoperative atrial fibrillation (HCC) 08/2017   Pulmonary embolus (HCC)    Type 2 diabetes mellitus (HCC)     Past Surgical History:  Procedure Laterality Date   BACK SURGERY  2016   COLONOSCOPY WITH PROPOFOL N/A 02/27/2022   Procedure: COLONOSCOPY WITH PROPOFOL;  Surgeon: Corbin Ade, MD;  [facility];  Service: Endoscopy;  Laterality: N/A;   CORONARY ARTERY BYPASS GRAFT N/A 09/18/2017   Procedure: CORONARY ARTERY BYPASS GRAFTING (CABG);  Surgeon: Loreli Slot, MD;  Location: [facility] OR;  Service: Open Heart Surgery;  Laterality: N/A;  Saphenous vein harvest. LIMA to LAD Saphenous vein (sequential) ramus 1 & 2   ESOPHAGOGASTRODUODENOSCOPY (EGD) WITH PROPOFOL N/A 02/26/2022   Procedure: ESOPHAGOGASTRODUODENOSCOPY (EGD) WITH PROPOFOL;  Surgeon: Dolores Frame, MD;  [facility];  Service: Gastroenterology;  Laterality: N/A;   EYE SURGERY Bilateral    "surgery for glaucoma"   GIVENS CAPSULE STUDY N/A 02/27/2022   Procedure: GIVENS CAPSULE STUDY;  Surgeon: Corbin Ade, MD;  [facility]: [facility];  Service: Endoscopy;  Laterality: N/A;  TO BE DEPLOYED AT 1700 TODAY   HERNIA REPAIR     INTERCOSTAL NERVE BLOCK Right 05/13/2019   Procedure: Intercostal Nerve Block;  Surgeon: Loreli Slot, MD;  Location: [facility] OR;  Service: Thoracic;  Laterality: Right;   LEFT HEART CATH AND CORONARY ANGIOGRAPHY N/A 09/15/2017   Procedure: LEFT HEART CATH AND CORONARY ANGIOGRAPHY;  Surgeon: Runell Gess, MD;  Location: [facility];  Service: Cardiovascular;  Laterality: N/A;   LYMPH NODE DISSECTION Right 05/13/2019   Procedure: Lymph Node Dissection;  Surgeon: Loreli Slot, MD;  Location: [facility] OR;  Service: Thoracic;  Laterality: Right;   POLYPECTOMY  02/27/2022   Procedure: POLYPECTOMY;  Surgeon: Corbin Ade, MD;  [facility];  Service: Endoscopy;;   ROTATOR CUFF  REPAIR Left    x2   SHOULDER SURGERY     LEFT   TEE WITHOUT CARDIOVERSION N/A 09/18/2017   Procedure: TRANSESOPHAGEAL ECHOCARDIOGRAM (TEE);  Surgeon: Loreli Slot, MD;  Location: [facility] OR;  Service: Open Heart Surgery;  Laterality: N/A;   TOTAL KNEE ARTHROPLASTY     LEFT   VAGINAL HYSTERECTOMY     partial    Current Medications: Current Meds  Medication Sig   acetaminophen (TYLENOL) 500 MG tablet Take 1,000 mg by mouth every 6 (six) hours as needed for moderate pain.   albuterol (VENTOLIN HFA) 108 (90 Base) MCG/ACT inhaler Inhale 2 puffs into the lungs every 6 (six) hours as needed for wheezing or shortness of breath.   ALPRAZolam (XANAX) 0.5 MG tablet Take 0.5 mg by mouth 2 (two) times daily.   amLODipine (NORVASC) 5 MG tablet Take 1 tablet (5 mg total) by mouth daily.   aspirin EC 81 MG tablet Take 1 tablet (81 mg total) by mouth daily. Swallow whole.   atorvastatin (LIPITOR) 80 MG tablet Take 1 tablet (80  mg total) by mouth daily at 6 PM.   benzonatate (TESSALON) 200 MG capsule Take 1 capsule (200 mg total) by mouth 3 (three) times daily as needed for cough.   fish oil-omega-3 fatty acids 1000 MG capsule Take 1 g by mouth 3 (three) times daily.    fluticasone-salmeterol (ADVAIR) 250-50 MCG/ACT AEPB INHALE 1 DOSE BY MOUTH TWICE DAILY (MORNING  AND  BEDTIME)   folic acid (FOLVITE) 1 MG tablet TAKE 1 TABLET EVERY DAY (Patient taking differently: Take 1 mg by mouth daily.)   furosemide (LASIX) 20 MG tablet Take 2 tablets (40 mg total) by mouth in the morning.   magnesium oxide (MAG-OX) 400 MG tablet Take 1 tablet (400 mg total) by mouth daily.   meclizine (ANTIVERT) 25 MG tablet Take 25 mg by mouth 3 (three) times daily as needed for dizziness.    omeprazole (PRILOSEC) 40 MG capsule Take 40 mg by mouth daily.   potassium chloride SA (KLOR-CON M) 20 MEQ tablet Take 1 tablet (20 mEq total) by mouth daily.     Allergies:   Beta adrenergic blockers, Calcium channel blockers, and  Naproxen   Social History   Socioeconomic History   Marital status: Married    Spouse name: Adela Lank   Number of children: 5   Years of education: Not on file   Highest education level: Not on file  Occupational History   Not on file  Tobacco Use   Smoking status: Former    Packs/day: 0.50    Years: 35.00    Additional pack years: 0.00    Total pack years: 17.50    Types: Cigarettes    Quit date: 03/25/1978    Years since quitting: 44.4    Passive exposure: Never   Smokeless tobacco: Never   Tobacco comments:    quit more than 30 years ago  Vaping Use   Vaping Use: Never used  Substance and Sexual Activity   Alcohol use: No   Drug use: No   Sexual activity: Not on file  Other Topics Concern   Not on file  Social History Narrative   Not on file   Social Determinants of Health   Financial Resource Strain: Low Risk  (06/29/2019)   Overall Financial Resource Strain (CARDIA)    Difficulty of Paying Living Expenses: Not hard at all  Food Insecurity: No Food Insecurity (02/25/2022)   Hunger Vital Sign    Worried About Running Out of Food in the Last Year: Never true    Ran Out of Food in the Last Year: Never true  Transportation Needs: No Transportation Needs (02/25/2022)   PRAPARE - Administrator, Civil Service (Medical): No    Lack of Transportation (Non-Medical): No  Physical Activity: Inactive (06/29/2019)   Exercise Vital Sign    Days of Exercise per Week: 0 days    Minutes of Exercise per Session: 0 min  Stress: No Stress Concern Present (06/29/2019)   [employer] of Occupational Health - Occupational Stress Questionnaire    Feeling of Stress : Not at all  Social Connections: Moderately Integrated (06/29/2019)   Social Connection and Isolation Panel [NHANES]    Frequency of Communication with Friends and Family: More than three times a week    Frequency of Social Gatherings with Friends and Family: More than three times a week    Attends Religious  Services: More than 4 times per year    Active Member of Clubs or Organizations: No  Attends Banker Meetings: Never    Marital Status: Married     Family History: The patient's family history includes Anemia in her daughter; Bladder Cancer in her son; Breast cancer in her mother; Congestive Heart Failure in her brother, son, and son; Diabetes in her daughter and father; Heart disease in her father; Hypertension in her daughter; Migraines in her daughter; Seizures in her daughter. There is no history of Colon cancer or Colon polyps.  ROS:     Please see the history of present illness.    All other systems reviewed and are negative.  EKGs/Labs/Other Studies Reviewed:    The following studies were reviewed today:   EKG:  EKG is not ordered today.   Echocardiogram on 02/25/2022: 1. Left ventricular ejection fraction, by estimation, is 65 to 70%. The  left ventricle has normal function. The left ventricle has no regional  wall motion abnormalities. Left ventricular diastolic parameters are  indeterminate.   2. Ventricular septum is flattnened in systole and diastole suggesting RV  pressure and volume overload. RV not well visualized, grossly appears  enlarged with decreased systolic function. . Right ventricular systolic  function was not well visualized. The   right ventricular size is not well visualized. There is severely elevated  pulmonary artery systolic pressure.   3. Left atrial size was mildly dilated.   4. The mitral valve is abnormal. Mild mitral valve regurgitation. No  evidence of mitral stenosis. Moderate mitral annular calcification.   5. Moderate to severe TR, hepatic systolic flow reversal would suggest  severe TR. . Tricuspid valve regurgitation is moderate. 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   6. The aortic valve is tricuspid. There is moderate calcification of the  aortic valve. There is moderate thickening of the aortic valve. Aortic  valve  regurgitation is not visualized. No aortic stenosis is present.   7. The pulmonic valve was abnormal.   8. The inferior vena cava is dilated in size with <50% respiratory  variability, suggesting right atrial pressure of 15 mmHg.  Carotid duplex on 02/25/2022: IMPRESSION: 1. 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 2. Less than 50% stenosis of the right internal carotid artery.  TEE on 09/18/2017:  Aortic valve: The valve is trileaflet. Mild valve thickening present.  Mild valve calcification present. No stenosis. No regurgitation.   Mitral valve: Mild regurgitation with central jet.   Right ventricle: Normal wall thickness and ejection fraction. Cavity is  mildly dilated.   Tricuspid valve: Trace regurgitation. The tricuspid valve regurgitation  jet is central.   Left Atrial Appendage: Not well visualized   Pulmonic valve: No regurgitation.   Left ventricle: Estimated EF 35-40%. Severe hypokinesis of the anterior,  anterolateral and inferior wall. Post bypass assessment: Tricuspid, Pulmonic, Mitral and Aortic valves unchanged. LVEF unchanged to  slightly improved. CO 3.5L with vasopressor support. Anterior segment of  LV shows improved function and is slightly hyperdynamic. Inferior segment  of LV slightly improved. Lateral wall remains hypokinetic and unchanged.  Aortic cannula removed, no dissection present.   Left heart cath on 09/15/2017: Ms. Perigo has severe disease in her proximal LAD, mid ramus branch which is large and calcified and AV groove circumflex which is nondominant. I do not think that her ramus branch which subtends a large area of myocardium is percutaneously addressable because of its calcified nature and tortuosity. The best option would be coronary artery bypass grafting.  Her LVEDP was 31. Her LVEF was in the 30% range. The sheath was removed and a TR  band was placed on the right wrist to achieve patent hemostasis. The patient left the lab in stable condition. TCT S will be notified.   Recent Labs: 02/25/2022: TSH 1.729 03/01/2022: B Natriuretic Peptide 1,093.0; Magnesium 1.8 05/28/2022: ALT 15; BUN 23; Creatinine, Ser 1.12; Hemoglobin 11.0; Platelets 217; Potassium 4.1; Sodium 140  Recent Lipid Panel    Component Value Date/Time   CHOL 152 09/13/2017 0423   TRIG 209 (H) 09/13/2017 0423   HDL 35 (L) 09/13/2017 0423   CHOLHDL 4.3 09/13/2017 0423   VLDL 42 (H) 09/13/2017 0423   LDLCALC 75 09/13/2017 0423     Risk Assessment/Calculations:    CHA2DS2-VASc Score = 6  This indicates a 9.7% annual risk of stroke. The patient's score is based upon: CHF History: 1 HTN History: 1 Diabetes History: 0 Stroke History: 0 Vascular Disease History: 1 Age Score: 2 Gender Score: 1     Physical Exam:    VS:  BP (!) 156/72   Pulse 85   Ht 5\' 5"  (1.651 m)   Wt 179 lb (81.2 kg)   SpO2 94%   BMI 29.79 kg/m     Wt Readings from Last 3 Encounters:  08/09/22 179 lb (81.2 kg)  06/11/22 181 lb 6.4 oz (82.3 kg)  06/04/22 183 lb 9.6 oz (83.3 kg)    Repeat BP: 152/64  GEN: Well nourished, well developed in no acute distress HEENT: Normal NECK: No JVD; No carotid bruits CARDIAC: S1/S2, irregular rhythm and regular rate, no murmurs, rubs, gallops; 2+ pulses throughout RESPIRATORY:  Clear to auscultation without rales, wheezing or rhonchi  MUSCULOSKELETAL:  No edema; No deformity  SKIN: Warm and dry NEUROLOGIC:  Alert and oriented x 3 PSYCHIATRIC:  Normal affect   ASSESSMENT:    1. Chronic diastolic CHF (congestive heart failure) (HCC)   2. Atrial fibrillation, unspecified type (HCC)   3. Coronary artery disease involving native heart without angina pectoris, unspecified vessel or lesion type   4. Mixed hyperlipidemia   5. Medication management   6. Carotid stenosis, bilateral   7. Pulmonary hypertension, unspecified (HCC)   8.  Tricuspid valve insufficiency, unspecified etiology   9. Essential hypertension, benign   10. Poor appetite     PLAN:    In order of problems listed above:  Chronic diastolic CHF Echo on 02/25/2022 revealed EF 65 to 70%, no RWMA.  Ventricular septum was flattened in systole and diastole suggesting RV pressure and volume overload, most likely d/t pulmonary hypertension (see below).  RV was not well-visualized, it grossly appeared enlarged with decreased systolic function.  Severely elevated PASP was noted. Euvolemic and well compensated on exam. Continue Lasix and potassium. Low sodium diet, fluid restriction <2L, and daily weights encouraged. Educated to contact our office for weight gain of 2 lbs overnight or 5 lbs in one week. Heart healthy diet and regular cardiovascular exercise encouraged.   A-fib Rate controlled on exam today.  Denies any tachycardia or palpitations.  Was unable to tolerate carvedilol due to severe bradycardia.  Dr. Dina Rich who saw her in the hospital stated would not add back AV nodal agent at this time.  Not a anticoagulation candidate due to past history of GI bleed. Heart healthy diet and regular cardiovascular exercise encouraged. Continue ASA.  CAD, status post CABG x 3 and NSTEMI, HLD, medication management Stable with no anginal symptoms. No indication for  ischemic evaluation. LDL 96 from 05/2022. Not at goal. Continue atorvastatin, will start Zetia 10 mg daily. Will recheck FLP and LFT in 2 months per protocol.  Continue rest of medication regimen. Heart healthy diet and regular cardiovascular exercise encouraged.   Carotid disease Carotid Doppler performed on February 25, 2022 revealed moderate shadowing calcified plaque noted at the left carotid bifurcation with elevated peak systolic velocity suggesting greater than 70% stenosis, right ICA demonstrated less than 50% stenosis.  Denies any symptoms.  Denies any recurrent syncope.  Repeat carotid Doppler  arranged. Continue atorvastatin and starting Zetia - see above. Heart healthy diet and regular cardiovascular exercise encouraged.   5. Pulmonary HTN, tricuspid valve regurgitation Echo 02/2022 revealed indeterminant diastolic function, RV pressure volume overload, decreased RV systolic function, severe pulmonary hypertension, moderate to severe TR, dilated IVC.  Most likely secondary due to chronic O2 dependent lung disease with left-sided heart disease.  Dr. Dina Rich stated that the possibility vasodilators having a role will be quite small and suggested not to pursue further testing at this time.  Continue to follow-up with pulmonology.  Plan to repeat echocardiogram due to valvular insufficiency. Will arrange to repeat 02/2023.   HTN BP elevated. Will increase amlodipine to 10 mg daily.  Continue rest of current medication regimen. Heart healthy diet and regular cardiovascular exercise encouraged. Given BP log and salty six. Discussed to monitor BP at home at least 2 hours after medications and sitting for 5-10 minutes.   6. Poor appetite Etiology multifactorial. Encouraged to discuss with PCP. She verbalized understanding. Conservative measures/recommendations discussed.   7. Disposition: Follow up with Dr. Dina Rich or [facility] in 6 months or sooner if anything changes.      Medication Adjustments/Labs and Tests Ordered: Current medicines are reviewed at length with the patient today.  Concerns regarding medicines are outlined above.  Orders Placed This Encounter  Procedures   Lipid panel   Hepatic function panel   ECHOCARDIOGRAM COMPLETE   Meds ordered this encounter  Medications   amLODipine (NORVASC) 10 MG tablet    Sig: Take 1 tablet (10 mg total) by mouth daily.    Dispense:  90 tablet    Refill:  1    Dose increased 08/09/2022   ezetimibe (ZETIA) 10 MG tablet    Sig: Take 1 tablet (10 mg total) by mouth daily.    Dispense:  30 tablet    Refill:  6    New  08/09/2022    Patient Instructions  Medication Instructions:   Increase Amlodipine to 10mg  daily  Begin Zetia 10mg  daily  Continue all other medications.     Labwork:  FLP. LFT - orders given  Please do in 8 weeks  Reminder:  Nothing to eat or drink after 12 midnight prior to labs. Office will contact with results via phone, letter or mychart.     Testing/Procedures:  Your physician has requested that you have an echocardiogram. Echocardiography is a painless test that uses sound waves to create images of your heart. It provides your doctor with information about the size and shape of your heart and how well your heart's chambers and valves are working. This procedure takes approximately one hour. There are no restrictions for this procedure. Please do NOT wear cologne, perfume, aftershave, or lotions (deodorant is allowed). Please arrive 15 minutes prior to your appointment time. DUE 02/2023  Follow-Up:  6 months   Any Other Special Instructions Will Be Listed Below (If Applicable).  BP log  Salty six  If you need a refill on your cardiac medications before your next appointment, please call your pharmacy.     SignedSharlene Dory, NP  08/11/2022 11:15 PM    [facility]

## 2022-08-09 NOTE — Patient Instructions (Addendum)
Medication Instructions:   Increase Amlodipine to 10mg  daily  Begin Zetia 10mg  daily  Continue all other medications.     Labwork:  FLP. LFT - orders given  Please do in 8 weeks  Reminder:  Nothing to eat or drink after 12 midnight prior to labs. Office will contact with results via phone, letter or mychart.     Testing/Procedures:  Your physician has requested that you have an echocardiogram. Echocardiography is a painless test that uses sound waves to create images of your heart. It provides your doctor with information about the size and shape of your heart and how well your heart's chambers and valves are working. This procedure takes approximately one hour. There are no restrictions for this procedure. Please do NOT wear cologne, perfume, aftershave, or lotions (deodorant is allowed). Please arrive 15 minutes prior to your appointment time. DUE 02/2023  Follow-Up:  6 months   Any Other Special Instructions Will Be Listed Below (If Applicable).  BP log  Salty six  If you need a refill on your cardiac medications before your next appointment, please call your pharmacy.

## 2022-08-20 ENCOUNTER — Other Ambulatory Visit (HOSPITAL_COMMUNITY)
Admission: RE | Admit: 2022-08-20 | Discharge: 2022-08-20 | Disposition: A | Discharge status: Home / Self Care | Payer: PPO | Source: Ambulatory Visit | Attending: Cardiology | Admitting: Cardiology

## 2022-08-20 DIAGNOSIS — Z79899 Other long term (current) drug therapy: Secondary | ICD-10-CM | POA: Diagnosis present

## 2022-08-20 DIAGNOSIS — E782 Mixed hyperlipidemia: Secondary | ICD-10-CM | POA: Insufficient documentation

## 2022-08-20 LAB — HEPATIC FUNCTION PANEL
ALT: 17 U/L (ref 0–44)
AST: 18 U/L (ref 15–41)
Albumin: 3.7 g/dL (ref 3.5–5.0)
Alkaline Phosphatase: 64 U/L (ref 38–126)
Bilirubin, Direct: <0.1 mg/dL (ref 0.0–0.2)
Total Bilirubin: 0.6 mg/dL (ref 0.3–1.2)
Total Protein: 7.8 g/dL (ref 6.5–8.1)

## 2022-08-20 LAB — LIPID PANEL
Cholesterol: 141 mg/dL (ref 0–200)
HDL: 45 mg/dL (ref >40)
LDL Cholesterol: 80 mg/dL (ref 0–99)
Total CHOL/HDL Ratio: 3.1 RATIO
Triglycerides: 78 mg/dL (ref <150)
VLDL: 16 mg/dL (ref 0–40)

## 2022-08-26 ENCOUNTER — Telehealth: Payer: Self-pay | Admitting: Cardiology

## 2022-08-26 NOTE — Telephone Encounter (Signed)
Please advise if patient is to be on 1 or 2 tabs of her Lasix daily - her dictation just states to continue Lasix.    Looks like her Folic Acid has been managed by hematology / oncology.

## 2022-08-26 NOTE — Telephone Encounter (Signed)
Pt c/o medication issue:  1. Name of Medication: folic acid (FOLVITE) 1 MG tablet and furosemide (LASIX) 20 MG tablet    2. How are you currently taking this medication (dosage and times per day)? no   3. Are you having a reaction (difficulty breathing--STAT)? no  4. What is your medication issue? Patient wants to know if she should still be taking the Folic Acid. She also wants to know how she is supposed to be taking the lasix. She states that her directions state 1x daily whereas on her medication list it says to take 2x daily.

## 2022-08-27 NOTE — Telephone Encounter (Signed)
Left message to return call 

## 2022-08-30 ENCOUNTER — Other Ambulatory Visit: Payer: Self-pay | Admitting: Pulmonary Disease

## 2022-09-02 ENCOUNTER — Telehealth: Payer: Self-pay | Admitting: *Deleted

## 2022-09-02 MED ORDER — ROSUVASTATIN CALCIUM 40 MG PO TABS: 40.0000 mg | ORAL_TABLET | Freq: Every day | ORAL | 6 refills | Status: DC

## 2022-09-02 NOTE — Telephone Encounter (Signed)
-----   Message from Antoine Poche, MD sent at 08/26/2022  9:33 AM EDT ----- Cholesterol still a little high, can she stop atorvastatin and start crestor 40mg  daily.Continue zetia   J BrancH MD

## 2022-09-02 NOTE — Telephone Encounter (Signed)
Lesle Chris, LPN 1/61/0960  4:54 PM EDT Back to Top    Notified, copy to pcp.  New medication sent to [facility].

## 2022-09-05 ENCOUNTER — Other Ambulatory Visit: Payer: Self-pay | Admitting: *Deleted

## 2022-09-05 MED ORDER — FUROSEMIDE 40 MG PO TABS: 40.0000 mg | ORAL_TABLET | Freq: Every day | ORAL | 1 refills | Status: DC

## 2022-09-05 NOTE — Telephone Encounter (Signed)
Patient notified and verbalized understanding.  New 40mg  tablet sent to pharmacy on her Furosemide now. Informed her to check with hem/onc on her Folic Acid as we did not prescribe that.

## 2022-12-05 ENCOUNTER — Inpatient Hospital Stay: Payer: PPO | Attending: Hematology

## 2022-12-05 ENCOUNTER — Ambulatory Visit (HOSPITAL_COMMUNITY)
Admission: RE | Admit: 2022-12-05 | Discharge: 2022-12-05 | Disposition: A | Discharge status: Home / Self Care | Payer: PPO | Source: Ambulatory Visit | Attending: Hematology | Admitting: Hematology

## 2022-12-05 DIAGNOSIS — E538 Deficiency of other specified B group vitamins: Secondary | ICD-10-CM | POA: Insufficient documentation

## 2022-12-05 DIAGNOSIS — C3491 Malignant neoplasm of unspecified part of right bronchus or lung: Secondary | ICD-10-CM | POA: Diagnosis present

## 2022-12-05 DIAGNOSIS — Z86711 Personal history of pulmonary embolism: Secondary | ICD-10-CM | POA: Insufficient documentation

## 2022-12-05 DIAGNOSIS — Z85118 Personal history of other malignant neoplasm of bronchus and lung: Secondary | ICD-10-CM | POA: Insufficient documentation

## 2022-12-05 DIAGNOSIS — D649 Anemia, unspecified: Secondary | ICD-10-CM | POA: Insufficient documentation

## 2022-12-05 DIAGNOSIS — Z7982 Long term (current) use of aspirin: Secondary | ICD-10-CM | POA: Insufficient documentation

## 2022-12-05 DIAGNOSIS — Z87891 Personal history of nicotine dependence: Secondary | ICD-10-CM | POA: Insufficient documentation

## 2022-12-05 DIAGNOSIS — Z79899 Other long term (current) drug therapy: Secondary | ICD-10-CM | POA: Insufficient documentation

## 2022-12-05 LAB — CBC WITH DIFFERENTIAL/PLATELET
Abs Immature Granulocytes: 0.03 10*3/uL (ref 0.00–0.07)
Basophils Absolute: 0.1 10*3/uL (ref 0.0–0.1)
Basophils Relative: 1 %
Eosinophils Absolute: 0.1 10*3/uL (ref 0.0–0.5)
Eosinophils Relative: 1 %
HCT: 36.1 % (ref 36.0–46.0)
Hemoglobin: 11.4 g/dL — ABNORMAL LOW (ref 12.0–15.0)
Immature Granulocytes: 0 %
Lymphocytes Relative: 30 %
Lymphs Abs: 2.2 10*3/uL (ref 0.7–4.0)
MCH: 30.7 pg (ref 26.0–34.0)
MCHC: 31.6 g/dL (ref 30.0–36.0)
MCV: 97.3 fL (ref 80.0–100.0)
Monocytes Absolute: 0.5 10*3/uL (ref 0.1–1.0)
Monocytes Relative: 7 %
Neutro Abs: 4.6 10*3/uL (ref 1.7–7.7)
Neutrophils Relative %: 61 %
Platelets: 230 10*3/uL (ref 150–400)
RBC: 3.71 MIL/uL — ABNORMAL LOW (ref 3.87–5.11)
RDW: 16 % — ABNORMAL HIGH (ref 11.5–15.5)
WBC: 7.5 10*3/uL (ref 4.0–10.5)
nRBC: 0 % (ref 0.0–0.2)

## 2022-12-05 LAB — IRON AND TIBC
Iron: 79 ug/dL (ref 28–170)
Saturation Ratios: 25 % (ref 10.4–31.8)
TIBC: 322 ug/dL (ref 250–450)
UIBC: 243 ug/dL

## 2022-12-05 LAB — VITAMIN B12: Vitamin B-12: 244 pg/mL (ref 180–914)

## 2022-12-05 LAB — COMPREHENSIVE METABOLIC PANEL
ALT: 16 U/L (ref 0–44)
AST: 19 U/L (ref 15–41)
Albumin: 3.6 g/dL (ref 3.5–5.0)
Alkaline Phosphatase: 52 U/L (ref 38–126)
Anion gap: 14 (ref 5–15)
BUN: 27 mg/dL — ABNORMAL HIGH (ref 8–23)
CO2: 30 mmol/L (ref 22–32)
Calcium: 9.5 mg/dL (ref 8.9–10.3)
Chloride: 94 mmol/L — ABNORMAL LOW (ref 98–111)
Creatinine, Ser: 1.38 mg/dL — ABNORMAL HIGH (ref 0.44–1.00)
GFR, Estimated: 39 mL/min — ABNORMAL LOW (ref >60)
Glucose, Bld: 156 mg/dL — ABNORMAL HIGH (ref 70–99)
Potassium: 3.7 mmol/L (ref 3.5–5.1)
Sodium: 138 mmol/L (ref 135–145)
Total Bilirubin: 0.9 mg/dL (ref 0.3–1.2)
Total Protein: 7.8 g/dL (ref 6.5–8.1)

## 2022-12-05 LAB — FERRITIN: Ferritin: 220 ng/mL (ref 11–307)

## 2022-12-12 ENCOUNTER — Encounter: Payer: Self-pay | Admitting: Oncology

## 2022-12-12 ENCOUNTER — Inpatient Hospital Stay: Payer: PPO | Admitting: Oncology

## 2022-12-12 VITALS — BP 194/61 | HR 64 | Temp 98.2°F | Resp 16

## 2022-12-12 DIAGNOSIS — Z86711 Personal history of pulmonary embolism: Secondary | ICD-10-CM | POA: Diagnosis not present

## 2022-12-12 DIAGNOSIS — E538 Deficiency of other specified B group vitamins: Secondary | ICD-10-CM | POA: Diagnosis present

## 2022-12-12 DIAGNOSIS — Z79899 Other long term (current) drug therapy: Secondary | ICD-10-CM | POA: Diagnosis not present

## 2022-12-12 DIAGNOSIS — C3492 Malignant neoplasm of unspecified part of left bronchus or lung: Secondary | ICD-10-CM | POA: Diagnosis not present

## 2022-12-12 DIAGNOSIS — D649 Anemia, unspecified: Secondary | ICD-10-CM | POA: Diagnosis present

## 2022-12-12 DIAGNOSIS — Z87891 Personal history of nicotine dependence: Secondary | ICD-10-CM | POA: Diagnosis not present

## 2022-12-12 DIAGNOSIS — Z7982 Long term (current) use of aspirin: Secondary | ICD-10-CM | POA: Diagnosis not present

## 2022-12-12 DIAGNOSIS — Z85118 Personal history of other malignant neoplasm of bronchus and lung: Secondary | ICD-10-CM | POA: Diagnosis present

## 2022-12-12 NOTE — Progress Notes (Signed)
[facility] [address] [place] [address]    Clinic Day:  12/12/2022  Referring physician: Toma Deiters, MD  Patient Care Team: Toma Deiters, MD as PCP - General (Internal Medicine) Wyline Mood Dorothe Pea, MD as PCP - Cardiology (Cardiology) Doreatha Massed, MD as Medical Oncologist (Oncology) Mickie Bail, RN as Oncology Nurse Navigator (Oncology)   ASSESSMENT & PLAN:   Assessment: 1.  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 -PET CT scan on 04/26/2019 showed mildly hypermetabolic 1.1 cm right upper lobe pulmonary nodule with SUV 2.4.  No findings of adenopathy or metastatic disease. -Right upper lobectomy on 05/13/2019 shows 1.3 cm invasive adenocarcinoma, negative for visceral pleural invasion, LVI negative, margins negative.  Lymph nodes from stations 7, 11 R, 10 R, 4R, 12R are negative. -CT chest surveillance every 6 months for 2 to 3 years was recommended followed by noncontrast CT annually. -CT chest on 10/27/2019 showed surgical changes in the right upper lobe lobectomy with no findings of recurrence.  2 small indeterminate right lower lobe pulmonary nodules, follow-up recommended. -CT chest on 05/04/2020 with post right upper lobe resection with no evidence of recurrence or metastasis.  Clearing of bilateral pleural effusions. -CT chest 12/05/22 showed no evidence of disease and stable right lower lobe nodule   2.  Normocytic anemia: -Her previous hemoglobin was 7.6 with MCV of 88. -She has mild degree of chronic kidney disease which is likely contributing to her anemia. -Colonoscopy earlier this year was normal.    3.  Weight loss: -She lost about 20-25 pounds since her surgery. -She is slowly gaining back.  Her appetite is improving.  Stable.  Plan: 1.   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 - Recommend follow-up in 6 months with CT scan of the chest without contrast and labs.   2.  Normocytic anemia: - She was recently hospitalized from 02/25/2023 through 03/04/2023 with severe anemia and was thought to be from bleeding.  She received 2 units PRBC. - Recent hemoglobin is 11.4 and stable.  No more episodes of blood in stools.    3.  Vitamin B12 deficiency: - She is not taking B12 supplementation    Cindie Crumbly, MD   9/19/202412:56 PM  CHIEF COMPLAINT:   Diagnosis: stage I right lung adenocarcinoma and IDA    Cancer Staging  Non-small cell cancer of right lung (HCC) Staging form: Lung, AJCC 8th Edition - Clinical stage from 06/29/2019: Stage IA2 (cT1b, cN0, cM0) - Signed by Doreatha Massed, MD on 06/29/2019    Prior Therapy: Right upper lobectomy on 05/13/2019  Current Therapy:  Intermittent IV Iron last on 09/22/2020    HISTORY OF PRESENT ILLNESS:   Oncology History  Non-small cell cancer of right lung (HCC)  06/29/2019 Initial Diagnosis   Non-small cell cancer of right lung (HCC)   06/29/2019 Cancer Staging   Staging form: Lung, AJCC 8th Edition - Clinical stage from 06/29/2019: Stage IA2 (cT1b, cN0, cM0) - Signed by Doreatha Massed, MD on 06/29/2019      INTERVAL HISTORY:   Karen Tate is a 77 y.o. female presenting to clinic today for follow up of stage I right lung adenocarcinoma and IDA.  She is accompanied  by her husband.   Today, she states that she is doing well overall.  Her shortness of breath is at baseline and is using oxygen as needed.  She denies any bleeding per rectum or melena.  Denies any cough, hemoptysis, chest pain.  Denies weight loss and loss of appetite.  She denies headache, abdominal pain, blurry vision.   PAST MEDICAL HISTORY:   Past Medical History: Past Medical History:  Diagnosis Date   Anxiety neurosis     Arthritis    Asthma    CAD (coronary artery disease)    a. 08/2017: abnormal nuc-> sent directly to [facility], NSTEMI, s/p CABG (left internal mammary artery to left anterior descending, sequential saphenous vein graft to ramus intermedius branches 1 and 2).   Carotid artery disease (HCC)    Chronic anemia    Dizziness    GERD (gastroesophageal reflux disease)    Glaucoma    Heart disease    Hiatal hernia    Hyperlipemia    Hypertension    Ischemic cardiomyopathy    Joint pain    Mitral regurgitation    Postoperative atrial fibrillation (HCC) 08/2017   Pulmonary embolus (HCC)    Type 2 diabetes mellitus [facility])     Surgical History: Past Surgical History:  Procedure Laterality Date   BACK SURGERY  2016   COLONOSCOPY WITH PROPOFOL N/A 02/27/2022   Procedure: COLONOSCOPY WITH PROPOFOL;  Surgeon: Corbin Ade, MD;  [facility]: [facility];  Service: Endoscopy;  Laterality: N/A;   CORONARY ARTERY BYPASS GRAFT N/A 09/18/2017   Procedure: CORONARY ARTERY BYPASS GRAFTING (CABG);  Surgeon: Loreli Slot, MD;  Location: [facility] OR;  Service: Open Heart Surgery;  Laterality: N/A;  Saphenous vein harvest. LIMA to LAD Saphenous vein (sequential) ramus 1 & 2   ESOPHAGOGASTRODUODENOSCOPY (EGD) WITH PROPOFOL N/A 02/26/2022   Procedure: ESOPHAGOGASTRODUODENOSCOPY (EGD) WITH PROPOFOL;  Surgeon: Dolores Frame, MD;  Location: [facility];  Service: Gastroenterology;  Laterality: N/A;   EYE SURGERY Bilateral    "surgery for glaucoma"   GIVENS CAPSULE STUDY N/A 02/27/2022   Procedure: GIVENS CAPSULE STUDY;  Surgeon: Corbin Ade, MD;  Location: [facility];  Service: Endoscopy;  Laterality: N/A;  TO BE DEPLOYED AT 1700 TODAY   HERNIA REPAIR     INTERCOSTAL NERVE BLOCK Right 05/13/2019   Procedure: Intercostal Nerve Block;  Surgeon: Loreli Slot, MD;  Location: [facility] OR;  Service: Thoracic;  Laterality: Right;   LEFT HEART CATH AND CORONARY ANGIOGRAPHY N/A 09/15/2017   Procedure:  LEFT HEART CATH AND CORONARY ANGIOGRAPHY;  Surgeon: Runell Gess, MD;  Location: [facility];  Service: Cardiovascular;  Laterality: N/A;   LYMPH NODE DISSECTION Right 05/13/2019   Procedure: Lymph Node Dissection;  Surgeon: Loreli Slot, MD;  Location: [facility] OR;  Service: Thoracic;  Laterality: Right;   POLYPECTOMY  02/27/2022   Procedure: POLYPECTOMY;  Surgeon: Corbin Ade, MD;  Location: [facility];  Service: Endoscopy;;   ROTATOR CUFF REPAIR Left    x2   SHOULDER SURGERY     LEFT   TEE WITHOUT CARDIOVERSION N/A 09/18/2017   Procedure: TRANSESOPHAGEAL ECHOCARDIOGRAM (TEE);  Surgeon: Loreli Slot, MD;  Location: [facility] OR;  Service: Open Heart Surgery;  Laterality: N/A;   TOTAL KNEE ARTHROPLASTY     LEFT   VAGINAL HYSTERECTOMY     partial    Social History: Social History   Socioeconomic History   Marital status: Married    Spouse  name: Adela Lank   Number of children: 5   Years of education: Not on file   Highest education level: Not on file  Occupational History   Not on file  Tobacco Use   Smoking status: Former    Current packs/day: 0.00    Average packs/day: 0.5 packs/day for 35.0 years (17.5 ttl pk-yrs)    Types: Cigarettes    Start date: 03/26/1943    Quit date: 03/25/1978    Years since quitting: 44.7    Passive exposure: Never   Smokeless tobacco: Never   Tobacco comments:    quit more than 30 years ago  Vaping Use   Vaping status: Never Used  Substance and Sexual Activity   Alcohol use: No   Drug use: No   Sexual activity: Not on file  Other Topics Concern   Not on file  Social History Narrative   Not on file   Social Determinants of Health   Financial Resource Strain: Low Risk  (06/29/2019)   Overall Financial Resource Strain (CARDIA)    Difficulty of Paying Living Expenses: Not hard at all  Food Insecurity: No Food Insecurity (02/25/2022)   Hunger Vital Sign    Worried About Running Out of Food in the Last Year: Never true    Ran  Out of Food in the Last Year: Never true  Transportation Needs: No Transportation Needs (02/25/2022)   PRAPARE - Administrator, Civil Service (Medical): No    Lack of Transportation (Non-Medical): No  Physical Activity: Inactive (06/29/2019)   Exercise Vital Sign    Days of Exercise per Week: 0 days    Minutes of Exercise per Session: 0 min  Stress: No Stress Concern Present (06/29/2019)   [employer] of Occupational Health - Occupational Stress Questionnaire    Feeling of Stress : Not at all  Social Connections: Moderately Integrated (06/29/2019)   Social Connection and Isolation Panel [NHANES]    Frequency of Communication with Friends and Family: More than three times a week    Frequency of Social Gatherings with Friends and Family: More than three times a week    Attends Religious Services: More than 4 times per year    Active Member of [employer] or Organizations: No    Attends Banker Meetings: Never    Marital Status: Married  Catering manager Violence: Not At Risk (02/25/2022)   Humiliation, Afraid, Rape, and Kick questionnaire    Fear of Current or Ex-Partner: No    Emotionally Abused: No    Physically Abused: No    Sexually Abused: No    Family History: Family History  Problem Relation Age of Onset   Breast cancer Mother    Diabetes Father    Heart disease Father    Congestive Heart Failure Brother    Anemia Daughter    Seizures Daughter    Migraines Daughter    Diabetes Daughter    Hypertension Daughter    Bladder Cancer Son    Congestive Heart Failure Son    Congestive Heart Failure Son    Colon cancer Neg Hx    Colon polyps Neg Hx     Current Medications:  Current Outpatient Medications:    acetaminophen (TYLENOL) 500 MG tablet, Take 1,000 mg by mouth every 6 (six) hours as needed for moderate pain., Disp: , Rfl:    albuterol (VENTOLIN HFA) 108 (90 Base) MCG/ACT inhaler, Inhale 2 puffs into the lungs every 6 (six) hours as needed for  wheezing  or shortness of breath., Disp: , Rfl:    ALPRAZolam (XANAX) 0.5 MG tablet, Take 0.5 mg by mouth 2 (two) times daily., Disp: , Rfl:    amLODipine (NORVASC) 10 MG tablet, Take 1 tablet (10 mg total) by mouth daily., Disp: 90 tablet, Rfl: 1   aspirin EC 81 MG tablet, Take 1 tablet (81 mg total) by mouth daily. Swallow whole., Disp: 30 tablet, Rfl: 12   benzonatate (TESSALON) 200 MG capsule, Take 1 capsule (200 mg total) by mouth 3 (three) times daily as needed for cough., Disp: 30 capsule, Rfl: 1   fish oil-omega-3 fatty acids 1000 MG capsule, Take 1 g by mouth 3 (three) times daily. , Disp: , Rfl:    fluticasone-salmeterol (ADVAIR) 250-50 MCG/ACT AEPB, INHALE 1 DOSE BY MOUTH TWICE DAILY, Disp: 60 each, Rfl: 11   folic acid (FOLVITE) 1 MG tablet, TAKE 1 TABLET EVERY DAY (Patient taking differently: Take 1 mg by mouth daily.), Disp: 90 tablet, Rfl: 3   furosemide (LASIX) 40 MG tablet, Take 1 tablet (40 mg total) by mouth daily., Disp: 90 tablet, Rfl: 1   magnesium oxide (MAG-OX) 400 MG tablet, Take 1 tablet (400 mg total) by mouth daily., Disp: , Rfl:    meclizine (ANTIVERT) 25 MG tablet, Take 25 mg by mouth 3 (three) times daily as needed for dizziness. , Disp: , Rfl:    omeprazole (PRILOSEC) 40 MG capsule, Take 40 mg by mouth daily., Disp: , Rfl:    potassium chloride SA (KLOR-CON M) 20 MEQ tablet, Take 1 tablet (20 mEq total) by mouth daily., Disp: , Rfl:    rosuvastatin (CRESTOR) 40 MG tablet, Take 1 tablet (40 mg total) by mouth daily., Disp: 30 tablet, Rfl: 6   ezetimibe (ZETIA) 10 MG tablet, Take 1 tablet (10 mg total) by mouth daily., Disp: 30 tablet, Rfl: 6   Allergies: Allergies  Allergen Reactions   Beta Adrenergic Blockers Other (See Comments)    Dropped heart rate to the 40's   Calcium Channel Blockers Other (See Comments)    Dropped HR into the 40's   Naproxen Hives    REVIEW OF SYSTEMS:   Review of Systems  Constitutional:  Negative for chills, fatigue and fever.   HENT:   Negative for lump/mass, mouth sores, nosebleeds, sore throat and trouble swallowing.   Eyes:  Negative for eye problems.  Respiratory:  Positive for shortness of breath. Negative for cough.   Cardiovascular:  Negative for chest pain, leg swelling and palpitations.  Gastrointestinal:  Negative for abdominal pain, constipation, diarrhea, nausea and vomiting.  Genitourinary:  Negative for bladder incontinence, difficulty urinating, dysuria, frequency, hematuria and nocturia.   Musculoskeletal:  Negative for arthralgias, back pain, flank pain, myalgias and neck pain.  Skin:  Negative for itching and rash.  Neurological:  Negative for dizziness, headaches and numbness.  Hematological:  Does not bruise/bleed easily.  Psychiatric/Behavioral:  Negative for depression, sleep disturbance and suicidal ideas. The patient is not nervous/anxious.   All other systems reviewed and are negative.    VITALS:   Today's Vitals   12/12/22 1139 12/12/22 1142  BP:  (!) 194/61  Pulse:  64  Resp:  16  Temp:  98.2 F (36.8 C)  TempSrc:  Oral  SpO2:  98%  PainSc: 0-No pain     Performance status (ECOG): 2  PHYSICAL EXAM:   Physical Exam HENT:     Head: Normocephalic.  Eyes:     Conjunctiva/sclera: Conjunctivae normal.  Cardiovascular:  Rate and Rhythm: Normal rate and regular rhythm.  Pulmonary:     Effort: Pulmonary effort is normal.  Abdominal:     General: There is no distension.     Palpations: Abdomen is soft.     Tenderness: There is no abdominal tenderness.  Musculoskeletal:        General: Normal range of motion.  Skin:    General: Skin is warm and dry.     Findings: No rash.  Neurological:     Mental Status: She is alert and oriented to person, place, and time.      LABS:      Latest Ref Rng & Units 12/05/2022    9:21 AM 05/28/2022   10:15 AM 03/03/2022    5:50 AM  CBC  WBC 4.0 - 10.5 K/uL 7.5  5.4  5.1   Hemoglobin 12.0 - 15.0 g/dL 16.1  09.6  9.3   Hematocrit  36.0 - 46.0 % 36.1  35.1  31.7   Platelets 150 - 400 K/uL 230  217  181       Latest Ref Rng & Units 12/05/2022    9:21 AM 08/20/2022    9:51 AM 05/28/2022   10:15 AM  CMP  Glucose 70 - 99 mg/dL 045   409   BUN 8 - 23 mg/dL 27   23   Creatinine 8.11 - 1.00 mg/dL 9.14   7.82   Sodium 956 - 145 mmol/L 138   140   Potassium 3.5 - 5.1 mmol/L 3.7   4.1   Chloride 98 - 111 mmol/L 94   100   CO2 22 - 32 mmol/L 30   30   Calcium 8.9 - 10.3 mg/dL 9.5   9.6   Total Protein 6.5 - 8.1 g/dL 7.8  7.8  7.7   Total Bilirubin 0.3 - 1.2 mg/dL 0.9  0.6  0.5   Alkaline Phos 38 - 126 U/L 52  64  66   AST 15 - 41 U/L 19  18  20    ALT 0 - 44 U/L 16  17  15     Lab Results  Component Value Date   IRON 79 12/05/2022   TIBC 322 12/05/2022   FERRITIN 220 12/05/2022     STUDIES:   I reviewed the following CT scan report and agree with the findings  CT Chest 12/05/22:  IMPRESSION: 1. Stable postsurgical changes of right upper lobectomy. No evidence of recurrent or metastatic disease. 2. Stable solid pulmonary nodule of the right lower lobe. Recommend attention on follow-up.

## 2023-01-15 ENCOUNTER — Telehealth: Payer: Self-pay | Admitting: Cardiology

## 2023-01-15 MED ORDER — ROSUVASTATIN CALCIUM 40 MG PO TABS: 40.0000 mg | ORAL_TABLET | Freq: Every day | ORAL | Status: DC

## 2023-01-15 NOTE — Telephone Encounter (Signed)
Spoke with pharmacy she verbalized understanding and is getting patients medication straight for her

## 2023-01-15 NOTE — Telephone Encounter (Signed)
  Pt c/o medication issue:  1. Name of Medication: atorvastatin 80 mg  rosuvastatin (CRESTOR) 40 MG tablet   2. How are you currently taking this medication (dosage and times per day)?   3. Are you having a reaction (difficulty breathing--STAT)? No   4. What is your medication issue? Cala Bradford from [employer]. She said, they received a prescription from Dr. Wyline Mood in June for crestor with a note to stop taking atorvastatin. However, pt's pcp kept refilling her atorvastatin and renew it this month. Pt thinks she been taking it both since she ran out both medications and need refills. Cala Bradford would like a clarification which medication the pt should be on. She said, to call her and the patient.

## 2023-01-15 NOTE — Telephone Encounter (Signed)
She be on crestor 40mg  daily and not atorvastatin  Dominga Ferry MD

## 2023-01-28 ENCOUNTER — Encounter: Payer: PPO | Admitting: Vascular Surgery

## 2023-02-03 ENCOUNTER — Telehealth: Payer: Self-pay | Admitting: Cardiology

## 2023-02-03 NOTE — Telephone Encounter (Signed)
Patient was calling to verify if she is supposed to be taking  rosuvastatin (CRESTOR) 40 MG tablet and ezetimibe (ZETIA) 10 MG tablet (Expired) . Please call back

## 2023-02-04 NOTE — Telephone Encounter (Signed)
Patient was calling back for update. Please advise

## 2023-02-04 NOTE — Telephone Encounter (Signed)
Yes should be on both the rosuvastatin and ezetimibe   Dominga Ferry MD

## 2023-02-04 NOTE — Telephone Encounter (Signed)
Patient verbalized understanding  

## 2023-02-10 ENCOUNTER — Ambulatory Visit: Payer: PPO | Attending: Cardiology | Admitting: Cardiology

## 2023-02-10 ENCOUNTER — Encounter: Payer: Self-pay | Admitting: Cardiology

## 2023-02-10 VITALS — BP 130/74 | HR 65 | Ht 65.0 in | Wt 181.6 lb

## 2023-02-10 DIAGNOSIS — J449 Chronic obstructive pulmonary disease, unspecified: Secondary | ICD-10-CM | POA: Diagnosis not present

## 2023-02-10 DIAGNOSIS — I4892 Unspecified atrial flutter: Secondary | ICD-10-CM

## 2023-02-10 DIAGNOSIS — G4733 Obstructive sleep apnea (adult) (pediatric): Secondary | ICD-10-CM

## 2023-02-10 DIAGNOSIS — I5032 Chronic diastolic (congestive) heart failure: Secondary | ICD-10-CM | POA: Diagnosis not present

## 2023-02-10 DIAGNOSIS — Z01812 Encounter for preprocedural laboratory examination: Secondary | ICD-10-CM

## 2023-02-10 DIAGNOSIS — I6523 Occlusion and stenosis of bilateral carotid arteries: Secondary | ICD-10-CM | POA: Diagnosis not present

## 2023-02-10 DIAGNOSIS — Z86711 Personal history of pulmonary embolism: Secondary | ICD-10-CM

## 2023-02-10 NOTE — Progress Notes (Signed)
Clinical Summary Karen Tate is a 77 y.o.female seen today for follow up of the following medical problems.    1. CAD - prior CABG in 08/2017 (left internal mammary artery to left anterior descending, sequential saphenous vein graft to ramus intermedius branches 1 and 2). She presented with an NSTEMI -  10/2018 echo LVEF 60-65%   - no recent chest pain. No SOB/DOE - compliant with meds     2. Carotid stenosis - 6/20219 carotid US: RICA 1-39%, LICA 40-59%  01/2020 RICA 1-39%, LICA 40-59% 01-2021 RICA 1-39%, LICA 40-59% - 05/2021 carotid US RICA 1-39%, LICA 40-59% - 02/2022 carotid US RICA <50%, LICA >70%   3. Hyperlipidemia - compliant with meds - last labs with pcp  07/2022 TC 141 TG 78 HDL 45 LDL 80. She was atorvstatin 80, changed to crestor 40mg  daily. Had also been on zetia 10mg  11/2022 TC 115 TG 98 HDL 43 LDL 53  4. HTN - she is compliant with m   5. Mitral regurgitation - 10/2018 echo moderate MR - 06/2020 echo mod MR, mod TR  - has repeat echo for 02/25/23   6. Chronic diastolic HF - 06/2020 admission with SOB, thought to be volume overloaded and diuresed - 06/2020 echo LVEF 60-65%, grade II dd, mild RV dysfunction, PASP 61 mmHg  02/2022 echo: LVEF 65-70%, no WMAs, indet diastolic, RV pressure and volume overolad, RV not well visualized, severe pulm HTN, mod to severe TR  - no SOB/ODOE, occasioanl LE edema.    7. Pulmonary HTN - 06/2020 echo LVEF 60-65%, grade II dd, mild RV dysfunction, PASP 61 mmHg - 02/2022 RV pressure and volume overload, RV not well visualzied, PASP 85 mmHg.    8. Lung cancer - history of stage Ia nonsmall cell lung CA - prior RUL lobectomy   9. History of PE - Jan 2018 at [place], [place] is in epic.  Extensive bilateral lobar and segmental thromboembolism without heart strain  10. COPD - 04/2019 PFTs moderate obstruction, severe restriction, moderate diffusion defect - followed by Dr Craige Cotta pulmonary  - on home O2 2L  chronically   11.Aflutter - noted on EKG today new diagnosis - rare palpitations.   - history of GI bleed 02/2022, extensive GI workup. GI team reported suspect she most likely has lost blood from her small bowel relating to a vascular lesion such as a Dieulafoy, ASA was stopped at the time.  Would not start anticoag at this time  SH: husband admitted to [facility], admitted with stroke. Sounds like hemorrhagic    Past Medical History:  Diagnosis Date   Anxiety neurosis    Arthritis    Asthma    CAD (coronary artery disease)    a. 08/2017: abnormal nuc-> sent directly to [facility], NSTEMI, s/p CABG (left internal mammary artery to left anterior descending, sequential saphenous vein graft to ramus intermedius branches 1 and 2).   Carotid artery disease (HCC)    Chronic anemia    Dizziness    GERD (gastroesophageal reflux disease)    Glaucoma    Heart disease    Hiatal hernia    Hyperlipemia    Hypertension    Ischemic cardiomyopathy    Joint pain    Mitral regurgitation    Postoperative atrial fibrillation (HCC) 08/2017   Pulmonary embolus (HCC)    Type 2 diabetes mellitus (HCC)      Allergies  Allergen Reactions   Beta Adrenergic Blockers Other (See Comments)  Dropped heart rate to the 40's   Calcium Channel Blockers Other (See Comments)    Dropped HR into the 40's   Naproxen Hives     Current Outpatient Medications  Medication Sig Dispense Refill   acetaminophen (TYLENOL) 500 MG tablet Take 1,000 mg by mouth every 6 (six) hours as needed for moderate pain.     albuterol (VENTOLIN HFA) 108 (90 Base) MCG/ACT inhaler Inhale 2 puffs into the lungs every 6 (six) hours as needed for wheezing or shortness of breath.     ALPRAZolam (XANAX) 0.5 MG tablet Take 0.5 mg by mouth 2 (two) times daily.     amLODipine (NORVASC) 10 MG tablet Take 1 tablet (10 mg total) by mouth daily. 90 tablet 1   aspirin EC 81 MG tablet Take 1 tablet (81 mg total) by mouth daily. Swallow whole. 30  tablet 12   benzonatate (TESSALON) 200 MG capsule Take 1 capsule (200 mg total) by mouth 3 (three) times daily as needed for cough. 30 capsule 1   ezetimibe (ZETIA) 10 MG tablet Take 1 tablet (10 mg total) by mouth daily. 30 tablet 6   fish oil-omega-3 fatty acids 1000 MG capsule Take 1 g by mouth 3 (three) times daily.      fluticasone-salmeterol (ADVAIR) 250-50 MCG/ACT AEPB INHALE 1 DOSE BY MOUTH TWICE DAILY 60 each 11   folic acid (FOLVITE) 1 MG tablet TAKE 1 TABLET EVERY DAY (Patient taking differently: Take 1 mg by mouth daily.) 90 tablet 3   furosemide (LASIX) 40 MG tablet Take 1 tablet (40 mg total) by mouth daily. 90 tablet 1   magnesium oxide (MAG-OX) 400 MG tablet Take 1 tablet (400 mg total) by mouth daily.     meclizine (ANTIVERT) 25 MG tablet Take 25 mg by mouth 3 (three) times daily as needed for dizziness.      omeprazole (PRILOSEC) 40 MG capsule Take 40 mg by mouth daily.     potassium chloride SA (KLOR-CON M) 20 MEQ tablet Take 1 tablet (20 mEq total) by mouth daily.     rosuvastatin (CRESTOR) 40 MG tablet Take 1 tablet (40 mg total) by mouth daily.     No current facility-administered medications for this visit.     Past Surgical History:  Procedure Laterality Date   BACK SURGERY  2016   COLONOSCOPY WITH PROPOFOL N/A 02/27/2022   Procedure: COLONOSCOPY WITH PROPOFOL;  Surgeon: Corbin Ade, MD;  Location: [facility];  Service: Endoscopy;  Laterality: N/A;   CORONARY ARTERY BYPASS GRAFT N/A 09/18/2017   Procedure: CORONARY ARTERY BYPASS GRAFTING (CABG);  Surgeon: Loreli Slot, MD;  Location: [facility] OR;  Service: Open Heart Surgery;  Laterality: N/A;  Saphenous vein harvest. LIMA to LAD Saphenous vein (sequential) ramus 1 & 2   ESOPHAGOGASTRODUODENOSCOPY (EGD) WITH PROPOFOL N/A 02/26/2022   Procedure: ESOPHAGOGASTRODUODENOSCOPY (EGD) WITH PROPOFOL;  Surgeon: Dolores Frame, MD;  [facility];  Service: Gastroenterology;  Laterality: N/A;   EYE  SURGERY Bilateral    "surgery for glaucoma"   GIVENS CAPSULE STUDY N/A 02/27/2022   Procedure: GIVENS CAPSULE STUDY;  Surgeon: Corbin Ade, MD;  Location: [facility];  Service: Endoscopy;  Laterality: N/A;  TO BE DEPLOYED AT 1700 TODAY   HERNIA REPAIR     INTERCOSTAL NERVE BLOCK Right 05/13/2019   Procedure: Intercostal Nerve Block;  Surgeon: Loreli Slot, MD;  Location: [facility] OR;  Service: Thoracic;  Laterality: Right;   LEFT HEART CATH AND CORONARY  ANGIOGRAPHY N/A 09/15/2017   Procedure: LEFT HEART CATH AND CORONARY ANGIOGRAPHY;  Surgeon: Runell Gess, MD;  Location: [facility];  Service: Cardiovascular;  Laterality: N/A;   LYMPH NODE DISSECTION Right 05/13/2019   Procedure: Lymph Node Dissection;  Surgeon: Loreli Slot, MD;  Location: [facility] OR;  Service: Thoracic;  Laterality: Right;   POLYPECTOMY  02/27/2022   Procedure: POLYPECTOMY;  Surgeon: Corbin Ade, MD;  Location: [facility];  Service: Endoscopy;;   ROTATOR CUFF REPAIR Left    x2   SHOULDER SURGERY     LEFT   TEE WITHOUT CARDIOVERSION N/A 09/18/2017   Procedure: TRANSESOPHAGEAL ECHOCARDIOGRAM (TEE);  Surgeon: Loreli Slot, MD;  Location: [facility] OR;  Service: Open Heart Surgery;  Laterality: N/A;   TOTAL KNEE ARTHROPLASTY     LEFT   VAGINAL HYSTERECTOMY     partial     Allergies  Allergen Reactions   Beta Adrenergic Blockers Other (See Comments)    Dropped heart rate to the 40's   Calcium Channel Blockers Other (See Comments)    Dropped HR into the 40's   Naproxen Hives      Family History  Problem Relation Age of Onset   Breast cancer Mother    Diabetes Father    Heart disease Father    Congestive Heart Failure Brother    Anemia Daughter    Seizures Daughter    Migraines Daughter    Diabetes Daughter    Hypertension Daughter    Bladder Cancer Son    Congestive Heart Failure Son    Congestive Heart Failure Son    Colon cancer Neg Hx    Colon polyps Neg Hx       Social History Ms. Human reports that she quit smoking about 44 years ago. Her smoking use included cigarettes. She has never been exposed to tobacco smoke. She has never used smokeless tobacco. Ms. Poullard reports no history of alcohol use.   Review of Systems CONSTITUTIONAL: No weight loss, fever, chills, weakness or fatigue.  HEENT: Eyes: No visual loss, blurred vision, double vision or yellow sclerae.No hearing loss, sneezing, congestion, runny nose or sore throat.  SKIN: No rash or itching.  CARDIOVASCULAR: per hpi RESPIRATORY: per hpi GASTROINTESTINAL: No anorexia, nausea, vomiting or diarrhea. No abdominal pain or blood.  GENITOURINARY: No burning on urination, no polyuria NEUROLOGICAL: No headache, dizziness, syncope, paralysis, ataxia, numbness or tingling in the extremities. No change in bowel or bladder control.  MUSCULOSKELETAL: No muscle, back pain, joint pain or stiffness.  LYMPHATICS: No enlarged nodes. No history of splenectomy.  PSYCHIATRIC: No history of depression or anxiety.  ENDOCRINOLOGIC: No reports of sweating, cold or heat intolerance. No polyuria or polydipsia.  Marland Kitchen   Physical Examination Today's Vitals   02/10/23 1024  BP: 130/74  Pulse: 65  SpO2: 94%  Weight: 181 lb 9.6 oz (82.4 kg)  Height: 5\' 5"  (1.651 m)   Body mass index is 30.22 kg/m.  Gen: resting comfortably, no acute distress HEENT: no scleral icterus, pupils equal round and reactive, no palptable cervical adenopathy,  CV: RRR, 2/6 systolic murmur rusb, no jvd Resp: Clear to auscultation bilaterally GI: abdomen is soft, non-tender, non-distended, normal bowel sounds, no hepatosplenomegaly MSK: extremities are warm, no edema.  Skin: warm, no rash Neuro:  no focal deficits Psych: appropriate affect   Diagnostic Studies  Echocardiogram 11/18/2018:  1. The left ventricle has normal systolic function with an ejection  fraction of 60-65%. The cavity size  was normal. Left ventricular  diastolic  parameters were normal.   2. The right ventricle has mildly reduced systolic function. The cavity  was normal. There is no increase in right ventricular wall thickness.  Right ventricular systolic pressure is moderately elevated with an  estimated pressure of 49.5 mmHg.   3. The aortic valve is tricuspid. Mild calcification of the aortic valve.  Mild to moderate aortic annular calcification noted.   4. The mitral valve is degenerative. Mild thickening of the mitral valve  leaflet. Mild calcification of the mitral valve leaflet. Mitral valve  regurgitation is moderate by color flow Doppler. The MR jet is eccentric  posteriorly directed.   5. The tricuspid valve is grossly normal.   6. The aorta is normal unless otherwise noted.      01/2020 carotid US Summary:  Right Carotid: Velocities in the right ICA are consistent with a 1-39%  stenosis.                 The ECA appears >50% stenosed.   Left Carotid: Velocities in the left ICA are consistent with a 40-59%  stenosis.   Vertebrals:  Bilateral vertebral arteries demonstrate antegrade flow.  Subclavians: Normal flow hemodynamics were seen in bilateral subclavian               arteries.    06/2020 echo   IMPRESSIONS     1. Left ventricular ejection fraction, by estimation, is 60 to 65%. The  left ventricle has normal function. The left ventricle demonstrates  regional wall motion abnormalities (see scoring diagram/findings for  description). Left ventricular diastolic  parameters are consistent with Grade II diastolic dysfunction  (pseudonormalization).   2. Right ventricular systolic function is mildly reduced. The right  ventricular size is normal. There is severely elevated pulmonary artery  systolic pressure. The estimated right ventricular systolic pressure is  61.3 mmHg.   3. The mitral valve is abnormal. Moderate mitral valve regurgitation.   4. Tricuspid valve regurgitation is moderate.   5. The aortic  valve is tricuspid. There is mild calcification of the  aortic valve. Aortic valve regurgitation is not visualized.   6. The inferior vena cava is normal in size with <50% respiratory  variability, suggesting right atrial pressure of 8 mmHg.   02/2022 echo 1. Left ventricular ejection fraction, by estimation, is 65 to 70%. The  left ventricle has normal function. The left ventricle has no regional  wall motion abnormalities. Left ventricular diastolic parameters are  indeterminate.   2. Ventricular septum is flattnened in systole and diastole suggesting RV  pressure and volume overload. RV not well visualized, grossly appears  enlarged with decreased systolic function. . Right ventricular systolic  function was not well visualized. The   right ventricular size is not well visualized. There is severely elevated  pulmonary artery systolic pressure.   3. Left atrial size was mildly dilated.   4. The mitral valve is abnormal. Mild mitral valve regurgitation. No  evidence of mitral stenosis. Moderate mitral annular calcification.   5. Moderate to severe TR, hepatic systolic flow reversal would suggest  severe TR. . Tricuspid valve regurgitation is moderate. 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   6. The aortic valve is tricuspid. There is moderate calcification of the  aortic valve. There is moderate thickening of the aortic valve. Aortic  valve regurgitation is not visualized. No aortic stenosis is present.   7. The pulmonic valve was abnormal.   8. The inferior vena cava  is dilated in size with <50% respiratory  variability, suggesting right atrial pressure of 15 mmHg.     Assessment and Plan   1. CAD - denies symptoms, continue current meds   2. HTN -at goal, continue current meds   3. Hyperlipidemia - LDL is well controlled, continue currentm eds   5. Carotid stenosis - stable mild to moderate disease - repeat carotid US   6. Pulmonary HTN - noted by recent echo. Most  likely secondary to her chronic O2 dependent lung disease, left sided heart disease. I think the possibility of vasodilators having a role is quite small, would not pursue further testing at this time   7. Chronic diastolic HF - euvolemic without symptoms, continue current meds  8. Mitral regurgitation - repeat echo due for 02/2023  9. Aflutter - new diagnosis in clinic today - she is rate controlled with symptoms - no anticoag due to prior history of GI bleeding       Antoine Poche, MD

## 2023-02-10 NOTE — Patient Instructions (Addendum)
Medication Instructions:   Continue all current medications.   Labwork:  none  Testing/Procedures:  Your physician has requested that you have a carotid duplex. This test is an ultrasound of the carotid arteries in your neck. It looks at blood flow through these arteries that supply the brain with blood. Allow one hour for this exam. There are no restrictions or special instructions. A chest x-ray takes a picture of the organs and structures inside the chest, including the heart, lungs, and blood vessels. This test can show several things, including, whether the heart is enlarges; whether fluid is building up in the lungs; and whether pacemaker / defibrillator leads are still in place.  Ventilation - perfusion (V/Q) scan  Office will contact with results via phone, letter or mychart.     Follow-Up:  6 months   Any Other Special Instructions Will Be Listed Below (If Applicable).  You have been referred to:  Pulmonary   If you need a refill on your cardiac medications before your next appointment, please call your pharmacy.

## 2023-02-11 ENCOUNTER — Encounter: Payer: PPO | Admitting: Vascular Surgery

## 2023-02-18 ENCOUNTER — Ambulatory Visit (HOSPITAL_COMMUNITY): Admission: RE | Admit: 2023-02-18 | Payer: PPO | Source: Ambulatory Visit

## 2023-02-19 ENCOUNTER — Ambulatory Visit: Payer: PPO | Attending: Cardiology

## 2023-02-19 ENCOUNTER — Encounter (HOSPITAL_COMMUNITY): Payer: PPO

## 2023-02-19 DIAGNOSIS — I6523 Occlusion and stenosis of bilateral carotid arteries: Secondary | ICD-10-CM

## 2023-02-25 ENCOUNTER — Ambulatory Visit (HOSPITAL_COMMUNITY)
Admission: RE | Admit: 2023-02-25 | Discharge: 2023-02-25 | Disposition: A | Discharge status: Home / Self Care | Payer: PPO | Source: Ambulatory Visit | Attending: Cardiology | Admitting: Cardiology

## 2023-02-25 DIAGNOSIS — I361 Nonrheumatic tricuspid (valve) insufficiency: Secondary | ICD-10-CM | POA: Diagnosis not present

## 2023-02-25 DIAGNOSIS — I071 Rheumatic tricuspid insufficiency: Secondary | ICD-10-CM | POA: Diagnosis present

## 2023-02-25 LAB — ECHOCARDIOGRAM COMPLETE
AR max vel: 2.25 cm2
AV Area VTI: 2.26 cm2
AV Area mean vel: 2.22 cm2
AV Mean grad: 3 mm[Hg]
AV Peak grad: 6.4 mm[Hg]
Ao pk vel: 1.26 m/s
Area-P 1/2: 2.95 cm2
MV M vel: 5.53 m/s
MV Peak grad: 122.3 mm[Hg]
S' Lateral: 2.3 cm

## 2023-02-25 NOTE — Progress Notes (Signed)
*  PRELIMINARY RESULTS* Echocardiogram 2D Echocardiogram has been performed.  Stacey Drain 02/25/2023, 10:41 AM

## 2023-02-28 ENCOUNTER — Telehealth: Payer: Self-pay | Admitting: *Deleted

## 2023-02-28 NOTE — Telephone Encounter (Signed)
Lesle Chris, LPN [id number]/8467 62:95 AM EST Back to Top    Notified, copy to pcp.

## 2023-02-28 NOTE — Telephone Encounter (Signed)
-----   Message from Dina Rich sent at 02/27/2023  2:29 PM EST ----- Carotid US shows mild to moderate blockages on both sides, we will continue to monitor at this time  Dominga Ferry MD

## 2023-03-05 ENCOUNTER — Other Ambulatory Visit: Payer: Self-pay | Admitting: Cardiology

## 2023-03-11 ENCOUNTER — Telehealth: Payer: Self-pay | Admitting: Cardiology

## 2023-03-11 NOTE — Telephone Encounter (Signed)
Patient wants a call back to discuss upcoming tests and noted she already had a Carotid done on 11/27.

## 2023-03-13 NOTE — Telephone Encounter (Signed)
Multiple attempts made to answer questions with no success. Will close note.

## 2023-03-14 ENCOUNTER — Telehealth: Payer: Self-pay | Admitting: Adult Health

## 2023-03-14 NOTE — Telephone Encounter (Signed)
Called the number given back x 2  Someone answers the phone and does not speak  Will try back

## 2023-03-14 NOTE — Telephone Encounter (Signed)
[facility] in [place]  PT 's daughter calling and needs more neb solution. States her mom only uses one type.   Also, she says mom is SOB and asks that a nurse giver her a CB @ [phone]   I did offer Feb acute appt but daughter dcln. Thank you.

## 2023-03-17 ENCOUNTER — Other Ambulatory Visit: Payer: Self-pay | Admitting: Cardiology

## 2023-03-17 ENCOUNTER — Telehealth: Payer: Self-pay | Admitting: *Deleted

## 2023-03-17 NOTE — Telephone Encounter (Signed)
-----   Message from Dina Rich sent at 03/16/2023  7:58 AM EST ----- Echo shows heart pumping function is normal. No significant new findings  Dominga Ferry MD

## 2023-03-17 NOTE — Telephone Encounter (Signed)
Notified, copy to pcp.

## 2023-03-20 MED ORDER — PREDNISONE 20 MG PO TABS: 20.0000 mg | ORAL_TABLET | Freq: Every day | ORAL | 0 refills | Status: DC

## 2023-03-20 MED ORDER — AZITHROMYCIN 250 MG PO TABS: ORAL_TABLET | ORAL | 0 refills | Status: AC

## 2023-03-20 NOTE — Telephone Encounter (Signed)
Tried calling the pt and there was no answer- LMTCB.  

## 2023-03-20 NOTE — Telephone Encounter (Signed)
Called and spoke to patient.  He feels that she has developed a COPD flare. C/o SOB with exertion and prod cough with brown sputum x1w. Denied f/c/s or additional sx.  She wears 2L cont. She does not monitor spo2.  She uses Advair BID. She has not had to use albuterol HFA. No recent covid test.  She is questioning if a nebulizer and neb solution would be helpful. She last seen Dr.  Craige Cotta 05/2022. Pending appt with Tammy 04/2023.  Tammy, please advise. Thanks

## 2023-03-20 NOTE — Telephone Encounter (Signed)
Daughter called. I adv her of Tammy's inst. She expressed understanding.NFN

## 2023-03-20 NOTE — Telephone Encounter (Signed)
No appointments available  Hx of COPD with asthma and O2 RF (lov -05/2022)   Will send in Zpack #1 and prednisone 20mg  daily for 5 days , take with food.  Rx sent   Mucinex DM As needed  cough/congestion   Cont on Advair  Albuterol As needed    Will discuss new medications at follow up ov   Please contact office for sooner follow up if symptoms do not improve or worsen or seek emergency care

## 2023-03-26 ENCOUNTER — Other Ambulatory Visit: Payer: Self-pay | Admitting: Cardiology

## 2023-03-30 ENCOUNTER — Encounter (HOSPITAL_COMMUNITY): Payer: Self-pay | Admitting: Hematology

## 2023-04-01 ENCOUNTER — Encounter: Payer: PPO | Admitting: Vascular Surgery

## 2023-04-28 NOTE — Progress Notes (Signed)
 Patient name: Karen Tate MRN: [id number] DOB: Apr 01, 1945 Sex: female  REASON FOR CONSULT: PAD with abnormal ABIs  HPI: Karen Tate is a 78 y.o. female, with history of CAD, hypertension, hyperlipidemia, diabetes, A-fib that presents for evaluation of PAD with abnormal ABIs.  She did have ABIs on 01/01/2023 that were 1.0 on the right and 0.78 on the left with evidence of moderate PAD in the left leg.  Today she is in a wheelchair.  States she is able to walk with a walker.  Denies any lower extremity complaints.  No leg pain when walking with her walker.  No rest pain at night.  No open wounds.  Past Medical History:  Diagnosis Date   Anxiety neurosis    Arthritis    Asthma    CAD (coronary artery disease)    a. 08/2017: abnormal nuc-> sent directly to [facility], NSTEMI, s/p CABG (left internal mammary artery to left anterior descending, sequential saphenous vein graft to ramus intermedius branches 1 and 2).   Carotid artery disease (HCC)    Chronic anemia    Dizziness    GERD (gastroesophageal reflux disease)    Glaucoma    Heart disease    Hiatal hernia    Hyperlipemia    Hypertension    Ischemic cardiomyopathy    Joint pain    Mitral regurgitation    Postoperative atrial fibrillation (HCC) 08/2017   Pulmonary embolus (HCC)    Type 2 diabetes mellitus (HCC)     Past Surgical History:  Procedure Laterality Date   BACK SURGERY  2016   COLONOSCOPY WITH PROPOFOL  N/A 02/27/2022   Procedure: COLONOSCOPY WITH PROPOFOL ;  Surgeon: Shaaron Lamar HERO, MD;  Location: [facility];  Service: Endoscopy;  Laterality: N/A;   CORONARY ARTERY BYPASS GRAFT N/A 09/18/2017   Procedure: CORONARY ARTERY BYPASS GRAFTING (CABG);  Surgeon: Kerrin Elspeth BROCKS, MD;  Location: [facility] OR;  Service: Open Heart Surgery;  Laterality: N/A;  Saphenous vein harvest. LIMA to LAD Saphenous vein (sequential) ramus 1 & 2   ESOPHAGOGASTRODUODENOSCOPY (EGD) WITH PROPOFOL  N/A 02/26/2022   Procedure:  ESOPHAGOGASTRODUODENOSCOPY (EGD) WITH PROPOFOL ;  Surgeon: Eartha Angelia Sieving, MD;  Location: [facility];  Service: Gastroenterology;  Laterality: N/A;   EYE SURGERY Bilateral    surgery for glaucoma   GIVENS CAPSULE STUDY N/A 02/27/2022   Procedure: GIVENS CAPSULE STUDY;  Surgeon: Shaaron Lamar HERO, MD;  Location: [facility];  Service: Endoscopy;  Laterality: N/A;  TO BE DEPLOYED AT 1700 TODAY   HERNIA REPAIR     INTERCOSTAL NERVE BLOCK Right 05/13/2019   Procedure: Intercostal Nerve Block;  Surgeon: Kerrin Elspeth BROCKS, MD;  Location: [facility] OR;  Service: Thoracic;  Laterality: Right;   LEFT HEART CATH AND CORONARY ANGIOGRAPHY N/A 09/15/2017   Procedure: LEFT HEART CATH AND CORONARY ANGIOGRAPHY;  Surgeon: Court Dorn JINNY, MD;  Location: [facility];  Service: Cardiovascular;  Laterality: N/A;   LYMPH NODE DISSECTION Right 05/13/2019   Procedure: Lymph Node Dissection;  Surgeon: Kerrin Elspeth BROCKS, MD;  Location: [facility] OR;  Service: Thoracic;  Laterality: Right;   POLYPECTOMY  02/27/2022   Procedure: POLYPECTOMY;  Surgeon: Shaaron Lamar HERO, MD;  Location: [facility];  Service: Endoscopy;;   ROTATOR CUFF REPAIR Left    x2   SHOULDER SURGERY     LEFT   TEE WITHOUT CARDIOVERSION N/A 09/18/2017   Procedure: TRANSESOPHAGEAL ECHOCARDIOGRAM (TEE);  Surgeon: Kerrin Elspeth BROCKS, MD;  Location: [facility] OR;  Service:  Open Heart Surgery;  Laterality: N/A;   TOTAL KNEE ARTHROPLASTY     LEFT   VAGINAL HYSTERECTOMY     partial    Family History  Problem Relation Age of Onset   Breast cancer Mother    Diabetes Father    Heart disease Father    Congestive Heart Failure Brother    Anemia Daughter    Seizures Daughter    Migraines Daughter    Diabetes Daughter    Hypertension Daughter    Bladder Cancer Son    Congestive Heart Failure Son    Congestive Heart Failure Son    Colon cancer Neg Hx    Colon polyps Neg Hx     SOCIAL HISTORY: Social History   Socioeconomic History    Marital status: Married    Spouse name: Emil   Number of children: 5   Years of education: Not on file   Highest education level: Not on file  Occupational History   Not on file  Tobacco Use   Smoking status: Former    Current packs/day: 0.00    Types: Cigarettes    Quit date: 03/25/1978    Years since quitting: 45.1    Passive exposure: Never   Smokeless tobacco: Never   Tobacco comments:    quit more than 30 years ago  Vaping Use   Vaping status: Never Used  Substance and Sexual Activity   Alcohol use: No   Drug use: No   Sexual activity: Not on file  Other Topics Concern   Not on file  Social History Narrative   Not on file   Social Drivers of Health   Financial Resource Strain: Low Risk  (06/29/2019)   Overall Financial Resource Strain (CARDIA)    Difficulty of Paying Living Expenses: Not hard at all  Food Insecurity: No Food Insecurity (02/25/2022)   Hunger Vital Sign    Worried About Running Out of Food in the Last Year: Never true    Ran Out of Food in the Last Year: Never true  Transportation Needs: No Transportation Needs (02/25/2022)   PRAPARE - Administrator, Civil Service (Medical): No    Lack of Transportation (Non-Medical): No  Physical Activity: Inactive (06/29/2019)   Exercise Vital Sign    Days of Exercise per Week: 0 days    Minutes of Exercise per Session: 0 min  Stress: No Stress Concern Present (06/29/2019)   [employer] of Occupational Health - Occupational Stress Questionnaire    Feeling of Stress : Not at all  Social Connections: Moderately Integrated (06/29/2019)   Social Connection and Isolation Panel [NHANES]    Frequency of Communication with Friends and Family: More than three times a week    Frequency of Social Gatherings with Friends and Family: More than three times a week    Attends Religious Services: More than 4 times per year    Active Member of [employer] or Organizations: No    Attends Banker Meetings: Never     Marital Status: Married  Catering Manager Violence: Not At Risk (02/25/2022)   Humiliation, Afraid, Rape, and Kick questionnaire    Fear of Current or Ex-Partner: No    Emotionally Abused: No    Physically Abused: No    Sexually Abused: No    Allergies  Allergen Reactions   Beta Adrenergic Blockers Other (See Comments)    Dropped heart rate to the 40's   Calcium  Channel Blockers Other (See Comments)  Dropped HR into the 40's   Naproxen Hives    Current Outpatient Medications  Medication Sig Dispense Refill   acetaminophen  (TYLENOL ) 500 MG tablet Take 1,000 mg by mouth every 6 (six) hours as needed for moderate pain.     albuterol  (VENTOLIN  HFA) 108 (90 Base) MCG/ACT inhaler Inhale 2 puffs into the lungs every 6 (six) hours as needed for wheezing or shortness of breath.     ALPRAZolam  (XANAX ) 0.5 MG tablet Take 0.5 mg by mouth 2 (two) times daily.     amLODipine  (NORVASC ) 10 MG tablet Take 1 tablet (10 mg total) by mouth daily. 90 tablet 1   aspirin  EC 81 MG tablet Take 1 tablet (81 mg total) by mouth daily. Swallow whole. 30 tablet 12   benzonatate  (TESSALON ) 200 MG capsule Take 1 capsule (200 mg total) by mouth 3 (three) times daily as needed for cough. (Patient not taking: Reported on 02/10/2023) 30 capsule 1   ezetimibe  (ZETIA ) 10 MG tablet Take 1 tablet by mouth once daily 30 tablet 5   fish oil-omega-3 fatty acids  1000 MG capsule Take 1 g by mouth 3 (three) times daily.      fluticasone -salmeterol (ADVAIR) 250-50 MCG/ACT AEPB INHALE 1 DOSE BY MOUTH TWICE DAILY 60 each 11   folic acid  (FOLVITE ) 1 MG tablet TAKE 1 TABLET EVERY DAY (Patient taking differently: Take 1 mg by mouth daily.) 90 tablet 3   furosemide  (LASIX ) 40 MG tablet Take 1 tablet by mouth once daily 90 tablet 1   magnesium  oxide (MAG-OX) 400 MG tablet Take 1 tablet (400 mg total) by mouth daily.     meclizine  (ANTIVERT ) 25 MG tablet Take 25 mg by mouth 3 (three) times daily as needed for dizziness.       omeprazole (PRILOSEC) 40 MG capsule Take 40 mg by mouth daily.     potassium chloride  SA (KLOR-CON  M) 20 MEQ tablet Take 1 tablet (20 mEq total) by mouth daily.     predniSONE  (DELTASONE ) 20 MG tablet Take 1 tablet (20 mg total) by mouth daily with breakfast. 5 tablet 0   rosuvastatin  (CRESTOR ) 40 MG tablet Take 1 tablet by mouth once daily 30 tablet 5   No current facility-administered medications for this visit.    REVIEW OF SYSTEMS:  [X]  denotes positive finding, [ ]  denotes negative finding Cardiac  Comments:  Chest pain or chest pressure:    Shortness of breath upon exertion:    Short of breath when lying flat:    Irregular heart rhythm:        Vascular    Pain in calf, thigh, or hip brought on by ambulation:    Pain in feet at night that wakes you up from your sleep:     Blood clot in your veins:    Leg swelling:         Pulmonary    Oxygen  at home:    Productive cough:     Wheezing:         Neurologic    Sudden weakness in arms or legs:     Sudden numbness in arms or legs:     Sudden onset of difficulty speaking or slurred speech:    Temporary loss of vision in one eye:     Problems with dizziness:         Gastrointestinal    Blood in stool:     Vomited blood:         Genitourinary    Burning  when urinating:     Blood in urine:        Psychiatric    Major depression:         Hematologic    Bleeding problems:    Problems with blood clotting too easily:        Skin    Rashes or ulcers:        Constitutional    Fever or chills:      PHYSICAL EXAM: There were no vitals filed for this visit.  GENERAL: The patient is a well-nourished female, in no acute distress. The vital signs are documented above. CARDIAC: There is a regular rate and rhythm.  VASCULAR:  Bilateral femoral pulses palpable Right DP palpable No palpable left pedal pulses No tissue loss PULMONARY: No respiratory distress. ABDOMEN: Soft and non-tender. MUSCULOSKELETAL: There are no  major deformities or cyanosis. NEUROLOGIC: No focal weakness or paresthesias are detected. SKIN: There are no ulcers or rashes noted. PSYCHIATRIC: The patient has a normal affect.  DATA:   ABIs on 01/01/2023 that were 1.0 on the right and 0.78 on the left with evidence of moderate PAD in the left leg.  Assessment/Plan:  78 y.o. female, with history of CAD, hypertension, hyperlipidemia, diabetes, A-fib that presents for evaluation of PAD with abnormal ABIs.  She did have ABIs on 01/01/2023 that were 1.0 on the right and 0.78 on the left with evidence of moderate PAD in the left leg.  She has asymptomatic PAD as she denies any symptoms of claudication, rest pain or tissue loss.  Discussed for asymptomatic PAD it is safe for observation.  She is on aspirin  statin for risk reduction.  I will have her follow-up in 1 year with repeat ABIs for ongoing surveillance in our office.  Discussed she let me know if she develops wounds that do not heal or pain in her foot at night that keeps her awake.  She does not smoke which is great.   Lonni DOROTHA Gaskins, MD [facility] Office: [phone]

## 2023-04-29 ENCOUNTER — Encounter: Payer: Self-pay | Admitting: Vascular Surgery

## 2023-04-29 ENCOUNTER — Ambulatory Visit (INDEPENDENT_AMBULATORY_CARE_PROVIDER_SITE_OTHER): Payer: Medicare HMO | Admitting: Vascular Surgery

## 2023-04-29 VITALS — BP 175/80 | HR 62 | Temp 98.2°F | Resp 20 | Ht 65.0 in | Wt 181.0 lb

## 2023-04-29 DIAGNOSIS — I739 Peripheral vascular disease, unspecified: Secondary | ICD-10-CM | POA: Insufficient documentation

## 2023-05-13 ENCOUNTER — Ambulatory Visit: Payer: Medicare HMO | Admitting: Adult Health

## 2023-05-13 ENCOUNTER — Encounter (HOSPITAL_COMMUNITY): Payer: Self-pay | Admitting: Hematology

## 2023-05-13 ENCOUNTER — Encounter: Payer: Self-pay | Admitting: Adult Health

## 2023-05-13 VITALS — BP 168/72 | HR 68 | Ht 65.0 in

## 2023-05-13 DIAGNOSIS — C3491 Malignant neoplasm of unspecified part of right bronchus or lung: Secondary | ICD-10-CM | POA: Diagnosis not present

## 2023-05-13 DIAGNOSIS — J4489 Other specified chronic obstructive pulmonary disease: Secondary | ICD-10-CM | POA: Diagnosis not present

## 2023-05-13 DIAGNOSIS — J9611 Chronic respiratory failure with hypoxia: Secondary | ICD-10-CM | POA: Diagnosis not present

## 2023-05-13 MED ORDER — ALBUTEROL SULFATE HFA 108 (90 BASE) MCG/ACT IN AERS: 1.0000 | INHALATION_SPRAY | Freq: Four times a day (QID) | RESPIRATORY_TRACT | 5 refills | Status: AC | PRN

## 2023-05-13 NOTE — Progress Notes (Signed)
 @Patient  ID: Karen Tate, female    DOB: [birth year], 78 y.o.   MRN: [id number]  Chief Complaint  Patient presents with   Sleep Apnea    Former VS patient; does not wear CPAP   COPD    Wears 2LC QHS and PRN w/activity   Discussed the use of AI scribe software for clinical note transcription with the patient, who gave verbal consent to proceed.  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 Medical history again for diastolic heart failure, valvular heart disease, A-fib, history of PE, coronary artery disease  TEST/EVENTS :  PFT 05/12/19 >> FEV1 1.02 (59%), FEV1% 79, TLC 3.07 (60%), FEV1% 63% PFT 01/23/21 >> FEV1 0.98 (56%), FEV1% 82, TLC 2.72 (52%), DLCO 27%   Chest Imaging:  CT angio chest 07/21/20 >> CHF pattern CT chest 12/18/20 >> s/p Rt upper lobectomy, trace Rt effusion, fatty liver, atherosclerosis CT chest 11/08/21 >> resolution of LUL nodule CT chest 05/29/22 >> stable changes after Rt upper lobectomy, atherosclerosis CT chest September 2024 stable postsurgical changes of the right upper lobectomy no evidence of recurrent or metastatic disease, stable right lower lobe pulmonary nodule   Sleep Tests:  ONO with RA 09/08/20 >> test time 6 hrs 2 min.  Basal SpO2 99%, low SpO2 96%.   Cardiac Tests:  Echo 02/25/22 >> EF 65 to 70%, flattened ventricular septum, severe elevation in PASP, mild MR, mod/severe TR, mod/severe TS  05/13/2023 Follow up : COPD with Asthma , O2 RF, history of lung cancer She presents for 1 year follow-up.  She is followed for COPD with asthma and chronic respiratory failure on oxygen 2 L with activity and at bedtime She continues to manage her COPD and asthma with Advair, taking one puff in the  morning and one puff in the evening. She has not needed to use her albuterol inhaler and has misplaced it.  Request that we send a refill in for albuterol . she uses oxygen at home during the day and at bedtime without issues.  Remains on 2 L mobility at home is aided by a walker, while a wheelchair is used when she is away from home. Not active, has motorized wheelchair when away from home.  Regarding her lung cancer, a CT scan in September showed no worrisome lesions.  Has surveillance CT scheduled for March 2025 with oncology follow-up afterwards       Allergies  Allergen Reactions   Beta Adrenergic Blockers Other (See Comments)    Dropped heart rate to the 40's   Calcium Channel Blockers Other (See Comments)    Dropped HR into the 40's   Naproxen Hives    Immunization History  Administered Date(s) Administered   Fluad Quad(high Dose 65+) 02/22/2021   Influenza Split 04/05/2016   Influenza, High Dose Seasonal PF 01/07/2018, 12/15/2018   Influenza,inj,quad, With Preservative 02/04/2017   Influenza-Unspecified 12/23/2017, 01/28/2020   Moderna Sars-Covid-2 Vaccination 04/29/2019, 05/27/2019   Pneumococcal Polysaccharide-23 05/15/2019   Tdap 03/02/1997    Past Medical History:  Diagnosis Date   Anxiety neurosis    Arthritis    Asthma    CAD (coronary artery disease)    a. 08/2017: abnormal nuc-> sent directly to [facility], NSTEMI, s/p CABG (left internal mammary artery to left anterior descending, sequential saphenous  vein graft to ramus intermedius branches 1 and 2).   Carotid artery disease (HCC)    Chronic anemia    Dizziness    GERD (gastroesophageal reflux disease)    Glaucoma    Heart disease    Hiatal hernia    Hyperlipemia    Hypertension    Ischemic cardiomyopathy    Joint pain    Mitral regurgitation    Postoperative atrial fibrillation (HCC) 08/2017   Pulmonary embolus (HCC)    Type 2 diabetes mellitus (HCC)     Tobacco History: Social History   Tobacco Use   Smoking Status Former   Current packs/day: 0.00   Types: Cigarettes   Quit date: 03/25/1978   Years since quitting: 45.1   Passive exposure: Never  Smokeless Tobacco Never  Tobacco Comments   quit more than 30 years ago   Counseling given: Not Answered Tobacco comments: quit more than 30 years ago   Outpatient Medications Prior to Visit  Medication Sig Dispense Refill   acetaminophen (TYLENOL) 500 MG tablet Take 1,000 mg by mouth every 6 (six) hours as needed for moderate pain.     albuterol (ACCUNEB) 0.63 MG/3ML nebulizer solution Take 1 ampule by nebulization every 6 (six) hours as needed.     ALPRAZolam (XANAX) 0.5 MG tablet Take 0.5 mg by mouth 2 (two) times daily.     amLODipine (NORVASC) 10 MG tablet Take 1 tablet (10 mg total) by mouth daily. 90 tablet 1   aspirin EC 81 MG tablet Take 1 tablet (81 mg total) by mouth daily. Swallow whole. 30 tablet 12   ezetimibe (ZETIA) 10 MG tablet Take 1 tablet by mouth once daily 30 tablet 5   fish oil-omega-3 fatty acids 1000 MG capsule Take 1 g by mouth 3 (three) times daily.      fluticasone-salmeterol (ADVAIR) 250-50 MCG/ACT AEPB INHALE 1 DOSE BY MOUTH TWICE DAILY 60 each 11   folic acid (FOLVITE) 1 MG tablet TAKE 1 TABLET EVERY DAY (Patient taking differently: Take 1 mg by mouth daily.) 90 tablet 3   furosemide (LASIX) 40 MG tablet Take 1 tablet by mouth once daily 90 tablet 1   latanoprost (XALATAN) 0.005 % ophthalmic solution 1 drop daily.     magnesium oxide (MAG-OX) 400 MG tablet Take 1 tablet (400 mg total) by mouth daily.     meclizine (ANTIVERT) 25 MG tablet Take 25 mg by mouth 3 (three) times daily as needed for dizziness.      metoprolol succinate (TOPROL-XL) 25 MG 24 hr tablet Take 25 mg by mouth daily.     omeprazole (PRILOSEC) 40 MG capsule Take 40 mg by mouth daily.     potassium chloride SA (KLOR-CON M) 20 MEQ tablet Take 1 tablet (20 mEq total) by mouth daily.     rosuvastatin (CRESTOR) 40 MG tablet Take 1 tablet by mouth  once daily 30 tablet 5   albuterol (VENTOLIN HFA) 108 (90 Base) MCG/ACT inhaler Inhale 2 puffs into the lungs every 6 (six) hours as needed for wheezing or shortness of breath.     benzonatate (TESSALON) 200 MG capsule Take 1 capsule (200 mg total) by mouth 3 (three) times daily as needed for cough. (Patient not taking: Reported on 04/29/2023) 30 capsule 1   predniSONE (DELTASONE) 20 MG tablet Take 1 tablet (20 mg total) by mouth daily with breakfast. 5 tablet 0   No facility-administered medications prior to visit.     Review of Systems:   Constitutional:  No  weight loss, night sweats,  Fevers, chills,+ fatigue, or  lassitude.  HEENT:   No headaches,  Difficulty swallowing,  Tooth/dental problems, or  Sore throat,                No sneezing, itching, ear ache, nasal congestion, post nasal drip,   CV:  No chest pain,  Orthopnea, PND, swelling in lower extremities, anasarca, dizziness, palpitations, syncope.   GI  No heartburn, indigestion, abdominal pain, nausea, vomiting, diarrhea, change in bowel habits, loss of appetite, bloody stools.   Resp:   No chest wall deformity  Skin: no rash or lesions.  GU: no dysuria, change in color of urine, no urgency or frequency.  No flank pain, no hematuria   MS:  No joint pain or swelling.  No decreased range of motion.  No back pain.    Physical Exam  BP (!) 168/72   Pulse 68   Ht 5\' 5"  (1.651 m)   SpO2 95% Comment: ROOM AIR  BMI 30.12 kg/m   GEN: A/Ox3; pleasant , NAD, well nourished, wheelchair   HEENT:  [place]/AT,  EACs-clear, TMs-wnl, NOSE-clear, THROAT-clear, no lesions, no postnasal drip or exudate noted.   NECK:  Supple w/ fair ROM; no JVD; normal carotid impulses w/o bruits; no thyromegaly or nodules palpated; no lymphadenopathy.    RESP  Clear  P & A; w/o, wheezes/ rales/ or rhonchi. no accessory muscle use, no dullness to percussion  CARD:  RRR, no m/r/g, tr  peripheral edema, pulses intact, no cyanosis or clubbing.  GI:    Soft & nt; nml bowel sounds; no organomegaly or masses detected.   Musco: Warm bil, no deformities or joint swelling noted.   Neuro: alert, no focal deficits noted.    Skin: Warm, no lesions or rashes    Lab Results:    BNP ProBNP No results found for: "PROBNP"  Imaging: No results found.  Administration History     None          Latest Ref Rng & Units 01/23/2021   11:17 AM 05/12/2019   10:07 AM 09/16/2017    8:01 AM  PFT Results  FVC-Pre L 1.10  1.29  1.66   FVC-Predicted Pre % 48  58  70   FVC-Post L 1.19  1.18  1.68   FVC-Predicted Post % 53  53  71   Pre FEV1/FVC % % 84  79  81   Post FEV1/FCV % % 82  84  86   FEV1-Pre L 0.92  1.02  1.35   FEV1-Predicted Pre % 53  59  73   FEV1-Post L 0.98  1.00  1.45   DLCO uncorrected ml/min/mmHg 5.34  12.18  14.04   DLCO UNC% % 27  63  54   DLCO corrected ml/min/mmHg 5.62   15.93   DLCO COR %Predicted % 28   62   DLVA Predicted % 57  104  121   TLC L 2.72  3.07  9.83   TLC % Predicted % 52  60  188   RV % Predicted % 66  72  347     No results found for: "NITRICOXIDE"      Assessment & Plan:     Assessment and Plan    Chronic Obstructive Pulmonary Disease (COPD) with asthma COPD is well-managed with Advair, taken as one puff in the morning and evening. There have been no recent exacerbations or hospitalizations, and no issues with oxygen therapy.  Mobility is supported by a walker at home and a wheelchair when away. Rinsing the mouth after using Advair to prevent oral thrush was discussed. Continue Advair as prescribed, refill the albuterol inhaler, and ensure proper use of oxygen therapy as needed.  Chronic respiratory failure continue on oxygen to maintain O2 saturations greater than 88 to 90%.  Current baseline is at 2 L with activity and at bedtime.   Lung Cancer Hx of stage I adenocarcinoma status post resection February 2021 followed by oncology A recent CT scan in September 2024 showed no worrisome  areas. A follow-up CT scan is scheduled for March, along with an oncology follow-up appointment. The importance of attending these follow-ups to monitor for changes was discussed. Complete the follow-up CT scan and attend the oncology appointment in March.   Follow-up Schedule a follow-up appointment in one year with Dr. Sherene Sires in 1 year and As needed           Rubye Oaks, NP 05/13/2023

## 2023-05-13 NOTE — Patient Instructions (Addendum)
 Continue on Advair 1 puff Twice daily, rinse after use.  Albuterol inhaler As needed.  Activity as tolerated  Continue on Oxygen 2l/m with activity and At bedtime   Follow up with Dr. Sherene Sires  in 1 year and As needed

## 2023-05-17 DIAGNOSIS — R9082 White matter disease, unspecified: Secondary | ICD-10-CM | POA: Diagnosis not present

## 2023-05-17 DIAGNOSIS — G935 Compression of brain: Secondary | ICD-10-CM | POA: Diagnosis not present

## 2023-05-17 DIAGNOSIS — R29722 NIHSS score 22: Secondary | ICD-10-CM | POA: Diagnosis not present

## 2023-05-17 DIAGNOSIS — K8020 Calculus of gallbladder without cholecystitis without obstruction: Secondary | ICD-10-CM | POA: Diagnosis not present

## 2023-05-17 DIAGNOSIS — R531 Weakness: Secondary | ICD-10-CM | POA: Diagnosis not present

## 2023-05-17 DIAGNOSIS — G936 Cerebral edema: Secondary | ICD-10-CM | POA: Diagnosis not present

## 2023-05-17 DIAGNOSIS — I6523 Occlusion and stenosis of bilateral carotid arteries: Secondary | ICD-10-CM | POA: Diagnosis not present

## 2023-05-17 DIAGNOSIS — I5043 Acute on chronic combined systolic (congestive) and diastolic (congestive) heart failure: Secondary | ICD-10-CM | POA: Diagnosis not present

## 2023-05-17 DIAGNOSIS — R29810 Facial weakness: Secondary | ICD-10-CM | POA: Diagnosis not present

## 2023-05-17 DIAGNOSIS — Z951 Presence of aortocoronary bypass graft: Secondary | ICD-10-CM | POA: Diagnosis not present

## 2023-05-17 DIAGNOSIS — D649 Anemia, unspecified: Secondary | ICD-10-CM | POA: Diagnosis not present

## 2023-05-17 DIAGNOSIS — F339 Major depressive disorder, recurrent, unspecified: Secondary | ICD-10-CM | POA: Diagnosis not present

## 2023-05-17 DIAGNOSIS — R509 Fever, unspecified: Secondary | ICD-10-CM | POA: Diagnosis not present

## 2023-05-17 DIAGNOSIS — Z4682 Encounter for fitting and adjustment of non-vascular catheter: Secondary | ICD-10-CM | POA: Diagnosis not present

## 2023-05-17 DIAGNOSIS — J90 Pleural effusion, not elsewhere classified: Secondary | ICD-10-CM | POA: Diagnosis not present

## 2023-05-17 DIAGNOSIS — R0902 Hypoxemia: Secondary | ICD-10-CM | POA: Diagnosis not present

## 2023-05-17 DIAGNOSIS — R9431 Abnormal electrocardiogram [ECG] [EKG]: Secondary | ICD-10-CM | POA: Diagnosis not present

## 2023-05-17 DIAGNOSIS — K219 Gastro-esophageal reflux disease without esophagitis: Secondary | ICD-10-CM | POA: Diagnosis not present

## 2023-05-17 DIAGNOSIS — I6502 Occlusion and stenosis of left vertebral artery: Secondary | ICD-10-CM | POA: Diagnosis not present

## 2023-05-17 DIAGNOSIS — I083 Combined rheumatic disorders of mitral, aortic and tricuspid valves: Secondary | ICD-10-CM | POA: Diagnosis not present

## 2023-05-17 DIAGNOSIS — R059 Cough, unspecified: Secondary | ICD-10-CM | POA: Diagnosis not present

## 2023-05-17 DIAGNOSIS — I69322 Dysarthria following cerebral infarction: Secondary | ICD-10-CM | POA: Diagnosis not present

## 2023-05-17 DIAGNOSIS — I672 Cerebral atherosclerosis: Secondary | ICD-10-CM | POA: Diagnosis not present

## 2023-05-17 DIAGNOSIS — E119 Type 2 diabetes mellitus without complications: Secondary | ICD-10-CM | POA: Diagnosis not present

## 2023-05-17 DIAGNOSIS — E441 Mild protein-calorie malnutrition: Secondary | ICD-10-CM | POA: Diagnosis not present

## 2023-05-17 DIAGNOSIS — I69328 Other speech and language deficits following cerebral infarction: Secondary | ICD-10-CM | POA: Diagnosis not present

## 2023-05-17 DIAGNOSIS — R739 Hyperglycemia, unspecified: Secondary | ICD-10-CM | POA: Diagnosis not present

## 2023-05-17 DIAGNOSIS — R918 Other nonspecific abnormal finding of lung field: Secondary | ICD-10-CM | POA: Diagnosis not present

## 2023-05-17 DIAGNOSIS — R0603 Acute respiratory distress: Secondary | ICD-10-CM | POA: Diagnosis not present

## 2023-05-17 DIAGNOSIS — I63311 Cerebral infarction due to thrombosis of right middle cerebral artery: Secondary | ICD-10-CM | POA: Diagnosis not present

## 2023-05-17 DIAGNOSIS — I69315 Cognitive social or emotional deficit following cerebral infarction: Secondary | ICD-10-CM | POA: Diagnosis not present

## 2023-05-17 DIAGNOSIS — I517 Cardiomegaly: Secondary | ICD-10-CM | POA: Diagnosis not present

## 2023-05-17 DIAGNOSIS — I6789 Other cerebrovascular disease: Secondary | ICD-10-CM | POA: Diagnosis not present

## 2023-05-17 DIAGNOSIS — I619 Nontraumatic intracerebral hemorrhage, unspecified: Secondary | ICD-10-CM | POA: Diagnosis not present

## 2023-05-17 DIAGNOSIS — Z7982 Long term (current) use of aspirin: Secondary | ICD-10-CM | POA: Diagnosis not present

## 2023-05-17 DIAGNOSIS — I63511 Cerebral infarction due to unspecified occlusion or stenosis of right middle cerebral artery: Secondary | ICD-10-CM | POA: Diagnosis not present

## 2023-05-17 DIAGNOSIS — I4891 Unspecified atrial fibrillation: Secondary | ICD-10-CM | POA: Diagnosis not present

## 2023-05-17 DIAGNOSIS — G930 Cerebral cysts: Secondary | ICD-10-CM | POA: Diagnosis not present

## 2023-05-17 DIAGNOSIS — J811 Chronic pulmonary edema: Secondary | ICD-10-CM | POA: Diagnosis not present

## 2023-05-17 DIAGNOSIS — R4702 Dysphasia: Secondary | ICD-10-CM | POA: Diagnosis not present

## 2023-05-17 DIAGNOSIS — I480 Paroxysmal atrial fibrillation: Secondary | ICD-10-CM | POA: Diagnosis not present

## 2023-05-17 DIAGNOSIS — Z743 Need for continuous supervision: Secondary | ICD-10-CM | POA: Diagnosis not present

## 2023-05-17 DIAGNOSIS — I69391 Dysphagia following cerebral infarction: Secondary | ICD-10-CM | POA: Diagnosis not present

## 2023-05-17 DIAGNOSIS — Z7409 Other reduced mobility: Secondary | ICD-10-CM | POA: Diagnosis not present

## 2023-05-17 DIAGNOSIS — G9389 Other specified disorders of brain: Secondary | ICD-10-CM | POA: Diagnosis not present

## 2023-05-17 DIAGNOSIS — I10 Essential (primary) hypertension: Secondary | ICD-10-CM | POA: Diagnosis not present

## 2023-05-17 DIAGNOSIS — I63512 Cerebral infarction due to unspecified occlusion or stenosis of left middle cerebral artery: Secondary | ICD-10-CM | POA: Diagnosis not present

## 2023-05-17 DIAGNOSIS — E669 Obesity, unspecified: Secondary | ICD-10-CM | POA: Diagnosis not present

## 2023-05-17 DIAGNOSIS — R4781 Slurred speech: Secondary | ICD-10-CM | POA: Diagnosis not present

## 2023-05-17 DIAGNOSIS — E1165 Type 2 diabetes mellitus with hyperglycemia: Secondary | ICD-10-CM | POA: Diagnosis not present

## 2023-05-17 DIAGNOSIS — I2510 Atherosclerotic heart disease of native coronary artery without angina pectoris: Secondary | ICD-10-CM | POA: Diagnosis not present

## 2023-05-17 DIAGNOSIS — K7200 Acute and subacute hepatic failure without coma: Secondary | ICD-10-CM | POA: Diagnosis not present

## 2023-05-17 DIAGNOSIS — K760 Fatty (change of) liver, not elsewhere classified: Secondary | ICD-10-CM | POA: Diagnosis not present

## 2023-05-17 DIAGNOSIS — I639 Cerebral infarction, unspecified: Secondary | ICD-10-CM | POA: Diagnosis not present

## 2023-05-17 DIAGNOSIS — N390 Urinary tract infection, site not specified: Secondary | ICD-10-CM | POA: Diagnosis not present

## 2023-05-17 DIAGNOSIS — I6601 Occlusion and stenosis of right middle cerebral artery: Secondary | ICD-10-CM | POA: Diagnosis not present

## 2023-05-17 DIAGNOSIS — N179 Acute kidney failure, unspecified: Secondary | ICD-10-CM | POA: Diagnosis not present

## 2023-05-17 DIAGNOSIS — I509 Heart failure, unspecified: Secondary | ICD-10-CM | POA: Diagnosis not present

## 2023-05-17 DIAGNOSIS — G459 Transient cerebral ischemic attack, unspecified: Secondary | ICD-10-CM | POA: Diagnosis not present

## 2023-05-17 DIAGNOSIS — J9601 Acute respiratory failure with hypoxia: Secondary | ICD-10-CM | POA: Diagnosis not present

## 2023-05-17 DIAGNOSIS — R7401 Elevation of levels of liver transaminase levels: Secondary | ICD-10-CM | POA: Diagnosis not present

## 2023-05-17 DIAGNOSIS — I5181 Takotsubo syndrome: Secondary | ICD-10-CM | POA: Diagnosis not present

## 2023-05-17 DIAGNOSIS — G8194 Hemiplegia, unspecified affecting left nondominant side: Secondary | ICD-10-CM | POA: Diagnosis not present

## 2023-05-18 DIAGNOSIS — Z4682 Encounter for fitting and adjustment of non-vascular catheter: Secondary | ICD-10-CM | POA: Diagnosis not present

## 2023-05-20 DIAGNOSIS — Z4682 Encounter for fitting and adjustment of non-vascular catheter: Secondary | ICD-10-CM | POA: Diagnosis not present

## 2023-06-03 ENCOUNTER — Other Ambulatory Visit: Payer: Self-pay

## 2023-06-03 DIAGNOSIS — D649 Anemia, unspecified: Secondary | ICD-10-CM

## 2023-06-03 DIAGNOSIS — C3492 Malignant neoplasm of unspecified part of left bronchus or lung: Secondary | ICD-10-CM

## 2023-06-03 DIAGNOSIS — E538 Deficiency of other specified B group vitamins: Secondary | ICD-10-CM

## 2023-06-04 ENCOUNTER — Inpatient Hospital Stay: Payer: PPO

## 2023-06-04 ENCOUNTER — Ambulatory Visit (HOSPITAL_COMMUNITY): Admission: RE | Admit: 2023-06-04 | Payer: PPO | Source: Ambulatory Visit

## 2023-06-09 DIAGNOSIS — D649 Anemia, unspecified: Secondary | ICD-10-CM | POA: Diagnosis not present

## 2023-06-09 DIAGNOSIS — R1110 Vomiting, unspecified: Secondary | ICD-10-CM | POA: Diagnosis not present

## 2023-06-09 DIAGNOSIS — I69354 Hemiplegia and hemiparesis following cerebral infarction affecting left non-dominant side: Secondary | ICD-10-CM | POA: Diagnosis not present

## 2023-06-09 DIAGNOSIS — I63511 Cerebral infarction due to unspecified occlusion or stenosis of right middle cerebral artery: Secondary | ICD-10-CM | POA: Diagnosis not present

## 2023-06-09 DIAGNOSIS — I4891 Unspecified atrial fibrillation: Secondary | ICD-10-CM | POA: Diagnosis not present

## 2023-06-09 DIAGNOSIS — K449 Diaphragmatic hernia without obstruction or gangrene: Secondary | ICD-10-CM | POA: Diagnosis not present

## 2023-06-09 DIAGNOSIS — I161 Hypertensive emergency: Secondary | ICD-10-CM | POA: Diagnosis not present

## 2023-06-09 DIAGNOSIS — R531 Weakness: Secondary | ICD-10-CM | POA: Diagnosis not present

## 2023-06-09 DIAGNOSIS — F339 Major depressive disorder, recurrent, unspecified: Secondary | ICD-10-CM | POA: Diagnosis not present

## 2023-06-09 DIAGNOSIS — E1165 Type 2 diabetes mellitus with hyperglycemia: Secondary | ICD-10-CM | POA: Diagnosis not present

## 2023-06-09 DIAGNOSIS — I5022 Chronic systolic (congestive) heart failure: Secondary | ICD-10-CM | POA: Diagnosis not present

## 2023-06-09 DIAGNOSIS — I63311 Cerebral infarction due to thrombosis of right middle cerebral artery: Secondary | ICD-10-CM | POA: Diagnosis not present

## 2023-06-09 DIAGNOSIS — I509 Heart failure, unspecified: Secondary | ICD-10-CM | POA: Diagnosis not present

## 2023-06-09 DIAGNOSIS — I5A Non-ischemic myocardial injury (non-traumatic): Secondary | ICD-10-CM | POA: Diagnosis not present

## 2023-06-09 DIAGNOSIS — G9389 Other specified disorders of brain: Secondary | ICD-10-CM | POA: Diagnosis not present

## 2023-06-09 DIAGNOSIS — I69328 Other speech and language deficits following cerebral infarction: Secondary | ICD-10-CM | POA: Diagnosis not present

## 2023-06-09 DIAGNOSIS — R1010 Upper abdominal pain, unspecified: Secondary | ICD-10-CM | POA: Diagnosis not present

## 2023-06-09 DIAGNOSIS — R4182 Altered mental status, unspecified: Secondary | ICD-10-CM | POA: Diagnosis not present

## 2023-06-09 DIAGNOSIS — R509 Fever, unspecified: Secondary | ICD-10-CM | POA: Diagnosis not present

## 2023-06-09 DIAGNOSIS — R000 Tachycardia, unspecified: Secondary | ICD-10-CM | POA: Diagnosis not present

## 2023-06-09 DIAGNOSIS — H4089 Other specified glaucoma: Secondary | ICD-10-CM | POA: Diagnosis not present

## 2023-06-09 DIAGNOSIS — I214 Non-ST elevation (NSTEMI) myocardial infarction: Secondary | ICD-10-CM | POA: Diagnosis not present

## 2023-06-09 DIAGNOSIS — I69315 Cognitive social or emotional deficit following cerebral infarction: Secondary | ICD-10-CM | POA: Diagnosis not present

## 2023-06-09 DIAGNOSIS — N1832 Chronic kidney disease, stage 3b: Secondary | ICD-10-CM | POA: Diagnosis not present

## 2023-06-09 DIAGNOSIS — Z743 Need for continuous supervision: Secondary | ICD-10-CM | POA: Diagnosis not present

## 2023-06-09 DIAGNOSIS — H524 Presbyopia: Secondary | ICD-10-CM | POA: Diagnosis not present

## 2023-06-09 DIAGNOSIS — E119 Type 2 diabetes mellitus without complications: Secondary | ICD-10-CM | POA: Diagnosis not present

## 2023-06-09 DIAGNOSIS — L89153 Pressure ulcer of sacral region, stage 3: Secondary | ICD-10-CM | POA: Diagnosis not present

## 2023-06-09 DIAGNOSIS — F331 Major depressive disorder, recurrent, moderate: Secondary | ICD-10-CM | POA: Diagnosis not present

## 2023-06-09 DIAGNOSIS — J449 Chronic obstructive pulmonary disease, unspecified: Secondary | ICD-10-CM | POA: Diagnosis not present

## 2023-06-09 DIAGNOSIS — L89143 Pressure ulcer of left lower back, stage 3: Secondary | ICD-10-CM | POA: Diagnosis not present

## 2023-06-09 DIAGNOSIS — G40909 Epilepsy, unspecified, not intractable, without status epilepticus: Secondary | ICD-10-CM | POA: Diagnosis not present

## 2023-06-09 DIAGNOSIS — I10 Essential (primary) hypertension: Secondary | ICD-10-CM | POA: Diagnosis not present

## 2023-06-09 DIAGNOSIS — I629 Nontraumatic intracranial hemorrhage, unspecified: Secondary | ICD-10-CM | POA: Diagnosis not present

## 2023-06-09 DIAGNOSIS — K219 Gastro-esophageal reflux disease without esophagitis: Secondary | ICD-10-CM | POA: Diagnosis not present

## 2023-06-09 DIAGNOSIS — R9431 Abnormal electrocardiogram [ECG] [EKG]: Secondary | ICD-10-CM | POA: Diagnosis not present

## 2023-06-09 DIAGNOSIS — G8194 Hemiplegia, unspecified affecting left nondominant side: Secondary | ICD-10-CM | POA: Diagnosis not present

## 2023-06-09 DIAGNOSIS — E1159 Type 2 diabetes mellitus with other circulatory complications: Secondary | ICD-10-CM | POA: Diagnosis not present

## 2023-06-09 DIAGNOSIS — G459 Transient cerebral ischemic attack, unspecified: Secondary | ICD-10-CM | POA: Diagnosis not present

## 2023-06-09 DIAGNOSIS — I69322 Dysarthria following cerebral infarction: Secondary | ICD-10-CM | POA: Diagnosis not present

## 2023-06-09 DIAGNOSIS — R0989 Other specified symptoms and signs involving the circulatory and respiratory systems: Secondary | ICD-10-CM | POA: Diagnosis not present

## 2023-06-09 DIAGNOSIS — Z961 Presence of intraocular lens: Secondary | ICD-10-CM | POA: Diagnosis not present

## 2023-06-09 DIAGNOSIS — I270 Primary pulmonary hypertension: Secondary | ICD-10-CM | POA: Diagnosis not present

## 2023-06-09 DIAGNOSIS — I6523 Occlusion and stenosis of bilateral carotid arteries: Secondary | ICD-10-CM | POA: Diagnosis not present

## 2023-06-09 DIAGNOSIS — F411 Generalized anxiety disorder: Secondary | ICD-10-CM | POA: Diagnosis not present

## 2023-06-09 DIAGNOSIS — L89612 Pressure ulcer of right heel, stage 2: Secondary | ICD-10-CM | POA: Diagnosis not present

## 2023-06-09 DIAGNOSIS — G894 Chronic pain syndrome: Secondary | ICD-10-CM | POA: Diagnosis not present

## 2023-06-09 DIAGNOSIS — Z1152 Encounter for screening for COVID-19: Secondary | ICD-10-CM | POA: Diagnosis not present

## 2023-06-09 DIAGNOSIS — J929 Pleural plaque without asbestos: Secondary | ICD-10-CM | POA: Diagnosis not present

## 2023-06-09 DIAGNOSIS — M549 Dorsalgia, unspecified: Secondary | ICD-10-CM | POA: Diagnosis not present

## 2023-06-09 DIAGNOSIS — I5181 Takotsubo syndrome: Secondary | ICD-10-CM | POA: Diagnosis not present

## 2023-06-09 DIAGNOSIS — I480 Paroxysmal atrial fibrillation: Secondary | ICD-10-CM | POA: Diagnosis not present

## 2023-06-09 DIAGNOSIS — K5900 Constipation, unspecified: Secondary | ICD-10-CM | POA: Diagnosis not present

## 2023-06-09 DIAGNOSIS — B351 Tinea unguium: Secondary | ICD-10-CM | POA: Diagnosis not present

## 2023-06-09 DIAGNOSIS — I5020 Unspecified systolic (congestive) heart failure: Secondary | ICD-10-CM | POA: Diagnosis not present

## 2023-06-09 DIAGNOSIS — E441 Mild protein-calorie malnutrition: Secondary | ICD-10-CM | POA: Diagnosis not present

## 2023-06-09 DIAGNOSIS — I69391 Dysphagia following cerebral infarction: Secondary | ICD-10-CM | POA: Diagnosis not present

## 2023-06-09 DIAGNOSIS — D509 Iron deficiency anemia, unspecified: Secondary | ICD-10-CM | POA: Diagnosis not present

## 2023-06-09 DIAGNOSIS — F5101 Primary insomnia: Secondary | ICD-10-CM | POA: Diagnosis not present

## 2023-06-09 DIAGNOSIS — I611 Nontraumatic intracerebral hemorrhage in hemisphere, cortical: Secondary | ICD-10-CM | POA: Diagnosis not present

## 2023-06-09 DIAGNOSIS — N2889 Other specified disorders of kidney and ureter: Secondary | ICD-10-CM | POA: Diagnosis not present

## 2023-06-10 DIAGNOSIS — K5900 Constipation, unspecified: Secondary | ICD-10-CM | POA: Diagnosis not present

## 2023-06-10 DIAGNOSIS — J449 Chronic obstructive pulmonary disease, unspecified: Secondary | ICD-10-CM | POA: Diagnosis not present

## 2023-06-10 DIAGNOSIS — I5022 Chronic systolic (congestive) heart failure: Secondary | ICD-10-CM | POA: Diagnosis not present

## 2023-06-10 DIAGNOSIS — I63311 Cerebral infarction due to thrombosis of right middle cerebral artery: Secondary | ICD-10-CM | POA: Diagnosis not present

## 2023-06-10 DIAGNOSIS — G8194 Hemiplegia, unspecified affecting left nondominant side: Secondary | ICD-10-CM | POA: Diagnosis not present

## 2023-06-10 DIAGNOSIS — I69322 Dysarthria following cerebral infarction: Secondary | ICD-10-CM | POA: Diagnosis not present

## 2023-06-10 DIAGNOSIS — K219 Gastro-esophageal reflux disease without esophagitis: Secondary | ICD-10-CM | POA: Diagnosis not present

## 2023-06-10 DIAGNOSIS — N1832 Chronic kidney disease, stage 3b: Secondary | ICD-10-CM | POA: Diagnosis not present

## 2023-06-10 DIAGNOSIS — G894 Chronic pain syndrome: Secondary | ICD-10-CM | POA: Diagnosis not present

## 2023-06-11 ENCOUNTER — Inpatient Hospital Stay: Payer: PPO | Admitting: Oncology

## 2023-06-11 DIAGNOSIS — J449 Chronic obstructive pulmonary disease, unspecified: Secondary | ICD-10-CM | POA: Diagnosis not present

## 2023-06-11 DIAGNOSIS — I69391 Dysphagia following cerebral infarction: Secondary | ICD-10-CM | POA: Diagnosis not present

## 2023-06-11 DIAGNOSIS — L89143 Pressure ulcer of left lower back, stage 3: Secondary | ICD-10-CM | POA: Diagnosis not present

## 2023-06-11 DIAGNOSIS — I69315 Cognitive social or emotional deficit following cerebral infarction: Secondary | ICD-10-CM | POA: Diagnosis not present

## 2023-06-11 DIAGNOSIS — I69322 Dysarthria following cerebral infarction: Secondary | ICD-10-CM | POA: Diagnosis not present

## 2023-06-11 DIAGNOSIS — E119 Type 2 diabetes mellitus without complications: Secondary | ICD-10-CM | POA: Diagnosis not present

## 2023-06-11 DIAGNOSIS — I5022 Chronic systolic (congestive) heart failure: Secondary | ICD-10-CM | POA: Diagnosis not present

## 2023-06-11 DIAGNOSIS — N1832 Chronic kidney disease, stage 3b: Secondary | ICD-10-CM | POA: Diagnosis not present

## 2023-06-11 DIAGNOSIS — I63311 Cerebral infarction due to thrombosis of right middle cerebral artery: Secondary | ICD-10-CM | POA: Diagnosis not present

## 2023-06-11 DIAGNOSIS — D509 Iron deficiency anemia, unspecified: Secondary | ICD-10-CM | POA: Diagnosis not present

## 2023-06-11 DIAGNOSIS — L89153 Pressure ulcer of sacral region, stage 3: Secondary | ICD-10-CM | POA: Diagnosis not present

## 2023-06-16 DIAGNOSIS — I5022 Chronic systolic (congestive) heart failure: Secondary | ICD-10-CM | POA: Diagnosis not present

## 2023-06-16 DIAGNOSIS — I10 Essential (primary) hypertension: Secondary | ICD-10-CM | POA: Diagnosis not present

## 2023-06-16 DIAGNOSIS — I270 Primary pulmonary hypertension: Secondary | ICD-10-CM | POA: Diagnosis not present

## 2023-06-16 DIAGNOSIS — I5181 Takotsubo syndrome: Secondary | ICD-10-CM | POA: Diagnosis not present

## 2023-06-16 DIAGNOSIS — I4891 Unspecified atrial fibrillation: Secondary | ICD-10-CM | POA: Diagnosis not present

## 2023-06-16 DIAGNOSIS — E119 Type 2 diabetes mellitus without complications: Secondary | ICD-10-CM | POA: Diagnosis not present

## 2023-06-16 DIAGNOSIS — J449 Chronic obstructive pulmonary disease, unspecified: Secondary | ICD-10-CM | POA: Diagnosis not present

## 2023-06-16 DIAGNOSIS — I63311 Cerebral infarction due to thrombosis of right middle cerebral artery: Secondary | ICD-10-CM | POA: Diagnosis not present

## 2023-06-16 DIAGNOSIS — G8194 Hemiplegia, unspecified affecting left nondominant side: Secondary | ICD-10-CM | POA: Diagnosis not present

## 2023-06-17 DIAGNOSIS — G8194 Hemiplegia, unspecified affecting left nondominant side: Secondary | ICD-10-CM | POA: Diagnosis not present

## 2023-06-17 DIAGNOSIS — G894 Chronic pain syndrome: Secondary | ICD-10-CM | POA: Diagnosis not present

## 2023-06-17 DIAGNOSIS — I63311 Cerebral infarction due to thrombosis of right middle cerebral artery: Secondary | ICD-10-CM | POA: Diagnosis not present

## 2023-06-18 DIAGNOSIS — L89153 Pressure ulcer of sacral region, stage 3: Secondary | ICD-10-CM | POA: Diagnosis not present

## 2023-06-18 DIAGNOSIS — D509 Iron deficiency anemia, unspecified: Secondary | ICD-10-CM | POA: Diagnosis not present

## 2023-06-18 DIAGNOSIS — I5022 Chronic systolic (congestive) heart failure: Secondary | ICD-10-CM | POA: Diagnosis not present

## 2023-06-18 DIAGNOSIS — L89143 Pressure ulcer of left lower back, stage 3: Secondary | ICD-10-CM | POA: Diagnosis not present

## 2023-06-18 DIAGNOSIS — E119 Type 2 diabetes mellitus without complications: Secondary | ICD-10-CM | POA: Diagnosis not present

## 2023-06-24 DIAGNOSIS — I4891 Unspecified atrial fibrillation: Secondary | ICD-10-CM | POA: Diagnosis not present

## 2023-06-24 DIAGNOSIS — E119 Type 2 diabetes mellitus without complications: Secondary | ICD-10-CM | POA: Diagnosis not present

## 2023-06-24 DIAGNOSIS — I10 Essential (primary) hypertension: Secondary | ICD-10-CM | POA: Diagnosis not present

## 2023-06-25 DIAGNOSIS — I5022 Chronic systolic (congestive) heart failure: Secondary | ICD-10-CM | POA: Diagnosis not present

## 2023-06-25 DIAGNOSIS — L89143 Pressure ulcer of left lower back, stage 3: Secondary | ICD-10-CM | POA: Diagnosis not present

## 2023-06-25 DIAGNOSIS — E119 Type 2 diabetes mellitus without complications: Secondary | ICD-10-CM | POA: Diagnosis not present

## 2023-06-25 DIAGNOSIS — L89153 Pressure ulcer of sacral region, stage 3: Secondary | ICD-10-CM | POA: Diagnosis not present

## 2023-06-25 DIAGNOSIS — I4891 Unspecified atrial fibrillation: Secondary | ICD-10-CM | POA: Diagnosis not present

## 2023-06-25 DIAGNOSIS — I63311 Cerebral infarction due to thrombosis of right middle cerebral artery: Secondary | ICD-10-CM | POA: Diagnosis not present

## 2023-06-25 DIAGNOSIS — D649 Anemia, unspecified: Secondary | ICD-10-CM | POA: Diagnosis not present

## 2023-06-25 DIAGNOSIS — D509 Iron deficiency anemia, unspecified: Secondary | ICD-10-CM | POA: Diagnosis not present

## 2023-06-25 DIAGNOSIS — F331 Major depressive disorder, recurrent, moderate: Secondary | ICD-10-CM | POA: Diagnosis not present

## 2023-07-05 DIAGNOSIS — E1159 Type 2 diabetes mellitus with other circulatory complications: Secondary | ICD-10-CM | POA: Diagnosis not present

## 2023-07-05 DIAGNOSIS — B351 Tinea unguium: Secondary | ICD-10-CM | POA: Diagnosis not present

## 2023-07-09 DIAGNOSIS — F331 Major depressive disorder, recurrent, moderate: Secondary | ICD-10-CM | POA: Diagnosis not present

## 2023-07-09 DIAGNOSIS — I5022 Chronic systolic (congestive) heart failure: Secondary | ICD-10-CM | POA: Diagnosis not present

## 2023-07-09 DIAGNOSIS — E119 Type 2 diabetes mellitus without complications: Secondary | ICD-10-CM | POA: Diagnosis not present

## 2023-07-09 DIAGNOSIS — F5101 Primary insomnia: Secondary | ICD-10-CM | POA: Diagnosis not present

## 2023-07-09 DIAGNOSIS — L89153 Pressure ulcer of sacral region, stage 3: Secondary | ICD-10-CM | POA: Diagnosis not present

## 2023-07-09 DIAGNOSIS — L89143 Pressure ulcer of left lower back, stage 3: Secondary | ICD-10-CM | POA: Diagnosis not present

## 2023-07-09 DIAGNOSIS — D509 Iron deficiency anemia, unspecified: Secondary | ICD-10-CM | POA: Diagnosis not present

## 2023-07-16 DIAGNOSIS — F411 Generalized anxiety disorder: Secondary | ICD-10-CM | POA: Diagnosis not present

## 2023-07-16 DIAGNOSIS — L89143 Pressure ulcer of left lower back, stage 3: Secondary | ICD-10-CM | POA: Diagnosis not present

## 2023-07-16 DIAGNOSIS — L89153 Pressure ulcer of sacral region, stage 3: Secondary | ICD-10-CM | POA: Diagnosis not present

## 2023-07-16 DIAGNOSIS — D509 Iron deficiency anemia, unspecified: Secondary | ICD-10-CM | POA: Diagnosis not present

## 2023-07-16 DIAGNOSIS — E119 Type 2 diabetes mellitus without complications: Secondary | ICD-10-CM | POA: Diagnosis not present

## 2023-07-16 DIAGNOSIS — G40909 Epilepsy, unspecified, not intractable, without status epilepticus: Secondary | ICD-10-CM | POA: Diagnosis not present

## 2023-07-16 DIAGNOSIS — I5022 Chronic systolic (congestive) heart failure: Secondary | ICD-10-CM | POA: Diagnosis not present

## 2023-07-17 DIAGNOSIS — H4089 Other specified glaucoma: Secondary | ICD-10-CM | POA: Diagnosis not present

## 2023-07-17 DIAGNOSIS — Z961 Presence of intraocular lens: Secondary | ICD-10-CM | POA: Diagnosis not present

## 2023-07-23 DIAGNOSIS — E119 Type 2 diabetes mellitus without complications: Secondary | ICD-10-CM | POA: Diagnosis not present

## 2023-07-23 DIAGNOSIS — F5101 Primary insomnia: Secondary | ICD-10-CM | POA: Diagnosis not present

## 2023-07-23 DIAGNOSIS — F331 Major depressive disorder, recurrent, moderate: Secondary | ICD-10-CM | POA: Diagnosis not present

## 2023-07-23 DIAGNOSIS — I5022 Chronic systolic (congestive) heart failure: Secondary | ICD-10-CM | POA: Diagnosis not present

## 2023-07-23 DIAGNOSIS — L89612 Pressure ulcer of right heel, stage 2: Secondary | ICD-10-CM | POA: Diagnosis not present

## 2023-07-23 DIAGNOSIS — L89153 Pressure ulcer of sacral region, stage 3: Secondary | ICD-10-CM | POA: Diagnosis not present

## 2023-07-23 DIAGNOSIS — D509 Iron deficiency anemia, unspecified: Secondary | ICD-10-CM | POA: Diagnosis not present

## 2023-07-28 DIAGNOSIS — I611 Nontraumatic intracerebral hemorrhage in hemisphere, cortical: Secondary | ICD-10-CM | POA: Diagnosis not present

## 2023-07-28 DIAGNOSIS — I10 Essential (primary) hypertension: Secondary | ICD-10-CM | POA: Diagnosis not present

## 2023-07-28 DIAGNOSIS — R0989 Other specified symptoms and signs involving the circulatory and respiratory systems: Secondary | ICD-10-CM | POA: Diagnosis not present

## 2023-07-28 DIAGNOSIS — R531 Weakness: Secondary | ICD-10-CM | POA: Diagnosis not present

## 2023-07-28 DIAGNOSIS — R1010 Upper abdominal pain, unspecified: Secondary | ICD-10-CM | POA: Diagnosis not present

## 2023-07-28 DIAGNOSIS — Z1152 Encounter for screening for COVID-19: Secondary | ICD-10-CM | POA: Diagnosis not present

## 2023-07-28 DIAGNOSIS — G9389 Other specified disorders of brain: Secondary | ICD-10-CM | POA: Diagnosis not present

## 2023-07-28 DIAGNOSIS — I161 Hypertensive emergency: Secondary | ICD-10-CM | POA: Diagnosis not present

## 2023-07-28 DIAGNOSIS — R4182 Altered mental status, unspecified: Secondary | ICD-10-CM | POA: Diagnosis not present

## 2023-07-28 DIAGNOSIS — I69354 Hemiplegia and hemiparesis following cerebral infarction affecting left non-dominant side: Secondary | ICD-10-CM | POA: Diagnosis not present

## 2023-07-28 DIAGNOSIS — I5020 Unspecified systolic (congestive) heart failure: Secondary | ICD-10-CM | POA: Diagnosis not present

## 2023-07-28 DIAGNOSIS — I214 Non-ST elevation (NSTEMI) myocardial infarction: Secondary | ICD-10-CM | POA: Diagnosis not present

## 2023-07-28 DIAGNOSIS — R1110 Vomiting, unspecified: Secondary | ICD-10-CM | POA: Diagnosis not present

## 2023-07-28 DIAGNOSIS — J929 Pleural plaque without asbestos: Secondary | ICD-10-CM | POA: Diagnosis not present

## 2023-07-28 DIAGNOSIS — I6523 Occlusion and stenosis of bilateral carotid arteries: Secondary | ICD-10-CM | POA: Diagnosis not present

## 2023-07-28 DIAGNOSIS — K449 Diaphragmatic hernia without obstruction or gangrene: Secondary | ICD-10-CM | POA: Diagnosis not present

## 2023-07-28 DIAGNOSIS — N2889 Other specified disorders of kidney and ureter: Secondary | ICD-10-CM | POA: Diagnosis not present

## 2023-07-28 DIAGNOSIS — I629 Nontraumatic intracranial hemorrhage, unspecified: Secondary | ICD-10-CM | POA: Diagnosis not present

## 2023-07-28 DIAGNOSIS — R000 Tachycardia, unspecified: Secondary | ICD-10-CM | POA: Diagnosis not present

## 2023-07-28 DIAGNOSIS — R9431 Abnormal electrocardiogram [ECG] [EKG]: Secondary | ICD-10-CM | POA: Diagnosis not present

## 2023-07-28 DIAGNOSIS — I5A Non-ischemic myocardial injury (non-traumatic): Secondary | ICD-10-CM | POA: Diagnosis not present

## 2023-07-28 DIAGNOSIS — I63511 Cerebral infarction due to unspecified occlusion or stenosis of right middle cerebral artery: Secondary | ICD-10-CM | POA: Diagnosis not present

## 2023-07-28 DIAGNOSIS — M549 Dorsalgia, unspecified: Secondary | ICD-10-CM | POA: Diagnosis not present

## 2023-07-29 DIAGNOSIS — E441 Mild protein-calorie malnutrition: Secondary | ICD-10-CM | POA: Diagnosis not present

## 2023-07-29 DIAGNOSIS — Z902 Acquired absence of lung [part of]: Secondary | ICD-10-CM | POA: Diagnosis not present

## 2023-07-29 DIAGNOSIS — Z8673 Personal history of transient ischemic attack (TIA), and cerebral infarction without residual deficits: Secondary | ICD-10-CM | POA: Diagnosis not present

## 2023-07-29 DIAGNOSIS — H547 Unspecified visual loss: Secondary | ICD-10-CM | POA: Diagnosis not present

## 2023-07-29 DIAGNOSIS — I110 Hypertensive heart disease with heart failure: Secondary | ICD-10-CM | POA: Diagnosis not present

## 2023-07-29 DIAGNOSIS — Z66 Do not resuscitate: Secondary | ICD-10-CM | POA: Diagnosis not present

## 2023-07-29 DIAGNOSIS — Z1152 Encounter for screening for COVID-19: Secondary | ICD-10-CM | POA: Diagnosis not present

## 2023-07-29 DIAGNOSIS — I639 Cerebral infarction, unspecified: Secondary | ICD-10-CM | POA: Diagnosis not present

## 2023-07-29 DIAGNOSIS — I618 Other nontraumatic intracerebral hemorrhage: Secondary | ICD-10-CM | POA: Diagnosis not present

## 2023-07-29 DIAGNOSIS — I2510 Atherosclerotic heart disease of native coronary artery without angina pectoris: Secondary | ICD-10-CM | POA: Diagnosis not present

## 2023-07-29 DIAGNOSIS — G459 Transient cerebral ischemic attack, unspecified: Secondary | ICD-10-CM | POA: Diagnosis not present

## 2023-07-29 DIAGNOSIS — I10 Essential (primary) hypertension: Secondary | ICD-10-CM | POA: Diagnosis not present

## 2023-07-29 DIAGNOSIS — Z951 Presence of aortocoronary bypass graft: Secondary | ICD-10-CM | POA: Diagnosis not present

## 2023-07-29 DIAGNOSIS — E1165 Type 2 diabetes mellitus with hyperglycemia: Secondary | ICD-10-CM | POA: Diagnosis not present

## 2023-07-29 DIAGNOSIS — R233 Spontaneous ecchymoses: Secondary | ICD-10-CM | POA: Diagnosis not present

## 2023-07-29 DIAGNOSIS — I480 Paroxysmal atrial fibrillation: Secondary | ICD-10-CM | POA: Diagnosis not present

## 2023-07-29 DIAGNOSIS — I629 Nontraumatic intracranial hemorrhage, unspecified: Secondary | ICD-10-CM | POA: Diagnosis not present

## 2023-07-29 DIAGNOSIS — I5022 Chronic systolic (congestive) heart failure: Secondary | ICD-10-CM | POA: Diagnosis not present

## 2023-07-29 DIAGNOSIS — L89159 Pressure ulcer of sacral region, unspecified stage: Secondary | ICD-10-CM | POA: Diagnosis not present

## 2023-07-29 DIAGNOSIS — R059 Cough, unspecified: Secondary | ICD-10-CM | POA: Diagnosis not present

## 2023-07-29 DIAGNOSIS — I69354 Hemiplegia and hemiparesis following cerebral infarction affecting left non-dominant side: Secondary | ICD-10-CM | POA: Diagnosis not present

## 2023-07-29 DIAGNOSIS — I214 Non-ST elevation (NSTEMI) myocardial infarction: Secondary | ICD-10-CM | POA: Diagnosis not present

## 2023-07-29 DIAGNOSIS — R531 Weakness: Secondary | ICD-10-CM | POA: Diagnosis not present

## 2023-07-29 DIAGNOSIS — G8194 Hemiplegia, unspecified affecting left nondominant side: Secondary | ICD-10-CM | POA: Diagnosis not present

## 2023-07-29 DIAGNOSIS — R414 Neurologic neglect syndrome: Secondary | ICD-10-CM | POA: Diagnosis not present

## 2023-07-29 DIAGNOSIS — Z7401 Bed confinement status: Secondary | ICD-10-CM | POA: Diagnosis not present

## 2023-08-13 ENCOUNTER — Encounter (HOSPITAL_COMMUNITY): Payer: Self-pay | Admitting: Hematology

## 2023-08-15 DIAGNOSIS — M6259 Muscle wasting and atrophy, not elsewhere classified, multiple sites: Secondary | ICD-10-CM | POA: Diagnosis not present

## 2023-08-15 DIAGNOSIS — I69315 Cognitive social or emotional deficit following cerebral infarction: Secondary | ICD-10-CM | POA: Diagnosis not present

## 2023-08-15 DIAGNOSIS — I69391 Dysphagia following cerebral infarction: Secondary | ICD-10-CM | POA: Diagnosis not present

## 2023-08-15 DIAGNOSIS — M6281 Muscle weakness (generalized): Secondary | ICD-10-CM | POA: Diagnosis not present

## 2023-08-15 DIAGNOSIS — M24542 Contracture, left hand: Secondary | ICD-10-CM | POA: Diagnosis not present

## 2023-08-18 DIAGNOSIS — I69391 Dysphagia following cerebral infarction: Secondary | ICD-10-CM | POA: Diagnosis not present

## 2023-08-18 DIAGNOSIS — M6281 Muscle weakness (generalized): Secondary | ICD-10-CM | POA: Diagnosis not present

## 2023-08-18 DIAGNOSIS — M24542 Contracture, left hand: Secondary | ICD-10-CM | POA: Diagnosis not present

## 2023-08-18 DIAGNOSIS — S0634AA Traumatic hemorrhage of right cerebrum with loss of consciousness status unknown, initial encounter: Secondary | ICD-10-CM | POA: Diagnosis not present

## 2023-08-18 DIAGNOSIS — I10 Essential (primary) hypertension: Secondary | ICD-10-CM | POA: Diagnosis not present

## 2023-08-18 DIAGNOSIS — M6259 Muscle wasting and atrophy, not elsewhere classified, multiple sites: Secondary | ICD-10-CM | POA: Diagnosis not present

## 2023-08-18 DIAGNOSIS — I69315 Cognitive social or emotional deficit following cerebral infarction: Secondary | ICD-10-CM | POA: Diagnosis not present

## 2023-08-19 DIAGNOSIS — M6281 Muscle weakness (generalized): Secondary | ICD-10-CM | POA: Diagnosis not present

## 2023-08-19 DIAGNOSIS — I69315 Cognitive social or emotional deficit following cerebral infarction: Secondary | ICD-10-CM | POA: Diagnosis not present

## 2023-08-19 DIAGNOSIS — M6259 Muscle wasting and atrophy, not elsewhere classified, multiple sites: Secondary | ICD-10-CM | POA: Diagnosis not present

## 2023-08-19 DIAGNOSIS — M24542 Contracture, left hand: Secondary | ICD-10-CM | POA: Diagnosis not present

## 2023-08-19 DIAGNOSIS — I69391 Dysphagia following cerebral infarction: Secondary | ICD-10-CM | POA: Diagnosis not present

## 2023-08-20 ENCOUNTER — Ambulatory Visit: Payer: PPO | Admitting: Cardiology

## 2023-08-20 DIAGNOSIS — D509 Iron deficiency anemia, unspecified: Secondary | ICD-10-CM | POA: Diagnosis not present

## 2023-08-20 DIAGNOSIS — M24542 Contracture, left hand: Secondary | ICD-10-CM | POA: Diagnosis not present

## 2023-08-20 DIAGNOSIS — S0634AA Traumatic hemorrhage of right cerebrum with loss of consciousness status unknown, initial encounter: Secondary | ICD-10-CM | POA: Diagnosis not present

## 2023-08-20 DIAGNOSIS — E119 Type 2 diabetes mellitus without complications: Secondary | ICD-10-CM | POA: Diagnosis not present

## 2023-08-20 DIAGNOSIS — I69391 Dysphagia following cerebral infarction: Secondary | ICD-10-CM | POA: Diagnosis not present

## 2023-08-20 DIAGNOSIS — L89154 Pressure ulcer of sacral region, stage 4: Secondary | ICD-10-CM | POA: Diagnosis not present

## 2023-08-20 DIAGNOSIS — D649 Anemia, unspecified: Secondary | ICD-10-CM | POA: Diagnosis not present

## 2023-08-20 DIAGNOSIS — I69315 Cognitive social or emotional deficit following cerebral infarction: Secondary | ICD-10-CM | POA: Diagnosis not present

## 2023-08-20 DIAGNOSIS — L89612 Pressure ulcer of right heel, stage 2: Secondary | ICD-10-CM | POA: Diagnosis not present

## 2023-08-20 DIAGNOSIS — I63311 Cerebral infarction due to thrombosis of right middle cerebral artery: Secondary | ICD-10-CM | POA: Diagnosis not present

## 2023-08-20 DIAGNOSIS — F411 Generalized anxiety disorder: Secondary | ICD-10-CM | POA: Diagnosis not present

## 2023-08-20 DIAGNOSIS — F1120 Opioid dependence, uncomplicated: Secondary | ICD-10-CM | POA: Diagnosis not present

## 2023-08-20 DIAGNOSIS — T8130XA Disruption of wound, unspecified, initial encounter: Secondary | ICD-10-CM | POA: Diagnosis not present

## 2023-08-20 DIAGNOSIS — G894 Chronic pain syndrome: Secondary | ICD-10-CM | POA: Diagnosis not present

## 2023-08-20 DIAGNOSIS — G40909 Epilepsy, unspecified, not intractable, without status epilepticus: Secondary | ICD-10-CM | POA: Diagnosis not present

## 2023-08-20 DIAGNOSIS — M6259 Muscle wasting and atrophy, not elsewhere classified, multiple sites: Secondary | ICD-10-CM | POA: Diagnosis not present

## 2023-08-20 DIAGNOSIS — I5022 Chronic systolic (congestive) heart failure: Secondary | ICD-10-CM | POA: Diagnosis not present

## 2023-08-20 DIAGNOSIS — I10 Essential (primary) hypertension: Secondary | ICD-10-CM | POA: Diagnosis not present

## 2023-08-20 DIAGNOSIS — M6281 Muscle weakness (generalized): Secondary | ICD-10-CM | POA: Diagnosis not present

## 2023-08-21 DIAGNOSIS — I69315 Cognitive social or emotional deficit following cerebral infarction: Secondary | ICD-10-CM | POA: Diagnosis not present

## 2023-08-21 DIAGNOSIS — G894 Chronic pain syndrome: Secondary | ICD-10-CM | POA: Diagnosis not present

## 2023-08-21 DIAGNOSIS — I63311 Cerebral infarction due to thrombosis of right middle cerebral artery: Secondary | ICD-10-CM | POA: Diagnosis not present

## 2023-08-21 DIAGNOSIS — F1120 Opioid dependence, uncomplicated: Secondary | ICD-10-CM | POA: Diagnosis not present

## 2023-08-21 DIAGNOSIS — M6259 Muscle wasting and atrophy, not elsewhere classified, multiple sites: Secondary | ICD-10-CM | POA: Diagnosis not present

## 2023-08-21 DIAGNOSIS — M24542 Contracture, left hand: Secondary | ICD-10-CM | POA: Diagnosis not present

## 2023-08-21 DIAGNOSIS — I69391 Dysphagia following cerebral infarction: Secondary | ICD-10-CM | POA: Diagnosis not present

## 2023-08-21 DIAGNOSIS — S0634AA Traumatic hemorrhage of right cerebrum with loss of consciousness status unknown, initial encounter: Secondary | ICD-10-CM | POA: Diagnosis not present

## 2023-08-21 DIAGNOSIS — M6281 Muscle weakness (generalized): Secondary | ICD-10-CM | POA: Diagnosis not present

## 2023-08-22 DIAGNOSIS — M6259 Muscle wasting and atrophy, not elsewhere classified, multiple sites: Secondary | ICD-10-CM | POA: Diagnosis not present

## 2023-08-22 DIAGNOSIS — I69391 Dysphagia following cerebral infarction: Secondary | ICD-10-CM | POA: Diagnosis not present

## 2023-08-22 DIAGNOSIS — I69315 Cognitive social or emotional deficit following cerebral infarction: Secondary | ICD-10-CM | POA: Diagnosis not present

## 2023-08-22 DIAGNOSIS — M6281 Muscle weakness (generalized): Secondary | ICD-10-CM | POA: Diagnosis not present

## 2023-08-22 DIAGNOSIS — M24542 Contracture, left hand: Secondary | ICD-10-CM | POA: Diagnosis not present

## 2023-08-25 DIAGNOSIS — I69315 Cognitive social or emotional deficit following cerebral infarction: Secondary | ICD-10-CM | POA: Diagnosis not present

## 2023-08-25 DIAGNOSIS — M24542 Contracture, left hand: Secondary | ICD-10-CM | POA: Diagnosis not present

## 2023-08-25 DIAGNOSIS — M6281 Muscle weakness (generalized): Secondary | ICD-10-CM | POA: Diagnosis not present

## 2023-08-25 DIAGNOSIS — T8130XA Disruption of wound, unspecified, initial encounter: Secondary | ICD-10-CM | POA: Diagnosis not present

## 2023-08-25 DIAGNOSIS — I69391 Dysphagia following cerebral infarction: Secondary | ICD-10-CM | POA: Diagnosis not present

## 2023-08-25 DIAGNOSIS — M6259 Muscle wasting and atrophy, not elsewhere classified, multiple sites: Secondary | ICD-10-CM | POA: Diagnosis not present

## 2023-08-26 DIAGNOSIS — I69391 Dysphagia following cerebral infarction: Secondary | ICD-10-CM | POA: Diagnosis not present

## 2023-08-26 DIAGNOSIS — N1832 Chronic kidney disease, stage 3b: Secondary | ICD-10-CM | POA: Diagnosis not present

## 2023-08-26 DIAGNOSIS — D509 Iron deficiency anemia, unspecified: Secondary | ICD-10-CM | POA: Diagnosis not present

## 2023-08-26 DIAGNOSIS — M24542 Contracture, left hand: Secondary | ICD-10-CM | POA: Diagnosis not present

## 2023-08-26 DIAGNOSIS — E119 Type 2 diabetes mellitus without complications: Secondary | ICD-10-CM | POA: Diagnosis not present

## 2023-08-26 DIAGNOSIS — I69315 Cognitive social or emotional deficit following cerebral infarction: Secondary | ICD-10-CM | POA: Diagnosis not present

## 2023-08-26 DIAGNOSIS — M6259 Muscle wasting and atrophy, not elsewhere classified, multiple sites: Secondary | ICD-10-CM | POA: Diagnosis not present

## 2023-08-26 DIAGNOSIS — M6281 Muscle weakness (generalized): Secondary | ICD-10-CM | POA: Diagnosis not present

## 2023-08-27 DIAGNOSIS — I69315 Cognitive social or emotional deficit following cerebral infarction: Secondary | ICD-10-CM | POA: Diagnosis not present

## 2023-08-27 DIAGNOSIS — M6259 Muscle wasting and atrophy, not elsewhere classified, multiple sites: Secondary | ICD-10-CM | POA: Diagnosis not present

## 2023-08-27 DIAGNOSIS — D509 Iron deficiency anemia, unspecified: Secondary | ICD-10-CM | POA: Diagnosis not present

## 2023-08-27 DIAGNOSIS — M6281 Muscle weakness (generalized): Secondary | ICD-10-CM | POA: Diagnosis not present

## 2023-08-27 DIAGNOSIS — I69391 Dysphagia following cerebral infarction: Secondary | ICD-10-CM | POA: Diagnosis not present

## 2023-08-27 DIAGNOSIS — L89154 Pressure ulcer of sacral region, stage 4: Secondary | ICD-10-CM | POA: Diagnosis not present

## 2023-08-27 DIAGNOSIS — I5022 Chronic systolic (congestive) heart failure: Secondary | ICD-10-CM | POA: Diagnosis not present

## 2023-08-27 DIAGNOSIS — L89612 Pressure ulcer of right heel, stage 2: Secondary | ICD-10-CM | POA: Diagnosis not present

## 2023-08-27 DIAGNOSIS — E119 Type 2 diabetes mellitus without complications: Secondary | ICD-10-CM | POA: Diagnosis not present

## 2023-08-27 DIAGNOSIS — M24542 Contracture, left hand: Secondary | ICD-10-CM | POA: Diagnosis not present

## 2023-08-28 DIAGNOSIS — I69315 Cognitive social or emotional deficit following cerebral infarction: Secondary | ICD-10-CM | POA: Diagnosis not present

## 2023-08-28 DIAGNOSIS — M6259 Muscle wasting and atrophy, not elsewhere classified, multiple sites: Secondary | ICD-10-CM | POA: Diagnosis not present

## 2023-08-28 DIAGNOSIS — M6281 Muscle weakness (generalized): Secondary | ICD-10-CM | POA: Diagnosis not present

## 2023-08-28 DIAGNOSIS — M24542 Contracture, left hand: Secondary | ICD-10-CM | POA: Diagnosis not present

## 2023-08-28 DIAGNOSIS — I69391 Dysphagia following cerebral infarction: Secondary | ICD-10-CM | POA: Diagnosis not present

## 2023-08-29 DIAGNOSIS — I69315 Cognitive social or emotional deficit following cerebral infarction: Secondary | ICD-10-CM | POA: Diagnosis not present

## 2023-08-29 DIAGNOSIS — M24542 Contracture, left hand: Secondary | ICD-10-CM | POA: Diagnosis not present

## 2023-08-29 DIAGNOSIS — M6259 Muscle wasting and atrophy, not elsewhere classified, multiple sites: Secondary | ICD-10-CM | POA: Diagnosis not present

## 2023-08-29 DIAGNOSIS — I69391 Dysphagia following cerebral infarction: Secondary | ICD-10-CM | POA: Diagnosis not present

## 2023-08-29 DIAGNOSIS — M6281 Muscle weakness (generalized): Secondary | ICD-10-CM | POA: Diagnosis not present

## 2023-09-01 DIAGNOSIS — M6259 Muscle wasting and atrophy, not elsewhere classified, multiple sites: Secondary | ICD-10-CM | POA: Diagnosis not present

## 2023-09-01 DIAGNOSIS — I69315 Cognitive social or emotional deficit following cerebral infarction: Secondary | ICD-10-CM | POA: Diagnosis not present

## 2023-09-01 DIAGNOSIS — I69391 Dysphagia following cerebral infarction: Secondary | ICD-10-CM | POA: Diagnosis not present

## 2023-09-01 DIAGNOSIS — M24542 Contracture, left hand: Secondary | ICD-10-CM | POA: Diagnosis not present

## 2023-09-01 DIAGNOSIS — M6281 Muscle weakness (generalized): Secondary | ICD-10-CM | POA: Diagnosis not present

## 2023-09-02 DIAGNOSIS — M6281 Muscle weakness (generalized): Secondary | ICD-10-CM | POA: Diagnosis not present

## 2023-09-02 DIAGNOSIS — M6259 Muscle wasting and atrophy, not elsewhere classified, multiple sites: Secondary | ICD-10-CM | POA: Diagnosis not present

## 2023-09-02 DIAGNOSIS — M24542 Contracture, left hand: Secondary | ICD-10-CM | POA: Diagnosis not present

## 2023-09-02 DIAGNOSIS — I69315 Cognitive social or emotional deficit following cerebral infarction: Secondary | ICD-10-CM | POA: Diagnosis not present

## 2023-09-02 DIAGNOSIS — I69391 Dysphagia following cerebral infarction: Secondary | ICD-10-CM | POA: Diagnosis not present

## 2023-09-03 DIAGNOSIS — D509 Iron deficiency anemia, unspecified: Secondary | ICD-10-CM | POA: Diagnosis not present

## 2023-09-03 DIAGNOSIS — M24542 Contracture, left hand: Secondary | ICD-10-CM | POA: Diagnosis not present

## 2023-09-03 DIAGNOSIS — I69315 Cognitive social or emotional deficit following cerebral infarction: Secondary | ICD-10-CM | POA: Diagnosis not present

## 2023-09-03 DIAGNOSIS — I69391 Dysphagia following cerebral infarction: Secondary | ICD-10-CM | POA: Diagnosis not present

## 2023-09-03 DIAGNOSIS — I5022 Chronic systolic (congestive) heart failure: Secondary | ICD-10-CM | POA: Diagnosis not present

## 2023-09-03 DIAGNOSIS — F331 Major depressive disorder, recurrent, moderate: Secondary | ICD-10-CM | POA: Diagnosis not present

## 2023-09-03 DIAGNOSIS — E119 Type 2 diabetes mellitus without complications: Secondary | ICD-10-CM | POA: Diagnosis not present

## 2023-09-03 DIAGNOSIS — L89154 Pressure ulcer of sacral region, stage 4: Secondary | ICD-10-CM | POA: Diagnosis not present

## 2023-09-03 DIAGNOSIS — M6281 Muscle weakness (generalized): Secondary | ICD-10-CM | POA: Diagnosis not present

## 2023-09-03 DIAGNOSIS — M6259 Muscle wasting and atrophy, not elsewhere classified, multiple sites: Secondary | ICD-10-CM | POA: Diagnosis not present

## 2023-09-04 DIAGNOSIS — M24542 Contracture, left hand: Secondary | ICD-10-CM | POA: Diagnosis not present

## 2023-09-04 DIAGNOSIS — I69315 Cognitive social or emotional deficit following cerebral infarction: Secondary | ICD-10-CM | POA: Diagnosis not present

## 2023-09-04 DIAGNOSIS — I69391 Dysphagia following cerebral infarction: Secondary | ICD-10-CM | POA: Diagnosis not present

## 2023-09-04 DIAGNOSIS — M6281 Muscle weakness (generalized): Secondary | ICD-10-CM | POA: Diagnosis not present

## 2023-09-04 DIAGNOSIS — M6259 Muscle wasting and atrophy, not elsewhere classified, multiple sites: Secondary | ICD-10-CM | POA: Diagnosis not present

## 2023-09-05 DIAGNOSIS — I69391 Dysphagia following cerebral infarction: Secondary | ICD-10-CM | POA: Diagnosis not present

## 2023-09-05 DIAGNOSIS — M6259 Muscle wasting and atrophy, not elsewhere classified, multiple sites: Secondary | ICD-10-CM | POA: Diagnosis not present

## 2023-09-05 DIAGNOSIS — I69315 Cognitive social or emotional deficit following cerebral infarction: Secondary | ICD-10-CM | POA: Diagnosis not present

## 2023-09-05 DIAGNOSIS — M24542 Contracture, left hand: Secondary | ICD-10-CM | POA: Diagnosis not present

## 2023-09-05 DIAGNOSIS — M6281 Muscle weakness (generalized): Secondary | ICD-10-CM | POA: Diagnosis not present

## 2023-09-06 DIAGNOSIS — M6259 Muscle wasting and atrophy, not elsewhere classified, multiple sites: Secondary | ICD-10-CM | POA: Diagnosis not present

## 2023-09-06 DIAGNOSIS — M24542 Contracture, left hand: Secondary | ICD-10-CM | POA: Diagnosis not present

## 2023-09-06 DIAGNOSIS — I69315 Cognitive social or emotional deficit following cerebral infarction: Secondary | ICD-10-CM | POA: Diagnosis not present

## 2023-09-06 DIAGNOSIS — M6281 Muscle weakness (generalized): Secondary | ICD-10-CM | POA: Diagnosis not present

## 2023-09-06 DIAGNOSIS — I69391 Dysphagia following cerebral infarction: Secondary | ICD-10-CM | POA: Diagnosis not present

## 2023-09-07 DIAGNOSIS — I69391 Dysphagia following cerebral infarction: Secondary | ICD-10-CM | POA: Diagnosis not present

## 2023-09-07 DIAGNOSIS — I69315 Cognitive social or emotional deficit following cerebral infarction: Secondary | ICD-10-CM | POA: Diagnosis not present

## 2023-09-07 DIAGNOSIS — M6281 Muscle weakness (generalized): Secondary | ICD-10-CM | POA: Diagnosis not present

## 2023-09-07 DIAGNOSIS — M6259 Muscle wasting and atrophy, not elsewhere classified, multiple sites: Secondary | ICD-10-CM | POA: Diagnosis not present

## 2023-09-07 DIAGNOSIS — M24542 Contracture, left hand: Secondary | ICD-10-CM | POA: Diagnosis not present

## 2023-09-08 DIAGNOSIS — I69391 Dysphagia following cerebral infarction: Secondary | ICD-10-CM | POA: Diagnosis not present

## 2023-09-08 DIAGNOSIS — M6281 Muscle weakness (generalized): Secondary | ICD-10-CM | POA: Diagnosis not present

## 2023-09-08 DIAGNOSIS — M24542 Contracture, left hand: Secondary | ICD-10-CM | POA: Diagnosis not present

## 2023-09-08 DIAGNOSIS — M6259 Muscle wasting and atrophy, not elsewhere classified, multiple sites: Secondary | ICD-10-CM | POA: Diagnosis not present

## 2023-09-08 DIAGNOSIS — I69315 Cognitive social or emotional deficit following cerebral infarction: Secondary | ICD-10-CM | POA: Diagnosis not present

## 2023-09-09 DIAGNOSIS — G894 Chronic pain syndrome: Secondary | ICD-10-CM | POA: Diagnosis not present

## 2023-09-09 DIAGNOSIS — M24542 Contracture, left hand: Secondary | ICD-10-CM | POA: Diagnosis not present

## 2023-09-09 DIAGNOSIS — I69391 Dysphagia following cerebral infarction: Secondary | ICD-10-CM | POA: Diagnosis not present

## 2023-09-09 DIAGNOSIS — F1120 Opioid dependence, uncomplicated: Secondary | ICD-10-CM | POA: Diagnosis not present

## 2023-09-09 DIAGNOSIS — M6281 Muscle weakness (generalized): Secondary | ICD-10-CM | POA: Diagnosis not present

## 2023-09-09 DIAGNOSIS — M6259 Muscle wasting and atrophy, not elsewhere classified, multiple sites: Secondary | ICD-10-CM | POA: Diagnosis not present

## 2023-09-09 DIAGNOSIS — G40909 Epilepsy, unspecified, not intractable, without status epilepticus: Secondary | ICD-10-CM | POA: Diagnosis not present

## 2023-09-09 DIAGNOSIS — I69315 Cognitive social or emotional deficit following cerebral infarction: Secondary | ICD-10-CM | POA: Diagnosis not present

## 2023-09-10 DIAGNOSIS — I69315 Cognitive social or emotional deficit following cerebral infarction: Secondary | ICD-10-CM | POA: Diagnosis not present

## 2023-09-10 DIAGNOSIS — E119 Type 2 diabetes mellitus without complications: Secondary | ICD-10-CM | POA: Diagnosis not present

## 2023-09-10 DIAGNOSIS — D509 Iron deficiency anemia, unspecified: Secondary | ICD-10-CM | POA: Diagnosis not present

## 2023-09-10 DIAGNOSIS — M6281 Muscle weakness (generalized): Secondary | ICD-10-CM | POA: Diagnosis not present

## 2023-09-10 DIAGNOSIS — M24542 Contracture, left hand: Secondary | ICD-10-CM | POA: Diagnosis not present

## 2023-09-10 DIAGNOSIS — R799 Abnormal finding of blood chemistry, unspecified: Secondary | ICD-10-CM | POA: Diagnosis not present

## 2023-09-10 DIAGNOSIS — L89154 Pressure ulcer of sacral region, stage 4: Secondary | ICD-10-CM | POA: Diagnosis not present

## 2023-09-10 DIAGNOSIS — I69391 Dysphagia following cerebral infarction: Secondary | ICD-10-CM | POA: Diagnosis not present

## 2023-09-10 DIAGNOSIS — I5022 Chronic systolic (congestive) heart failure: Secondary | ICD-10-CM | POA: Diagnosis not present

## 2023-09-10 DIAGNOSIS — M6259 Muscle wasting and atrophy, not elsewhere classified, multiple sites: Secondary | ICD-10-CM | POA: Diagnosis not present

## 2023-09-11 DIAGNOSIS — I69315 Cognitive social or emotional deficit following cerebral infarction: Secondary | ICD-10-CM | POA: Diagnosis not present

## 2023-09-11 DIAGNOSIS — M6281 Muscle weakness (generalized): Secondary | ICD-10-CM | POA: Diagnosis not present

## 2023-09-11 DIAGNOSIS — I69391 Dysphagia following cerebral infarction: Secondary | ICD-10-CM | POA: Diagnosis not present

## 2023-09-11 DIAGNOSIS — M24542 Contracture, left hand: Secondary | ICD-10-CM | POA: Diagnosis not present

## 2023-09-11 DIAGNOSIS — M6259 Muscle wasting and atrophy, not elsewhere classified, multiple sites: Secondary | ICD-10-CM | POA: Diagnosis not present

## 2023-09-12 DIAGNOSIS — M6281 Muscle weakness (generalized): Secondary | ICD-10-CM | POA: Diagnosis not present

## 2023-09-12 DIAGNOSIS — I69315 Cognitive social or emotional deficit following cerebral infarction: Secondary | ICD-10-CM | POA: Diagnosis not present

## 2023-09-12 DIAGNOSIS — M6259 Muscle wasting and atrophy, not elsewhere classified, multiple sites: Secondary | ICD-10-CM | POA: Diagnosis not present

## 2023-09-12 DIAGNOSIS — I69391 Dysphagia following cerebral infarction: Secondary | ICD-10-CM | POA: Diagnosis not present

## 2023-09-12 DIAGNOSIS — M24542 Contracture, left hand: Secondary | ICD-10-CM | POA: Diagnosis not present

## 2023-09-15 DIAGNOSIS — M24542 Contracture, left hand: Secondary | ICD-10-CM | POA: Diagnosis not present

## 2023-09-15 DIAGNOSIS — M6281 Muscle weakness (generalized): Secondary | ICD-10-CM | POA: Diagnosis not present

## 2023-09-15 DIAGNOSIS — F331 Major depressive disorder, recurrent, moderate: Secondary | ICD-10-CM | POA: Diagnosis not present

## 2023-09-15 DIAGNOSIS — I69315 Cognitive social or emotional deficit following cerebral infarction: Secondary | ICD-10-CM | POA: Diagnosis not present

## 2023-09-15 DIAGNOSIS — M6259 Muscle wasting and atrophy, not elsewhere classified, multiple sites: Secondary | ICD-10-CM | POA: Diagnosis not present

## 2023-09-15 DIAGNOSIS — I69391 Dysphagia following cerebral infarction: Secondary | ICD-10-CM | POA: Diagnosis not present

## 2023-09-16 DIAGNOSIS — I10 Essential (primary) hypertension: Secondary | ICD-10-CM | POA: Diagnosis not present

## 2023-09-16 DIAGNOSIS — I63311 Cerebral infarction due to thrombosis of right middle cerebral artery: Secondary | ICD-10-CM | POA: Diagnosis not present

## 2023-09-16 DIAGNOSIS — I69315 Cognitive social or emotional deficit following cerebral infarction: Secondary | ICD-10-CM | POA: Diagnosis not present

## 2023-09-16 DIAGNOSIS — E119 Type 2 diabetes mellitus without complications: Secondary | ICD-10-CM | POA: Diagnosis not present

## 2023-09-16 DIAGNOSIS — M6259 Muscle wasting and atrophy, not elsewhere classified, multiple sites: Secondary | ICD-10-CM | POA: Diagnosis not present

## 2023-09-16 DIAGNOSIS — M24542 Contracture, left hand: Secondary | ICD-10-CM | POA: Diagnosis not present

## 2023-09-16 DIAGNOSIS — J449 Chronic obstructive pulmonary disease, unspecified: Secondary | ICD-10-CM | POA: Diagnosis not present

## 2023-09-16 DIAGNOSIS — M6281 Muscle weakness (generalized): Secondary | ICD-10-CM | POA: Diagnosis not present

## 2023-09-16 DIAGNOSIS — I69391 Dysphagia following cerebral infarction: Secondary | ICD-10-CM | POA: Diagnosis not present

## 2023-09-16 DIAGNOSIS — I4891 Unspecified atrial fibrillation: Secondary | ICD-10-CM | POA: Diagnosis not present

## 2023-09-16 DIAGNOSIS — I5022 Chronic systolic (congestive) heart failure: Secondary | ICD-10-CM | POA: Diagnosis not present

## 2023-09-16 DIAGNOSIS — G40909 Epilepsy, unspecified, not intractable, without status epilepticus: Secondary | ICD-10-CM | POA: Diagnosis not present

## 2023-09-16 DIAGNOSIS — D649 Anemia, unspecified: Secondary | ICD-10-CM | POA: Diagnosis not present

## 2023-09-17 DIAGNOSIS — I69391 Dysphagia following cerebral infarction: Secondary | ICD-10-CM | POA: Diagnosis not present

## 2023-09-17 DIAGNOSIS — I69315 Cognitive social or emotional deficit following cerebral infarction: Secondary | ICD-10-CM | POA: Diagnosis not present

## 2023-09-17 DIAGNOSIS — D509 Iron deficiency anemia, unspecified: Secondary | ICD-10-CM | POA: Diagnosis not present

## 2023-09-17 DIAGNOSIS — I5022 Chronic systolic (congestive) heart failure: Secondary | ICD-10-CM | POA: Diagnosis not present

## 2023-09-17 DIAGNOSIS — M24542 Contracture, left hand: Secondary | ICD-10-CM | POA: Diagnosis not present

## 2023-09-17 DIAGNOSIS — E119 Type 2 diabetes mellitus without complications: Secondary | ICD-10-CM | POA: Diagnosis not present

## 2023-09-17 DIAGNOSIS — M6281 Muscle weakness (generalized): Secondary | ICD-10-CM | POA: Diagnosis not present

## 2023-09-17 DIAGNOSIS — M6259 Muscle wasting and atrophy, not elsewhere classified, multiple sites: Secondary | ICD-10-CM | POA: Diagnosis not present

## 2023-09-17 DIAGNOSIS — L89154 Pressure ulcer of sacral region, stage 4: Secondary | ICD-10-CM | POA: Diagnosis not present

## 2023-09-18 ENCOUNTER — Ambulatory Visit: Admitting: Cardiology

## 2023-09-18 DIAGNOSIS — M6281 Muscle weakness (generalized): Secondary | ICD-10-CM | POA: Diagnosis not present

## 2023-09-18 DIAGNOSIS — M24542 Contracture, left hand: Secondary | ICD-10-CM | POA: Diagnosis not present

## 2023-09-18 DIAGNOSIS — I69315 Cognitive social or emotional deficit following cerebral infarction: Secondary | ICD-10-CM | POA: Diagnosis not present

## 2023-09-18 DIAGNOSIS — L89154 Pressure ulcer of sacral region, stage 4: Secondary | ICD-10-CM | POA: Diagnosis not present

## 2023-09-18 DIAGNOSIS — I69391 Dysphagia following cerebral infarction: Secondary | ICD-10-CM | POA: Diagnosis not present

## 2023-09-18 DIAGNOSIS — M6259 Muscle wasting and atrophy, not elsewhere classified, multiple sites: Secondary | ICD-10-CM | POA: Diagnosis not present

## 2023-09-19 DIAGNOSIS — I69391 Dysphagia following cerebral infarction: Secondary | ICD-10-CM | POA: Diagnosis not present

## 2023-09-19 DIAGNOSIS — M24542 Contracture, left hand: Secondary | ICD-10-CM | POA: Diagnosis not present

## 2023-09-19 DIAGNOSIS — M6281 Muscle weakness (generalized): Secondary | ICD-10-CM | POA: Diagnosis not present

## 2023-09-19 DIAGNOSIS — I69315 Cognitive social or emotional deficit following cerebral infarction: Secondary | ICD-10-CM | POA: Diagnosis not present

## 2023-09-19 DIAGNOSIS — M6259 Muscle wasting and atrophy, not elsewhere classified, multiple sites: Secondary | ICD-10-CM | POA: Diagnosis not present

## 2023-09-22 DIAGNOSIS — M24542 Contracture, left hand: Secondary | ICD-10-CM | POA: Diagnosis not present

## 2023-09-22 DIAGNOSIS — I69315 Cognitive social or emotional deficit following cerebral infarction: Secondary | ICD-10-CM | POA: Diagnosis not present

## 2023-09-22 DIAGNOSIS — M6259 Muscle wasting and atrophy, not elsewhere classified, multiple sites: Secondary | ICD-10-CM | POA: Diagnosis not present

## 2023-09-22 DIAGNOSIS — M6281 Muscle weakness (generalized): Secondary | ICD-10-CM | POA: Diagnosis not present

## 2023-09-22 DIAGNOSIS — I69391 Dysphagia following cerebral infarction: Secondary | ICD-10-CM | POA: Diagnosis not present

## 2023-09-22 NOTE — Progress Notes (Unsigned)
 Electrophysiology Office Note:   Date:  09/24/2023  ID:  Karen Tate, DOB [birth year], MRN [id number]  Primary Cardiologist: Alvan Carrier, MD Electrophysiologist: Fonda Kitty, MD      History of Present Illness:   Karen Tate is a 78 y.o. 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  Discussed the use of AI scribe software for clinical note transcription with the patient, who gave verbal consent to proceed.  History of Present Illness Karen Tate is a 78 year old female with atrial fibrillation who presents for evaluation for Watchman device implant.  She is accompanied by her 2 daughters.  Patient has baseline confusion, likely vascular dementia and is unable to provide any history.  All history provided from her daughters.   Unfortunately, patient had 2 strokes earlier this year.  After a second stroke on May 17, 2023, her mobility and cognitive function have been severely impaired. Prior to the stroke, she was about 50% mobile and experienced chronic pain. Since the second stroke, she has been unable to walk or engage in activities.she is now wheelchair and bedbound.  She is confused at all times and her mental status has not shown significant improvement. She has been off oral anticoagulation due to concerns for intracranial bleeding.    She also has a decubitus ulcer, which causes the patient a great deal of discomfort.   Review of systems complete and found to be negative unless listed in HPI.   EP Information / Studies Reviewed:    EKG not able to be performed today.  Patient unable to cooperate.  Known persistent atrial fibrillation.  Last ECG from February 10, 2023 with atrial fibrillation.[name] Echo 04/2023:  Left Ventricle: EF:  30-35%.    Left Ventricle: Hypokinetic anteroapical segments.  Basal sparing.    Mitral Valve: There is mild to moderate regurgitation.    Aortic Valve: Trace aortic valve regurgitation.   Risk Assessment/Calculations:    CHA2DS2-VASc Score = 8   This indicates a 10.8% annual risk of stroke. The patient's score is based upon: CHF History: 1 HTN History: 1 Diabetes History: 0 Stroke History: 2 Vascular Disease History: 1 Age Score: 2 Gender Score: 1     Physical Exam:   VS:  BP (!) 145/67   Pulse 67   Ht 5' 5 (1.651 m)   SpO2 94%   BMI 30.12 kg/m    Wt Readings from Last 3 Encounters:  04/29/23 181 lb (82.1 kg)  02/10/23 181 lb 9.6 oz (82.4 kg)  08/09/22 179 lb (81.2 kg)     GEN: Chronically ill-appearing, in wheelchair, confused and intermittently agitated NECK: No JVD CARDIAC: Normal rate, irregular rhythm RESPIRATORY:  Clear to auscultation without rales, wheezing or rhonchi  ABDOMEN: Soft, non-distended EXTREMITIES: Contracted lower extremities  ASSESSMENT AND PLAN:   Permanent Atrial Fibrillation (ICD10:  I48.11) Secondary Hypercoagulable State (ICD10:  D68.69) I have seen [name] in the office today who is being considered for a Watchman left atrial appendage closure device.  Unfortunately, the patient has clinically declined significantly since her last stroke.  She is now wheelchair-bound/bedbound.  She has developed a large sacral decubitus ulcer.  Her mental state has not recovered since her last stroke and she is completely altered at baseline.  After discussion of Watchman procedure with patient's daughters, they are in agreement that procedural risk would not lead to any increased quality of life.  They would like to avoid any suffering or procedural intervention at this time.  I think this is reasonable.  They are understanding that patient is at risk for recurrent stroke off anticoagulation and that anticoagulation would increase her risk of intracranial  bleed.  Patient will remain off anticoagulation at the request of neurology.  If neurology clears, then okay to resume anticoagulation if family agrees.  Follow up with EP Team as needed.   Signed, Fonda Kitty, MD

## 2023-09-23 ENCOUNTER — Encounter: Payer: Self-pay | Admitting: Cardiology

## 2023-09-23 ENCOUNTER — Ambulatory Visit: Attending: Cardiology | Admitting: Cardiology

## 2023-09-23 ENCOUNTER — Encounter (HOSPITAL_COMMUNITY): Payer: Self-pay | Admitting: Hematology

## 2023-09-23 VITALS — BP 145/67 | HR 67 | Ht 65.0 in

## 2023-09-23 DIAGNOSIS — D6869 Other thrombophilia: Secondary | ICD-10-CM | POA: Diagnosis not present

## 2023-09-23 DIAGNOSIS — I4821 Permanent atrial fibrillation: Secondary | ICD-10-CM | POA: Insufficient documentation

## 2023-09-23 NOTE — Patient Instructions (Signed)

## 2023-09-24 ENCOUNTER — Encounter (HOSPITAL_COMMUNITY): Payer: Self-pay | Admitting: Hematology

## 2023-09-30 ENCOUNTER — Encounter (HOSPITAL_COMMUNITY): Payer: Self-pay | Admitting: Hematology

## 2023-11-07 ENCOUNTER — Telehealth: Payer: Self-pay | Admitting: Cardiology

## 2023-11-07 NOTE — Telephone Encounter (Signed)
 Patient is in a facility. Family will contact office if they can bring her in

## 2023-12-09 ENCOUNTER — Other Ambulatory Visit: Payer: Self-pay | Admitting: *Deleted

## 2024-01-28 ENCOUNTER — Encounter (INDEPENDENT_AMBULATORY_CARE_PROVIDER_SITE_OTHER): Payer: Self-pay | Admitting: Gastroenterology

## 2024-02-10 ENCOUNTER — Telehealth: Payer: Self-pay

## 2024-02-10 NOTE — Telephone Encounter (Signed)
 Received oxygen  renewal request from [employer] via fax. Pt has not been seen since 02/25 and we do not have her oxygen  qualification in her chart. Pt will need OV to requalify.  ATC pt, however the phone number is out of service.

## 2024-03-31 ENCOUNTER — Encounter: Payer: Self-pay | Admitting: Internal Medicine
